# Patient Record
Sex: Male | Born: 1990
Health system: Southern US, Community
[De-identification: ages and names within clinical notes are randomized; demographics above are authoritative.]

## PROBLEM LIST (undated history)

## (undated) DIAGNOSIS — K219 Gastro-esophageal reflux disease without esophagitis: Secondary | ICD-10-CM

## (undated) DIAGNOSIS — I82409 Acute embolism and thrombosis of unspecified deep veins of unspecified lower extremity: Secondary | ICD-10-CM

## (undated) DIAGNOSIS — J45909 Unspecified asthma, uncomplicated: Secondary | ICD-10-CM

## (undated) DIAGNOSIS — C801 Malignant (primary) neoplasm, unspecified: Secondary | ICD-10-CM

## (undated) HISTORY — PX: IVC FILTER INSERTION: CATH118245

## (undated) HISTORY — DX: Unspecified asthma, uncomplicated: J45.909

## (undated) NOTE — *Deleted (*Deleted)
[facility]  [address] [place], [place] [address] Phone: [phone]  Fax: [phone]   Clinic Day:  02/06/2020  Referring physician: No ref. provider found  Chief Complaint: Edgar [name] is a 29 y.o. male with recurrent testicular cancer who is seen for 3 month assessment.  HPI: The patient was last seen in the medical oncology clinic on 11/08/2019. At that time, he denied any complaints.  Exam was unremarkable. Hematocrit was 41.5, hemoglobin 13.4, platelets 301,000, WBC 6,100. Creatinine was 1.27. Beta-HCG quant was 3. AFP was 1.1. LDH was 189. He continued Xarelto.  As his HCG, LDH, and creatinine were higher than baseline, he was to have his labs rechecked in 2 weeks.  Chest, abdomen and pelvis CT with contrast on 02/06/2020 revealed ***.  During the interim, ***   Past Medical History:  Diagnosis Date  . Asthma    AS A CHILD-NO INHALERS  . Cancer (HCC)    testicular cancer 11/2016 per pt   . DVT (deep venous thrombosis) (HCC)   . GERD (gastroesophageal reflux disease)    TUMS PRN    Past Surgical History:  Procedure Laterality Date  . ANKLE FRACTURE SURGERY Right 2004  . FINE NEEDLE ASPIRATION BIOPSY N/A 11/09/2017   Procedure: FINE NEEDLE ASPIRATION BIOPSY;  Surgeon: Renford Dills, MD;  Location: [facility] INVASIVE CV LAB;  Service: Cardiovascular;  Laterality: N/A;  . IVC FILTER INSERTION    . IVC FILTER INSERTION N/A 04/12/2018   Procedure: IVC FILTER INSERTION;  Surgeon: Renford Dills, MD;  Location: [facility] INVASIVE CV LAB;  Service: Cardiovascular;  Laterality: N/A;  . IVC FILTER REMOVAL N/A 03/23/2018   Procedure: IVC FILTER REMOVAL;  Surgeon: Renford Dills, MD;  Location: [facility] INVASIVE CV LAB;  Service: Cardiovascular;  Laterality: N/A;  . ORCHIECTOMY Left 11/26/2015   Procedure: ORCHIECTOMY;  Surgeon: Vanna Scotland, MD;  Location: [facility] ORS;  Service: Urology;  Laterality: Left;  radical/Inguinal approach  .  PERIPHERAL VASCULAR CATHETERIZATION N/A 12/16/2015   Procedure: Shelda Pal Cath Insertion;  Surgeon: Annice Needy, MD;  Location: [facility] INVASIVE CV LAB;  Service: Cardiovascular;  Laterality: N/A;  . PERIPHERAL VASCULAR THROMBECTOMY Left 10/13/2017   Procedure: PERIPHERAL VASCULAR THROMBECTOMY;  Surgeon: Renford Dills, MD;  Location: [facility] INVASIVE CV LAB;  Service: Cardiovascular;  Laterality: Left;  . WISDOM TOOTH EXTRACTION  2017    Family History  Problem Relation Age of Onset  . Skin cancer Mother   . Breast cancer Maternal Grandmother   . Colon cancer Maternal Grandfather   . Diabetes Maternal Grandfather   . Emphysema Father   . Mental illness Sister        anxiety  . Stroke Paternal Grandmother   . Prostate cancer Neg Hx   . Kidney cancer Neg Hx   . Bladder Cancer Neg Hx     Social History:  reports that he has been smoking cigarettes. He has a 4.00 pack-year smoking history. He has quit using smokeless tobacco.  His smokeless tobacco use included snuff. He reports previous drug use. Drug: Marijuana. He reports that he does not drink alcohol. He smokes 1/2 pack a day. He smokes marijuana. 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 His daughter is home schooled. The patient's mother's cell phone number is [phone]. Another contact number is [phone]. The patient lives in [place]. He started working at [employer] 40 hours a week.  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  The patient is alone*** today.  Allergies: No Known Allergies  Current Medications: Current Outpatient Medications  Medication Sig Dispense Refill  . acetaminophen (TYLENOL) 325 MG tablet Take 650 mg by mouth every 6 (six) hours as needed.    . gabapentin (NEURONTIN) 300 MG capsule TAKE 1 CAPSULE BY MOUTH EVERYDAY AT BEDTIME 30 capsule 2  . rivaroxaban (XARELTO)  20 MG TABS tablet Take 1 tablet (20 mg total) by mouth daily with supper. 30 tablet 2  . XARELTO 20 MG TABS tablet TAKE 1 TABLET BY MOUTH EVERY DAY (Patient not taking: Reported on 11/07/2019) 30 tablet 0   No current facility-administered medications for this visit.   Facility-Administered Medications Ordered in Other Visits  Medication Dose Route Frequency Provider Last Rate Last Admin  . filgrastim-sndz (ZARXIO) injection 480 mcg  480 mcg Subcutaneous Once Rosey Bath, MD      . Seward Speck [facility]) injection 480 mcg  480 mcg Subcutaneous Once Rosey Bath, MD       Review of Systems  Constitutional: Negative.  Negative for chills, diaphoresis, fever, malaise/fatigue and weight loss (up 22 lbs in past 6 months).  HENT: Negative.  Negative for congestion, ear discharge, ear pain, hearing loss, nosebleeds, sinus pain, sore throat and tinnitus.   Eyes: Negative.  Negative for blurred vision, double vision, pain, discharge and redness.  Respiratory: Negative.  Negative for cough, hemoptysis, sputum production and shortness of breath.   Cardiovascular: Positive for leg swelling (stable). Negative for chest pain, palpitations, orthopnea and PND.  Gastrointestinal: Negative.  Negative for abdominal pain, blood in stool, constipation, diarrhea, heartburn, melena, nausea and vomiting.       Eating well.  Genitourinary: Negative.  Negative for dysuria, frequency, hematuria and urgency.  Musculoskeletal: Negative.  Negative for back pain, falls, joint pain, myalgias and neck pain.  Skin: Negative.  Negative for itching and rash.  Neurological: Positive for sensory change (neuropathy in feet, stable). Negative for dizziness, tingling, speech change, focal weakness, weakness and headaches.  Endo/Heme/Allergies: Does not bruise/bleed easily.  Psychiatric/Behavioral: Negative.  Negative for depression, memory loss and suicidal ideas. The patient is not nervous/anxious and does not have  insomnia.   All other systems reviewed and are negative.  Performance status (ECOG):  0***  Vitals There were no vitals taken for this visit.   Physical Exam Vitals and nursing note reviewed.  Constitutional:      General: He is not in acute distress.    Appearance: Normal appearance. He is well-developed. He is not diaphoretic.  HENT:     Head: Normocephalic and atraumatic.     Comments: Brown hair.    Mouth/Throat:     Mouth: Mucous membranes are moist.     Pharynx: Oropharynx is clear.  Eyes:     General: No scleral icterus.    Extraocular Movements: Extraocular movements intact.     Conjunctiva/sclera: Conjunctivae normal.     Pupils: Pupils are equal, round, and reactive to light.  Cardiovascular:     Rate and Rhythm: Normal rate and regular rhythm.     Heart sounds: Normal heart sounds. No murmur heard.   Pulmonary:     Effort: Pulmonary effort is normal. No respiratory distress.     Breath sounds: Normal breath sounds. No stridor. No wheezing or rales.  Chest:     Chest wall: No tenderness.  Abdominal:     General: Bowel sounds are normal. There is no distension.     Palpations: Abdomen is soft. There is no mass.     Tenderness: There is no abdominal tenderness. There is no guarding or rebound.  Genitourinary:    Testes:        Right: Mass or swelling not present.     Comments: Left testes absent. Musculoskeletal:        General: No swelling or tenderness. Normal range of motion.     Cervical back: Normal range of motion and neck supple.  Lymphadenopathy:     Head:     Right side of head: No preauricular, posterior auricular or occipital adenopathy.     Left side of head: No preauricular, posterior auricular or occipital adenopathy.     Cervical: No cervical adenopathy.     Upper Body:     Right upper body: No supraclavicular or axillary adenopathy.     Left upper body: No supraclavicular or axillary adenopathy.     Lower Body: No right inguinal adenopathy.  No left inguinal adenopathy.  Skin:    General: Skin is warm and dry.     Coloration: Skin is not pale.  Neurological:     Mental Status: He is alert and oriented to person, place, and time. Mental status is at baseline.  Psychiatric:        Behavior: Behavior normal.        Thought Content: Thought content normal.        Judgment: Judgment normal.    No visits with results within 3 Day(s) from this visit.  Latest known visit with results is:  Infusion on 11/07/2019  Component Date Value Ref Range Status  . WBC 11/07/2019 6.1  4.0 - 10.5 K/uL Final  . RBC 11/07/2019 4.42  4.22 - 5.81 MIL/uL Final  . Hemoglobin 11/07/2019 13.4  13.0 - 17.0 g/dL Final  . HCT 16/12/9602 41.5  39 - 52 % Final  . MCV 11/07/2019 93.9  80.0 - 100.0 fL Final  . MCH 11/07/2019 30.3  26.0 - 34.0 pg Final  . MCHC 11/07/2019 32.3  30.0 - 36.0 g/dL Final  . RDW 54/11/8117 13.1  11.5 - 15.5 % Final  . Platelets 11/07/2019 301  150 - 400 K/uL Final  . nRBC 11/07/2019 0.0  0.0 - 0.2 % Final  . Neutrophils Relative % 11/07/2019 54  % Final  . Neutro Abs 11/07/2019 3.3  1.7 - 7.7 K/uL Final  . Lymphocytes Relative 11/07/2019 27  % Final  . Lymphs Abs 11/07/2019 1.6  0.7 - 4.0 K/uL Final  . Monocytes Relative 11/07/2019 11  % Final  . Monocytes Absolute 11/07/2019 0.7  0.1 - 1.0 K/uL Final  . Eosinophils Relative 11/07/2019 7  % Final  . Eosinophils Absolute 11/07/2019 0.4  0.0 - 0.5 K/uL Final  . Basophils Relative 11/07/2019 1  % Final  . Basophils Absolute 11/07/2019 0.1  0.0 - 0.1 K/uL Final  . Immature Granulocytes 11/07/2019 0  % Final  . Abs Immature Granulocytes 11/07/2019 0.02  0.00 - 0.07 K/uL Final   Performed at [facility], [address], [place], [place] [address]  . hCG Quant 11/07/2019 3  0 - 3 mIU/mL Final   Comment: (NOTE) Roche ECLIA methodology Performed At: [facility] [address] [place], [place] [id number] Jolene Schimke MD VH:[id number]   . AFP, Serum,  Tumor Marker 11/07/2019 1.1  0.0 - 8.3 ng/mL Final  Comment: (NOTE) Roche Diagnostics Electrochemiluminescence Immunoassay (ECLIA) Values obtained with different assay methods or kits cannot be used interchangeably.  Results cannot be interpreted as absolute evidence of the presence or absence of malignant disease. This test is not interpretable in pregnant females. Performed At: [facility] [address] [place], [place] [id number] Jolene Schimke MD WU:[id number]   . LDH 11/07/2019 189  98 - 192 U/L Final   Performed at [facility], [address] [place], [place] [address]  . Sodium 11/07/2019 140  135 - 145 mmol/L Final  . Potassium 11/07/2019 3.7  3.5 - 5.1 mmol/L Final  . Chloride 11/07/2019 104  98 - 111 mmol/L Final  . CO2 11/07/2019 26  22 - 32 mmol/L Final  . Glucose, Bld 11/07/2019 118* 70 - 99 mg/dL Final   Glucose reference range applies only to samples taken after fasting for at least 8 hours.  . BUN 11/07/2019 16  6 - 20 mg/dL Final  . Creatinine, Ser 11/07/2019 1.27* 0.61 - 1.24 mg/dL Final  . Calcium 30/86/5784 8.9  8.9 - 10.3 mg/dL Final  . Total Protein 11/07/2019 7.5  6.5 - 8.1 g/dL Final  . Albumin 69/62/9528 4.6  3.5 - 5.0 g/dL Final  . AST 41/32/4401 20  15 - 41 U/L Final  . ALT 11/07/2019 25  0 - 44 U/L Final  . Alkaline Phosphatase 11/07/2019 54  38 - 126 U/L Final  . Total Bilirubin 11/07/2019 0.8  0.3 - 1.2 mg/dL Final  . GFR calc non Af Amer 11/07/2019 >60  >60 mL/min Final  . GFR calc Af Amer 11/07/2019 >60  >60 mL/min Final  . Anion gap 11/07/2019 10  5 - 15 Final   Performed at [facility], [address], [place], [place] [address]    Assessment:  Edgar [name] is a 70 y.o. male with recurrent testicular cancer. He presented with a left lower extremity DVT on 09/10/2017.  He initially presented withstage IIB left testicular cancers/p left radical orchiectomy on 11/26/2015. 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 Clinical/pathologic stage is T1 N1-2 S0 M0.  He received 3 cycles of BEP(12/30/2015 - 02/24/2016). He received OnPro Neulasta with cycle #2 and #3. He declined evaluation for retroperitoneal lymph node dissection (RPLND). He declined sperm banking.  Pre surgery labson 11/20/2015 revealed an AFP of 411.9, beta-HCG 2,669, and LDH 522 (121-224).   Tumor markers have been followed: AFP was 411.9 (0-8.3) on 11/20/2015, 11.2 on 12/19/2015, 6.3 on 12/23/2015, 1.3 on 01/27/2016, 1.8 on 02/17/2016, 1.9 on 03/30/2016, 1.4 on 05/27/2016, 1.0 on 07/29/2016, 1.4 on 10/14/2016, 3.3 on 12/24/2016, 1 on 09/10/2017, 1 on 09/20/2017, 2.6 on 10/21/2017, 2.1 on 11/16/2017, 2.1 on 12/20/2017, 2.1 on 02/04/2018, 1.3 on 10/05/2018, 1.2 on 01/30/2019, 1.2 on 05/08/2019, 1.4 on 08/07/2019, and 1.1 on 11/07/2019.  Beta-HCGwas 2,669 (0-3) on 11/20/2015, 2 on 12/19/2015, 1 on 12/23/2015, <1 on 01/27/2016, <1 on 02/17/2016, <1 on 03/30/2016, <1 on 05/27/2016, <1 on 07/29/2016, <1 on 10/14/2016, and 3 on 12/24/2016, <5 on 09/11/2017, <5 on 09/20/2017, <1 on 10/21/2017, <1 on 11/16/2017, <1 on 12/20/2017, <1 on 02/04/2018, < 1 on 10/05/2018, < 1 on 01/30/2019, < 1 on 05/08/2019, < 1 on 08/07/2019, and 3 on 11/07/2019.  LDHwas 522 (98-192) on 11/20/2015, 179 on 12/19/2015, 160 on 12/23/2015, 202 on 01/27/2016, 156 on 02/10/2016, 161 on 02/17/2016, 172 on 03/30/2016,  178 on 05/27/2016, 150 on 07/29/2016, 152 on 10/14/2016, 145 on 12/24/2016, 1557 on 09/11/2017, 916 on 09/20/2017, 144 on 10/21/2017, 158 on 11/16/2017, 203 on 12/20/2017, 151 on 12/27/2017, 133 on 02/04/2018,130 on 10/05/2018, 129 on 01/30/2019, 147 on 05/08/2019, 142 on 08/07/2019, 189 on 11/07/2019, and 144 on 02/06/2020.  Chest, abdomen, and pelvic CTon 09/11/2017 revealed enlarged and morphologically  abnormal 4.3 x 4.9 cm left pelvic sidewall and retroperitoneal lymph nodes (measuring up to 2.9 cm). 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 There was anasarca/swelling of the left lower extremity. There was no evidence of metastatic disease in the chest.  Head MRIon 09/13/2017 revealed no evidence of metastatic disease.  He has a left lower extremity DVT. Bilateral lower extremity duplexon 09/10/2017 revealed evidence of obstruction in the left CFV, SFJ, and proximal femoral vein. He is on Xarelto.  He received 4 cycles of TIPchemotherapy(09/21/2017 - 12/27/2017). Cycle #1 was administered at [facility]. Cycles #2 and 3 were administered at [facility]. Both cycles 1 and 2 were complicated by lower extremity skin lesions on recovery of counts. 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 Patient received daily Zarxioinjections from 01/03/2018 - 01/12/2018.  PET scanon 02/03/2018 revealed left common and external iliac adenopathywithonly low-grade activity, less than that of the blood pool. The dominant lymph node is an external iliac node measuring 1.8 x 2.4cm(previously 2.3x 3.2cm).There was no new or enlarging adenopathy.There was low-grade activity along the left inguinal ring region near the orchectomy site, thought to be postoperative and not due to local malignancy.  He underwentrobotic assisted laparoscopic retroperitoneal lymph node dissectionand robotic inguinofemoral lymph node dissection on 03/05/2020at [facility]. Pathologyrevealed a left common iliac node with a large necrotic tumor deposit consistent with treatment effect (no residual viable tumor). There were 3 interaortocaval lymph nodes,9 paracaval lymph nodes, and3 periaortic lymph nodes negative for malignancy.   Chest, abdomen, and pelvis CT with contrast on 02/07/2019 revealed no acute intrathoracic, abdominal, or pelvic pathology.  There was no evidence of metastatic disease in the chest.  There was a single enlarged left external iliac chain lymph node similar in  size to the prior CT with no other adenopathy. There was interval resolution of the postoperative changes of the left groin with linear scarring with no fluid collection.  Chest, abdomen, and pelvis CT with on 08/07/2019 revealed no evidence of metastatic disease in the chest, abdomen or pelvis. The previously described left pelvic sidewall fluid density focus had resolved.  Audiogramon 10/27/2017 revealed hearing within normal limits.  He underwent IVC filterplacement, percutaneous angioplastyand stentplacementof the left common iliac vein and left external iliac vein on 10/13/2017.He underwent an IVC filter removalon 01/08/2020and replacementon 04/12/2018. He remains on Xarelto.  Hypercoagulable work-upon 10/12/2017 negative for the following:Factor V Leiden, prothrombin gene mutation,lupus anticoagulant, anticardiolipin antibodies, beta-2 glycoprotein, protein C activity/antigen, protein S activity/antigen. ATIIIwas54% (75-120) on heparin.  Symptomatically, ***  Plan: 1.   Review labs from 02/06/2020.   2.Recurrent testicular cancer Patient completed 4 cycles of TIP chemotherapy.  He underwent retroperitoneal lymph node dissection on03/07/2018. He had 1 necrotic lymph node with no viable tumor. Chest, abdomen, pelvic CT scan on 08/07/2019 was personally reviewed.  Agree with radiology interpretation.    No evidence of recurrent disease.             AFP, beta-HCG, and LDH are normal.   HCG has increased from <  1 to 3 (ULN).  Discuss ongoing follow-up:   Year 2: H&P and markers every 3 months and scans every 6 months.  Schedule follow-up CT scans in 01/2020. 3. LEFT lower extremity  DVT Patient is as/preplacement of the IVC filter. He is at risk for thrombosis. Continue Xarelto. 4.Chemotherapy-induced neuropathy Patient has a stable neuropathy in his feet. Continue gabapentin. 5.   Chest, abdomen and pelvic CT scans on 02/06/2020. 6.   RTC on 11/23 as scheduled for labs. 7.   RTC on 02/07/2020 for MD assessment as scheduled.  Addendum: Patient will have his creatinine and tumor markers rechecked in 2 weeks (11/22/2019).  I discussed the assessment and treatment plan with the patient.  The patient was provided an opportunity to ask questions and all were answered.  The patient agreed with the plan and demonstrated an understanding of the instructions.  The patient was advised to call back if the symptoms worsen or if the condition fails to improve as anticipated.  I provided *** minutes of face-to-face time during this this encounter and > 50% was spent counseling as documented under my assessment and plan.  Rosey Bath, MD, PhD 11/07/2019, 5:00PM  I, Pietro Cassis, am acting as Neurosurgeon for [employer]. Merlene Pulling, MD, PhD.  I, [name]. Merlene Pulling, MD, have reviewed the above documentation for accuracy and completeness, and I agree with the above.

---
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
## 2010-12-18 ENCOUNTER — Emergency Department: Payer: Self-pay | Admitting: Emergency Medicine

## 2015-03-17 HISTORY — PX: WISDOM TOOTH EXTRACTION: SHX21

## 2015-11-20 ENCOUNTER — Ambulatory Visit
Admission: RE | Admit: 2015-11-20 | Discharge: 2015-11-20 | Disposition: A | Discharge status: Home / Self Care | Payer: BLUE CROSS/BLUE SHIELD | Source: Ambulatory Visit | Attending: Family Medicine | Admitting: Family Medicine

## 2015-11-20 ENCOUNTER — Ambulatory Visit (INDEPENDENT_AMBULATORY_CARE_PROVIDER_SITE_OTHER): Payer: BLUE CROSS/BLUE SHIELD | Admitting: Urology

## 2015-11-20 ENCOUNTER — Other Ambulatory Visit: Payer: Self-pay | Admitting: Family Medicine

## 2015-11-20 ENCOUNTER — Encounter: Payer: Self-pay | Admitting: Urology

## 2015-11-20 VITALS — BP 131/82 | HR 96 | Ht 75.0 in | Wt 272.0 lb

## 2015-11-20 DIAGNOSIS — N433 Hydrocele, unspecified: Secondary | ICD-10-CM | POA: Diagnosis not present

## 2015-11-20 DIAGNOSIS — N509 Disorder of male genital organs, unspecified: Secondary | ICD-10-CM

## 2015-11-20 DIAGNOSIS — N448 Other noninflammatory disorders of the testis: Secondary | ICD-10-CM | POA: Diagnosis not present

## 2015-11-20 DIAGNOSIS — N50812 Left testicular pain: Secondary | ICD-10-CM

## 2015-11-20 DIAGNOSIS — N50819 Testicular pain, unspecified: Secondary | ICD-10-CM

## 2015-11-20 DIAGNOSIS — R3129 Other microscopic hematuria: Secondary | ICD-10-CM | POA: Diagnosis not present

## 2015-11-20 LAB — URINALYSIS, COMPLETE
Bilirubin, UA: NEGATIVE
Glucose, UA: NEGATIVE
Ketones, UA: NEGATIVE
Leukocytes, UA: NEGATIVE
NITRITE UA: NEGATIVE
PH UA: 5.5 (ref 5.0–7.5)
Protein, UA: NEGATIVE
Specific Gravity, UA: >1.03 — ABNORMAL HIGH (ref 1.005–1.030)
UUROB: 0.2 mg/dL (ref 0.2–1.0)

## 2015-11-20 LAB — MICROSCOPIC EXAMINATION: Bacteria, UA: NONE SEEN

## 2015-11-20 MED ORDER — DOXYCYCLINE HYCLATE 100 MG PO CAPS: 100.0000 mg | ORAL_CAPSULE | Freq: Two times a day (BID) | ORAL | 0 refills | Status: DC

## 2015-11-20 MED ORDER — HYDROCODONE-ACETAMINOPHEN 5-325 MG PO TABS: 1.0000 | ORAL_TABLET | Freq: Four times a day (QID) | ORAL | Status: DC | PRN

## 2015-11-20 MED ORDER — CEFTRIAXONE SODIUM 1 G IJ SOLR
1.0000 g | Freq: Once | INTRAMUSCULAR | Status: AC
  Administered 2015-11-20: 1 g via INTRAMUSCULAR

## 2015-11-20 NOTE — Progress Notes (Addendum)
11/20/2015 2:07 PM   Edgar [name] 1990-04-14 [id number]  Referring provider: No referring provider defined for this encounter.  Chief Complaint  Patient presents with  . Testicle Pain    New Patient    HPI: 25 yo M presents today as referral with pain in his left testicle x 2 weeks.  He reports that the pain continue to worsen.  No associated nausea or vomiting.  He does have some lower abdominal pain.  Pain is constant.  He has taken 2 hydrocodone which did help some.    No associated urinary issues.  No dysuria or gross hematuria. He is sexually active.  No urethral discharge.  Denies history of STIs.   No fevers or chills.  This happened earlier this year around March, lasted 2 days and then resolved.   Scrotal ultrasound shows bilateral hydroceles. Left testicle heterogeneous and enlarged.  No family history of testicular cancer.     PMH: Past Medical History:  Diagnosis Date  . Asthma     Surgical History: Past Surgical History:  Procedure Laterality Date  . ANKLE FRACTURE SURGERY Right 2004    Home Medications:    Medication List       Accurate as of 11/20/15  2:07 PM. Always use your most recent med list.          doxycycline 100 MG capsule Commonly known as:  VIBRAMYCIN Take 1 capsule (100 mg total) by mouth every 12 (twelve) hours.       Allergies: No Known Allergies  Family History: Family History  Problem Relation Age of Onset  . Prostate cancer Neg Hx   . Kidney cancer Neg Hx   . Bladder Cancer Neg Hx     Social History:  reports that he has been smoking Cigarettes.  He has been smoking about 0.50 packs per day. He has quit using smokeless tobacco. His smokeless tobacco use included Snuff. He reports that he uses drugs, including Marijuana. He reports that he does not drink alcohol.  ROS: UROLOGY Frequent Urination?: Yes Hard to postpone urination?: No Burning/pain with urination?: No Get up at night to urinate?: No Leakage  of urine?: No Urine stream starts and stops?: No Trouble starting stream?: No Do you have to strain to urinate?: No Blood in urine?: No Urinary tract infection?: No Sexually transmitted disease?: No Injury to kidneys or bladder?: No Painful intercourse?: No Weak stream?: No Erection problems?: No Penile pain?: No  Gastrointestinal Nausea?: No Vomiting?: No Indigestion/heartburn?: No Diarrhea?: No Constipation?: No  Constitutional Fever: No Night sweats?: Yes Weight loss?: No Fatigue?: Yes  Skin Skin rash/lesions?: No Itching?: No  Eyes Blurred vision?: No Double vision?: No  Ears/Nose/Throat Sore throat?: No Sinus problems?: No  Hematologic/Lymphatic Swollen glands?: No Easy bruising?: No  Cardiovascular Leg swelling?: No Chest pain?: No  Respiratory Cough?: No Shortness of breath?: No  Endocrine Excessive thirst?: Yes  Musculoskeletal Back pain?: No Joint pain?: No  Neurological Headaches?: Yes Dizziness?: No  Psychologic Depression?: No Anxiety?: No  Physical Exam: BP 131/82   Pulse 96   Ht 6' 3"  (1.905 m)   Wt 272 lb (123.4 kg)   BMI 34.00 kg/m   Constitutional:  Alert and oriented, No acute distress.  Smell of cigarettes. HEENT: [place] AT, moist mucus membranes.  Trachea midline, no masses. Cardiovascular: No clubbing, cyanosis, or edema. Respiratory: Normal respiratory effort, no increased work of breathing. GI: Abdomen is soft, nontender, nondistended, no abdominal masses GU: Circumcised phallus. Softball sized left  hydrocele with some slight erythema of the overlying skin without induration or fluctuance. Unable to palpate left testicle. Right testicle with small hydrocele, palpable without tenderness or masses. Skin: No rashes, bruises or suspicious lesions. Lymph: No cervical or inguinal adenopathy. Neurologic: Grossly intact, no focal deficits, moving all 4 extremities. Psychiatric: Normal mood and affect.  Laboratory  Data: Basic Metabolic Panel (BMP) (47/65/4650 8:57 AM) Basic Metabolic Panel (BMP) (35/46/5681 8:57 AM)  Component Value Ref Range  Glucose 111 (H) 70 - 110 mg/dL  Sodium 139 136 - 145 mmol/L  Potassium 3.9 3.6 - 5.1 mmol/L  Chloride 105 97 - 109 mmol/L  Carbon Dioxide (CO2) 27.1 22.0 - 32.0 mmol/L  Calcium 9.7 8.7 - 10.3 mg/dL  Urea Nitrogen (BUN) 12 7 - 25 mg/dL  Creatinine 0.9 0.7 - 1.3 mg/dL  Glomerular Filtration Rate (eGFR), MDRD Estimate 101 >60 mL/min/1.73sq m  BUN/Crea Ratio 13.3 6.0 - 20.0  Anion Gap w/K 10.8 6.0 - 16.0    CBC w/auto Differential (5 Part) (11/20/2015 8:57 AM) CBC w/auto Differential (5 Part) (11/20/2015 8:57 AM)  Component Value Ref Range  WBC (White Blood Cell Count) 7.7 4.1 - 10.2 10^3/uL  RBC (Red Blood Cell Count) 5.29 4.69 - 6.13 10^6/uL  Hemoglobin 15.7 14.1 - 18.1 gm/dL  Hematocrit 47.7 40.0 - 52.0 %  MCV (Mean Corpuscular Volume) 90.2 80.0 - 100.0 fl  MCH (Mean Corpuscular Hemoglobin) 29.7 27.0 - 31.2 pg  MCHC (Mean Corpuscular Hemoglobin Concentration) 32.9 32.0 - 36.0 gm/dL  Platelet Count 378 150 - 450 10^3/uL  RDW-CV (Red Cell Distribution Width) 12.6 11.6 - 14.8 %  MPV (Mean Platelet Volume) 8.8 (L) 9.4 - 12.4 fl  Neutrophils 4.63 1.50 - 7.80 10^3/uL  Lymphocytes 1.76 1.00 - 3.60 10^3/uL  Monocytes 1.03 0.00 - 1.50 10^3/uL  Eosinophils 0.26 0.00 - 0.55 10^3/uL  Basophils 0.03 0.00 - 0.09 10^3/uL  Neutrophil % 59.8 32.0 - 70.0 %  Lymphocyte % 22.8 10.0 - 50.0 %  Monocyte % 13.3 (H) 4.0 - 13.0 %  Eosinophil % 3.4 1.0 - 5.0 %  Basophil% 0.4 0.0 - 2.0 %  Immature Granulocyte % 0.3 <=0.7 %  Immature Granulocyte Count 0.02 <=0.06 10^3/L    Urinalysis UA today shows 3-10 blood cells per high-power field, calcium oxalate crystals, otherwise negative.  Pertinent Imaging: CLINICAL DATA:  Testicular swelling.  EXAM: SCROTAL ULTRASOUND  DOPPLER ULTRASOUND OF THE TESTICLES  TECHNIQUE: Complete ultrasound examination of the  testicles, epididymis, and other scrotal structures was performed. Color and spectral Doppler ultrasound were also utilized to evaluate blood flow to the testicles.  COMPARISON:  CT 02/27/2007.  FINDINGS: Right testicle  Measurements: 4.0 x 2.0 x 2.7 cm. No mass or microlithiasis visualized.  Left testicle  Measurements: 10.5 x 6.9 x 5.9 cm. Left testicle is enlarged with a heterogeneous echotexture with increased color flow. Orchitis could present in this fashion. Diffuse left testicular tumor cannot be excluded.  Right epididymis:  4 mm epididymal cyst.  Left epididymis: Mild prominence of the left epididymis is noted. Heterogeneous echotexture is noted. Epididymitis cannot be excluded.  Hydrocele:  Large left hydrocele.  Small right hydrocele.  Varicocele:  None visualized.  Pulsed Doppler interrogation of both testes demonstrates normal low resistance arterial and venous waveforms bilaterally.  Bilateral scrotal wall thickening, left side greater right .  IMPRESSION: 1. Diffuse left testicular enlargement with heterogeneous echotexture and increased color flow. Orchitis could present in this fashion . Diffuse left testicular tumor cannot be excluded. No evidence of  torsion.  2. Prominence of the left epididymis with heterogeneous echotexture. Left epididymitis cannot be excluded.  3. Large left hydrocele. Small right hydrocele. Bilateral scrotal wall thickening, left side greater right.   Electronically Signed   By: Marcello Moores  Register   On: 11/20/2015 10:43  Scrotal ultrasound procedure reviewed today.  Assessment & Plan:  25 year old male with 2 weeks of severe left scrotal pain with large reactive left hydrocele and testicular heterogeneity and enlargement of the testicle and epididymis ultrasound. We discussed today that differential diagnosis is most consistent with severe left epididymoorchitis although unable to rule out underying left  testicular tumor.  1. Testicle pain Discussed caudal support, alternating ice, and we will prescribe pain meds as needed (#10 viocdin to use sparingly) - Urinalysis, Complete  2. Testicular lesion We will treat for presumed left epididymoorchitis with ceftriaxone here today in the office in 2 weeks of doxycycline Plan for close follow-up in 1 week to reassess Tumor markers sent today, if abnormal, will pursue left inguinal orchiectomy Importance close reliable follow-up was stressed today Warning symptoms reviewed  - AFP tumor marker - Lactate dehydrogenase - Beta HCG, Quant (tumor marker)  3. Microscopic hematuria Incisional microscopic hematuria, question urethritis We'll recheck at next visit  Return in about 1 week (around 11/27/2015) for (overbook as needed).  [name], MD  [facility] [address], [place], [place] [address] [phone]   Addendum 11/21/15:  Tumor markers returned elevated, LDH and AFP (beta hCG still pending) highly concerning for testicular cancer. Called patient to discuss these findings.  I have recommended left inguinal orchiectomy to be scheduled at my earliest available surgical tape, likely next week. We discussed the procedure at length today and the postoperative recovery. Risk and benefits were reviewed. All of his questions were answered.  We did also discuss fertility. He has 1 daughter and is currently engaged and will likely want additional biological children in the future. Pending pathology results, staging, and need for additional treatment, may consider sperm banking in the future.  [name], MD

## 2015-11-20 NOTE — Progress Notes (Signed)
IM Injection  Patient is present today for an IM Injection for treatment of possible orchitis. Drug: Rocephin Dose:1GM Location:Left upper outer buttocks Lot: [id number] Exp:12/2017 Patient tolerated well, no complications were noted  Preformed by: Fonnie Jarvis, CMA

## 2015-11-21 ENCOUNTER — Other Ambulatory Visit: Payer: Self-pay | Admitting: Radiology

## 2015-11-21 ENCOUNTER — Telehealth: Payer: Self-pay | Admitting: Radiology

## 2015-11-21 DIAGNOSIS — N509 Disorder of male genital organs, unspecified: Secondary | ICD-10-CM

## 2015-11-21 LAB — LACTATE DEHYDROGENASE: LDH: 522 IU/L — AB (ref 121–224)

## 2015-11-21 LAB — BETA HCG QUANT (REF LAB): HCG QUANT: 2669 m[IU]/mL — AB (ref 0–3)

## 2015-11-21 LAB — AFP TUMOR MARKER: AFP TUMOR MARKER: 411.9 ng/mL — AB (ref 0.0–8.3)

## 2015-11-21 NOTE — Telephone Encounter (Signed)
Notified patient of Edgar [name] scheduled with Dr. Erlene Quan on 11/26/2015, preadmission testing phone interview on 11/22/2015 between 9 and 1 PM, and to call day prior to surgery for arrival time to SDS. Patient voices understanding.

## 2015-11-22 ENCOUNTER — Encounter
Admission: RE | Admit: 2015-11-22 | Discharge: 2015-11-22 | Disposition: A | Discharge status: Home / Self Care | Payer: BLUE CROSS/BLUE SHIELD | Source: Ambulatory Visit | Attending: Urology | Admitting: Urology

## 2015-11-22 HISTORY — DX: Gastro-esophageal reflux disease without esophagitis: K21.9

## 2015-11-22 NOTE — Patient Instructions (Signed)
  Your procedure is scheduled on: 11-26-15 Report to Same Day Surgery 2nd floor medical mall To find out your arrival time please call [phone] between 1PM - 3PM on 11-25-15  Remember: Instructions that are not followed completely may result in serious medical risk, up to and including death, or upon the discretion of your surgeon and anesthesiologist your surgery may need to be rescheduled.    _x___ 1. Do not eat food or drink liquids after midnight. No gum chewing or hard candies.     __x__ 2. No Alcohol for 24 hours before or after surgery.   __x__3. No Smoking for 24 prior to surgery.   ____  4. Bring all medications with you on the day of surgery if instructed.    __x__ 5. Notify your doctor if there is any change in your medical condition     (cold, fever, infections).     Do not wear jewelry, make-up, hairpins, clips or nail polish.  Do not wear lotions, powders, or perfumes. You may wear deodorant.  Do not shave 48 hours prior to surgery. Men may shave face and neck.  Do not bring valuables to the hospital.    [facility] is not responsible for any belongings or valuables.               Contacts, dentures or bridgework may not be worn into surgery.  Leave your suitcase in the car. After surgery it may be brought to your room.  For patients admitted to the hospital, discharge time is determined by your treatment team.   Patients discharged the day of surgery will not be allowed to drive home.    Please read over the following fact sheets that you were given:   [facility] Preparing for Surgery and or MRSA Information   ____ Take these medicines the morning of surgery with A SIP OF WATER:    1. NONE  2.  3.  4.  5.  6.  ____ Fleet Enema (as directed)   ____ Use CHG Soap or sage wipes as directed on instruction sheet   ____ Use inhalers on the day of surgery and bring to hospital day of surgery  ____ Stop metformin 2 days prior to surgery    ____ Take 1/2 of  usual insulin dose the night before surgery and none on the morning of surgery.   ____ Stop aspirin or coumadin, or plavix  _x__ Stop Anti-inflammatories such as Advil, Aleve, Ibuprofen, Motrin, Naproxen,          Naprosyn, Goodies powders or aspirin products. Ok to take Tylenol.   ____ Stop supplements until after surgery.    ____ Bring C-Pap to the hospital.

## 2015-11-26 ENCOUNTER — Encounter
Admission: RE | Disposition: A | Discharge status: Home / Self Care | Payer: Self-pay | Source: Ambulatory Visit | Attending: Urology

## 2015-11-26 ENCOUNTER — Ambulatory Visit
Admission: RE | Admit: 2015-11-26 | Discharge: 2015-11-26 | Disposition: A | Discharge status: Home / Self Care | Payer: BLUE CROSS/BLUE SHIELD | Source: Ambulatory Visit | Attending: Urology | Admitting: Urology

## 2015-11-26 ENCOUNTER — Encounter: Payer: Self-pay | Admitting: *Deleted

## 2015-11-26 ENCOUNTER — Ambulatory Visit: Payer: BLUE CROSS/BLUE SHIELD | Admitting: Anesthesiology

## 2015-11-26 DIAGNOSIS — N433 Hydrocele, unspecified: Secondary | ICD-10-CM | POA: Diagnosis not present

## 2015-11-26 DIAGNOSIS — R3129 Other microscopic hematuria: Secondary | ICD-10-CM | POA: Diagnosis not present

## 2015-11-26 DIAGNOSIS — K219 Gastro-esophageal reflux disease without esophagitis: Secondary | ICD-10-CM | POA: Insufficient documentation

## 2015-11-26 DIAGNOSIS — N509 Disorder of male genital organs, unspecified: Secondary | ICD-10-CM

## 2015-11-26 DIAGNOSIS — J45909 Unspecified asthma, uncomplicated: Secondary | ICD-10-CM | POA: Diagnosis not present

## 2015-11-26 DIAGNOSIS — C6292 Malignant neoplasm of left testis, unspecified whether descended or undescended: Secondary | ICD-10-CM | POA: Insufficient documentation

## 2015-11-26 DIAGNOSIS — F17210 Nicotine dependence, cigarettes, uncomplicated: Secondary | ICD-10-CM | POA: Diagnosis not present

## 2015-11-26 DIAGNOSIS — C6290 Malignant neoplasm of unspecified testis, unspecified whether descended or undescended: Secondary | ICD-10-CM | POA: Insufficient documentation

## 2015-11-26 DIAGNOSIS — N5089 Other specified disorders of the male genital organs: Secondary | ICD-10-CM | POA: Diagnosis present

## 2015-11-26 HISTORY — PX: ORCHIECTOMY: SHX2116

## 2015-11-26 LAB — URINE DRUG SCREEN, QUALITATIVE (ARMC ONLY)
Amphetamines, Ur Screen: NOT DETECTED
BARBITURATES, UR SCREEN: NOT DETECTED
BENZODIAZEPINE, UR SCRN: NOT DETECTED
CANNABINOID 50 NG, UR ~~LOC~~: POSITIVE — AB
Cocaine Metabolite,Ur ~~LOC~~: NOT DETECTED
MDMA (Ecstasy)Ur Screen: NOT DETECTED
Methadone Scn, Ur: NOT DETECTED
Opiate, Ur Screen: NOT DETECTED
PHENCYCLIDINE (PCP) UR S: NOT DETECTED
Tricyclic, Ur Screen: NOT DETECTED

## 2015-11-26 SURGERY — ORCHIECTOMY
Anesthesia: Choice | Laterality: Left | Wound class: Clean Contaminated

## 2015-11-26 MED ORDER — HYDROMORPHONE HCL 1 MG/ML IJ SOLN
INTRAMUSCULAR | Status: AC
  Filled 2015-11-26: qty 1

## 2015-11-26 MED ORDER — CEFAZOLIN SODIUM-DEXTROSE 2-4 GM/100ML-% IV SOLN
2.0000 g | INTRAVENOUS | Status: AC
  Administered 2015-11-26: 2 g via INTRAVENOUS

## 2015-11-26 MED ORDER — ROCURONIUM BROMIDE 100 MG/10ML IV SOLN
INTRAVENOUS | Status: DC | PRN
  Administered 2015-11-26 (×2): 10 mg via INTRAVENOUS
  Administered 2015-11-26: 50 mg via INTRAVENOUS

## 2015-11-26 MED ORDER — HYDROCODONE-ACETAMINOPHEN 5-325 MG PO TABS: 1.0000 | ORAL_TABLET | Freq: Four times a day (QID) | ORAL | 0 refills | Status: DC | PRN

## 2015-11-26 MED ORDER — PROPOFOL 10 MG/ML IV BOLUS
INTRAVENOUS | Status: DC | PRN
  Administered 2015-11-26: 80 mg via INTRAVENOUS
  Administered 2015-11-26: 200 mg via INTRAVENOUS

## 2015-11-26 MED ORDER — ONDANSETRON HCL 4 MG/2ML IJ SOLN
INTRAMUSCULAR | Status: DC | PRN
  Administered 2015-11-26: 4 mg via INTRAVENOUS

## 2015-11-26 MED ORDER — FENTANYL CITRATE (PF) 100 MCG/2ML IJ SOLN
INTRAMUSCULAR | Status: AC
  Filled 2015-11-26: qty 2

## 2015-11-26 MED ORDER — MIDAZOLAM HCL 2 MG/2ML IJ SOLN
INTRAMUSCULAR | Status: DC | PRN
  Administered 2015-11-26: 2 mg via INTRAVENOUS

## 2015-11-26 MED ORDER — ONDANSETRON HCL 4 MG/2ML IJ SOLN: 4.0000 mg | Freq: Once | INTRAMUSCULAR | Status: DC | PRN

## 2015-11-26 MED ORDER — BUPIVACAINE HCL 0.5 % IJ SOLN
INTRAMUSCULAR | Status: DC | PRN
Start: 2015-11-26 — End: 2015-11-26
  Administered 2015-11-26: 19 mL
  Administered 2015-11-26: 10 mL

## 2015-11-26 MED ORDER — FENTANYL CITRATE (PF) 100 MCG/2ML IJ SOLN
INTRAMUSCULAR | Status: AC
  Administered 2015-11-26: 25 ug via INTRAVENOUS
  Filled 2015-11-26: qty 2

## 2015-11-26 MED ORDER — DOCUSATE SODIUM 100 MG PO CAPS: 100.0000 mg | ORAL_CAPSULE | Freq: Two times a day (BID) | ORAL | 0 refills | Status: DC

## 2015-11-26 MED ORDER — FAMOTIDINE 20 MG PO TABS
20.0000 mg | ORAL_TABLET | Freq: Once | ORAL | Status: AC
  Administered 2015-11-26: 20 mg via ORAL

## 2015-11-26 MED ORDER — HYDROCODONE-ACETAMINOPHEN 5-325 MG PO TABS
1.0000 | ORAL_TABLET | ORAL | Status: DC | PRN
  Administered 2015-11-26: 1 via ORAL

## 2015-11-26 MED ORDER — FENTANYL CITRATE (PF) 100 MCG/2ML IJ SOLN
25.0000 ug | INTRAMUSCULAR | Status: DC | PRN
  Administered 2015-11-26 (×4): 25 ug via INTRAVENOUS

## 2015-11-26 MED ORDER — FENTANYL CITRATE (PF) 100 MCG/2ML IJ SOLN
INTRAMUSCULAR | Status: DC | PRN
Start: 2015-11-26 — End: 2015-11-26
  Administered 2015-11-26 (×2): 100 ug via INTRAVENOUS
  Administered 2015-11-26 (×2): 50 ug via INTRAVENOUS
  Administered 2015-11-26: 100 ug via INTRAVENOUS

## 2015-11-26 MED ORDER — LIDOCAINE HCL (CARDIAC) 20 MG/ML IV SOLN
INTRAVENOUS | Status: DC | PRN
  Administered 2015-11-26: 40 mg via INTRAVENOUS

## 2015-11-26 MED ORDER — KETOROLAC TROMETHAMINE 30 MG/ML IJ SOLN
INTRAMUSCULAR | Status: DC | PRN
  Administered 2015-11-26: 30 mg via INTRAVENOUS

## 2015-11-26 MED ORDER — HYDROCODONE-ACETAMINOPHEN 5-325 MG PO TABS
ORAL_TABLET | ORAL | Status: AC
  Filled 2015-11-26: qty 1

## 2015-11-26 MED ORDER — SUGAMMADEX SODIUM 500 MG/5ML IV SOLN
INTRAVENOUS | Status: DC | PRN
  Administered 2015-11-26: 240 mg via INTRAVENOUS

## 2015-11-26 MED ORDER — BUPIVACAINE HCL (PF) 0.5 % IJ SOLN
INTRAMUSCULAR | Status: AC
  Filled 2015-11-26: qty 30

## 2015-11-26 MED ORDER — LACTATED RINGERS IV SOLN
INTRAVENOUS | Status: DC
  Administered 2015-11-26 (×2): via INTRAVENOUS

## 2015-11-26 MED ORDER — CEFAZOLIN SODIUM-DEXTROSE 2-4 GM/100ML-% IV SOLN
INTRAVENOUS | Status: AC
  Administered 2015-11-26: 2 g via INTRAVENOUS
  Filled 2015-11-26: qty 100

## 2015-11-26 MED ORDER — FAMOTIDINE 20 MG PO TABS
ORAL_TABLET | ORAL | Status: AC
  Administered 2015-11-26: 20 mg via ORAL
  Filled 2015-11-26: qty 1

## 2015-11-26 MED ORDER — HYDROMORPHONE HCL 1 MG/ML IJ SOLN
0.2500 mg | INTRAMUSCULAR | Status: DC | PRN
  Administered 2015-11-26 (×4): 0.5 mg via INTRAVENOUS

## 2015-11-26 SURGICAL SUPPLY — 50 items
APPLIER CLIP 55.6 M/L HEMOLOK (INSTRUMENTS) IMPLANT
BLADE SURG 15 STRL LF DISP TIS (BLADE) ×1 IMPLANT
BLADE SURG 15 STRL SS (BLADE) ×1
CANISTER SUCT 1200ML W/VALVE (MISCELLANEOUS) ×2 IMPLANT
CHLORAPREP W/TINT 26ML (MISCELLANEOUS) ×2 IMPLANT
CLIP TI MEDIUM 6 (CLIP) IMPLANT
CNTNR SPEC 2.5X3XGRAD LEK (MISCELLANEOUS)
CONT SPEC 4OZ STER OR WHT (MISCELLANEOUS)
CONTAINER SPEC 2.5X3XGRAD LEK (MISCELLANEOUS) IMPLANT
DRAIN PENROSE 1/4X12 LTX (DRAIN) ×2 IMPLANT
DRAIN PENROSE 5/8X12 LTX STRL (DRAIN) IMPLANT
DRAPE LAPAROTOMY 77X122 PED (DRAPES) ×2 IMPLANT
DRESSING TELFA 4X3 1S ST N-ADH (GAUZE/BANDAGES/DRESSINGS) IMPLANT
DRSG TEGADERM 4X4.75 (GAUZE/BANDAGES/DRESSINGS) IMPLANT
ELECT REM PT RETURN 9FT ADLT (ELECTROSURGICAL) ×2
ELECTRODE REM PT RTRN 9FT ADLT (ELECTROSURGICAL) ×1 IMPLANT
GAUZE FLUFF 18X24 1PLY STRL (GAUZE/BANDAGES/DRESSINGS) ×2 IMPLANT
GLOVE BIO SURGEON STRL SZ 6.5 (GLOVE) ×2 IMPLANT
GLOVE BIOGEL PI IND STRL 6.5 (GLOVE) ×1 IMPLANT
GLOVE BIOGEL PI INDICATOR 6.5 (GLOVE) ×1
GOWN STRL REUS W/ TWL LRG LVL3 (GOWN DISPOSABLE) ×2 IMPLANT
GOWN STRL REUS W/TWL LRG LVL3 (GOWN DISPOSABLE) ×2
KIT RM TURNOVER STRD PROC AR (KITS) ×2 IMPLANT
LABEL OR SOLS (LABEL) ×2 IMPLANT
LIQUID BAND (GAUZE/BANDAGES/DRESSINGS) ×2 IMPLANT
NEEDLE FILTER BLUNT 18X 1/2SAF (NEEDLE) ×1
NEEDLE FILTER BLUNT 18X1 1/2 (NEEDLE) ×1 IMPLANT
PACK BASIN MINOR ARMC (MISCELLANEOUS) ×2 IMPLANT
PREP PVP WINGED SPONGE (MISCELLANEOUS) IMPLANT
SPONGE KITTNER 5P (MISCELLANEOUS) IMPLANT
SPONGE LAP 18X18 5 PK (GAUZE/BANDAGES/DRESSINGS) ×2 IMPLANT
STRIP CLOSURE SKIN 1/2X4 (GAUZE/BANDAGES/DRESSINGS) IMPLANT
SUPPORETR ATHLETIC LG (MISCELLANEOUS) ×1 IMPLANT
SUPPORTER ATHLETIC LG (MISCELLANEOUS) ×2
SUT CHROMIC GUT BROWN 0 54 (SUTURE) IMPLANT
SUT CHROMIC GUT BROWN 0 54IN (SUTURE)
SUT MNCRL 4-0 (SUTURE) ×2
SUT MNCRL 4-0 27XMFL (SUTURE) ×2
SUT SILK 0 SH 30 (SUTURE) IMPLANT
SUT SILK 2 0 SH (SUTURE) ×2 IMPLANT
SUT VIC AB 3-0 SH 27 (SUTURE) ×2
SUT VIC AB 3-0 SH 27X BRD (SUTURE) ×2 IMPLANT
SUT VIC AB 4-0 FS2 27 (SUTURE) ×2 IMPLANT
SUT VIC AB 4-0 SH 27 (SUTURE) ×2
SUT VIC AB 4-0 SH 27XANBCTRL (SUTURE) ×2 IMPLANT
SUT VICRYL 0 TIES 12 18 (SUTURE) ×2 IMPLANT
SUTURE MNCRL 4-0 27XMF (SUTURE) ×2 IMPLANT
SYR 20CC LL (SYRINGE) ×2 IMPLANT
SYRINGE 10CC LL (SYRINGE) ×2 IMPLANT
WATER STERILE IRR 1000ML POUR (IV SOLUTION) ×2 IMPLANT

## 2015-11-26 NOTE — Discharge Instructions (Signed)
Orchiectomy, Care After °Refer to this sheet in the next few weeks. These instructions provide you with information on caring for yourself after your procedure. Your health care provider may also give you more specific instructions. Your treatment has been planned according to current medical practices, but problems sometimes occur. Call your health care provider if you have any problems or questions after your procedure. °WHAT TO EXPECT AFTER THE PROCEDURE °A sterile dressing will be applied to the incision site. You may have a scrotal support. This elevates the scrotum, thereby relieving pressure on the surgical site. In those cases where the scrotal support irritates the incision site, you may be better with the support removed. It is okay if the dressing comes off, especially at night. Air will help a scab to form, which will eliminate the need for dressings during the day. °HOME CARE INSTRUCTIONS °· Your sterile dressing may be changed once per day or as instructed by your health care provider. If the dressing sticks to your incision site, you may use warm, soapy water or hydrogen peroxide to dampen the bandage. This will loosen the bandage from your skin so that it may be removed. °· You may take showers the day after your procedure. Let the water pass gently over the surgery site. Do not rub the site. Pat the area gently or allow to air dry. °· Avoid activities that may cause your incision to open. °· Do not engage in sexual activity until the area is healed. Usually this will be in about 10-14 days. °· Only take over-the-counter or prescription medicines for pain, discomfort, or fever as directed by your health care provider. °SEEK MEDICAL CARE IF: °· You experience increasing pain. °SEEK IMMEDIATE MEDICAL CARE IF: °· You have persistent dizziness or feel sick to your stomach (nausea). °· You have difficulty staying awake or are unable to wake from sleeping. °· You have difficulty breathing or a congested  cough. °· You have a fever or shaking chills. °· You have increasing pain or tenderness at the incision site. °· You notice pus coming from the incision. °· You notice a bad smell coming from the incision or dressing. °· You cannot eat or drink or you develop nausea or vomiting. °· You have constipation that is not helped by adjusting your diet or increasing fluid intake. Pain medicines are a common cause of constipation. °· Your incision breaks open after the sutures have been removed. °· You experience pain, swelling, or redness in your genital or groin area. °  °This information is not intended to replace advice given to you by your health care provider. Make sure you discuss any questions you have with your health care provider. °  °Document Released: 11/02/2012 Document Revised: 03/07/2013 Document Reviewed: 11/02/2012 °Elsevier Interactive Patient Education ©2016 [employer] ° °

## 2015-11-26 NOTE — Interval H&P Note (Signed)
History and Physical Interval Note:  11/26/2015 10:14 AM  Edgar [name]  has presented today for surgery, with the diagnosis of testicular lesion  The various methods of treatment have been discussed with the patient and family. After consideration of risks, benefits and other options for treatment, the patient has consented to  Procedure(s): ORCHIECTOMY (Left) as a surgical intervention .  The patient's history has been reviewed, patient examined, no change in status, stable for surgery.  I have reviewed the patient's chart and labs.  Questions were answered to the patient's satisfaction.    RRR CTAB  [name]

## 2015-11-26 NOTE — H&P (View-Only) (Signed)
11/20/2015 2:07 PM   Edgar [name] 1990/11/07 [id number]  Referring provider: No referring provider defined for this encounter.  Chief Complaint  Patient presents with  . Testicle Pain    New Patient    HPI: 25 yo M presents today as referral with pain in his left testicle x 2 weeks.  He reports that the pain continue to worsen.  No associated nausea or vomiting.  He does have some lower abdominal pain.  Pain is constant.  He has taken 2 hydrocodone which did help some.    No associated urinary issues.  No dysuria or gross hematuria. He is sexually active.  No urethral discharge.  Denies history of STIs.   No fevers or chills.  This happened earlier this year around March, lasted 2 days and then resolved.   Scrotal ultrasound shows bilateral hydroceles. Left testicle heterogeneous and enlarged.  No family history of testicular cancer.     PMH: Past Medical History:  Diagnosis Date  . Asthma     Surgical History: Past Surgical History:  Procedure Laterality Date  . ANKLE FRACTURE SURGERY Right 2004    Home Medications:    Medication List       Accurate as of 11/20/15  2:07 PM. Always use your most recent med list.          doxycycline 100 MG capsule Commonly known as:  VIBRAMYCIN Take 1 capsule (100 mg total) by mouth every 12 (twelve) hours.       Allergies: No Known Allergies  Family History: Family History  Problem Relation Age of Onset  . Prostate cancer Neg Hx   . Kidney cancer Neg Hx   . Bladder Cancer Neg Hx     Social History:  reports that he has been smoking Cigarettes.  He has been smoking about 0.50 packs per day. He has quit using smokeless tobacco. His smokeless tobacco use included Snuff. He reports that he uses drugs, including Marijuana. He reports that he does not drink alcohol.  ROS: UROLOGY Frequent Urination?: Yes Hard to postpone urination?: No Burning/pain with urination?: No Get up at night to urinate?: No Leakage  of urine?: No Urine stream starts and stops?: No Trouble starting stream?: No Do you have to strain to urinate?: No Blood in urine?: No Urinary tract infection?: No Sexually transmitted disease?: No Injury to kidneys or bladder?: No Painful intercourse?: No Weak stream?: No Erection problems?: No Penile pain?: No  Gastrointestinal Nausea?: No Vomiting?: No Indigestion/heartburn?: No Diarrhea?: No Constipation?: No  Constitutional Fever: No Night sweats?: Yes Weight loss?: No Fatigue?: Yes  Skin Skin rash/lesions?: No Itching?: No  Eyes Blurred vision?: No Double vision?: No  Ears/Nose/Throat Sore throat?: No Sinus problems?: No  Hematologic/Lymphatic Swollen glands?: No Easy bruising?: No  Cardiovascular Leg swelling?: No Chest pain?: No  Respiratory Cough?: No Shortness of breath?: No  Endocrine Excessive thirst?: Yes  Musculoskeletal Back pain?: No Joint pain?: No  Neurological Headaches?: Yes Dizziness?: No  Psychologic Depression?: No Anxiety?: No  Physical Exam: BP 131/82   Pulse 96   Ht 6' 3"  (1.905 m)   Wt 272 lb (123.4 kg)   BMI 34.00 kg/m   Constitutional:  Alert and oriented, No acute distress.  Smell of cigarettes. HEENT: [place] AT, moist mucus membranes.  Trachea midline, no masses. Cardiovascular: No clubbing, cyanosis, or edema. Respiratory: Normal respiratory effort, no increased work of breathing. GI: Abdomen is soft, nontender, nondistended, no abdominal masses GU: Circumcised phallus. Softball sized left  hydrocele with some slight erythema of the overlying skin without induration or fluctuance. Unable to palpate left testicle. Right testicle with small hydrocele, palpable without tenderness or masses. Skin: No rashes, bruises or suspicious lesions. Lymph: No cervical or inguinal adenopathy. Neurologic: Grossly intact, no focal deficits, moving all 4 extremities. Psychiatric: Normal mood and affect.  Laboratory  Data: Basic Metabolic Panel (BMP) (50/11/3816 8:57 AM) Basic Metabolic Panel (BMP) (29/93/7169 8:57 AM)  Component Value Ref Range  Glucose 111 (H) 70 - 110 mg/dL  Sodium 139 136 - 145 mmol/L  Potassium 3.9 3.6 - 5.1 mmol/L  Chloride 105 97 - 109 mmol/L  Carbon Dioxide (CO2) 27.1 22.0 - 32.0 mmol/L  Calcium 9.7 8.7 - 10.3 mg/dL  Urea Nitrogen (BUN) 12 7 - 25 mg/dL  Creatinine 0.9 0.7 - 1.3 mg/dL  Glomerular Filtration Rate (eGFR), MDRD Estimate 101 >60 mL/min/1.73sq m  BUN/Crea Ratio 13.3 6.0 - 20.0  Anion Gap w/K 10.8 6.0 - 16.0    CBC w/auto Differential (5 Part) (11/20/2015 8:57 AM) CBC w/auto Differential (5 Part) (11/20/2015 8:57 AM)  Component Value Ref Range  WBC (White Blood Cell Count) 7.7 4.1 - 10.2 10^3/uL  RBC (Red Blood Cell Count) 5.29 4.69 - 6.13 10^6/uL  Hemoglobin 15.7 14.1 - 18.1 gm/dL  Hematocrit 47.7 40.0 - 52.0 %  MCV (Mean Corpuscular Volume) 90.2 80.0 - 100.0 fl  MCH (Mean Corpuscular Hemoglobin) 29.7 27.0 - 31.2 pg  MCHC (Mean Corpuscular Hemoglobin Concentration) 32.9 32.0 - 36.0 gm/dL  Platelet Count 378 150 - 450 10^3/uL  RDW-CV (Red Cell Distribution Width) 12.6 11.6 - 14.8 %  MPV (Mean Platelet Volume) 8.8 (L) 9.4 - 12.4 fl  Neutrophils 4.63 1.50 - 7.80 10^3/uL  Lymphocytes 1.76 1.00 - 3.60 10^3/uL  Monocytes 1.03 0.00 - 1.50 10^3/uL  Eosinophils 0.26 0.00 - 0.55 10^3/uL  Basophils 0.03 0.00 - 0.09 10^3/uL  Neutrophil % 59.8 32.0 - 70.0 %  Lymphocyte % 22.8 10.0 - 50.0 %  Monocyte % 13.3 (H) 4.0 - 13.0 %  Eosinophil % 3.4 1.0 - 5.0 %  Basophil% 0.4 0.0 - 2.0 %  Immature Granulocyte % 0.3 <=0.7 %  Immature Granulocyte Count 0.02 <=0.06 10^3/L    Urinalysis UA today shows 3-10 blood cells per high-power field, calcium oxalate crystals, otherwise negative.  Pertinent Imaging: CLINICAL DATA:  Testicular swelling.  EXAM: SCROTAL ULTRASOUND  DOPPLER ULTRASOUND OF THE TESTICLES  TECHNIQUE: Complete ultrasound examination of the  testicles, epididymis, and other scrotal structures was performed. Color and spectral Doppler ultrasound were also utilized to evaluate blood flow to the testicles.  COMPARISON:  CT 02/27/2007.  FINDINGS: Right testicle  Measurements: 4.0 x 2.0 x 2.7 cm. No mass or microlithiasis visualized.  Left testicle  Measurements: 10.5 x 6.9 x 5.9 cm. Left testicle is enlarged with a heterogeneous echotexture with increased color flow. Orchitis could present in this fashion. Diffuse left testicular tumor cannot be excluded.  Right epididymis:  4 mm epididymal cyst.  Left epididymis: Mild prominence of the left epididymis is noted. Heterogeneous echotexture is noted. Epididymitis cannot be excluded.  Hydrocele:  Large left hydrocele.  Small right hydrocele.  Varicocele:  None visualized.  Pulsed Doppler interrogation of both testes demonstrates normal low resistance arterial and venous waveforms bilaterally.  Bilateral scrotal wall thickening, left side greater right .  IMPRESSION: 1. Diffuse left testicular enlargement with heterogeneous echotexture and increased color flow. Orchitis could present in this fashion . Diffuse left testicular tumor cannot be excluded. No evidence of  torsion.  2. Prominence of the left epididymis with heterogeneous echotexture. Left epididymitis cannot be excluded.  3. Large left hydrocele. Small right hydrocele. Bilateral scrotal wall thickening, left side greater right.   Electronically Signed   By: Marcello Moores  Register   On: 11/20/2015 10:43  Scrotal ultrasound procedure reviewed today.  Assessment & Plan:  25 year old male with 2 weeks of severe left scrotal pain with large reactive left hydrocele and testicular heterogeneity and enlargement of the testicle and epididymis ultrasound. We discussed today that differential diagnosis is most consistent with severe left epididymoorchitis although unable to rule out underying left  testicular tumor.  1. Testicle pain Discussed caudal support, alternating ice, and we will prescribe pain meds as needed (#10 viocdin to use sparingly) - Urinalysis, Complete  2. Testicular lesion We will treat for presumed left epididymoorchitis with ceftriaxone here today in the office in 2 weeks of doxycycline Plan for close follow-up in 1 week to reassess Tumor markers sent today, if abnormal, will pursue left inguinal orchiectomy Importance close reliable follow-up was stressed today Warning symptoms reviewed  - AFP tumor marker - Lactate dehydrogenase - Beta HCG, Quant (tumor marker)  3. Microscopic hematuria Incisional microscopic hematuria, question urethritis We'll recheck at next visit  Return in about 1 week (around 11/27/2015) for (overbook as needed).  [name], MD  [facility] [address], [place], [place] [address] [phone]   Addendum 11/21/15:  Tumor markers returned elevated, LDH and AFP (beta hCG still pending) highly concerning for testicular cancer. Called patient to discuss these findings.  I have recommended left inguinal orchiectomy to be scheduled at my earliest available surgical tape, likely next week. We discussed the procedure at length today and the postoperative recovery. Risk and benefits were reviewed. All of his questions were answered.  We did also discuss fertility. He has 1 daughter and is currently engaged and will likely want additional biological children in the future. Pending pathology results, staging, and need for additional treatment, may consider sperm banking in the future.  [name], MD

## 2015-11-26 NOTE — Progress Notes (Signed)
CT scheduling called and appointment was made for the patient Tuesday, September 19th. Patient and mother was informed that they needed to stop and get the contrast to drink prior to that day. (outpatient, [facility]).

## 2015-11-26 NOTE — Anesthesia Preprocedure Evaluation (Signed)
Anesthesia Evaluation  Patient identified by MRN, date of birth, ID band Patient awake    Reviewed: Allergy & Precautions, H&P , NPO status , Patient's Chart, lab work & pertinent test results, reviewed documented beta blocker date and time   Airway Mallampati: II  TM Distance: >3 FB Neck ROM: full    Dental  (+) Teeth Intact   Pulmonary neg pulmonary ROS, asthma , Current Smoker,    Pulmonary exam normal        Cardiovascular negative cardio ROS Normal cardiovascular exam Rhythm:regular Rate:Normal     Neuro/Psych negative neurological ROS  negative psych ROS   GI/Hepatic negative GI ROS, Neg liver ROS, GERD  Medicated,  Endo/Other  negative endocrine ROS  Renal/GU negative Renal ROS  negative genitourinary   Musculoskeletal   Abdominal   Peds  Hematology negative hematology ROS (+)   Anesthesia Other Findings Past Medical History: No date: Asthma     Comment: AS A CHILD-NO INHALERS No date: GERD (gastroesophageal reflux disease)     Comment: TUMS PRN Past Surgical History: 2004: ANKLE FRACTURE SURGERY Right 2017: WISDOM TOOTH EXTRACTION BMI    Body Mass Index:  34.00 kg/m     Reproductive/Obstetrics negative OB ROS                             Anesthesia Physical Anesthesia Plan  ASA: II  Anesthesia Plan: General ETT   Post-op Pain Management:    Induction:   Airway Management Planned:   Additional Equipment:   Intra-op Plan:   Post-operative Plan:   Informed Consent: I have reviewed the patients History and Physical, chart, labs and discussed the procedure including the risks, benefits and alternatives for the proposed anesthesia with the patient or authorized representative who has indicated his/her understanding and acceptance.   Dental Advisory Given  Plan Discussed with: CRNA  Anesthesia Plan Comments:         Anesthesia Quick Evaluation

## 2015-11-26 NOTE — Op Note (Signed)
Preoperative diagnosis:  1. Left testicular mass 2. Left hydrocele  Postoperative diagnosis:  1. Left testicular mass 2. Left hydrocele  Procedure: 1. Left radical orchiectomy (inguinal approach)  Surgeon: [name], MD  Anesthesia: General  Complications: None  Intraoperative findings: Large softball sized hydrocele with underlying testicular mass.  EBL: Minimal  Specimens: Left testicle  Drains: None  Indication: This is a 25 y.o. patient left testicular mass which was confirmed on ultrasound highly suspicious for testicular cancer and elevated .  He is counseled to undergo left radical inguinal orchiectomy. After reviewing the management options for treatment, he elected to proceed with the above surgical procedure(s). We have discussed the potential benefits and risks of the procedure, side effects of the proposed treatment, the likelihood of the patient achieving the goals of the procedure, and any potential problems that might occur during the procedure or recuperation. Informed consent has been obtained.  Description of procedure:  The patient was taken to the operating room and general anesthesia was induced. The patient was placed in the supine position, shaved, prepped and draped in the usual sterile fashion, and preoperative antibiotics were administered. A preoperative time-out was performed.   At this point in time, an approximately 6 cm right subinguinal incision was created transversely using a 15 blade. Prior to making incision, I did place half percent Marcaine plain at the site of the planned incision. Incision was carried down through the subcutaneous tissues using Bovie electrocautery until the cord structures and abdominal wall were identified. The cord was gently lifted using a Babcock and a plane was created underneath the cord by elevating it. A Penrose drain was then wrapped circumferentially around the cord for hemostatic control.  Given the very  large size of the left hydrocele, a counter incision was created extending from the mid portion of the incision interiorly in a "T" shapt.  The left testicle was then delivered into the field by applying traction on the cord and everting the left hemiscrotum. The gubernaculum was then obliterated using a combination of Bovie electrocautery along with vicrly ties. Once this was freed, the cord was dissected out proximally to the external ring. The ilioinguinal nerve was identified and care taken to avoid any damage or injury to the structure. The overlying external oblique fascia was incised towards the internal ring and the right cord was freed and additional several centimeters proximal. A large Claiborne Billings x2  was then placed on the cord at the proximal most location at the level of the right internal ring (cord divided into two packets). Another Claiborne Billings was placed more distally. The cord was then transected and the testicle was passed off the field as right testicle. The cord stump was ligated using 2-0 silk 2. An additional tie was placed across the entire cord stump and along tail was left using silk suture as a tag for later identification as needed. The cord remnant was then dunked into the internal ring. The overlying external oblique fascia was closed using a running 3-0 Vicryl suture again with care taken to avoid any injury to the ilioinguinal nerve. The wound was then irrigated. The scrotum was everted to ensure that there was no residual bleeding of the gubernaculum. Once hemostasis was adequate, the wound was closed in 2 layers using a 3-0 Vicryl suture in a simple interrupted fashion to close the Scarpa's fascia followed by a 4-0 Monocryl subcuticular suture. The wound was then cleaned and dried. Dermabond was applied to the incision site. The scrotal  fluffs and a scrotal support devices were applied. He was then reversed from anesthesia and taken to the PACU in stable condition.  Plan: patient will  follow-up in one week to review pathology results.  [name], M.D.

## 2015-11-26 NOTE — Anesthesia Procedure Notes (Signed)
Procedure Name: Intubation Date/Time: 11/26/2015 10:33 AM Performed by: Allean Found Pre-anesthesia Checklist: Patient identified, Emergency Drugs available, Suction available, Patient being monitored and Timeout performed Patient Re-evaluated:Patient Re-evaluated prior to inductionOxygen Delivery Method: Circle system utilized Preoxygenation: Pre-oxygenation with 100% oxygen Intubation Type: IV induction Ventilation: Mask ventilation without difficulty Laryngoscope Size: Mac and 4 Grade View: Grade I Tube type: Oral Tube size: 7.5 mm Number of attempts: 2 Airway Equipment and Method: Stylet

## 2015-11-26 NOTE — Transfer of Care (Signed)
Immediate Anesthesia Transfer of Care Note  Patient: Edgar [name]  Procedure(s) Performed: Procedure(s) with comments: ORCHIECTOMY (Left) - radical/Inguinal approach  Patient Location: PACU  Anesthesia Type:General  Level of Consciousness: awake  Airway & Oxygen Therapy: Patient Spontanous Breathing and Patient connected to face mask oxygen  Post-op Assessment: Report given to RN and Post -op Vital signs reviewed and stable  Post vital signs: Reviewed and stable  Last Vitals:  Vitals:   11/26/15 0829 11/26/15 1231  BP: (!) 144/83 (!) 136/91  Pulse: 62 70  Resp: 18 18  Temp: 36.5 C 36.4 C    Last Pain:  Vitals:   11/26/15 0829  TempSrc: Oral  PainSc: 3       Patients Stated Pain Goal: 3 ([id number])  Complications: No apparent anesthesia complications

## 2015-11-27 ENCOUNTER — Encounter: Payer: Self-pay | Admitting: Urology

## 2015-11-28 ENCOUNTER — Ambulatory Visit
Admission: RE | Admit: 2015-11-28 | Discharge: 2015-11-28 | Disposition: A | Discharge status: Home / Self Care | Payer: BLUE CROSS/BLUE SHIELD | Source: Ambulatory Visit | Attending: Urology | Admitting: Urology

## 2015-11-28 DIAGNOSIS — N509 Disorder of male genital organs, unspecified: Secondary | ICD-10-CM | POA: Diagnosis not present

## 2015-11-28 LAB — SURGICAL PATHOLOGY

## 2015-11-28 MED ORDER — IOPAMIDOL (ISOVUE-370) INJECTION 76%
100.0000 mL | Freq: Once | INTRAVENOUS | Status: AC | PRN
  Administered 2015-11-28: 100 mL via INTRAVENOUS

## 2015-11-29 ENCOUNTER — Ambulatory Visit: Payer: BLUE CROSS/BLUE SHIELD | Admitting: Urology

## 2015-12-03 ENCOUNTER — Ambulatory Visit: Admit: 2015-12-03 | Payer: BLUE CROSS/BLUE SHIELD

## 2015-12-06 ENCOUNTER — Telehealth: Payer: Self-pay

## 2015-12-06 ENCOUNTER — Encounter: Payer: Self-pay | Admitting: Urology

## 2015-12-06 ENCOUNTER — Ambulatory Visit (INDEPENDENT_AMBULATORY_CARE_PROVIDER_SITE_OTHER): Payer: BLUE CROSS/BLUE SHIELD | Admitting: Urology

## 2015-12-06 VITALS — BP 158/84 | HR 112 | Ht 75.0 in | Wt 277.0 lb

## 2015-12-06 DIAGNOSIS — C6212 Malignant neoplasm of descended left testis: Secondary | ICD-10-CM

## 2015-12-06 MED ORDER — CEPHALEXIN 500 MG PO CAPS: 500.0000 mg | ORAL_CAPSULE | Freq: Four times a day (QID) | ORAL | 0 refills | Status: DC

## 2015-12-06 NOTE — Progress Notes (Signed)
12/06/2015 12:57 PM   Edgar [name] [birth year] [id number]  Referring provider: No referring provider defined for this encounter.  Chief Complaint  Patient presents with  . Results    CTscan results    HPI: 25 year old male who returns ~10 days s/p left radical inguinal orchiectomy for suspected testicular carcinoma.  Postoperatively, he had no issues other than some slight discomfort at the incision. No fevers or chills.  Surgical pathology reviewed today. 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 The tumor was limited to the testicles although 10.7 cm in size without evidence of vascular invasion.  Staging including CT of the chest abdomen and pelvis reveal retroperitoneal lymphadenopathy measuring 17 mm and 9 mm. Further review at tumor board also indicates a a left iliac lymph node overlying the psoas muscle measuring 3.3 cm.  Preoperatively, his tumor markers were elevated with elevated LDH of 522, elevated AFP to 411, and Hcg 2669.  Repeat tumor markers are pending postop.  He returns to the office today accompanied by his parents and fianc to discuss his staging and pathology results.   PMH: Past Medical History:  Diagnosis Date  . Asthma    AS A CHILD-NO INHALERS  . GERD (gastroesophageal reflux disease)    TUMS PRN    Surgical History: Past Surgical History:  Procedure Laterality Date  . ANKLE FRACTURE SURGERY Right 2004  . ORCHIECTOMY Left 11/26/2015   Procedure: ORCHIECTOMY;  Surgeon: [name], MD;  Location: [facility] ORS;  Service: Urology;  Laterality: Left;  radical/Inguinal approach  . WISDOM TOOTH EXTRACTION  2017    Home Medications:    Medication List       Accurate as of 12/06/15 12:57 PM. Always use your most recent med list.          acetaminophen 325 MG tablet Commonly known as:  TYLENOL Take 650 mg by mouth every 6 (six) hours as needed.   calcium carbonate 500  MG chewable tablet Commonly known as:  TUMS - dosed in mg elemental calcium Chew 1 tablet by mouth as needed for indigestion or heartburn.   docusate sodium 100 MG capsule Commonly known as:  COLACE Take 1 capsule (100 mg total) by mouth 2 (two) times daily.   doxycycline 100 MG capsule Commonly known as:  VIBRAMYCIN Take 1 capsule (100 mg total) by mouth every 12 (twelve) hours.   HYDROcodone-acetaminophen 5-325 MG tablet Commonly known as:  NORCO/VICODIN Take 1-2 tablets by mouth every 6 (six) hours as needed for moderate pain.       Allergies: No Known Allergies  Family History: Family History  Problem Relation Age of Onset  . Prostate cancer Neg Hx   . Kidney cancer Neg Hx   . Bladder Cancer Neg Hx     Social History:  reports that he has been smoking Cigarettes.  He has a 4.00 pack-year smoking history. He has quit using smokeless tobacco. His smokeless tobacco use included Snuff. He reports that he uses drugs, including Marijuana. He reports that he does not drink alcohol.  ROS: UROLOGY Frequent Urination?: No Hard to postpone urination?: No Burning/pain with urination?: No Get up at night to urinate?: No Leakage of urine?: No Urine stream starts and stops?: No Trouble starting stream?: No Do you have to strain to urinate?: No Blood in urine?: No Urinary tract infection?: No Sexually transmitted disease?: No Injury to kidneys or bladder?: No Painful intercourse?: No Weak stream?:  No Erection problems?: No Penile pain?: No  Gastrointestinal Nausea?: No Vomiting?: No Indigestion/heartburn?: No Diarrhea?: No Constipation?: No  Constitutional Fever: No Night sweats?: No Weight loss?: No Fatigue?: No  Skin Skin rash/lesions?: No Itching?: No  Eyes Blurred vision?: No Double vision?: No  Ears/Nose/Throat Sore throat?: No Sinus problems?: No  Hematologic/Lymphatic Swollen glands?: No Easy bruising?: No  Cardiovascular Leg swelling?:  No Chest pain?: No  Respiratory Cough?: No Shortness of breath?: No  Endocrine Excessive thirst?: No  Musculoskeletal Back pain?: No Joint pain?: No  Neurological Headaches?: No Dizziness?: No  Psychologic Depression?: No Anxiety?: No  Physical Exam: BP (!) 158/84   Pulse (!) 112   Ht 6\' 3"  (1.905 m)   Wt 277 lb (125.6 kg)   BMI 34.62 kg/m   Constitutional:  Alert and oriented, No acute distress. HEENT: [place] AT, moist mucus membranes.  Trachea midline, no masses. Cardiovascular: No clubbing, cyanosis, or edema. Respiratory: Normal respiratory effort, no increased work of breathing. GI: Abdomen is soft, nontender, nondistended, no abdominal masses GU: left inguinal incision healing well. Slight blanching erythema around the incision but no concern for severe infection. No drainage. Lemon sized left hemiscrotal hematoma with fullness in the left inguinal area.  Normal right testicle. Skin: No rashes, bruises or suspicious lesions. Neurologic: Grossly intact, no focal deficits, moving all 4 extremities. Psychiatric: Normal mood and affect.  Laboratory Data: Preoperative tumor markers as above  Pertinent Imaging: CLINICAL DATA:  New diagnosis of testicular mass with elevated AFP. LEFT orchectomy 11/26/2015.  EXAM: CT CHEST, ABDOMEN, AND PELVIS WITH CONTRAST  TECHNIQUE: Multidetector CT imaging of the chest, abdomen and pelvis was performed following the standard protocol during bolus administration of intravenous contrast.  CONTRAST:  100 cc Isovue 370  COMPARISON:  Scrotal ultrasound 11/20/2015, CT 02/27/2007  FINDINGS: CT CHEST FINDINGS  Cardiovascular: Unremarkable  Mediastinum/Nodes: No axillary or supraclavicular lymphadenopathy. No mediastinal hilar lymphadenopathy. No anterior mediastinal mass. Esophagus normal  Lungs/Pleura: No pulmonary nodularity.  Musculoskeletal: No aggressive osseous lesion.  CT ABDOMEN AND PELVIS  FINDINGS  Hepatobiliary: No focal hepatic lesion. No biliary ductal dilatation. Gallbladder is normal. Common bile duct is normal.  Pancreas: Pancreas is normal. No ductal dilatation. No pancreatic inflammation.  Spleen: Normal spleen  Adrenals/urinary tract: Adrenal glands and kidneys are normal. The ureters and bladder normal.  Stomach/Bowel: Stomach, small bowel, appendix, and cecum are normal. The colon and rectosigmoid colon are normal.  Vascular/Lymphatic:  Abdominal aortic normal caliber.  There is enlarged retroperitoneal lymph node LEFT of the aorta measuring 17 mm short axis on image 83, series 2. This is just inferior to the LEFT renal vein. Smaller more inferior periaortic lymph node measuring 9 mm short axis on image 89, series 2 at the level the inferior mesenteric artery. No retrocrural adenopathy. No pelvic lymphadenopathy.  Reproductive: Prostate is normal. Postsurgical change and swelling with scattered locules of gas in the LEFT hemiscrotum and LEFT suprapubic subcutaneous tissues. Mild thickening along the LEFT inguinal canal.  Other: No peritoneal disease.  Musculoskeletal: No aggressive osseous lesion.  IMPRESSION: Chest Impression:  1. No mediastinal adenopathy. 2. No anterior mediastinal mass. 3. No suspicious pulmonary nodules.  Abdomen / Pelvis Impression:  1. Two prominent LEFT periaortic retroperitoneal lymph nodes are concerning for TESTICULAR NODAL METASTASIS. No abnormal lymph nodes noted above the renal veins. 2. Postsurgical change in the LEFT hemiscrotum and LEFT inguinal canal. 3. No skeletal metastasis.   Electronically Signed   By: Suzy Bouchard M.D.   On:  11/28/2015 15:50   CT scan personally reviewed today  Assessment & Plan:    1. Malignant neoplasm of descended left testis [facility]) Pathology results reviewed in detail today. 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  Repeat tumor markers in 2 weeks.  Case was discussed today with the patient and yesterday at [facility] tumor board. Recommend BEP x3 vs EPx4 per oncology.    Further staging with brain MRI recommended as well as pre-chemotherapy PFTs which were ordered today.  Given slight blanching erythema at the incision which is likely reactive, we'll go ahead and prescribe Keflex for 5 days as a precaution.  Other warning symptoms are reviewed.  Lengthy discussion today regarding patient's fertility status and goals. He is engaged to be married and does desire to have children with his fiance in the future. Information of sperm banking/cryopreservation was provided today in the importance of this prior to any further intervention was discussed in detail.  All he and his family's questions were answered today. Plan for referral to medical oncology ASAP.   - Ambulatory referral to Oncology - Pulmonary Function Test [facility] Only; Future - AFP tumor marker; Future - Lactate dehydrogenase; Future - Beta HCG, Quant (tumor marker); Future - MR Brain W Wo Contrast; Future   [name], MD  [facility] [address], [place], [place] [address] [phone]  I spent 25 min with this patient of which greater than 50% was spent in counseling and coordination of care with the patient.

## 2015-12-06 NOTE — Telephone Encounter (Signed)
  Oncology Nurse Navigator Documentation Referral has been sent from Dr Erlene Quan for medical oncology. Appt arranged with Dr Mike Gip for 9/26 at 1300. Notified Edgar [name] and directions to cancer center given. Instrcuted that a new patient packet will be left at the front desk for him. Navigator Location: [facility] (12/06/15 1700) Navigator Encounter Type: Introductory phone call;Telephone (12/06/15 1700) Telephone: Edgar [name] Call;Appt Confirmation/Clarification (12/06/15 1700)             Barriers/Navigation Needs: Coordination of Care (12/06/15 1700)   Interventions: Coordination of Care (12/06/15 1700)   Coordination of Care: Appts (12/06/15 1700)                  Time Spent with Patient: 30 (12/06/15 1700)

## 2015-12-09 ENCOUNTER — Telehealth: Payer: Self-pay | Admitting: Urology

## 2015-12-09 NOTE — Anesthesia Postprocedure Evaluation (Signed)
Anesthesia Post Note  Patient: Edgar [name]  Procedure(s) Performed: Procedure(s) (LRB): ORCHIECTOMY (Left)  Patient location during evaluation: PACU Anesthesia Type: General Level of consciousness: awake and alert Pain management: pain level controlled Vital Signs Assessment: post-procedure vital signs reviewed and stable Respiratory status: spontaneous breathing, nonlabored ventilation, respiratory function stable and patient connected to nasal cannula oxygen Cardiovascular status: blood pressure returned to baseline and stable Postop Assessment: no signs of nausea or vomiting Anesthetic complications: no    Last Vitals:  Vitals:   11/26/15 1429 11/26/15 1456  BP: 126/83 (!) 151/80  Pulse:    Resp: 16 16  Temp: 36.8 C     Last Pain:  Vitals:   11/26/15 1429  TempSrc: Oral  PainSc: [place] Sharrell Krawiec

## 2015-12-09 NOTE — Progress Notes (Signed)
[facility] day:  12/10/2015  Chief Complaint: Edgar [name] is a 25 y.o. male with testicular cancer who is referred in consultation by Dr. [name] for assessment and management.  HPI: The patient presented with a two-week history of severe left scrotal pain and a large testicular mass.  He was seen in the [facility] and referred to Dr. [name].  Exam revealed a softball sized left hydrocele with a difficult to palpate left testicle.  Ultrasound and tumor markers were sent.  Scrotal ultrasound on 11/20/2015 revealed diffuse left testicular engorgement with heterogeneous echotexture and increase color flow (orchitis versus testicular tumor). There was prominence of the left epididymis with heterogeneous echotexture.  There was a large left hydrocele and a small right hydrocele.  There was bilateral scrotal wall thickening (left > right).  Labs on 11/20/2015 revealed an AFP of 411.9, beta-HCG 2,669, and LDH 522 (121-224).  He underwent left radical orchiectomy on 11/26/2015 by Dr. [name].  He was noted to have a large softball size hydrocele with underlying testicular mass.  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  Pathologic stage was T1NxMx.  Chest, abdomen, and pelvic CT scan on 11/28/2015 revealed no mediastinal adenopathy or suspicious pulmonary nodules.   There were 2 prominent left periaortic retroperitoneal lymph nodes (1.7 cm and 0.9 cm) concerning for testicular nodal metastasis.  There were no abnormal lymph nodes above the renal veins.  There were postsurgical change in the left hemiscrotum and left inguinal canal.   He is scheduled for Head MRI on 12/17/2015.  He had pulmonary function tests performed today.  He has been given information about sperm banking.  Symptomatically, he feels sore post  operatively.  He denies any systemic symptoms.   Past Medical History:  Diagnosis Date  . Asthma    AS A CHILD-NO INHALERS  . GERD (gastroesophageal reflux disease)    TUMS PRN    Past Surgical History:  Procedure Laterality Date  . ANKLE FRACTURE SURGERY Right 2004  . ORCHIECTOMY Left 11/26/2015   Procedure: ORCHIECTOMY;  Surgeon: [name], MD;  Location: [facility] ORS;  Service: Urology;  Laterality: Left;  radical/Inguinal approach  . WISDOM TOOTH EXTRACTION  2017    Family History  Problem Relation Age of Onset  . Skin cancer Mother   . Breast cancer Maternal Grandmother   . Colon cancer Maternal Grandfather   . Diabetes Maternal Grandfather   . Prostate cancer Neg Hx   . Kidney cancer Neg Hx   . Bladder Cancer Neg Hx     Social History:  reports that he has been smoking Cigarettes.  He has a 4.00 pack-year smoking history. He has quit using smokeless tobacco. His smokeless tobacco use included Snuff. He reports that he uses drugs, including Marijuana. He reports that he does not drink alcohol.  He smokes 1/2 pack a day.  He smokes marijuana.  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  He has a 41 year-old-daughter named [name]. The patient is accompanied by his mother and girlfriend, Eudelia Bunch, today.  Allergies: No Known Allergies  Current Medications: Current Outpatient Prescriptions  Medication Sig Dispense Refill  . acetaminophen (TYLENOL) 325 MG tablet Take 650 mg by mouth every 6 (six) hours as needed.    . calcium carbonate (TUMS - DOSED IN MG  ELEMENTAL CALCIUM) 500 MG chewable tablet Chew 1 tablet by mouth as needed for indigestion or heartburn.    . cephALEXin (KEFLEX) 500 MG capsule Take 1 capsule (500 mg total) by mouth 4 (four) times daily. 20 capsule 0  . docusate sodium (COLACE) 100 MG capsule Take 1 capsule (100 mg total) by mouth 2 (two) times daily. 60 capsule 0   No current facility-administered medications for this visit.      Review of Systems:  GENERAL:  Feels good.  No fevers, sweats or weight loss. PERFORMANCE STATUS (ECOG):  1 HEENT:  No visual changes, runny nose, sore throat, mouth sores or tenderness. Lungs: No shortness of breath or cough.  No hemoptysis. Cardiac:  No chest pain, palpitations, orthopnea, or PND. GI:  No nausea, vomiting, diarrhea, constipation, melena or hematochezia. GU:  No urgency, frequency, dysuria, or hematuria. Musculoskeletal:  No back pain.  No joint pain.  No muscle tenderness. Extremities:  No pain or swelling. Skin:  No rashes or skin changes. Neuro:  No headache, numbness or weakness, balance or coordination issues. Endocrine:  No diabetes, thyroid issues, hot flashes or night sweats. Psych:  No mood changes, depression or anxiety. Pain:  Post-operative discomfort. Review of systems:  All other systems reviewed and found to be negative.  Physical Exam: Blood pressure (!) 126/99, pulse 88, temperature 98.2 F (36.8 C), temperature source Tympanic, resp. rate 18, height 6\' 3"  (1.905 m), weight 269 lb 2.9 oz (122.1 kg). GENERAL:  Well developed, well nourished, gentleman sitting comfortably in the exam room in no acute distress. MENTAL STATUS:  Alert and oriented to person, place and time. HEAD:  Brown hair and full beard.  Normocephalic, atraumatic, face symmetric, no Cushingoid features. EYES:  Pupils equal round and reactive to light and accomodation.  No conjunctivitis or scleral icterus. ENT:  Oropharynx clear without lesion.  Tongue normal. Mucous membranes moist.  RESPIRATORY:  Clear to auscultation without rales, wheezes or rhonchi. CARDIOVASCULAR:  Regular rate and rhythm without murmur, rub or gallop. ABDOMEN:  Soft, non-tender, with active bowel sounds, and no hepatosplenomegaly.  No masses. GU:  Right testicle without masses or tenderness.  s/p left orchiectomy with well healing inguinal incision and large hematoma in left hemiscrotum. SKIN:  No rashes,  ulcers or lesions. EXTREMITIES: No edema, no skin discoloration or tenderness.  No palpable cords. LYMPH NODES: No palpable cervical, supraclavicular, axillary or inguinal adenopathy  NEUROLOGICAL: Unremarkable. PSYCH:  Appropriate.   No visits with results within 3 Day(s) from this visit.  Latest known visit with results is:  Admission on 11/26/2015, Discharged on 11/26/2015  Component Date Value Ref Range Status  . Tricyclic, Ur Screen [id number] NONE DETECTED  NONE DETECTED Final  . Amphetamines, Ur Screen 11/26/2015 NONE DETECTED  NONE DETECTED Final  . MDMA (Ecstasy)Ur Screen 11/26/2015 NONE DETECTED  NONE DETECTED Final  . Cocaine Metabolite,Ur [facility] 11/26/2015 NONE DETECTED  NONE DETECTED Final  . Opiate, Ur Screen 11/26/2015 NONE DETECTED  NONE DETECTED Final  . Phencyclidine (PCP) Ur S 11/26/2015 NONE DETECTED  NONE DETECTED Final  . Cannabinoid 50 Ng, Ur [place] 11/26/2015 POSITIVE* NONE DETECTED Final  . Barbiturates, Ur Screen 11/26/2015 NONE DETECTED  NONE DETECTED Final  . Benzodiazepine, Ur Scrn 11/26/2015 NONE DETECTED  NONE DETECTED Final  . Methadone Scn, Ur 11/26/2015 NONE DETECTED  NONE DETECTED Final   Comment: (NOTE) [id number]  Tricyclics, urine               Cutoff 1000 ng/mL 200  Amphetamines, urine             Cutoff 1000 ng/mL 300  MDMA (Ecstasy), urine           Cutoff 500 ng/mL 400  Cocaine Metabolite, urine       Cutoff 300 ng/mL 500  Opiate, urine                   Cutoff 300 ng/mL 600  Phencyclidine (PCP), urine      Cutoff 25 ng/mL 700  Cannabinoid, urine              Cutoff 50 ng/mL 800  Barbiturates, urine             Cutoff 200 ng/mL 900  Benzodiazepine, urine           Cutoff 200 ng/mL 1000 Methadone, urine                Cutoff 300 ng/mL 1100 1200 The urine drug screen provides only a preliminary, unconfirmed 1300 analytical test result and should not be used for non-medical 1400 purposes. Clinical consideration and professional judgment should 1500 be  applied to any positive drug screen result due to possible 1600 interfering substances. A more specific alternate chemical method 1700 must be used in order to obtain a confirmed analytical result.  1800 Gas chromato                          graphy / mass spectrometry (GC/MS) is the preferred 1900 confirmatory method.   . SURGICAL PATHOLOGY 11/28/2015    Final                   Value:Surgical Pathology CASE: [id number] PATIENT: Edgar [name] Surgical Pathology Report     SPECIMEN SUBMITTED: A. Testicle, left  CLINICAL HISTORY: LDH 522 (upper limit of normal 224), hCG 2669, AFP 411.9  PRE-OPERATIVE DIAGNOSIS: Testicular lesion  POST-OPERATIVE DIAGNOSIS: Large hydrocele and testicular mass     DIAGNOSIS: A. LEFT TESTIS; RADICAL ORCHIECTOMY: - MIXED GERM CELL TUMOR: SEMINOMA (50%), EMBRYONAL CARCINOMA (25%), YOLK SAC TUMOR (25%), LIMITED TO THE TESTIS, WITHOUT LYMPHOVASCULAR INVASION.  Comment: The diagnosis was called to Dr. Erlene Quan on 11/27/15 at 1:25 PM.  TESTIS: Radical Orchiectomy Testis, Radical Orchiectomy Cancer Case Summary Procedure:     Radical orchiectomy Specimen Site: Testis structure (body structure) Tumor Site:    Testis structure SPECIMEN Specimen Laterality:     Left Tumor Focality:     Unifocal Tumor Size:    Greatest dimension of main tumor mass (cm) 10.7cm TUMOR Histologic Type:    Mixed germ cell tumo                         r (specify components and approximate percentages) 100% EXTENT Macroscopic Extent of Tumor:  Confined to the testis Microscopic Tumor Extension:  +Rete testis MARGINS Spermatic Cord Margin:   Uninvolved by tumor Other Margin(s):    Not applicable ACCESSORY FINDINGS Lymph-Vascular Invasion: Not identified STAGE (pTNM) Primary Tumor (pT): pT1: Tumor limited to the testis and epididymis without vascular / lymphatic invasion; tumor may invade tunica albuginea but not tunica vaginalis Regional Lymph Nodes  (pN) pNX: Cannot be assessed No nodes submitted or found Distant Metastasis (pM): Not applicable    GROSS DESCRIPTION: A. Labeled: Left testicle Type of procedure: Radical orchiectomy Laterality: Left Weight of specimen: 765 grams Size of specimen:  Testis: 14 x 12 x 6.7 cm           Spermatic cord: 8.8 x 0.4 cm           Epididymis: Not identified grossly, probably due to compression from tumor  Structures attached to testis: Spermatic cord and b                         lood vessels, tunica vaginalis.  Orientation: Tunica vaginalis inked blue  Number of masses: 1  Size(s) of mass(es): 10.7 x 6.8 x 4.5 cm  Description of mass(es): Testis is almost completely replaces by solid tumor with pink-tan to red fleshy and nodular appearance. Portions of the mass are markedly discolored yellow appearing softened and necrotic and other portions appear hemorrhagic.  Confinement to testis: Confined  Relationship of tumor to adjacent anatomic structures:      Epididymis: Cannot be determined      Tunica vaginalis: Does not involve      Paratesticular soft tissues: Does not involve      Spermatic cord: Does not involve      Hilar soft tissue: Does not involve  Margins: 9.2 cm from spermatic cord margin.  Description of remainder of tissue; The spermatic cord blood vessels and attached soft tissues are unremarkable  Block summary: 1- spermatic cord and blood vessel margin 2- spermatic cord and blood vessels midportion 3- spermatic cord a                         nd blood vessels adjacent to testis 4-13- sections of testicular mass; some sections include adherent inked tunica vaginalis 14- section of possible epididymis 15-20- Additional sections of possible epididymis submitted on 11/27/2015  Final Diagnosis performed by Bryan Lemma, MD.  Electronically signed 11/28/2015 4:30:58PM    The electronic signature indicates that the named Attending Pathologist has  evaluated the specimen  Technical component performed at [facility], [address] [place], [place] [address] Lab: [phone] Dir: Darrick Penna. Evette Doffing, MD  Professional component performed at [facility], [facility], [place], [place], [place] [address] Lab: [phone] Dir: Dellia Nims. Rubinas, MD      Assessment:  Edgar [name] is a 25 y.o. male with stage IIA left testicular cancer s/p left radical orchiectomy on 11/26/2015.  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  Pathologic stage is T1N1SxM0.  Pre surgery labs on 11/20/2015 revealed an AFP of 411.9, beta-HCG 2,669, and LDH 522 (121-224).  He is scheduled for repeat labs on 12/19/2015.  Chest, abdomen, and pelvic CT scan on 11/28/2015 revealed no mediastinal adenopathy or suspicious pulmonary nodules.   There were 2 prominent left periaortic retroperitoneal lymph nodes (1.7 cm and 0.9 cm) concerning for testicular nodal metastasis.  There were no abnormal lymph nodes above the renal veins.  There were postsurgical change in the left hemiscrotum and left inguinal canal.   Head MRI is scheduled for 12/17/2015.  Pulmonary function tests were performed today.  He is considering sperm banking.  Symptomatically, he feels sore post operatively.  He denies any systemic symptoms.  Plan: 1.  Discuss diagnosis, staging, and management of testicular cancer.  Pathology reveals a mixed germ cell tumor. Pathologic stage is T1N1 based on his pathology and CT scan results.  Tumor markers following his orchiectomy will be drawn on 12/19/2015  and will either be S0 (normal) or S1 (LDH < 1.5x ULN, AFP < 1,000, beta-HCG < 5,000).  He has good risk disease.  I suspect his markers will remain elevated (S1) given disease in the enlarged retroperitoneal lymph nodes.  Half life for beta-HCG is 1-3 days and AFP is 5-7 days.  I  encouraged him to stop smoking marijuana as this can cause an elevation in beta-HCG.   He has stage IIA disease (T1N1S0 or T1N1S1). We discussed standard chemotherapy consisting of 3 cycles of BEP chemotherapy or 4 cycles of EP chemotherapy. The differences between these 2 regimens were discussed in detail. 3 cycles of BEP will complete in 9 weeks versus 12 weeks of EP.  I typically treat young healthy patients with normal pulmonary function with 3 cycles of BEP.  Side effects of bleomycin, etoposide, and cisplatin were discussed in detail.  Patients receive etoposide and cisplatin daily for 5 days in a row (Monday-Friday) every 3 weeks.  Patients receiving BEP will also receive weekly bleomycin.    We discussed Port-A-Cath placement. We discussed a follow-up head MRI scheduled for 12/17/2015.  Report of his pulmonary function testing done today will be obtained. I encouraged him to stop smoking. If he is a considering sperm banking, I discussed pursuing that option now before chemotherapy starts.    2.  Schedule port-a-cath placement on 12/20/2015. 3.  Preauth BEP. 4.  Schedule chemotherapy class. 5.  Follow-up:  Head MRI, PFTs, post orchiectomy tumor markers. 6.  Schedule baseline hearing test secondary to cisplatin. 7.  RTC on 12/23/2015 for MD assessment, labs (CBC with diff, CMP, Mg), and cycle #1 BEP.   [name], MD  12/10/2015, 1:43 PM

## 2015-12-10 ENCOUNTER — Ambulatory Visit: Payer: BLUE CROSS/BLUE SHIELD | Attending: Urology

## 2015-12-10 ENCOUNTER — Inpatient Hospital Stay: Payer: BLUE CROSS/BLUE SHIELD | Attending: Hematology and Oncology | Admitting: Hematology and Oncology

## 2015-12-10 ENCOUNTER — Encounter: Payer: Self-pay | Admitting: Hematology and Oncology

## 2015-12-10 ENCOUNTER — Other Ambulatory Visit: Payer: Self-pay | Admitting: Hematology and Oncology

## 2015-12-10 DIAGNOSIS — Z808 Family history of malignant neoplasm of other organs or systems: Secondary | ICD-10-CM | POA: Diagnosis not present

## 2015-12-10 DIAGNOSIS — C6212 Malignant neoplasm of descended left testis: Secondary | ICD-10-CM

## 2015-12-10 DIAGNOSIS — F17210 Nicotine dependence, cigarettes, uncomplicated: Secondary | ICD-10-CM | POA: Diagnosis not present

## 2015-12-10 DIAGNOSIS — K219 Gastro-esophageal reflux disease without esophagitis: Secondary | ICD-10-CM | POA: Insufficient documentation

## 2015-12-10 DIAGNOSIS — Z800 Family history of malignant neoplasm of digestive organs: Secondary | ICD-10-CM | POA: Insufficient documentation

## 2015-12-10 DIAGNOSIS — N433 Hydrocele, unspecified: Secondary | ICD-10-CM

## 2015-12-10 DIAGNOSIS — R591 Generalized enlarged lymph nodes: Secondary | ICD-10-CM | POA: Insufficient documentation

## 2015-12-10 DIAGNOSIS — Z803 Family history of malignant neoplasm of breast: Secondary | ICD-10-CM | POA: Insufficient documentation

## 2015-12-10 DIAGNOSIS — Z79899 Other long term (current) drug therapy: Secondary | ICD-10-CM

## 2015-12-10 NOTE — Progress Notes (Signed)
START ON PATHWAY REGIMEN - Testicular  Other Clinical Trial: BEP  Patient Characteristics: Nonseminoma, Newly Diagnosed, Stage IIA (Lymph Node Mass &lt; 3 cm) With Normal Serum hCG &amp; AFP AJCC N Stage: 1 AJCC M Stage: 0 Serum Tumor Markers: 1 AJCC Stage Grouping: IIA Current evidence of distant metastases? No AJCC T Stage: 1  Intent of Therapy: Curative Intent, Discussed with Patient

## 2015-12-10 NOTE — Progress Notes (Signed)
Patient is here for new patient appointment he is two weeks post op.

## 2015-12-10 NOTE — Progress Notes (Signed)
  Oncology Nurse Navigator Documentation Met with Edgar [name] and his family prior to appt with Dr. Mike Gip. Introduced Therapist, nutritional and provided contact information for any future questions or concerns. Called Dr Cherrie Gauze office regarding his return to work. She stated he can return to work with no restrictions.  Navigator Location: [facility]-Med Onc (12/10/15 1600) Navigator Encounter Type: Initial MedOnc (12/10/15 1600)       Surgery Date: 11/26/15 (12/10/15 1600)       Barriers/Navigation Needs: No barriers at this time (12/10/15 1600)                          Time Spent with Patient: 30 (12/10/15 1600)

## 2015-12-11 ENCOUNTER — Other Ambulatory Visit: Payer: Self-pay | Admitting: *Deleted

## 2015-12-11 ENCOUNTER — Other Ambulatory Visit: Payer: Self-pay | Admitting: Vascular Surgery

## 2015-12-11 DIAGNOSIS — C6212 Malignant neoplasm of descended left testis: Secondary | ICD-10-CM

## 2015-12-12 ENCOUNTER — Encounter: Payer: Self-pay | Admitting: Urology

## 2015-12-12 ENCOUNTER — Other Ambulatory Visit: Payer: Self-pay | Admitting: *Deleted

## 2015-12-12 DIAGNOSIS — Z95828 Presence of other vascular implants and grafts: Secondary | ICD-10-CM

## 2015-12-12 DIAGNOSIS — C6212 Malignant neoplasm of descended left testis: Secondary | ICD-10-CM

## 2015-12-12 MED ORDER — LIDOCAINE-PRILOCAINE 2.5-2.5 % EX CREA: TOPICAL_CREAM | CUTANEOUS | 1 refills | Status: DC

## 2015-12-13 NOTE — Patient Instructions (Signed)
Bleomycin injection  What is this medicine?  BLEOMYCIN (blee oh MYE sin) is a chemotherapy drug. It is used to treat many kinds of cancer like lymphoma, cervical cancer, head and neck cancer, and testicular cancer. It is also used to prevent and to treat fluid build-up around the lungs caused by some cancers.  This medicine may be used for other purposes; ask your health care provider or pharmacist if you have questions.  What should I tell my health care provider before I take this medicine?  They need to know if you have any of these conditions:  -cigarette smoker  -kidney disease  -lung disease  -recent or ongoing radiation therapy  -an unusual or allergic reaction to bleomycin, other chemotherapy agents, other medicines, foods, dyes, or preservatives  -pregnant or trying to get pregnant  -breast-feeding  How should I use this medicine?  This drug is given as an infusion into a vein or a body cavity. It can also be given as an injection into a muscle or under the skin. It is administered in a hospital or clinic by a specially trained health care professional.  Talk to your pediatrician regarding the use of this medicine in children. Special care may be needed.  Overdosage: If you think you have taken too much of this medicine contact a poison control center or emergency room at once.  NOTE: This medicine is only for you. Do not share this medicine with others.  What if I miss a dose?  It is important not to miss your dose. Call your doctor or health care professional if you are unable to keep an appointment.  What may interact with this medicine?  -certain antibiotics given by injection  -cisplatin  -cyclosporine  -diuretics  -foscarnet  -medicines to increase blood counts like filgrastim, pegfilgrastim, sargramostim  -vaccines  This list may not describe all possible interactions. Give your health care provider a list of all the medicines, herbs, non-prescription drugs, or dietary supplements you use. Also tell  them if you smoke, drink alcohol, or use illegal drugs. Some items may interact with your medicine.  What should I watch for while using this medicine?  Visit your doctor for checks on your progress. This drug may make you feel generally unwell. This is not uncommon, as chemotherapy can affect healthy cells as well as cancer cells. Report any side effects. Continue your course of treatment even though you feel ill unless your doctor tells you to stop.  Call your doctor or health care professional for advice if you get a fever, chills or sore throat, or other symptoms of a cold or flu. Do not treat yourself. This drug decreases your body's ability to fight infections. Try to avoid being around people who are sick.  Avoid taking products that contain aspirin, acetaminophen, ibuprofen, naproxen, or ketoprofen unless instructed by your doctor. These medicines may hide a fever.  Do not become pregnant while taking this medicine. Women should inform their doctor if they wish to become pregnant or think they might be pregnant. There is a potential for serious side effects to an unborn child. Talk to your health care professional or pharmacist for more information. Do not breast-feed an infant while taking this medicine.  There is a maximum amount of this medicine you should receive throughout your life. The amount depends on the medical condition being treated and your overall health. Your doctor will watch how much of this medicine you receive in your lifetime. Tell your doctor   if you have taken this medicine before.  What side effects may I notice from receiving this medicine?  Side effects that you should report to your doctor or health care professional as soon as possible:  -allergic reactions like skin rash, itching or hives, swelling of the face, lips, or tongue  -breathing problems  -chest pain  -confusion  -cough  -fast, irregular heartbeat  -feeling faint or lightheaded, falls  -fever or chills  -mouth  sores  -pain, tingling, numbness in the hands or feet  -trouble passing urine or change in the amount of urine  -yellowing of the eyes or skin  Side effects that usually do not require medical attention (report to your doctor or health care professional if they continue or are bothersome):  -darker skin color  -hair loss  -irritation at site where injected  -loss of appetite  -nail changes  -nausea and vomiting  -weight loss  This list may not describe all possible side effects. Call your doctor for medical advice about side effects. You may report side effects to FDA at 1-800-FDA-1088.  Where should I keep my medicine?  This drug is given in a hospital or clinic and will not be stored at home.  NOTE: This sheet is a summary. It may not cover all possible information. If you have questions about this medicine, talk to your doctor, pharmacist, or health care provider.      2016, Elsevier/Gold Standard. (2012-06-28 09:36:48)  Etoposide, VP-16 injection  What is this medicine?  ETOPOSIDE, VP-16 (e toe POE side) is a chemotherapy drug. It is used to treat testicular cancer, lung cancer, and other cancers.  This medicine may be used for other purposes; ask your health care provider or pharmacist if you have questions.  What should I tell my health care provider before I take this medicine?  They need to know if you have any of these conditions:  -infection  -kidney disease  -low blood counts, like low white cell, platelet, or red cell counts  -an unusual or allergic reaction to etoposide, other chemotherapeutic agents, other medicines, foods, dyes, or preservatives  -pregnant or trying to get pregnant  -breast-feeding  How should I use this medicine?  This medicine is for infusion into a vein. It is administered in a hospital or clinic by a specially trained health care professional.  Talk to your pediatrician regarding the use of this medicine in children. Special care may be needed.  Overdosage: If you think you have  taken too much of this medicine contact a poison control center or emergency room at once.  NOTE: This medicine is only for you. Do not share this medicine with others.  What if I miss a dose?  It is important not to miss your dose. Call your doctor or health care professional if you are unable to keep an appointment.  What may interact with this medicine?  -aspirin  -certain medications for seizures like carbamazepine, phenobarbital, phenytoin, valproic acid  -cyclosporine  -levamisole  -warfarin  This list may not describe all possible interactions. Give your health care provider a list of all the medicines, herbs, non-prescription drugs, or dietary supplements you use. Also tell them if you smoke, drink alcohol, or use illegal drugs. Some items may interact with your medicine.  What should I watch for while using this medicine?  Visit your doctor for checks on your progress. This drug may make you feel generally unwell. This is not uncommon, as chemotherapy can affect healthy   cells as well as cancer cells. Report any side effects. Continue your course of treatment even though you feel ill unless your doctor tells you to stop.  In some cases, you may be given additional medicines to help with side effects. Follow all directions for their use.  Call your doctor or health care professional for advice if you get a fever, chills or sore throat, or other symptoms of a cold or flu. Do not treat yourself. This drug decreases your body's ability to fight infections. Try to avoid being around people who are sick.  This medicine may increase your risk to bruise or bleed. Call your doctor or health care professional if you notice any unusual bleeding.  Be careful brushing and flossing your teeth or using a toothpick because you may get an infection or bleed more easily. If you have any dental work done, tell your dentist you are receiving this medicine.  Avoid taking products that contain aspirin, acetaminophen, ibuprofen,  naproxen, or ketoprofen unless instructed by your doctor. These medicines may hide a fever.  Do not become pregnant while taking this medicine or for at least 6 months after stopping it. Women should inform their doctor if they wish to become pregnant or think they might be pregnant. Women of child-bearing potential will need to have a negative pregnancy test before starting this medicine. There is a potential for serious side effects to an unborn child. Talk to your health care professional or pharmacist for more information. Do not breast-feed an infant while taking this medicine.  Men must use a latex condom during sexual contact with a woman while taking this medicine and for at least 4 months after stopping it. A latex condom is needed even if you have had a vasectomy. Contact your doctor right away if your partner becomes pregnant. Do not donate sperm while taking this medicine and for at least 4 months after you stop taking this medicine. Men should inform their doctors if they wish to father a child. This medicine may lower sperm counts.  What side effects may I notice from receiving this medicine?  Side effects that you should report to your doctor or health care professional as soon as possible:  -allergic reactions like skin rash, itching or hives, swelling of the face, lips, or tongue  -low blood counts - this medicine may decrease the number of white blood cells, red blood cells and platelets. You may be at increased risk for infections and bleeding.  -signs of infection - fever or chills, cough, sore throat, pain or difficulty passing urine  -signs of decreased platelets or bleeding - bruising, pinpoint red spots on the skin, black, tarry stools, blood in the urine  -signs of decreased red blood cells - unusually weak or tired, fainting spells, lightheadedness  -breathing problems  -changes in vision  -mouth or throat sores or ulcers  -pain, redness, swelling or irritation at the injection site  -pain,  tingling, numbness in the hands or feet  -redness, blistering, peeling or loosening of the skin, including inside the mouth  -seizures  -vomiting  Side effects that usually do not require medical attention (report to your doctor or health care professional if they continue or are bothersome):  -diarrhea  -hair loss  -loss of appetite  -nausea  -stomach pain  This list may not describe all possible side effects. Call your doctor for medical advice about side effects. You may report side effects to FDA at 1-800-FDA-1088.  Where should I   keep my medicine?  This drug is given in a hospital or clinic and will not be stored at home.  NOTE: This sheet is a summary. It may not cover all possible information. If you have questions about this medicine, talk to your doctor, pharmacist, or health care provider.      2016, Elsevier/Gold Standard. (2013-10-26 12:32:50)  Cisplatin injection  What is this medicine?  CISPLATIN (SIS pla tin) is a chemotherapy drug. It targets fast dividing cells, like cancer cells, and causes these cells to die. This medicine is used to treat many types of cancer like bladder, ovarian, and testicular cancers.  This medicine may be used for other purposes; ask your health care provider or pharmacist if you have questions.  What should I tell my health care provider before I take this medicine?  They need to know if you have any of these conditions:  -blood disorders  -hearing problems  -kidney disease  -recent or ongoing radiation therapy  -an unusual or allergic reaction to cisplatin, carboplatin, other chemotherapy, other medicines, foods, dyes, or preservatives  -pregnant or trying to get pregnant  -breast-feeding  How should I use this medicine?  This drug is given as an infusion into a vein. It is administered in a hospital or clinic by a specially trained health care professional.  Talk to your pediatrician regarding the use of this medicine in children. Special care may be needed.  Overdosage:  If you think you have taken too much of this medicine contact a poison control center or emergency room at once.  NOTE: This medicine is only for you. Do not share this medicine with others.  What if I miss a dose?  It is important not to miss a dose. Call your doctor or health care professional if you are unable to keep an appointment.  What may interact with this medicine?  -dofetilide  -foscarnet  -medicines for seizures  -medicines to increase blood counts like filgrastim, pegfilgrastim, sargramostim  -probenecid  -pyridoxine used with altretamine  -rituximab  -some antibiotics like amikacin, gentamicin, neomycin, polymyxin B, streptomycin, tobramycin  -sulfinpyrazone  -vaccines  -zalcitabine  Talk to your doctor or health care professional before taking any of these medicines:  -acetaminophen  -aspirin  -ibuprofen  -ketoprofen  -naproxen  This list may not describe all possible interactions. Give your health care provider a list of all the medicines, herbs, non-prescription drugs, or dietary supplements you use. Also tell them if you smoke, drink alcohol, or use illegal drugs. Some items may interact with your medicine.  What should I watch for while using this medicine?  Your condition will be monitored carefully while you are receiving this medicine. You will need important blood work done while you are taking this medicine.  This drug may make you feel generally unwell. This is not uncommon, as chemotherapy can affect healthy cells as well as cancer cells. Report any side effects. Continue your course of treatment even though you feel ill unless your doctor tells you to stop.  In some cases, you may be given additional medicines to help with side effects. Follow all directions for their use.  Call your doctor or health care professional for advice if you get a fever, chills or sore throat, or other symptoms of a cold or flu. Do not treat yourself. This drug decreases your body's ability to fight infections.  Try to avoid being around people who are sick.  This medicine may increase your risk to bruise or   bleed. Call your doctor or health care professional if you notice any unusual bleeding.  Be careful brushing and flossing your teeth or using a toothpick because you may get an infection or bleed more easily. If you have any dental work done, tell your dentist you are receiving this medicine.  Avoid taking products that contain aspirin, acetaminophen, ibuprofen, naproxen, or ketoprofen unless instructed by your doctor. These medicines may hide a fever.  Do not become pregnant while taking this medicine. Women should inform their doctor if they wish to become pregnant or think they might be pregnant. There is a potential for serious side effects to an unborn child. Talk to your health care professional or pharmacist for more information. Do not breast-feed an infant while taking this medicine.  Drink fluids as directed while you are taking this medicine. This will help protect your kidneys.  Call your doctor or health care professional if you get diarrhea. Do not treat yourself.  What side effects may I notice from receiving this medicine?  Side effects that you should report to your doctor or health care professional as soon as possible:  -allergic reactions like skin rash, itching or hives, swelling of the face, lips, or tongue  -signs of infection - fever or chills, cough, sore throat, pain or difficulty passing urine  -signs of decreased platelets or bleeding - bruising, pinpoint red spots on the skin, black, tarry stools, nosebleeds  -signs of decreased red blood cells - unusually weak or tired, fainting spells, lightheadedness  -breathing problems  -changes in hearing  -gout pain  -low blood counts - This drug may decrease the number of white blood cells, red blood cells and platelets. You may be at increased risk for infections and bleeding.  -nausea and vomiting  -pain, swelling, redness or irritation at the  injection site  -pain, tingling, numbness in the hands or feet  -problems with balance, movement  -trouble passing urine or change in the amount of urine  Side effects that usually do not require medical attention (report to your doctor or health care professional if they continue or are bothersome):  -changes in vision  -loss of appetite  -metallic taste in the mouth or changes in taste  This list may not describe all possible side effects. Call your doctor for medical advice about side effects. You may report side effects to FDA at 1-800-FDA-1088.  Where should I keep my medicine?  This drug is given in a hospital or clinic and will not be stored at home.  NOTE: This sheet is a summary. It may not cover all possible information. If you have questions about this medicine, talk to your doctor, pharmacist, or health care provider.      2016, Elsevier/Gold Standard. (2007-06-07 14:40:54)

## 2015-12-16 ENCOUNTER — Encounter
Admission: RE | Disposition: A | Discharge status: Home / Self Care | Payer: Self-pay | Source: Ambulatory Visit | Attending: Vascular Surgery

## 2015-12-16 ENCOUNTER — Ambulatory Visit
Admission: RE | Admit: 2015-12-16 | Discharge: 2015-12-16 | Disposition: A | Discharge status: Home / Self Care | Payer: BLUE CROSS/BLUE SHIELD | Source: Ambulatory Visit | Attending: Vascular Surgery | Admitting: Vascular Surgery

## 2015-12-16 DIAGNOSIS — F17210 Nicotine dependence, cigarettes, uncomplicated: Secondary | ICD-10-CM | POA: Insufficient documentation

## 2015-12-16 DIAGNOSIS — C6212 Malignant neoplasm of descended left testis: Secondary | ICD-10-CM | POA: Diagnosis not present

## 2015-12-16 DIAGNOSIS — Z9079 Acquired absence of other genital organ(s): Secondary | ICD-10-CM | POA: Insufficient documentation

## 2015-12-16 DIAGNOSIS — K219 Gastro-esophageal reflux disease without esophagitis: Secondary | ICD-10-CM | POA: Insufficient documentation

## 2015-12-16 HISTORY — PX: PERIPHERAL VASCULAR CATHETERIZATION: SHX172C

## 2015-12-16 SURGERY — PORTA CATH INSERTION
Anesthesia: Moderate Sedation

## 2015-12-16 MED ORDER — MIDAZOLAM HCL 5 MG/5ML IJ SOLN
INTRAMUSCULAR | Status: AC
  Filled 2015-12-16: qty 5

## 2015-12-16 MED ORDER — FENTANYL CITRATE (PF) 100 MCG/2ML IJ SOLN
INTRAMUSCULAR | Status: DC | PRN
  Administered 2015-12-16 (×2): 50 ug via INTRAVENOUS

## 2015-12-16 MED ORDER — SODIUM CHLORIDE 0.9 % IR SOLN
Freq: Once | Status: DC
  Filled 2015-12-16: qty 2

## 2015-12-16 MED ORDER — SODIUM CHLORIDE 0.9 % IV SOLN
INTRAVENOUS | Status: DC
  Administered 2015-12-16: 12:00:00 via INTRAVENOUS

## 2015-12-16 MED ORDER — DEXTROSE 5 % IV SOLN
1.5000 g | INTRAVENOUS | Status: AC
  Administered 2015-12-16: 1.5 g via INTRAVENOUS

## 2015-12-16 MED ORDER — MIDAZOLAM HCL 2 MG/2ML IJ SOLN
INTRAMUSCULAR | Status: AC
  Filled 2015-12-16: qty 2

## 2015-12-16 MED ORDER — HYDROMORPHONE HCL 1 MG/ML IJ SOLN: 1.0000 mg | Freq: Once | INTRAMUSCULAR | Status: DC

## 2015-12-16 MED ORDER — HEPARIN (PORCINE) IN NACL 2-0.9 UNIT/ML-% IJ SOLN
INTRAMUSCULAR | Status: AC
  Filled 2015-12-16: qty 500

## 2015-12-16 MED ORDER — LIDOCAINE-EPINEPHRINE (PF) 1 %-1:200000 IJ SOLN
INTRAMUSCULAR | Status: AC
  Filled 2015-12-16: qty 30

## 2015-12-16 MED ORDER — FENTANYL CITRATE (PF) 100 MCG/2ML IJ SOLN
INTRAMUSCULAR | Status: AC
  Filled 2015-12-16: qty 2

## 2015-12-16 MED ORDER — MIDAZOLAM HCL 2 MG/2ML IJ SOLN
INTRAMUSCULAR | Status: DC | PRN
  Administered 2015-12-16: 2 mg via INTRAVENOUS
  Administered 2015-12-16 (×2): 1 mg via INTRAVENOUS
  Administered 2015-12-16: 2 mg via INTRAVENOUS

## 2015-12-16 MED ORDER — ONDANSETRON HCL 4 MG/2ML IJ SOLN: 4.0000 mg | Freq: Four times a day (QID) | INTRAMUSCULAR | Status: DC | PRN

## 2015-12-16 SURGICAL SUPPLY — 10 items
BAG DECANTER STRL (MISCELLANEOUS) ×2 IMPLANT
KIT PORT POWER 8FR ISP CVUE (Catheter) ×2 IMPLANT
PACK ANGIOGRAPHY (CUSTOM PROCEDURE TRAY) ×2 IMPLANT
PAD GROUND ADULT SPLIT (MISCELLANEOUS) ×2 IMPLANT
PENCIL ELECTRO HAND CTR (MISCELLANEOUS) ×2 IMPLANT
PREP CHG 10.5 TEAL (MISCELLANEOUS) ×2 IMPLANT
SUT MNCRL AB 4-0 PS2 18 (SUTURE) ×2 IMPLANT
SUT PROLENE 0 CT 1 30 (SUTURE) ×2 IMPLANT
SUTURE VIC 3-0 (SUTURE) ×2 IMPLANT
TOWEL OR 17X26 4PK STRL BLUE (TOWEL DISPOSABLE) ×2 IMPLANT

## 2015-12-16 NOTE — H&P (Signed)
[facility] History & Physical Update  The patient was interviewed and re-examined.  The patient's previous History and Physical has been reviewed and is unchanged.  There is no change in the plan of care. We plan to proceed with the scheduled procedure.  Burech Mcfarland, MD  12/16/2015, 11:34 AM    

## 2015-12-16 NOTE — Progress Notes (Signed)
Pt clinically stable post procedure, site unremarkable, mother present, Dr Lucky Cowboy out to speak with family/patient, questions answered taking po's and eating lunch, tolerated well. Discharge instructions given with questions answered.

## 2015-12-16 NOTE — Op Note (Signed)
      [facility]       Operative Note  Date: 12/16/2015  Preoperative diagnosis:  1. Testicular cancer  Postoperative diagnosis:  Same as above  Procedures: #1. Ultrasound guidance for vascular access to the right internal jugular vein. #2. Fluoroscopic guidance for placement of catheter. #3. Placement of CT compatible Port-A-Cath, right internal jugular vein.  Surgeon: Leotis Pain, MD.   Anesthesia: Local with moderate conscious sedation for approximately 20  minutes using 6 mg of Versed and 100 mcg of Fentanyl  Fluoroscopy time: less than 1 minute  Contrast used: 0  Estimated blood loss: 20 cc  Indication for the procedure:  The patient is a 25 y.o.male with testicular cancer.  The patient needs a Port-A-Cath for durable venous access, chemotherapy, lab draws, and CT scans. We are asked to place this. Risks and benefits were discussed and informed consent was obtained.  Description of procedure: The patient was brought to the vascular and interventional radiology suite.  Moderate conscious sedation was administered throughout the procedure during a face to face encounter with the patient with my supervision of the RN administering medicines and monitoring the patient's vital signs, pulse oximetry, telemetry and mental status throughout from the start of the procedure until the patient was taken to the recovery room. The right neck chest and shoulder were sterilely prepped and draped, and a sterile surgical field was created. Ultrasound was used to help visualize a patent right internal jugular vein. This was then accessed under direct ultrasound guidance without difficulty with the Seldinger needle and a permanent image was recorded. A J-wire was placed. After skin nick and dilatation, the peel-away sheath was then placed over the wire. I then anesthetized an area under the clavicle approximately 1-2 fingerbreadths. A transverse incision was created and an inferior  pocket was created with electrocautery and blunt dissection. The port was then brought onto the field, placed into the pocket and secured to the chest wall with 2 Prolene sutures. The catheter was connected to the port and tunneled from the subclavicular incision to the access site. Fluoroscopic guidance was then used to cut the catheter to an appropriate length. The catheter was then placed through the peel-away sheath and the peel-away sheath was removed. The catheter tip was parked in excellent location under fluorocoscopic guidance in the cavoatrial junction area. The pocket was then irrigated with antibiotic impregnated saline and the wound was closed with a running 3-0 Vicryl and a 4-0 Monocryl. The access incision was closed with a single 4-0 Monocryl. The Huber needle was used to withdraw blood and flush the port with heparinized saline. Dermabond was then placed as a dressing. The patient tolerated the procedure well and was taken to the recovery room in stable condition.   Leotis Pain 12/16/2015 1:26 PM

## 2015-12-16 NOTE — Discharge Instructions (Signed)
, Care After °Refer to this sheet in the next few weeks. These instructions provide you with information on caring for yourself after your procedure. Your caregiver may also give you more specific instructions. Your treatment has been planned according to current medical practices, but problems sometimes occur. Call your caregiver if you have any problems or questions after your procedure.  °HOME CARE INSTRUCTIONS °· Rest at home the day of the procedure. You will likely be able to return to normal activities the following day. °· Follow your caregiver's specific instructions for the type of device that you have. °· Only take over-the-counter or prescription medicines as directed by your caregiver. °· Keep the insertion site of the catheter clean and dry at all times. °¨ Change the bandages (dressings) over the catheter site as directed by your caregiver. °¨ Wash the area around the catheter site during each dressing change. Sponge bathe the area using a germ-killing (antiseptic) solution as directed by your caregiver. °¨ Look for redness or swelling at the insertion site during each dressing change. °· Apply an antibiotic ointment as directed by your caregiver. °· Flush your catheter as directed to keep it from becoming clogged. °· Always wash your hands thoroughly before changing dressings or flushing the catheter. °· Do not let air enter the catheter. °¨ Never open the cap at the catheter tip. °¨ Always make sure there is no air in the syringe or in the tubing for infusions.    °· Do not lift anything heavy. °· Do not drive until your caregiver approves. °· Do not shower or bathe until your caregiver approves. When you shower or bathe, place a piece of plastic wrap over the catheter site. Do not allow the catheter site or the dressing to get wet. If taking a bath, do not allow the catheter to get submerged in the water. °If the catheter was inserted through an arm vein:  °· Avoid wearing tight clothes or jewelry  on the arm that has the catheter.   °· Do not sleep with your head on the arm that has the catheter.   °· Do not allow use of a blood pressure cuff on the arm that has the catheter.   °· Do not let anyone draw blood from the arm that has the catheter, except through the catheter itself. °SEEK MEDICAL CARE IF: °· You have bleeding at the insertion site of the catheter.   °· You feel weak or nauseous.   °· Your catheter is not working properly.   °· You have redness, pain, swelling, and warmth at the insertion site.   °· You notice fluid draining from the insertion site.   °SEEK IMMEDIATE MEDICAL CARE IF: °· Your catheter breaks or has a hole in it.   °· Your catheter comes loose or gets pulled completely out. If this happens, hold firm pressure over the area with your hand or a clean cloth.   °· You have a fever. °· You have chills.   °· Your catheter becomes totally blocked.   °· You have swelling in your arm, shoulder, neck, or face.   °· You have bleeding from the insertion site that does not stop.   °· You develop chest pain or have trouble breathing.   °· You feel dizzy or faint.   °MAKE SURE YOU: °· Understand these instructions. °· Will watch your condition. °· Will get help right away if you are not doing well or get worse. °  °This information is not intended to replace advice given to you by your health care provider. Make sure you discuss any questions you   have with your health care provider. °  °Document Released: 02/17/2012 Document Revised: 11/02/2012 Document Reviewed: 02/17/2012 °Elsevier Interactive Patient Education ©2016 [employer] ° °

## 2015-12-17 ENCOUNTER — Ambulatory Visit
Admission: RE | Admit: 2015-12-17 | Discharge: 2015-12-17 | Disposition: A | Discharge status: Home / Self Care | Payer: BLUE CROSS/BLUE SHIELD | Source: Ambulatory Visit | Attending: Urology | Admitting: Urology

## 2015-12-17 ENCOUNTER — Inpatient Hospital Stay: Payer: BLUE CROSS/BLUE SHIELD | Attending: Hematology and Oncology

## 2015-12-17 ENCOUNTER — Telehealth: Payer: Self-pay

## 2015-12-17 ENCOUNTER — Encounter: Payer: Self-pay | Admitting: Vascular Surgery

## 2015-12-17 DIAGNOSIS — R42 Dizziness and giddiness: Secondary | ICD-10-CM | POA: Insufficient documentation

## 2015-12-17 DIAGNOSIS — C6212 Malignant neoplasm of descended left testis: Secondary | ICD-10-CM | POA: Insufficient documentation

## 2015-12-17 DIAGNOSIS — Z9079 Acquired absence of other genital organ(s): Secondary | ICD-10-CM | POA: Insufficient documentation

## 2015-12-17 DIAGNOSIS — K219 Gastro-esophageal reflux disease without esophagitis: Secondary | ICD-10-CM | POA: Insufficient documentation

## 2015-12-17 DIAGNOSIS — Z800 Family history of malignant neoplasm of digestive organs: Secondary | ICD-10-CM | POA: Insufficient documentation

## 2015-12-17 DIAGNOSIS — Z808 Family history of malignant neoplasm of other organs or systems: Secondary | ICD-10-CM | POA: Insufficient documentation

## 2015-12-17 DIAGNOSIS — R110 Nausea: Secondary | ICD-10-CM | POA: Insufficient documentation

## 2015-12-17 DIAGNOSIS — F17210 Nicotine dependence, cigarettes, uncomplicated: Secondary | ICD-10-CM | POA: Insufficient documentation

## 2015-12-17 DIAGNOSIS — Z803 Family history of malignant neoplasm of breast: Secondary | ICD-10-CM | POA: Insufficient documentation

## 2015-12-17 DIAGNOSIS — Z5111 Encounter for antineoplastic chemotherapy: Secondary | ICD-10-CM | POA: Insufficient documentation

## 2015-12-17 MED ORDER — GADOBENATE DIMEGLUMINE 529 MG/ML IV SOLN
20.0000 mL | Freq: Once | INTRAVENOUS | Status: AC | PRN
  Administered 2015-12-17: 20 mL via INTRAVENOUS

## 2015-12-17 NOTE — Telephone Encounter (Signed)
-----   Message from [name], MD sent at 12/17/2015 10:57 AM EDT ----- Normal brain MRI!  Great news.   [name], MD

## 2015-12-17 NOTE — Telephone Encounter (Signed)
LMOM

## 2015-12-18 ENCOUNTER — Other Ambulatory Visit: Payer: Self-pay

## 2015-12-18 DIAGNOSIS — Z8547 Personal history of malignant neoplasm of testis: Secondary | ICD-10-CM

## 2015-12-18 NOTE — Telephone Encounter (Signed)
LMOM

## 2015-12-19 ENCOUNTER — Other Ambulatory Visit: Payer: BLUE CROSS/BLUE SHIELD

## 2015-12-19 DIAGNOSIS — Z8547 Personal history of malignant neoplasm of testis: Secondary | ICD-10-CM

## 2015-12-20 ENCOUNTER — Telehealth: Payer: Self-pay | Admitting: Family Medicine

## 2015-12-20 DIAGNOSIS — C6212 Malignant neoplasm of descended left testis: Secondary | ICD-10-CM

## 2015-12-20 LAB — LACTATE DEHYDROGENASE: LDH: 179 IU/L (ref 121–224)

## 2015-12-20 LAB — AFP TUMOR MARKER: AFP-Tumor Marker: 11.2 ng/mL — ABNORMAL HIGH (ref 0.0–8.3)

## 2015-12-20 LAB — BETA HCG QUANT (REF LAB): hCG Quant: 2 m[IU]/mL (ref 0–3)

## 2015-12-20 NOTE — Telephone Encounter (Signed)
Patient notified

## 2015-12-20 NOTE — Telephone Encounter (Signed)
Left message for patient to call back  

## 2015-12-20 NOTE — Telephone Encounter (Signed)
-----   Message from [name], MD sent at 12/17/2015 10:57 AM EDT ----- Normal brain MRI!  Great news.   [name], MD

## 2015-12-20 NOTE — Telephone Encounter (Signed)
-----   Message from [name], MD sent at 12/20/2015  1:42 PM EDT ----- Please let Dorothea Ogle know that his labs look really good. The only one that is mildly elevated still is AFP which is borderline elevated. I would like him to come in next week to repeat the AFP to ensure that this is real and not still just getting out of his system. Please arrange for lab visit for this blood draw.  [name], MD

## 2015-12-22 NOTE — Progress Notes (Addendum)
[place] [facility] day:  12/23/2015  Chief Complaint: Edgar [name] is a 25 y.o. male with stage IIA testicular cancer who is seen for assessment prior to cycle #1 BEP.  HPI: The patient was last seen in the medical oncology clinic on 12/10/2015.  At that time, he was seen for initial consultation.  He was s/p left radical orchiectomy on 11/26/2015.  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  Chest, abdomen, and pelvic CT scan on 11/28/2015 revealed 2 prominent left periaortic retroperitoneal lymph nodes (1.7 cm and 0.9 cm).  Pre surgery labs on 11/20/2015 revealed an AFP of 411.9, beta-HCG 2,669, and LDH 522 (121-224).    He had undergone pulmonary function testing on 12/10/2015, but were not available at last appointment.  He was scheduled for head MRI on 12/17/2015 and repeat labs on 12/19/2015.  Pulmonary function testing revealed a FVC 4.71 liters (76% predicted), FEV1 4.18 liters (88% predicted), TLC 7.23 liters (87% predicted), VC 4.88 (79% predicted) and DLCO of 39.3 mL/mmHg/min (100% predicted).  There was no evidence of obstruction or restrictive lung disease.  Recommendation was for weight loss.  Head MRI on 12/17/2015 revealed no evidence of metastatic disease.  Labs on 12/19/2015 revealed an AFP of 11.2 (0-8.3), beta-HCG of 2 (0-3), and LDH 179 (121-224).  Audiogram was not performed.  He underwent port-a-cath placement on 12/16/2015 by Dr. Lucky Cowboy.  Symptomatically, he feels well.  He notes that his neck is a little stiff from the port placement.   Past Medical History:  Diagnosis Date  . Asthma    AS A CHILD-NO INHALERS  . GERD (gastroesophageal reflux disease)    TUMS PRN    Past Surgical History:  Procedure Laterality Date  . ANKLE FRACTURE SURGERY Right 2004  . ORCHIECTOMY Left 11/26/2015   Procedure: ORCHIECTOMY;   Surgeon: [name], MD;  Location: [facility] ORS;  Service: Urology;  Laterality: Left;  radical/Inguinal approach  . PERIPHERAL VASCULAR CATHETERIZATION N/A 12/16/2015   Procedure: [name] Cath Insertion;  Surgeon: [name], MD;  Location: [place] [facility];  Service: Cardiovascular;  Laterality: N/A;  . WISDOM TOOTH EXTRACTION  2017    Family History  Problem Relation Age of Onset  . Skin cancer Mother   . Breast cancer Maternal Grandmother   . Colon cancer Maternal Grandfather   . Diabetes Maternal Grandfather   . Prostate cancer Neg Hx   . Kidney cancer Neg Hx   . Bladder Cancer Neg Hx     Social History:  reports that he has been smoking Cigarettes.  He has a 4.00 pack-year smoking history. He has quit using smokeless tobacco. His smokeless tobacco use included Snuff. He reports that he uses drugs, including Marijuana. He reports that he does not drink alcohol.  He smokes 1/2 pack a day.  He smokes marijuana.  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  He has a 42 year-old-daughter named [name]. The patient's mother's cell phone number is [phone]. The patient is accompanied by his mother today.  Allergies: No Known Allergies  Current Medications: Current Outpatient Prescriptions  Medication Sig Dispense Refill  . acetaminophen (TYLENOL) 325 MG tablet Take 650 mg by mouth every 6 (six) hours as needed.    . lidocaine-prilocaine (EMLA) cream Apply small amount of cream over the portacath site  1 hour before chemotherapy treatment, place saran wrap over the cream to protect your clothing 30 g 1   No current facility-administered medications for this visit.    Facility-Administered Medications Ordered in Other Visits  Medication Dose Route Frequency Provider Last Rate Last Dose  . heparin lock flush 100 unit/mL  500 Units Intravenous Once [name], MD      . sodium chloride flush (NS) 0.9 % injection 10 mL  10 mL Intravenous Once  [name], MD        Review of Systems:  GENERAL:  Feels "alright".  No fevers, sweats or weight loss. PERFORMANCE STATUS (ECOG):  1 HEENT:  No visual changes, runny nose, sore throat, mouth sores or tenderness. Lungs: No shortness of breath or cough.  No hemoptysis. Cardiac:  No chest pain, palpitations, orthopnea, or PND. GI:  No nausea, vomiting, diarrhea, constipation, melena or hematochezia. GU:  No urgency, frequency, dysuria, or hematuria. Musculoskeletal:  No back pain.  No joint pain.  No muscle tenderness. Extremities:  No pain or swelling. Skin:  No rashes or skin changes. Neuro:  No headache, numbness or weakness, balance or coordination issues. Endocrine:  No diabetes, thyroid issues, hot flashes or night sweats. Psych:  No mood changes, depression or anxiety. Pain:  No pain. Review of systems:  All other systems reviewed and found to be negative.  Physical Exam: Blood pressure 129/82, pulse (!) 57, temperature (!) 96.4 F (35.8 C), temperature source Tympanic, resp. rate 18, weight 272 lb 7.8 oz (123.6 kg). GENERAL:  Well developed, well nourished, gentleman sitting comfortably in the exam room in no acute distress. MENTAL STATUS:  Alert and oriented to person, place and time. HEAD:  Brown hair and full beard.  Normocephalic, atraumatic, face symmetric, no Cushingoid features. EYES:  Pupils equal round and reactive to light and accomodation.  No conjunctivitis or scleral icterus. ENT:  Oropharynx clear without lesion.  Tongue normal. Mucous membranes moist.  RESPIRATORY:  Clear to auscultation without rales, wheezes or rhonchi. CARDIOVASCULAR:  Regular rate and rhythm without murmur, rub or gallop. ABDOMEN:  Soft, non-tender, with active bowel sounds, and no hepatosplenomegaly.  No masses. SKIN:  No rashes, ulcers or lesions. EXTREMITIES: No edema, no skin discoloration or tenderness.  No palpable cords. LYMPH NODES: No palpable cervical, supraclavicular,  axillary or inguinal adenopathy  NEUROLOGICAL: Unremarkable. PSYCH:  Appropriate.   Infusion on 12/23/2015  Component Date Value Ref Range Status  . WBC 12/23/2015 6.3  3.8 - 10.6 K/uL Final  . RBC 12/23/2015 4.54  4.40 - 5.90 MIL/uL Final  . Hemoglobin 12/23/2015 13.5  13.0 - 18.0 g/dL Final  . HCT 12/23/2015 39.7* 40.0 - 52.0 % Final  . MCV 12/23/2015 87.6  80.0 - 100.0 fL Final  . MCH 12/23/2015 29.8  26.0 - 34.0 pg Final  . MCHC 12/23/2015 34.0  32.0 - 36.0 g/dL Final  . RDW 12/23/2015 12.8  11.5 - 14.5 % Final  . Platelets 12/23/2015 278  150 - 440 K/uL Final  . Neutrophils Relative % 12/23/2015 52  % Final  . Neutro Abs 12/23/2015 3.3  1.4 - 6.5 K/uL Final  . Lymphocytes Relative 12/23/2015 26  % Final  . Lymphs Abs 12/23/2015 1.6  1.0 - 3.6 K/uL Final  . Monocytes Relative 12/23/2015 12  % Final  . Monocytes Absolute 12/23/2015 0.8  0.2 - 1.0 K/uL Final  . Eosinophils Relative 12/23/2015 9  % Final  . Eosinophils Absolute 12/23/2015 0.6  0 -  0.7 K/uL Final  . Basophils Relative 12/23/2015 1  % Final  . Basophils Absolute 12/23/2015 0.1  0 - 0.1 K/uL Final    Assessment:  Edgar [name] is a 25 y.o. male with stage IIA left testicular cancer s/p left radical orchiectomy on 11/26/2015.  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  Pathologic stage is [id number].  Pre surgery labs on 11/20/2015 revealed an AFP of 411.9, beta-HCG 2,669, and LDH 522 (121-224).  Labs on 12/19/2015 revealed an AFP of 11.2 (0-8.3), beta-HCG of 2 (0-3), and LDH 179 (121-224).  Labs on 12/23/2015 revealed an AFP of 6.3, beta-HCG of 1, and LDH 160.  Chest, abdomen, and pelvic CT scan on 11/28/2015 revealed no mediastinal adenopathy or suspicious pulmonary nodules.   There were 2 prominent left periaortic retroperitoneal lymph nodes (1.7 cm and 0.9 cm) concerning for testicular nodal  metastasis.  There were no abnormal lymph nodes above the renal veins.  There were postsurgical change in the left hemiscrotum and left inguinal canal.  Head MRI on 12/17/2015 revealed no evidence of metastatic disease.    Pulmonary function testing revealed a FVC 4.71 liters (76% predicted), FEV1 4.18 liters (88% predicted), TLC 7.23 liters (87% predicted), VC 4.88 (79% predicted) and DLCO of 39.3 mL/mmHg/min (100% predicted).  He is undecided about sperm banking.  Symptomatically, he feels denies any systemic symptoms.  Exam is unremarkable.  Plan: 1.  Discuss results of head MRI and pulmonary function tests.  Head MRI negative.  PFTs revealed a normal DLCO. 2.  Discuss repeat tumor markers (beta-HCG and LDH normal; AFP elevated).  Discuss current current S1 markers, but could have additional drop in AFP given 1/2 life of 5-7 days (25-35 days post op).    Discuss treatment option of RPLND if markers return to baseline (S0) or chemotherapy (3 cycles of BEP/4 cycles EP).  Discuss potential side effects associated with RPLND as compared to chemotherapy.  Discuss consideration of referral to a tertiary care urologist with experience in nerve sparing RPLND.  Discuss lymph node data from RPLND and direction of therapy (N0- surveillance; N1- surveillance (preferred) versus 2 cycles BEP/EP; N2- 2 cycles EP/BEP (preferred) versus surveillance (> 50% relapse).  RPLND curative in 60-90% N1 disease.  Complications of RPLND can include sexual dysfunction 1-15%, > 90% maintain antegrade ejaculation, fertility rate (62% nerve sparing; 37% non-nerve sparing).  Complication rate best deferred to surgeon performing procedure.  Discuss sperm banking.  Patient has not decided if he is going to pursue (cost).  Discuss pretreatment statistics:  azoospermia or oligospermia in 30% of patients; impaired sperm motility in 50%.  Recovery of some spermatogenesis 48% and normal 22% at 2 years.  Recovery of some spermatogenesis 80%  and normal 58% at 5 years.  If normal spermatogenesis prior to treatment, 64% normal at 1 year.  3.  Discuss postponing treatment today.  Await AFP results.  If AFP normal, will refer for RPLND consultation.  If AFP still elevated, but decreasing, will repeat within the next week.  If AFP plateaued or rising, will proceed with chemotherapy (BEP). 4.  Contact patient or mom with AFP results. 5.  RTC in 1 week for MD assess, labs, and possible cycle #1 BEP.  Addendum:  AFP today is 6.3 (normal).  Tumor markers area all normal (S0).  Patient was discussed at tumor board.  Potential concern for a 3.3 cm  soft tissue abnormality anterior to the left iliopsoas muscle. This may represent a metastatic lymph node (N2 lesion) which would upstage to stage IIB.  Will defer any additional imaging to tertiary care facility and eligibility for RPLND.   [name], MD  12/23/2015, 9:14 AM

## 2015-12-23 ENCOUNTER — Other Ambulatory Visit: Payer: Self-pay | Admitting: *Deleted

## 2015-12-23 ENCOUNTER — Inpatient Hospital Stay (HOSPITAL_BASED_OUTPATIENT_CLINIC_OR_DEPARTMENT_OTHER): Payer: BLUE CROSS/BLUE SHIELD | Admitting: Hematology and Oncology

## 2015-12-23 ENCOUNTER — Inpatient Hospital Stay: Payer: BLUE CROSS/BLUE SHIELD

## 2015-12-23 ENCOUNTER — Encounter: Payer: Self-pay | Admitting: Hematology and Oncology

## 2015-12-23 VITALS — BP 129/82 | HR 57 | Temp 96.4°F | Resp 18 | Wt 272.5 lb

## 2015-12-23 DIAGNOSIS — C6212 Malignant neoplasm of descended left testis: Secondary | ICD-10-CM

## 2015-12-23 DIAGNOSIS — K219 Gastro-esophageal reflux disease without esophagitis: Secondary | ICD-10-CM | POA: Diagnosis not present

## 2015-12-23 DIAGNOSIS — Z5111 Encounter for antineoplastic chemotherapy: Secondary | ICD-10-CM | POA: Diagnosis not present

## 2015-12-23 DIAGNOSIS — F17210 Nicotine dependence, cigarettes, uncomplicated: Secondary | ICD-10-CM | POA: Diagnosis not present

## 2015-12-23 DIAGNOSIS — Z9079 Acquired absence of other genital organ(s): Secondary | ICD-10-CM

## 2015-12-23 DIAGNOSIS — Z808 Family history of malignant neoplasm of other organs or systems: Secondary | ICD-10-CM | POA: Diagnosis not present

## 2015-12-23 DIAGNOSIS — Z803 Family history of malignant neoplasm of breast: Secondary | ICD-10-CM | POA: Diagnosis not present

## 2015-12-23 DIAGNOSIS — Z800 Family history of malignant neoplasm of digestive organs: Secondary | ICD-10-CM

## 2015-12-23 DIAGNOSIS — R110 Nausea: Secondary | ICD-10-CM | POA: Diagnosis not present

## 2015-12-23 DIAGNOSIS — R42 Dizziness and giddiness: Secondary | ICD-10-CM | POA: Diagnosis not present

## 2015-12-23 LAB — CBC WITH DIFFERENTIAL/PLATELET
Basophils Absolute: 0.1 10*3/uL (ref 0–0.1)
Basophils Relative: 1 %
Eosinophils Absolute: 0.6 10*3/uL (ref 0–0.7)
Eosinophils Relative: 9 %
HCT: 39.7 % — ABNORMAL LOW (ref 40.0–52.0)
Hemoglobin: 13.5 g/dL (ref 13.0–18.0)
Lymphocytes Relative: 26 %
Lymphs Abs: 1.6 10*3/uL (ref 1.0–3.6)
MCH: 29.8 pg (ref 26.0–34.0)
MCHC: 34 g/dL (ref 32.0–36.0)
MCV: 87.6 fL (ref 80.0–100.0)
Monocytes Absolute: 0.8 10*3/uL (ref 0.2–1.0)
Monocytes Relative: 12 %
Neutro Abs: 3.3 10*3/uL (ref 1.4–6.5)
Neutrophils Relative %: 52 %
Platelets: 278 10*3/uL (ref 150–440)
RBC: 4.54 MIL/uL (ref 4.40–5.90)
RDW: 12.8 % (ref 11.5–14.5)
WBC: 6.3 10*3/uL (ref 3.8–10.6)

## 2015-12-23 LAB — COMPREHENSIVE METABOLIC PANEL
ALT: 33 U/L (ref 17–63)
AST: 23 U/L (ref 15–41)
Albumin: 3.9 g/dL (ref 3.5–5.0)
Alkaline Phosphatase: 60 U/L (ref 38–126)
Anion gap: 6 (ref 5–15)
BUN: 14 mg/dL (ref 6–20)
CO2: 28 mmol/L (ref 22–32)
Calcium: 8.9 mg/dL (ref 8.9–10.3)
Chloride: 105 mmol/L (ref 101–111)
Creatinine, Ser: 0.79 mg/dL (ref 0.61–1.24)
GFR calc Af Amer: >60 mL/min (ref >60)
GFR calc non Af Amer: >60 mL/min (ref >60)
Glucose, Bld: 98 mg/dL (ref 65–99)
Potassium: 4.2 mmol/L (ref 3.5–5.1)
Sodium: 139 mmol/L (ref 135–145)
Total Bilirubin: 0.5 mg/dL (ref 0.3–1.2)
Total Protein: 6.9 g/dL (ref 6.5–8.1)

## 2015-12-23 LAB — LACTATE DEHYDROGENASE: LDH: 160 U/L (ref 98–192)

## 2015-12-23 LAB — MAGNESIUM: Magnesium: 2 mg/dL (ref 1.7–2.4)

## 2015-12-23 MED ORDER — SODIUM CHLORIDE 0.9% FLUSH
10.0000 mL | Freq: Once | INTRAVENOUS | Status: AC
  Administered 2015-12-23: 10 mL via INTRAVENOUS
  Filled 2015-12-23: qty 10

## 2015-12-23 MED ORDER — HEPARIN SOD (PORK) LOCK FLUSH 100 UNIT/ML IV SOLN
500.0000 [IU] | Freq: Once | INTRAVENOUS | Status: AC
  Administered 2015-12-23: 500 [IU] via INTRAVENOUS
  Filled 2015-12-23: qty 5

## 2015-12-23 NOTE — Telephone Encounter (Signed)
Spoke with pt in reference to lab results. Made aware Dr. Erlene Quan would like to have lab drawn again. Pt voiced understanding. Lab appt made and orders placed.

## 2015-12-23 NOTE — Progress Notes (Signed)
Patient states he had some nausea yesterday but thinks it is because of having a lot on his mind.  Otherwise, no complaints.

## 2015-12-24 ENCOUNTER — Telehealth: Payer: Self-pay | Admitting: *Deleted

## 2015-12-24 ENCOUNTER — Inpatient Hospital Stay: Payer: BLUE CROSS/BLUE SHIELD

## 2015-12-24 ENCOUNTER — Other Ambulatory Visit: Payer: Self-pay

## 2015-12-24 LAB — BETA HCG QUANT (REF LAB): Beta hCG, Tumor Marker: 1 m[IU]/mL (ref 0–3)

## 2015-12-24 LAB — AFP TUMOR MARKER: AFP-Tumor Marker: 6.3 ng/mL (ref 0.0–8.3)

## 2015-12-24 NOTE — Telephone Encounter (Signed)
-----   Message from [name], MD sent at 12/24/2015 12:25 PM EDT ----- Regarding: Please call patient  AFP normal. Spoke to Dr. Erlene Quan.  She does not do RPLND.  Dr. Erlene Quan is helping with referral to [facility]  M

## 2015-12-24 NOTE — Telephone Encounter (Signed)
Called and spoke to mother and let her know about the AFp results were normal.  Mother asked since it is normal what now.  I told her corcoran spoke to brandon yest evening and Erlene Quan had told her that in case conference they spoke about a third lymph node and when corcoran read the results she did not see a mention of this.  Mother states that Dr. Erlene Quan told them about the 3 lymph nodes. Mike Gip is trying to speak to radiologist about this and see if report needs to be an addendum made.  She also asked about the lymph node dissection and I told her that dr. Erlene Quan did not perform this and we are trying to get him set up [facility]. Dr. Mike Gip is on phone speaking to a doctor and I will call her back about it.

## 2015-12-24 NOTE — Telephone Encounter (Signed)
Dr. Mike Gip got in touch with Dr. Montine Circle at [facility] and he is not the person to do the surgery but the doctor is Dr. Elyse Hsu and Dr. Judy Pimple is in the same dept and will speak to the doctor that pt needs to see and call with appt..  They understand the need for urgency to get pt to be seen.  Phone # [phone]. Fax # [phone].  Dr. Mike Gip did send test to Dr. Ricky Ala to ask them to call mother with appt since pt will be at work most of the day and will not be able to get call.  I told her that we would try but no guarantee if it will work and she understands. I gave her my cell phone that she can call if they do not hear something by tom. Noon time. Dr. Mike Gip spoke to mother on same phone call and then I spoke to her to give her numbers in case she doe snot get call tom.

## 2015-12-25 ENCOUNTER — Inpatient Hospital Stay: Payer: BLUE CROSS/BLUE SHIELD

## 2015-12-25 ENCOUNTER — Telehealth: Payer: Self-pay | Admitting: Hematology and Oncology

## 2015-12-25 NOTE — Telephone Encounter (Signed)
Edgar [name] called to request additional info. She asked that we fax over labs, pathology report, orchiectomy report, and demographics including insurance card images. She has tentatively set up an appt for [facility] with Dr. Sherral Hammers on 01/13/16 @ 10am followed by an appt with Dr. Tonny Branch at 11:20. She has spoken w/pt mother and informed of appts and will try to move them to sooner appts as well. She mailed new patient packet to [place]. I am sending requested information today via fax. Thanks.

## 2015-12-26 ENCOUNTER — Inpatient Hospital Stay: Payer: BLUE CROSS/BLUE SHIELD

## 2015-12-27 ENCOUNTER — Inpatient Hospital Stay: Payer: BLUE CROSS/BLUE SHIELD

## 2015-12-29 ENCOUNTER — Other Ambulatory Visit: Payer: Self-pay | Admitting: Hematology and Oncology

## 2015-12-29 NOTE — Progress Notes (Signed)
[place] [facility] day:  12/30/2015  Chief Complaint: Edgar [name] is a 25 y.o. male with stage IIB testicular cancer who is seen for assessment prior to cycle #1 BEP.  HPI: The patient was last seen in the medical oncology clinic on 12/23/2015.  At that time, we discussed results from his PFTs, head MRI, and labs.  AFP on 12/19/2015 was 11.2 (0-8.3).  We discussed the possibility of retroperitoneal lymph node dissection (RPLND).  AFP returned 6.3 (normal).  We also discussed his last CT scan which revealed concern for a 3.3 cm soft tissue abnormality anterior to the left iliopsoas muscle.  He was scheduled for an outpatient appointment with Dr. Sherral Hammers at [facility] for evaluation for robotic RPLND.  We also rediscussed sperm banking.  We discussed formal hearing testing again.  During the interim, he has decided not to pursue RPLND.  He states that having surgery does not guarantee that he will not need chemotherapy.  He denies any complaint.  He wishes to proceed with chemotherapy.   Past Medical History:  Diagnosis Date  . Asthma    AS A CHILD-NO INHALERS  . GERD (gastroesophageal reflux disease)    TUMS PRN    Past Surgical History:  Procedure Laterality Date  . ANKLE FRACTURE SURGERY Right 2004  . ORCHIECTOMY Left 11/26/2015   Procedure: ORCHIECTOMY;  Surgeon: [name], MD;  Location: [facility] ORS;  Service: Urology;  Laterality: Left;  radical/Inguinal approach  . PERIPHERAL VASCULAR CATHETERIZATION N/A 12/16/2015   Procedure: [name] Cath Insertion;  Surgeon: [name], MD;  Location: [place] [facility];  Service: Cardiovascular;  Laterality: N/A;  . WISDOM TOOTH EXTRACTION  2017    Family History  Problem Relation Age of Onset  . Skin cancer Mother   . Breast cancer Maternal Grandmother   . Colon cancer Maternal Grandfather   . Diabetes Maternal Grandfather   . Prostate cancer Neg Hx   . Kidney cancer Neg Hx   . Bladder Cancer Neg Hx      Social History:  reports that he has been smoking Cigarettes.  He has a 4.00 pack-year smoking history. He has quit using smokeless tobacco. His smokeless tobacco use included Snuff. He reports that he uses drugs, including Marijuana. He reports that he does not drink alcohol.  He smokes 1/2 pack a day.  He smokes marijuana.  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  He has a 34 year-old-daughter named [name]. The patient's mother's cell phone number is [phone]. The patient is accompanied by his mother today.  Allergies: No Known Allergies  Current Medications: Current Outpatient Prescriptions  Medication Sig Dispense Refill  . acetaminophen (TYLENOL) 325 MG tablet Take 650 mg by mouth every 6 (six) hours as needed.    Marland Kitchen dexamethasone (DECADRON) 4 MG tablet Take 2 tablets by mouth once a day on the day starting on day 6 of chemotherapy and then take 2 tablets two times a day for 2 days. Take with food. 30 tablet 1  . lidocaine-prilocaine (EMLA) cream Apply small amount of cream over the portacath site 1 hour before chemotherapy treatment, place saran wrap over the cream to protect your clothing 30 g 1  . LORazepam (ATIVAN) 0.5 MG tablet Take 1 tablet (0.5 mg total) by mouth every 6 (six) hours as needed (Nausea or vomiting). 30 tablet 0  . ondansetron (ZOFRAN) 8 MG tablet Take 1 tablet (8  mg total) by mouth 2 (two) times daily as needed. Start on day 8 of chemotherapy. 30 tablet 1   No current facility-administered medications for this visit.     Review of Systems:  GENERAL:  Feels fine.  No fevers or sweats.  Weight loss of 5 pounds. PERFORMANCE STATUS (ECOG):  0 HEENT:  No visual changes, runny nose, sore throat, mouth sores or tenderness. Lungs: No shortness of breath or cough.  No hemoptysis. Cardiac:  No chest pain, palpitations, orthopnea, or PND. GI:  No nausea, vomiting, diarrhea, constipation, melena or hematochezia. GU:  No urgency,  frequency, dysuria, or hematuria. Musculoskeletal:  No back pain.  No joint pain.  No muscle tenderness. Extremities:  No pain or swelling. Skin:  No rashes or skin changes. Neuro:  No headache, numbness or weakness, balance or coordination issues. Endocrine:  No diabetes, thyroid issues, hot flashes or night sweats. Psych:  No mood changes, depression or anxiety. Pain:  No pain. Review of systems:  All other systems reviewed and found to be negative.  Physical Exam: Blood pressure 135/77, pulse 91, temperature 97 F (36.1 C), temperature source Tympanic, resp. rate 18, weight 267 lb 13.7 oz (121.5 kg). GENERAL:  Well developed, well nourished, gentleman sitting comfortably in the exam room in no acute distress. MENTAL STATUS:  Alert and oriented to person, place and time. HEAD:  Brown hair and full beard.  Normocephalic, atraumatic, face symmetric, no Cushingoid features. EYES:  Pupils equal round and reactive to light and accomodation.  No conjunctivitis or scleral icterus. ENT:  Oropharynx clear without lesion.  Tongue normal. Mucous membranes moist.  RESPIRATORY:  Clear to auscultation without rales, wheezes or rhonchi. CARDIOVASCULAR:  Regular rate and rhythm without murmur, rub or gallop. ABDOMEN:  Soft, non-tender, with active bowel sounds, and no hepatosplenomegaly.  No masses. SKIN:  No rashes, ulcers or lesions. EXTREMITIES: No edema, no skin discoloration or tenderness.  No palpable cords. LYMPH NODES: No palpable cervical, supraclavicular, axillary or inguinal adenopathy  NEUROLOGICAL: Unremarkable. PSYCH:  Appropriate.   Appointment on 12/30/2015  Component Date Value Ref Range Status  . WBC 12/30/2015 7.1  3.8 - 10.6 K/uL Final  . RBC 12/30/2015 5.07  4.40 - 5.90 MIL/uL Final  . Hemoglobin 12/30/2015 15.3  13.0 - 18.0 g/dL Final  . HCT 12/30/2015 44.8  40.0 - 52.0 % Final  . MCV 12/30/2015 88.4  80.0 - 100.0 fL Final  . MCH 12/30/2015 30.3  26.0 - 34.0 pg Final  .  MCHC 12/30/2015 34.3  32.0 - 36.0 g/dL Final  . RDW 12/30/2015 13.4  11.5 - 14.5 % Final  . Platelets 12/30/2015 254  150 - 440 K/uL Final  . Neutrophils Relative % 12/30/2015 66  % Final  . Neutro Abs 12/30/2015 4.8  1.4 - 6.5 K/uL Final  . Lymphocytes Relative 12/30/2015 20  % Final  . Lymphs Abs 12/30/2015 1.4  1.0 - 3.6 K/uL Final  . Monocytes Relative 12/30/2015 9  % Final  . Monocytes Absolute 12/30/2015 0.6  0.2 - 1.0 K/uL Final  . Eosinophils Relative 12/30/2015 4  % Final  . Eosinophils Absolute 12/30/2015 0.3  0 - 0.7 K/uL Final  . Basophils Relative 12/30/2015 1  % Final  . Basophils Absolute 12/30/2015 0.0  0 - 0.1 K/uL Final  . Sodium 12/30/2015 138  135 - 145 mmol/L Final  . Potassium 12/30/2015 3.8  3.5 - 5.1 mmol/L Final  . Chloride 12/30/2015 108  101 - 111 mmol/L Final  .  CO2 12/30/2015 25  22 - 32 mmol/L Final  . Glucose, Bld 12/30/2015 105* 65 - 99 mg/dL Final  . BUN 12/30/2015 10  6 - 20 mg/dL Final  . Creatinine, Ser 12/30/2015 0.95  0.61 - 1.24 mg/dL Final  . Calcium 12/30/2015 9.2  8.9 - 10.3 mg/dL Final  . Total Protein 12/30/2015 7.9  6.5 - 8.1 g/dL Final  . Albumin 12/30/2015 4.4  3.5 - 5.0 g/dL Final  . AST 12/30/2015 34  15 - 41 U/L Final  . ALT 12/30/2015 47  17 - 63 U/L Final  . Alkaline Phosphatase 12/30/2015 59  38 - 126 U/L Final  . Total Bilirubin 12/30/2015 0.7  0.3 - 1.2 mg/dL Final  . GFR calc non Af Amer 12/30/2015 >60  >60 mL/min Final  . GFR calc Af Amer 12/30/2015 >60  >60 mL/min Final   Comment: (NOTE) The eGFR has been calculated using the CKD EPI equation. This calculation has not been validated in all clinical situations. eGFR's persistently <60 mL/min signify possible Chronic Kidney Disease.   . Anion gap 12/30/2015 5  5 - 15 Final  . LDH 12/30/2015 158  98 - 192 U/L Final    Assessment:  Edgar [name] is a 25 y.o. male with stage IIB left testicular cancer s/p left radical orchiectomy on 11/26/2015.  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  Clinical/pathologic stage is T1 N1-2 S0 M0.  Pre surgery labs on 11/20/2015 revealed an AFP of 411.9, beta-HCG 2,669, and LDH 522 (121-224).  Labs on 12/19/2015 revealed an AFP of 11.2 (0-8.3), beta-HCG of 2 (0-3), and LDH 179 (121-224).  Labs on 12/23/2015 revealed an AFP of 6.3, beta-HCG of 1, and LDH 160.  Chest, abdomen, and pelvic CT scan on 11/28/2015 revealed no mediastinal adenopathy or suspicious pulmonary nodules.   There were 2 prominent left periaortic retroperitoneal lymph nodes (1.7 cm and 0.9 cm) concerning for testicular nodal metastasis.  There were no abnormal lymph nodes above the renal veins.  There were postsurgical change in the left hemiscrotum and left inguinal canal.  There was a 3.3 cm soft tissue abnormality anterior to the left iliopsoas muscle which could represent a metastatic lymph node (N2 lesion).  Head MRI on 12/17/2015 revealed no evidence of metastatic disease.    Pulmonary function testing revealed a FVC 4.71 liters (76% predicted), FEV1 4.18 liters (88% predicted), TLC 7.23 liters (87% predicted), VC 4.88 (79% predicted) and DLCO of 39.3 mL/mmHg/min (100% predicted).  He is undecided about sperm banking.  He declined evaluation for retroperitoneal lymph node dissection (RPLND).  He declined sperm banking.  Symptomatically, he denies any systemic symptoms.  Exam is unremarkable.  Plan: 1.  Labs today:  CBC with diff, CMP, Mg, LDH, AFP, beta-HCG. 2.  Discuss patient's decision about not pursuing evaluation at [facility].  He feels that having surgery does not guarantee that he will not need chemotherapy.  We previously discussed the decision making tree based on the results of a RPLND.  N0 findings would be surveillance.  N1 findings would be surveillance (preferred) or 2 cycles EP or BEP.  N2 findings would be 2 cycles BEP or EP (preferred)  or surveillance.  If patient did need chemotherapy after RPLND, he would receive fewer cycles (2 cycles rather than 3 or 4 cycles and could potentially avoid bleomycin).  Re-reviewed chemotherapy complications.  Discussed anti-emetics.  Patient consented to treatment.  Discuss my conversation with Dr. Montine Circle, colleague of Dr. Sherral Hammers.  Per Dr. Ricky Ala, robotic RPLND is a major surgery which patient should tolerate well.  He could not speak directly to complications, but again felt that he would do well.  Further conversation would be deferred to Dr. Sherral Hammers.   Discuss patient's decision not to pursue sperm banking.  Discuss his hearing test (not performed).  He would like to use his hearing test from work.  3.  Cycle #1 BEP beginning today. 4.  Rx:  Decadron, ondansetron, Ativan.  Patient can not drive or operate heavy machinery while taking Ativan.   5.  RTC in 1 week for MD assessment, labs (CBC with diff, CMP, Mg), and day 8 bleomycin.   [name], MD  12/30/2015, 10:00 AM

## 2015-12-30 ENCOUNTER — Other Ambulatory Visit: Payer: Self-pay | Admitting: Hematology and Oncology

## 2015-12-30 ENCOUNTER — Encounter (INDEPENDENT_AMBULATORY_CARE_PROVIDER_SITE_OTHER): Payer: Self-pay

## 2015-12-30 ENCOUNTER — Inpatient Hospital Stay: Payer: BLUE CROSS/BLUE SHIELD

## 2015-12-30 ENCOUNTER — Inpatient Hospital Stay (HOSPITAL_BASED_OUTPATIENT_CLINIC_OR_DEPARTMENT_OTHER): Payer: BLUE CROSS/BLUE SHIELD | Admitting: Hematology and Oncology

## 2015-12-30 ENCOUNTER — Other Ambulatory Visit: Payer: Self-pay | Admitting: *Deleted

## 2015-12-30 VITALS — BP 135/77 | HR 91 | Temp 97.0°F | Resp 18 | Wt 267.9 lb

## 2015-12-30 VITALS — BP 137/81 | HR 60 | Temp 97.2°F | Resp 18

## 2015-12-30 DIAGNOSIS — F17210 Nicotine dependence, cigarettes, uncomplicated: Secondary | ICD-10-CM | POA: Diagnosis not present

## 2015-12-30 DIAGNOSIS — Z9079 Acquired absence of other genital organ(s): Secondary | ICD-10-CM | POA: Diagnosis not present

## 2015-12-30 DIAGNOSIS — C6212 Malignant neoplasm of descended left testis: Secondary | ICD-10-CM

## 2015-12-30 DIAGNOSIS — Z800 Family history of malignant neoplasm of digestive organs: Secondary | ICD-10-CM

## 2015-12-30 DIAGNOSIS — K219 Gastro-esophageal reflux disease without esophagitis: Secondary | ICD-10-CM | POA: Diagnosis not present

## 2015-12-30 DIAGNOSIS — Z808 Family history of malignant neoplasm of other organs or systems: Secondary | ICD-10-CM

## 2015-12-30 DIAGNOSIS — Z803 Family history of malignant neoplasm of breast: Secondary | ICD-10-CM

## 2015-12-30 LAB — COMPREHENSIVE METABOLIC PANEL
ALT: 47 U/L (ref 17–63)
AST: 34 U/L (ref 15–41)
Albumin: 4.4 g/dL (ref 3.5–5.0)
Alkaline Phosphatase: 59 U/L (ref 38–126)
Anion gap: 5 (ref 5–15)
BUN: 10 mg/dL (ref 6–20)
CO2: 25 mmol/L (ref 22–32)
Calcium: 9.2 mg/dL (ref 8.9–10.3)
Chloride: 108 mmol/L (ref 101–111)
Creatinine, Ser: 0.95 mg/dL (ref 0.61–1.24)
GFR calc Af Amer: >60 mL/min (ref >60)
GFR calc non Af Amer: >60 mL/min (ref >60)
Glucose, Bld: 105 mg/dL — ABNORMAL HIGH (ref 65–99)
Potassium: 3.8 mmol/L (ref 3.5–5.1)
Sodium: 138 mmol/L (ref 135–145)
Total Bilirubin: 0.7 mg/dL (ref 0.3–1.2)
Total Protein: 7.9 g/dL (ref 6.5–8.1)

## 2015-12-30 LAB — CBC WITH DIFFERENTIAL/PLATELET
Basophils Absolute: 0 10*3/uL (ref 0–0.1)
Basophils Relative: 1 %
Eosinophils Absolute: 0.3 10*3/uL (ref 0–0.7)
Eosinophils Relative: 4 %
HCT: 44.8 % (ref 40.0–52.0)
Hemoglobin: 15.3 g/dL (ref 13.0–18.0)
Lymphocytes Relative: 20 %
Lymphs Abs: 1.4 10*3/uL (ref 1.0–3.6)
MCH: 30.3 pg (ref 26.0–34.0)
MCHC: 34.3 g/dL (ref 32.0–36.0)
MCV: 88.4 fL (ref 80.0–100.0)
Monocytes Absolute: 0.6 10*3/uL (ref 0.2–1.0)
Monocytes Relative: 9 %
Neutro Abs: 4.8 10*3/uL (ref 1.4–6.5)
Neutrophils Relative %: 66 %
Platelets: 254 10*3/uL (ref 150–440)
RBC: 5.07 MIL/uL (ref 4.40–5.90)
RDW: 13.4 % (ref 11.5–14.5)
WBC: 7.1 10*3/uL (ref 3.8–10.6)

## 2015-12-30 LAB — LACTATE DEHYDROGENASE: LDH: 158 U/L (ref 98–192)

## 2015-12-30 MED ORDER — SODIUM CHLORIDE 0.9 % IV SOLN
20.0000 mg/m2 | Freq: Once | INTRAVENOUS | Status: AC
  Administered 2015-12-30: 51 mg via INTRAVENOUS
  Filled 2015-12-30: qty 51

## 2015-12-30 MED ORDER — LORAZEPAM 0.5 MG PO TABS: 0.5000 mg | ORAL_TABLET | Freq: Four times a day (QID) | ORAL | 0 refills | Status: DC | PRN

## 2015-12-30 MED ORDER — SODIUM CHLORIDE 0.9 % IV SOLN
100.0000 mg/m2 | Freq: Once | INTRAVENOUS | Status: AC
  Administered 2015-12-30: 250 mg via INTRAVENOUS
  Filled 2015-12-30: qty 12.5

## 2015-12-30 MED ORDER — DEXAMETHASONE 4 MG PO TABS: ORAL_TABLET | ORAL | 1 refills | Status: DC

## 2015-12-30 MED ORDER — PALONOSETRON HCL INJECTION 0.25 MG/5ML
0.2500 mg | Freq: Once | INTRAVENOUS | Status: AC
  Administered 2015-12-30: 0.25 mg via INTRAVENOUS
  Filled 2015-12-30: qty 5

## 2015-12-30 MED ORDER — POTASSIUM CHLORIDE 2 MEQ/ML IV SOLN
Freq: Once | INTRAVENOUS | Status: AC
  Administered 2015-12-30: 11:00:00 via INTRAVENOUS
  Filled 2015-12-30: qty 1000

## 2015-12-30 MED ORDER — SODIUM CHLORIDE 0.9 % IV SOLN
Freq: Once | INTRAVENOUS | Status: AC
  Administered 2015-12-30: 10:00:00 via INTRAVENOUS
  Filled 2015-12-30: qty 1000

## 2015-12-30 MED ORDER — FOSAPREPITANT DIMEGLUMINE INJECTION 150 MG
Freq: Once | INTRAVENOUS | Status: AC
  Administered 2015-12-30: 12:00:00 via INTRAVENOUS
  Filled 2015-12-30: qty 5

## 2015-12-30 MED ORDER — ONDANSETRON HCL 8 MG PO TABS: 8.0000 mg | ORAL_TABLET | Freq: Two times a day (BID) | ORAL | 1 refills | Status: DC | PRN

## 2015-12-30 MED ORDER — SODIUM CHLORIDE 0.9% FLUSH
10.0000 mL | INTRAVENOUS | Status: DC | PRN
  Administered 2015-12-30: 10 mL
  Filled 2015-12-30: qty 10

## 2015-12-30 MED ORDER — SODIUM CHLORIDE 0.9 % IV SOLN
30.0000 [IU] | Freq: Once | INTRAVENOUS | Status: AC
  Administered 2015-12-30: 30 [IU] via INTRAVENOUS
  Filled 2015-12-30: qty 10

## 2015-12-30 MED ORDER — HEPARIN SOD (PORK) LOCK FLUSH 100 UNIT/ML IV SOLN
500.0000 [IU] | Freq: Once | INTRAVENOUS | Status: AC | PRN
  Administered 2015-12-30: 500 [IU]
  Filled 2015-12-30: qty 5

## 2015-12-30 NOTE — Progress Notes (Signed)
ON PATHWAY REGIMEN - Testicular  No Change  Continue With Treatment as Ordered.  Other Clinical Trial: BEP  Patient Characteristics: Nonseminoma, Newly Diagnosed, Stage IIA (Lymph Node Mass &lt; 3 cm) With Normal Serum hCG &amp; AFP AJCC N Stage: 1 AJCC M Stage: 0 Serum Tumor Markers: 1 AJCC Stage Grouping: IIA Current evidence of distant metastases? No AJCC T Stage: 1  Intent of Therapy: Curative Intent, Discussed with Patient

## 2015-12-30 NOTE — Progress Notes (Signed)
Pt reported minor reaction after several mintues of Cisplatin. Pt reports that his "throat is itching and I feel like I'm having hot flashes", VSSAF. MD notified and tx stopped, IVF initiated, pt reports that symptoms have since subsided. Tx resumed per MD recs and pt tolerating well, will continue to monitor.

## 2015-12-31 ENCOUNTER — Other Ambulatory Visit: Payer: Self-pay | Admitting: Hematology and Oncology

## 2015-12-31 ENCOUNTER — Inpatient Hospital Stay: Payer: BLUE CROSS/BLUE SHIELD

## 2015-12-31 VITALS — BP 124/80 | HR 62 | Temp 97.2°F | Resp 18

## 2015-12-31 DIAGNOSIS — C6212 Malignant neoplasm of descended left testis: Secondary | ICD-10-CM | POA: Diagnosis not present

## 2015-12-31 LAB — AFP TUMOR MARKER: AFP-Tumor Marker: 3.4 ng/mL (ref 0.0–8.3)

## 2015-12-31 LAB — BETA HCG QUANT (REF LAB): Beta hCG, Tumor Marker: <1 m[IU]/mL (ref 0–3)

## 2015-12-31 MED ORDER — SODIUM CHLORIDE 0.9 % IV SOLN
100.0000 mg/m2 | Freq: Once | INTRAVENOUS | Status: AC
  Administered 2015-12-31: 250 mg via INTRAVENOUS
  Filled 2015-12-31: qty 12.5

## 2015-12-31 MED ORDER — HEPARIN SOD (PORK) LOCK FLUSH 100 UNIT/ML IV SOLN
500.0000 [IU] | Freq: Once | INTRAVENOUS | Status: AC
  Administered 2015-12-31: 500 [IU] via INTRAVENOUS
  Filled 2015-12-31: qty 5

## 2015-12-31 MED ORDER — POTASSIUM CHLORIDE 2 MEQ/ML IV SOLN
Freq: Once | INTRAVENOUS | Status: AC
  Administered 2015-12-31: 09:00:00 via INTRAVENOUS
  Filled 2015-12-31: qty 10

## 2015-12-31 MED ORDER — SODIUM CHLORIDE 0.9 % IJ SOLN
10.0000 mL | Freq: Once | INTRAMUSCULAR | Status: AC
  Administered 2015-12-31: 10 mL via INTRAVENOUS
  Filled 2015-12-31: qty 10

## 2015-12-31 MED ORDER — SODIUM CHLORIDE 0.9 % IV SOLN: 10.0000 mg | Freq: Once | INTRAVENOUS | Status: DC

## 2015-12-31 MED ORDER — SODIUM CHLORIDE 0.9 % IV SOLN
Freq: Once | INTRAVENOUS | Status: AC
  Administered 2015-12-31: 09:00:00 via INTRAVENOUS
  Filled 2015-12-31: qty 1000

## 2015-12-31 MED ORDER — SODIUM CHLORIDE 0.9 % IV SOLN
20.0000 mg/m2 | Freq: Once | INTRAVENOUS | Status: AC
  Administered 2015-12-31: 51 mg via INTRAVENOUS
  Filled 2015-12-31: qty 51

## 2015-12-31 MED ORDER — DEXAMETHASONE SODIUM PHOSPHATE 10 MG/ML IJ SOLN
10.0000 mg | Freq: Once | INTRAMUSCULAR | Status: AC
  Administered 2015-12-31: 10 mg via INTRAVENOUS
  Filled 2015-12-31: qty 1

## 2016-01-01 ENCOUNTER — Other Ambulatory Visit: Payer: Self-pay | Admitting: Hematology and Oncology

## 2016-01-01 ENCOUNTER — Inpatient Hospital Stay: Payer: BLUE CROSS/BLUE SHIELD

## 2016-01-01 VITALS — BP 126/82 | HR 60 | Temp 97.3°F | Resp 18

## 2016-01-01 DIAGNOSIS — C6212 Malignant neoplasm of descended left testis: Secondary | ICD-10-CM | POA: Diagnosis not present

## 2016-01-01 MED ORDER — POTASSIUM CHLORIDE 2 MEQ/ML IV SOLN
Freq: Once | INTRAVENOUS | Status: AC
  Administered 2016-01-01: 09:00:00 via INTRAVENOUS
  Filled 2016-01-01: qty 1000

## 2016-01-01 MED ORDER — SODIUM CHLORIDE 0.9 % IV SOLN
Freq: Once | INTRAVENOUS | Status: AC
  Administered 2016-01-01: 09:00:00 via INTRAVENOUS
  Filled 2016-01-01: qty 1000

## 2016-01-01 MED ORDER — HEPARIN SOD (PORK) LOCK FLUSH 100 UNIT/ML IV SOLN
500.0000 [IU] | Freq: Once | INTRAVENOUS | Status: AC
  Administered 2016-01-01: 500 [IU] via INTRAVENOUS

## 2016-01-01 MED ORDER — SODIUM CHLORIDE 0.9 % IV SOLN
Freq: Once | INTRAVENOUS | Status: AC
  Administered 2016-01-01: 11:00:00 via INTRAVENOUS
  Filled 2016-01-01: qty 5

## 2016-01-01 MED ORDER — SODIUM CHLORIDE 0.9 % IJ SOLN
10.0000 mL | Freq: Once | INTRAMUSCULAR | Status: AC
  Administered 2016-01-01: 10 mL via INTRAVENOUS
  Filled 2016-01-01: qty 10

## 2016-01-01 MED ORDER — SODIUM CHLORIDE 0.9 % IV SOLN
20.0000 mg/m2 | Freq: Once | INTRAVENOUS | Status: AC
  Administered 2016-01-01: 51 mg via INTRAVENOUS
  Filled 2016-01-01: qty 51

## 2016-01-01 MED ORDER — SODIUM CHLORIDE 0.9 % IV SOLN
100.0000 mg/m2 | Freq: Once | INTRAVENOUS | Status: AC
  Administered 2016-01-01: 250 mg via INTRAVENOUS
  Filled 2016-01-01: qty 12.5

## 2016-01-01 MED ORDER — PALONOSETRON HCL INJECTION 0.25 MG/5ML
0.2500 mg | Freq: Once | INTRAVENOUS | Status: AC
  Administered 2016-01-01: 0.25 mg via INTRAVENOUS
  Filled 2016-01-01: qty 5

## 2016-01-02 ENCOUNTER — Other Ambulatory Visit: Payer: Self-pay | Admitting: Hematology and Oncology

## 2016-01-02 ENCOUNTER — Inpatient Hospital Stay: Payer: BLUE CROSS/BLUE SHIELD

## 2016-01-02 VITALS — BP 130/80 | HR 60 | Temp 98.1°F | Resp 18

## 2016-01-02 DIAGNOSIS — C6212 Malignant neoplasm of descended left testis: Secondary | ICD-10-CM

## 2016-01-02 MED ORDER — POTASSIUM CHLORIDE 2 MEQ/ML IV SOLN
Freq: Once | INTRAVENOUS | Status: AC
  Administered 2016-01-02: 09:00:00 via INTRAVENOUS
  Filled 2016-01-02: qty 10

## 2016-01-02 MED ORDER — SODIUM CHLORIDE 0.9 % IV SOLN
100.0000 mg/m2 | Freq: Once | INTRAVENOUS | Status: AC
  Administered 2016-01-02: 250 mg via INTRAVENOUS
  Filled 2016-01-02: qty 12.5

## 2016-01-02 MED ORDER — SODIUM CHLORIDE 0.9 % IJ SOLN
10.0000 mL | Freq: Once | INTRAMUSCULAR | Status: AC
  Administered 2016-01-02: 10 mL via INTRAVENOUS
  Filled 2016-01-02: qty 10

## 2016-01-02 MED ORDER — SODIUM CHLORIDE 0.9 % IV SOLN
20.0000 mg/m2 | Freq: Once | INTRAVENOUS | Status: AC
  Administered 2016-01-02: 51 mg via INTRAVENOUS
  Filled 2016-01-02: qty 51

## 2016-01-02 MED ORDER — SODIUM CHLORIDE 0.9 % IV SOLN: 10.0000 mg | Freq: Once | INTRAVENOUS | Status: DC

## 2016-01-02 MED ORDER — DEXAMETHASONE SODIUM PHOSPHATE 10 MG/ML IJ SOLN
10.0000 mg | Freq: Once | INTRAMUSCULAR | Status: AC
  Administered 2016-01-02: 10 mg via INTRAVENOUS
  Filled 2016-01-02: qty 1

## 2016-01-02 MED ORDER — HEPARIN SOD (PORK) LOCK FLUSH 100 UNIT/ML IV SOLN
500.0000 [IU] | Freq: Once | INTRAVENOUS | Status: AC
  Administered 2016-01-02: 500 [IU] via INTRAVENOUS
  Filled 2016-01-02: qty 5

## 2016-01-02 MED ORDER — SODIUM CHLORIDE 0.9 % IV SOLN
Freq: Once | INTRAVENOUS | Status: AC
  Administered 2016-01-02: 09:00:00 via INTRAVENOUS
  Filled 2016-01-02: qty 1000

## 2016-01-03 ENCOUNTER — Inpatient Hospital Stay: Payer: BLUE CROSS/BLUE SHIELD

## 2016-01-03 VITALS — BP 129/75 | HR 75 | Temp 97.5°F | Resp 18

## 2016-01-03 DIAGNOSIS — C6212 Malignant neoplasm of descended left testis: Secondary | ICD-10-CM | POA: Diagnosis not present

## 2016-01-03 MED ORDER — SODIUM CHLORIDE 0.9 % IJ SOLN
10.0000 mL | Freq: Once | INTRAMUSCULAR | Status: AC
  Administered 2016-01-03: 10 mL via INTRAVENOUS
  Filled 2016-01-03: qty 10

## 2016-01-03 MED ORDER — SODIUM CHLORIDE 0.9 % IV SOLN
Freq: Once | INTRAVENOUS | Status: AC
  Administered 2016-01-03: 11:00:00 via INTRAVENOUS
  Filled 2016-01-03: qty 5

## 2016-01-03 MED ORDER — POTASSIUM CHLORIDE 2 MEQ/ML IV SOLN
Freq: Once | INTRAVENOUS | Status: AC
  Administered 2016-01-03: 09:00:00 via INTRAVENOUS
  Filled 2016-01-03: qty 10

## 2016-01-03 MED ORDER — CISPLATIN CHEMO INJECTION 100MG/100ML
20.0000 mg/m2 | Freq: Once | INTRAVENOUS | Status: AC
  Administered 2016-01-03: 51 mg via INTRAVENOUS
  Filled 2016-01-03: qty 51

## 2016-01-03 MED ORDER — SODIUM CHLORIDE 0.9 % IV SOLN
100.0000 mg/m2 | Freq: Once | INTRAVENOUS | Status: AC
  Administered 2016-01-03: 250 mg via INTRAVENOUS
  Filled 2016-01-03: qty 12.5

## 2016-01-03 MED ORDER — SODIUM CHLORIDE 0.9 % IV SOLN
Freq: Once | INTRAVENOUS | Status: AC
  Administered 2016-01-03: 09:00:00 via INTRAVENOUS
  Filled 2016-01-03: qty 1000

## 2016-01-03 MED ORDER — SODIUM CHLORIDE 0.9 % IV SOLN: 100.0000 mg/m2 | Freq: Once | INTRAVENOUS | Status: DC

## 2016-01-03 MED ORDER — HEPARIN SOD (PORK) LOCK FLUSH 100 UNIT/ML IV SOLN
500.0000 [IU] | Freq: Once | INTRAVENOUS | Status: AC
  Administered 2016-01-03: 500 [IU] via INTRAVENOUS
  Filled 2016-01-03: qty 5

## 2016-01-03 MED ORDER — PALONOSETRON HCL INJECTION 0.25 MG/5ML
0.2500 mg | Freq: Once | INTRAVENOUS | Status: AC
  Administered 2016-01-03: 0.25 mg via INTRAVENOUS
  Filled 2016-01-03: qty 5

## 2016-01-04 ENCOUNTER — Encounter: Payer: Self-pay | Admitting: Hematology and Oncology

## 2016-01-05 NOTE — Progress Notes (Signed)
[place] [facility] day:  01/06/2016  Chief Complaint: Edgar [name] is a 25 y.o. male with stage IIB testicular cancer who is seen for assessment prior to day 8 of cycle #1 BEP.  HPI: The patient was last seen in the medical oncology clinic on 12/30/2015.  At that time, he began cycle #1 BEP.  He received chemotherapy for 5 days.  On day 1, he experienced throat itching and some flushing with cisplatin.  Symptoms resolved quickly after pausing his infusion.  He tolerated the rest of his  chemotherapy well.  Symptomatically, he feels a little lightheaded today on changing position.  He is drinking his fluids.  He has some mild nausea, but no vomiting.  He is taking Ativan prn..  He notes soft stools starting yesterday.  He denies any abdominal pain.  He denies any melena or hematochezia.     Past Medical History:  Diagnosis Date  . Asthma    AS A CHILD-NO INHALERS  . GERD (gastroesophageal reflux disease)    TUMS PRN    Past Surgical History:  Procedure Laterality Date  . ANKLE FRACTURE SURGERY Right 2004  . ORCHIECTOMY Left 11/26/2015   Procedure: ORCHIECTOMY;  Surgeon: [name], MD;  Location: [facility] ORS;  Service: Urology;  Laterality: Left;  radical/Inguinal approach  . PERIPHERAL VASCULAR CATHETERIZATION N/A 12/16/2015   Procedure: [name] Cath Insertion;  Surgeon: [name], MD;  Location: [facility];  Service: Cardiovascular;  Laterality: N/A;  . WISDOM TOOTH EXTRACTION  2017    Family History  Problem Relation Age of Onset  . Skin cancer Mother   . Breast cancer Maternal Grandmother   . Colon cancer Maternal Grandfather   . Diabetes Maternal Grandfather   . Prostate cancer Neg Hx   . Kidney cancer Neg Hx   . Bladder Cancer Neg Hx     Social History:  reports that he has been smoking Cigarettes.  He has a 4.00 pack-year smoking history. He has quit using smokeless tobacco. His smokeless tobacco use included Snuff. He  reports that he uses drugs, including Marijuana. He reports that he does not drink alcohol.  He smokes 1/2 pack a day.  He smokes marijuana.  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  He has a 15 year-old-daughter named Edgar [name]. The patient's mother's cell phone number is [phone]. The patient is accompanied by his mother today.  Allergies: No Known Allergies  Current Medications: Current Outpatient Prescriptions  Medication Sig Dispense Refill  . acetaminophen (TYLENOL) 325 MG tablet Take 650 mg by mouth every 6 (six) hours as needed.    Marland Kitchen dexamethasone (DECADRON) 4 MG tablet Take 2 tablets by mouth once a day on the day starting on day 6 of chemotherapy and then take 2 tablets two times a day for 2 days. Take with food. 30 tablet 1  . docusate sodium (COLACE) 100 MG capsule Take 100 mg by mouth 2 (two) times daily.  0  . lidocaine-prilocaine (EMLA) cream Apply small amount of cream over the portacath site 1 hour before chemotherapy treatment, place saran wrap over the cream to protect your clothing 30 g 1  . LORazepam (ATIVAN) 0.5 MG tablet Take 1 tablet (0.5 mg total) by mouth every 6 (six) hours as needed (Nausea or vomiting). 30 tablet 0  . ondansetron (ZOFRAN) 8 MG tablet Take 1 tablet (8 mg total) by mouth 2 (two) times  daily as needed. Start on day 8 of chemotherapy. 30 tablet 1   No current facility-administered medications for this visit.     Review of Systems:  GENERAL:  Feels "ok".  No fevers or sweats.  Weight loss of 1 pound. PERFORMANCE STATUS (ECOG):  0 HEENT:  No visual changes, runny nose, sore throat, mouth sores or tenderness. Lungs: No shortness of breath or cough.  No hemoptysis. Cardiac:  No chest pain, palpitations, orthopnea, or PND. GI:  Soft stools.  No nausea, vomiting, diarrhea, constipation, melena or hematochezia. GU:  No urgency, frequency, dysuria, or hematuria. Musculoskeletal:  No back pain.  No joint pain.  No muscle  tenderness. Extremities:  No pain or swelling. Skin:  No rashes or skin changes. Neuro:  Little light headed.  No headache, numbness or weakness, balance or coordination issues. Endocrine:  No diabetes, thyroid issues, hot flashes or night sweats. Psych:  No mood changes, depression or anxiety. Pain:  No pain. Review of systems:  All other systems reviewed and found to be negative.  Physical Exam: Blood pressure 138/87, pulse 79, temperature (!) 96.7 F (35.9 C), temperature source Tympanic, resp. rate 18, weight 266 lb 12.1 oz (121 kg). GENERAL:  Well developed, well nourished, gentleman sitting comfortably in the exam room in no acute distress. MENTAL STATUS:  Alert and oriented to person, place and time. HEAD:  Wearing a cap.  Brown hair and full beard.  Normocephalic, atraumatic, face symmetric, no Cushingoid features. EYES:  Pupils equal round and reactive to light and accomodation.  No conjunctivitis or scleral icterus. ENT:  Oropharynx clear without lesion.  Tongue normal. Mucous membranes moist.  RESPIRATORY:  Clear to auscultation without rales, wheezes or rhonchi. CARDIOVASCULAR:  Regular rate and rhythm without murmur, rub or gallop. ABDOMEN:  Soft, non-tender, with active bowel sounds, and no hepatosplenomegaly.  No masses. SKIN:  No rashes, ulcers or lesions. EXTREMITIES: No edema, no skin discoloration or tenderness.  No palpable cords. LYMPH NODES: No palpable cervical, supraclavicular, axillary or inguinal adenopathy  NEUROLOGICAL: Unremarkable. PSYCH:  Appropriate.   Appointment on 01/06/2016  Component Date Value Ref Range Status  . WBC 01/06/2016 7.3  3.8 - 10.6 K/uL Final  . RBC 01/06/2016 5.11  4.40 - 5.90 MIL/uL Final  . Hemoglobin 01/06/2016 15.3  13.0 - 18.0 g/dL Final  . HCT 01/06/2016 44.9  40.0 - 52.0 % Final  . MCV 01/06/2016 87.9  80.0 - 100.0 fL Final  . MCH 01/06/2016 29.9  26.0 - 34.0 pg Final  . MCHC 01/06/2016 34.0  32.0 - 36.0 g/dL Final  . RDW  01/06/2016 13.1  11.5 - 14.5 % Final  . Platelets 01/06/2016 261  150 - 440 K/uL Final  . Neutrophils Relative % 01/06/2016 77  % Final  . Neutro Abs 01/06/2016 5.5  1.4 - 6.5 K/uL Final  . Lymphocytes Relative 01/06/2016 21  % Final  . Lymphs Abs 01/06/2016 1.6  1.0 - 3.6 K/uL Final  . Monocytes Relative 01/06/2016 0  % Final  . Monocytes Absolute 01/06/2016 0.0* 0.2 - 1.0 K/uL Final  . Eosinophils Relative 01/06/2016 2  % Final  . Eosinophils Absolute 01/06/2016 0.2  0 - 0.7 K/uL Final  . Basophils Relative 01/06/2016 0  % Final  . Basophils Absolute 01/06/2016 0.0  0 - 0.1 K/uL Final  . Sodium 01/06/2016 132* 135 - 145 mmol/L Final  . Potassium 01/06/2016 4.0  3.5 - 5.1 mmol/L Final  . Chloride 01/06/2016 98* 101 - 111  mmol/L Final  . CO2 01/06/2016 25  22 - 32 mmol/L Final  . Glucose, Bld 01/06/2016 108* 65 - 99 mg/dL Final  . BUN 01/06/2016 19  6 - 20 mg/dL Final  . Creatinine, Ser 01/06/2016 0.68  0.61 - 1.24 mg/dL Final  . Calcium 01/06/2016 9.5  8.9 - 10.3 mg/dL Final  . GFR calc non Af Amer 01/06/2016 >60  >60 mL/min Final  . GFR calc Af Amer 01/06/2016 >60  >60 mL/min Final   Comment: (NOTE) The eGFR has been calculated using the CKD EPI equation. This calculation has not been validated in all clinical situations. eGFR's persistently <60 mL/min signify possible Chronic Kidney Disease.   . Anion gap 01/06/2016 9  5 - 15 Final  . Magnesium 01/06/2016 2.0  1.7 - 2.4 mg/dL Final    Assessment:  ALMALIK WEISSBERG is a 26 y.o. male with stage IIB left testicular cancer s/p left radical orchiectomy on 11/26/2015.  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  Clinical/pathologic stage is T1 N1-2 S0 M0.  Pre surgery labs on 11/20/2015 revealed an AFP of 411.9, beta-HCG 2,669, and LDH 522 (121-224).  Labs on 12/19/2015 revealed an AFP of 11.2 (0-8.3), beta-HCG of 2  (0-3), and LDH 179 (121-224).  Labs on 12/23/2015 revealed an AFP of 6.3, beta-HCG of 1, and LDH 160.  Chest, abdomen, and pelvic CT scan on 11/28/2015 revealed no mediastinal adenopathy or suspicious pulmonary nodules.   There were 2 prominent left periaortic retroperitoneal lymph nodes (1.7 cm and 0.9 cm) concerning for testicular nodal metastasis.  There were no abnormal lymph nodes above the renal veins.  There were postsurgical change in the left hemiscrotum and left inguinal canal.  There was a 3.3 cm soft tissue abnormality anterior to the left iliopsoas muscle which could represent a metastatic lymph node (N2 lesion).  Head MRI on 12/17/2015 revealed no evidence of metastatic disease.    Pulmonary function testing revealed a FVC 4.71 liters (76% predicted), FEV1 4.18 liters (88% predicted), TLC 7.23 liters (87% predicted), VC 4.88 (79% predicted) and DLCO of 39.3 mL/mmHg/min (100% predicted).  He declined sperm banking.  He declined evaluation for retroperitoneal lymph node dissection (RPLND).  He declined sperm banking.  He is currently day 8 of cycle #1 BEP (12/30/2015).  He experienced some throat itching and flushing with day 1 cisplatin.  Symptomatically, he feels a little lightheaded.  He has had some soft stools.  Exam is unremarkable.  Plan: 1.  Labs today:  CBC with diff, CMP, Mg. 2.  Day 8 bleomycin. 3.  Check orthostatics. 4.  RTC in 1 week for MD assess, labs (CBC with diff, CMP, Mg) and bleomycin   [name], MD  01/06/2016, 9:34 AM

## 2016-01-06 ENCOUNTER — Encounter: Payer: Self-pay | Admitting: Hematology and Oncology

## 2016-01-06 ENCOUNTER — Other Ambulatory Visit: Payer: Self-pay | Admitting: *Deleted

## 2016-01-06 ENCOUNTER — Telehealth: Payer: Self-pay | Admitting: *Deleted

## 2016-01-06 ENCOUNTER — Inpatient Hospital Stay: Payer: BLUE CROSS/BLUE SHIELD

## 2016-01-06 ENCOUNTER — Encounter (INDEPENDENT_AMBULATORY_CARE_PROVIDER_SITE_OTHER): Payer: Self-pay

## 2016-01-06 ENCOUNTER — Inpatient Hospital Stay (HOSPITAL_BASED_OUTPATIENT_CLINIC_OR_DEPARTMENT_OTHER): Payer: BLUE CROSS/BLUE SHIELD | Admitting: Hematology and Oncology

## 2016-01-06 VITALS — BP 132/88 | HR 77

## 2016-01-06 VITALS — BP 137/91 | HR 86 | Temp 96.7°F | Resp 18 | Wt 266.8 lb

## 2016-01-06 DIAGNOSIS — R110 Nausea: Secondary | ICD-10-CM

## 2016-01-06 DIAGNOSIS — Z800 Family history of malignant neoplasm of digestive organs: Secondary | ICD-10-CM

## 2016-01-06 DIAGNOSIS — Z808 Family history of malignant neoplasm of other organs or systems: Secondary | ICD-10-CM

## 2016-01-06 DIAGNOSIS — R42 Dizziness and giddiness: Secondary | ICD-10-CM

## 2016-01-06 DIAGNOSIS — C6212 Malignant neoplasm of descended left testis: Secondary | ICD-10-CM

## 2016-01-06 DIAGNOSIS — F17210 Nicotine dependence, cigarettes, uncomplicated: Secondary | ICD-10-CM

## 2016-01-06 DIAGNOSIS — Z5111 Encounter for antineoplastic chemotherapy: Secondary | ICD-10-CM

## 2016-01-06 DIAGNOSIS — Z9079 Acquired absence of other genital organ(s): Secondary | ICD-10-CM

## 2016-01-06 DIAGNOSIS — K219 Gastro-esophageal reflux disease without esophagitis: Secondary | ICD-10-CM | POA: Diagnosis not present

## 2016-01-06 DIAGNOSIS — Z803 Family history of malignant neoplasm of breast: Secondary | ICD-10-CM

## 2016-01-06 LAB — BASIC METABOLIC PANEL
Anion gap: 9 (ref 5–15)
BUN: 19 mg/dL (ref 6–20)
CO2: 25 mmol/L (ref 22–32)
Calcium: 9.5 mg/dL (ref 8.9–10.3)
Chloride: 98 mmol/L — ABNORMAL LOW (ref 101–111)
Creatinine, Ser: 0.68 mg/dL (ref 0.61–1.24)
GFR calc Af Amer: >60 mL/min (ref >60)
GFR calc non Af Amer: >60 mL/min (ref >60)
Glucose, Bld: 108 mg/dL — ABNORMAL HIGH (ref 65–99)
Potassium: 4 mmol/L (ref 3.5–5.1)
Sodium: 132 mmol/L — ABNORMAL LOW (ref 135–145)

## 2016-01-06 LAB — CBC WITH DIFFERENTIAL/PLATELET
Basophils Absolute: 0 10*3/uL (ref 0–0.1)
Basophils Relative: 0 %
Eosinophils Absolute: 0.2 10*3/uL (ref 0–0.7)
Eosinophils Relative: 2 %
HCT: 44.9 % (ref 40.0–52.0)
Hemoglobin: 15.3 g/dL (ref 13.0–18.0)
Lymphocytes Relative: 21 %
Lymphs Abs: 1.6 10*3/uL (ref 1.0–3.6)
MCH: 29.9 pg (ref 26.0–34.0)
MCHC: 34 g/dL (ref 32.0–36.0)
MCV: 87.9 fL (ref 80.0–100.0)
Monocytes Absolute: 0 10*3/uL — ABNORMAL LOW (ref 0.2–1.0)
Monocytes Relative: 0 %
Neutro Abs: 5.5 10*3/uL (ref 1.4–6.5)
Neutrophils Relative %: 77 %
Platelets: 261 10*3/uL (ref 150–440)
RBC: 5.11 MIL/uL (ref 4.40–5.90)
RDW: 13.1 % (ref 11.5–14.5)
WBC: 7.3 10*3/uL (ref 3.8–10.6)

## 2016-01-06 LAB — MAGNESIUM: Magnesium: 2 mg/dL (ref 1.7–2.4)

## 2016-01-06 MED ORDER — SODIUM CHLORIDE 0.9% FLUSH
10.0000 mL | INTRAVENOUS | Status: DC | PRN
  Filled 2016-01-06: qty 10

## 2016-01-06 MED ORDER — ONDANSETRON 8 MG PO TBDP
8.0000 mg | ORAL_TABLET | Freq: Once | ORAL | Status: AC
  Administered 2016-01-06: 8 mg via ORAL
  Filled 2016-01-06: qty 1

## 2016-01-06 MED ORDER — SODIUM CHLORIDE 0.9 % IV SOLN
30.0000 [IU] | Freq: Once | INTRAVENOUS | Status: AC
  Administered 2016-01-06: 30 [IU] via INTRAVENOUS
  Filled 2016-01-06: qty 10

## 2016-01-06 MED ORDER — HEPARIN SOD (PORK) LOCK FLUSH 100 UNIT/ML IV SOLN
500.0000 [IU] | Freq: Once | INTRAVENOUS | Status: AC | PRN
  Administered 2016-01-06: 500 [IU]
  Filled 2016-01-06: qty 5

## 2016-01-06 MED ORDER — SODIUM CHLORIDE 0.9 % IV SOLN
Freq: Once | INTRAVENOUS | Status: AC
  Administered 2016-01-06: 10:00:00 via INTRAVENOUS
  Filled 2016-01-06: qty 1000

## 2016-01-06 MED ORDER — ONDANSETRON HCL 40 MG/20ML IJ SOLN: Freq: Once | INTRAMUSCULAR | Status: DC

## 2016-01-06 NOTE — Progress Notes (Signed)
Patient states he started having loose stools yesterday.  Also had some dizziness and exertional SOB.  Appetite good. Has experienced some nausea but his medication helped.  States he got up with a runny nose this morning.  States he got hot yesterday and turned the [facility] down. Thinks that is why his nose his running.

## 2016-01-06 NOTE — Progress Notes (Unsigned)
PSN met with patient and his mother.  PSN gave patient information about [place]'s financial assistance programs.  Patient currently not working due to illness.  Patient lives with girlfriend and daughter.  Patient stated that girlfriend's income is not enough to pay all of their monthly expenses.  The [employer] fund will assist patient with elec bill, groceries and gas to and from treatment.  The [employer] will assist with rent if landlord is willing to complete W-9 tax form.  PSN gave patient several gas voucher and called in a larger amount for food to [employer] since PSN will be out of the office the majority of Nov. 2017.  Patient will also contact cancer center if a co-pay on a medication prescribed by his oncologist is unaffordable.

## 2016-01-06 NOTE — Progress Notes (Signed)
I have called infusion and spoke to vicky who will monitor his b/p.

## 2016-01-06 NOTE — Telephone Encounter (Signed)
Called and spoke to pt. Dr. Mike Gip just wanted to make sure pt was educated on sexual intercourse precautions during chemotherapy and each time he has been with his Mom and she did not feel that it was appropriate conversation with Mom in room.  Each time today we have tried to talk with him, Mom has been with him.  I tried calling his cell phone but he left it at home with girlfriend.  I did call later when he got home and he came to the phone and I explained what the conversation was about and the fact we felt like he would not want to talk about this matter in front of his mom. He did feel this way. He states that he was taught this in his chemo class and understands to use condoms for 48 hours after treatment of any of the chemo drugs he has rcvd.  He also asked about his paper work and it was faxed in and it went through. I also reminded him of taking it easy when he is moving positions from lying, sitting, standing and try not to go so fast because he has experienced dizzines on changing positions but we did orthostatics today and it did not show a change in pressure and Heart rate with the position changes

## 2016-01-13 ENCOUNTER — Inpatient Hospital Stay: Payer: BLUE CROSS/BLUE SHIELD

## 2016-01-13 ENCOUNTER — Encounter: Payer: Self-pay | Admitting: Hematology and Oncology

## 2016-01-13 ENCOUNTER — Inpatient Hospital Stay (HOSPITAL_BASED_OUTPATIENT_CLINIC_OR_DEPARTMENT_OTHER): Payer: BLUE CROSS/BLUE SHIELD | Admitting: Hematology and Oncology

## 2016-01-13 ENCOUNTER — Other Ambulatory Visit: Payer: Self-pay | Admitting: *Deleted

## 2016-01-13 VITALS — BP 122/79 | HR 84

## 2016-01-13 VITALS — BP 160/88 | HR 85 | Temp 98.9°F | Resp 18 | Wt 280.9 lb

## 2016-01-13 DIAGNOSIS — C6212 Malignant neoplasm of descended left testis: Secondary | ICD-10-CM

## 2016-01-13 DIAGNOSIS — F17210 Nicotine dependence, cigarettes, uncomplicated: Secondary | ICD-10-CM

## 2016-01-13 DIAGNOSIS — Z808 Family history of malignant neoplasm of other organs or systems: Secondary | ICD-10-CM

## 2016-01-13 DIAGNOSIS — Z803 Family history of malignant neoplasm of breast: Secondary | ICD-10-CM

## 2016-01-13 DIAGNOSIS — Z9079 Acquired absence of other genital organ(s): Secondary | ICD-10-CM

## 2016-01-13 DIAGNOSIS — D701 Agranulocytosis secondary to cancer chemotherapy: Secondary | ICD-10-CM

## 2016-01-13 DIAGNOSIS — R110 Nausea: Secondary | ICD-10-CM | POA: Diagnosis not present

## 2016-01-13 DIAGNOSIS — T451X5A Adverse effect of antineoplastic and immunosuppressive drugs, initial encounter: Secondary | ICD-10-CM

## 2016-01-13 DIAGNOSIS — Z800 Family history of malignant neoplasm of digestive organs: Secondary | ICD-10-CM

## 2016-01-13 DIAGNOSIS — Z5111 Encounter for antineoplastic chemotherapy: Secondary | ICD-10-CM

## 2016-01-13 DIAGNOSIS — K219 Gastro-esophageal reflux disease without esophagitis: Secondary | ICD-10-CM

## 2016-01-13 DIAGNOSIS — R42 Dizziness and giddiness: Secondary | ICD-10-CM

## 2016-01-13 LAB — CBC WITH DIFFERENTIAL/PLATELET
Basophils Absolute: 0 10*3/uL (ref 0–0.1)
Basophils Relative: 2 %
Eosinophils Absolute: 0 10*3/uL (ref 0–0.7)
Eosinophils Relative: 1 %
HCT: 35.8 % — ABNORMAL LOW (ref 40.0–52.0)
Hemoglobin: 12 g/dL — ABNORMAL LOW (ref 13.0–18.0)
Lymphocytes Relative: 37 %
Lymphs Abs: 0.7 10*3/uL — ABNORMAL LOW (ref 1.0–3.6)
MCH: 29.5 pg (ref 26.0–34.0)
MCHC: 33.6 g/dL (ref 32.0–36.0)
MCV: 87.9 fL (ref 80.0–100.0)
Monocytes Absolute: 0.1 10*3/uL — ABNORMAL LOW (ref 0.2–1.0)
Monocytes Relative: 7 %
Neutro Abs: 1 10*3/uL — ABNORMAL LOW (ref 1.4–6.5)
Neutrophils Relative %: 53 %
Platelets: 137 10*3/uL — ABNORMAL LOW (ref 150–440)
RBC: 4.07 MIL/uL — ABNORMAL LOW (ref 4.40–5.90)
RDW: 13 % (ref 11.5–14.5)
WBC: 1.9 10*3/uL — ABNORMAL LOW (ref 3.8–10.6)

## 2016-01-13 LAB — COMPREHENSIVE METABOLIC PANEL
ALT: 26 U/L (ref 17–63)
AST: 19 U/L (ref 15–41)
Albumin: 3.8 g/dL (ref 3.5–5.0)
Alkaline Phosphatase: 47 U/L (ref 38–126)
Anion gap: 9 (ref 5–15)
BUN: 16 mg/dL (ref 6–20)
CO2: 23 mmol/L (ref 22–32)
Calcium: 8.1 mg/dL — ABNORMAL LOW (ref 8.9–10.3)
Chloride: 104 mmol/L (ref 101–111)
Creatinine, Ser: 0.8 mg/dL (ref 0.61–1.24)
GFR calc Af Amer: >60 mL/min (ref >60)
GFR calc non Af Amer: >60 mL/min (ref >60)
Glucose, Bld: 119 mg/dL — ABNORMAL HIGH (ref 65–99)
Potassium: 4.1 mmol/L (ref 3.5–5.1)
Sodium: 136 mmol/L (ref 135–145)
Total Bilirubin: 0.4 mg/dL (ref 0.3–1.2)
Total Protein: 6.3 g/dL — ABNORMAL LOW (ref 6.5–8.1)

## 2016-01-13 LAB — MAGNESIUM: Magnesium: 2 mg/dL (ref 1.7–2.4)

## 2016-01-13 MED ORDER — ONDANSETRON 8 MG PO TBDP
8.0000 mg | ORAL_TABLET | Freq: Once | ORAL | Status: AC
  Administered 2016-01-13: 8 mg via ORAL
  Filled 2016-01-13: qty 1

## 2016-01-13 MED ORDER — HEPARIN SOD (PORK) LOCK FLUSH 100 UNIT/ML IV SOLN
500.0000 [IU] | Freq: Once | INTRAVENOUS | Status: AC | PRN
  Administered 2016-01-13: 500 [IU]
  Filled 2016-01-13: qty 5

## 2016-01-13 MED ORDER — ONDANSETRON HCL 40 MG/20ML IJ SOLN: Freq: Once | INTRAMUSCULAR | Status: DC

## 2016-01-13 MED ORDER — SODIUM CHLORIDE 0.9 % IV SOLN
Freq: Once | INTRAVENOUS | Status: AC
  Administered 2016-01-13: 11:00:00 via INTRAVENOUS
  Filled 2016-01-13: qty 1000

## 2016-01-13 MED ORDER — LEVOFLOXACIN 500 MG PO TABS: 500.0000 mg | ORAL_TABLET | Freq: Every day | ORAL | 0 refills | Status: AC

## 2016-01-13 MED ORDER — SODIUM CHLORIDE 0.9 % IV SOLN
30.0000 [IU] | Freq: Once | INTRAVENOUS | Status: AC
  Administered 2016-01-13: 30 [IU] via INTRAVENOUS
  Filled 2016-01-13: qty 10

## 2016-01-13 MED ORDER — ONDANSETRON 8 MG PO TBDP: 8.0000 mg | ORAL_TABLET | Freq: Once | ORAL | Status: AC

## 2016-01-13 NOTE — Progress Notes (Signed)
Patient states he is sleeping better.  Has not been dizzy.  Drinking more water.  Appetite better.

## 2016-01-13 NOTE — Progress Notes (Signed)
[facility] day:  01/13/2016  Chief Complaint: Edgar [name] is a 25 y.o. male with stage IIB testicular cancer who is seen for assessment prior to day 15 of cycle #1 BEP.  HPI: The patient was last seen in the medical oncology clinic on 01/06/2016.  At that time, he received day 8 bleomycin of cycle #1 BEP.  He felt a little lightheaded.  Orthostatic were normal.    During the interim, he has done well.  He states that he took it easy .  He has stayed busy.   He is eating well.  He denies any nausea or vomiting.     Past Medical History:  Diagnosis Date  . Asthma    AS A CHILD-NO INHALERS  . GERD (gastroesophageal reflux disease)    TUMS PRN    Past Surgical History:  Procedure Laterality Date  . ANKLE FRACTURE SURGERY Right 2004  . ORCHIECTOMY Left 11/26/2015   Procedure: ORCHIECTOMY;  Surgeon: [name], MD;  Location: [facility];  Service: Urology;  Laterality: Left;  radical/Inguinal approach  . PERIPHERAL VASCULAR CATHETERIZATION N/A 12/16/2015   Procedure: [name] Cath Insertion;  Surgeon: [name], MD;  Location: [facility];  Service: Cardiovascular;  Laterality: N/A;  . WISDOM TOOTH EXTRACTION  2017    Family History  Problem Relation Age of Onset  . Skin cancer Mother   . Breast cancer Maternal Grandmother   . Colon cancer Maternal Grandfather   . Diabetes Maternal Grandfather   . Prostate cancer Neg Hx   . Kidney cancer Neg Hx   . Bladder Cancer Neg Hx     Social History:  reports that he has been smoking Cigarettes.  He has a 4.00 pack-year smoking history. He has quit using smokeless tobacco. His smokeless tobacco use included Snuff. He reports that he uses drugs, including Marijuana. He reports that he does not drink alcohol.  He smokes 1/2 pack a day.  He smokes marijuana.  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  He has a 23 year-old-daughter named [name]. The  patient's mother's cell phone number is [phone]. The patient is accompanied by his mother today.  Allergies: No Known Allergies  Current Medications: Current Outpatient Prescriptions  Medication Sig Dispense Refill  . acetaminophen (TYLENOL) 325 MG tablet Take 650 mg by mouth every 6 (six) hours as needed.    Marland Kitchen dexamethasone (DECADRON) 4 MG tablet Take 2 tablets by mouth once a day on the day starting on day 6 of chemotherapy and then take 2 tablets two times a day for 2 days. Take with food. 30 tablet 1  . docusate sodium (COLACE) 100 MG capsule Take 100 mg by mouth 2 (two) times daily.  0  . lidocaine-prilocaine (EMLA) cream Apply small amount of cream over the portacath site 1 hour before chemotherapy treatment, place saran wrap over the cream to protect your clothing 30 g 1  . LORazepam (ATIVAN) 0.5 MG tablet Take 1 tablet (0.5 mg total) by mouth every 6 (six) hours as needed (Nausea or vomiting). 30 tablet 0  . ondansetron (ZOFRAN) 8 MG tablet Take 1 tablet (8 mg total) by mouth 2 (two) times daily as needed. Start on day 8 of chemotherapy. 30 tablet 1   No current facility-administered medications for this visit.     Review of Systems:  GENERAL:  Feels better.  No fevers or sweats.  Weight up 14 pounds. PERFORMANCE STATUS (ECOG):  0 HEENT:  No visual changes, runny nose, sore throat, mouth sores or tenderness. Lungs: Shortness of breath on exertion.  No cough.  No hemoptysis. Cardiac:  No chest pain, palpitations, orthopnea, or PND. GI:  No nausea, vomiting, diarrhea, constipation, melena or hematochezia. GU:  No urgency, frequency, dysuria, or hematuria. Musculoskeletal:  No back pain.  No joint pain.  No muscle tenderness. Extremities:  No pain or swelling. Skin:  No rashes or skin changes. Neuro:  No headache, numbness or weakness, balance or coordination issues. Endocrine:  No diabetes, thyroid issues, hot flashes or night sweats. Psych:  No mood changes, depression or  anxiety. Pain:  No pain. Review of systems:  All other systems reviewed and found to be negative.  Physical Exam: 266 Blood pressure (!) 142/97, pulse (!) 103, temperature 98.9 F (37.2 C), temperature source Tympanic, resp. rate 18, weight 280 lb 13.9 oz (127.4 kg). GENERAL:  Well developed, well nourished, gentleman sitting comfortably in the exam room in no acute distress. MENTAL STATUS:  Alert and oriented to person, place and time. HEAD:  Brown hair and full beard.  Normocephalic, atraumatic, face symmetric, no Cushingoid features. EYES:  Pupils equal round and reactive to light and accomodation.  No conjunctivitis or scleral icterus. ENT:  Oropharynx clear without lesion.  Tongue normal. Mucous membranes moist.  RESPIRATORY:  Clear to auscultation without rales, wheezes or rhonchi. CARDIOVASCULAR:  Regular rate and rhythm without murmur, rub or gallop. ABDOMEN:  Soft, non-tender, with active bowel sounds, and no hepatosplenomegaly.  No masses. SKIN:  No rashes, ulcers or lesions. EXTREMITIES: No edema, no skin discoloration or tenderness.  No palpable cords. LYMPH NODES: No palpable cervical, supraclavicular, axillary or inguinal adenopathy  NEUROLOGICAL: Unremarkable. PSYCH:  Appropriate.   Infusion on 01/13/2016  Component Date Value Ref Range Status  . WBC 01/13/2016 1.9* 3.8 - 10.6 K/uL Final  . RBC 01/13/2016 4.07* 4.40 - 5.90 MIL/uL Final  . Hemoglobin 01/13/2016 12.0* 13.0 - 18.0 g/dL Final  . HCT 01/13/2016 35.8* 40.0 - 52.0 % Final  . MCV 01/13/2016 87.9  80.0 - 100.0 fL Final  . MCH 01/13/2016 29.5  26.0 - 34.0 pg Final  . MCHC 01/13/2016 33.6  32.0 - 36.0 g/dL Final  . RDW 01/13/2016 13.0  11.5 - 14.5 % Final  . Platelets 01/13/2016 137* 150 - 440 K/uL Final  . Neutrophils Relative % 01/13/2016 53  % Final  . Neutro Abs 01/13/2016 1.0* 1.4 - 6.5 K/uL Final  . Lymphocytes Relative 01/13/2016 37  % Final  . Lymphs Abs 01/13/2016 0.7* 1.0 - 3.6 K/uL Final  .  Monocytes Relative 01/13/2016 7  % Final  . Monocytes Absolute 01/13/2016 0.1* 0.2 - 1.0 K/uL Final  . Eosinophils Relative 01/13/2016 1  % Final  . Eosinophils Absolute 01/13/2016 0.0  0 - 0.7 K/uL Final  . Basophils Relative 01/13/2016 2  % Final  . Basophils Absolute 01/13/2016 0.0  0 - 0.1 K/uL Final    Assessment:  TULLIO OLDMAN is a 25 y.o. male with stage IIB left testicular cancer s/p left radical orchiectomy on 11/26/2015.  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  Clinical/pathologic stage is T1 N1-2 S0 M0.  Pre surgery labs on 11/20/2015 revealed an AFP of 411.9, beta-HCG 2,669, and LDH 522 (121-224).  Labs on 12/19/2015 revealed an AFP  of 11.2 (0-8.3), beta-HCG of 2 (0-3), and LDH 179 (121-224).  Labs on 12/23/2015 revealed an AFP of 6.3, beta-HCG of 1, and LDH 160.  Chest, abdomen, and pelvic CT scan on 11/28/2015 revealed no mediastinal adenopathy or suspicious pulmonary nodules.   There were 2 prominent left periaortic retroperitoneal lymph nodes (1.7 cm and 0.9 cm) concerning for testicular nodal metastasis.  There were no abnormal lymph nodes above the renal veins.  There were postsurgical change in the left hemiscrotum and left inguinal canal.  There was a 3.3 cm soft tissue abnormality anterior to the left iliopsoas muscle which could represent a metastatic lymph node (N2 lesion).  Head MRI on 12/17/2015 revealed no evidence of metastatic disease.    Pulmonary function testing revealed a FVC 4.71 liters (76% predicted), FEV1 4.18 liters (88% predicted), TLC 7.23 liters (87% predicted), VC 4.88 (79% predicted) and DLCO of 39.3 mL/mmHg/min (100% predicted).  He is undecided about sperm banking.  He declined evaluation for retroperitoneal lymph node dissection (RPLND).  He declined sperm banking.  He is currently day 15 of cycle #1 BEP  (12/30/2015).  Symptomatically, he feels good.  Exam is unremarkable.  ANC is 1000.  Plan: 1.  Labs today:  CBC with diff, CMP, Mg. 2.  Day 15 bleomycin. 3.  Review fever and neutropenia precautions. 4.  Rx:  Levaquin 500 mg po q day; dis #10 (patient to call first if fever before taking). 5.  Preauth Neulasta with next cycle. 6.  RTC on 01/16/2016 for labs (CBC with diff). 7.  RTC on 01/20/2016 for MD assessment, labs (CBC with diff, CMP, LDH, AFP, beta-HCG), and cycle #2 BEP.   [name], MD  01/13/2016, 9:24 AM

## 2016-01-16 ENCOUNTER — Telehealth: Payer: Self-pay | Admitting: *Deleted

## 2016-01-16 ENCOUNTER — Inpatient Hospital Stay: Payer: BLUE CROSS/BLUE SHIELD | Attending: Hematology and Oncology

## 2016-01-16 DIAGNOSIS — R531 Weakness: Secondary | ICD-10-CM | POA: Diagnosis not present

## 2016-01-16 DIAGNOSIS — K219 Gastro-esophageal reflux disease without esophagitis: Secondary | ICD-10-CM | POA: Diagnosis not present

## 2016-01-16 DIAGNOSIS — R140 Abdominal distension (gaseous): Secondary | ICD-10-CM | POA: Diagnosis not present

## 2016-01-16 DIAGNOSIS — Z634 Disappearance and death of family member: Secondary | ICD-10-CM | POA: Diagnosis not present

## 2016-01-16 DIAGNOSIS — R21 Rash and other nonspecific skin eruption: Secondary | ICD-10-CM | POA: Insufficient documentation

## 2016-01-16 DIAGNOSIS — Z79899 Other long term (current) drug therapy: Secondary | ICD-10-CM | POA: Diagnosis not present

## 2016-01-16 DIAGNOSIS — M25552 Pain in left hip: Secondary | ICD-10-CM | POA: Diagnosis not present

## 2016-01-16 DIAGNOSIS — Z800 Family history of malignant neoplasm of digestive organs: Secondary | ICD-10-CM | POA: Insufficient documentation

## 2016-01-16 DIAGNOSIS — M25551 Pain in right hip: Secondary | ICD-10-CM | POA: Insufficient documentation

## 2016-01-16 DIAGNOSIS — Z5111 Encounter for antineoplastic chemotherapy: Secondary | ICD-10-CM | POA: Insufficient documentation

## 2016-01-16 DIAGNOSIS — Z7689 Persons encountering health services in other specified circumstances: Secondary | ICD-10-CM | POA: Insufficient documentation

## 2016-01-16 DIAGNOSIS — Z808 Family history of malignant neoplasm of other organs or systems: Secondary | ICD-10-CM | POA: Insufficient documentation

## 2016-01-16 DIAGNOSIS — C6292 Malignant neoplasm of left testis, unspecified whether descended or undescended: Secondary | ICD-10-CM | POA: Insufficient documentation

## 2016-01-16 DIAGNOSIS — Z9889 Other specified postprocedural states: Secondary | ICD-10-CM | POA: Insufficient documentation

## 2016-01-16 DIAGNOSIS — F17210 Nicotine dependence, cigarettes, uncomplicated: Secondary | ICD-10-CM | POA: Diagnosis not present

## 2016-01-16 DIAGNOSIS — C6212 Malignant neoplasm of descended left testis: Secondary | ICD-10-CM

## 2016-01-16 DIAGNOSIS — Z803 Family history of malignant neoplasm of breast: Secondary | ICD-10-CM | POA: Insufficient documentation

## 2016-01-16 DIAGNOSIS — J45909 Unspecified asthma, uncomplicated: Secondary | ICD-10-CM | POA: Diagnosis not present

## 2016-01-16 LAB — CBC WITH DIFFERENTIAL/PLATELET
Basophils Absolute: 0 10*3/uL (ref 0–0.1)
Basophils Relative: 1 %
Eosinophils Absolute: 0 10*3/uL (ref 0–0.7)
Eosinophils Relative: 1 %
HCT: 38.3 % — ABNORMAL LOW (ref 40.0–52.0)
Hemoglobin: 12.8 g/dL — ABNORMAL LOW (ref 13.0–18.0)
Lymphocytes Relative: 72 %
Lymphs Abs: 1.2 10*3/uL (ref 1.0–3.6)
MCH: 29.6 pg (ref 26.0–34.0)
MCHC: 33.3 g/dL (ref 32.0–36.0)
MCV: 88.9 fL (ref 80.0–100.0)
Monocytes Absolute: 0.2 10*3/uL (ref 0.2–1.0)
Monocytes Relative: 14 %
Neutro Abs: 0.2 10*3/uL — ABNORMAL LOW (ref 1.4–6.5)
Neutrophils Relative %: 12 %
Platelets: 285 10*3/uL (ref 150–440)
RBC: 4.31 MIL/uL — ABNORMAL LOW (ref 4.40–5.90)
RDW: 13.3 % (ref 11.5–14.5)
WBC: 1.7 10*3/uL — ABNORMAL LOW (ref 3.8–10.6)

## 2016-01-16 NOTE — Telephone Encounter (Signed)
Called pt and no answer.  Then I called his Mom and got her and went over neutropenic precautions and to start the levaquin that he was given already. He will need to start tonight. I told her about checking temp. And if over 100.4 then call md office.  Also about fresh fruits and vegetables needs to be washed by someone else and he not touch them for the potential bacteria on it.  Also about staying away from  People who are sick runny nose, fever, colds,or worse sickness. Pt's mother will give the info to pt.  I tried later and got pt on his cell phone and went over all the above info with him and his mom had called and got all the above info. He has already started the atb this evening

## 2016-01-19 ENCOUNTER — Other Ambulatory Visit: Payer: Self-pay | Admitting: Hematology and Oncology

## 2016-01-19 DIAGNOSIS — Z5111 Encounter for antineoplastic chemotherapy: Secondary | ICD-10-CM | POA: Insufficient documentation

## 2016-01-19 NOTE — Progress Notes (Signed)
[facility] day:  01/20/2016  Chief Complaint: Edgar [name] is a 25 y.o. male with stage IIB testicular cancer who is seen for assessment prior to cycle #2 BEP.  HPI: The patient was last seen in the medical oncology clinic on 01/13/2016.  At that time, he was doing well.  ANC was 1000.  He received day 15 bleomycin of cycle #1 BEP.  We discussed fever and neutropenia precautions.  He was given a prescription for Levaquin if needed.  He had follow-up counts on 01/16/2016.  CBC revealed a hematocrit of 38.3, hemoglobin 12.8, MCV 88.9, platelets 285,000, white count 1700 with an ANC of 200.  He was contacted regarding his counts. Fever and neutropenia precautions were reviewed.  He was instructed to begin Levaquin.  During the interim, he felt weak over the weekend.  He describes doing something for 30 minutes then feeling "tired".  He denies any shortness of breath.  He had no fever.  He lost his beard.  He shaved his head.  His grandmother passed away.  He is overwhelmed.  He is smoking less.   Past Medical History:  Diagnosis Date  . Asthma    AS A CHILD-NO INHALERS  . GERD (gastroesophageal reflux disease)    TUMS PRN    Past Surgical History:  Procedure Laterality Date  . ANKLE FRACTURE SURGERY Right 2004  . ORCHIECTOMY Left 11/26/2015   Procedure: ORCHIECTOMY;  Surgeon: [name], MD;  Location: [facility];  Service: Urology;  Laterality: Left;  radical/Inguinal approach  . PERIPHERAL VASCULAR CATHETERIZATION N/A 12/16/2015   Procedure: [name] Cath Insertion;  Surgeon: [name], MD;  Location: [facility];  Service: Cardiovascular;  Laterality: N/A;  . WISDOM TOOTH EXTRACTION  2017    Family History  Problem Relation Age of Onset  . Skin cancer Mother   . Breast cancer Maternal Grandmother   . Colon cancer Maternal Grandfather   . Diabetes Maternal Grandfather   . Prostate cancer Neg Hx   . Kidney cancer Neg Hx   .  Bladder Cancer Neg Hx     Social History:  reports that he has been smoking Cigarettes.  He has a 4.00 pack-year smoking history. He has quit using smokeless tobacco. His smokeless tobacco use included Snuff. He reports that he uses drugs, including Marijuana. He reports that he does not drink alcohol.  He smokes 1/2 pack a day.  He smokes marijuana.  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  He has a 43 year-old-daughter named [name]. The patient's mother's cell phone number is [phone].  His grandmother recently died.  Her funeral is this week.  He is a Corporate treasurer.  The patient is accompanied by his mother today.  Allergies: No Known Allergies  Current Medications: Current Outpatient Prescriptions  Medication Sig Dispense Refill  . acetaminophen (TYLENOL) 325 MG tablet Take 650 mg by mouth every 6 (six) hours as needed.    Marland Kitchen dexamethasone (DECADRON) 4 MG tablet Take 2 tablets by mouth once a day on the day starting on day 6 of chemotherapy and then take 2 tablets two times a day for 2 days. Take with food. 30 tablet 1  . docusate sodium (COLACE) 100 MG capsule Take 100 mg by mouth 2 (two) times daily.  0  . levofloxacin (LEVAQUIN) 500 MG tablet Take 1 tablet (500 mg total) by mouth daily. As needed if fever.  Call clinic before taking. 10 tablet 0  . lidocaine-prilocaine (EMLA) cream Apply small amount of cream over the portacath site 1 hour before chemotherapy treatment, place saran wrap over the cream to protect your clothing 30 g 1  . LORazepam (ATIVAN) 0.5 MG tablet Take 1 tablet (0.5 mg total) by mouth every 6 (six) hours as needed (Nausea or vomiting). 30 tablet 0  . ondansetron (ZOFRAN) 8 MG tablet Take 1 tablet (8 mg total) by mouth 2 (two) times daily as needed. Start on day 8 of chemotherapy. 30 tablet 1   No current facility-administered medications for this visit.     Review of Systems:  GENERAL:  Feels "ok".  No fevers or sweats.  Weight  down 8 pounds. PERFORMANCE STATUS (ECOG):  0 HEENT:  No visual changes, runny nose, sore throat, mouth sores or tenderness. Lungs: No shortness of breath.  No cough.  No hemoptysis. Cardiac:  No chest pain, palpitations, orthopnea, or PND. GI:  No nausea, vomiting, diarrhea, constipation, melena or hematochezia. GU:  No urgency, frequency, dysuria, or hematuria. Musculoskeletal:  No back pain.  No joint pain.  No muscle tenderness. Extremities:  No pain or swelling. Skin:  Lost beard.  Shaved head.  No rashes or skin changes. Neuro:  No headache, numbness or weakness, balance or coordination issues. Endocrine:  No diabetes, thyroid issues, hot flashes or night sweats. Psych:  No mood changes, depression or anxiety. Pain:  No pain. Review of systems:  All other systems reviewed and found to be negative.  Physical Exam: 266 Blood pressure (!) 139/98, pulse 94, temperature 97.7 F (36.5 C), temperature source Tympanic, resp. rate 18, weight 272 lb 11.3 oz (123.7 kg). GENERAL:  Well developed, well nourished, gentleman sitting comfortably in the exam room in no acute distress. MENTAL STATUS:  Alert and oriented to person, place and time. HEAD:  Wearing a green cap.  Alopecia totalis.  Normocephalic, atraumatic, face symmetric, no Cushingoid features. EYES:  Pupils equal round and reactive to light and accomodation.  No conjunctivitis or scleral icterus. ENT:  Oropharynx clear without lesion.  Tongue normal. Mucous membranes moist.  RESPIRATORY:  Clear to auscultation without rales, wheezes or rhonchi. CARDIOVASCULAR:  Regular rate and rhythm without murmur, rub or gallop. ABDOMEN:  Soft, non-tender, with active bowel sounds, and no appreciable hepatosplenomegaly.  No masses. SKIN:  No rashes, ulcers or lesions. EXTREMITIES: No edema, no skin discoloration or tenderness.  No palpable cords. LYMPH NODES: No palpable cervical, supraclavicular, axillary or inguinal adenopathy  NEUROLOGICAL:  Unremarkable. PSYCH:  Appropriate.   Appointment on 01/20/2016  Component Date Value Ref Range Status  . WBC 01/20/2016 4.1  3.8 - 10.6 K/uL Final  . RBC 01/20/2016 4.41  4.40 - 5.90 MIL/uL Final  . Hemoglobin 01/20/2016 13.4  13.0 - 18.0 g/dL Final  . HCT 01/20/2016 38.9* 40.0 - 52.0 % Final  . MCV 01/20/2016 88.4  80.0 - 100.0 fL Final  . MCH 01/20/2016 30.5  26.0 - 34.0 pg Final  . MCHC 01/20/2016 34.5  32.0 - 36.0 g/dL Final  . RDW 01/20/2016 13.7  11.5 - 14.5 % Final  . Platelets 01/20/2016 499* 150 - 440 K/uL Final  . Neutrophils Relative % 01/20/2016 39  % Final  . Neutro Abs 01/20/2016 1.6  1.4 - 6.5 K/uL Final  . Lymphocytes Relative 01/20/2016 40  % Final  . Lymphs Abs 01/20/2016 1.7  1.0 - 3.6 K/uL Final  . Monocytes Relative 01/20/2016 20  % Final  .  Monocytes Absolute 01/20/2016 0.8  0.2 - 1.0 K/uL Final  . Eosinophils Relative 01/20/2016 1  % Final  . Eosinophils Absolute 01/20/2016 0.0  0 - 0.7 K/uL Final  . Basophils Relative 01/20/2016 0  % Final  . Basophils Absolute 01/20/2016 0.0  0 - 0.1 K/uL Final  . Sodium 01/20/2016 138  135 - 145 mmol/L Final  . Potassium 01/20/2016 4.0  3.5 - 5.1 mmol/L Final  . Chloride 01/20/2016 106  101 - 111 mmol/L Final  . CO2 01/20/2016 24  22 - 32 mmol/L Final  . Glucose, Bld 01/20/2016 113* 65 - 99 mg/dL Final  . BUN 01/20/2016 20  6 - 20 mg/dL Final  . Creatinine, Ser 01/20/2016 1.01  0.61 - 1.24 mg/dL Final  . Calcium 01/20/2016 9.0  8.9 - 10.3 mg/dL Final  . Total Protein 01/20/2016 7.3  6.5 - 8.1 g/dL Final  . Albumin 01/20/2016 4.1  3.5 - 5.0 g/dL Final  . AST 01/20/2016 20  15 - 41 U/L Final  . ALT 01/20/2016 23  17 - 63 U/L Final  . Alkaline Phosphatase 01/20/2016 59  38 - 126 U/L Final  . Total Bilirubin 01/20/2016 <0.1* 0.3 - 1.2 mg/dL Final  . GFR calc non Af Amer 01/20/2016 >60  >60 mL/min Final  . GFR calc Af Amer 01/20/2016 >60  >60 mL/min Final   Comment: (NOTE) The eGFR has been calculated using the CKD EPI  equation. This calculation has not been validated in all clinical situations. eGFR's persistently <60 mL/min signify possible Chronic Kidney Disease.   . Anion gap 01/20/2016 8  5 - 15 Final  . LDH 01/20/2016 153  98 - 192 U/L Final    Assessment:  Edgar [name] is a 25 y.o. male with stage IIB left testicular cancer s/p left radical orchiectomy on 11/26/2015.  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  Clinical/pathologic stage is T1 N1-2 S0 M0.  Pre surgery labs on 11/20/2015 revealed an AFP of 411.9, beta-HCG 2,669, and LDH 522 (121-224).  Labs on 12/19/2015 revealed an AFP of 11.2 (0-8.3), beta-HCG of 2 (0-3), and LDH 179 (121-224).  Labs on 12/23/2015 revealed an AFP of 6.3, beta-HCG of 1, and LDH 160.  Chest, abdomen, and pelvic CT scan on 11/28/2015 revealed no mediastinal adenopathy or suspicious pulmonary nodules.   There were 2 prominent left periaortic retroperitoneal lymph nodes (1.7 cm and 0.9 cm) concerning for testicular nodal metastasis.  There were no abnormal lymph nodes above the renal veins.  There were postsurgical change in the left hemiscrotum and left inguinal canal.  There was a 3.3 cm soft tissue abnormality anterior to the left iliopsoas muscle which could represent a metastatic lymph node (N2 lesion).  Head MRI on 12/17/2015 revealed no evidence of metastatic disease.    Pulmonary function testing revealed a FVC 4.71 liters (76% predicted), FEV1 4.18 liters (88% predicted), TLC 7.23 liters (87% predicted), VC 4.88 (79% predicted) and DLCO of 39.3 mL/mmHg/min (100% predicted).  He is undecided about sperm banking.  He declined evaluation for retroperitoneal lymph node dissection (RPLND).  He declined sperm banking.  He is s/p cycle #1 BEP (12/30/2015).  ANC was 200 on day 18 of cycle #1.  He has been on Levaquin prophylaxis.  Symptomatically, he is  overwhelmed with recent events.  Exam is normal.  ANC is 1600.  Plan: 1.  Labs  today:  CBC with diff, CMP, LDH, AFP, beta-HCG. 2.  Discuss discontinuation of Levaquin. 3.  Discuss use of Neulasta with chemotherapy secondary to neutropenia.  Discuss use of Claritin to prevent bone pain. 4.  Discuss patient's thoughts about therapy and recent loss of grandmother.  After a long discussion, patient decided to postpone therapy 1 week. 5.  No chemotherapy today. 6.  Encourage smoking cessation.   7.  RTC in 1 week for MD assessment, labs (CBC with diff, CMP, Mg), and cycle #2 BEP (cisplatin and etoposide daily; bleomycin also on day 1).   [name], MD  01/20/2016, 9:45 AM

## 2016-01-20 ENCOUNTER — Encounter: Payer: Self-pay | Admitting: Hematology and Oncology

## 2016-01-20 ENCOUNTER — Other Ambulatory Visit: Payer: Self-pay | Admitting: *Deleted

## 2016-01-20 ENCOUNTER — Inpatient Hospital Stay: Payer: BLUE CROSS/BLUE SHIELD

## 2016-01-20 ENCOUNTER — Inpatient Hospital Stay (HOSPITAL_BASED_OUTPATIENT_CLINIC_OR_DEPARTMENT_OTHER): Payer: BLUE CROSS/BLUE SHIELD | Admitting: Hematology and Oncology

## 2016-01-20 VITALS — BP 139/98 | HR 94 | Temp 97.7°F | Resp 18 | Wt 272.7 lb

## 2016-01-20 DIAGNOSIS — R531 Weakness: Secondary | ICD-10-CM

## 2016-01-20 DIAGNOSIS — K219 Gastro-esophageal reflux disease without esophagitis: Secondary | ICD-10-CM

## 2016-01-20 DIAGNOSIS — F17210 Nicotine dependence, cigarettes, uncomplicated: Secondary | ICD-10-CM | POA: Diagnosis not present

## 2016-01-20 DIAGNOSIS — Z803 Family history of malignant neoplasm of breast: Secondary | ICD-10-CM

## 2016-01-20 DIAGNOSIS — J45909 Unspecified asthma, uncomplicated: Secondary | ICD-10-CM

## 2016-01-20 DIAGNOSIS — C6292 Malignant neoplasm of left testis, unspecified whether descended or undescended: Secondary | ICD-10-CM | POA: Diagnosis not present

## 2016-01-20 DIAGNOSIS — Z7689 Persons encountering health services in other specified circumstances: Secondary | ICD-10-CM

## 2016-01-20 DIAGNOSIS — Z79899 Other long term (current) drug therapy: Secondary | ICD-10-CM

## 2016-01-20 DIAGNOSIS — C6212 Malignant neoplasm of descended left testis: Secondary | ICD-10-CM

## 2016-01-20 DIAGNOSIS — Z808 Family history of malignant neoplasm of other organs or systems: Secondary | ICD-10-CM

## 2016-01-20 DIAGNOSIS — Z95828 Presence of other vascular implants and grafts: Secondary | ICD-10-CM

## 2016-01-20 DIAGNOSIS — Z9889 Other specified postprocedural states: Secondary | ICD-10-CM

## 2016-01-20 DIAGNOSIS — Z800 Family history of malignant neoplasm of digestive organs: Secondary | ICD-10-CM

## 2016-01-20 DIAGNOSIS — Z634 Disappearance and death of family member: Secondary | ICD-10-CM

## 2016-01-20 LAB — COMPREHENSIVE METABOLIC PANEL
ALT: 23 U/L (ref 17–63)
AST: 20 U/L (ref 15–41)
Albumin: 4.1 g/dL (ref 3.5–5.0)
Alkaline Phosphatase: 59 U/L (ref 38–126)
Anion gap: 8 (ref 5–15)
BUN: 20 mg/dL (ref 6–20)
CO2: 24 mmol/L (ref 22–32)
Calcium: 9 mg/dL (ref 8.9–10.3)
Chloride: 106 mmol/L (ref 101–111)
Creatinine, Ser: 1.01 mg/dL (ref 0.61–1.24)
GFR calc Af Amer: >60 mL/min (ref >60)
GFR calc non Af Amer: >60 mL/min (ref >60)
Glucose, Bld: 113 mg/dL — ABNORMAL HIGH (ref 65–99)
Potassium: 4 mmol/L (ref 3.5–5.1)
Sodium: 138 mmol/L (ref 135–145)
Total Bilirubin: <0.1 mg/dL — ABNORMAL LOW (ref 0.3–1.2)
Total Protein: 7.3 g/dL (ref 6.5–8.1)

## 2016-01-20 LAB — CBC WITH DIFFERENTIAL/PLATELET
Basophils Absolute: 0 10*3/uL (ref 0–0.1)
Basophils Relative: 0 %
Eosinophils Absolute: 0 10*3/uL (ref 0–0.7)
Eosinophils Relative: 1 %
HCT: 38.9 % — ABNORMAL LOW (ref 40.0–52.0)
Hemoglobin: 13.4 g/dL (ref 13.0–18.0)
Lymphocytes Relative: 40 %
Lymphs Abs: 1.7 10*3/uL (ref 1.0–3.6)
MCH: 30.5 pg (ref 26.0–34.0)
MCHC: 34.5 g/dL (ref 32.0–36.0)
MCV: 88.4 fL (ref 80.0–100.0)
Monocytes Absolute: 0.8 10*3/uL (ref 0.2–1.0)
Monocytes Relative: 20 %
Neutro Abs: 1.6 10*3/uL (ref 1.4–6.5)
Neutrophils Relative %: 39 %
Platelets: 499 10*3/uL — ABNORMAL HIGH (ref 150–440)
RBC: 4.41 MIL/uL (ref 4.40–5.90)
RDW: 13.7 % (ref 11.5–14.5)
WBC: 4.1 10*3/uL (ref 3.8–10.6)

## 2016-01-20 LAB — LACTATE DEHYDROGENASE: LDH: 153 U/L (ref 98–192)

## 2016-01-20 MED ORDER — HEPARIN SOD (PORK) LOCK FLUSH 100 UNIT/ML IV SOLN
500.0000 [IU] | Freq: Once | INTRAVENOUS | Status: AC
  Administered 2016-01-20: 500 [IU] via INTRAVENOUS
  Filled 2016-01-20: qty 5

## 2016-01-20 MED ORDER — SODIUM CHLORIDE 0.9% FLUSH
10.0000 mL | INTRAVENOUS | Status: DC | PRN
  Administered 2016-01-20: 10 mL via INTRAVENOUS
  Filled 2016-01-20: qty 10

## 2016-01-20 NOTE — Progress Notes (Signed)
Patient states he was weak over the weekend. Has been drinking fluids better. No dizziness today.  BP elevated 139/98  HR 94.  Retake 121/86  HR 96 .  Patient's grandmother passed away yesterday.  Also anxious about whether or not he can get his treatment today due to WBC.

## 2016-01-21 ENCOUNTER — Ambulatory Visit: Payer: BLUE CROSS/BLUE SHIELD

## 2016-01-21 LAB — AFP TUMOR MARKER: AFP-Tumor Marker: 1.8 ng/mL (ref 0.0–8.3)

## 2016-01-21 LAB — BETA HCG QUANT (REF LAB): Beta hCG, Tumor Marker: <1 m[IU]/mL (ref 0–3)

## 2016-01-25 ENCOUNTER — Other Ambulatory Visit: Payer: Self-pay | Admitting: Hematology and Oncology

## 2016-01-27 ENCOUNTER — Inpatient Hospital Stay: Payer: BLUE CROSS/BLUE SHIELD

## 2016-01-27 ENCOUNTER — Other Ambulatory Visit: Payer: Self-pay | Admitting: Hematology and Oncology

## 2016-01-27 ENCOUNTER — Encounter: Payer: Self-pay | Admitting: Hematology and Oncology

## 2016-01-27 ENCOUNTER — Inpatient Hospital Stay (HOSPITAL_BASED_OUTPATIENT_CLINIC_OR_DEPARTMENT_OTHER): Payer: BLUE CROSS/BLUE SHIELD | Admitting: Hematology and Oncology

## 2016-01-27 VITALS — BP 131/88 | HR 99 | Temp 95.6°F | Resp 18 | Wt 279.8 lb

## 2016-01-27 VITALS — BP 118/80 | HR 85 | Temp 96.6°F | Resp 18

## 2016-01-27 DIAGNOSIS — Z808 Family history of malignant neoplasm of other organs or systems: Secondary | ICD-10-CM

## 2016-01-27 DIAGNOSIS — C6212 Malignant neoplasm of descended left testis: Secondary | ICD-10-CM

## 2016-01-27 DIAGNOSIS — Z5111 Encounter for antineoplastic chemotherapy: Secondary | ICD-10-CM

## 2016-01-27 DIAGNOSIS — Z9889 Other specified postprocedural states: Secondary | ICD-10-CM

## 2016-01-27 DIAGNOSIS — J45909 Unspecified asthma, uncomplicated: Secondary | ICD-10-CM

## 2016-01-27 DIAGNOSIS — R531 Weakness: Secondary | ICD-10-CM | POA: Diagnosis not present

## 2016-01-27 DIAGNOSIS — F17210 Nicotine dependence, cigarettes, uncomplicated: Secondary | ICD-10-CM

## 2016-01-27 DIAGNOSIS — K219 Gastro-esophageal reflux disease without esophagitis: Secondary | ICD-10-CM

## 2016-01-27 DIAGNOSIS — C6292 Malignant neoplasm of left testis, unspecified whether descended or undescended: Secondary | ICD-10-CM

## 2016-01-27 DIAGNOSIS — Z79899 Other long term (current) drug therapy: Secondary | ICD-10-CM

## 2016-01-27 DIAGNOSIS — Z800 Family history of malignant neoplasm of digestive organs: Secondary | ICD-10-CM

## 2016-01-27 DIAGNOSIS — Z803 Family history of malignant neoplasm of breast: Secondary | ICD-10-CM

## 2016-01-27 DIAGNOSIS — Z634 Disappearance and death of family member: Secondary | ICD-10-CM

## 2016-01-27 DIAGNOSIS — Z7689 Persons encountering health services in other specified circumstances: Secondary | ICD-10-CM | POA: Diagnosis not present

## 2016-01-27 LAB — COMPREHENSIVE METABOLIC PANEL
ALT: 34 U/L (ref 17–63)
AST: 27 U/L (ref 15–41)
Albumin: 4 g/dL (ref 3.5–5.0)
Alkaline Phosphatase: 51 U/L (ref 38–126)
Anion gap: 7 (ref 5–15)
BUN: 14 mg/dL (ref 6–20)
CO2: 28 mmol/L (ref 22–32)
Calcium: 8.9 mg/dL (ref 8.9–10.3)
Chloride: 102 mmol/L (ref 101–111)
Creatinine, Ser: 0.8 mg/dL (ref 0.61–1.24)
GFR calc Af Amer: >60 mL/min (ref >60)
GFR calc non Af Amer: >60 mL/min (ref >60)
Glucose, Bld: 101 mg/dL — ABNORMAL HIGH (ref 65–99)
Potassium: 4.1 mmol/L (ref 3.5–5.1)
Sodium: 137 mmol/L (ref 135–145)
Total Bilirubin: 0.3 mg/dL (ref 0.3–1.2)
Total Protein: 7 g/dL (ref 6.5–8.1)

## 2016-01-27 LAB — CBC WITH DIFFERENTIAL/PLATELET
Basophils Absolute: 0.1 10*3/uL (ref 0–0.1)
Basophils Relative: 1 %
Eosinophils Absolute: 0 10*3/uL (ref 0–0.7)
Eosinophils Relative: 0 %
HCT: 38.2 % — ABNORMAL LOW (ref 40.0–52.0)
Hemoglobin: 12.9 g/dL — ABNORMAL LOW (ref 13.0–18.0)
Lymphocytes Relative: 20 %
Lymphs Abs: 1.7 10*3/uL (ref 1.0–3.6)
MCH: 29.9 pg (ref 26.0–34.0)
MCHC: 33.9 g/dL (ref 32.0–36.0)
MCV: 88.1 fL (ref 80.0–100.0)
Monocytes Absolute: 1 10*3/uL (ref 0.2–1.0)
Monocytes Relative: 11 %
Neutro Abs: 5.9 10*3/uL (ref 1.4–6.5)
Neutrophils Relative %: 68 %
Platelets: 580 10*3/uL — ABNORMAL HIGH (ref 150–440)
RBC: 4.33 MIL/uL — ABNORMAL LOW (ref 4.40–5.90)
RDW: 14.3 % (ref 11.5–14.5)
WBC: 8.7 10*3/uL (ref 3.8–10.6)

## 2016-01-27 LAB — LACTATE DEHYDROGENASE: LDH: 202 U/L — ABNORMAL HIGH (ref 98–192)

## 2016-01-27 LAB — MAGNESIUM: Magnesium: 2.1 mg/dL (ref 1.7–2.4)

## 2016-01-27 MED ORDER — PALONOSETRON HCL INJECTION 0.25 MG/5ML
0.2500 mg | Freq: Once | INTRAVENOUS | Status: AC
  Administered 2016-01-27: 0.25 mg via INTRAVENOUS
  Filled 2016-01-27: qty 5

## 2016-01-27 MED ORDER — SODIUM CHLORIDE 0.9% FLUSH
10.0000 mL | INTRAVENOUS | Status: DC | PRN
Start: 2016-01-27 — End: 2016-01-27
  Administered 2016-01-27: 10 mL
  Filled 2016-01-27: qty 10

## 2016-01-27 MED ORDER — SODIUM CHLORIDE 0.9 % IV SOLN
Freq: Once | INTRAVENOUS | Status: AC
  Administered 2016-01-27: 13:00:00 via INTRAVENOUS
  Filled 2016-01-27: qty 5

## 2016-01-27 MED ORDER — HEPARIN SOD (PORK) LOCK FLUSH 100 UNIT/ML IV SOLN
500.0000 [IU] | Freq: Once | INTRAVENOUS | Status: AC | PRN
  Administered 2016-01-27: 500 [IU]
  Filled 2016-01-27 (×2): qty 5

## 2016-01-27 MED ORDER — SODIUM CHLORIDE 0.9 % IV SOLN
Freq: Once | INTRAVENOUS | Status: AC
Start: 2016-01-27 — End: 2016-01-27
  Administered 2016-01-27: 10:00:00 via INTRAVENOUS
  Filled 2016-01-27: qty 1000

## 2016-01-27 MED ORDER — POTASSIUM CHLORIDE 2 MEQ/ML IV SOLN
Freq: Once | INTRAVENOUS | Status: AC
  Administered 2016-01-27: 10:00:00 via INTRAVENOUS
  Filled 2016-01-27: qty 1000

## 2016-01-27 MED ORDER — CISPLATIN CHEMO INJECTION 100MG/100ML
20.0000 mg/m2 | Freq: Once | INTRAVENOUS | Status: AC
  Administered 2016-01-27: 51 mg via INTRAVENOUS
  Filled 2016-01-27: qty 51

## 2016-01-27 MED ORDER — SODIUM CHLORIDE 0.9 % IV SOLN
100.0000 mg/m2 | Freq: Once | INTRAVENOUS | Status: AC
  Administered 2016-01-27: 250 mg via INTRAVENOUS
  Filled 2016-01-27: qty 12.5

## 2016-01-27 MED ORDER — SODIUM CHLORIDE 0.9 % IV SOLN
30.0000 [IU] | Freq: Once | INTRAVENOUS | Status: AC
  Administered 2016-01-27: 30 [IU] via INTRAVENOUS
  Filled 2016-01-27: qty 10

## 2016-01-27 NOTE — Progress Notes (Signed)
Patient offers no complaints today.  States he feels good.

## 2016-01-27 NOTE — Progress Notes (Addendum)
Edgar [name] day:  01/27/2016  Chief Complaint: Edgar [name] is a 25 y.o. male with stage IIB testicular cancer who is seen for assessment prior to cycle #2 BEP.  HPI: The patient was last seen in the medical oncology [name] on 01/20/2016.  At that time, he was doing "ok".  He was overwhelmed with the recent loss of his grandmother.  Decision was made to postpone chemotherapy x 1 week.  As his counts had recovered, Levaquin was discontinued.  During the interim, he has done well.  He voices no complaints.   Past Medical History:  Diagnosis Date  . Asthma    AS A CHILD-NO INHALERS  . GERD (gastroesophageal reflux disease)    TUMS PRN    Past Surgical History:  Procedure Laterality Date  . ANKLE FRACTURE SURGERY Right 2004  . ORCHIECTOMY Left 11/26/2015   Procedure: ORCHIECTOMY;  Surgeon: [name], MD;  Location: [facility];  Service: Urology;  Laterality: Left;  radical/Inguinal approach  . PERIPHERAL VASCULAR CATHETERIZATION N/A 12/16/2015   Procedure: [name] Cath Insertion;  Surgeon: [name], MD;  Location: [facility];  Service: Cardiovascular;  Laterality: N/A;  . WISDOM TOOTH EXTRACTION  2017    Family History  Problem Relation Age of Onset  . Skin cancer Mother   . Breast cancer Maternal Grandmother   . Colon cancer Maternal Grandfather   . Diabetes Maternal Grandfather   . Prostate cancer Neg Hx   . Kidney cancer Neg Hx   . Bladder Cancer Neg Hx     Social History:  reports that he has been smoking Cigarettes.  He has a 4.00 pack-year smoking history. He has quit using smokeless tobacco. His smokeless tobacco use included Snuff. He reports that he uses drugs, including Marijuana. He reports that he does not drink alcohol.  He smokes 1/2 pack a day.  He smokes marijuana.  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  He has a 23 year-old-daughter named [name]. The patient's  mother's cell phone number is [phone].  His grandmother recently died.  Her funeral was last week.  He was a Corporate treasurer.  The patient is accompanied by his mother today.  Allergies: No Known Allergies  Current Medications: Current Outpatient Prescriptions  Medication Sig Dispense Refill  . acetaminophen (TYLENOL) 325 MG tablet Take 650 mg by mouth every 6 (six) hours as needed.    Marland Kitchen dexamethasone (DECADRON) 4 MG tablet Take 2 tablets by mouth once a day on the day starting on day 6 of chemotherapy and then take 2 tablets two times a day for 2 days. Take with food. 30 tablet 1  . docusate sodium (COLACE) 100 MG capsule Take 100 mg by mouth 2 (two) times daily.  0  . lidocaine-prilocaine (EMLA) cream Apply small amount of cream over the portacath site 1 hour before chemotherapy treatment, place saran wrap over the cream to protect your clothing 30 g 1  . LORazepam (ATIVAN) 0.5 MG tablet Take 1 tablet (0.5 mg total) by mouth every 6 (six) hours as needed (Nausea or vomiting). 30 tablet 0  . ondansetron (ZOFRAN) 8 MG tablet Take 1 tablet (8 mg total) by mouth 2 (two) times daily as needed. Start on day 8 of chemotherapy. 30 tablet 1   No current facility-administered medications for this visit.     Review of Systems:  GENERAL:  Feels good.  No fevers  or sweats.  Weight up 7 pounds. PERFORMANCE STATUS (ECOG):  0 HEENT:  No visual changes, runny nose, sore throat, mouth sores or tenderness. Lungs: No shortness of breath.  No cough.  No hemoptysis. Cardiac:  No chest pain, palpitations, orthopnea, or PND. GI:  No nausea, vomiting, diarrhea, constipation, melena or hematochezia. GU:  No urgency, frequency, dysuria, or hematuria. Musculoskeletal:  No back pain.  No joint pain.  No muscle tenderness. Extremities:  No pain or swelling. Skin:  Shaved head.  No rashes or skin changes. Neuro:  No headache, numbness or weakness, balance or coordination issues. Endocrine:  No diabetes, thyroid  issues, hot flashes or night sweats. Psych:  No mood changes, depression or anxiety. Pain:  No pain. Review of systems:  All other systems reviewed and found to be negative.  Physical Exam: 266 Blood pressure 131/88, pulse 99, temperature (!) 95.6 F (35.3 C), temperature source Tympanic, resp. rate 18, weight 279 lb 12.2 oz (126.9 kg). GENERAL:  Well developed, well nourished, gentleman sitting comfortably in the exam room in no acute distress. MENTAL STATUS:  Alert and oriented to person, place and time. HEAD:  Alopecia totalis.  Normocephalic, atraumatic, face symmetric, no Cushingoid features. EYES:  Pupils equal round and reactive to light and accomodation.  No conjunctivitis or scleral icterus. ENT:  Oropharynx clear without lesion.  Tongue normal. Mucous membranes moist.  RESPIRATORY:  Clear to auscultation without rales, wheezes or rhonchi. CARDIOVASCULAR:  Regular rate and rhythm without murmur, rub or gallop. ABDOMEN:  Soft, non-tender, with active bowel sounds, and no appreciable hepatosplenomegaly.  No masses. SKIN:  No rashes, ulcers or lesions. EXTREMITIES: No edema, no skin discoloration or tenderness.  No palpable cords. LYMPH NODES: No palpable cervical, supraclavicular, axillary or inguinal adenopathy  NEUROLOGICAL: Unremarkable. PSYCH:  Appropriate.   Appointment on 01/27/2016  Component Date Value Ref Range Status  . WBC 01/27/2016 8.7  3.8 - 10.6 K/uL Final  . RBC 01/27/2016 4.33* 4.40 - 5.90 MIL/uL Final  . Hemoglobin 01/27/2016 12.9* 13.0 - 18.0 g/dL Final  . HCT 01/27/2016 38.2* 40.0 - 52.0 % Final  . MCV 01/27/2016 88.1  80.0 - 100.0 fL Final  . MCH 01/27/2016 29.9  26.0 - 34.0 pg Final  . MCHC 01/27/2016 33.9  32.0 - 36.0 g/dL Final  . RDW 01/27/2016 14.3  11.5 - 14.5 % Final  . Platelets 01/27/2016 580* 150 - 440 K/uL Final  . Neutrophils Relative % 01/27/2016 68  % Final  . Neutro Abs 01/27/2016 5.9  1.4 - 6.5 K/uL Final  . Lymphocytes Relative  01/27/2016 20  % Final  . Lymphs Abs 01/27/2016 1.7  1.0 - 3.6 K/uL Final  . Monocytes Relative 01/27/2016 11  % Final  . Monocytes Absolute 01/27/2016 1.0  0.2 - 1.0 K/uL Final  . Eosinophils Relative 01/27/2016 0  % Final  . Eosinophils Absolute 01/27/2016 0.0  0 - 0.7 K/uL Final  . Basophils Relative 01/27/2016 1  % Final  . Basophils Absolute 01/27/2016 0.1  0 - 0.1 K/uL Final  . Sodium 01/27/2016 137  135 - 145 mmol/L Final  . Potassium 01/27/2016 4.1  3.5 - 5.1 mmol/L Final  . Chloride 01/27/2016 102  101 - 111 mmol/L Final  . CO2 01/27/2016 28  22 - 32 mmol/L Final  . Glucose, Bld 01/27/2016 101* 65 - 99 mg/dL Final  . BUN 01/27/2016 14  6 - 20 mg/dL Final  . Creatinine, Ser 01/27/2016 0.80  0.61 - 1.24 mg/dL  Final  . Calcium 01/27/2016 8.9  8.9 - 10.3 mg/dL Final  . Total Protein 01/27/2016 7.0  6.5 - 8.1 g/dL Final  . Albumin 01/27/2016 4.0  3.5 - 5.0 g/dL Final  . AST 01/27/2016 27  15 - 41 U/L Final  . ALT 01/27/2016 34  17 - 63 U/L Final  . Alkaline Phosphatase 01/27/2016 51  38 - 126 U/L Final  . Total Bilirubin 01/27/2016 0.3  0.3 - 1.2 mg/dL Final  . GFR calc non Af Amer 01/27/2016 >60  >60 mL/min Final  . GFR calc Af Amer 01/27/2016 >60  >60 mL/min Final   Comment: (NOTE) The eGFR has been calculated using the CKD EPI equation. This calculation has not been validated in all clinical situations. eGFR's persistently <60 mL/min signify possible Chronic Kidney Disease.   . Anion gap 01/27/2016 7  5 - 15 Final  . Magnesium 01/27/2016 2.1  1.7 - 2.4 mg/dL Final  . LDH 01/27/2016 202* 98 - 192 U/L Final    Assessment:  JAMISON SOWARD is a 25 y.o. male with stage IIB left testicular cancer s/p left radical orchiectomy on 11/26/2015.  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  Clinical/pathologic stage is T1 N1-2 S0 M0.  Pre surgery labs on 11/20/2015  revealed an AFP of 411.9, beta-HCG 2,669, and LDH 522 (121-224).  Labs on 12/19/2015 revealed an AFP of 11.2 (0-8.3), beta-HCG of 2 (0-3), and LDH 179 (121-224).  Labs on 12/23/2015 revealed an AFP of 6.3, beta-HCG of 1, and LDH 160.  Chest, abdomen, and pelvic CT scan on 11/28/2015 revealed no mediastinal adenopathy or suspicious pulmonary nodules.   There were 2 prominent left periaortic retroperitoneal lymph nodes (1.7 cm and 0.9 cm) concerning for testicular nodal metastasis.  There were no abnormal lymph nodes above the renal veins.  There were postsurgical change in the left hemiscrotum and left inguinal canal.  There was a 3.3 cm soft tissue abnormality anterior to the left iliopsoas muscle which could represent a metastatic lymph node (N2 lesion).  Head MRI on 12/17/2015 revealed no evidence of metastatic disease.    Pulmonary function testing revealed a FVC 4.71 liters (76% predicted), FEV1 4.18 liters (88% predicted), TLC 7.23 liters (87% predicted), VC 4.88 (79% predicted) and DLCO of 39.3 mL/mmHg/min (100% predicted).  He is undecided about sperm banking.  He declined evaluation for retroperitoneal lymph node dissection (RPLND).  He declined sperm banking.  He is s/p cycle #1 BEP (12/30/2015).  ANC was 200 on day 18 of cycle #1.  Symptomatically, he is doing well.  Exam is normal.  ANC is 5900.  Plan: 1.  Labs today:  CBC with diff, CMP, LDH, AFP, beta-HCG. 2.  Begin cycle #2 BEP today. 3.  On Pro Neulasta on 01/31/2016. 4.  RTC in 1 week for MD assessment, labs (CBC with diff, CMP, Mg), and day 8 bleomycin.   Edgar Asal, MD  01/27/2016, 9:26 AM

## 2016-01-28 ENCOUNTER — Inpatient Hospital Stay: Payer: BLUE CROSS/BLUE SHIELD

## 2016-01-28 VITALS — BP 118/75 | HR 96 | Temp 96.8°F | Resp 18

## 2016-01-28 DIAGNOSIS — C6212 Malignant neoplasm of descended left testis: Secondary | ICD-10-CM

## 2016-01-28 DIAGNOSIS — C6292 Malignant neoplasm of left testis, unspecified whether descended or undescended: Secondary | ICD-10-CM | POA: Diagnosis not present

## 2016-01-28 LAB — BETA HCG QUANT (REF LAB): Beta hCG, Tumor Marker: <1 m[IU]/mL (ref 0–3)

## 2016-01-28 LAB — AFP TUMOR MARKER: AFP-Tumor Marker: 1.3 ng/mL (ref 0.0–8.3)

## 2016-01-28 MED ORDER — SODIUM CHLORIDE 0.9 % IJ SOLN
10.0000 mL | Freq: Once | INTRAMUSCULAR | Status: AC
  Administered 2016-01-28: 10 mL via INTRAVENOUS
  Filled 2016-01-28: qty 10

## 2016-01-28 MED ORDER — SODIUM CHLORIDE 0.9 % IV SOLN
100.0000 mg/m2 | Freq: Once | INTRAVENOUS | Status: AC
  Administered 2016-01-28: 250 mg via INTRAVENOUS
  Filled 2016-01-28: qty 12.5

## 2016-01-28 MED ORDER — DEXAMETHASONE SODIUM PHOSPHATE 10 MG/ML IJ SOLN
10.0000 mg | Freq: Once | INTRAMUSCULAR | Status: AC
  Administered 2016-01-28: 10 mg via INTRAVENOUS
  Filled 2016-01-28: qty 1

## 2016-01-28 MED ORDER — SODIUM CHLORIDE 0.9 % IV SOLN: 10.0000 mg | Freq: Once | INTRAVENOUS | Status: DC

## 2016-01-28 MED ORDER — HEPARIN SOD (PORK) LOCK FLUSH 100 UNIT/ML IV SOLN
500.0000 [IU] | Freq: Once | INTRAVENOUS | Status: AC
  Administered 2016-01-28: 500 [IU] via INTRAVENOUS
  Filled 2016-01-28: qty 5

## 2016-01-28 MED ORDER — SODIUM CHLORIDE 0.9 % IV SOLN
Freq: Once | INTRAVENOUS | Status: AC
  Administered 2016-01-28: 09:00:00 via INTRAVENOUS
  Filled 2016-01-28: qty 1000

## 2016-01-28 MED ORDER — SODIUM CHLORIDE 0.9 % IV SOLN
20.0000 mg/m2 | Freq: Once | INTRAVENOUS | Status: AC
  Administered 2016-01-28: 51 mg via INTRAVENOUS
  Filled 2016-01-28: qty 51

## 2016-01-28 MED ORDER — POTASSIUM CHLORIDE 2 MEQ/ML IV SOLN
Freq: Once | INTRAVENOUS | Status: AC
  Administered 2016-01-28: 10:00:00 via INTRAVENOUS
  Filled 2016-01-28: qty 1000

## 2016-01-29 ENCOUNTER — Inpatient Hospital Stay: Payer: BLUE CROSS/BLUE SHIELD

## 2016-01-29 VITALS — BP 117/76 | HR 60 | Temp 97.0°F | Resp 18

## 2016-01-29 DIAGNOSIS — C6292 Malignant neoplasm of left testis, unspecified whether descended or undescended: Secondary | ICD-10-CM | POA: Diagnosis not present

## 2016-01-29 DIAGNOSIS — C6212 Malignant neoplasm of descended left testis: Secondary | ICD-10-CM

## 2016-01-29 MED ORDER — SODIUM CHLORIDE 0.9 % IV SOLN
Freq: Once | INTRAVENOUS | Status: AC
  Administered 2016-01-29: 09:00:00 via INTRAVENOUS
  Filled 2016-01-29: qty 1000

## 2016-01-29 MED ORDER — POTASSIUM CHLORIDE 2 MEQ/ML IV SOLN
Freq: Once | INTRAVENOUS | Status: AC
  Administered 2016-01-29: 09:00:00 via INTRAVENOUS
  Filled 2016-01-29: qty 10

## 2016-01-29 MED ORDER — SODIUM CHLORIDE 0.9 % IV SOLN
100.0000 mg/m2 | Freq: Once | INTRAVENOUS | Status: AC
  Administered 2016-01-29: 250 mg via INTRAVENOUS
  Filled 2016-01-29: qty 12.5

## 2016-01-29 MED ORDER — PALONOSETRON HCL INJECTION 0.25 MG/5ML
0.2500 mg | Freq: Once | INTRAVENOUS | Status: AC
  Administered 2016-01-29: 0.25 mg via INTRAVENOUS
  Filled 2016-01-29: qty 5

## 2016-01-29 MED ORDER — SODIUM CHLORIDE 0.9 % IV SOLN
20.0000 mg/m2 | Freq: Once | INTRAVENOUS | Status: AC
  Administered 2016-01-29: 51 mg via INTRAVENOUS
  Filled 2016-01-29: qty 51

## 2016-01-29 MED ORDER — HEPARIN SOD (PORK) LOCK FLUSH 100 UNIT/ML IV SOLN
500.0000 [IU] | Freq: Once | INTRAVENOUS | Status: AC
  Administered 2016-01-29: 500 [IU] via INTRAVENOUS

## 2016-01-29 MED ORDER — SODIUM CHLORIDE 0.9 % IV SOLN
Freq: Once | INTRAVENOUS | Status: AC
  Administered 2016-01-29: 11:00:00 via INTRAVENOUS
  Filled 2016-01-29: qty 5

## 2016-01-29 MED ORDER — SODIUM CHLORIDE 0.9 % IJ SOLN
10.0000 mL | Freq: Once | INTRAMUSCULAR | Status: AC
  Administered 2016-01-29: 10 mL via INTRAVENOUS
  Filled 2016-01-29: qty 10

## 2016-01-30 ENCOUNTER — Other Ambulatory Visit: Payer: Self-pay | Admitting: Hematology and Oncology

## 2016-01-30 ENCOUNTER — Inpatient Hospital Stay: Payer: BLUE CROSS/BLUE SHIELD

## 2016-01-30 VITALS — BP 116/74 | HR 70 | Temp 96.1°F | Resp 18

## 2016-01-30 DIAGNOSIS — C6212 Malignant neoplasm of descended left testis: Secondary | ICD-10-CM

## 2016-01-30 DIAGNOSIS — C6292 Malignant neoplasm of left testis, unspecified whether descended or undescended: Secondary | ICD-10-CM | POA: Diagnosis not present

## 2016-01-30 MED ORDER — SODIUM CHLORIDE 0.9 % IV SOLN
100.0000 mg/m2 | Freq: Once | INTRAVENOUS | Status: AC
  Administered 2016-01-30: 250 mg via INTRAVENOUS
  Filled 2016-01-30: qty 12.5

## 2016-01-30 MED ORDER — POTASSIUM CHLORIDE 2 MEQ/ML IV SOLN
Freq: Once | INTRAVENOUS | Status: AC
  Administered 2016-01-30: 09:00:00 via INTRAVENOUS
  Filled 2016-01-30: qty 10

## 2016-01-30 MED ORDER — SODIUM CHLORIDE 0.9 % IJ SOLN
10.0000 mL | Freq: Once | INTRAMUSCULAR | Status: AC
  Administered 2016-01-30: 10 mL via INTRAVENOUS
  Filled 2016-01-30: qty 10

## 2016-01-30 MED ORDER — DEXAMETHASONE SODIUM PHOSPHATE 10 MG/ML IJ SOLN
10.0000 mg | Freq: Once | INTRAMUSCULAR | Status: AC
  Administered 2016-01-30: 10 mg via INTRAVENOUS
  Filled 2016-01-30: qty 1

## 2016-01-30 MED ORDER — SODIUM CHLORIDE 0.9 % IV SOLN
Freq: Once | INTRAVENOUS | Status: AC
  Administered 2016-01-30: 09:00:00 via INTRAVENOUS
  Filled 2016-01-30: qty 1000

## 2016-01-30 MED ORDER — SODIUM CHLORIDE 0.9 % IV SOLN
20.0000 mg/m2 | Freq: Once | INTRAVENOUS | Status: AC
  Administered 2016-01-30: 51 mg via INTRAVENOUS
  Filled 2016-01-30: qty 51

## 2016-01-30 MED ORDER — HEPARIN SOD (PORK) LOCK FLUSH 100 UNIT/ML IV SOLN
500.0000 [IU] | Freq: Once | INTRAVENOUS | Status: AC
  Administered 2016-01-30: 500 [IU] via INTRAVENOUS
  Filled 2016-01-30: qty 5

## 2016-01-31 ENCOUNTER — Inpatient Hospital Stay: Payer: BLUE CROSS/BLUE SHIELD

## 2016-01-31 VITALS — BP 120/74 | HR 72 | Temp 97.1°F | Resp 18

## 2016-01-31 DIAGNOSIS — C6212 Malignant neoplasm of descended left testis: Secondary | ICD-10-CM

## 2016-01-31 DIAGNOSIS — C6292 Malignant neoplasm of left testis, unspecified whether descended or undescended: Secondary | ICD-10-CM | POA: Diagnosis not present

## 2016-01-31 MED ORDER — SODIUM CHLORIDE 0.9 % IV SOLN
Freq: Once | INTRAVENOUS | Status: AC
  Administered 2016-01-31: 09:00:00 via INTRAVENOUS
  Filled 2016-01-31: qty 1000

## 2016-01-31 MED ORDER — SODIUM CHLORIDE 0.9 % IV SOLN
Freq: Once | INTRAVENOUS | Status: AC
  Administered 2016-01-31: 11:00:00 via INTRAVENOUS
  Filled 2016-01-31: qty 5

## 2016-01-31 MED ORDER — DEXAMETHASONE SODIUM PHOSPHATE 10 MG/ML IJ SOLN: 10.0000 mg | Freq: Once | INTRAMUSCULAR | Status: DC

## 2016-01-31 MED ORDER — PEGFILGRASTIM 6 MG/0.6ML ~~LOC~~ PSKT
6.0000 mg | PREFILLED_SYRINGE | Freq: Once | SUBCUTANEOUS | Status: AC
  Administered 2016-01-31: 6 mg via SUBCUTANEOUS
  Filled 2016-01-31: qty 0.6

## 2016-01-31 MED ORDER — SODIUM CHLORIDE 0.9 % IV SOLN
100.0000 mg/m2 | Freq: Once | INTRAVENOUS | Status: AC
  Administered 2016-01-31: 250 mg via INTRAVENOUS
  Filled 2016-01-31: qty 12.5

## 2016-01-31 MED ORDER — SODIUM CHLORIDE 0.9 % IV SOLN
20.0000 mg/m2 | Freq: Once | INTRAVENOUS | Status: AC
  Administered 2016-01-31: 51 mg via INTRAVENOUS
  Filled 2016-01-31: qty 51

## 2016-01-31 MED ORDER — POTASSIUM CHLORIDE 2 MEQ/ML IV SOLN
Freq: Once | INTRAVENOUS | Status: AC
  Administered 2016-01-31: 09:00:00 via INTRAVENOUS
  Filled 2016-01-31: qty 10

## 2016-01-31 MED ORDER — SODIUM CHLORIDE 0.9% FLUSH
10.0000 mL | INTRAVENOUS | Status: DC | PRN
  Filled 2016-01-31: qty 10

## 2016-01-31 MED ORDER — HEPARIN SOD (PORK) LOCK FLUSH 100 UNIT/ML IV SOLN
500.0000 [IU] | Freq: Once | INTRAVENOUS | Status: DC | PRN
  Filled 2016-01-31: qty 5

## 2016-01-31 MED ORDER — PALONOSETRON HCL INJECTION 0.25 MG/5ML
0.2500 mg | Freq: Once | INTRAVENOUS | Status: AC
  Administered 2016-01-31: 0.25 mg via INTRAVENOUS
  Filled 2016-01-31: qty 5

## 2016-02-02 ENCOUNTER — Other Ambulatory Visit: Payer: Self-pay | Admitting: Hematology and Oncology

## 2016-02-02 NOTE — Progress Notes (Signed)
[facility] day:  02/03/2016  Chief Complaint: Edgar [name] is a 25 y.o. male with stage IIB testicular cancer who is seen for assessment prior to day 8 of cycle #2 BEP.  HPI: The patient was last seen in the medical oncology clinic on 01/27/2016.  At that time, he was doing well.  He began cycle #2 BEP.  He received OnPro Neulasta on 01/31/2016.  Symptomatically,  He has done well.  He notes a little aching in his hips post Neulasta.  He is taking Claritin.  He describes some bloating.  He is smoking 1/3 pack a day.   Past Medical History:  Diagnosis Date  . Asthma    AS A CHILD-NO INHALERS  . GERD (gastroesophageal reflux disease)    TUMS PRN    Past Surgical History:  Procedure Laterality Date  . ANKLE FRACTURE SURGERY Right 2004  . ORCHIECTOMY Left 11/26/2015   Procedure: ORCHIECTOMY;  Surgeon: [name], MD;  Location: [facility];  Service: Urology;  Laterality: Left;  radical/Inguinal approach  . PERIPHERAL VASCULAR CATHETERIZATION N/A 12/16/2015   Procedure: [name] Cath Insertion;  Surgeon: [name], MD;  Location: [facility];  Service: Cardiovascular;  Laterality: N/A;  . WISDOM TOOTH EXTRACTION  2017    Family History  Problem Relation Age of Onset  . Skin cancer Mother   . Breast cancer Maternal Grandmother   . Colon cancer Maternal Grandfather   . Diabetes Maternal Grandfather   . Prostate cancer Neg Hx   . Kidney cancer Neg Hx   . Bladder Cancer Neg Hx     Social History:  reports that he has been smoking Cigarettes.  He has a 4.00 pack-year smoking history. He has quit using smokeless tobacco. His smokeless tobacco use included Snuff. He reports that he uses drugs, including Marijuana. He reports that he does not drink alcohol.  He smokes 1/3 pack a day.  He smokes marijuana.  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  He has a 56 year-old-daughter named [name]. The  patient's mother's cell phone number is [phone].  His grandmother recently died.  Her funeral was last week.  He was a Corporate treasurer.  The patient is accompanied by his mother today.  Allergies: No Known Allergies  Current Medications: Current Outpatient Prescriptions  Medication Sig Dispense Refill  . acetaminophen (TYLENOL) 325 MG tablet Take 650 mg by mouth every 6 (six) hours as needed.    Marland Kitchen dexamethasone (DECADRON) 4 MG tablet Take 2 tablets by mouth once a day on the day starting on day 6 of chemotherapy and then take 2 tablets two times a day for 2 days. Take with food. 30 tablet 1  . docusate sodium (COLACE) 100 MG capsule Take 100 mg by mouth 2 (two) times daily.  0  . lidocaine-prilocaine (EMLA) cream Apply small amount of cream over the portacath site 1 hour before chemotherapy treatment, place saran wrap over the cream to protect your clothing 30 g 1  . LORazepam (ATIVAN) 0.5 MG tablet Take 1 tablet (0.5 mg total) by mouth every 6 (six) hours as needed (Nausea or vomiting). 30 tablet 0  . ondansetron (ZOFRAN) 8 MG tablet Take 1 tablet (8 mg total) by mouth 2 (two) times daily as needed. Start on day 8 of chemotherapy. 30 tablet 1   No current facility-administered medications for this visit.    Facility-Administered Medications Ordered  in Other Visits  Medication Dose Route Frequency Provider Last Rate Last Dose  . Cold Pack 1 packet  1 packet Topical Once PRN [name], MD        Review of Systems:  GENERAL:  Feels good.  No fevers or sweats.  Weight down 3 pounds. PERFORMANCE STATUS (ECOG):  0 HEENT:  No visual changes, runny nose, sore throat, mouth sores or tenderness. Lungs: No shortness of breath.  No cough.  No hemoptysis. Cardiac:  No chest pain, palpitations, orthopnea, or PND. GI:  No nausea, vomiting, diarrhea, constipation, melena or hematochezia. GU:  No urgency, frequency, dysuria, or hematuria. Musculoskeletal:  Hip ache s/p Neulasta.  No back  pain.  No joint pain.  No muscle tenderness. Extremities:  No pain or swelling. Skin:  No rashes or skin changes. Neuro:  No headache, numbness or weakness, balance or coordination issues. Endocrine:  No diabetes, thyroid issues, hot flashes or night sweats. Psych:  No mood changes, depression or anxiety. Pain:  No pain. Review of systems:  All other systems reviewed and found to be negative.  Physical Exam: 266 Blood pressure 138/88, pulse 80, temperature 98.6 F (37 C), temperature source Tympanic, resp. rate 18, weight 282 lb 10.1 oz (128.2 kg). GENERAL:  Well developed, well nourished, gentleman sitting comfortably in the exam room in no acute distress. MENTAL STATUS:  Alert and oriented to person, place and time. HEAD:  Alopecia totalis.  Normocephalic, atraumatic, face symmetric, no Cushingoid features. EYES:  Pupils equal round and reactive to light and accomodation.  No conjunctivitis or scleral icterus. ENT:  Oropharynx clear without lesion.  Tongue normal. Mucous membranes moist.  RESPIRATORY:  Clear to auscultation without rales, wheezes or rhonchi. CARDIOVASCULAR:  Regular rate and rhythm without murmur, rub or gallop. ABDOMEN:  Soft, non-tender, with active bowel sounds, and no appreciable hepatosplenomegaly.  No masses. SKIN:  No rashes, ulcers or lesions. EXTREMITIES: No edema, no skin discoloration or tenderness.  No palpable cords. LYMPH NODES: No palpable cervical, supraclavicular, axillary or inguinal adenopathy  NEUROLOGICAL: Unremarkable. PSYCH:  Appropriate.   Infusion on 02/03/2016  Component Date Value Ref Range Status  . WBC 02/03/2016 11.9* 3.8 - 10.6 K/uL Final  . RBC 02/03/2016 4.05* 4.40 - 5.90 MIL/uL Final  . Hemoglobin 02/03/2016 12.1* 13.0 - 18.0 g/dL Final  . HCT 02/03/2016 35.7* 40.0 - 52.0 % Final  . MCV 02/03/2016 88.3  80.0 - 100.0 fL Final  . MCH 02/03/2016 29.8  26.0 - 34.0 pg Final  . MCHC 02/03/2016 33.8  32.0 - 36.0 g/dL Final  . RDW  02/03/2016 14.6* 11.5 - 14.5 % Final  . Platelets 02/03/2016 301  150 - 440 K/uL Final  . Neutrophils Relative % 02/03/2016 85  % Final  . Neutro Abs 02/03/2016 10.1* 1.4 - 6.5 K/uL Final  . Lymphocytes Relative 02/03/2016 13  % Final  . Lymphs Abs 02/03/2016 1.6  1.0 - 3.6 K/uL Final  . Monocytes Relative 02/03/2016 1  % Final  . Monocytes Absolute 02/03/2016 0.1* 0.2 - 1.0 K/uL Final  . Eosinophils Relative 02/03/2016 0  % Final  . Eosinophils Absolute 02/03/2016 0.0  0 - 0.7 K/uL Final  . Basophils Relative 02/03/2016 1  % Final  . Basophils Absolute 02/03/2016 0.1  0 - 0.1 K/uL Final  . Sodium 02/03/2016 134* 135 - 145 mmol/L Final  . Potassium 02/03/2016 4.1  3.5 - 5.1 mmol/L Final  . Chloride 02/03/2016 101  101 - 111 mmol/L Final  .  CO2 02/03/2016 31  22 - 32 mmol/L Final  . Glucose, Bld 02/03/2016 99  65 - 99 mg/dL Final  . BUN 02/03/2016 26* 6 - 20 mg/dL Final  . Creatinine, Ser 02/03/2016 0.80  0.61 - 1.24 mg/dL Final  . Calcium 02/03/2016 9.4  8.9 - 10.3 mg/dL Final  . Total Protein 02/03/2016 6.7  6.5 - 8.1 g/dL Final  . Albumin 02/03/2016 4.1  3.5 - 5.0 g/dL Final  . AST 02/03/2016 14* 15 - 41 U/L Final  . ALT 02/03/2016 20  17 - 63 U/L Final  . Alkaline Phosphatase 02/03/2016 40  38 - 126 U/L Final  . Total Bilirubin 02/03/2016 0.5  0.3 - 1.2 mg/dL Final  . GFR calc non Af Amer 02/03/2016 >60  >60 mL/min Final  . GFR calc Af Amer 02/03/2016 >60  >60 mL/min Final   Comment: (NOTE) The eGFR has been calculated using the CKD EPI equation. This calculation has not been validated in all clinical situations. eGFR's persistently <60 mL/min signify possible Chronic Kidney Disease.   . Anion gap 02/03/2016 2* 5 - 15 Final  . Magnesium 02/03/2016 2.0  1.7 - 2.4 mg/dL Final    Assessment:  Edgar [name] is a 25 y.o. male with stage IIB left testicular cancer s/p left radical orchiectomy on 11/26/2015.  Pathology revealed a  10.7 cm mixed germ cell tumor (seminoma 50%,  embryonal 25%, yolk sac tumor 25%), limited to the testis, without lymphovascular invasion.  Clinical/pathologic stage is T1 N1-2 S0 M0.  Pre surgery labs on 11/20/2015 revealed an AFP of 411.9, beta-HCG 2,669, and LDH 522 (121-224).  Labs on 12/19/2015 revealed an AFP of 11.2 (0-8.3), beta-HCG of 2 (0-3), and LDH 179 (121-224).  Labs on 12/23/2015 revealed an AFP of 6.3, beta-HCG of 1, and LDH 160.  Labs on 01/27/2016 revealed an AFP of 1.3, beta-HCG of < 1, and LDH 202.  Chest, abdomen, and pelvic CT scan on 11/28/2015 revealed no mediastinal adenopathy or suspicious pulmonary nodules.   There were 2 prominent left periaortic retroperitoneal lymph nodes (1.7 cm and 0.9 cm) concerning for testicular nodal metastasis.  There were no abnormal lymph nodes above the renal veins.  There were postsurgical change in the left hemiscrotum and left inguinal canal.  There was a 3.3 cm soft tissue abnormality anterior to the left iliopsoas muscle which could represent a metastatic lymph node (N2 lesion).  Head MRI on 12/17/2015 revealed no evidence of metastatic disease.    Pulmonary function testing revealed a FVC 4.71 liters (76% predicted), FEV1 4.18 liters (88% predicted), TLC 7.23 liters (87% predicted), VC 4.88 (79% predicted) and DLCO of 39.3 mL/mmHg/min (100% predicted).  He is undecided about sperm banking.  He declined evaluation for retroperitoneal lymph node dissection (RPLND).  He declined sperm banking.  He is currently day 8 of cycle #2 BEP (12/30/2015 - 01/27/2016).  ANC was 200 on day 18 of cycle #1.  He received OnPro Neulasta with cycle #2.  He has had mild hip aching post Neulasta.  Symptomatically, he is doing well.  Exam is normal.  Counts are good.  Plan: 1.  Labs today:  CBC with diff, CMP, Mg. 2.  Day 8 bleomycin today. 3.  Encourage smoking cessation. 4.  RTC in 1 week for MD assessment, labs (CBC with diff, CMP, Mg), and day 15 bleomycin.   [name], MD  02/03/2016,  11:41 AM

## 2016-02-03 ENCOUNTER — Inpatient Hospital Stay: Payer: BLUE CROSS/BLUE SHIELD

## 2016-02-03 ENCOUNTER — Encounter: Payer: Self-pay | Admitting: Hematology and Oncology

## 2016-02-03 ENCOUNTER — Inpatient Hospital Stay (HOSPITAL_BASED_OUTPATIENT_CLINIC_OR_DEPARTMENT_OTHER): Payer: BLUE CROSS/BLUE SHIELD | Admitting: Hematology and Oncology

## 2016-02-03 VITALS — BP 138/88 | HR 80 | Temp 98.6°F | Resp 18 | Wt 282.6 lb

## 2016-02-03 VITALS — BP 117/81 | HR 72 | Resp 18

## 2016-02-03 DIAGNOSIS — M25552 Pain in left hip: Secondary | ICD-10-CM | POA: Diagnosis not present

## 2016-02-03 DIAGNOSIS — Z800 Family history of malignant neoplasm of digestive organs: Secondary | ICD-10-CM

## 2016-02-03 DIAGNOSIS — Z634 Disappearance and death of family member: Secondary | ICD-10-CM

## 2016-02-03 DIAGNOSIS — M25551 Pain in right hip: Secondary | ICD-10-CM | POA: Diagnosis not present

## 2016-02-03 DIAGNOSIS — Z5111 Encounter for antineoplastic chemotherapy: Secondary | ICD-10-CM

## 2016-02-03 DIAGNOSIS — Z803 Family history of malignant neoplasm of breast: Secondary | ICD-10-CM

## 2016-02-03 DIAGNOSIS — Z7689 Persons encountering health services in other specified circumstances: Secondary | ICD-10-CM | POA: Diagnosis not present

## 2016-02-03 DIAGNOSIS — Z79899 Other long term (current) drug therapy: Secondary | ICD-10-CM

## 2016-02-03 DIAGNOSIS — K219 Gastro-esophageal reflux disease without esophagitis: Secondary | ICD-10-CM

## 2016-02-03 DIAGNOSIS — Z808 Family history of malignant neoplasm of other organs or systems: Secondary | ICD-10-CM

## 2016-02-03 DIAGNOSIS — C6212 Malignant neoplasm of descended left testis: Secondary | ICD-10-CM

## 2016-02-03 DIAGNOSIS — R140 Abdominal distension (gaseous): Secondary | ICD-10-CM

## 2016-02-03 DIAGNOSIS — F17210 Nicotine dependence, cigarettes, uncomplicated: Secondary | ICD-10-CM

## 2016-02-03 DIAGNOSIS — J45909 Unspecified asthma, uncomplicated: Secondary | ICD-10-CM

## 2016-02-03 DIAGNOSIS — R531 Weakness: Secondary | ICD-10-CM

## 2016-02-03 DIAGNOSIS — C6292 Malignant neoplasm of left testis, unspecified whether descended or undescended: Secondary | ICD-10-CM | POA: Diagnosis not present

## 2016-02-03 DIAGNOSIS — Z9889 Other specified postprocedural states: Secondary | ICD-10-CM

## 2016-02-03 LAB — CBC WITH DIFFERENTIAL/PLATELET
Basophils Absolute: 0.1 10*3/uL (ref 0–0.1)
Basophils Relative: 1 %
Eosinophils Absolute: 0 10*3/uL (ref 0–0.7)
Eosinophils Relative: 0 %
HCT: 35.7 % — ABNORMAL LOW (ref 40.0–52.0)
Hemoglobin: 12.1 g/dL — ABNORMAL LOW (ref 13.0–18.0)
Lymphocytes Relative: 13 %
Lymphs Abs: 1.6 10*3/uL (ref 1.0–3.6)
MCH: 29.8 pg (ref 26.0–34.0)
MCHC: 33.8 g/dL (ref 32.0–36.0)
MCV: 88.3 fL (ref 80.0–100.0)
Monocytes Absolute: 0.1 10*3/uL — ABNORMAL LOW (ref 0.2–1.0)
Monocytes Relative: 1 %
Neutro Abs: 10.1 10*3/uL — ABNORMAL HIGH (ref 1.4–6.5)
Neutrophils Relative %: 85 %
Platelets: 301 10*3/uL (ref 150–440)
RBC: 4.05 MIL/uL — ABNORMAL LOW (ref 4.40–5.90)
RDW: 14.6 % — ABNORMAL HIGH (ref 11.5–14.5)
WBC: 11.9 10*3/uL — ABNORMAL HIGH (ref 3.8–10.6)

## 2016-02-03 LAB — MAGNESIUM: Magnesium: 2 mg/dL (ref 1.7–2.4)

## 2016-02-03 MED ORDER — SODIUM CHLORIDE 0.9 % IV SOLN
30.0000 [IU] | Freq: Once | INTRAVENOUS | Status: AC
  Administered 2016-02-03: 30 [IU] via INTRAVENOUS
  Filled 2016-02-03: qty 10

## 2016-02-03 MED ORDER — HEPARIN SOD (PORK) LOCK FLUSH 100 UNIT/ML IV SOLN
500.0000 [IU] | Freq: Once | INTRAVENOUS | Status: AC
  Administered 2016-02-03: 500 [IU] via INTRAVENOUS
  Filled 2016-02-03: qty 5

## 2016-02-03 MED ORDER — SODIUM CHLORIDE 0.9 % IV SOLN
Freq: Once | INTRAVENOUS | Status: AC
  Administered 2016-02-03: 10:00:00 via INTRAVENOUS
  Filled 2016-02-03: qty 1000

## 2016-02-03 MED ORDER — ONDANSETRON 8 MG PO TBDP
8.0000 mg | ORAL_TABLET | Freq: Once | ORAL | Status: AC
  Administered 2016-02-03: 8 mg via ORAL
  Filled 2016-02-03: qty 1

## 2016-02-03 MED ORDER — SODIUM CHLORIDE 0.9 % IJ SOLN
10.0000 mL | Freq: Once | INTRAMUSCULAR | Status: AC
  Administered 2016-02-03: 10 mL via INTRAVENOUS
  Filled 2016-02-03: qty 10

## 2016-02-03 MED ORDER — COLD PACK MISC ONCOLOGY
1.0000 | Freq: Once | Status: DC | PRN
  Filled 2016-02-03: qty 1

## 2016-02-03 NOTE — Progress Notes (Signed)
Patient states since yesterday he has felt bloated. Patient has had a 3# weight gain since last visit.

## 2016-02-04 LAB — COMPREHENSIVE METABOLIC PANEL
ALT: 20 U/L (ref 17–63)
AST: 14 U/L — ABNORMAL LOW (ref 15–41)
Albumin: 4.1 g/dL (ref 3.5–5.0)
Alkaline Phosphatase: 40 U/L (ref 38–126)
Anion gap: 2 — ABNORMAL LOW (ref 5–15)
BUN: 26 mg/dL — ABNORMAL HIGH (ref 6–20)
CO2: 31 mmol/L (ref 22–32)
Calcium: 9.4 mg/dL (ref 8.9–10.3)
Chloride: 101 mmol/L (ref 101–111)
Creatinine, Ser: 0.8 mg/dL (ref 0.61–1.24)
GFR calc Af Amer: >60 mL/min (ref >60)
GFR calc non Af Amer: >60 mL/min (ref >60)
Glucose, Bld: 99 mg/dL (ref 65–99)
Potassium: 4.1 mmol/L (ref 3.5–5.1)
Sodium: 134 mmol/L — ABNORMAL LOW (ref 135–145)
Total Bilirubin: 0.5 mg/dL (ref 0.3–1.2)
Total Protein: 6.7 g/dL (ref 6.5–8.1)

## 2016-02-09 ENCOUNTER — Other Ambulatory Visit: Payer: Self-pay | Admitting: Hematology and Oncology

## 2016-02-09 NOTE — Progress Notes (Addendum)
[facility] day:  02/10/2016  Chief Complaint: Edgar [name] is a 25 y.o. male with stage IIB testicular cancer who is seen for assessment prior to day 15 of cycle #2 BEP.  HPI: The patient was last seen in the medical oncology clinic on 02/03/2016.  At that time, he was doing well.  He received day 8  bleomycin.  He described a little hip aching s/p Neulasta.  He was taking Claritin.  Symptomatically, he states that he felt "bad" last week. His muscles were achy.  He took Claritin for 10 days.  He did not smoke much last week.  He has a rash across his chest.  He feels good today.   Past Medical History:  Diagnosis Date  . Asthma    AS A CHILD-NO INHALERS  . GERD (gastroesophageal reflux disease)    TUMS PRN    Past Surgical History:  Procedure Laterality Date  . ANKLE FRACTURE SURGERY Right 2004  . ORCHIECTOMY Left 11/26/2015   Procedure: ORCHIECTOMY;  Surgeon: [name], MD;  Location: [facility];  Service: Urology;  Laterality: Left;  radical/Inguinal approach  . PERIPHERAL VASCULAR CATHETERIZATION N/A 12/16/2015   Procedure: [name] Cath Insertion;  Surgeon: [name], MD;  Location: [facility];  Service: Cardiovascular;  Laterality: N/A;  . WISDOM TOOTH EXTRACTION  2017    Family History  Problem Relation Age of Onset  . Skin cancer Mother   . Breast cancer Maternal Grandmother   . Colon cancer Maternal Grandfather   . Diabetes Maternal Grandfather   . Prostate cancer Neg Hx   . Kidney cancer Neg Hx   . Bladder Cancer Neg Hx     Social History:  reports that he has been smoking Cigarettes.  He has a 4.00 pack-year smoking history. He has quit using smokeless tobacco. His smokeless tobacco use included Snuff. He reports that he uses drugs, including Marijuana. He reports that he does not drink alcohol.  He smokes 1/3 pack a day.  He smokes marijuana.  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  He has a 5 year-old-daughter named [name]. The patient's mother's cell phone number is [phone].  His grandmother recently died.  Her funeral was last week.  He was a Corporate treasurer.  The patient is accompanied by his mother today.  Allergies: No Known Allergies  Current Medications: Current Outpatient Prescriptions  Medication Sig Dispense Refill  . acetaminophen (TYLENOL) 325 MG tablet Take 650 mg by mouth every 6 (six) hours as needed.    Marland Kitchen dexamethasone (DECADRON) 4 MG tablet Take 2 tablets by mouth once a day on the day starting on day 6 of chemotherapy and then take 2 tablets two times a day for 2 days. Take with food. 30 tablet 1  . docusate sodium (COLACE) 100 MG capsule Take 100 mg by mouth 2 (two) times daily.  0  . lidocaine-prilocaine (EMLA) cream Apply small amount of cream over the portacath site 1 hour before chemotherapy treatment, place saran wrap over the cream to protect your clothing 30 g 1  . LORazepam (ATIVAN) 0.5 MG tablet Take 1 tablet (0.5 mg total) by mouth every 6 (six) hours as needed (Nausea or vomiting). 30 tablet 0  . ondansetron (ZOFRAN) 8 MG tablet Take 1 tablet (8 mg total) by mouth 2 (two) times daily as needed. Start on day 8 of chemotherapy. 30 tablet 1  No current facility-administered medications for this visit.    Facility-Administered Medications Ordered in Other Visits  Medication Dose Route Frequency Provider Last Rate Last Dose  . heparin lock flush 100 unit/mL  500 Units Intracatheter Once PRN [name], MD      . sodium chloride flush (NS) 0.9 % injection 10 mL  10 mL Intracatheter PRN [name], MD   10 mL at 02/10/16 1049    Review of Systems:  GENERAL:  Feels good.  No fevers or sweats.  Weight up 4 pounds. PERFORMANCE STATUS (ECOG):  0 HEENT:  No visual changes, runny nose, sore throat, mouth sores or tenderness. Lungs: No shortness of breath.  No cough.  No hemoptysis. Cardiac:  No chest pain,  palpitations, orthopnea, or PND. GI:  No nausea, vomiting, diarrhea, constipation, melena or hematochezia. GU:  No urgency, frequency, dysuria, or hematuria. Musculoskeletal: Achy last week s/p Neulasta.  No back pain.  No joint pain.  No muscle tenderness. Extremities:  No pain or swelling. Skin:  Chest rash. Neuro:  No headache, numbness or weakness, balance or coordination issues. Endocrine:  No diabetes, thyroid issues, hot flashes or night sweats. Psych:  No mood changes, depression or anxiety. Pain:  No pain. Review of systems:  All other systems reviewed and found to be negative.  Physical Exam: 266 Blood pressure 130/88, pulse 88, temperature (!) 96.6 F (35.9 C), temperature source Tympanic, resp. rate 18, weight 286 lb 6 oz (129.9 kg). GENERAL:  Well developed, well nourished, gentleman sitting comfortably in the exam room in no acute distress. MENTAL STATUS:  Alert and oriented to person, place and time. HEAD:  Alopecia totalis.  Normocephalic, atraumatic, face symmetric, no Cushingoid features. EYES:  Pupils equal round and reactive to light and accomodation.  No conjunctivitis or scleral icterus. ENT:  Small right upper lip cold sore.  Oropharynx clear without lesion.  Tongue normal. Mucous membranes moist.  RESPIRATORY:  Clear to auscultation without rales, wheezes or rhonchi. CARDIOVASCULAR:  Regular rate and rhythm without murmur, rub or gallop. ABDOMEN:  Soft, non-tender, with active bowel sounds, and no appreciable hepatosplenomegaly.  No masses. SKIN:  Scattered acne across chest and back. EXTREMITIES: No edema, no skin discoloration or tenderness.  No palpable cords. LYMPH NODES: No palpable cervical, supraclavicular, axillary or inguinal adenopathy  NEUROLOGICAL: Unremarkable. PSYCH:  Appropriate.   Infusion on 02/10/2016  Component Date Value Ref Range Status  . WBC 02/10/2016 4.1  3.8 - 10.6 K/uL Final  . RBC 02/10/2016 3.83* 4.40 - 5.90 MIL/uL Final  .  Hemoglobin 02/10/2016 11.5* 13.0 - 18.0 g/dL Final  . HCT 02/10/2016 34.2* 40.0 - 52.0 % Final  . MCV 02/10/2016 89.3  80.0 - 100.0 fL Final  . MCH 02/10/2016 30.0  26.0 - 34.0 pg Final  . MCHC 02/10/2016 33.6  32.0 - 36.0 g/dL Final  . RDW 02/10/2016 15.3* 11.5 - 14.5 % Final  . Platelets 02/10/2016 107* 150 - 440 K/uL Final  . Neutrophils Relative % 02/10/2016 63  % Final  . Neutro Abs 02/10/2016 2.6  1.4 - 6.5 K/uL Final  . Lymphocytes Relative 02/10/2016 26  % Final  . Lymphs Abs 02/10/2016 1.0  1.0 - 3.6 K/uL Final  . Monocytes Relative 02/10/2016 9  % Final  . Monocytes Absolute 02/10/2016 0.4  0.2 - 1.0 K/uL Final  . Eosinophils Relative 02/10/2016 1  % Final  . Eosinophils Absolute 02/10/2016 0.0  0 - 0.7 K/uL Final  . Basophils Relative  02/10/2016 1  % Final  . Basophils Absolute 02/10/2016 0.0  0 - 0.1 K/uL Final  . Sodium 02/10/2016 137  135 - 145 mmol/L Final  . Potassium 02/10/2016 4.3  3.5 - 5.1 mmol/L Final  . Chloride 02/10/2016 104  101 - 111 mmol/L Final  . CO2 02/10/2016 24  22 - 32 mmol/L Final  . Glucose, Bld 02/10/2016 95  65 - 99 mg/dL Final  . BUN 02/10/2016 20  6 - 20 mg/dL Final  . Creatinine, Ser 02/10/2016 0.77  0.61 - 1.24 mg/dL Final  . Calcium 02/10/2016 8.9  8.9 - 10.3 mg/dL Final  . Total Protein 02/10/2016 6.8  6.5 - 8.1 g/dL Final  . Albumin 02/10/2016 4.1  3.5 - 5.0 g/dL Final  . AST 02/10/2016 17  15 - 41 U/L Final  . ALT 02/10/2016 22  17 - 63 U/L Final  . Alkaline Phosphatase 02/10/2016 39  38 - 126 U/L Final  . Total Bilirubin 02/10/2016 0.5  0.3 - 1.2 mg/dL Final  . GFR calc non Af Amer 02/10/2016 >60  >60 mL/min Final  . GFR calc Af Amer 02/10/2016 >60  >60 mL/min Final   Comment: (NOTE) The eGFR has been calculated using the CKD EPI equation. This calculation has not been validated in all clinical situations. eGFR's persistently <60 mL/min signify possible Chronic Kidney Disease.   . Anion gap 02/10/2016 9  5 - 15 Final  . Magnesium  02/10/2016 1.9  1.7 - 2.4 mg/dL Final  . LDH 02/10/2016 156  98 - 192 U/L Final    Assessment:  Edgar [name] is a 25 y.o. male with stage IIB left testicular cancer s/p left radical orchiectomy on 11/26/2015.  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  Clinical/pathologic stage is T1 N1-2 S0 M0.  Pre surgery labs on 11/20/2015 revealed an AFP of 411.9, beta-HCG 2,669, and LDH 522 (121-224).    Tumor markers have been followed:  AFP was 411.9 (0-8.3) on 11/20/2015, 11.2 on 12/19/2015, 6.3 on 12/23/2015, and 1.3 on 01/27/2016.  Beta-HCG was 2,669 (0-3) on 11/20/2015, 2 on 12/19/2015, 1 on 12/23/2015, and < 1 on 01/27/2016.  LDH was 522 (98-192) on 11/20/2015, 179 on 12/19/2015, 160 on 12/23/2015, 202 on 01/27/2016, and 156 on 02/10/2016.  Chest, abdomen, and pelvic CT scan on 11/28/2015 revealed no mediastinal adenopathy or suspicious pulmonary nodules.   There were 2 prominent left periaortic retroperitoneal lymph nodes (1.7 cm and 0.9 cm) concerning for testicular nodal metastasis.  There were no abnormal lymph nodes above the renal veins.  There were postsurgical change in the left hemiscrotum and left inguinal canal.  There was a 3.3 cm soft tissue abnormality anterior to the left iliopsoas muscle which could represent a metastatic lymph node (N2 lesion).  Head MRI on 12/17/2015 revealed no evidence of metastatic disease.    Pulmonary function testing revealed a FVC 4.71 liters (76% predicted), FEV1 4.18 liters (88% predicted), TLC 7.23 liters (87% predicted), VC 4.88 (79% predicted) and DLCO of 39.3 mL/mmHg/min (100% predicted).  He is undecided about sperm banking.  He declined evaluation for retroperitoneal lymph node dissection (RPLND).  He declined sperm banking.  He is currently day 15 of cycle #2 BEP (12/30/2015 - 01/27/2016).  ANC was 200 on day 18 of cycle #1.  He  received OnPro Neulasta with cycle #2.  He felt achy post Neulasta.  Symptomatically, he is doing well.  He has mild acne on his back and chest.  Platelet count is 107,000.  Plan: 1.  Labs today:  CBC with diff, CMP, Mg. 2.  Day 15 bleomycin today. 3.  Encourage smoking cessation. 4.  RTC in 1 week for MD assessment, labs (CBC with diff, CMP, Mg, AFP, beta-HCG, LDH), and day 1 of cycle #3 BEP.   [name], MD  02/10/2016, 11:28 AM

## 2016-02-10 ENCOUNTER — Inpatient Hospital Stay (HOSPITAL_BASED_OUTPATIENT_CLINIC_OR_DEPARTMENT_OTHER): Payer: BLUE CROSS/BLUE SHIELD | Admitting: Hematology and Oncology

## 2016-02-10 ENCOUNTER — Inpatient Hospital Stay: Payer: BLUE CROSS/BLUE SHIELD

## 2016-02-10 ENCOUNTER — Encounter: Payer: Self-pay | Admitting: Hematology and Oncology

## 2016-02-10 VITALS — BP 130/88 | HR 88 | Temp 96.6°F | Resp 18 | Wt 286.4 lb

## 2016-02-10 DIAGNOSIS — M25552 Pain in left hip: Secondary | ICD-10-CM | POA: Diagnosis not present

## 2016-02-10 DIAGNOSIS — Z5111 Encounter for antineoplastic chemotherapy: Secondary | ICD-10-CM

## 2016-02-10 DIAGNOSIS — J45909 Unspecified asthma, uncomplicated: Secondary | ICD-10-CM

## 2016-02-10 DIAGNOSIS — C6212 Malignant neoplasm of descended left testis: Secondary | ICD-10-CM | POA: Diagnosis not present

## 2016-02-10 DIAGNOSIS — Z800 Family history of malignant neoplasm of digestive organs: Secondary | ICD-10-CM

## 2016-02-10 DIAGNOSIS — F17210 Nicotine dependence, cigarettes, uncomplicated: Secondary | ICD-10-CM

## 2016-02-10 DIAGNOSIS — Z808 Family history of malignant neoplasm of other organs or systems: Secondary | ICD-10-CM

## 2016-02-10 DIAGNOSIS — R21 Rash and other nonspecific skin eruption: Secondary | ICD-10-CM | POA: Diagnosis not present

## 2016-02-10 DIAGNOSIS — Z634 Disappearance and death of family member: Secondary | ICD-10-CM

## 2016-02-10 DIAGNOSIS — R531 Weakness: Secondary | ICD-10-CM

## 2016-02-10 DIAGNOSIS — C6292 Malignant neoplasm of left testis, unspecified whether descended or undescended: Secondary | ICD-10-CM | POA: Diagnosis not present

## 2016-02-10 DIAGNOSIS — Z7689 Persons encountering health services in other specified circumstances: Secondary | ICD-10-CM

## 2016-02-10 DIAGNOSIS — K219 Gastro-esophageal reflux disease without esophagitis: Secondary | ICD-10-CM

## 2016-02-10 DIAGNOSIS — M25551 Pain in right hip: Secondary | ICD-10-CM

## 2016-02-10 DIAGNOSIS — Z9889 Other specified postprocedural states: Secondary | ICD-10-CM

## 2016-02-10 DIAGNOSIS — Z803 Family history of malignant neoplasm of breast: Secondary | ICD-10-CM

## 2016-02-10 DIAGNOSIS — Z79899 Other long term (current) drug therapy: Secondary | ICD-10-CM

## 2016-02-10 DIAGNOSIS — R140 Abdominal distension (gaseous): Secondary | ICD-10-CM

## 2016-02-10 LAB — CBC WITH DIFFERENTIAL/PLATELET
Basophils Absolute: 0 10*3/uL (ref 0–0.1)
Basophils Relative: 1 %
Eosinophils Absolute: 0 10*3/uL (ref 0–0.7)
Eosinophils Relative: 1 %
HCT: 34.2 % — ABNORMAL LOW (ref 40.0–52.0)
Hemoglobin: 11.5 g/dL — ABNORMAL LOW (ref 13.0–18.0)
Lymphocytes Relative: 26 %
Lymphs Abs: 1 10*3/uL (ref 1.0–3.6)
MCH: 30 pg (ref 26.0–34.0)
MCHC: 33.6 g/dL (ref 32.0–36.0)
MCV: 89.3 fL (ref 80.0–100.0)
Monocytes Absolute: 0.4 10*3/uL (ref 0.2–1.0)
Monocytes Relative: 9 %
Neutro Abs: 2.6 10*3/uL (ref 1.4–6.5)
Neutrophils Relative %: 63 %
Platelets: 107 10*3/uL — ABNORMAL LOW (ref 150–440)
RBC: 3.83 MIL/uL — ABNORMAL LOW (ref 4.40–5.90)
RDW: 15.3 % — ABNORMAL HIGH (ref 11.5–14.5)
WBC: 4.1 10*3/uL (ref 3.8–10.6)

## 2016-02-10 LAB — COMPREHENSIVE METABOLIC PANEL
ALT: 22 U/L (ref 17–63)
AST: 17 U/L (ref 15–41)
Albumin: 4.1 g/dL (ref 3.5–5.0)
Alkaline Phosphatase: 39 U/L (ref 38–126)
Anion gap: 9 (ref 5–15)
BUN: 20 mg/dL (ref 6–20)
CO2: 24 mmol/L (ref 22–32)
Calcium: 8.9 mg/dL (ref 8.9–10.3)
Chloride: 104 mmol/L (ref 101–111)
Creatinine, Ser: 0.77 mg/dL (ref 0.61–1.24)
GFR calc Af Amer: >60 mL/min (ref >60)
GFR calc non Af Amer: >60 mL/min (ref >60)
Glucose, Bld: 95 mg/dL (ref 65–99)
Potassium: 4.3 mmol/L (ref 3.5–5.1)
Sodium: 137 mmol/L (ref 135–145)
Total Bilirubin: 0.5 mg/dL (ref 0.3–1.2)
Total Protein: 6.8 g/dL (ref 6.5–8.1)

## 2016-02-10 LAB — LACTATE DEHYDROGENASE: LDH: 156 U/L (ref 98–192)

## 2016-02-10 LAB — MAGNESIUM: Magnesium: 1.9 mg/dL (ref 1.7–2.4)

## 2016-02-10 MED ORDER — SODIUM CHLORIDE 0.9% FLUSH
10.0000 mL | INTRAVENOUS | Status: DC | PRN
  Administered 2016-02-10: 10 mL
  Filled 2016-02-10: qty 10

## 2016-02-10 MED ORDER — HEPARIN SOD (PORK) LOCK FLUSH 100 UNIT/ML IV SOLN
500.0000 [IU] | Freq: Once | INTRAVENOUS | Status: AC | PRN
  Administered 2016-02-10: 500 [IU]
  Filled 2016-02-10: qty 5

## 2016-02-10 MED ORDER — ONDANSETRON 8 MG PO TBDP
8.0000 mg | ORAL_TABLET | Freq: Once | ORAL | Status: AC
  Administered 2016-02-10: 8 mg via ORAL
  Filled 2016-02-10: qty 1

## 2016-02-10 MED ORDER — SODIUM CHLORIDE 0.9 % IV SOLN
Freq: Once | INTRAVENOUS | Status: AC
  Administered 2016-02-10: 12:00:00 via INTRAVENOUS
  Filled 2016-02-10: qty 1000

## 2016-02-10 MED ORDER — SODIUM CHLORIDE 0.9 % IV SOLN
30.0000 [IU] | Freq: Once | INTRAVENOUS | Status: AC
  Administered 2016-02-10: 30 [IU] via INTRAVENOUS
  Filled 2016-02-10: qty 10

## 2016-02-10 NOTE — Telephone Encounter (Signed)
error 

## 2016-02-10 NOTE — Progress Notes (Signed)
Patient states he has had some nausea.  Also has a rash across his upper chest.

## 2016-02-17 ENCOUNTER — Inpatient Hospital Stay: Payer: BLUE CROSS/BLUE SHIELD

## 2016-02-17 ENCOUNTER — Inpatient Hospital Stay (HOSPITAL_BASED_OUTPATIENT_CLINIC_OR_DEPARTMENT_OTHER): Payer: BLUE CROSS/BLUE SHIELD | Admitting: Hematology and Oncology

## 2016-02-17 ENCOUNTER — Inpatient Hospital Stay: Payer: BLUE CROSS/BLUE SHIELD | Attending: Hematology and Oncology

## 2016-02-17 VITALS — BP 138/98 | HR 92 | Temp 97.7°F | Resp 18 | Ht 75.0 in | Wt 275.6 lb

## 2016-02-17 DIAGNOSIS — Z808 Family history of malignant neoplasm of other organs or systems: Secondary | ICD-10-CM | POA: Diagnosis not present

## 2016-02-17 DIAGNOSIS — Z7689 Persons encountering health services in other specified circumstances: Secondary | ICD-10-CM

## 2016-02-17 DIAGNOSIS — R634 Abnormal weight loss: Secondary | ICD-10-CM

## 2016-02-17 DIAGNOSIS — Z5111 Encounter for antineoplastic chemotherapy: Secondary | ICD-10-CM | POA: Insufficient documentation

## 2016-02-17 DIAGNOSIS — Z9079 Acquired absence of other genital organ(s): Secondary | ICD-10-CM

## 2016-02-17 DIAGNOSIS — F17210 Nicotine dependence, cigarettes, uncomplicated: Secondary | ICD-10-CM

## 2016-02-17 DIAGNOSIS — R599 Enlarged lymph nodes, unspecified: Secondary | ICD-10-CM | POA: Insufficient documentation

## 2016-02-17 DIAGNOSIS — Z79899 Other long term (current) drug therapy: Secondary | ICD-10-CM | POA: Insufficient documentation

## 2016-02-17 DIAGNOSIS — D72829 Elevated white blood cell count, unspecified: Secondary | ICD-10-CM | POA: Diagnosis not present

## 2016-02-17 DIAGNOSIS — D709 Neutropenia, unspecified: Secondary | ICD-10-CM

## 2016-02-17 DIAGNOSIS — Z95828 Presence of other vascular implants and grafts: Secondary | ICD-10-CM

## 2016-02-17 DIAGNOSIS — L709 Acne, unspecified: Secondary | ICD-10-CM | POA: Insufficient documentation

## 2016-02-17 DIAGNOSIS — R110 Nausea: Secondary | ICD-10-CM

## 2016-02-17 DIAGNOSIS — Z8781 Personal history of (healed) traumatic fracture: Secondary | ICD-10-CM | POA: Diagnosis not present

## 2016-02-17 DIAGNOSIS — R000 Tachycardia, unspecified: Secondary | ICD-10-CM | POA: Insufficient documentation

## 2016-02-17 DIAGNOSIS — C6212 Malignant neoplasm of descended left testis: Secondary | ICD-10-CM | POA: Diagnosis not present

## 2016-02-17 DIAGNOSIS — K219 Gastro-esophageal reflux disease without esophagitis: Secondary | ICD-10-CM | POA: Diagnosis not present

## 2016-02-17 LAB — COMPREHENSIVE METABOLIC PANEL
ALT: 22 U/L (ref 17–63)
AST: 17 U/L (ref 15–41)
Albumin: 4.4 g/dL (ref 3.5–5.0)
Alkaline Phosphatase: 45 U/L (ref 38–126)
Anion gap: 9 (ref 5–15)
BUN: 10 mg/dL (ref 6–20)
CO2: 27 mmol/L (ref 22–32)
Calcium: 9.4 mg/dL (ref 8.9–10.3)
Chloride: 106 mmol/L (ref 101–111)
Creatinine, Ser: 0.81 mg/dL (ref 0.61–1.24)
GFR calc Af Amer: >60 mL/min (ref >60)
GFR calc non Af Amer: >60 mL/min (ref >60)
Glucose, Bld: 117 mg/dL — ABNORMAL HIGH (ref 65–99)
Potassium: 3.7 mmol/L (ref 3.5–5.1)
Sodium: 142 mmol/L (ref 135–145)
Total Bilirubin: 0.4 mg/dL (ref 0.3–1.2)
Total Protein: 7.3 g/dL (ref 6.5–8.1)

## 2016-02-17 LAB — CBC WITH DIFFERENTIAL/PLATELET
Basophils Absolute: 0 10*3/uL (ref 0–0.1)
Basophils Relative: 1 %
Eosinophils Absolute: 0 10*3/uL (ref 0–0.7)
Eosinophils Relative: 1 %
HCT: 37.3 % — ABNORMAL LOW (ref 40.0–52.0)
Hemoglobin: 12.6 g/dL — ABNORMAL LOW (ref 13.0–18.0)
Lymphocytes Relative: 38 %
Lymphs Abs: 1 10*3/uL (ref 1.0–3.6)
MCH: 30.1 pg (ref 26.0–34.0)
MCHC: 33.6 g/dL (ref 32.0–36.0)
MCV: 89.4 fL (ref 80.0–100.0)
Monocytes Absolute: 0.7 10*3/uL (ref 0.2–1.0)
Monocytes Relative: 26 %
Neutro Abs: 0.9 10*3/uL — ABNORMAL LOW (ref 1.4–6.5)
Neutrophils Relative %: 34 %
Platelets: 630 10*3/uL — ABNORMAL HIGH (ref 150–440)
RBC: 4.18 MIL/uL — ABNORMAL LOW (ref 4.40–5.90)
RDW: 16.8 % — ABNORMAL HIGH (ref 11.5–14.5)
WBC: 2.6 10*3/uL — ABNORMAL LOW (ref 3.8–10.6)

## 2016-02-17 LAB — MAGNESIUM: Magnesium: 2 mg/dL (ref 1.7–2.4)

## 2016-02-17 LAB — LACTATE DEHYDROGENASE: LDH: 161 U/L (ref 98–192)

## 2016-02-17 MED ORDER — SODIUM CHLORIDE 0.9% FLUSH
10.0000 mL | INTRAVENOUS | Status: DC | PRN
  Administered 2016-02-17: 10 mL via INTRAVENOUS
  Filled 2016-02-17: qty 10

## 2016-02-17 MED ORDER — HEPARIN SOD (PORK) LOCK FLUSH 100 UNIT/ML IV SOLN
INTRAVENOUS | Status: AC
  Filled 2016-02-17: qty 5

## 2016-02-17 MED ORDER — HEPARIN SOD (PORK) LOCK FLUSH 100 UNIT/ML IV SOLN
500.0000 [IU] | Freq: Once | INTRAVENOUS | Status: AC
  Administered 2016-02-17: 500 [IU] via INTRAVENOUS

## 2016-02-17 NOTE — Progress Notes (Signed)
Pt was nauseated this weekend and took nausea med and it got better and he  Was able to eat. No other complaints

## 2016-02-17 NOTE — Progress Notes (Addendum)
[facility] day:  02/17/2016  Chief Complaint: Edgar [name] is a 25 y.o. male with stage IIB testicular cancer who is seen for assessment prior to day 1 of cycle #3 BEP.  HPI: The patient was last seen in the medical oncology clinic on 02/10/2016.  At that time, he was doing well. He had mild acne on his chest and back.  He received day 15  bleomycin.   During the interim, he has been nauseated. Over the weekend, he woke up nauseated.  He has lost weight.  He denies any headache or neurologic symptoms.  He states that his nausea is better today.  He denies any fevers.  He states that his daughter has had a cough and cold.   Past Medical History:  Diagnosis Date  . Asthma    AS A CHILD-NO INHALERS  . GERD (gastroesophageal reflux disease)    TUMS PRN    Past Surgical History:  Procedure Laterality Date  . ANKLE FRACTURE SURGERY Right 2004  . ORCHIECTOMY Left 11/26/2015   Procedure: ORCHIECTOMY;  Surgeon: [name], MD;  Location: [facility];  Service: Urology;  Laterality: Left;  radical/Inguinal approach  . PERIPHERAL VASCULAR CATHETERIZATION N/A 12/16/2015   Procedure: [name] Cath Insertion;  Surgeon: [name], MD;  Location: [place];  Service: Cardiovascular;  Laterality: N/A;  . WISDOM TOOTH EXTRACTION  2017    Family History  Problem Relation Age of Onset  . Skin cancer Mother   . Breast cancer Maternal Grandmother   . Colon cancer Maternal Grandfather   . Diabetes Maternal Grandfather   . Prostate cancer Neg Hx   . Kidney cancer Neg Hx   . Bladder Cancer Neg Hx     Social History:  reports that he has been smoking Cigarettes.  He has a 4.00 pack-year smoking history. He has quit using smokeless tobacco. His smokeless tobacco use included Snuff. He reports that he uses drugs, including Marijuana. He reports that he does not drink alcohol.  He smokes 1/3 pack a day.  He smokes marijuana.  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  He has a 46 year-old-daughter named [name]. The patient's mother's cell phone number is [phone].  His grandmother recently died.  Her funeral was last week.  He was a Corporate treasurer.  The patient is accompanied by his mother today.  Allergies: No Known Allergies  Current Medications: Current Outpatient Prescriptions  Medication Sig Dispense Refill  . acetaminophen (TYLENOL) 325 MG tablet Take 650 mg by mouth every 6 (six) hours as needed.    Marland Kitchen dexamethasone (DECADRON) 4 MG tablet Take 2 tablets by mouth once a day on the day starting on day 6 of chemotherapy and then take 2 tablets two times a day for 2 days. Take with food. 30 tablet 1  . docusate sodium (COLACE) 100 MG capsule Take 100 mg by mouth 2 (two) times daily as needed.   0  . lidocaine-prilocaine (EMLA) cream Apply small amount of cream over the portacath site 1 hour before chemotherapy treatment, place saran wrap over the cream to protect your clothing 30 g 1  . LORazepam (ATIVAN) 0.5 MG tablet Take 1 tablet (0.5 mg total) by mouth every 6 (six) hours as needed (Nausea or vomiting). 30 tablet 0  . ondansetron (ZOFRAN) 8 MG tablet Take 1 tablet (8 mg total) by mouth 2 (two) times daily as  needed. Start on day 8 of chemotherapy. 30 tablet 1   No current facility-administered medications for this visit.     Review of Systems:  GENERAL:  Feels "ok".  No fevers or sweats.  Weight down 11 pounds. PERFORMANCE STATUS (ECOG):  0 HEENT:  Herpetic lip lesion resolved.  No visual changes, runny nose, sore throat, mouth sores or tenderness. Lungs: No shortness of breath.  No cough.  No hemoptysis. Cardiac:  No chest pain, palpitations, orthopnea, or PND. GI:  Nausea, improving.  No vomiting, diarrhea, constipation, melena or hematochezia. GU:  No urgency, frequency, dysuria, or hematuria. Musculoskeletal:  No back pain.  No joint pain.  No muscle tenderness. Extremities:  No pain  or swelling. Skin:  No rashes, ulcers, or skin changes. Neuro:  No headache, numbness or weakness, balance or coordination issues. Endocrine:  No diabetes, thyroid issues, hot flashes or night sweats. Psych:  No mood changes, depression or anxiety. Pain:  No pain. Review of systems:  All other systems reviewed and found to be negative.  Physical Exam: 266 Blood pressure (!) 138/98, pulse 92, temperature 97.7 F (36.5 C), temperature source Tympanic, resp. rate 18, height 6' 3"  (1.905 m), weight 275 lb 9.2 oz (125 kg). GENERAL:  Well developed, well nourished, slightly fatigued appearing gentleman sitting comfortably in the exam room in no acute distress. MENTAL STATUS:  Alert and oriented to person, place and time. HEAD:  Alopecia totalis.  Normocephalic, atraumatic, face symmetric, no Cushingoid features. EYES:  Pupils equal round and reactive to light and accomodation.  No conjunctivitis or scleral icterus. ENT:  Small right upper lip eschar s/p healed herpetic lip lesion.  Oropharynx clear without lesion.  Tongue normal. Mucous membranes moist.  RESPIRATORY:  Clear to auscultation without rales, wheezes or rhonchi. CARDIOVASCULAR:  Regular rate and rhythm without murmur, rub or gallop. ABDOMEN:  Soft, non-tender, with active bowel sounds, and no appreciable hepatosplenomegaly.  No masses. SKIN:  No rashes, ulcers or lesions. EXTREMITIES: No edema, no skin discoloration or tenderness.  No palpable cords. LYMPH NODES: No palpable cervical, supraclavicular, axillary or inguinal adenopathy  NEUROLOGICAL: Unremarkable. PSYCH:  Appropriate.    Appointment on 02/17/2016  Component Date Value Ref Range Status  . WBC 02/17/2016 2.6* 3.8 - 10.6 K/uL Final  . RBC 02/17/2016 4.18* 4.40 - 5.90 MIL/uL Final  . Hemoglobin 02/17/2016 12.6* 13.0 - 18.0 g/dL Final  . HCT 02/17/2016 37.3* 40.0 - 52.0 % Final  . MCV 02/17/2016 89.4  80.0 - 100.0 fL Final  . MCH 02/17/2016 30.1  26.0 - 34.0 pg Final   . MCHC 02/17/2016 33.6  32.0 - 36.0 g/dL Final  . RDW 02/17/2016 16.8* 11.5 - 14.5 % Final  . Platelets 02/17/2016 630* 150 - 440 K/uL Final  . Neutrophils Relative % 02/17/2016 34  % Final  . Neutro Abs 02/17/2016 0.9* 1.4 - 6.5 K/uL Final  . Lymphocytes Relative 02/17/2016 38  % Final  . Lymphs Abs 02/17/2016 1.0  1.0 - 3.6 K/uL Final  . Monocytes Relative 02/17/2016 26  % Final  . Monocytes Absolute 02/17/2016 0.7  0.2 - 1.0 K/uL Final  . Eosinophils Relative 02/17/2016 1  % Final  . Eosinophils Absolute 02/17/2016 0.0  0 - 0.7 K/uL Final  . Basophils Relative 02/17/2016 1  % Final  . Basophils Absolute 02/17/2016 0.0  0 - 0.1 K/uL Final  . Sodium 02/17/2016 142  135 - 145 mmol/L Final  . Potassium 02/17/2016 3.7  3.5 - 5.1 mmol/L  Final  . Chloride 02/17/2016 106  101 - 111 mmol/L Final  . CO2 02/17/2016 27  22 - 32 mmol/L Final  . Glucose, Bld 02/17/2016 117* 65 - 99 mg/dL Final  . BUN 02/17/2016 10  6 - 20 mg/dL Final  . Creatinine, Ser 02/17/2016 0.81  0.61 - 1.24 mg/dL Final  . Calcium 02/17/2016 9.4  8.9 - 10.3 mg/dL Final  . Total Protein 02/17/2016 7.3  6.5 - 8.1 g/dL Final  . Albumin 02/17/2016 4.4  3.5 - 5.0 g/dL Final  . AST 02/17/2016 17  15 - 41 U/L Final  . ALT 02/17/2016 22  17 - 63 U/L Final  . Alkaline Phosphatase 02/17/2016 45  38 - 126 U/L Final  . Total Bilirubin 02/17/2016 0.4  0.3 - 1.2 mg/dL Final  . GFR calc non Af Amer 02/17/2016 >60  >60 mL/min Final  . GFR calc Af Amer 02/17/2016 >60  >60 mL/min Final   Comment: (NOTE) The eGFR has been calculated using the CKD EPI equation. This calculation has not been validated in all clinical situations. eGFR's persistently <60 mL/min signify possible Chronic Kidney Disease.   . Anion gap 02/17/2016 9  5 - 15 Final  . LDH 02/17/2016 161  98 - 192 U/L Final  . Magnesium 02/17/2016 2.0  1.7 - 2.4 mg/dL Final    Assessment:  Edgar [name] is a 25 y.o. male with stage IIB left testicular cancer s/p left  radical orchiectomy on 11/26/2015.  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  Clinical/pathologic stage is T1 N1-2 S0 M0.  Pre surgery labs on 11/20/2015 revealed an AFP of 411.9, beta-HCG 2,669, and LDH 522 (121-224).    Tumor markers have been followed:  AFP was 411.9 (0-8.3) on 11/20/2015, 11.2 on 12/19/2015, 6.3 on 12/23/2015, and 1.3 on 01/27/2016.  Beta-HCG was 2,669 (0-3) on 11/20/2015, 2 on 12/19/2015, 1 on 12/23/2015, and < 1 on 01/27/2016.  LDH was 522 (98-192) on 11/20/2015, 179 on 12/19/2015, 160 on 12/23/2015, 202 on 01/27/2016, and 156 on 02/10/2016.  Chest, abdomen, and pelvic CT scan on 11/28/2015 revealed no mediastinal adenopathy or suspicious pulmonary nodules.   There were 2 prominent left periaortic retroperitoneal lymph nodes (1.7 cm and 0.9 cm) concerning for testicular nodal metastasis.  There were no abnormal lymph nodes above the renal veins.  There were postsurgical change in the left hemiscrotum and left inguinal canal.  There was a 3.3 cm soft tissue abnormality anterior to the left iliopsoas muscle which could represent a metastatic lymph node (N2 lesion).  Head MRI on 12/17/2015 revealed no evidence of metastatic disease.    Pulmonary function testing revealed a FVC 4.71 liters (76% predicted), FEV1 4.18 liters (88% predicted), TLC 7.23 liters (87% predicted), VC 4.88 (79% predicted) and DLCO of 39.3 mL/mmHg/min (100% predicted).  He is undecided about sperm banking.  He declined evaluation for retroperitoneal lymph node dissection (RPLND).  He declined sperm banking.  He is s/p 2 cycles of BEP (12/30/2015 - 02/17/2016).  ANC was 200 on day 18 of cycle #1.  He received OnPro Neulasta with cycle #2.  He felt achy post Neulasta.  Symptomatically, he has had some lingering nausea s/p cycle #2.  He is neutropenic (ANC 900) with reactive thrombocytosis  (630,000).  Plan: 1.  Labs today:  CBC with diff, CMP, Mg, AFP, beta-HCG, LDH. 2.  Postpone chemotherapy this week.  3.  Discuss fever and neutropenia precautions.  Discuss good handwashing.   4.  RTC in 1 week for MD assess, labs (CBC with diff, CMP, Mg), and cycle #3 BEP.   [name], MD  02/17/2016, 9:52 AM

## 2016-02-18 ENCOUNTER — Inpatient Hospital Stay: Payer: BLUE CROSS/BLUE SHIELD

## 2016-02-18 LAB — BETA HCG QUANT (REF LAB): Beta hCG, Tumor Marker: <1 m[IU]/mL (ref 0–3)

## 2016-02-18 LAB — AFP TUMOR MARKER: AFP-Tumor Marker: 1.8 ng/mL (ref 0.0–8.3)

## 2016-02-19 ENCOUNTER — Inpatient Hospital Stay: Payer: BLUE CROSS/BLUE SHIELD

## 2016-02-20 ENCOUNTER — Inpatient Hospital Stay: Payer: BLUE CROSS/BLUE SHIELD

## 2016-02-21 ENCOUNTER — Ambulatory Visit: Payer: BLUE CROSS/BLUE SHIELD

## 2016-02-22 ENCOUNTER — Encounter: Payer: Self-pay | Admitting: Hematology and Oncology

## 2016-02-22 NOTE — Progress Notes (Signed)
[facility] day:  02/24/2016  Chief Complaint: Edgar [name] is a 25 y.o. male with stage IIB testicular cancer who is seen for assessment prior to day 1 of cycle #3 BEP.  HPI: The patient was last seen in the medical oncology clinic on 02/17/2016.  At that time, chemotherapy was postponed.  Symptomatically, he was fatigued after prolonged nausea with cycle #2.  He had lost 11 pounds in 1 week.  His daughter was sick.  He was neutropenic.    During the interim, he has done well.  He has felt better since Wednesday, 02/19/2016.  He denies any fevers.  Energy level is better.  His daughter is no longer sick.  He is smoking less.   Past Medical History:  Diagnosis Date  . Asthma    AS A CHILD-NO INHALERS  . GERD (gastroesophageal reflux disease)    TUMS PRN    Past Surgical History:  Procedure Laterality Date  . ANKLE FRACTURE SURGERY Right 2004  . ORCHIECTOMY Left 11/26/2015   Procedure: ORCHIECTOMY;  Surgeon: [name], MD;  Location: [facility];  Service: Urology;  Laterality: Left;  radical/Inguinal approach  . PERIPHERAL VASCULAR CATHETERIZATION N/A 12/16/2015   Procedure: [name] Cath Insertion;  Surgeon: [name], MD;  Location: [facility];  Service: Cardiovascular;  Laterality: N/A;  . WISDOM TOOTH EXTRACTION  2017    Family History  Problem Relation Age of Onset  . Skin cancer Mother   . Breast cancer Maternal Grandmother   . Colon cancer Maternal Grandfather   . Diabetes Maternal Grandfather   . Prostate cancer Neg Hx   . Kidney cancer Neg Hx   . Bladder Cancer Neg Hx     Social History:  reports that he has been smoking Cigarettes.  He has a 4.00 pack-year smoking history. He has quit using smokeless tobacco. His smokeless tobacco use included Snuff. He reports that he uses drugs, including Marijuana. He reports that he does not drink alcohol.  He smokes 1/3 pack a day.  He smokes marijuana.  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  He has a 95 year-old-daughter named [name]. The patient's mother's cell phone number is [phone].  His grandmother recently died.  Her funeral was last week.  He was a Corporate treasurer.  The patient is accompanied by his mother today.  Allergies: No Known Allergies  Current Medications: Current Outpatient Prescriptions  Medication Sig Dispense Refill  . acetaminophen (TYLENOL) 325 MG tablet Take 650 mg by mouth every 6 (six) hours as needed.    Marland Kitchen dexamethasone (DECADRON) 4 MG tablet Take 2 tablets by mouth once a day on the day starting on day 6 of chemotherapy and then take 2 tablets two times a day for 2 days. Take with food. 30 tablet 1  . docusate sodium (COLACE) 100 MG capsule Take 100 mg by mouth 2 (two) times daily as needed.   0  . lidocaine-prilocaine (EMLA) cream Apply small amount of cream over the portacath site 1 hour before chemotherapy treatment, place saran wrap over the cream to protect your clothing 30 g 1  . LORazepam (ATIVAN) 0.5 MG tablet Take 1 tablet (0.5 mg total) by mouth every 6 (six) hours as needed (Nausea or vomiting). 30 tablet 0  . ondansetron (ZOFRAN) 8 MG tablet Take 1 tablet (8 mg total) by mouth 2 (two) times daily as needed. Start on  day 8 of chemotherapy. 30 tablet 1   No current facility-administered medications for this visit.    Facility-Administered Medications Ordered in Other Visits  Medication Dose Route Frequency Provider Last Rate Last Dose  . heparin lock flush 100 unit/mL  500 Units Intravenous Once [name], MD      . sodium chloride flush (NS) 0.9 % injection 10 mL  10 mL Intravenous PRN [name], MD   10 mL at 02/24/16 0843    Review of Systems:  GENERAL:  Feels good.  No fevers or sweats.  Weight stable. PERFORMANCE STATUS (ECOG):  0 HEENT:  No visual changes, runny nose, sore throat, mouth sores or tenderness. Lungs: No shortness of breath.  No cough.  No  hemoptysis. Cardiac:  No chest pain, palpitations, orthopnea, or PND. GI:  No nausea, vomiting, diarrhea, constipation, melena or hematochezia. GU:  No urgency, frequency, dysuria, or hematuria. Musculoskeletal:  No back pain.  No joint pain.  No muscle tenderness. Extremities:  No pain or swelling. Skin:  No rashes, ulcers, or skin changes. Neuro:  No headache, numbness or weakness, balance or coordination issues. Endocrine:  No diabetes, thyroid issues, hot flashes or night sweats. Psych:  No mood changes, depression or anxiety. Pain:  No pain. Review of systems:  All other systems reviewed and found to be negative.  Physical Exam: 266 Blood pressure (!) 134/91, pulse (!) 112, temperature 97.4 F (36.3 C), temperature source Tympanic, resp. rate 18, weight 275 lb 5.7 oz (124.9 kg). GENERAL:  Well developed, well nourished, gentleman sitting comfortably in the exam room in no acute distress. MENTAL STATUS:  Alert and oriented to person, place and time. HEAD:  Wearing a cap.  Alopecia totalis.  Normocephalic, atraumatic, face symmetric, no Cushingoid features. EYES:  Pupils equal round and reactive to light and accomodation.  No conjunctivitis or scleral icterus. ENT:  Oropharynx clear without lesion.  Tongue normal. Mucous membranes moist.  RESPIRATORY:  Clear to auscultation without rales, wheezes or rhonchi. CARDIOVASCULAR:  Regular rate and rhythm without murmur, rub or gallop. ABDOMEN:  Soft, non-tender, with active bowel sounds, and no appreciable hepatosplenomegaly.  No masses. SKIN:  No rashes, ulcers or lesions. EXTREMITIES: No edema, no skin discoloration or tenderness.  No palpable cords. LYMPH NODES: No palpable cervical, supraclavicular, axillary or inguinal adenopathy  NEUROLOGICAL: Unremarkable. PSYCH:  Appropriate.    Infusion on 02/24/2016  Component Date Value Ref Range Status  . WBC 02/24/2016 9.5  3.8 - 10.6 K/uL Final  . RBC 02/24/2016 4.55  4.40 - 5.90 MIL/uL  Final  . Hemoglobin 02/24/2016 13.6  13.0 - 18.0 g/dL Final  . HCT 02/24/2016 40.4  40.0 - 52.0 % Final  . MCV 02/24/2016 88.8  80.0 - 100.0 fL Final  . MCH 02/24/2016 29.9  26.0 - 34.0 pg Final  . MCHC 02/24/2016 33.6  32.0 - 36.0 g/dL Final  . RDW 02/24/2016 16.4* 11.5 - 14.5 % Final  . Platelets 02/24/2016 437  150 - 440 K/uL Final  . Neutrophils Relative % 02/24/2016 69  % Final  . Neutro Abs 02/24/2016 6.5  1.4 - 6.5 K/uL Final  . Lymphocytes Relative 02/24/2016 17  % Final  . Lymphs Abs 02/24/2016 1.6  1.0 - 3.6 K/uL Final  . Monocytes Relative 02/24/2016 12  % Final  . Monocytes Absolute 02/24/2016 1.1* 0.2 - 1.0 K/uL Final  . Eosinophils Relative 02/24/2016 1  % Final  . Eosinophils Absolute 02/24/2016 0.1  0 - 0.7  K/uL Final  . Basophils Relative 02/24/2016 1  % Final  . Basophils Absolute 02/24/2016 0.1  0 - 0.1 K/uL Final  . Sodium 02/24/2016 136  135 - 145 mmol/L Final  . Potassium 02/24/2016 4.0  3.5 - 5.1 mmol/L Final  . Chloride 02/24/2016 101  101 - 111 mmol/L Final  . CO2 02/24/2016 28  22 - 32 mmol/L Final  . Glucose, Bld 02/24/2016 114* 65 - 99 mg/dL Final  . BUN 02/24/2016 12  6 - 20 mg/dL Final  . Creatinine, Ser 02/24/2016 0.79  0.61 - 1.24 mg/dL Final  . Calcium 02/24/2016 9.4  8.9 - 10.3 mg/dL Final  . Total Protein 02/24/2016 7.6  6.5 - 8.1 g/dL Final  . Albumin 02/24/2016 4.5  3.5 - 5.0 g/dL Final  . AST 02/24/2016 20  15 - 41 U/L Final  . ALT 02/24/2016 23  17 - 63 U/L Final  . Alkaline Phosphatase 02/24/2016 51  38 - 126 U/L Final  . Total Bilirubin 02/24/2016 0.4  0.3 - 1.2 mg/dL Final  . GFR calc non Af Amer 02/24/2016 >60  >60 mL/min Final  . GFR calc Af Amer 02/24/2016 >60  >60 mL/min Final   Comment: (NOTE) The eGFR has been calculated using the CKD EPI equation. This calculation has not been validated in all clinical situations. eGFR's persistently <60 mL/min signify possible Chronic Kidney Disease.   . Anion gap 02/24/2016 7  5 - 15 Final  .  Magnesium 02/24/2016 2.0  1.7 - 2.4 mg/dL Final    Assessment:  Edgar [name] is a 25 y.o. male with stage IIB left testicular cancer s/p left radical orchiectomy on 11/26/2015.  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  Clinical/pathologic stage is T1 N1-2 S0 M0.  Pre surgery labs on 11/20/2015 revealed an AFP of 411.9, beta-HCG 2,669, and LDH 522 (121-224).    Tumor markers have been followed:  AFP was 411.9 (0-8.3) on 11/20/2015, 11.2 on 12/19/2015, 6.3 on 12/23/2015, 1.3 on 01/27/2016, and 1.8 on 02/17/2016.  Beta-HCG was 2,669 (0-3) on 11/20/2015, 2 on 12/19/2015, 1 on 12/23/2015, < 1 on 01/27/2016, and < 1 on 02/17/2016.  LDH was 522 (98-192) on 11/20/2015, 179 on 12/19/2015, 160 on 12/23/2015, 202 on 01/27/2016, 156 on 02/10/2016, and 161 on 02/17/2016.  Chest, abdomen, and pelvic CT scan on 11/28/2015 revealed no mediastinal adenopathy or suspicious pulmonary nodules.   There were 2 prominent left periaortic retroperitoneal lymph nodes (1.7 cm and 0.9 cm) concerning for testicular nodal metastasis.  There were no abnormal lymph nodes above the renal veins.  There were postsurgical change in the left hemiscrotum and left inguinal canal.  There was a 3.3 cm soft tissue abnormality anterior to the left iliopsoas muscle which could represent a metastatic lymph node (N2 lesion).  Head MRI on 12/17/2015 revealed no evidence of metastatic disease.    Pulmonary function testing revealed a FVC 4.71 liters (76% predicted), FEV1 4.18 liters (88% predicted), TLC 7.23 liters (87% predicted), VC 4.88 (79% predicted) and DLCO of 39.3 mL/mmHg/min (100% predicted).  He is undecided about sperm banking.  He declined evaluation for retroperitoneal lymph node dissection (RPLND).  He declined sperm banking.  He is s/p 2 cycles of BEP (12/30/2015 - 02/17/2016).  ANC was 200 on day 18 of cycle #1.   He received OnPro Neulasta with cycle #2.  He felt achy post  Neulasta.  Symptomatically, he feels well.  Counts have recovered.  Plan: 1.  Labs today:  CBC with diff, CMP, Mg. 2.  Begin cycle #3 BEP today (Monday-Friday) with OnPro Neulasta on 12/15. 3.  RTC on 12/18 for MD assessment, labs (CBC with diff, CMP, Mg) and day 8 bleomycin. 4.  RTC on 12/26 for MD assessment, labs (CBC with diff, CMP, Mg) and day 15 bleomycin.   [name], MD  02/24/2016, 9:38 AM

## 2016-02-24 ENCOUNTER — Encounter: Payer: Self-pay | Admitting: Hematology and Oncology

## 2016-02-24 ENCOUNTER — Inpatient Hospital Stay (HOSPITAL_BASED_OUTPATIENT_CLINIC_OR_DEPARTMENT_OTHER): Payer: BLUE CROSS/BLUE SHIELD | Admitting: Hematology and Oncology

## 2016-02-24 ENCOUNTER — Inpatient Hospital Stay: Payer: BLUE CROSS/BLUE SHIELD

## 2016-02-24 VITALS — BP 136/95 | HR 91 | Temp 97.4°F | Resp 18 | Wt 275.4 lb

## 2016-02-24 DIAGNOSIS — C6212 Malignant neoplasm of descended left testis: Secondary | ICD-10-CM | POA: Diagnosis not present

## 2016-02-24 DIAGNOSIS — R110 Nausea: Secondary | ICD-10-CM

## 2016-02-24 DIAGNOSIS — Z5111 Encounter for antineoplastic chemotherapy: Secondary | ICD-10-CM

## 2016-02-24 DIAGNOSIS — Z9079 Acquired absence of other genital organ(s): Secondary | ICD-10-CM

## 2016-02-24 DIAGNOSIS — Z7689 Persons encountering health services in other specified circumstances: Secondary | ICD-10-CM

## 2016-02-24 DIAGNOSIS — Z808 Family history of malignant neoplasm of other organs or systems: Secondary | ICD-10-CM

## 2016-02-24 DIAGNOSIS — D709 Neutropenia, unspecified: Secondary | ICD-10-CM

## 2016-02-24 DIAGNOSIS — R634 Abnormal weight loss: Secondary | ICD-10-CM

## 2016-02-24 DIAGNOSIS — Z8781 Personal history of (healed) traumatic fracture: Secondary | ICD-10-CM

## 2016-02-24 DIAGNOSIS — L709 Acne, unspecified: Secondary | ICD-10-CM | POA: Diagnosis not present

## 2016-02-24 DIAGNOSIS — R599 Enlarged lymph nodes, unspecified: Secondary | ICD-10-CM

## 2016-02-24 DIAGNOSIS — Z79899 Other long term (current) drug therapy: Secondary | ICD-10-CM

## 2016-02-24 DIAGNOSIS — K219 Gastro-esophageal reflux disease without esophagitis: Secondary | ICD-10-CM

## 2016-02-24 DIAGNOSIS — F17210 Nicotine dependence, cigarettes, uncomplicated: Secondary | ICD-10-CM

## 2016-02-24 LAB — CBC WITH DIFFERENTIAL/PLATELET
Basophils Absolute: 0.1 10*3/uL (ref 0–0.1)
Basophils Relative: 1 %
Eosinophils Absolute: 0.1 10*3/uL (ref 0–0.7)
Eosinophils Relative: 1 %
HCT: 40.4 % (ref 40.0–52.0)
Hemoglobin: 13.6 g/dL (ref 13.0–18.0)
Lymphocytes Relative: 17 %
Lymphs Abs: 1.6 10*3/uL (ref 1.0–3.6)
MCH: 29.9 pg (ref 26.0–34.0)
MCHC: 33.6 g/dL (ref 32.0–36.0)
MCV: 88.8 fL (ref 80.0–100.0)
Monocytes Absolute: 1.1 10*3/uL — ABNORMAL HIGH (ref 0.2–1.0)
Monocytes Relative: 12 %
Neutro Abs: 6.5 10*3/uL (ref 1.4–6.5)
Neutrophils Relative %: 69 %
Platelets: 437 10*3/uL (ref 150–440)
RBC: 4.55 MIL/uL (ref 4.40–5.90)
RDW: 16.4 % — ABNORMAL HIGH (ref 11.5–14.5)
WBC: 9.5 10*3/uL (ref 3.8–10.6)

## 2016-02-24 LAB — COMPREHENSIVE METABOLIC PANEL
ALT: 23 U/L (ref 17–63)
AST: 20 U/L (ref 15–41)
Albumin: 4.5 g/dL (ref 3.5–5.0)
Alkaline Phosphatase: 51 U/L (ref 38–126)
Anion gap: 7 (ref 5–15)
BUN: 12 mg/dL (ref 6–20)
CO2: 28 mmol/L (ref 22–32)
Calcium: 9.4 mg/dL (ref 8.9–10.3)
Chloride: 101 mmol/L (ref 101–111)
Creatinine, Ser: 0.79 mg/dL (ref 0.61–1.24)
GFR calc Af Amer: >60 mL/min (ref >60)
GFR calc non Af Amer: >60 mL/min (ref >60)
Glucose, Bld: 114 mg/dL — ABNORMAL HIGH (ref 65–99)
Potassium: 4 mmol/L (ref 3.5–5.1)
Sodium: 136 mmol/L (ref 135–145)
Total Bilirubin: 0.4 mg/dL (ref 0.3–1.2)
Total Protein: 7.6 g/dL (ref 6.5–8.1)

## 2016-02-24 LAB — MAGNESIUM: Magnesium: 2 mg/dL (ref 1.7–2.4)

## 2016-02-24 MED ORDER — SODIUM CHLORIDE 0.9 % IV SOLN
Freq: Once | INTRAVENOUS | Status: AC
  Administered 2016-02-24: 10:00:00 via INTRAVENOUS
  Filled 2016-02-24: qty 1000

## 2016-02-24 MED ORDER — SODIUM CHLORIDE 0.9 % IV SOLN
Freq: Once | INTRAVENOUS | Status: AC
  Administered 2016-02-24: 13:00:00 via INTRAVENOUS
  Filled 2016-02-24: qty 5

## 2016-02-24 MED ORDER — SODIUM CHLORIDE 0.9% FLUSH
10.0000 mL | INTRAVENOUS | Status: DC | PRN
  Administered 2016-02-24: 10 mL via INTRAVENOUS
  Filled 2016-02-24: qty 10

## 2016-02-24 MED ORDER — HEPARIN SOD (PORK) LOCK FLUSH 100 UNIT/ML IV SOLN
500.0000 [IU] | Freq: Once | INTRAVENOUS | Status: AC
  Administered 2016-02-24: 500 [IU] via INTRAVENOUS
  Filled 2016-02-24: qty 5

## 2016-02-24 MED ORDER — DEXTROSE-NACL 5-0.45 % IV SOLN
Freq: Once | INTRAVENOUS | Status: AC
  Administered 2016-02-24: 11:00:00 via INTRAVENOUS
  Filled 2016-02-24: qty 1000

## 2016-02-24 MED ORDER — DEXAMETHASONE SODIUM PHOSPHATE 10 MG/ML IJ SOLN: 10.0000 mg | Freq: Once | INTRAMUSCULAR | Status: DC

## 2016-02-24 MED ORDER — CISPLATIN CHEMO INJECTION 100MG/100ML
20.0000 mg/m2 | Freq: Once | INTRAVENOUS | Status: AC
  Administered 2016-02-24: 51 mg via INTRAVENOUS
  Filled 2016-02-24: qty 51

## 2016-02-24 MED ORDER — PALONOSETRON HCL INJECTION 0.25 MG/5ML
0.2500 mg | Freq: Once | INTRAVENOUS | Status: AC
  Administered 2016-02-24: 0.25 mg via INTRAVENOUS
  Filled 2016-02-24: qty 5

## 2016-02-24 MED ORDER — SODIUM CHLORIDE 0.9 % IV SOLN
100.0000 mg/m2 | Freq: Once | INTRAVENOUS | Status: AC
  Administered 2016-02-24: 250 mg via INTRAVENOUS
  Filled 2016-02-24: qty 12.5

## 2016-02-24 MED ORDER — SODIUM CHLORIDE 0.9 % IV SOLN
30.0000 [IU] | Freq: Once | INTRAVENOUS | Status: AC
  Administered 2016-02-24: 30 [IU] via INTRAVENOUS
  Filled 2016-02-24: qty 10

## 2016-02-24 NOTE — Progress Notes (Signed)
Patient offers no complaints today.  BP elevated. 142/96  HR 105. Recheck 134/91  HR 112.

## 2016-02-25 ENCOUNTER — Other Ambulatory Visit: Payer: Self-pay | Admitting: Hematology and Oncology

## 2016-02-25 ENCOUNTER — Inpatient Hospital Stay: Payer: BLUE CROSS/BLUE SHIELD

## 2016-02-25 VITALS — BP 128/80 | HR 105 | Temp 96.0°F | Resp 18

## 2016-02-25 DIAGNOSIS — C6212 Malignant neoplasm of descended left testis: Secondary | ICD-10-CM

## 2016-02-25 MED ORDER — DEXAMETHASONE SODIUM PHOSPHATE 10 MG/ML IJ SOLN
10.0000 mg | Freq: Once | INTRAMUSCULAR | Status: AC
  Administered 2016-02-25: 10 mg via INTRAVENOUS
  Filled 2016-02-25: qty 1

## 2016-02-25 MED ORDER — ETOPOSIDE CHEMO INJECTION 1 GM/50ML
100.0000 mg/m2 | Freq: Once | INTRAVENOUS | Status: AC
  Administered 2016-02-25: 250 mg via INTRAVENOUS
  Filled 2016-02-25: qty 12.5

## 2016-02-25 MED ORDER — SODIUM CHLORIDE 0.9% FLUSH
10.0000 mL | INTRAVENOUS | Status: DC | PRN
  Administered 2016-02-25: 10 mL
  Filled 2016-02-25: qty 10

## 2016-02-25 MED ORDER — POTASSIUM CHLORIDE 2 MEQ/ML IV SOLN
Freq: Once | INTRAVENOUS | Status: AC
  Administered 2016-02-25: 09:00:00 via INTRAVENOUS
  Filled 2016-02-25: qty 1000

## 2016-02-25 MED ORDER — SODIUM CHLORIDE 0.9 % IV SOLN
Freq: Once | INTRAVENOUS | Status: AC
  Administered 2016-02-25: 09:00:00 via INTRAVENOUS
  Filled 2016-02-25: qty 1000

## 2016-02-25 MED ORDER — HEPARIN SOD (PORK) LOCK FLUSH 100 UNIT/ML IV SOLN
500.0000 [IU] | Freq: Once | INTRAVENOUS | Status: AC | PRN
  Administered 2016-02-25: 500 [IU]
  Filled 2016-02-25: qty 5

## 2016-02-25 MED ORDER — SODIUM CHLORIDE 0.9 % IV SOLN
20.0000 mg/m2 | Freq: Once | INTRAVENOUS | Status: AC
  Administered 2016-02-25: 51 mg via INTRAVENOUS
  Filled 2016-02-25: qty 51

## 2016-02-26 ENCOUNTER — Inpatient Hospital Stay: Payer: BLUE CROSS/BLUE SHIELD

## 2016-02-26 VITALS — BP 119/70 | HR 81 | Temp 96.8°F | Resp 18

## 2016-02-26 DIAGNOSIS — C6212 Malignant neoplasm of descended left testis: Secondary | ICD-10-CM

## 2016-02-26 MED ORDER — SODIUM CHLORIDE 0.9 % IV SOLN
Freq: Once | INTRAVENOUS | Status: AC
  Administered 2016-02-26: 09:00:00 via INTRAVENOUS
  Filled 2016-02-26: qty 1000

## 2016-02-26 MED ORDER — SODIUM CHLORIDE 0.9 % IV SOLN
100.0000 mg/m2 | Freq: Once | INTRAVENOUS | Status: AC
  Administered 2016-02-26: 250 mg via INTRAVENOUS
  Filled 2016-02-26: qty 12.5

## 2016-02-26 MED ORDER — POTASSIUM CHLORIDE 2 MEQ/ML IV SOLN
Freq: Once | INTRAVENOUS | Status: AC
  Administered 2016-02-26: 09:00:00 via INTRAVENOUS
  Filled 2016-02-26: qty 10

## 2016-02-26 MED ORDER — HEPARIN SOD (PORK) LOCK FLUSH 100 UNIT/ML IV SOLN
500.0000 [IU] | Freq: Once | INTRAVENOUS | Status: AC | PRN
  Administered 2016-02-26: 500 [IU]
  Filled 2016-02-26: qty 5

## 2016-02-26 MED ORDER — PALONOSETRON HCL INJECTION 0.25 MG/5ML
0.2500 mg | Freq: Once | INTRAVENOUS | Status: AC
  Administered 2016-02-26: 0.25 mg via INTRAVENOUS
  Filled 2016-02-26: qty 5

## 2016-02-26 MED ORDER — SODIUM CHLORIDE 0.9 % IV SOLN
Freq: Once | INTRAVENOUS | Status: AC
  Administered 2016-02-26: 12:00:00 via INTRAVENOUS
  Filled 2016-02-26: qty 5

## 2016-02-26 MED ORDER — SODIUM CHLORIDE 0.9 % IV SOLN
20.0000 mg/m2 | Freq: Once | INTRAVENOUS | Status: AC
  Administered 2016-02-26: 51 mg via INTRAVENOUS
  Filled 2016-02-26: qty 51

## 2016-02-26 MED ORDER — SODIUM CHLORIDE 0.9% FLUSH
10.0000 mL | INTRAVENOUS | Status: DC | PRN
  Administered 2016-02-26: 10 mL
  Filled 2016-02-26: qty 10

## 2016-02-27 ENCOUNTER — Inpatient Hospital Stay: Payer: BLUE CROSS/BLUE SHIELD

## 2016-02-27 ENCOUNTER — Other Ambulatory Visit: Payer: Self-pay | Admitting: Hematology and Oncology

## 2016-02-27 VITALS — BP 131/77 | HR 69 | Temp 97.1°F | Resp 18

## 2016-02-27 DIAGNOSIS — C6212 Malignant neoplasm of descended left testis: Secondary | ICD-10-CM | POA: Diagnosis not present

## 2016-02-27 MED ORDER — ETOPOSIDE CHEMO INJECTION 1 GM/50ML: 100.0000 mg/m2 | Freq: Once | INTRAVENOUS | Status: DC

## 2016-02-27 MED ORDER — POTASSIUM CHLORIDE 2 MEQ/ML IV SOLN
Freq: Once | INTRAVENOUS | Status: AC
  Administered 2016-02-27: 09:00:00 via INTRAVENOUS
  Filled 2016-02-27: qty 1000

## 2016-02-27 MED ORDER — DEXAMETHASONE SODIUM PHOSPHATE 10 MG/ML IJ SOLN
10.0000 mg | Freq: Once | INTRAMUSCULAR | Status: AC
  Administered 2016-02-27: 10 mg via INTRAVENOUS
  Filled 2016-02-27: qty 1

## 2016-02-27 MED ORDER — HEPARIN SOD (PORK) LOCK FLUSH 100 UNIT/ML IV SOLN
500.0000 [IU] | Freq: Once | INTRAVENOUS | Status: AC | PRN
  Administered 2016-02-27: 500 [IU]
  Filled 2016-02-27: qty 5

## 2016-02-27 MED ORDER — SODIUM CHLORIDE 0.9 % IV SOLN: 10.0000 mg | Freq: Once | INTRAVENOUS | Status: DC

## 2016-02-27 MED ORDER — SODIUM CHLORIDE 0.9 % IV SOLN
250.0000 mg | Freq: Once | INTRAVENOUS | Status: AC
  Administered 2016-02-27: 250 mg via INTRAVENOUS
  Filled 2016-02-27: qty 12.5

## 2016-02-27 MED ORDER — SODIUM CHLORIDE 0.9% FLUSH
10.0000 mL | INTRAVENOUS | Status: DC | PRN
  Administered 2016-02-27: 10 mL
  Filled 2016-02-27: qty 10

## 2016-02-27 MED ORDER — SODIUM CHLORIDE 0.9 % IV SOLN
Freq: Once | INTRAVENOUS | Status: AC
  Administered 2016-02-27: 09:00:00 via INTRAVENOUS
  Filled 2016-02-27: qty 1000

## 2016-02-27 MED ORDER — SODIUM CHLORIDE 0.9 % IV SOLN
20.0000 mg/m2 | Freq: Once | INTRAVENOUS | Status: AC
  Administered 2016-02-27: 51 mg via INTRAVENOUS
  Filled 2016-02-27: qty 51

## 2016-02-28 ENCOUNTER — Inpatient Hospital Stay: Payer: BLUE CROSS/BLUE SHIELD

## 2016-02-28 ENCOUNTER — Encounter: Payer: Self-pay | Admitting: *Deleted

## 2016-02-28 VITALS — BP 129/80 | HR 91 | Temp 96.5°F | Resp 18

## 2016-02-28 DIAGNOSIS — C6212 Malignant neoplasm of descended left testis: Secondary | ICD-10-CM

## 2016-02-28 MED ORDER — SODIUM CHLORIDE 0.9 % IV SOLN
Freq: Once | INTRAVENOUS | Status: AC
  Administered 2016-02-28: 11:00:00 via INTRAVENOUS
  Filled 2016-02-28: qty 5

## 2016-02-28 MED ORDER — CISPLATIN CHEMO INJECTION 100MG/100ML
20.0000 mg/m2 | Freq: Once | INTRAVENOUS | Status: AC
  Administered 2016-02-28: 51 mg via INTRAVENOUS
  Filled 2016-02-28: qty 51

## 2016-02-28 MED ORDER — SODIUM CHLORIDE 0.9 % IV SOLN
100.0000 mg/m2 | Freq: Once | INTRAVENOUS | Status: AC
  Administered 2016-02-28: 250 mg via INTRAVENOUS
  Filled 2016-02-28: qty 12.5

## 2016-02-28 MED ORDER — HEPARIN SOD (PORK) LOCK FLUSH 100 UNIT/ML IV SOLN
500.0000 [IU] | Freq: Once | INTRAVENOUS | Status: AC | PRN
  Administered 2016-02-28: 500 [IU]
  Filled 2016-02-28: qty 5

## 2016-02-28 MED ORDER — PALONOSETRON HCL INJECTION 0.25 MG/5ML
0.2500 mg | Freq: Once | INTRAVENOUS | Status: AC
  Administered 2016-02-28: 0.25 mg via INTRAVENOUS
  Filled 2016-02-28: qty 5

## 2016-02-28 MED ORDER — SODIUM CHLORIDE 0.9% FLUSH
10.0000 mL | INTRAVENOUS | Status: DC | PRN
  Administered 2016-02-28: 10 mL
  Filled 2016-02-28: qty 10

## 2016-02-28 MED ORDER — SODIUM CHLORIDE 0.9 % IV SOLN
Freq: Once | INTRAVENOUS | Status: AC
  Administered 2016-02-28: 09:00:00 via INTRAVENOUS
  Filled 2016-02-28: qty 1000

## 2016-02-28 MED ORDER — POTASSIUM CHLORIDE 2 MEQ/ML IV SOLN
Freq: Once | INTRAVENOUS | Status: AC
  Administered 2016-02-28: 09:00:00 via INTRAVENOUS
  Filled 2016-02-28: qty 10

## 2016-02-28 MED ORDER — PEGFILGRASTIM 6 MG/0.6ML ~~LOC~~ PSKT
6.0000 mg | PREFILLED_SYRINGE | Freq: Once | SUBCUTANEOUS | Status: AC
  Administered 2016-02-28: 6 mg via SUBCUTANEOUS
  Filled 2016-02-28: qty 0.6

## 2016-02-29 NOTE — Progress Notes (Signed)
[facility] day:  03/02/2016  Chief Complaint: Edgar [name] is a 25 y.o. male with stage IIB testicular cancer who is seen for assessment prior to day 8 of cycle #3 BEP.  HPI: The patient was last seen in the medical oncology clinic on 02/24/2016.  At that time, he was doing well.  He began cycle #3 BEP.  He received OnPro Neulasta on 02/28/2016.  Symptomatically,  he denies any complaint.  He has smoked very little this past week.  He has felt a little achy s/p Neulasta.  He is taking his Claritin.   Past Medical History:  Diagnosis Date  . Asthma    AS A CHILD-NO INHALERS  . GERD (gastroesophageal reflux disease)    TUMS PRN    Past Surgical History:  Procedure Laterality Date  . ANKLE FRACTURE SURGERY Right 2004  . ORCHIECTOMY Left 11/26/2015   Procedure: ORCHIECTOMY;  Surgeon: [name], MD;  Location: [facility];  Service: Urology;  Laterality: Left;  radical/Inguinal approach  . PERIPHERAL VASCULAR CATHETERIZATION N/A 12/16/2015   Procedure: [name] Cath Insertion;  Surgeon: [name], MD;  Location: [facility];  Service: Cardiovascular;  Laterality: N/A;  . WISDOM TOOTH EXTRACTION  2017    Family History  Problem Relation Age of Onset  . Skin cancer Mother   . Breast cancer Maternal Grandmother   . Colon cancer Maternal Grandfather   . Diabetes Maternal Grandfather   . Prostate cancer Neg Hx   . Kidney cancer Neg Hx   . Bladder Cancer Neg Hx     Social History:  reports that he has been smoking Cigarettes.  He has a 4.00 pack-year smoking history. He has quit using smokeless tobacco. His smokeless tobacco use included Snuff. He reports that he uses drugs, including Marijuana. He reports that he does not drink alcohol.  He smokes 1/3 pack a day.  He smokes marijuana.  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  He has a 64 year-old-daughter named [name]. The patient's mother's  cell phone number is [phone].  His grandmother recently died.  Her funeral was last week.  He was a Corporate treasurer.  The patient is accompanied by his mother today.  Allergies: No Known Allergies  Current Medications: Current Outpatient Prescriptions  Medication Sig Dispense Refill  . acetaminophen (TYLENOL) 325 MG tablet Take 650 mg by mouth every 6 (six) hours as needed.    Marland Kitchen dexamethasone (DECADRON) 4 MG tablet Take 2 tablets by mouth once a day on the day starting on day 6 of chemotherapy and then take 2 tablets two times a day for 2 days. Take with food. 30 tablet 1  . docusate sodium (COLACE) 100 MG capsule Take 100 mg by mouth 2 (two) times daily as needed.   0  . lidocaine-prilocaine (EMLA) cream Apply small amount of cream over the portacath site 1 hour before chemotherapy treatment, place saran wrap over the cream to protect your clothing 30 g 1  . LORazepam (ATIVAN) 0.5 MG tablet Take 1 tablet (0.5 mg total) by mouth every 6 (six) hours as needed (Nausea or vomiting). 30 tablet 0  . ondansetron (ZOFRAN) 8 MG tablet Take 1 tablet (8 mg total) by mouth 2 (two) times daily as needed. Start on day 8 of chemotherapy. 30 tablet 1   No current facility-administered medications for this visit.    Facility-Administered Medications Ordered in Other  Visits  Medication Dose Route Frequency Provider Last Rate Last Dose  . heparin lock flush 100 unit/mL  500 Units Intravenous Once [name], MD        Review of Systems:  GENERAL:  Feels good.  No fevers or sweats.  Weight up 11 pounds. PERFORMANCE STATUS (ECOG):  0 HEENT:  No visual changes, runny nose, sore throat, mouth sores or tenderness. Lungs: No shortness of breath.  No cough.  No hemoptysis. Cardiac:  No chest pain, palpitations, orthopnea, or PND. GI:  No nausea, vomiting, diarrhea, constipation, melena or hematochezia. GU:  No urgency, frequency, dysuria, or hematuria. Musculoskeletal:  Little achy post Neulasta.  No  back pain.  No joint pain.  No muscle tenderness. Extremities:  No pain or swelling. Skin:  No rashes, ulcers, or skin changes. Neuro:  No headache, numbness or weakness, balance or coordination issues. Endocrine:  No diabetes, thyroid issues, hot flashes or night sweats. Psych:  No mood changes, depression or anxiety. Pain:  No pain. Review of systems:  All other systems reviewed and found to be negative.  Physical Exam: 266 Blood pressure 117/83, pulse 80, temperature 97.3 F (36.3 C), temperature source Tympanic, resp. rate 18, weight 286 lb 13.1 oz (130.1 kg). GENERAL:  Well developed, well nourished, gentleman sitting comfortably in the exam room in no acute distress. MENTAL STATUS:  Alert and oriented to person, place and time. HEAD:  Wearing a cap.  Alopecia totalis.  Normocephalic, atraumatic, face symmetric, no Cushingoid features. EYES:  Pupils equal round and reactive to light and accomodation.  No conjunctivitis or scleral icterus. ENT:  Oropharynx clear without lesion.  Tongue normal. Mucous membranes moist.  RESPIRATORY:  Clear to auscultation without rales, wheezes or rhonchi. CARDIOVASCULAR:  Regular rate and rhythm without murmur, rub or gallop. ABDOMEN:  Soft, non-tender, with active bowel sounds, and no appreciable hepatosplenomegaly.  No masses. SKIN:  No rashes, ulcers or lesions. EXTREMITIES: No edema, no skin discoloration or tenderness.  No palpable cords. LYMPH NODES: No palpable cervical, supraclavicular, axillary or inguinal adenopathy  NEUROLOGICAL: Unremarkable. PSYCH:  Appropriate.    Infusion on 03/02/2016  Component Date Value Ref Range Status  . WBC 03/02/2016 73.1* 3.8 - 10.6 K/uL Final   Comment: [facility] CRITICAL VALUE PROTOCOL RESULT REPEATED AND VERIFIED   . RBC 03/02/2016 3.79* 4.40 - 5.90 MIL/uL Final  . Hemoglobin 03/02/2016 11.1* 13.0 - 18.0 g/dL Final  . HCT 03/02/2016 34.2* 40.0 - 52.0 % Final  . MCV 03/02/2016 90.2  80.0 - 100.0 fL  Final  . MCH 03/02/2016 29.2  26.0 - 34.0 pg Final  . MCHC 03/02/2016 32.4  32.0 - 36.0 g/dL Final  . RDW 03/02/2016 16.4* 11.5 - 14.5 % Final  . Platelets 03/02/2016 203  150 - 440 K/uL Final  . Neutrophils Relative % 03/02/2016 PENDING  % Incomplete  . Neutro Abs 03/02/2016 PENDING  1.7 - 7.7 K/uL Incomplete  . Band Neutrophils 03/02/2016 PENDING  % Incomplete  . Lymphocytes Relative 03/02/2016 PENDING  % Incomplete  . Lymphs Abs 03/02/2016 PENDING  0.7 - 4.0 K/uL Incomplete  . Monocytes Relative 03/02/2016 PENDING  % Incomplete  . Monocytes Absolute 03/02/2016 PENDING  0.1 - 1.0 K/uL Incomplete  . Eosinophils Relative 03/02/2016 PENDING  % Incomplete  . Eosinophils Absolute 03/02/2016 PENDING  0.0 - 0.7 K/uL Incomplete  . Basophils Relative 03/02/2016 PENDING  % Incomplete  . Basophils Absolute 03/02/2016 PENDING  0.0 - 0.1 K/uL Incomplete  . WBC Morphology  03/02/2016 PENDING   Incomplete  . RBC Morphology 03/02/2016 PENDING   Incomplete  . Smear Review 03/02/2016 PENDING   Incomplete  . Other 03/02/2016 PENDING  % Incomplete  . nRBC 03/02/2016 PENDING  0 /100 WBC Incomplete  . Metamyelocytes Relative 03/02/2016 PENDING  % Incomplete  . Myelocytes 03/02/2016 PENDING  % Incomplete  . Promyelocytes Absolute 03/02/2016 PENDING  % Incomplete  . Blasts 03/02/2016 PENDING  % Incomplete  . Sodium 03/02/2016 137  135 - 145 mmol/L Final  . Potassium 03/02/2016 4.0  3.5 - 5.1 mmol/L Final  . Chloride 03/02/2016 103  101 - 111 mmol/L Final  . CO2 03/02/2016 28  22 - 32 mmol/L Final  . Glucose, Bld 03/02/2016 98  65 - 99 mg/dL Final  . BUN 03/02/2016 25* 6 - 20 mg/dL Final  . Creatinine, Ser 03/02/2016 0.80  0.61 - 1.24 mg/dL Final  . Calcium 03/02/2016 8.6* 8.9 - 10.3 mg/dL Final  . Total Protein 03/02/2016 6.6  6.5 - 8.1 g/dL Final  . Albumin 03/02/2016 4.0  3.5 - 5.0 g/dL Final  . AST 03/02/2016 15  15 - 41 U/L Final  . ALT 03/02/2016 13* 17 - 63 U/L Final  . Alkaline Phosphatase  03/02/2016 50  38 - 126 U/L Final  . Total Bilirubin 03/02/2016 0.7  0.3 - 1.2 mg/dL Final  . GFR calc non Af Amer 03/02/2016 >60  >60 mL/min Final  . GFR calc Af Amer 03/02/2016 >60  >60 mL/min Final   Comment: (NOTE) The eGFR has been calculated using the CKD EPI equation. This calculation has not been validated in all clinical situations. eGFR's persistently <60 mL/min signify possible Chronic Kidney Disease.   . Anion gap 03/02/2016 6  5 - 15 Final  . Magnesium 03/02/2016 2.1  1.7 - 2.4 mg/dL Final    Assessment:  Edgar [name] is a 25 y.o. male with stage IIB left testicular cancer s/p left radical orchiectomy on 11/26/2015.  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  Clinical/pathologic stage is T1 N1-2 S0 M0.  Pre surgery labs on 11/20/2015 revealed an AFP of 411.9, beta-HCG 2,669, and LDH 522 (121-224).    Tumor markers have been followed:  AFP was 411.9 (0-8.3) on 11/20/2015, 11.2 on 12/19/2015, 6.3 on 12/23/2015, 1.3 on 01/27/2016, and 1.8 on 02/17/2016.  Beta-HCG was 2,669 (0-3) on 11/20/2015, 2 on 12/19/2015, 1 on 12/23/2015, < 1 on 01/27/2016, and < 1 on 02/17/2016.  LDH was 522 (98-192) on 11/20/2015, 179 on 12/19/2015, 160 on 12/23/2015, 202 on 01/27/2016, 156 on 02/10/2016, and 161 on 02/17/2016.  Chest, abdomen, and pelvic CT scan on 11/28/2015 revealed no mediastinal adenopathy or suspicious pulmonary nodules.   There were 2 prominent left periaortic retroperitoneal lymph nodes (1.7 cm and 0.9 cm) concerning for testicular nodal metastasis.  There were no abnormal lymph nodes above the renal veins.  There were postsurgical change in the left hemiscrotum and left inguinal canal.  There was a 3.3 cm soft tissue abnormality anterior to the left iliopsoas muscle which could represent a metastatic lymph node (N2 lesion).  Head MRI on 12/17/2015 revealed no evidence  of metastatic disease.    Pulmonary function testing revealed a FVC 4.71 liters (76% predicted), FEV1 4.18 liters (88% predicted), TLC 7.23 liters (87% predicted), VC 4.88 (79% predicted) and DLCO of 39.3 mL/mmHg/min (100% predicted).  He is undecided about sperm  banking.  He declined evaluation for retroperitoneal lymph node dissection (RPLND).  He declined sperm banking.  He is currently day 8 of cycle #3 BEP (12/30/2015 - 02/24/2016).  He received OnPro Neulasta with cycle #2 and #3.  He has felt achy post Neulasta.  Symptomatically, he feels well.  He has leukocytosis s/p Neulasta.  Plan: 1.  Labs today:  CBC with diff, CMP, Mg. 2.  Day 8 bleomycin today. 3.  RTC on 03/10/2016 for MD (Dr. Janese Banks) assessment, labs (CBC with diff, CMP), and day 15 bleomycin. 4.  Schedule chest, abdomen, and pelvic CT on 03/30/2016. 5.  Labs on day of scan: CBC with diff, CMP, Mg, AFP, beta HCG, LDH 6.  RTC on 03/31/2016 for MD assessment, review of labs and scans.   [name], MD  03/02/2016, 9:23 AM

## 2016-03-02 ENCOUNTER — Inpatient Hospital Stay: Payer: BLUE CROSS/BLUE SHIELD

## 2016-03-02 ENCOUNTER — Inpatient Hospital Stay (HOSPITAL_BASED_OUTPATIENT_CLINIC_OR_DEPARTMENT_OTHER): Payer: BLUE CROSS/BLUE SHIELD | Admitting: Hematology and Oncology

## 2016-03-02 VITALS — BP 117/83 | HR 80 | Temp 97.3°F | Resp 18 | Wt 286.8 lb

## 2016-03-02 DIAGNOSIS — C6212 Malignant neoplasm of descended left testis: Secondary | ICD-10-CM

## 2016-03-02 DIAGNOSIS — F17210 Nicotine dependence, cigarettes, uncomplicated: Secondary | ICD-10-CM

## 2016-03-02 DIAGNOSIS — Z9079 Acquired absence of other genital organ(s): Secondary | ICD-10-CM

## 2016-03-02 DIAGNOSIS — K219 Gastro-esophageal reflux disease without esophagitis: Secondary | ICD-10-CM

## 2016-03-02 DIAGNOSIS — Z5111 Encounter for antineoplastic chemotherapy: Secondary | ICD-10-CM

## 2016-03-02 DIAGNOSIS — Z79899 Other long term (current) drug therapy: Secondary | ICD-10-CM

## 2016-03-02 DIAGNOSIS — Z8781 Personal history of (healed) traumatic fracture: Secondary | ICD-10-CM

## 2016-03-02 DIAGNOSIS — D72829 Elevated white blood cell count, unspecified: Secondary | ICD-10-CM | POA: Diagnosis not present

## 2016-03-02 DIAGNOSIS — D709 Neutropenia, unspecified: Secondary | ICD-10-CM

## 2016-03-02 DIAGNOSIS — Z7689 Persons encountering health services in other specified circumstances: Secondary | ICD-10-CM

## 2016-03-02 DIAGNOSIS — R634 Abnormal weight loss: Secondary | ICD-10-CM

## 2016-03-02 DIAGNOSIS — Z808 Family history of malignant neoplasm of other organs or systems: Secondary | ICD-10-CM

## 2016-03-02 DIAGNOSIS — L709 Acne, unspecified: Secondary | ICD-10-CM

## 2016-03-02 DIAGNOSIS — R110 Nausea: Secondary | ICD-10-CM

## 2016-03-02 DIAGNOSIS — R599 Enlarged lymph nodes, unspecified: Secondary | ICD-10-CM

## 2016-03-02 LAB — CBC WITH DIFFERENTIAL/PLATELET
Basophils Absolute: 0 10*3/uL (ref 0–0.1)
Basophils Relative: 0 %
Eosinophils Absolute: 0 10*3/uL (ref 0–0.7)
Eosinophils Relative: 0 %
HCT: 34.2 % — ABNORMAL LOW (ref 40.0–52.0)
Hemoglobin: 11.1 g/dL — ABNORMAL LOW (ref 13.0–18.0)
Lymphocytes Relative: 2 %
Lymphs Abs: 1.5 10*3/uL (ref 1.0–3.6)
MCH: 29.2 pg (ref 26.0–34.0)
MCHC: 32.4 g/dL (ref 32.0–36.0)
MCV: 90.2 fL (ref 80.0–100.0)
Monocytes Absolute: 0 10*3/uL — ABNORMAL LOW (ref 0.2–1.0)
Monocytes Relative: 0 %
Neutro Abs: 71.6 10*3/uL — ABNORMAL HIGH (ref 1.4–6.5)
Neutrophils Relative %: 98 %
Platelets: 203 10*3/uL (ref 150–440)
RBC: 3.79 MIL/uL — ABNORMAL LOW (ref 4.40–5.90)
RDW: 16.4 % — ABNORMAL HIGH (ref 11.5–14.5)
WBC: 73.1 10*3/uL (ref 3.8–10.6)

## 2016-03-02 LAB — COMPREHENSIVE METABOLIC PANEL
ALT: 13 U/L — ABNORMAL LOW (ref 17–63)
AST: 15 U/L (ref 15–41)
Albumin: 4 g/dL (ref 3.5–5.0)
Alkaline Phosphatase: 50 U/L (ref 38–126)
Anion gap: 6 (ref 5–15)
BUN: 25 mg/dL — ABNORMAL HIGH (ref 6–20)
CO2: 28 mmol/L (ref 22–32)
Calcium: 8.6 mg/dL — ABNORMAL LOW (ref 8.9–10.3)
Chloride: 103 mmol/L (ref 101–111)
Creatinine, Ser: 0.8 mg/dL (ref 0.61–1.24)
GFR calc Af Amer: >60 mL/min (ref >60)
GFR calc non Af Amer: >60 mL/min (ref >60)
Glucose, Bld: 98 mg/dL (ref 65–99)
Potassium: 4 mmol/L (ref 3.5–5.1)
Sodium: 137 mmol/L (ref 135–145)
Total Bilirubin: 0.7 mg/dL (ref 0.3–1.2)
Total Protein: 6.6 g/dL (ref 6.5–8.1)

## 2016-03-02 LAB — MAGNESIUM: Magnesium: 2.1 mg/dL (ref 1.7–2.4)

## 2016-03-02 MED ORDER — SODIUM CHLORIDE 0.9 % IV SOLN
Freq: Once | INTRAVENOUS | Status: AC
  Administered 2016-03-02: 10:00:00 via INTRAVENOUS
  Filled 2016-03-02: qty 1000

## 2016-03-02 MED ORDER — SODIUM CHLORIDE 0.9 % IV SOLN
30.0000 [IU] | Freq: Once | INTRAVENOUS | Status: AC
  Administered 2016-03-02: 30 [IU] via INTRAVENOUS
  Filled 2016-03-02: qty 10

## 2016-03-02 MED ORDER — SODIUM CHLORIDE 0.9 % IJ SOLN
10.0000 mL | Freq: Once | INTRAMUSCULAR | Status: AC
  Administered 2016-03-02: 10 mL via INTRAVENOUS
  Filled 2016-03-02: qty 10

## 2016-03-02 MED ORDER — HEPARIN SOD (PORK) LOCK FLUSH 100 UNIT/ML IV SOLN: 500.0000 [IU] | Freq: Once | INTRAVENOUS | Status: AC

## 2016-03-02 MED ORDER — HEPARIN SOD (PORK) LOCK FLUSH 100 UNIT/ML IV SOLN
500.0000 [IU] | Freq: Once | INTRAVENOUS | Status: AC | PRN
  Administered 2016-03-02: 500 [IU]
  Filled 2016-03-02: qty 5

## 2016-03-02 NOTE — Progress Notes (Signed)
Patient offers no complaints today. 

## 2016-03-10 ENCOUNTER — Inpatient Hospital Stay: Payer: BLUE CROSS/BLUE SHIELD

## 2016-03-10 ENCOUNTER — Other Ambulatory Visit: Payer: Self-pay | Admitting: Hematology and Oncology

## 2016-03-10 ENCOUNTER — Encounter: Payer: Self-pay | Admitting: *Deleted

## 2016-03-10 ENCOUNTER — Encounter: Payer: Self-pay | Admitting: Hematology and Oncology

## 2016-03-10 ENCOUNTER — Inpatient Hospital Stay (HOSPITAL_BASED_OUTPATIENT_CLINIC_OR_DEPARTMENT_OTHER): Payer: BLUE CROSS/BLUE SHIELD | Admitting: Oncology

## 2016-03-10 ENCOUNTER — Encounter: Payer: Self-pay | Admitting: Oncology

## 2016-03-10 VITALS — BP 140/102 | HR 102 | Temp 98.1°F | Ht 74.0 in | Wt 279.2 lb

## 2016-03-10 DIAGNOSIS — Z7689 Persons encountering health services in other specified circumstances: Secondary | ICD-10-CM | POA: Diagnosis not present

## 2016-03-10 DIAGNOSIS — D709 Neutropenia, unspecified: Secondary | ICD-10-CM

## 2016-03-10 DIAGNOSIS — Z79899 Other long term (current) drug therapy: Secondary | ICD-10-CM

## 2016-03-10 DIAGNOSIS — C6212 Malignant neoplasm of descended left testis: Secondary | ICD-10-CM | POA: Diagnosis not present

## 2016-03-10 DIAGNOSIS — R634 Abnormal weight loss: Secondary | ICD-10-CM

## 2016-03-10 DIAGNOSIS — R000 Tachycardia, unspecified: Secondary | ICD-10-CM | POA: Diagnosis not present

## 2016-03-10 DIAGNOSIS — F17210 Nicotine dependence, cigarettes, uncomplicated: Secondary | ICD-10-CM

## 2016-03-10 DIAGNOSIS — K219 Gastro-esophageal reflux disease without esophagitis: Secondary | ICD-10-CM

## 2016-03-10 DIAGNOSIS — R599 Enlarged lymph nodes, unspecified: Secondary | ICD-10-CM

## 2016-03-10 DIAGNOSIS — Z8781 Personal history of (healed) traumatic fracture: Secondary | ICD-10-CM

## 2016-03-10 DIAGNOSIS — Z5111 Encounter for antineoplastic chemotherapy: Secondary | ICD-10-CM

## 2016-03-10 DIAGNOSIS — Z9079 Acquired absence of other genital organ(s): Secondary | ICD-10-CM

## 2016-03-10 DIAGNOSIS — Z808 Family history of malignant neoplasm of other organs or systems: Secondary | ICD-10-CM

## 2016-03-10 DIAGNOSIS — D72829 Elevated white blood cell count, unspecified: Secondary | ICD-10-CM

## 2016-03-10 DIAGNOSIS — L709 Acne, unspecified: Secondary | ICD-10-CM

## 2016-03-10 DIAGNOSIS — R110 Nausea: Secondary | ICD-10-CM

## 2016-03-10 LAB — COMPREHENSIVE METABOLIC PANEL
ALT: 16 U/L — ABNORMAL LOW (ref 17–63)
AST: 14 U/L — ABNORMAL LOW (ref 15–41)
Albumin: 3.8 g/dL (ref 3.5–5.0)
Alkaline Phosphatase: 48 U/L (ref 38–126)
Anion gap: 5 (ref 5–15)
BUN: 13 mg/dL (ref 6–20)
CO2: 26 mmol/L (ref 22–32)
Calcium: 8.7 mg/dL — ABNORMAL LOW (ref 8.9–10.3)
Chloride: 104 mmol/L (ref 101–111)
Creatinine, Ser: 0.87 mg/dL (ref 0.61–1.24)
GFR calc Af Amer: >60 mL/min (ref >60)
GFR calc non Af Amer: >60 mL/min (ref >60)
Glucose, Bld: 106 mg/dL — ABNORMAL HIGH (ref 65–99)
Potassium: 4 mmol/L (ref 3.5–5.1)
Sodium: 135 mmol/L (ref 135–145)
Total Bilirubin: 0.3 mg/dL (ref 0.3–1.2)
Total Protein: 6.6 g/dL (ref 6.5–8.1)

## 2016-03-10 LAB — CBC WITH DIFFERENTIAL/PLATELET
Basophils Absolute: 0 10*3/uL (ref 0–0.1)
Basophils Relative: 1 %
Eosinophils Absolute: 0.1 10*3/uL (ref 0–0.7)
Eosinophils Relative: 1 %
HCT: 31.5 % — ABNORMAL LOW (ref 40.0–52.0)
Hemoglobin: 10.6 g/dL — ABNORMAL LOW (ref 13.0–18.0)
Lymphocytes Relative: 18 %
Lymphs Abs: 1 10*3/uL (ref 1.0–3.6)
MCH: 30.2 pg (ref 26.0–34.0)
MCHC: 33.7 g/dL (ref 32.0–36.0)
MCV: 89.6 fL (ref 80.0–100.0)
Monocytes Absolute: 0.6 10*3/uL (ref 0.2–1.0)
Monocytes Relative: 11 %
Neutro Abs: 3.9 10*3/uL (ref 1.4–6.5)
Neutrophils Relative %: 69 %
Platelets: 114 10*3/uL — ABNORMAL LOW (ref 150–440)
RBC: 3.52 MIL/uL — ABNORMAL LOW (ref 4.40–5.90)
RDW: 16.5 % — ABNORMAL HIGH (ref 11.5–14.5)
WBC: 5.6 10*3/uL (ref 3.8–10.6)

## 2016-03-10 LAB — MAGNESIUM: Magnesium: 1.9 mg/dL (ref 1.7–2.4)

## 2016-03-10 MED ORDER — SODIUM CHLORIDE 0.9 % IV SOLN
30.0000 [IU] | Freq: Once | INTRAVENOUS | Status: AC
  Administered 2016-03-10: 30 [IU] via INTRAVENOUS
  Filled 2016-03-10: qty 10

## 2016-03-10 MED ORDER — SODIUM CHLORIDE 0.9% FLUSH
10.0000 mL | INTRAVENOUS | Status: DC | PRN
  Administered 2016-03-10: 10 mL
  Filled 2016-03-10: qty 10

## 2016-03-10 MED ORDER — SODIUM CHLORIDE 0.9 % IV SOLN
Freq: Once | INTRAVENOUS | Status: AC
  Administered 2016-03-10: 10:00:00 via INTRAVENOUS
  Filled 2016-03-10: qty 1000

## 2016-03-10 MED ORDER — ONDANSETRON 8 MG PO TBDP
8.0000 mg | ORAL_TABLET | Freq: Once | ORAL | Status: AC
  Administered 2016-03-10: 8 mg via ORAL
  Filled 2016-03-10: qty 1

## 2016-03-10 MED ORDER — HEPARIN SOD (PORK) LOCK FLUSH 100 UNIT/ML IV SOLN
500.0000 [IU] | Freq: Once | INTRAVENOUS | Status: AC | PRN
  Administered 2016-03-10: 500 [IU]
  Filled 2016-03-10: qty 5

## 2016-03-10 NOTE — Progress Notes (Signed)
Patient here today for follow up on Malignant neoplasm of descended left testis.  Patient states no new concerns today

## 2016-03-10 NOTE — Progress Notes (Signed)
Hematology/Oncology Consult note [facility]  [phone] [phone]  Patient Care Team: No Pcp Per Patient as PCP - General (General Practice)   Name of the patient: Edgar [name]  KH:[id number]  05-24-1990   Date of visit: 03/10/16  Diagnosis- stage IIB left testicular cancer  Chief complaint/ Reason for visit- on treatment assessment for day 15 cycle #3 of BEP  Heme/Onc history: ps is a 25 y.o. male with stage IIB left testicular cancer s/p left radical orchiectomy on 11/26/2015.  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  Clinical/pathologic stage is T1 N1-2 S0 M0.  Pre surgery labs on 11/20/2015 revealed an AFP of 411.9, beta-HCG 2,669, and LDH 522 (121-224).    Tumor markers have been followed:  AFP was 411.9 (0-8.3) on 11/20/2015, 11.2 on 12/19/2015, 6.3 on 12/23/2015, 1.3 on 01/27/2016, and 1.8 on 02/17/2016.  Beta-HCG was 2,669 (0-3) on 11/20/2015, 2 on 12/19/2015, 1 on 12/23/2015, < 1 on 01/27/2016, and < 1 on 02/17/2016.  LDH was 522 (98-192) on 11/20/2015, 179 on 12/19/2015, 160 on 12/23/2015, 202 on 01/27/2016, 156 on 02/10/2016, and 161 on 02/17/2016.  Chest, abdomen, and pelvic CT scan on 11/28/2015 revealed no mediastinal adenopathy or suspicious pulmonary nodules.   There were 2 prominent left periaortic retroperitoneal lymph nodes (1.7 cm and 0.9 cm) concerning for testicular nodal metastasis.  There were no abnormal lymph nodes above the renal veins.  There were postsurgical change in the left hemiscrotum and left inguinal canal.  There was a 3.3 cm soft tissue abnormality anterior to the left iliopsoas muscle which could represent a metastatic lymph node (N2 lesion).  Head MRI on 12/17/2015 revealed no evidence of metastatic disease.    Pulmonary function testing revealed a FVC 4.71 liters (76% predicted),  FEV1 4.18 liters (88% predicted), TLC 7.23 liters (87% predicted), VC 4.88 (79% predicted) and DLCO of 39.3 mL/mmHg/min (100% predicted).  He is undecided about sperm banking.  He declined evaluation for retroperitoneal lymph node dissection (RPLND).  He declined sperm banking.  He is currently day 8 of cycle #3 BEP (12/30/2015 - 02/24/2016).  He received OnPro Neulasta with cycle #2 and #3.  He has felt achy post Neulasta.  Interval history- is tolerating his chemotherapy well. He has occasional nausea which is well controlled. Also reports some body aches after Neulasta which is well controlled with Claritin.  ECOG PS- 0 Pain scale- 0 Opioid associated constipation- no  Review of systems- Review of Systems  Constitutional: Negative for chills, fever, malaise/fatigue and weight loss.  HENT: Negative for congestion, ear discharge and nosebleeds.   Eyes: Negative for blurred vision.  Respiratory: Negative for cough, hemoptysis, sputum production, shortness of breath and wheezing.   Cardiovascular: Negative for chest pain, palpitations, orthopnea and claudication.  Gastrointestinal: Negative for abdominal pain, blood in stool, constipation, diarrhea, heartburn, melena, nausea and vomiting.  Genitourinary: Negative for dysuria, flank pain, frequency, hematuria and urgency.  Musculoskeletal: Negative for back pain, joint pain and myalgias.  Skin: Negative for rash.  Neurological: Negative for dizziness, tingling, focal weakness, seizures, weakness and headaches.  Endo/Heme/Allergies: Does not bruise/bleed easily.  Psychiatric/Behavioral: Negative for depression and suicidal ideas. The patient does not have insomnia.      Current treatment- BEP chemotherapy  No Known Allergies   Past Medical History:  Diagnosis Date  . Asthma    AS A CHILD-NO  INHALERS  . GERD (gastroesophageal reflux disease)    TUMS PRN     Past Surgical History:  Procedure Laterality Date  . ANKLE FRACTURE  SURGERY Right 2004  . ORCHIECTOMY Left 11/26/2015   Procedure: ORCHIECTOMY;  Surgeon: [name], MD;  Location: [facility] OR[facility];  Service: Urology;  Laterality: Left;  radical/Inguinal approach  . PERIPHERAL VASCULAR CATHETERIZATION N/A 12/16/2015   Procedure: [name] Cath Insertion;  Surgeon: [name], MD;  Location: [facility];  Service: Cardiovascular;  Laterality: N/A;  . WISDOM TOOTH EXTRACTION  2017    Social History   Social History  . Marital status: Married    Spouse name: N/A  . Number of children: N/A  . Years of education: N/A   Occupational History  . Not on file.   Social History Main Topics  . Smoking status: Current Every Day Smoker    Packs/day: 0.50    Years: 8.00    Types: Cigarettes  . Smokeless tobacco: Former Systems developer    Types: Snuff  . Alcohol use No  . Drug use:     Types: Marijuana  . Sexual activity: Not on file   Other Topics Concern  . Not on file   Social History Narrative  . No narrative on file    Family History  Problem Relation Age of Onset  . Skin cancer Mother   . Breast cancer Maternal Grandmother   . Colon cancer Maternal Grandfather   . Diabetes Maternal Grandfather   . Prostate cancer Neg Hx   . Kidney cancer Neg Hx   . Bladder Cancer Neg Hx      Current Outpatient Prescriptions:  .  acetaminophen (TYLENOL) 325 MG tablet, Take 650 mg by mouth every 6 (six) hours as needed., Disp: , Rfl:  .  dexamethasone (DECADRON) 4 MG tablet, Take 2 tablets by mouth once a day on the day starting on day 6 of chemotherapy and then take 2 tablets two times a day for 2 days. Take with food., Disp: 30 tablet, Rfl: 1 .  docusate sodium (COLACE) 100 MG capsule, Take 100 mg by mouth 2 (two) times daily as needed. , Disp: , Rfl: 0 .  lidocaine-prilocaine (EMLA) cream, Apply small amount of cream over the portacath site 1 hour before chemotherapy treatment, place saran wrap over the cream to protect your clothing, Disp: 30 g, Rfl: 1 .   LORazepam (ATIVAN) 0.5 MG tablet, Take 1 tablet (0.5 mg total) by mouth every 6 (six) hours as needed (Nausea or vomiting)., Disp: 30 tablet, Rfl: 0 .  ondansetron (ZOFRAN) 8 MG tablet, Take 1 tablet (8 mg total) by mouth 2 (two) times daily as needed. Start on day 8 of chemotherapy., Disp: 30 tablet, Rfl: 1  Physical exam:  Vitals:   03/10/16 0907  BP: (!) 140/102  Pulse: (!) 102  Temp: 98.1 F (36.7 C)  TempSrc: Oral  Weight: 279 lb 3.4 oz (126.6 kg)  Height: 6\' 2"  (1.88 m)   Physical Exam  Constitutional: He is oriented to person, place, and time and well-developed, well-nourished, and in no distress.  HENT:  Head: Normocephalic and atraumatic.  Eyes: EOM are normal. Pupils are equal, round, and reactive to light.  Neck: Normal range of motion.  Cardiovascular: Normal rate, regular rhythm and normal heart sounds.   Pulmonary/Chest: Effort normal and breath sounds normal.  Abdominal: Soft. Bowel sounds are normal.  Neurological: He is alert and oriented to person, place, and time.  Skin: Skin  is warm and dry.     CMP Latest Ref Rng & Units 03/02/2016  Glucose 65 - 99 mg/dL 98  BUN 6 - 20 mg/dL 25(H)  Creatinine 0.61 - 1.24 mg/dL 0.80  Sodium 135 - 145 mmol/L 137  Potassium 3.5 - 5.1 mmol/L 4.0  Chloride 101 - 111 mmol/L 103  CO2 22 - 32 mmol/L 28  Calcium 8.9 - 10.3 mg/dL 8.6(L)  Total Protein 6.5 - 8.1 g/dL 6.6  Total Bilirubin 0.3 - 1.2 mg/dL 0.7  Alkaline Phos 38 - 126 U/L 50  AST 15 - 41 U/L 15  ALT 17 - 63 U/L 13(L)   CBC Latest Ref Rng & Units 03/02/2016  WBC 3.8 - 10.6 K/uL 73.1(HH)  Hemoglobin 13.0 - 18.0 g/dL 11.1(L)  Hematocrit 40.0 - 52.0 % 34.2(L)  Platelets 150 - 440 K/uL 203      Assessment and plan- Patient is a 25 y.o. male with a history of stage II B testicular cancer status post left radical orchiectomy currently undergoing adjuvant BEP chemotherapy  1. Counts are okay to proceed with cycle #3 day 15 of BEP chemotherapy. He will receive  Neulasta today. He is scheduled for CT chest abdomen and pelvis on 03/30/2016 and will see Dr. Mike [name] on 03/31/2016 with labs and to discuss the results of the scans  2. Patient did have significant leukocytosis with a white count of 73 on 03/02/2016 secondary to Neulasta which has now normalized. He will not be getting any Neulasta today  3. Sinus tachycardia- continue to monitor    Visit Diagnosis 1. Malignant neoplasm of descended left testis [facility])      Dr. Randa Evens, MD, MPH [facility] at [facility] Pager- [phone] 03/10/2016 7:58 AM

## 2016-03-30 ENCOUNTER — Ambulatory Visit
Admission: RE | Admit: 2016-03-30 | Discharge: 2016-03-30 | Disposition: A | Discharge status: Home / Self Care | Payer: BLUE CROSS/BLUE SHIELD | Source: Ambulatory Visit | Attending: Hematology and Oncology | Admitting: Hematology and Oncology

## 2016-03-30 ENCOUNTER — Inpatient Hospital Stay: Payer: BLUE CROSS/BLUE SHIELD | Attending: Hematology and Oncology

## 2016-03-30 DIAGNOSIS — F17210 Nicotine dependence, cigarettes, uncomplicated: Secondary | ICD-10-CM | POA: Insufficient documentation

## 2016-03-30 DIAGNOSIS — R110 Nausea: Secondary | ICD-10-CM | POA: Insufficient documentation

## 2016-03-30 DIAGNOSIS — Z9221 Personal history of antineoplastic chemotherapy: Secondary | ICD-10-CM | POA: Insufficient documentation

## 2016-03-30 DIAGNOSIS — Z79899 Other long term (current) drug therapy: Secondary | ICD-10-CM | POA: Diagnosis not present

## 2016-03-30 DIAGNOSIS — C6212 Malignant neoplasm of descended left testis: Secondary | ICD-10-CM | POA: Diagnosis not present

## 2016-03-30 DIAGNOSIS — R490 Dysphonia: Secondary | ICD-10-CM | POA: Diagnosis not present

## 2016-03-30 DIAGNOSIS — K219 Gastro-esophageal reflux disease without esophagitis: Secondary | ICD-10-CM | POA: Insufficient documentation

## 2016-03-30 DIAGNOSIS — Z800 Family history of malignant neoplasm of digestive organs: Secondary | ICD-10-CM | POA: Diagnosis not present

## 2016-03-30 DIAGNOSIS — Z5111 Encounter for antineoplastic chemotherapy: Secondary | ICD-10-CM

## 2016-03-30 DIAGNOSIS — C6292 Malignant neoplasm of left testis, unspecified whether descended or undescended: Secondary | ICD-10-CM | POA: Insufficient documentation

## 2016-03-30 DIAGNOSIS — Z803 Family history of malignant neoplasm of breast: Secondary | ICD-10-CM | POA: Diagnosis not present

## 2016-03-30 DIAGNOSIS — Z85828 Personal history of other malignant neoplasm of skin: Secondary | ICD-10-CM | POA: Diagnosis not present

## 2016-03-30 LAB — CBC WITH DIFFERENTIAL/PLATELET
Basophils Absolute: 0 10*3/uL (ref 0–0.1)
Basophils Relative: 1 %
Eosinophils Absolute: 0.3 10*3/uL (ref 0–0.7)
Eosinophils Relative: 4 %
HCT: 36.9 % — ABNORMAL LOW (ref 40.0–52.0)
Hemoglobin: 12.2 g/dL — ABNORMAL LOW (ref 13.0–18.0)
Lymphocytes Relative: 14 %
Lymphs Abs: 1 10*3/uL (ref 1.0–3.6)
MCH: 30.1 pg (ref 26.0–34.0)
MCHC: 33.1 g/dL (ref 32.0–36.0)
MCV: 91 fL (ref 80.0–100.0)
Monocytes Absolute: 0.9 10*3/uL (ref 0.2–1.0)
Monocytes Relative: 12 %
Neutro Abs: 5 10*3/uL (ref 1.4–6.5)
Neutrophils Relative %: 69 %
Platelets: 335 10*3/uL (ref 150–440)
RBC: 4.06 MIL/uL — ABNORMAL LOW (ref 4.40–5.90)
RDW: 16.4 % — ABNORMAL HIGH (ref 11.5–14.5)
WBC: 7.2 10*3/uL (ref 3.8–10.6)

## 2016-03-30 LAB — COMPREHENSIVE METABOLIC PANEL
ALT: 32 U/L (ref 17–63)
AST: 29 U/L (ref 15–41)
Albumin: 4.3 g/dL (ref 3.5–5.0)
Alkaline Phosphatase: 48 U/L (ref 38–126)
Anion gap: 9 (ref 5–15)
BUN: 15 mg/dL (ref 6–20)
CO2: 25 mmol/L (ref 22–32)
Calcium: 9.1 mg/dL (ref 8.9–10.3)
Chloride: 104 mmol/L (ref 101–111)
Creatinine, Ser: 0.82 mg/dL (ref 0.61–1.24)
GFR calc Af Amer: >60 mL/min (ref >60)
GFR calc non Af Amer: >60 mL/min (ref >60)
Glucose, Bld: 98 mg/dL (ref 65–99)
Potassium: 4.1 mmol/L (ref 3.5–5.1)
Sodium: 138 mmol/L (ref 135–145)
Total Bilirubin: 0.8 mg/dL (ref 0.3–1.2)
Total Protein: 7.3 g/dL (ref 6.5–8.1)

## 2016-03-30 LAB — LACTATE DEHYDROGENASE: LDH: 172 U/L (ref 98–192)

## 2016-03-30 LAB — MAGNESIUM: Magnesium: 1.9 mg/dL (ref 1.7–2.4)

## 2016-03-30 MED ORDER — IOPAMIDOL (ISOVUE-300) INJECTION 61%
125.0000 mL | Freq: Once | INTRAVENOUS | Status: AC | PRN
  Administered 2016-03-30: 125 mL via INTRAVENOUS

## 2016-03-31 ENCOUNTER — Inpatient Hospital Stay (HOSPITAL_BASED_OUTPATIENT_CLINIC_OR_DEPARTMENT_OTHER): Payer: BLUE CROSS/BLUE SHIELD | Admitting: Hematology and Oncology

## 2016-03-31 VITALS — BP 138/89 | HR 98 | Temp 96.8°F | Resp 18 | Wt 276.7 lb

## 2016-03-31 DIAGNOSIS — Z800 Family history of malignant neoplasm of digestive organs: Secondary | ICD-10-CM

## 2016-03-31 DIAGNOSIS — C6292 Malignant neoplasm of left testis, unspecified whether descended or undescended: Secondary | ICD-10-CM

## 2016-03-31 DIAGNOSIS — K219 Gastro-esophageal reflux disease without esophagitis: Secondary | ICD-10-CM

## 2016-03-31 DIAGNOSIS — Z85828 Personal history of other malignant neoplasm of skin: Secondary | ICD-10-CM

## 2016-03-31 DIAGNOSIS — Z9221 Personal history of antineoplastic chemotherapy: Secondary | ICD-10-CM

## 2016-03-31 DIAGNOSIS — Z79899 Other long term (current) drug therapy: Secondary | ICD-10-CM | POA: Diagnosis not present

## 2016-03-31 DIAGNOSIS — C6212 Malignant neoplasm of descended left testis: Secondary | ICD-10-CM

## 2016-03-31 DIAGNOSIS — Z803 Family history of malignant neoplasm of breast: Secondary | ICD-10-CM

## 2016-03-31 DIAGNOSIS — F17210 Nicotine dependence, cigarettes, uncomplicated: Secondary | ICD-10-CM | POA: Diagnosis not present

## 2016-03-31 DIAGNOSIS — R110 Nausea: Secondary | ICD-10-CM | POA: Diagnosis not present

## 2016-03-31 DIAGNOSIS — R490 Dysphonia: Secondary | ICD-10-CM | POA: Diagnosis not present

## 2016-03-31 LAB — AFP TUMOR MARKER: AFP-Tumor Marker: 1.9 ng/mL (ref 0.0–8.3)

## 2016-03-31 NOTE — Progress Notes (Signed)
[place] [facility] day:  03/31/2016  Chief Complaint: Edgar [name] is a 26 y.o. male with stage IIB testicular cancer who is seen for review of CT scans after 3 cycles of BEP.  HPI: The patient was last seen in the medical oncology clinic by me on 03/02/2016.  At that time, he was doing well.  He received day 8 of cycle #3 BEP.  He saw Dr. Janese Banks in my absence on 03/10/2016.  He received day 15 bleomycin.  Chest, abdomen, and pelvic CT scan on 03/30/2016 revealed interval resolution of left abdominal/pelvic lymphadenopathy.  There was no residual metastatic disease.  Labs on 03/30/2016 revealed a hematocrit of 36.9, hemoglobin 12.2, MCV 91, platelets 335,000, WBC 7200 with an ANC of 5000.   Comprehensive metabolic panel, magnesium, and LDH were normal.  AFP and beta-HCG are pending.  Symptomatically, he had a little nausea last week.  He went back to work last week.  He is a little hoarse today.  He is smoking 1/2 pack a day.   Past Medical History:  Diagnosis Date  . Asthma    AS A CHILD-NO INHALERS  . GERD (gastroesophageal reflux disease)    TUMS PRN    Past Surgical History:  Procedure Laterality Date  . ANKLE FRACTURE SURGERY Right 2004  . ORCHIECTOMY Left 11/26/2015   Procedure: ORCHIECTOMY;  Surgeon: [name], MD;  Location: [facility];  Service: Urology;  Laterality: Left;  radical/Inguinal approach  . PERIPHERAL VASCULAR CATHETERIZATION N/A 12/16/2015   Procedure: [name] Cath Insertion;  Surgeon: [name], MD;  Location: [facility];  Service: Cardiovascular;  Laterality: N/A;  . WISDOM TOOTH EXTRACTION  2017    Family History  Problem Relation Age of Onset  . Skin cancer Mother   . Breast cancer Maternal Grandmother   . Colon cancer Maternal Grandfather   . Diabetes Maternal Grandfather   . Prostate cancer Neg Hx   . Kidney cancer Neg Hx   . Bladder Cancer Neg Hx     Social History:  reports that he has been  smoking Cigarettes.  He has a 4.00 pack-year smoking history. He has quit using smokeless tobacco. His smokeless tobacco use included Snuff. He reports that he uses drugs, including Marijuana. He reports that he does not drink alcohol.  He smokes 1/3 pack a day.  He smokes marijuana.  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  He has a 7 year-old-daughter named [name]. The patient's mother's cell phone number is [phone].  His grandmother recently died.  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  The patient lives in [place].  Allergies: No Known Allergies  Current Medications: Current Outpatient Prescriptions  Medication Sig Dispense Refill  . acetaminophen (TYLENOL) 325 MG tablet Take 650 mg by mouth every 6 (six) hours as needed.    Marland Kitchen dexamethasone (DECADRON) 4 MG tablet Take 2 tablets by mouth once a day on the day starting on day 6 of chemotherapy and then take 2 tablets two times a day for 2 days. Take with food. 30 tablet 1  . docusate sodium (COLACE) 100 MG capsule Take 100 mg by mouth 2 (two) times daily as needed.   0  . lidocaine-prilocaine (EMLA) cream Apply small amount of cream over the portacath site 1 hour before chemotherapy treatment, place saran wrap over the cream to protect your clothing 30 g 1  .  LORazepam (ATIVAN) 0.5 MG tablet Take 1 tablet (0.5 mg total) by mouth every 6 (six) hours as needed (Nausea or vomiting). 30 tablet 0  . ondansetron (ZOFRAN) 8 MG tablet Take 1 tablet (8 mg total) by mouth 2 (two) times daily as needed. Start on day 8 of chemotherapy. 30 tablet 1   No current facility-administered medications for this visit.     Review of Systems:  GENERAL:  Feels good.  No fevers or sweats.  Weight down 10 pounds. PERFORMANCE STATUS (ECOG):  0 HEENT:  Little hoarse.  No visual changes, runny nose, sore throat, mouth sores or tenderness. Lungs: No shortness of breath.  No cough.  No hemoptysis. Cardiac:  No chest pain,  palpitations, orthopnea, or PND. GI:  Little nausea last week.  Novomiting, diarrhea, constipation, melena or hematochezia. GU:  No urgency, frequency, dysuria, or hematuria. Musculoskeletal:  No back pain.  No joint pain.  No muscle tenderness. Extremities:  No pain or swelling. Skin:  No rashes, ulcers, or skin changes. Neuro:  No headache, numbness or weakness, balance or coordination issues. Endocrine:  No diabetes, thyroid issues, hot flashes or night sweats. Psych:  No mood changes, depression or anxiety. Pain:  No pain. Review of systems:  All other systems reviewed and found to be negative.  Physical Exam: 266 Blood pressure 138/89, pulse 98, temperature (!) 96.8 F (36 C), temperature source Tympanic, resp. rate 18, weight 276 lb 10.8 oz (125.5 kg). GENERAL:  Well developed, well nourished, gentleman sitting comfortably in the exam room in no acute distress. MENTAL STATUS:  Alert and oriented to person, place and time. HEAD:  Wearing a cap.  Alopecia totalis.  Normocephalic, atraumatic, face symmetric, no Cushingoid features. EYES:  Pupils equal round and reactive to light and accomodation.  No conjunctivitis or scleral icterus. ENT:  Oropharynx clear without lesion.  Tongue normal. Mucous membranes moist.  RESPIRATORY:  Clear to auscultation without rales, wheezes or rhonchi. CARDIOVASCULAR:  Regular rate and rhythm without murmur, rub or gallop. ABDOMEN:  Soft, non-tender, with active bowel sounds, and no appreciable hepatosplenomegaly.  No masses. GU:  Right testicle without masses or tenderness.  s/p left orchiectomy with well healed inguinal incision and resolving hematoma in left hemiscrotum. SKIN:  No rashes, ulcers or lesions. EXTREMITIES: No edema, no skin discoloration or tenderness.  No palpable cords. LYMPH NODES: No palpable cervical, supraclavicular, axillary or inguinal adenopathy  NEUROLOGICAL: Unremarkable. PSYCH:  Appropriate.    Appointment on 03/30/2016   Component Date Value Ref Range Status  . WBC 03/30/2016 7.2  3.8 - 10.6 K/uL Final  . RBC 03/30/2016 4.06* 4.40 - 5.90 MIL/uL Final  . Hemoglobin 03/30/2016 12.2* 13.0 - 18.0 g/dL Final  . HCT 03/30/2016 36.9* 40.0 - 52.0 % Final  . MCV 03/30/2016 91.0  80.0 - 100.0 fL Final  . MCH 03/30/2016 30.1  26.0 - 34.0 pg Final  . MCHC 03/30/2016 33.1  32.0 - 36.0 g/dL Final  . RDW 03/30/2016 16.4* 11.5 - 14.5 % Final  . Platelets 03/30/2016 335  150 - 440 K/uL Final  . Neutrophils Relative % 03/30/2016 69  % Final  . Neutro Abs 03/30/2016 5.0  1.4 - 6.5 K/uL Final  . Lymphocytes Relative 03/30/2016 14  % Final  . Lymphs Abs 03/30/2016 1.0  1.0 - 3.6 K/uL Final  . Monocytes Relative 03/30/2016 12  % Final  . Monocytes Absolute 03/30/2016 0.9  0.2 - 1.0 K/uL Final  . Eosinophils Relative 03/30/2016 4  %  Final  . Eosinophils Absolute 03/30/2016 0.3  0 - 0.7 K/uL Final  . Basophils Relative 03/30/2016 1  % Final  . Basophils Absolute 03/30/2016 0.0  0 - 0.1 K/uL Final  . Sodium 03/30/2016 138  135 - 145 mmol/L Final  . Potassium 03/30/2016 4.1  3.5 - 5.1 mmol/L Final  . Chloride 03/30/2016 104  101 - 111 mmol/L Final  . CO2 03/30/2016 25  22 - 32 mmol/L Final  . Glucose, Bld 03/30/2016 98  65 - 99 mg/dL Final  . BUN 03/30/2016 15  6 - 20 mg/dL Final  . Creatinine, Ser 03/30/2016 0.82  0.61 - 1.24 mg/dL Final  . Calcium 03/30/2016 9.1  8.9 - 10.3 mg/dL Final  . Total Protein 03/30/2016 7.3  6.5 - 8.1 g/dL Final  . Albumin 03/30/2016 4.3  3.5 - 5.0 g/dL Final  . AST 03/30/2016 29  15 - 41 U/L Final  . ALT 03/30/2016 32  17 - 63 U/L Final  . Alkaline Phosphatase 03/30/2016 48  38 - 126 U/L Final  . Total Bilirubin 03/30/2016 0.8  0.3 - 1.2 mg/dL Final  . GFR calc non Af Amer 03/30/2016 >60  >60 mL/min Final  . GFR calc Af Amer 03/30/2016 >60  >60 mL/min Final   Comment: (NOTE) The eGFR has been calculated using the CKD EPI equation. This calculation has not been validated in all clinical  situations. eGFR's persistently <60 mL/min signify possible Chronic Kidney Disease.   . Anion gap 03/30/2016 9  5 - 15 Final  . Magnesium 03/30/2016 1.9  1.7 - 2.4 mg/dL Final  . AFP-Tumor Marker 03/31/2016 1.9  0.0 - 8.3 ng/mL Final   Comment: (NOTE) [employer] ECLIA methodology Performed At: [facility] [place], [place] [id number] Lindon Romp MD IP:[id number]   . LDH 03/30/2016 172  98 - 192 U/L Final    Assessment:  Edgar [name] is a 26 y.o. male with stage IIB left testicular cancer s/p left radical orchiectomy on 11/26/2015.  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  Clinical/pathologic stage is T1 N1-2 S0 M0.  Pre surgery labs on 11/20/2015 revealed an AFP of 411.9, beta-HCG 2,669, and LDH 522 (121-224).    Tumor markers have been followed:  AFP was 411.9 (0-8.3) on 11/20/2015, 11.2 on 12/19/2015, 6.3 on 12/23/2015, 1.3 on 01/27/2016, 1.8 on 02/17/2016, and 1.9 on 03/30/2016.  Beta-HCG was 2,669 (0-3) on 11/20/2015, 2 on 12/19/2015, 1 on 12/23/2015, < 1 on 01/27/2016, < 1 on 02/17/2016, and < 1 on 03/30/2016.  LDH was 522 (98-192) on 11/20/2015, 179 on 12/19/2015, 160 on 12/23/2015, 202 on 01/27/2016, 156 on 02/10/2016, 161 on 02/17/2016, and 172 on 03/30/2016.  Chest, abdomen, and pelvic CT scan on 11/28/2015 revealed no mediastinal adenopathy or suspicious pulmonary nodules.   There were 2 prominent left periaortic retroperitoneal lymph nodes (1.7 cm and 0.9 cm) concerning for testicular nodal metastasis.  There were no abnormal lymph nodes above the renal veins.  There were postsurgical change in the left hemiscrotum and left inguinal canal.  There was a 3.3 cm soft tissue abnormality anterior to the left iliopsoas muscle which could represent a metastatic lymph node (N2 lesion).  Head MRI on 12/17/2015 revealed no evidence of metastatic  disease.    Chest, abdomen, and pelvic CT scan on 03/30/2016 revealed interval resolution of left abdominal/pelvic lymphadenopathy.  There was  no residual metastatic disease.  Pulmonary function testing revealed a FVC 4.71 liters (76% predicted), FEV1 4.18 liters (88% predicted), TLC 7.23 liters (87% predicted), VC 4.88 (79% predicted) and DLCO of 39.3 mL/mmHg/min (100% predicted).  He is undecided about sperm banking.  He declined evaluation for retroperitoneal lymph node dissection (RPLND).  He declined sperm banking.  He received 3 cycles of  BEP (12/30/2015 - 02/24/2016).  He received OnPro Neulasta with cycle #2 and #3.   Symptomatically, he feels well.  Exam is unremakable.  Plan: 1.  Review labs and scans from yesterday.  CT scan reveals resolution of adenopathy.  No metastatic disease. 2.  Discuss plans for surveillance in year 1 (H&P every 2 months, abdomen/pelvic CT scan every 6 months , CXR every 6 months), year 2 (H&P every 3 months, abdomen/pelvic CT scan every 6-12 months , CXR every 6 months), year 3 (H&P every 6 months, abdomen/pelvic CT scan every year , CXR every year), year 4 (H&P every 6 months, CXR every year), and year 5 (H&P every 6 months). 3.  Follow-up AFP and beta-HCG.  Call patient with results. 4.  Port removal with Dr. Lucky Cowboy. 5.  Encourage smoking cessation. 6.  RTC in 2 months for MD assessment and labs (CBC with diff, CMP, LDH, AFP, beta-HCG).   [name], MD  03/31/2016, 9:00 AM

## 2016-03-31 NOTE — Progress Notes (Signed)
Patient here today for CT results.  States he had some nausea last week.  Offers no complaints today.

## 2016-04-05 LAB — BETA HCG QUANT (REF LAB): Beta hCG, Tumor Marker: <1 m[IU]/mL (ref 0–3)

## 2016-04-06 ENCOUNTER — Telehealth: Payer: Self-pay | Admitting: *Deleted

## 2016-04-06 NOTE — Telephone Encounter (Signed)
-----   Message from [name], MD sent at 04/05/2016 11:07 AM EST ----- Regarding: Please call patient with results   ----- Message ----- From: Interface, Lab In [place] Sent: 03/30/2016   9:03 AM To: [name], MD

## 2016-04-06 NOTE — Telephone Encounter (Signed)
Called patient and LVM that methodology is <1.  Advised to call back if he has questions.

## 2016-05-17 ENCOUNTER — Encounter: Payer: Self-pay | Admitting: Hematology and Oncology

## 2016-05-27 ENCOUNTER — Inpatient Hospital Stay: Payer: BLUE CROSS/BLUE SHIELD

## 2016-05-27 ENCOUNTER — Other Ambulatory Visit: Payer: Self-pay | Admitting: *Deleted

## 2016-05-27 ENCOUNTER — Encounter: Payer: Self-pay | Admitting: Hematology and Oncology

## 2016-05-27 ENCOUNTER — Inpatient Hospital Stay (HOSPITAL_BASED_OUTPATIENT_CLINIC_OR_DEPARTMENT_OTHER): Payer: BLUE CROSS/BLUE SHIELD | Admitting: Hematology and Oncology

## 2016-05-27 ENCOUNTER — Inpatient Hospital Stay: Payer: BLUE CROSS/BLUE SHIELD | Attending: Hematology and Oncology

## 2016-05-27 ENCOUNTER — Telehealth (INDEPENDENT_AMBULATORY_CARE_PROVIDER_SITE_OTHER): Payer: Self-pay

## 2016-05-27 VITALS — BP 125/83 | HR 79 | Temp 97.2°F | Ht 75.0 in | Wt 259.4 lb

## 2016-05-27 DIAGNOSIS — Z95828 Presence of other vascular implants and grafts: Secondary | ICD-10-CM

## 2016-05-27 DIAGNOSIS — Z803 Family history of malignant neoplasm of breast: Secondary | ICD-10-CM | POA: Insufficient documentation

## 2016-05-27 DIAGNOSIS — C6212 Malignant neoplasm of descended left testis: Secondary | ICD-10-CM

## 2016-05-27 DIAGNOSIS — Z452 Encounter for adjustment and management of vascular access device: Secondary | ICD-10-CM | POA: Diagnosis not present

## 2016-05-27 DIAGNOSIS — Z79899 Other long term (current) drug therapy: Secondary | ICD-10-CM | POA: Diagnosis not present

## 2016-05-27 DIAGNOSIS — K219 Gastro-esophageal reflux disease without esophagitis: Secondary | ICD-10-CM

## 2016-05-27 DIAGNOSIS — Z800 Family history of malignant neoplasm of digestive organs: Secondary | ICD-10-CM | POA: Insufficient documentation

## 2016-05-27 DIAGNOSIS — Z808 Family history of malignant neoplasm of other organs or systems: Secondary | ICD-10-CM | POA: Diagnosis not present

## 2016-05-27 DIAGNOSIS — G629 Polyneuropathy, unspecified: Secondary | ICD-10-CM | POA: Insufficient documentation

## 2016-05-27 DIAGNOSIS — F17210 Nicotine dependence, cigarettes, uncomplicated: Secondary | ICD-10-CM

## 2016-05-27 DIAGNOSIS — Z8547 Personal history of malignant neoplasm of testis: Secondary | ICD-10-CM

## 2016-05-27 LAB — COMPREHENSIVE METABOLIC PANEL
ALT: 22 U/L (ref 17–63)
AST: 24 U/L (ref 15–41)
Albumin: 4.6 g/dL (ref 3.5–5.0)
Alkaline Phosphatase: 56 U/L (ref 38–126)
Anion gap: 9 (ref 5–15)
BUN: 19 mg/dL (ref 6–20)
CO2: 28 mmol/L (ref 22–32)
Calcium: 9.4 mg/dL (ref 8.9–10.3)
Chloride: 103 mmol/L (ref 101–111)
Creatinine, Ser: 1.08 mg/dL (ref 0.61–1.24)
GFR calc Af Amer: >60 mL/min (ref >60)
GFR calc non Af Amer: >60 mL/min (ref >60)
Glucose, Bld: 95 mg/dL (ref 65–99)
Potassium: 4.4 mmol/L (ref 3.5–5.1)
Sodium: 140 mmol/L (ref 135–145)
Total Bilirubin: 0.2 mg/dL — ABNORMAL LOW (ref 0.3–1.2)
Total Protein: 7.6 g/dL (ref 6.5–8.1)

## 2016-05-27 LAB — CBC WITH DIFFERENTIAL/PLATELET
Basophils Absolute: 0 10*3/uL (ref 0–0.1)
Basophils Relative: 0 %
Eosinophils Absolute: 0.4 10*3/uL (ref 0–0.7)
Eosinophils Relative: 4 %
HCT: 41.4 % (ref 40.0–52.0)
Hemoglobin: 13.6 g/dL (ref 13.0–18.0)
Lymphocytes Relative: 18 %
Lymphs Abs: 1.8 10*3/uL (ref 1.0–3.6)
MCH: 29.7 pg (ref 26.0–34.0)
MCHC: 32.8 g/dL (ref 32.0–36.0)
MCV: 90.7 fL (ref 80.0–100.0)
Monocytes Absolute: 0.9 10*3/uL (ref 0.2–1.0)
Monocytes Relative: 9 %
Neutro Abs: 7.1 10*3/uL — ABNORMAL HIGH (ref 1.4–6.5)
Neutrophils Relative %: 69 %
Platelets: 320 10*3/uL (ref 150–440)
RBC: 4.56 MIL/uL (ref 4.40–5.90)
RDW: 13.4 % (ref 11.5–14.5)
WBC: 10.2 10*3/uL (ref 3.8–10.6)

## 2016-05-27 LAB — LACTATE DEHYDROGENASE: LDH: 178 U/L (ref 98–192)

## 2016-05-27 MED ORDER — SODIUM CHLORIDE 0.9% FLUSH
10.0000 mL | INTRAVENOUS | Status: DC | PRN
  Administered 2016-05-27: 10 mL via INTRAVENOUS
  Filled 2016-05-27: qty 10

## 2016-05-27 MED ORDER — HEPARIN SOD (PORK) LOCK FLUSH 100 UNIT/ML IV SOLN
500.0000 [IU] | Freq: Once | INTRAVENOUS | Status: AC
  Administered 2016-05-27: 500 [IU] via INTRAVENOUS

## 2016-05-27 NOTE — Progress Notes (Signed)
Patient here for follow up. States that neuropathy in fingers is increasing.

## 2016-05-27 NOTE — Telephone Encounter (Signed)
I attempted to make contact with the patient by calling both phone numbers listed for him. I left messages for him to contact our office in order to move forward with scheduling his procedure.

## 2016-05-27 NOTE — Progress Notes (Signed)
[facility] day:  05/27/2016  Chief Complaint: Edgar [name] is a 26 y.o. male with stage IIB testicular cancer who is seen for 2 month assessment.  HPI: The patient was last seen in the medical oncology clinic by me on 03/31/2016.  At that time, he was doing well.  Exam was unremarkable.  CT scans revealed no evidence of disease.  AFP, beta-HCG, and LDH were normal.  We discussed port removal.  I encouraged smoking cessation.  During the interim, he did not have his port removed.  Overall, he feels good.  He is back to work.  He notes numbness/tingling in his fingertips, new since his last appointment.  He describes working extensively with his hands at work.  He does a lot of "unlocking tabs" all day.  He is still smoking 1/2 pack/day.   Past Medical History:  Diagnosis Date  . Asthma    AS A CHILD-NO INHALERS  . GERD (gastroesophageal reflux disease)    TUMS PRN    Past Surgical History:  Procedure Laterality Date  . ANKLE FRACTURE SURGERY Right 2004  . ORCHIECTOMY Left 11/26/2015   Procedure: ORCHIECTOMY;  Surgeon: [name], MD;  Location: [facility];  Service: Urology;  Laterality: Left;  radical/Inguinal approach  . PERIPHERAL VASCULAR CATHETERIZATION N/A 12/16/2015   Procedure: [name] Cath Insertion;  Surgeon: [name], MD;  Location: [facility];  Service: Cardiovascular;  Laterality: N/A;  . WISDOM TOOTH EXTRACTION  2017    Family History  Problem Relation Age of Onset  . Skin cancer Mother   . Breast cancer Maternal Grandmother   . Colon cancer Maternal Grandfather   . Diabetes Maternal Grandfather   . Prostate cancer Neg Hx   . Kidney cancer Neg Hx   . Bladder Cancer Neg Hx     Social History:  reports that he has been smoking Cigarettes.  He has a 4.00 pack-year smoking history. He has quit using smokeless tobacco. His smokeless tobacco use included Snuff. He reports that he uses drugs, including  Marijuana. He reports that he does not drink alcohol.  He smokes 1/3 pack a day.  He smokes marijuana.  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  He has a 28 year-old-daughter named Edgar [name]. The patient's mother's cell phone number is [phone].  His grandmother recently died.  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  The patient lives in [place].  Allergies: No Known Allergies  Current Medications: Current Outpatient Prescriptions  Medication Sig Dispense Refill  . acetaminophen (TYLENOL) 325 MG tablet Take 650 mg by mouth every 6 (six) hours as needed.    . lidocaine-prilocaine (EMLA) cream Apply small amount of cream over the portacath site 1 hour before chemotherapy treatment, place saran wrap over the cream to protect your clothing 30 g 1  . dexamethasone (DECADRON) 4 MG tablet Take 2 tablets by mouth once a day on the day starting on day 6 of chemotherapy and then take 2 tablets two times a day for 2 days. Take with food. (Patient not taking: Reported on 05/27/2016) 30 tablet 1  . LORazepam (ATIVAN) 0.5 MG tablet Take 1 tablet (0.5 mg total) by mouth every 6 (six) hours as needed (Nausea or vomiting). (Patient not taking: Reported on 05/27/2016) 30 tablet 0   No current facility-administered medications for this visit.     Review of Systems:  GENERAL:  Feels good.  No fevers or sweats.  Weight down 17 pounds. PERFORMANCE STATUS (ECOG):  0 HEENT:  No visual changes, runny nose, sore throat, mouth sores or tenderness. Lungs: No shortness of breath or cough.  No hemoptysis. Cardiac:  No chest pain, palpitations, orthopnea, or PND. GI:  No nausea, vomiting, diarrhea, constipation, melena or hematochezia. GU:  No urgency, frequency, dysuria, or hematuria. Musculoskeletal:  No back pain.  No joint pain.  No muscle tenderness. Extremities:  No pain or swelling. Skin:  No rashes, ulcers, or skin changes. Neuro:  Neuropathy in fingertips.  No headache,  numbness or weakness, balance or coordination issues. Endocrine:  No diabetes, thyroid issues, hot flashes or night sweats. Psych:  No mood changes, depression or anxiety. Pain:  No pain. Review of systems:  All other systems reviewed and found to be negative.  Physical Exam: 266 Blood pressure 125/83, pulse 79, temperature 97.2 F (36.2 C), temperature source Tympanic, height 6' 3"  (1.905 m), weight 259 lb 5.9 oz (117.7 kg). GENERAL:  Well developed, well nourished, gentleman sitting comfortably in the exam room in no acute distress. MENTAL STATUS:  Alert and oriented to person, place and time. HEAD:  Wearing a cap.  Full brown beard.  Normocephalic, atraumatic, face symmetric, no Cushingoid features. EYES:  Pupils equal round and reactive to light and accomodation.  No conjunctivitis or scleral icterus. ENT:  Oropharynx clear without lesion.  Tongue normal. Mucous membranes moist.  RESPIRATORY:  Clear to auscultation without rales, wheezes or rhonchi. CARDIOVASCULAR:  Regular rate and rhythm without murmur, rub or gallop. ABDOMEN:  Soft, non-tender, with active bowel sounds, and no appreciable hepatosplenomegaly.  No masses. GU:  Right testicle without masses or tenderness.  s/p left orchiectomy with well healed inguinal incision and no hematoma in left hemiscrotum. SKIN:  No rashes, ulcers or lesions. EXTREMITIES: No edema, no skin discoloration or tenderness.  No palpable cords. LYMPH NODES: No palpable cervical, supraclavicular, axillary or inguinal adenopathy  NEUROLOGICAL: Unremarkable. PSYCH:  Appropriate.    Appointment on 05/27/2016  Component Date Value Ref Range Status  . WBC 05/27/2016 10.2  3.8 - 10.6 K/uL Final  . RBC 05/27/2016 4.56  4.40 - 5.90 MIL/uL Final  . Hemoglobin 05/27/2016 13.6  13.0 - 18.0 g/dL Final  . HCT 05/27/2016 41.4  40.0 - 52.0 % Final  . MCV 05/27/2016 90.7  80.0 - 100.0 fL Final  . MCH 05/27/2016 29.7  26.0 - 34.0 pg Final  . MCHC 05/27/2016 32.8   32.0 - 36.0 g/dL Final  . RDW 05/27/2016 13.4  11.5 - 14.5 % Final  . Platelets 05/27/2016 320  150 - 440 K/uL Final  . Neutrophils Relative % 05/27/2016 69  % Final  . Neutro Abs 05/27/2016 7.1* 1.4 - 6.5 K/uL Final  . Lymphocytes Relative 05/27/2016 18  % Final  . Lymphs Abs 05/27/2016 1.8  1.0 - 3.6 K/uL Final  . Monocytes Relative 05/27/2016 9  % Final  . Monocytes Absolute 05/27/2016 0.9  0.2 - 1.0 K/uL Final  . Eosinophils Relative 05/27/2016 4  % Final  . Eosinophils Absolute 05/27/2016 0.4  0 - 0.7 K/uL Final  . Basophils Relative 05/27/2016 0  % Final  . Basophils Absolute 05/27/2016 0.0  0 - 0.1 K/uL Final  . Sodium 05/27/2016 140  135 - 145 mmol/L Final  . Potassium 05/27/2016 4.4  3.5 - 5.1 mmol/L Final  . Chloride 05/27/2016 103  101 - 111 mmol/L Final  . CO2 05/27/2016 28  22 - 32 mmol/L Final  . Glucose, Bld 05/27/2016 95  65 - 99 mg/dL Final  . BUN 05/27/2016 19  6 - 20 mg/dL Final  . Creatinine, Ser 05/27/2016 1.08  0.61 - 1.24 mg/dL Final  . Calcium 05/27/2016 9.4  8.9 - 10.3 mg/dL Final  . Total Protein 05/27/2016 7.6  6.5 - 8.1 g/dL Final  . Albumin 05/27/2016 4.6  3.5 - 5.0 g/dL Final  . AST 05/27/2016 24  15 - 41 U/L Final  . ALT 05/27/2016 22  17 - 63 U/L Final  . Alkaline Phosphatase 05/27/2016 56  38 - 126 U/L Final  . Total Bilirubin 05/27/2016 0.2* 0.3 - 1.2 mg/dL Final  . GFR calc non Af Amer 05/27/2016 >60  >60 mL/min Final  . GFR calc Af Amer 05/27/2016 >60  >60 mL/min Final   Comment: (NOTE) The eGFR has been calculated using the CKD EPI equation. This calculation has not been validated in all clinical situations. eGFR's persistently <60 mL/min signify possible Chronic Kidney Disease.   . Anion gap 05/27/2016 9  5 - 15 Final  . LDH 05/27/2016 178  98 - 192 U/L Final    Assessment:  Edgar [name] is a 26 y.o. male with stage IIB left testicular cancer s/p left radical orchiectomy on 11/26/2015.  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  Clinical/pathologic stage is T1 N1-2 S0 M0.  He received 3 cycles of  BEP (12/30/2015 - 02/24/2016).  He received OnPro Neulasta with cycle #2 and #3.  He declined evaluation for retroperitoneal lymph node dissection (RPLND).  He declined sperm banking.  Pre surgery labs on 11/20/2015 revealed an AFP of 411.9, beta-HCG 2,669, and LDH 522 (121-224).    Tumor markers have been followed:   AFP was 411.9 (0-8.3) on 11/20/2015, 11.2 on 12/19/2015, 6.3 on 12/23/2015, 1.3 on 01/27/2016, 1.8 on 02/17/2016, and 1.9 on 03/30/2016.   Beta-HCG was 2,669 (0-3) on 11/20/2015, 2 on 12/19/2015, 1 on 12/23/2015, < 1 on 01/27/2016, < 1 on 02/17/2016, and < 1 on 03/30/2016.   LDH was 522 (98-192) on 11/20/2015, 179 on 12/19/2015, 160 on 12/23/2015, 202 on 01/27/2016, 156 on 02/10/2016, 161 on 02/17/2016, and 172 on 03/30/2016.  Chest, abdomen, and pelvic CT scan on 11/28/2015 revealed no mediastinal adenopathy or suspicious pulmonary nodules.   There were 2 prominent left periaortic retroperitoneal lymph nodes (1.7 cm and 0.9 cm) concerning for testicular nodal metastasis.  There were no abnormal lymph nodes above the renal veins.  There were postsurgical change in the left hemiscrotum and left inguinal canal.  There was a 3.3 cm soft tissue abnormality anterior to the left iliopsoas muscle which could represent a metastatic lymph node (N2 lesion).  Head MRI on 12/17/2015 revealed no evidence of metastatic disease.    Chest, abdomen, and pelvic CT scan on 03/30/2016 revealed interval resolution of left abdominal/pelvic lymphadenopathy.  There was no residual metastatic disease.  Pulmonary function testing revealed a FVC 4.71 liters (76% predicted), FEV1 4.18 liters (88% predicted), TLC 7.23 liters (87% predicted), VC 4.88 (79% predicted) and DLCO of 39.3 mL/mmHg/min (100% predicted).   He smokes 1/2 pack per  day.  Symptomatically, he has numbness/tingling in his fingertips associated with his work.  Exam is unremakable.  He is still smoking.  Plan: 1.  Labs today:  CBC with diff, CMP, LDH, AFP, beta-HCG. 2.  Call  patient with AFP and beta-HCG results. 3.  Continue surveillance in year 1 (H&P every 2 months, abdomen/pelvic CT scan every 6 months , CXR every 6 months), year 2 (H&P every 3 months, abdomen/pelvic CT scan every 6-12 months , CXR every 6 months), year 3 (H&P every 6 months, abdomen/pelvic CT scan every year , CXR every year), year 4 (H&P every 6 months, CXR every year), and year 5 (H&P every 6 months). 4.  Consult neurology: numbness/tingling in fingertips. 5.  Port flush today. 6.  Port removal with Dr. Lucky Cowboy. 7.  Encourage smoking cessation. 8.  RTC in 2 months for MD assessment and labs (CBC with diff, CMP, LDH, AFP, beta-HCG).   [name], MD  05/27/2016, 9:15 AM

## 2016-05-28 ENCOUNTER — Telehealth: Payer: Self-pay | Admitting: *Deleted

## 2016-05-28 LAB — AFP TUMOR MARKER: AFP-Tumor Marker: 1.4 ng/mL (ref 0.0–8.3)

## 2016-05-28 LAB — BETA HCG QUANT (REF LAB): Beta hCG, Tumor Marker: <1 m[IU]/mL (ref 0–3)

## 2016-05-28 NOTE — Telephone Encounter (Signed)
-----   Message from [name], MD sent at 05/28/2016  9:09 AM EDT ----- Regarding: Please call patient with results   ----- Message ----- From: [place]: 05/27/2016   8:55 AM To: [name], MD

## 2016-05-28 NOTE — Telephone Encounter (Signed)
Called patient and lvm to let him know tumor markers are good.  Also informed him order was sent to Dr. Lucky Cowboy for port removal and referral was sent to Dr. Manuella Ghazi for neurology appointment.

## 2016-06-15 ENCOUNTER — Telehealth: Payer: Self-pay

## 2016-06-15 NOTE — Telephone Encounter (Signed)
Survivorship Care Plan completed.  Treatment summary reviewed and mailed to patient.  ASCO answers booklet reviewed and mailed to patient.  CARE program and Cancer Transitions discussed with patient along with other resources cancer center offers to patients and caregivers.

## 2016-07-22 ENCOUNTER — Other Ambulatory Visit: Payer: BLUE CROSS/BLUE SHIELD

## 2016-07-22 ENCOUNTER — Ambulatory Visit: Payer: BLUE CROSS/BLUE SHIELD | Admitting: Hematology and Oncology

## 2016-07-23 ENCOUNTER — Telehealth (INDEPENDENT_AMBULATORY_CARE_PROVIDER_SITE_OTHER): Payer: Self-pay

## 2016-07-23 NOTE — Telephone Encounter (Signed)
Attempted to contact the patient and had to leave a message for patient to return my call.

## 2016-07-29 ENCOUNTER — Inpatient Hospital Stay: Payer: BLUE CROSS/BLUE SHIELD | Attending: Hematology and Oncology | Admitting: Hematology and Oncology

## 2016-07-29 ENCOUNTER — Inpatient Hospital Stay: Payer: BLUE CROSS/BLUE SHIELD

## 2016-07-29 ENCOUNTER — Encounter: Payer: Self-pay | Admitting: Hematology and Oncology

## 2016-07-29 ENCOUNTER — Other Ambulatory Visit: Payer: Self-pay | Admitting: *Deleted

## 2016-07-29 VITALS — BP 127/85 | HR 77 | Temp 97.2°F | Resp 18 | Wt 264.6 lb

## 2016-07-29 DIAGNOSIS — R202 Paresthesia of skin: Secondary | ICD-10-CM

## 2016-07-29 DIAGNOSIS — Z79899 Other long term (current) drug therapy: Secondary | ICD-10-CM | POA: Diagnosis not present

## 2016-07-29 DIAGNOSIS — Z800 Family history of malignant neoplasm of digestive organs: Secondary | ICD-10-CM | POA: Diagnosis not present

## 2016-07-29 DIAGNOSIS — K219 Gastro-esophageal reflux disease without esophagitis: Secondary | ICD-10-CM | POA: Diagnosis not present

## 2016-07-29 DIAGNOSIS — F17210 Nicotine dependence, cigarettes, uncomplicated: Secondary | ICD-10-CM

## 2016-07-29 DIAGNOSIS — G629 Polyneuropathy, unspecified: Secondary | ICD-10-CM | POA: Insufficient documentation

## 2016-07-29 DIAGNOSIS — R200 Anesthesia of skin: Secondary | ICD-10-CM | POA: Diagnosis not present

## 2016-07-29 DIAGNOSIS — C6290 Malignant neoplasm of unspecified testis, unspecified whether descended or undescended: Secondary | ICD-10-CM

## 2016-07-29 DIAGNOSIS — Z803 Family history of malignant neoplasm of breast: Secondary | ICD-10-CM

## 2016-07-29 DIAGNOSIS — Z9079 Acquired absence of other genital organ(s): Secondary | ICD-10-CM | POA: Diagnosis not present

## 2016-07-29 DIAGNOSIS — C6212 Malignant neoplasm of descended left testis: Secondary | ICD-10-CM | POA: Insufficient documentation

## 2016-07-29 DIAGNOSIS — Z85828 Personal history of other malignant neoplasm of skin: Secondary | ICD-10-CM

## 2016-07-29 DIAGNOSIS — Z8547 Personal history of malignant neoplasm of testis: Secondary | ICD-10-CM

## 2016-07-29 DIAGNOSIS — Z95828 Presence of other vascular implants and grafts: Secondary | ICD-10-CM

## 2016-07-29 LAB — COMPREHENSIVE METABOLIC PANEL
ALT: 20 U/L (ref 17–63)
AST: 21 U/L (ref 15–41)
Albumin: 4 g/dL (ref 3.5–5.0)
Alkaline Phosphatase: 54 U/L (ref 38–126)
Anion gap: 6 (ref 5–15)
BUN: 14 mg/dL (ref 6–20)
CO2: 32 mmol/L (ref 22–32)
Calcium: 9 mg/dL (ref 8.9–10.3)
Chloride: 102 mmol/L (ref 101–111)
Creatinine, Ser: 0.89 mg/dL (ref 0.61–1.24)
GFR calc Af Amer: >60 mL/min (ref >60)
GFR calc non Af Amer: >60 mL/min (ref >60)
Glucose, Bld: 84 mg/dL (ref 65–99)
Potassium: 3.8 mmol/L (ref 3.5–5.1)
Sodium: 140 mmol/L (ref 135–145)
Total Bilirubin: 0.6 mg/dL (ref 0.3–1.2)
Total Protein: 6.8 g/dL (ref 6.5–8.1)

## 2016-07-29 LAB — CBC WITH DIFFERENTIAL/PLATELET
Basophils Absolute: 0 10*3/uL (ref 0–0.1)
Basophils Relative: 1 %
Eosinophils Absolute: 0.5 10*3/uL (ref 0–0.7)
Eosinophils Relative: 7 %
HCT: 40.8 % (ref 40.0–52.0)
Hemoglobin: 13.3 g/dL (ref 13.0–18.0)
Lymphocytes Relative: 28 %
Lymphs Abs: 2.1 10*3/uL (ref 1.0–3.6)
MCH: 29.2 pg (ref 26.0–34.0)
MCHC: 32.7 g/dL (ref 32.0–36.0)
MCV: 89.3 fL (ref 80.0–100.0)
Monocytes Absolute: 0.6 10*3/uL (ref 0.2–1.0)
Monocytes Relative: 8 %
Neutro Abs: 4.1 10*3/uL (ref 1.4–6.5)
Neutrophils Relative %: 56 %
Platelets: 271 10*3/uL (ref 150–440)
RBC: 4.57 MIL/uL (ref 4.40–5.90)
RDW: 13.6 % (ref 11.5–14.5)
WBC: 7.4 10*3/uL (ref 3.8–10.6)

## 2016-07-29 LAB — LACTATE DEHYDROGENASE: LDH: 150 U/L (ref 98–192)

## 2016-07-29 MED ORDER — SODIUM CHLORIDE 0.9% FLUSH
10.0000 mL | INTRAVENOUS | Status: DC | PRN
  Administered 2016-07-29: 10 mL via INTRAVENOUS
  Filled 2016-07-29: qty 10

## 2016-07-29 MED ORDER — HEPARIN SOD (PORK) LOCK FLUSH 100 UNIT/ML IV SOLN
500.0000 [IU] | Freq: Once | INTRAVENOUS | Status: AC
  Administered 2016-07-29: 500 [IU] via INTRAVENOUS

## 2016-07-29 NOTE — Progress Notes (Signed)
Patient decided to not have his port removed.  Also states numbness/tingling in fingers is improved.  Offers no complaints today.

## 2016-07-29 NOTE — Progress Notes (Signed)
[facility] day:  07/29/2016  Chief Complaint: Edgar [name] is a 26 y.o. male with stage IIB testicular cancer who is seen for 2 month assessment.  HPI: The patient was last seen in the medical oncology clinic by me on 05/27/2016.  At that time, he had numbness/tingling in his fingertips associated with his work.  Exam was unremakable.  He was still smoking.  AFP, beta-HCG, and LDH were normal.  During the interim, he is felt good.  He denies any problems. He feels "back to normal".  He is working 12 hour shifts. His neuropathy has improved.  He is smoking 1/2-1/3 pack a day.   Past Medical History:  Diagnosis Date  . Asthma    AS A CHILD-NO INHALERS  . GERD (gastroesophageal reflux disease)    TUMS PRN    Past Surgical History:  Procedure Laterality Date  . ANKLE FRACTURE SURGERY Right 2004  . ORCHIECTOMY Left 11/26/2015   Procedure: ORCHIECTOMY;  Surgeon: [name], MD;  Location: [facility];  Service: Urology;  Laterality: Left;  radical/Inguinal approach  . PERIPHERAL VASCULAR CATHETERIZATION N/A 12/16/2015   Procedure: [name] Cath Insertion;  Surgeon: [name], MD;  Location: [facility];  Service: Cardiovascular;  Laterality: N/A;  . WISDOM TOOTH EXTRACTION  2017    Family History  Problem Relation Age of Onset  . Skin cancer Mother   . Breast cancer Maternal Grandmother   . Colon cancer Maternal Grandfather   . Diabetes Maternal Grandfather   . Prostate cancer Neg Hx   . Kidney cancer Neg Hx   . Bladder Cancer Neg Hx     Social History:  reports that he has been smoking Cigarettes.  He has a 4.00 pack-year smoking history. He has quit using smokeless tobacco. His smokeless tobacco use included Snuff. He reports that he uses drugs, including Marijuana. He reports that he does not drink alcohol.  He smokes 1/3 pack a day.  He smokes marijuana.  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  He has a 61 year-old-daughter named [name]. The patient's mother's cell phone number is [phone].  His grandmother recently died.  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  The patient lives in [place].  Allergies: No Known Allergies  Current Medications: Current Outpatient Prescriptions  Medication Sig Dispense Refill  . acetaminophen (TYLENOL) 325 MG tablet Take 650 mg by mouth every 6 (six) hours as needed.    Marland Kitchen dexamethasone (DECADRON) 4 MG tablet Take 2 tablets by mouth once a day on the day starting on day 6 of chemotherapy and then take 2 tablets two times a day for 2 days. Take with food. 30 tablet 1  . lidocaine-prilocaine (EMLA) cream Apply small amount of cream over the portacath site 1 hour before chemotherapy treatment, place saran wrap over the cream to protect your clothing 30 g 1  . LORazepam (ATIVAN) 0.5 MG tablet Take 1 tablet (0.5 mg total) by mouth every 6 (six) hours as needed (Nausea or vomiting). 30 tablet 0   No current facility-administered medications for this visit.     Review of Systems:  GENERAL:  Feels good.  No fevers or sweats.  Weight up 5 pounds. PERFORMANCE STATUS (ECOG):  0 HEENT:  Nasal congestion.  No visual changes, runny nose, sore throat, mouth sores or tenderness. Lungs: No shortness of breath or cough.  No  hemoptysis. Cardiac:  No chest pain, palpitations, orthopnea, or PND. GI:  No nausea, vomiting, diarrhea, constipation, melena or hematochezia. GU:  No urgency, frequency, dysuria, or hematuria. Musculoskeletal:  No back pain.  No joint pain.  No muscle tenderness. Extremities:  No pain or swelling. Skin:  No rashes, ulcers, or skin changes. Neuro:  Neuropathy in fingertips, impoved.  No headache, numbness or weakness, balance or coordination issues. Endocrine:  No diabetes, thyroid issues, hot flashes or night sweats. Psych:  No mood changes, depression or anxiety. Pain:  No pain. Review of systems:  All other systems  reviewed and found to be negative.  Physical Exam:  Blood pressure 127/85, pulse 77, temperature 97.2 F (36.2 C), temperature source Tympanic, resp. rate 18, weight 264 lb 8.8 oz (120 kg). GENERAL:  Well developed, well nourished, gentleman sitting comfortably in the exam room in no acute distress. MENTAL STATUS:  Alert and oriented to person, place and time. HEAD:  Normocephalic, atraumatic, face symmetric, no Cushingoid features. EYES:  Pupils equal round and reactive to light and accomodation.  No conjunctivitis or scleral icterus. ENT:  Oropharynx clear without lesion.  Tongue normal. Mucous membranes moist.  RESPIRATORY:  Clear to auscultation without rales, wheezes or rhonchi. CARDIOVASCULAR:  Regular rate and rhythm without murmur, rub or gallop. ABDOMEN:  Soft, non-tender, with active bowel sounds, and no appreciable hepatosplenomegaly.  No masses. GU:  Right testicle without masses or tenderness.  s/p left orchiectomy with well healed inguinal incision and no mass. SKIN:  No rashes, ulcers or lesions. EXTREMITIES: No edema, no skin discoloration or tenderness.  No palpable cords. LYMPH NODES: No palpable cervical, supraclavicular, axillary or inguinal adenopathy  NEUROLOGICAL: Unremarkable. PSYCH:  Appropriate.   Imaging studies: 11/28/2015:  Chest, abdomen, and pelvic CT revealed no mediastinal adenopathy or suspicious pulmonary nodules.   There were 2 prominent left periaortic retroperitoneal lymph nodes (1.7 cm and 0.9 cm) concerning for testicular nodal metastasis.  There were no abnormal lymph nodes above the renal veins.  There were postsurgical change in the left hemiscrotum and left inguinal canal.  There was a 3.3 cm soft tissue abnormality anterior to the left iliopsoas muscle which could represent a metastatic lymph node (N2 lesion).  12/17/2015:  Head MRI revealed no evidence of metastatic disease.   03/30/2016:  Chest, abdomen, and pelvic CT revealed interval resolution  of left abdominal/pelvic lymphadenopathy.  There was no residual metastatic disease.   Appointment on 07/29/2016  Component Date Value Ref Range Status  . WBC 07/29/2016 7.4  3.8 - 10.6 K/uL Final  . RBC 07/29/2016 4.57  4.40 - 5.90 MIL/uL Final  . Hemoglobin 07/29/2016 13.3  13.0 - 18.0 g/dL Final  . HCT 07/29/2016 40.8  40.0 - 52.0 % Final  . MCV 07/29/2016 89.3  80.0 - 100.0 fL Final  . MCH 07/29/2016 29.2  26.0 - 34.0 pg Final  . MCHC 07/29/2016 32.7  32.0 - 36.0 g/dL Final  . RDW 07/29/2016 13.6  11.5 - 14.5 % Final  . Platelets 07/29/2016 271  150 - 440 K/uL Final  . Neutrophils Relative % 07/29/2016 56  % Final  . Neutro Abs 07/29/2016 4.1  1.4 - 6.5 K/uL Final  . Lymphocytes Relative 07/29/2016 28  % Final  . Lymphs Abs 07/29/2016 2.1  1.0 - 3.6 K/uL Final  . Monocytes Relative 07/29/2016 8  % Final  . Monocytes Absolute 07/29/2016 0.6  0.2 - 1.0 K/uL Final  . Eosinophils Relative 07/29/2016 7  % Final  .  Eosinophils Absolute 07/29/2016 0.5  0 - 0.7 K/uL Final  . Basophils Relative 07/29/2016 1  % Final  . Basophils Absolute 07/29/2016 0.0  0 - 0.1 K/uL Final  . LDH 07/29/2016 150  98 - 192 U/L Final    Assessment:  JARIOUS LYON is a 26 y.o. male with stage IIB left testicular cancer s/p left radical orchiectomy on 11/26/2015.  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  Clinical/pathologic stage is T1 N1-2 S0 M0.  He received 3 cycles of  BEP (12/30/2015 - 02/24/2016).  He received OnPro Neulasta with cycle #2 and #3.  He declined evaluation for retroperitoneal lymph node dissection (RPLND).  He declined sperm banking.  Pre surgery labs on 11/20/2015 revealed an AFP of 411.9, beta-HCG 2,669, and LDH 522 (121-224).    Tumor markers have been followed:   AFP was 411.9 (0-8.3) on 11/20/2015, 11.2 on 12/19/2015, 6.3 on 12/23/2015, 1.3 on 01/27/2016, 1.8 on  02/17/2016, 1.9 on 03/30/2016, 1.4 on 05/27/2016, and 1.0 on 07/29/2016.   Beta-HCG was 2,669 (0-3) on 11/20/2015, 2 on 12/19/2015, 1 on 12/23/2015, < 1 on 01/27/2016, < 1 on 02/17/2016, < 1 on 03/30/2016, < 1 on 05/27/2016, and < 1 on 0516/2018.   LDH was 522 (98-192) on 11/20/2015, 179 on 12/19/2015, 160 on 12/23/2015, 202 on 01/27/2016, 156 on 02/10/2016, 161 on 02/17/2016, 172 on 03/30/2016, 178 on 05/27/2016, and 150 on 07/29/2016.  Chest, abdomen, and pelvic CT scan on 03/30/2016 revealed interval resolution of left abdominal/pelvic lymphadenopathy.  There was no residual metastatic disease.  Pulmonary function testing revealed a FVC 4.71 liters (76% predicted), FEV1 4.18 liters (88% predicted), TLC 7.23 liters (87% predicted), VC 4.88 (79% predicted) and DLCO of 39.3 mL/mmHg/min (100% predicted).   He smokes 1/2 pack per day.  Symptomatically, he feels back to normal.  He has a little numbness/tingling in his fingertips.  Exam is unremakable.  He is still smoking.  Plan: 1.  Labs today:  CBC with diff, CMP, LDH, AFP, beta-HCG. 2.  Call patient with AFP and beta-HCG results. 3.  Continue surveillance in year 1 (H&P every 2 months, abdomen/pelvic CT scan every 6 months , CXR every 6 months), year 2 (H&P every 3 months, abdomen/pelvic CT scan every 6-12 months , CXR every 6 months), year 3 (H&P every 6 months, abdomen/pelvic CT scan every year , CXR every year), year 4 (H&P every 6 months, CXR every year), and year 5 (H&P every 6 months). 4.  Port flush today.  Patient wishes to keep port at this time. 5.  Encourage smoking cessation. 6.  Chest, abdomen, pelvic CT scan 09/27/2016 (or after return from [facility]) 7.  RTC after CT scans for MD assessment, labs (CBC with diff, CMP, AFP, LDH, beta-HCG), and review of scans.   [name], MD  07/29/2016, 11:50 AM

## 2016-07-30 LAB — BETA HCG QUANT (REF LAB): Beta hCG, Tumor Marker: <1 m[IU]/mL (ref 0–3)

## 2016-07-30 LAB — AFP TUMOR MARKER: AFP-Tumor Marker: 1 ng/mL (ref 0.0–8.3)

## 2016-09-30 ENCOUNTER — Inpatient Hospital Stay: Payer: BLUE CROSS/BLUE SHIELD

## 2016-09-30 ENCOUNTER — Inpatient Hospital Stay: Payer: BLUE CROSS/BLUE SHIELD | Attending: Hematology and Oncology

## 2016-09-30 ENCOUNTER — Ambulatory Visit
Admission: RE | Admit: 2016-09-30 | Discharge: 2016-09-30 | Disposition: A | Discharge status: Home / Self Care | Payer: BLUE CROSS/BLUE SHIELD | Source: Ambulatory Visit | Attending: Hematology and Oncology | Admitting: Hematology and Oncology

## 2016-09-30 DIAGNOSIS — Z452 Encounter for adjustment and management of vascular access device: Secondary | ICD-10-CM | POA: Diagnosis not present

## 2016-09-30 DIAGNOSIS — C6212 Malignant neoplasm of descended left testis: Secondary | ICD-10-CM | POA: Diagnosis present

## 2016-09-30 DIAGNOSIS — Z95828 Presence of other vascular implants and grafts: Secondary | ICD-10-CM

## 2016-09-30 HISTORY — DX: Malignant (primary) neoplasm, unspecified: C80.1

## 2016-09-30 LAB — CBC WITH DIFFERENTIAL/PLATELET
Band Neutrophils: 2 %
Basophils Absolute: 0.1 10*3/uL (ref 0–0.1)
Basophils Relative: 2 %
Blasts: 0 %
Eosinophils Absolute: 0.3 10*3/uL (ref 0–0.7)
Eosinophils Relative: 5 %
HCT: 45 % (ref 40.0–52.0)
Hemoglobin: 14.7 g/dL (ref 13.0–18.0)
Lymphocytes Relative: 33 %
Lymphs Abs: 2.2 10*3/uL (ref 1.0–3.6)
MCH: 30 pg (ref 26.0–34.0)
MCHC: 32.8 g/dL (ref 32.0–36.0)
MCV: 91.6 fL (ref 80.0–100.0)
Metamyelocytes Relative: 2 %
Monocytes Absolute: 0.5 10*3/uL (ref 0.2–1.0)
Monocytes Relative: 8 %
Myelocytes: 0 %
Neutro Abs: 3.7 10*3/uL (ref 1.4–6.5)
Neutrophils Relative %: 48 %
Other: 0 %
Platelets: 324 10*3/uL (ref 150–440)
Promyelocytes Absolute: 0 %
RBC: 4.91 MIL/uL (ref 4.40–5.90)
RDW: 13.6 % (ref 11.5–14.5)
WBC: 6.8 10*3/uL (ref 3.8–10.6)
nRBC: 0 /100 WBC

## 2016-09-30 LAB — COMPREHENSIVE METABOLIC PANEL
ALT: 18 U/L (ref 17–63)
AST: 20 U/L (ref 15–41)
Albumin: 4.6 g/dL (ref 3.5–5.0)
Alkaline Phosphatase: 59 U/L (ref 38–126)
Anion gap: 7 (ref 5–15)
BUN: 10 mg/dL (ref 6–20)
CO2: 30 mmol/L (ref 22–32)
Calcium: 9.5 mg/dL (ref 8.9–10.3)
Chloride: 105 mmol/L (ref 101–111)
Creatinine, Ser: 0.89 mg/dL (ref 0.61–1.24)
GFR calc Af Amer: >60 mL/min (ref >60)
GFR calc non Af Amer: >60 mL/min (ref >60)
Glucose, Bld: 118 mg/dL — ABNORMAL HIGH (ref 65–99)
Potassium: 4.6 mmol/L (ref 3.5–5.1)
Sodium: 142 mmol/L (ref 135–145)
Total Bilirubin: 0.3 mg/dL (ref 0.3–1.2)
Total Protein: 7.3 g/dL (ref 6.5–8.1)

## 2016-09-30 MED ORDER — IOPAMIDOL (ISOVUE-300) INJECTION 61%
125.0000 mL | Freq: Once | INTRAVENOUS | Status: AC | PRN
  Administered 2016-09-30: 125 mL via INTRAVENOUS

## 2016-09-30 MED ORDER — HEPARIN SOD (PORK) LOCK FLUSH 100 UNIT/ML IV SOLN
500.0000 [IU] | Freq: Once | INTRAVENOUS | Status: AC
  Administered 2016-09-30: 500 [IU] via INTRAVENOUS

## 2016-09-30 MED ORDER — SODIUM CHLORIDE 0.9% FLUSH
10.0000 mL | INTRAVENOUS | Status: DC | PRN
  Administered 2016-09-30: 10 mL via INTRAVENOUS
  Filled 2016-09-30: qty 10

## 2016-10-07 ENCOUNTER — Other Ambulatory Visit: Payer: BLUE CROSS/BLUE SHIELD

## 2016-10-07 ENCOUNTER — Inpatient Hospital Stay: Payer: BLUE CROSS/BLUE SHIELD | Admitting: Hematology and Oncology

## 2016-10-07 NOTE — Progress Notes (Deleted)
[facility] day:  10/07/2016  Chief Complaint: Edgar [name] is a 26 y.o. male with stage IIB testicular cancer who is seen for 2 month assessment.  HPI: The patient was last seen in the medical oncology clinic by me on 07/29/2016.  At that time, he felt "back to normal".  He was working 12 hour shifts. His neuropathy had improved.  He was smoking 1/2-1/3 pack a day.  AFP, beta-HCG, and LDH were normal.  Chest, abdomen, and pelvic CT scan on 09/30/2016 revealed no evidence of recurrent or metastatic disease.  Labs on 09/30/2016 revealed a normal CBC with diff and CMP.     Past Medical History:  Diagnosis Date  . Asthma    AS A CHILD-NO INHALERS  . Cancer ([place])    testicular cancer 11/2016 per pt   . GERD (gastroesophageal reflux disease)    TUMS PRN    Past Surgical History:  Procedure Laterality Date  . ANKLE FRACTURE SURGERY Right 2004  . ORCHIECTOMY Left 11/26/2015   Procedure: ORCHIECTOMY;  Surgeon: [name], MD;  Location: [facility];  Service: Urology;  Laterality: Left;  radical/Inguinal approach  . PERIPHERAL VASCULAR CATHETERIZATION N/A 12/16/2015   Procedure: [name] Cath Insertion;  Surgeon: [name], MD;  Location: [facility];  Service: Cardiovascular;  Laterality: N/A;  . WISDOM TOOTH EXTRACTION  2017    Family History  Problem Relation Age of Onset  . Skin cancer Mother   . Breast cancer Maternal Grandmother   . Colon cancer Maternal Grandfather   . Diabetes Maternal Grandfather   . Prostate cancer Neg Hx   . Kidney cancer Neg Hx   . Bladder Cancer Neg Hx     Social History:  reports that he has been smoking Cigarettes.  He has a 4.00 pack-year smoking history. He has quit using smokeless tobacco. His smokeless tobacco use included Snuff. He reports that he uses drugs, including Marijuana. He reports that he does not drink alcohol.  He smokes 1/3 pack a day.  He smokes marijuana.  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  He has a 48 year-old-daughter named [name]. The patient's mother's cell phone number is [phone].  His grandmother recently died.  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  The patient lives in [place].  Allergies: No Known Allergies  Current Medications: Current Outpatient Prescriptions  Medication Sig Dispense Refill  . acetaminophen (TYLENOL) 325 MG tablet Take 650 mg by mouth every 6 (six) hours as needed.    Marland Kitchen dexamethasone (DECADRON) 4 MG tablet Take 2 tablets by mouth once a day on the day starting on day 6 of chemotherapy and then take 2 tablets two times a day for 2 days. Take with food. 30 tablet 1  . lidocaine-prilocaine (EMLA) cream Apply small amount of cream over the portacath site 1 hour before chemotherapy treatment, place saran wrap over the cream to protect your clothing 30 g 1  . LORazepam (ATIVAN) 0.5 MG tablet Take 1 tablet (0.5 mg total) by mouth every 6 (six) hours as needed (Nausea or vomiting). 30 tablet 0   No current facility-administered medications for this visit.     Review of Systems:  GENERAL:  Feels good.  No fevers or sweats.  Weight up 5 pounds. PERFORMANCE STATUS (ECOG):  0 HEENT:  Nasal congestion.  No visual changes, runny nose, sore throat, mouth  sores or tenderness. Lungs: No shortness of breath or cough.  No hemoptysis. Cardiac:  No chest pain, palpitations, orthopnea, or PND. GI:  No nausea, vomiting, diarrhea, constipation, melena or hematochezia. GU:  No urgency, frequency, dysuria, or hematuria. Musculoskeletal:  No back pain.  No joint pain.  No muscle tenderness. Extremities:  No pain or swelling. Skin:  No rashes, ulcers, or skin changes. Neuro:  Neuropathy in fingertips, impoved.  No headache, numbness or weakness, balance or coordination issues. Endocrine:  No diabetes, thyroid issues, hot flashes or night sweats. Psych:  No mood changes, depression or anxiety. Pain:  No  pain. Review of systems:  All other systems reviewed and found to be negative.  Physical Exam:  There were no vitals taken for this visit. GENERAL:  Well developed, well nourished, gentleman sitting comfortably in the exam room in no acute distress. MENTAL STATUS:  Alert and oriented to person, place and time. HEAD:  Normocephalic, atraumatic, face symmetric, no Cushingoid features. EYES:  Pupils equal round and reactive to light and accomodation.  No conjunctivitis or scleral icterus. ENT:  Oropharynx clear without lesion.  Tongue normal. Mucous membranes moist.  RESPIRATORY:  Clear to auscultation without rales, wheezes or rhonchi. CARDIOVASCULAR:  Regular rate and rhythm without murmur, rub or gallop. ABDOMEN:  Soft, non-tender, with active bowel sounds, and no appreciable hepatosplenomegaly.  No masses. GU:  Right testicle without masses or tenderness.  s/p left orchiectomy with well healed inguinal incision and no mass. SKIN:  No rashes, ulcers or lesions. EXTREMITIES: No edema, no skin discoloration or tenderness.  No palpable cords. LYMPH NODES: No palpable cervical, supraclavicular, axillary or inguinal adenopathy  NEUROLOGICAL: Unremarkable. PSYCH:  Appropriate.   Imaging studies: 11/28/2015:  Chest, abdomen, and pelvic CT revealed no mediastinal adenopathy or suspicious pulmonary nodules.   There were 2 prominent left periaortic retroperitoneal lymph nodes (1.7 cm and 0.9 cm) concerning for testicular nodal metastasis.  There were no abnormal lymph nodes above the renal veins.  There were postsurgical change in the left hemiscrotum and left inguinal canal.  There was a 3.3 cm soft tissue abnormality anterior to the left iliopsoas muscle which could represent a metastatic lymph node (N2 lesion).  12/17/2015:  Head MRI revealed no evidence of metastatic disease.   03/30/2016:  Chest, abdomen, and pelvic CT revealed interval resolution of left abdominal/pelvic lymphadenopathy.  There  was no residual metastatic disease. 09/30/2016:  Chest, abdomen, and pelvic CT revealed no evidence of recurrent or metastatic disease.    No visits with results within 3 Day(s) from this visit.  Latest known visit with results is:  Appointment on 09/30/2016  Component Date Value Ref Range Status  . WBC 09/30/2016 6.8  3.8 - 10.6 K/uL Final  . RBC 09/30/2016 4.91  4.40 - 5.90 MIL/uL Final  . Hemoglobin 09/30/2016 14.7  13.0 - 18.0 g/dL Final  . HCT 09/30/2016 45.0  40.0 - 52.0 % Final  . MCV 09/30/2016 91.6  80.0 - 100.0 fL Final  . MCH 09/30/2016 30.0  26.0 - 34.0 pg Final  . MCHC 09/30/2016 32.8  32.0 - 36.0 g/dL Final  . RDW 09/30/2016 13.6  11.5 - 14.5 % Final  . Platelets 09/30/2016 324  150 - 440 K/uL Final  . Neutrophils Relative % 09/30/2016 48  % Final  . Lymphocytes Relative 09/30/2016 33  % Final  . Monocytes Relative 09/30/2016 8  % Final  . Eosinophils Relative 09/30/2016 5  % Final  . Basophils Relative 09/30/2016  2  % Final  . Band Neutrophils 09/30/2016 2  % Final  . Metamyelocytes Relative 09/30/2016 2  % Final  . Myelocytes 09/30/2016 0  % Final  . Promyelocytes Absolute 09/30/2016 0  % Final  . Blasts 09/30/2016 0  % Final  . nRBC 09/30/2016 0  0 /100 WBC Final  . Other 09/30/2016 0  % Final  . Neutro Abs 09/30/2016 3.7  1.4 - 6.5 K/uL Final  . Lymphs Abs 09/30/2016 2.2  1.0 - 3.6 K/uL Final  . Monocytes Absolute 09/30/2016 0.5  0.2 - 1.0 K/uL Final  . Eosinophils Absolute 09/30/2016 0.3  0 - 0.7 K/uL Final  . Basophils Absolute 09/30/2016 0.1  0 - 0.1 K/uL Final  . Sodium 09/30/2016 142  135 - 145 mmol/L Final  . Potassium 09/30/2016 4.6  3.5 - 5.1 mmol/L Final  . Chloride 09/30/2016 105  101 - 111 mmol/L Final  . CO2 09/30/2016 30  22 - 32 mmol/L Final  . Glucose, Bld 09/30/2016 118* 65 - 99 mg/dL Final  . BUN 09/30/2016 10  6 - 20 mg/dL Final  . Creatinine, Ser 09/30/2016 0.89  0.61 - 1.24 mg/dL Final  . Calcium 09/30/2016 9.5  8.9 - 10.3 mg/dL Final  .  Total Protein 09/30/2016 7.3  6.5 - 8.1 g/dL Final  . Albumin 09/30/2016 4.6  3.5 - 5.0 g/dL Final  . AST 09/30/2016 20  15 - 41 U/L Final  . ALT 09/30/2016 18  17 - 63 U/L Final  . Alkaline Phosphatase 09/30/2016 59  38 - 126 U/L Final  . Total Bilirubin 09/30/2016 0.3  0.3 - 1.2 mg/dL Final  . GFR calc non Af Amer 09/30/2016 >60  >60 mL/min Final  . GFR calc Af Amer 09/30/2016 >60  >60 mL/min Final   Comment: (NOTE) The eGFR has been calculated using the CKD EPI equation. This calculation has not been validated in all clinical situations. eGFR's persistently <60 mL/min signify possible Chronic Kidney Disease.   Georgiann Hahn gap 09/30/2016 7  5 - 15 Final    Assessment:  Edgar [name] is a 26 y.o. male with stage IIB left testicular cancer s/p left radical orchiectomy on 11/26/2015.  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  Clinical/pathologic stage is T1 N1-2 S0 M0.  He received 3 cycles of  BEP (12/30/2015 - 02/24/2016).  He received OnPro Neulasta with cycle #2 and #3.  He declined evaluation for retroperitoneal lymph node dissection (RPLND).  He declined sperm banking.  Pre surgery labs on 11/20/2015 revealed an AFP of 411.9, beta-HCG 2,669, and LDH 522 (121-224).    Tumor markers have been followed:   AFP was 411.9 (0-8.3) on 11/20/2015, 11.2 on 12/19/2015, 6.3 on 12/23/2015, 1.3 on 01/27/2016, 1.8 on 02/17/2016, 1.9 on 03/30/2016, 1.4 on 05/27/2016, and 1.0 on 07/29/2016.   Beta-HCG was 2,669 (0-3) on 11/20/2015, 2 on 12/19/2015, 1 on 12/23/2015, < 1 on 01/27/2016, < 1 on 02/17/2016, < 1 on 03/30/2016, < 1 on 05/27/2016, and < 1 on 0516/2018.   LDH was 522 (98-192) on 11/20/2015, 179 on 12/19/2015, 160 on 12/23/2015, 202 on 01/27/2016, 156 on 02/10/2016, 161 on 02/17/2016, 172 on 03/30/2016, 178 on 05/27/2016, and 150 on 07/29/2016.  Chest, abdomen, and pelvic CT  scan on 09/30/2016 revealed no evidence of recurrent or metastatic disease.   Pulmonary function testing revealed a FVC 4.71 liters (  76% predicted), FEV1 4.18 liters (88% predicted), TLC 7.23 liters (87% predicted), VC 4.88 (79% predicted) and DLCO of 39.3 mL/mmHg/min (100% predicted).   He smokes 1/2 pack per day.  Symptomatically, he feels back to normal.  He has a little numbness/tingling in his fingertips.  Exam is unremakable.  He is still smoking.  Plan: 1.  Labs today: AFP, beta-HCG, LDH. 2.  Review interval CT scan.  No evidence of metastatic disease. 3.  Call patient with lab results. 4.  Continue surveillance in year 1 (H&P every 2 months, abdomen/pelvic CT scan every 6 months , CXR every 6 months), year 2 (H&P every 3 months, abdomen/pelvic CT scan every 6-12 months , CXR every 6 months), year 3 (H&P every 6 months, abdomen/pelvic CT scan every year , CXR every year), year 4 (H&P every 6 months, CXR every year), and year 5 (H&P every 6 months). 5.  Port flush today.  Patient wishes to keep port at this time. 6.  Encourage smoking cessation.  [facility] 7.  RTC in 2 month for MD assessment and labs (CBC with diff, CMP, AFP, LDH, beta-HCG).   [name], MD  10/07/2016, 6:04 AM

## 2016-10-13 NOTE — Progress Notes (Signed)
[facility] day:  10/14/2016  Chief Complaint: Edgar [name] is a 26 y.o. male with stage IIB testicular cancer who is seen for 2 month assessment.  HPI: The patient was last seen in the medical oncology clinic by me on 07/29/2016.  At that time, he felt "back to normal".  He was working 12 hour shifts. His neuropathy had improved.  He was smoking 1/2-1/3 pack a day.  AFP, beta-HCG, and LDH were normal.  Chest, abdomen, and pelvic CT scan on 09/30/2016 revealed no evidence of recurrent or metastatic disease.  Labs on 09/30/2016 revealed a normal CBC with diff and CMP.  During the interim, he has done well.  He voices no concerns.  He is working full time.   Past Medical History:  Diagnosis Date  . Asthma    AS A CHILD-NO INHALERS  . Cancer ([place])    testicular cancer 11/2016 per pt   . GERD (gastroesophageal reflux disease)    TUMS PRN    Past Surgical History:  Procedure Laterality Date  . ANKLE FRACTURE SURGERY Right 2004  . ORCHIECTOMY Left 11/26/2015   Procedure: ORCHIECTOMY;  Surgeon: [name], MD;  Location: [facility];  Service: Urology;  Laterality: Left;  radical/Inguinal approach  . PERIPHERAL VASCULAR CATHETERIZATION N/A 12/16/2015   Procedure: [name] Cath Insertion;  Surgeon: [name], MD;  Location: [facility];  Service: Cardiovascular;  Laterality: N/A;  . WISDOM TOOTH EXTRACTION  2017    Family History  Problem Relation Age of Onset  . Skin cancer Mother   . Breast cancer Maternal Grandmother   . Colon cancer Maternal Grandfather   . Diabetes Maternal Grandfather   . Prostate cancer Neg Hx   . Kidney cancer Neg Hx   . Bladder Cancer Neg Hx     Social History:  reports that he has been smoking Cigarettes.  He has a 4.00 pack-year smoking history. He has quit using smokeless tobacco. His smokeless tobacco use included Snuff. He reports that he uses drugs, including Marijuana. He reports that he does not  drink alcohol.  He smokes 1/3 pack a day.  He smokes marijuana.  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  He has a 64 year-old-daughter named [name]. The patient's mother's cell phone number is [phone].  He is working full time.  He just got back from [facility].  The patient lives in [place].  Allergies: No Known Allergies  Current Medications: Current Outpatient Prescriptions  Medication Sig Dispense Refill  . acetaminophen (TYLENOL) 325 MG tablet Take 650 mg by mouth every 6 (six) hours as needed.    Marland Kitchen dexamethasone (DECADRON) 4 MG tablet Take 2 tablets by mouth once a day on the day starting on day 6 of chemotherapy and then take 2 tablets two times a day for 2 days. Take with food. (Patient not taking: Reported on 10/14/2016) 30 tablet 1  . lidocaine-prilocaine (EMLA) cream Apply small amount of cream over the portacath site 1 hour before chemotherapy treatment, place saran wrap over the cream to protect your clothing (Patient not taking: Reported on 10/14/2016) 30 g 1  . LORazepam (ATIVAN) 0.5 MG tablet Take 1 tablet (0.5 mg total) by mouth every 6 (six) hours as needed (Nausea or vomiting). (Patient not taking: Reported on 10/14/2016) 30 tablet 0   No current facility-administered medications for this visit.     Review of Systems:  GENERAL:  Feels good.  No fevers or sweats.  Weight down 3 pounds. PERFORMANCE STATUS (ECOG):  0 HEENT:  No visual changes, runny nose, sore throat, mouth sores or tenderness. Lungs: No shortness of breath or cough.  No hemoptysis. Cardiac:  No chest pain, palpitations, orthopnea, or PND. GI:  No nausea, vomiting, diarrhea, constipation, melena or hematochezia. GU:  No urgency, frequency, dysuria, or hematuria. Musculoskeletal:  No back pain.  No joint pain.  No muscle tenderness. Extremities:  No pain or swelling. Skin:  No rashes, ulcers, or skin changes. Neuro:  No headache, numbness or weakness, balance or  coordination issues. Endocrine:  No diabetes, thyroid issues, hot flashes or night sweats. Psych:  No mood changes, depression or anxiety. Pain:  No pain. Review of systems:  All other systems reviewed and found to be negative.  Physical Exam:  Blood pressure 124/86, pulse 76, temperature 98.3 F (36.8 C), temperature source Tympanic, resp. rate 20, weight 261 lb 14.5 oz (118.8 kg). GENERAL:  Well developed, well nourished, gentleman sitting comfortably in the exam room in no acute distress. MENTAL STATUS:  Alert and oriented to person, place and time. HEAD:  Brown curly hair.  Full beard.  Normocephalic, atraumatic, face symmetric, no Cushingoid features. EYES:  Pupils equal round and reactive to light and accomodation.  No conjunctivitis or scleral icterus. ENT:  Oropharynx clear without lesion.  Tongue normal. Mucous membranes moist.  RESPIRATORY:  Clear to auscultation without rales, wheezes or rhonchi. CARDIOVASCULAR:  Regular rate and rhythm without murmur, rub or gallop. ABDOMEN:  Soft, non-tender, with active bowel sounds, and no appreciable hepatosplenomegaly.  No masses. GU:  Right testicle without masses or tenderness.  s/p left orchiectomy with well healed inguinal incision and no mass. SKIN:  No rashes, ulcers or lesions. EXTREMITIES: No edema, no skin discoloration or tenderness.  No palpable cords. LYMPH NODES: No palpable cervical, supraclavicular, axillary or inguinal adenopathy  NEUROLOGICAL: Unremarkable. PSYCH:  Appropriate.   Imaging studies: 11/28/2015:  Chest, abdomen, and pelvic CT revealed no mediastinal adenopathy or suspicious pulmonary nodules.   There were 2 prominent left periaortic retroperitoneal lymph nodes (1.7 cm and 0.9 cm) concerning for testicular nodal metastasis.  There were no abnormal lymph nodes above the renal veins.  There were postsurgical change in the left hemiscrotum and left inguinal canal.  There was a 3.3 cm soft tissue abnormality anterior  to the left iliopsoas muscle which could represent a metastatic lymph node (N2 lesion).  12/17/2015:  Head MRI revealed no evidence of metastatic disease.   03/30/2016:  Chest, abdomen, and pelvic CT revealed interval resolution of left abdominal/pelvic lymphadenopathy.  There was no residual metastatic disease. 09/30/2016:  Chest, abdomen, and pelvic CT revealed no evidence of recurrent or metastatic disease.    Office Visit on 10/14/2016  Component Date Value Ref Range Status  . LDH 10/14/2016 152  98 - 192 U/L Final    Assessment:  Edgar [name] is a 26 y.o. male with stage IIB left testicular cancer s/p left radical orchiectomy on 11/26/2015.  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  Clinical/pathologic stage is T1 N1-2 S0 M0.  He received 3 cycles of  BEP (12/30/2015 - 02/24/2016).  He received OnPro Neulasta with cycle #2 and #3.  He declined evaluation for retroperitoneal lymph node dissection (RPLND).  He declined sperm banking.  Pre surgery labs on 11/20/2015  revealed an AFP of 411.9, beta-HCG 2,669, and LDH 522 (121-224).    Tumor markers have been followed:   AFP was 411.9 (0-8.3) on 11/20/2015, 11.2 on 12/19/2015, 6.3 on 12/23/2015, 1.3 on 01/27/2016, 1.8 on 02/17/2016, 1.9 on 03/30/2016, 1.4 on 05/27/2016, and 1.0 on 07/29/2016.   Beta-HCG was 2,669 (0-3) on 11/20/2015, 2 on 12/19/2015, 1 on 12/23/2015, < 1 on 01/27/2016, < 1 on 02/17/2016, < 1 on 03/30/2016, < 1 on 05/27/2016, and < 1 on 07/29/2016.   LDH was 522 (98-192) on 11/20/2015, 179 on 12/19/2015, 160 on 12/23/2015, 202 on 01/27/2016, 156 on 02/10/2016, 161 on 02/17/2016, 172 on 03/30/2016, 178 on 05/27/2016, 150 on 07/29/2016, and 152 on 10/14/2016.  Chest, abdomen, and pelvic CT scan on 09/30/2016 revealed no evidence of recurrent or metastatic disease.   Pulmonary function testing revealed a FVC  4.71 liters (76% predicted), FEV1 4.18 liters (88% predicted), TLC 7.23 liters (87% predicted), VC 4.88 (79% predicted) and DLCO of 39.3 mL/mmHg/min (100% predicted).   He smokes 1/2 pack per day.  Symptomatically, he denies any complaint.  Exam is normal.  Plan: 1.  Labs today: AFP, beta-HCG, LDH. 2.  Review interval CT scan.  No evidence of metastatic disease. 3.  Call patient with lab results. 4.  Discuss port removal with Dr. Lucky Cowboy. 5.  Continue surveillance in year 1 (H&P every 2 months, abdomen/pelvic CT scan every 6 months , CXR every 6 months), year 2 (H&P every 3 months, abdomen/pelvic CT scan every 6-12 months , CXR every 6 months), year 3 (H&P every 6 months, abdomen/pelvic CT scan every year , CXR every year), year 4 (H&P every 6 months, CXR every year), and year 5 (H&P every 6 months). 6.  RTC in 2 months for MD assessment and labs (CBC with diff, CMP, AFP, LDH, beta-HCG).   [name], MD  10/14/2016, 9:34 AM

## 2016-10-14 ENCOUNTER — Inpatient Hospital Stay: Payer: BLUE CROSS/BLUE SHIELD | Attending: Hematology and Oncology | Admitting: Hematology and Oncology

## 2016-10-14 ENCOUNTER — Other Ambulatory Visit: Payer: Self-pay | Admitting: *Deleted

## 2016-10-14 ENCOUNTER — Inpatient Hospital Stay: Payer: BLUE CROSS/BLUE SHIELD

## 2016-10-14 ENCOUNTER — Encounter: Payer: Self-pay | Admitting: Hematology and Oncology

## 2016-10-14 VITALS — BP 124/86 | HR 76 | Temp 98.3°F | Resp 20 | Wt 261.9 lb

## 2016-10-14 DIAGNOSIS — Z808 Family history of malignant neoplasm of other organs or systems: Secondary | ICD-10-CM | POA: Diagnosis not present

## 2016-10-14 DIAGNOSIS — C6212 Malignant neoplasm of descended left testis: Secondary | ICD-10-CM

## 2016-10-14 DIAGNOSIS — F17210 Nicotine dependence, cigarettes, uncomplicated: Secondary | ICD-10-CM | POA: Diagnosis not present

## 2016-10-14 DIAGNOSIS — Z8781 Personal history of (healed) traumatic fracture: Secondary | ICD-10-CM | POA: Diagnosis not present

## 2016-10-14 DIAGNOSIS — Z803 Family history of malignant neoplasm of breast: Secondary | ICD-10-CM | POA: Diagnosis not present

## 2016-10-14 DIAGNOSIS — K219 Gastro-esophageal reflux disease without esophagitis: Secondary | ICD-10-CM | POA: Diagnosis not present

## 2016-10-14 DIAGNOSIS — G629 Polyneuropathy, unspecified: Secondary | ICD-10-CM | POA: Insufficient documentation

## 2016-10-14 DIAGNOSIS — Z79899 Other long term (current) drug therapy: Secondary | ICD-10-CM | POA: Insufficient documentation

## 2016-10-14 DIAGNOSIS — Z800 Family history of malignant neoplasm of digestive organs: Secondary | ICD-10-CM | POA: Insufficient documentation

## 2016-10-14 DIAGNOSIS — Z87891 Personal history of nicotine dependence: Secondary | ICD-10-CM

## 2016-10-14 LAB — LACTATE DEHYDROGENASE: LDH: 152 U/L (ref 98–192)

## 2016-10-14 NOTE — Progress Notes (Signed)
Patient here today for CT results.  Offers no complaints. 

## 2016-10-15 LAB — BETA HCG QUANT (REF LAB): hCG Quant: <1 m[IU]/mL (ref 0–3)

## 2016-10-15 LAB — AFP TUMOR MARKER: AFP, Serum, Tumor Marker: 1.4 ng/mL (ref 0.0–8.3)

## 2016-11-20 ENCOUNTER — Encounter: Payer: Self-pay | Admitting: Family Medicine

## 2016-11-20 ENCOUNTER — Ambulatory Visit (INDEPENDENT_AMBULATORY_CARE_PROVIDER_SITE_OTHER): Payer: BLUE CROSS/BLUE SHIELD | Admitting: Family Medicine

## 2016-11-20 VITALS — BP 137/87 | HR 86 | Temp 98.0°F | Ht 72.7 in | Wt 261.0 lb

## 2016-11-20 DIAGNOSIS — F321 Major depressive disorder, single episode, moderate: Secondary | ICD-10-CM | POA: Diagnosis not present

## 2016-11-20 DIAGNOSIS — C6212 Malignant neoplasm of descended left testis: Secondary | ICD-10-CM | POA: Diagnosis not present

## 2016-11-20 MED ORDER — BUSPIRONE HCL 5 MG PO TABS: 5.0000 mg | ORAL_TABLET | Freq: Three times a day (TID) | ORAL | 3 refills | Status: DC

## 2016-11-20 MED ORDER — SERTRALINE HCL 50 MG PO TABS: ORAL_TABLET | ORAL | 2 refills | Status: DC

## 2016-11-20 NOTE — Assessment & Plan Note (Signed)
Not under good control. Will start zoloft and buspar. Recheck 3-4 weeks. Call with any concerns.

## 2016-11-20 NOTE — Assessment & Plan Note (Signed)
Continue to follow with oncology. Call with any concerns. Continue to monitor.  

## 2016-11-20 NOTE — Progress Notes (Signed)
BP 137/87 (BP Location: Left Arm, Patient Position: Sitting, Cuff Size: Large)   Pulse 86   Temp 98 F (36.7 C)   Ht 6' 0.7" (1.847 m)   Wt 261 lb (118.4 kg)   SpO2 96%   BMI 34.72 kg/m    Subjective:    Patient ID: Edgar [name], male    DOB: May 07, 1990, 26 y.o.   MRN: [id number]  HPI: Edgar [name] is a 26 y.o. male who presents today to establish care  Chief Complaint  Patient presents with  . Establish Care   History of stage IIB L testicular cancer s/p radical L orchiectomy on 11/26/15. Pathology showed that it was limited to the testes without lymphovascular invasion. He has been receiving chemotherapy and following with oncology. He last saw them at the beginning of August. At that time he showed no evidence of recurrent or metastatic disease. He was going to have his port removed. He is now on surveillance with oncology and they are following him with visits every 2 months. CT abdomen and pelvis every 6 months and CXR every 6 months.   DEPRESSION/ANXIETY- when he went back to work, he got promoted and has been feeling really stressed since then.  Mood status: uncontrolled- about a year Satisfied with current treatment?: no Symptom severity: moderate  Duration of current treatment : not on anything right now Side effects: no  Previous psychiatric medications: lorazepam Depressed mood: yes Anxious mood: yes Anhedonia: no Significant weight loss or gain: no Insomnia: yes hard to stay asleep Fatigue: no Feelings of worthlessness or guilt: no Impaired concentration/indecisiveness: no Suicidal ideations: no Hopelessness: no Crying spells: no Depression screen PHQ 2/9 11/20/2016  Decreased Interest 0  Down, Depressed, Hopeless 2  PHQ - 2 Score 2  Altered sleeping 3  Tired, decreased energy 3  Change in appetite 2  Feeling bad or failure about yourself  1  Trouble concentrating 2  Moving slowly or fidgety/restless 3  Suicidal thoughts 0  PHQ-9 Score 16   GAD  7 : Generalized Anxiety Score 11/20/2016  Nervous, Anxious, on Edge 2  Control/stop worrying 2  Worry too much - different things 2  Trouble relaxing 2  Restless 2  Easily annoyed or irritable 1  Afraid - awful might happen 0  Total GAD 7 Score 11    Active Ambulatory Problems    Diagnosis Date Noted  . Testicular cancer ([place]) 11/26/2015  . Encounter for antineoplastic chemotherapy 01/19/2016  . Current moderate episode of major depressive disorder without prior episode ([place]) 11/20/2016   Resolved Ambulatory Problems    Diagnosis Date Noted  . No Resolved Ambulatory Problems   Past Medical History:  Diagnosis Date  . Asthma   . Cancer ([place])   . GERD (gastroesophageal reflux disease)    Past Surgical History:  Procedure Laterality Date  . ANKLE FRACTURE SURGERY Right 2004  . ORCHIECTOMY Left 11/26/2015   Procedure: ORCHIECTOMY;  Surgeon: [name], MD;  Location: [facility];  Service: Urology;  Laterality: Left;  radical/Inguinal approach  . PERIPHERAL VASCULAR CATHETERIZATION N/A 12/16/2015   Procedure: [name] Cath Insertion;  Surgeon: [name], MD;  Location: [facility];  Service: Cardiovascular;  Laterality: N/A;  . WISDOM TOOTH EXTRACTION  2017   Outpatient Encounter Prescriptions as of 11/20/2016  Medication Sig  . busPIRone (BUSPAR) 5 MG tablet Take 1-3 tablets (5-15 mg total) by mouth 3 (three) times daily.  . sertraline (ZOLOFT) 50 MG tablet 1/2 tab  a day for 1 week, 1 tab daily after that  . [DISCONTINUED] acetaminophen (TYLENOL) 325 MG tablet Take 650 mg by mouth every 6 (six) hours as needed.  . [DISCONTINUED] dexamethasone (DECADRON) 4 MG tablet Take 2 tablets by mouth once a day on the day starting on day 6 of chemotherapy and then take 2 tablets two times a day for 2 days. Take with food. (Patient not taking: Reported on 10/14/2016)  . [DISCONTINUED] lidocaine-prilocaine (EMLA) cream Apply small amount of cream over the portacath site 1 hour before  chemotherapy treatment, place saran wrap over the cream to protect your clothing (Patient not taking: Reported on 10/14/2016)  . [DISCONTINUED] LORazepam (ATIVAN) 0.5 MG tablet Take 1 tablet (0.5 mg total) by mouth every 6 (six) hours as needed (Nausea or vomiting). (Patient not taking: Reported on 10/14/2016)   No facility-administered encounter medications on file as of 11/20/2016.    No Known Allergies  Family History  Problem Relation Age of Onset  . Skin cancer Mother   . Breast cancer Maternal Grandmother   . Colon cancer Maternal Grandfather   . Diabetes Maternal Grandfather   . Emphysema Father   . Mental illness Sister        anxiety  . Stroke Paternal Grandmother   . Prostate cancer Neg Hx   . Kidney cancer Neg Hx   . Bladder Cancer Neg Hx    Past Surgical History:  Procedure Laterality Date  . ANKLE FRACTURE SURGERY Right 2004  . ORCHIECTOMY Left 11/26/2015   Procedure: ORCHIECTOMY;  Surgeon: [name], MD;  Location: [facility];  Service: Urology;  Laterality: Left;  radical/Inguinal approach  . PERIPHERAL VASCULAR CATHETERIZATION N/A 12/16/2015   Procedure: [name] Cath Insertion;  Surgeon: [name], MD;  Location: [facility];  Service: Cardiovascular;  Laterality: N/A;  . WISDOM TOOTH EXTRACTION  2017    Review of Systems  Constitutional: Negative.   Respiratory: Negative.   Cardiovascular: Negative.   Psychiatric/Behavioral: Positive for dysphoric mood and sleep disturbance. Negative for agitation, behavioral problems, confusion, decreased concentration, hallucinations, self-injury and suicidal ideas. The patient is nervous/anxious. The patient is not hyperactive.     Per HPI unless specifically indicated above     Objective:    BP 137/87 (BP Location: Left Arm, Patient Position: Sitting, Cuff Size: Large)   Pulse 86   Temp 98 F (36.7 C)   Ht 6' 0.7" (1.847 m)   Wt 261 lb (118.4 kg)   SpO2 96%   BMI 34.72 kg/m   Wt Readings from Last 3  Encounters:  11/20/16 261 lb (118.4 kg)  10/14/16 261 lb 14.5 oz (118.8 kg)  07/29/16 264 lb 8.8 oz (120 kg)    Physical Exam  Constitutional: He is oriented to person, place, and time. He appears well-developed and well-nourished. No distress.  HENT:  Head: Normocephalic and atraumatic.  Right Ear: Hearing normal.  Left Ear: Hearing normal.  Nose: Nose normal.  Eyes: Conjunctivae and lids are normal. Right eye exhibits no discharge. Left eye exhibits no discharge. No scleral icterus.  Cardiovascular: Normal rate, regular rhythm, normal heart sounds and intact distal pulses.  Exam reveals no gallop and no friction rub.   No murmur heard. Pulmonary/Chest: Effort normal and breath sounds normal. No respiratory distress. He has no wheezes. He has no rales. He exhibits no tenderness.  Musculoskeletal: Normal range of motion.  Neurological: He is alert and oriented to person, place, and time.  Skin: Skin is warm,  dry and intact. No rash noted. He is not diaphoretic. No erythema. No pallor.  Psychiatric: His speech is normal and behavior is normal. Judgment and thought content normal. His mood appears anxious. Cognition and memory are normal.  Nursing note and vitals reviewed.   Results for orders placed or performed in visit on 10/14/16  AFP tumor marker  Result Value Ref Range   AFP, Serum, Tumor Marker 1.4 0.0 - 8.3 ng/mL  Beta HCG, Quant  Result Value Ref Range   hCG Quant <1 0 - 3 mIU/mL  Lactate dehydrogenase  Result Value Ref Range   LDH 152 98 - 192 U/L      Assessment & Plan:   Problem List Items Addressed This Visit      Genitourinary   Testicular cancer ([place])    Continue to follow with oncology. Call with any concerns. Continue to monitor.         Other   Current moderate episode of major depressive disorder without prior episode ([place]) - Primary    Not under good control. Will start zoloft and buspar. Recheck 3-4 weeks. Call with any concerns.       Relevant  Medications   sertraline (ZOLOFT) 50 MG tablet   busPIRone (BUSPAR) 5 MG tablet       Follow up plan: Return 3-4 weeks, for follow up mood.

## 2016-12-07 ENCOUNTER — Ambulatory Visit: Payer: BLUE CROSS/BLUE SHIELD | Admitting: Family Medicine

## 2016-12-08 NOTE — Progress Notes (Deleted)
[facility] day:  12/08/2016  Chief Complaint: Edgar [name] is a 26 y.o. male with stage IIB testicular cancer who is seen for 2 month assessment.  HPI: The patient was last seen in the medical oncology clinic by me on 10/14/2016.  At that time, patient had no physical complaints to report. He felt "back to normal" overall. He continued to smoke daily. Beta hCG <1. AFP 1.4. LDH 152. He had plans to discuss port removal with Dr. Lucky Cowboy.  Patient was back to work on a full time basis.   During the interim, patient has been seen by his PCP for issues with anxiety. He was experiencing difficulties with sleep. On 11/20/2016 patient was started on Buspar 5 -15mg  three times a day, and Zoloft 50 mg daily.  Symptomatically,   Past Medical History:  Diagnosis Date  . Asthma    AS A CHILD-NO INHALERS  . Cancer ([place])    testicular cancer 11/2016 per pt   . GERD (gastroesophageal reflux disease)    TUMS PRN    Past Surgical History:  Procedure Laterality Date  . ANKLE FRACTURE SURGERY Right 2004  . ORCHIECTOMY Left 11/26/2015   Procedure: ORCHIECTOMY;  Surgeon: [name], MD;  Location: [facility];  Service: Urology;  Laterality: Left;  radical/Inguinal approach  . PERIPHERAL VASCULAR CATHETERIZATION N/A 12/16/2015   Procedure: [name] Cath Insertion;  Surgeon: [name], MD;  Location: [facility];  Service: Cardiovascular;  Laterality: N/A;  . WISDOM TOOTH EXTRACTION  2017    Family History  Problem Relation Age of Onset  . Skin cancer Mother   . Breast cancer Maternal Grandmother   . Colon cancer Maternal Grandfather   . Diabetes Maternal Grandfather   . Emphysema Father   . Mental illness Sister        anxiety  . Stroke Paternal Grandmother   . Prostate cancer Neg Hx   . Kidney cancer Neg Hx   . Bladder Cancer Neg Hx     Social History:  reports that he has been smoking Cigarettes.  He has a 4.00 pack-year smoking history.  He has quit using smokeless tobacco. His smokeless tobacco use included Snuff. He reports that he uses drugs, including Marijuana. He reports that he does not drink alcohol.  He smokes 1/3 pack a day.  He smokes marijuana.  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  He has a 17 year-old-daughter named [name]. The patient's mother's cell phone number is [phone].  He is working full time.  He just got back from [employer].  The patient lives in [place].  Allergies: No Known Allergies  Current Medications: Current Outpatient Prescriptions  Medication Sig Dispense Refill  . busPIRone (BUSPAR) 5 MG tablet Take 1-3 tablets (5-15 mg total) by mouth 3 (three) times daily. 90 tablet 3  . sertraline (ZOLOFT) 50 MG tablet 1/2 tab a day for 1 week, 1 tab daily after that 30 tablet 2   No current facility-administered medications for this visit.     Review of Systems:  GENERAL:  Feels good.  No fevers or sweats.  Weight  PERFORMANCE STATUS (ECOG):  0 HEENT:  No visual changes, runny nose, sore throat, mouth sores or tenderness. Lungs: No shortness of breath or cough.  No hemoptysis. Cardiac:  No chest pain, palpitations, orthopnea, or PND. GI:  No nausea, vomiting, diarrhea, constipation, melena or hematochezia. GU:  No urgency, frequency, dysuria, or hematuria. Musculoskeletal:  No back pain.  No joint pain.  No muscle tenderness. Extremities:  No pain or swelling. Skin:  No rashes, ulcers, or skin changes. Neuro:  No headache, numbness or weakness, balance or coordination issues. Endocrine:  No diabetes, thyroid issues, hot flashes or night sweats. Psych:  No mood changes, depression or anxiety. Pain:  No pain. Review of systems:  All other systems reviewed and found to be negative.  Physical Exam:  There were no vitals taken for this visit. GENERAL:  Well developed, well nourished, gentleman sitting comfortably in the exam room in no acute  distress. MENTAL STATUS:  Alert and oriented to person, place and time. HEAD:  Brown curly hair.  Full beard.  Normocephalic, atraumatic, face symmetric, no Cushingoid features. EYES:  Pupils equal round and reactive to light and accomodation.  No conjunctivitis or scleral icterus. ENT:  Oropharynx clear without lesion.  Tongue normal. Mucous membranes moist.  RESPIRATORY:  Clear to auscultation without rales, wheezes or rhonchi. CARDIOVASCULAR:  Regular rate and rhythm without murmur, rub or gallop. ABDOMEN:  Soft, non-tender, with active bowel sounds, and no appreciable hepatosplenomegaly.  No masses. GU:  Right testicle without masses or tenderness.  s/p left orchiectomy with well healed inguinal incision and no mass. SKIN:  No rashes, ulcers or lesions. EXTREMITIES: No edema, no skin discoloration or tenderness.  No palpable cords. LYMPH NODES: No palpable cervical, supraclavicular, axillary or inguinal adenopathy  NEUROLOGICAL: Unremarkable. PSYCH:  Appropriate.   Imaging studies: 11/28/2015:  Chest, abdomen, and pelvic CT revealed no mediastinal adenopathy or suspicious pulmonary nodules.   There were 2 prominent left periaortic retroperitoneal lymph nodes (1.7 cm and 0.9 cm) concerning for testicular nodal metastasis.  There were no abnormal lymph nodes above the renal veins.  There were postsurgical change in the left hemiscrotum and left inguinal canal.  There was a 3.3 cm soft tissue abnormality anterior to the left iliopsoas muscle which could represent a metastatic lymph node (N2 lesion).  12/17/2015:  Head MRI revealed no evidence of metastatic disease.   03/30/2016:  Chest, abdomen, and pelvic CT revealed interval resolution of left abdominal/pelvic lymphadenopathy.  There was no residual metastatic disease. 09/30/2016:  Chest, abdomen, and pelvic CT revealed no evidence of recurrent or metastatic disease.    No visits with results within 3 Day(s) from this visit.  Latest known  visit with results is:  Office Visit on 10/14/2016  Component Date Value Ref Range Status  . AFP, Serum, Tumor Marker 10/14/2016 1.4  0.0 - 8.3 ng/mL Final   Comment: (NOTE) [employer] ECLIA methodology Performed At: [facility] [place], [place] [id number] Lindon Romp MD [id number]   . hCG Quant 10/14/2016 <1  0 - 3 mIU/mL Final   Comment: (NOTE) Roche ECLIA methodology Performed At: [facility] [place], [place] [id number] Lindon Romp MD [id number]   . LDH 10/14/2016 152  98 - 192 U/L Final    Assessment:  Edgar [name] is a 26 y.o. male with stage IIB left testicular cancer s/p left radical orchiectomy on 11/26/2015.  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  Clinical/pathologic stage is T1 N1-2 S0 M0.  He received 3 cycles of  BEP (12/30/2015 - 02/24/2016).  He received OnPro Neulasta with cycle #2 and #3.  He declined evaluation for retroperitoneal lymph  node dissection (RPLND).  He declined sperm banking.  Pre surgery labs on 11/20/2015 revealed an AFP of 411.9, beta-HCG 2,669, and LDH 522 (121-224).    Tumor markers have been followed:   AFP was 411.9 (0-8.3) on 11/20/2015, 11.2 on 12/19/2015, 6.3 on 12/23/2015, 1.3 on 01/27/2016, 1.8 on 02/17/2016, 1.9 on 03/30/2016, 1.4 on 05/27/2016, 1.0 on 07/29/2016, and 1.4 on 10/14/2016.   Beta-HCG was 2,669 (0-3) on 11/20/2015, 2 on 12/19/2015, 1 on 12/23/2015, < 1 on 01/27/2016, < 1 on 02/17/2016, < 1 on 03/30/2016, < 1 on 05/27/2016,  < 1 on 07/29/2016, and < 1 on 10/14/2016.   LDH was 522 (98-192) on 11/20/2015, 179 on 12/19/2015, 160 on 12/23/2015, 202 on 01/27/2016, 156 on 02/10/2016, 161 on 02/17/2016, 172 on 03/30/2016, 178 on 05/27/2016, 150 on 07/29/2016, and 152 on 10/14/2016.  Chest, abdomen, and pelvic CT scan on 09/30/2016 revealed no evidence of  recurrent or metastatic disease.   Pulmonary function testing revealed a FVC 4.71 liters (76% predicted), FEV1 4.18 liters (88% predicted), TLC 7.23 liters (87% predicted), VC 4.88 (79% predicted) and DLCO of 39.3 mL/mmHg/min (100% predicted).   He smokes 1/2 pack per day.  Symptomatically, he denies any complaint.  Exam is normal.  Plan: 1.  Labs today: CBC with differential, CMP, AFP, LDH, beta hCG 2.  Discuss port removal with Dr. Lucky Cowboy. 3.  Continue surveillance in year 1 (H&P every 2 months, abdomen/pelvic CT scan every 6 months , CXR every 6 months), year 2 (H&P every 3 months, abdomen/pelvic CT scan every 6-12 months , CXR every 6 months), year 3 (H&P every 6 months, abdomen/pelvic CT scan every year , CXR every year), year 4 (H&P every 6 months, CXR every year), and year 5 (H&P every 6 months). Last CT was on 09/30/2016. 4.  Anticipate next chest, abdomen, pelvic CT scan on 04/02/2017. 5.  RTC in 2 months for MD assessment and labs (CBC with diff, CMP, AFP, LDH, beta-HCG).   Honor Loh, NP  12/08/2016, 2:26 PM   I saw and evaluated the patient, participating in the key portions of the service and reviewing pertinent diagnostic studies and records.  I reviewed the nurse practitioner's note and agree with the findings and the plan.  The assessment and plan were discussed with the patient.  A few questions were asked by the patient and answered.   [name], MD 12/09/2016,3:12 AM

## 2016-12-09 ENCOUNTER — Inpatient Hospital Stay: Payer: BLUE CROSS/BLUE SHIELD

## 2016-12-09 ENCOUNTER — Inpatient Hospital Stay: Payer: BLUE CROSS/BLUE SHIELD | Admitting: Hematology and Oncology

## 2016-12-15 NOTE — Progress Notes (Deleted)
[facility] day:  12/15/2016  Chief Complaint: Edgar [name] is a 25 y.o. male with stage IIB testicular cancer who is seen for 2 month assessment.  HPI: The patient was last seen in the medical oncology clinic on 10/14/2016.  At that time, patient had no physical complaints. He felt "back to normal" overall. He continued to smoke daily. Beta hCG <1. AFP 1.4. LDH 152. He had plans to discuss port removal with Dr. Lucky Cowboy.  Patient was back to work on a full time basis.   During the interim, patient has been seen by his PCP for issues with anxiety. He was experiencing difficulties with sleep. On 11/20/2016 patient was started on Buspar 5-15mg  three times a day, and Zoloft 50 mg daily.  Symptomatically,   Past Medical History:  Diagnosis Date  . Asthma    AS A CHILD-NO INHALERS  . Cancer ([place])    testicular cancer 11/2016 per pt   . GERD (gastroesophageal reflux disease)    TUMS PRN    Past Surgical History:  Procedure Laterality Date  . ANKLE FRACTURE SURGERY Right 2004  . ORCHIECTOMY Left 11/26/2015   Procedure: ORCHIECTOMY;  Surgeon: [name], MD;  Location: [facility];  Service: Urology;  Laterality: Left;  radical/Inguinal approach  . PERIPHERAL VASCULAR CATHETERIZATION N/A 12/16/2015   Procedure: [name] Cath Insertion;  Surgeon: [name], MD;  Location: [facility];  Service: Cardiovascular;  Laterality: N/A;  . WISDOM TOOTH EXTRACTION  2017    Family History  Problem Relation Age of Onset  . Skin cancer Mother   . Breast cancer Maternal Grandmother   . Colon cancer Maternal Grandfather   . Diabetes Maternal Grandfather   . Emphysema Father   . Mental illness Sister        anxiety  . Stroke Paternal Grandmother   . Prostate cancer Neg Hx   . Kidney cancer Neg Hx   . Bladder Cancer Neg Hx     Social History:  reports that he has been smoking Cigarettes.  He has a 4.00 pack-year smoking history. He has quit using  smokeless tobacco. His smokeless tobacco use included Snuff. He reports that he uses drugs, including Marijuana. He reports that he does not drink alcohol.  He smokes 1/3 pack a day.  He smokes marijuana.  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  He has a 65 year-old-daughter named [name]. The patient's mother's cell phone number is [phone].  He is working full time.  He just got back from [facility].  The patient lives in [place].  Allergies: No Known Allergies  Current Medications: Current Outpatient Prescriptions  Medication Sig Dispense Refill  . busPIRone (BUSPAR) 5 MG tablet Take 1-3 tablets (5-15 mg total) by mouth 3 (three) times daily. 90 tablet 3  . sertraline (ZOLOFT) 50 MG tablet 1/2 tab a day for 1 week, 1 tab daily after that 30 tablet 2   No current facility-administered medications for this visit.     Review of Systems:  GENERAL:  Feels good.  No fevers or sweats.  Weight  PERFORMANCE STATUS (ECOG):  0 HEENT:  No visual changes, runny nose, sore throat, mouth sores or tenderness. Lungs: No shortness of breath or cough.  No hemoptysis. Cardiac:  No chest pain, palpitations, orthopnea, or PND. GI:  No nausea, vomiting, diarrhea, constipation, melena or hematochezia. GU:  No urgency, frequency, dysuria, or  hematuria. Musculoskeletal:  No back pain.  No joint pain.  No muscle tenderness. Extremities:  No pain or swelling. Skin:  No rashes, ulcers, or skin changes. Neuro:  No headache, numbness or weakness, balance or coordination issues. Endocrine:  No diabetes, thyroid issues, hot flashes or night sweats. Psych:  No mood changes, depression or anxiety. Pain:  No pain. Review of systems:  All other systems reviewed and found to be negative.  Physical Exam:  There were no vitals taken for this visit. GENERAL:  Well developed, well nourished, gentleman sitting comfortably in the exam room in no acute distress. MENTAL STATUS:   Alert and oriented to person, place and time. HEAD:  Brown curly hair.  Full beard.  Normocephalic, atraumatic, face symmetric, no Cushingoid features. EYES:  Pupils equal round and reactive to light and accomodation.  No conjunctivitis or scleral icterus. ENT:  Oropharynx clear without lesion.  Tongue normal. Mucous membranes moist.  RESPIRATORY:  Clear to auscultation without rales, wheezes or rhonchi. CARDIOVASCULAR:  Regular rate and rhythm without murmur, rub or gallop. ABDOMEN:  Soft, non-tender, with active bowel sounds, and no appreciable hepatosplenomegaly.  No masses. GU:  Right testicle without masses or tenderness.  s/p left orchiectomy with well healed inguinal incision and no mass. SKIN:  No rashes, ulcers or lesions. EXTREMITIES: No edema, no skin discoloration or tenderness.  No palpable cords. LYMPH NODES: No palpable cervical, supraclavicular, axillary or inguinal adenopathy  NEUROLOGICAL: Unremarkable. PSYCH:  Appropriate.   Imaging studies: 11/28/2015:  Chest, abdomen, and pelvic CT revealed no mediastinal adenopathy or suspicious pulmonary nodules.   There were 2 prominent left periaortic retroperitoneal lymph nodes (1.7 cm and 0.9 cm) concerning for testicular nodal metastasis.  There were no abnormal lymph nodes above the renal veins.  There were postsurgical change in the left hemiscrotum and left inguinal canal.  There was a 3.3 cm soft tissue abnormality anterior to the left iliopsoas muscle which could represent a metastatic lymph node (N2 lesion).  12/17/2015:  Head MRI revealed no evidence of metastatic disease.   03/30/2016:  Chest, abdomen, and pelvic CT revealed interval resolution of left abdominal/pelvic lymphadenopathy.  There was no residual metastatic disease. 09/30/2016:  Chest, abdomen, and pelvic CT revealed no evidence of recurrent or metastatic disease.    No visits with results within 3 Day(s) from this visit.  Latest known visit with results is:   Office Visit on 10/14/2016  Component Date Value Ref Range Status  . AFP, Serum, Tumor Marker 10/14/2016 1.4  0.0 - 8.3 ng/mL Final   Comment: (NOTE) Roche ECLIA methodology Performed At: [facility] [place], [place] [id number] Lindon Romp MD VV:[id number]   . hCG Quant 10/14/2016 <1  0 - 3 mIU/mL Final   Comment: (NOTE) Roche ECLIA methodology Performed At: [facility] [place], [place] [id number] Lindon Romp MD VO:[id number]   . LDH 10/14/2016 152  98 - 192 U/L Final    Assessment:  Edgar [name] is a 26 y.o. male with stage IIB left testicular cancer s/p left radical orchiectomy on 11/26/2015.  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  Clinical/pathologic stage is T1 N1-2 S0 M0.  He received 3 cycles of  BEP (12/30/2015 - 02/24/2016).  He received OnPro Neulasta with cycle #2 and #3.  He declined evaluation for retroperitoneal lymph node dissection (RPLND).  He  declined sperm banking.  Pre surgery labs on 11/20/2015 revealed an AFP of 411.9, beta-HCG 2,669, and LDH 522 (121-224).    Tumor markers have been followed:   AFP was 411.9 (0-8.3) on 11/20/2015, 11.2 on 12/19/2015, 6.3 on 12/23/2015, 1.3 on 01/27/2016, 1.8 on 02/17/2016, 1.9 on 03/30/2016, 1.4 on 05/27/2016, 1.0 on 07/29/2016, and 1.4 on 10/14/2016.   Beta-HCG was 2,669 (0-3) on 11/20/2015, 2 on 12/19/2015, 1 on 12/23/2015, < 1 on 01/27/2016, < 1 on 02/17/2016, < 1 on 03/30/2016, < 1 on 05/27/2016,  < 1 on 07/29/2016, and < 1 on 10/14/2016.   LDH was 522 (98-192) on 11/20/2015, 179 on 12/19/2015, 160 on 12/23/2015, 202 on 01/27/2016, 156 on 02/10/2016, 161 on 02/17/2016, 172 on 03/30/2016, 178 on 05/27/2016, 150 on 07/29/2016, and 152 on 10/14/2016.  Chest, abdomen, and pelvic CT scan on 09/30/2016 revealed no evidence of recurrent or metastatic  disease.   Pulmonary function testing revealed a FVC 4.71 liters (76% predicted), FEV1 4.18 liters (88% predicted), TLC 7.23 liters (87% predicted), VC 4.88 (79% predicted) and DLCO of 39.3 mL/mmHg/min (100% predicted).   He smokes 1/2 pack per day.  Symptomatically, he denies any complaint.  Exam is normal.  Plan: 1.  Labs today: CBC with differential, CMP, AFP, LDH, beta hCG. 2.  Discuss port removal with Dr. Lucky Cowboy. 3.  Continue surveillance in year 1 (H&P every 2 months, abdomen/pelvic CT scan every 6 months , CXR every 6 months), year 2 (H&P every 3 months, abdomen/pelvic CT scan every 6-12 months , CXR every 6 months), year 3 (H&P every 6 months, abdomen/pelvic CT scan every year , CXR every year), year 4 (H&P every 6 months, CXR every year), and year 5 (H&P every 6 months). Last CT was on 09/30/2016. 4.  Anticipate next chest, abdomen, pelvic CT scan on 04/02/2017. 5.  RTC in 2 months for MD assessment and labs (CBC with diff, CMP, AFP, LDH, beta-HCG).   Honor Loh, NP  12/15/2016, 3:48 PM   I saw and evaluated the patient, participating in the key portions of the service and reviewing pertinent diagnostic studies and records.  I reviewed the nurse practitioner's note and agree with the findings and the plan.  The assessment and plan were discussed with the patient.  Additional diagnostic studies of *** are needed to clarify *** and would change the clinical management.  A few ***multiple questions were asked by the patient and answered.   Nolon Stalls, MD 12/16/2016,3:17 AM

## 2016-12-16 ENCOUNTER — Telehealth: Payer: Self-pay | Admitting: *Deleted

## 2016-12-16 ENCOUNTER — Inpatient Hospital Stay: Payer: BLUE CROSS/BLUE SHIELD

## 2016-12-16 ENCOUNTER — Inpatient Hospital Stay: Payer: BLUE CROSS/BLUE SHIELD | Attending: Hematology and Oncology | Admitting: Hematology and Oncology

## 2016-12-16 DIAGNOSIS — C6212 Malignant neoplasm of descended left testis: Secondary | ICD-10-CM | POA: Insufficient documentation

## 2016-12-16 DIAGNOSIS — Z9079 Acquired absence of other genital organ(s): Secondary | ICD-10-CM | POA: Insufficient documentation

## 2016-12-16 DIAGNOSIS — F17210 Nicotine dependence, cigarettes, uncomplicated: Secondary | ICD-10-CM | POA: Insufficient documentation

## 2016-12-16 DIAGNOSIS — Z79899 Other long term (current) drug therapy: Secondary | ICD-10-CM | POA: Insufficient documentation

## 2016-12-16 DIAGNOSIS — K219 Gastro-esophageal reflux disease without esophagitis: Secondary | ICD-10-CM | POA: Insufficient documentation

## 2016-12-16 DIAGNOSIS — Z87891 Personal history of nicotine dependence: Secondary | ICD-10-CM | POA: Insufficient documentation

## 2016-12-16 DIAGNOSIS — Z801 Family history of malignant neoplasm of trachea, bronchus and lung: Secondary | ICD-10-CM | POA: Insufficient documentation

## 2016-12-16 DIAGNOSIS — Z800 Family history of malignant neoplasm of digestive organs: Secondary | ICD-10-CM | POA: Insufficient documentation

## 2016-12-16 DIAGNOSIS — Z803 Family history of malignant neoplasm of breast: Secondary | ICD-10-CM | POA: Insufficient documentation

## 2016-12-16 DIAGNOSIS — F419 Anxiety disorder, unspecified: Secondary | ICD-10-CM | POA: Insufficient documentation

## 2016-12-16 DIAGNOSIS — G4700 Insomnia, unspecified: Secondary | ICD-10-CM | POA: Insufficient documentation

## 2016-12-16 DIAGNOSIS — Z452 Encounter for adjustment and management of vascular access device: Secondary | ICD-10-CM | POA: Insufficient documentation

## 2016-12-16 DIAGNOSIS — J45909 Unspecified asthma, uncomplicated: Secondary | ICD-10-CM | POA: Insufficient documentation

## 2016-12-16 NOTE — Telephone Encounter (Signed)
Called patient to inquire why he failed his appointment today.  Patient states he has started a new job and thought today was Tuesday.  I asked if he could reschedule for next Wednesday.  He is in agreement. Will have scheduling add him to Wednesday's schedule early since he is working 3rd shift.

## 2016-12-22 NOTE — Progress Notes (Unsigned)
[facility] day:  12/22/2016  Chief Complaint: Edgar [name] is a 26 y.o. male with stage IIB testicular cancer who is seen for 2 month assessment.  HPI: The patient was last seen in the medical oncology clinic on 10/14/2016.  At that time, patient had no physical complaints. He felt "back to normal" overall. He continued to smoke daily. Beta hCG <1. AFP 1.4. LDH 152. He had plans to discuss port removal with Dr. Lucky Cowboy.  Patient was back to work on a full time basis. Patient has missed several follow up appointments with this clinic.   During the interim, patient has been seen by his PCP for issues with anxiety. He was experiencing difficulties with sleep. On 11/20/2016 patient was started on Buspar 5-15mg  three times a day, and Zoloft 50 mg daily.  Symptomatically,   Past Medical History:  Diagnosis Date  . Asthma    AS A CHILD-NO INHALERS  . Cancer ([place])    testicular cancer 11/2016 per pt   . GERD (gastroesophageal reflux disease)    TUMS PRN    Past Surgical History:  Procedure Laterality Date  . ANKLE FRACTURE SURGERY Right 2004  . ORCHIECTOMY Left 11/26/2015   Procedure: ORCHIECTOMY;  Surgeon: [name], MD;  Location: [facility];  Service: Urology;  Laterality: Left;  radical/Inguinal approach  . PERIPHERAL VASCULAR CATHETERIZATION N/A 12/16/2015   Procedure: [name] Cath Insertion;  Surgeon: [name], MD;  Location: [facility];  Service: Cardiovascular;  Laterality: N/A;  . WISDOM TOOTH EXTRACTION  2017    Family History  Problem Relation Age of Onset  . Skin cancer Mother   . Breast cancer Maternal Grandmother   . Colon cancer Maternal Grandfather   . Diabetes Maternal Grandfather   . Emphysema Father   . Mental illness Sister        anxiety  . Stroke Paternal Grandmother   . Prostate cancer Neg Hx   . Kidney cancer Neg Hx   . Bladder Cancer Neg Hx     Social History:  reports that he has been smoking  Cigarettes.  He has a 4.00 pack-year smoking history. He has quit using smokeless tobacco. His smokeless tobacco use included Snuff. He reports that he uses drugs, including Marijuana. He reports that he does not drink alcohol.  He smokes 1/3 pack a day.  He smokes marijuana.  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  He has a 73 year-old-daughter named [name]. The patient's mother's cell phone number is [phone].  He is working full time.  He just got back from [facility].  The patient lives in [place].  Allergies: No Known Allergies  Current Medications: Current Outpatient Prescriptions  Medication Sig Dispense Refill  . busPIRone (BUSPAR) 5 MG tablet Take 1-3 tablets (5-15 mg total) by mouth 3 (three) times daily. 90 tablet 3  . sertraline (ZOLOFT) 50 MG tablet 1/2 tab a day for 1 week, 1 tab daily after that 30 tablet 2   No current facility-administered medications for this visit.     Review of Systems:  GENERAL:  Feels good.  No fevers or sweats.  Weight  PERFORMANCE STATUS (ECOG):  0 HEENT:  No visual changes, runny nose, sore throat, mouth sores or tenderness. Lungs: No shortness of breath or cough.  No hemoptysis. Cardiac:  No chest pain, palpitations, orthopnea, or PND. GI:  No nausea, vomiting, diarrhea, constipation,  melena or hematochezia. GU:  No urgency, frequency, dysuria, or hematuria. Musculoskeletal:  No back pain.  No joint pain.  No muscle tenderness. Extremities:  No pain or swelling. Skin:  No rashes, ulcers, or skin changes. Neuro:  No headache, numbness or weakness, balance or coordination issues. Endocrine:  No diabetes, thyroid issues, hot flashes or night sweats. Psych:  No mood changes, depression or anxiety. Pain:  No pain. Review of systems:  All other systems reviewed and found to be negative.  Physical Exam:  There were no vitals taken for this visit. GENERAL:  Well developed, well nourished, gentleman sitting  comfortably in the exam room in no acute distress. MENTAL STATUS:  Alert and oriented to person, place and time. HEAD:  Brown curly hair.  Full beard.  Normocephalic, atraumatic, face symmetric, no Cushingoid features. EYES:  Pupils equal round and reactive to light and accomodation.  No conjunctivitis or scleral icterus. ENT:  Oropharynx clear without lesion.  Tongue normal. Mucous membranes moist.  RESPIRATORY:  Clear to auscultation without rales, wheezes or rhonchi. CARDIOVASCULAR:  Regular rate and rhythm without murmur, rub or gallop. ABDOMEN:  Soft, non-tender, with active bowel sounds, and no appreciable hepatosplenomegaly.  No masses. GU:  Right testicle without masses or tenderness.  s/p left orchiectomy with well healed inguinal incision and no mass. SKIN:  No rashes, ulcers or lesions. EXTREMITIES: No edema, no skin discoloration or tenderness.  No palpable cords. LYMPH NODES: No palpable cervical, supraclavicular, axillary or inguinal adenopathy  NEUROLOGICAL: Unremarkable. PSYCH:  Appropriate.   Imaging studies: 11/28/2015:  Chest, abdomen, and pelvic CT revealed no mediastinal adenopathy or suspicious pulmonary nodules.   There were 2 prominent left periaortic retroperitoneal lymph nodes (1.7 cm and 0.9 cm) concerning for testicular nodal metastasis.  There were no abnormal lymph nodes above the renal veins.  There were postsurgical change in the left hemiscrotum and left inguinal canal.  There was a 3.3 cm soft tissue abnormality anterior to the left iliopsoas muscle which could represent a metastatic lymph node (N2 lesion).  12/17/2015:  Head MRI revealed no evidence of metastatic disease.   03/30/2016:  Chest, abdomen, and pelvic CT revealed interval resolution of left abdominal/pelvic lymphadenopathy.  There was no residual metastatic disease. 09/30/2016:  Chest, abdomen, and pelvic CT revealed no evidence of recurrent or metastatic disease.    No visits with results within 3  Day(s) from this visit.  Latest known visit with results is:  Office Visit on 10/14/2016  Component Date Value Ref Range Status  . AFP, Serum, Tumor Marker 10/14/2016 1.4  0.0 - 8.3 ng/mL Final   Comment: (NOTE) Roche ECLIA methodology Performed At: [facility] [place], [place] [id number] Lindon Romp MD UD:[id number]   . hCG Quant 10/14/2016 <1  0 - 3 mIU/mL Final   Comment: (NOTE) Roche ECLIA methodology Performed At: [facility] [place], [place] [id number] Lindon Romp MD JO:[id number]   . LDH 10/14/2016 152  98 - 192 U/L Final    Assessment:  Edgar [name] is a 26 y.o. male with stage IIB left testicular cancer s/p left radical orchiectomy on 11/26/2015.  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  Clinical/pathologic stage is T1 N1-2 S0 M0.  He received 3 cycles of  BEP (12/30/2015 - 02/24/2016).  He received OnPro Neulasta with cycle #2 and #3.  He  declined evaluation for retroperitoneal lymph node dissection (RPLND).  He declined sperm banking.  Pre surgery labs on 11/20/2015 revealed an AFP of 411.9, beta-HCG 2,669, and LDH 522 (121-224).    Tumor markers have been followed:   AFP was 411.9 (0-8.3) on 11/20/2015, 11.2 on 12/19/2015, 6.3 on 12/23/2015, 1.3 on 01/27/2016, 1.8 on 02/17/2016, 1.9 on 03/30/2016, 1.4 on 05/27/2016, 1.0 on 07/29/2016, and 1.4 on 10/14/2016.   Beta-HCG was 2,669 (0-3) on 11/20/2015, 2 on 12/19/2015, 1 on 12/23/2015, < 1 on 01/27/2016, < 1 on 02/17/2016, < 1 on 03/30/2016, < 1 on 05/27/2016,  < 1 on 07/29/2016, and < 1 on 10/14/2016.   LDH was 522 (98-192) on 11/20/2015, 179 on 12/19/2015, 160 on 12/23/2015, 202 on 01/27/2016, 156 on 02/10/2016, 161 on 02/17/2016, 172 on 03/30/2016, 178 on 05/27/2016, 150 on 07/29/2016, and 152 on 10/14/2016.  Chest, abdomen, and pelvic CT scan on  09/30/2016 revealed no evidence of recurrent or metastatic disease.   Pulmonary function testing revealed a FVC 4.71 liters (76% predicted), FEV1 4.18 liters (88% predicted), TLC 7.23 liters (87% predicted), VC 4.88 (79% predicted) and DLCO of 39.3 mL/mmHg/min (100% predicted).   He smokes 1/2 pack per day.  Symptomatically, he denies any complaint.  Exam is normal.  Plan: 1.  Labs today: CBC with differential, CMP, AFP, LDH, beta hCG. 2.  Discuss port removal with Dr. Lucky Cowboy. 3.  Discuss continued surveillance in year 1 (H&P every 2 months, abdomen/pelvic CT scan every 6 months , CXR every 6 months), year 2 (H&P every 3 months, abdomen/pelvic CT scan every 6-12 months , CXR every 6 months), year 3 (H&P every 6 months, abdomen/pelvic CT scan every year , CXR every year), year 4 (H&P every 6 months, CXR every year), and year 5 (H&P every 6 months).  4.  Schedule next CT of the abdomen and pelvis on 04/02/2017. 5.  Schedule CXR on 04/02/2017.  6.  RTC in 3 months (after CT and CXR)  for MD assessment, labs (CBC with diff, CMP, AFP, LDH, beta-HCG), and to review CT scans and CXR.    Honor Loh, NP  12/22/2016, 4:39 PM   I saw and evaluated the patient, participating in the key portions of the service and reviewing pertinent diagnostic studies and records.  I reviewed the nurse practitioner's note and agree with the findings and the plan.  The assessment and plan were discussed with the patient.  Additional diagnostic studies of *** are needed to clarify *** and would change the clinical management.  A few ***multiple questions were asked by the patient and answered.   Nolon Stalls, MD 12/22/2016,4:39 PM

## 2016-12-23 ENCOUNTER — Inpatient Hospital Stay: Payer: BLUE CROSS/BLUE SHIELD

## 2016-12-23 ENCOUNTER — Other Ambulatory Visit: Payer: BLUE CROSS/BLUE SHIELD

## 2016-12-23 ENCOUNTER — Inpatient Hospital Stay: Payer: BLUE CROSS/BLUE SHIELD | Admitting: Hematology and Oncology

## 2016-12-23 ENCOUNTER — Ambulatory Visit: Payer: BLUE CROSS/BLUE SHIELD | Admitting: Hematology and Oncology

## 2016-12-24 ENCOUNTER — Inpatient Hospital Stay: Payer: BLUE CROSS/BLUE SHIELD

## 2016-12-24 ENCOUNTER — Encounter: Payer: Self-pay | Admitting: Hematology and Oncology

## 2016-12-24 ENCOUNTER — Inpatient Hospital Stay (HOSPITAL_BASED_OUTPATIENT_CLINIC_OR_DEPARTMENT_OTHER): Payer: BLUE CROSS/BLUE SHIELD | Admitting: Hematology and Oncology

## 2016-12-24 VITALS — BP 132/82 | HR 64 | Temp 98.5°F | Resp 18 | Wt 264.6 lb

## 2016-12-24 DIAGNOSIS — J45909 Unspecified asthma, uncomplicated: Secondary | ICD-10-CM

## 2016-12-24 DIAGNOSIS — G4700 Insomnia, unspecified: Secondary | ICD-10-CM | POA: Diagnosis not present

## 2016-12-24 DIAGNOSIS — C6212 Malignant neoplasm of descended left testis: Secondary | ICD-10-CM | POA: Diagnosis not present

## 2016-12-24 DIAGNOSIS — F17210 Nicotine dependence, cigarettes, uncomplicated: Secondary | ICD-10-CM

## 2016-12-24 DIAGNOSIS — Z9079 Acquired absence of other genital organ(s): Secondary | ICD-10-CM | POA: Diagnosis not present

## 2016-12-24 DIAGNOSIS — Z800 Family history of malignant neoplasm of digestive organs: Secondary | ICD-10-CM | POA: Diagnosis not present

## 2016-12-24 DIAGNOSIS — F419 Anxiety disorder, unspecified: Secondary | ICD-10-CM | POA: Diagnosis not present

## 2016-12-24 DIAGNOSIS — K219 Gastro-esophageal reflux disease without esophagitis: Secondary | ICD-10-CM | POA: Diagnosis not present

## 2016-12-24 DIAGNOSIS — Z452 Encounter for adjustment and management of vascular access device: Secondary | ICD-10-CM

## 2016-12-24 DIAGNOSIS — Z801 Family history of malignant neoplasm of trachea, bronchus and lung: Secondary | ICD-10-CM

## 2016-12-24 DIAGNOSIS — Z803 Family history of malignant neoplasm of breast: Secondary | ICD-10-CM

## 2016-12-24 DIAGNOSIS — Z79899 Other long term (current) drug therapy: Secondary | ICD-10-CM

## 2016-12-24 DIAGNOSIS — Z87891 Personal history of nicotine dependence: Secondary | ICD-10-CM

## 2016-12-24 LAB — COMPREHENSIVE METABOLIC PANEL
ALT: 22 U/L (ref 17–63)
AST: 23 U/L (ref 15–41)
Albumin: 4.5 g/dL (ref 3.5–5.0)
Alkaline Phosphatase: 51 U/L (ref 38–126)
Anion gap: 10 (ref 5–15)
BUN: 16 mg/dL (ref 6–20)
CO2: 26 mmol/L (ref 22–32)
Calcium: 9.2 mg/dL (ref 8.9–10.3)
Chloride: 102 mmol/L (ref 101–111)
Creatinine, Ser: 0.85 mg/dL (ref 0.61–1.24)
GFR calc Af Amer: >60 mL/min (ref >60)
GFR calc non Af Amer: >60 mL/min (ref >60)
Glucose, Bld: 114 mg/dL — ABNORMAL HIGH (ref 65–99)
Potassium: 4.2 mmol/L (ref 3.5–5.1)
Sodium: 138 mmol/L (ref 135–145)
Total Bilirubin: 0.5 mg/dL (ref 0.3–1.2)
Total Protein: 7.2 g/dL (ref 6.5–8.1)

## 2016-12-24 LAB — CBC WITH DIFFERENTIAL/PLATELET
Basophils Absolute: 0.1 10*3/uL (ref 0–0.1)
Basophils Relative: 1 %
Eosinophils Absolute: 0.2 10*3/uL (ref 0–0.7)
Eosinophils Relative: 4 %
HCT: 42 % (ref 40.0–52.0)
Hemoglobin: 13.8 g/dL (ref 13.0–18.0)
Lymphocytes Relative: 16 %
Lymphs Abs: 1 10*3/uL (ref 1.0–3.6)
MCH: 30.2 pg (ref 26.0–34.0)
MCHC: 32.8 g/dL (ref 32.0–36.0)
MCV: 92.1 fL (ref 80.0–100.0)
Monocytes Absolute: 0.5 10*3/uL (ref 0.2–1.0)
Monocytes Relative: 8 %
Neutro Abs: 4.5 10*3/uL (ref 1.4–6.5)
Neutrophils Relative %: 71 %
Platelets: 301 10*3/uL (ref 150–440)
RBC: 4.56 MIL/uL (ref 4.40–5.90)
RDW: 13.4 % (ref 11.5–14.5)
WBC: 6.3 10*3/uL (ref 3.8–10.6)

## 2016-12-24 LAB — LACTATE DEHYDROGENASE: LDH: 145 U/L (ref 98–192)

## 2016-12-24 NOTE — Progress Notes (Signed)
Pt in for follow up today.  Denies any difficulties or concerns at this time.  Pt reports not taking any medications at this time.

## 2016-12-24 NOTE — Progress Notes (Signed)
[facility] day:  12/24/2016  Chief Complaint: Edgar [name] is a 26 y.o. male with stage IIB testicular cancer who is seen for 2 month assessment.  HPI: The patient was last seen in the medical oncology clinic on 10/14/2016.  At that time, patient had no physical complaints. He felt "back to normal" overall. He continued to smoke daily. Beta hCG <1. AFP 1.4. LDH 152. He had plans to discuss port removal with Dr. Lucky Cowboy.  Patient was back to work on a full time basis. Patient has missed several follow up appointments with this clinic.   During the interim, patient has been seen by his PCP for issues with anxiety. He was experiencing difficulties with sleep. On 11/20/2016 patient was started on Buspar 5-31m three times a day, and Zoloft 50 mg daily.  Symptomatically, patient feeling "pretty good". Patient denies any physical complaints. Patient has not seen Dr. BErlene Quanin follow-up. He states, "I never received a call". Patient denies any B symptoms. He is eating well, and has not gained any weight.   Patient has recently changed jobs. He is working outside at the present. He is changing again on Monday. 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 Patient continues to smoke about a pack of cigarettes per day.    Past Medical History:  Diagnosis Date  . Asthma    AS A CHILD-NO INHALERS  . Cancer ([place]    testicular cancer 11/2016 per pt   . GERD (gastroesophageal reflux disease)    TUMS PRN    Past Surgical History:  Procedure Laterality Date  . ANKLE FRACTURE SURGERY Right 2004  . ORCHIECTOMY Left 11/26/2015   Procedure: ORCHIECTOMY;  Surgeon: AHollice Espy MD;  Location: [facility] ORS;  Service: Urology;  Laterality: Left;  radical/Inguinal approach  . PERIPHERAL VASCULAR CATHETERIZATION N/A 12/16/2015   Procedure: PGlori LuisCath Insertion;  Surgeon: JAlgernon Huxley MD;  Location[facility];  Service: Cardiovascular;  Laterality: N/A;  .  WISDOM TOOTH EXTRACTION  2017    Family History  Problem Relation Age of Onset  . Skin cancer Mother   . Breast cancer Maternal Grandmother   . Colon cancer Maternal Grandfather   . Diabetes Maternal Grandfather   . Emphysema Father   . Mental illness Sister        anxiety  . Stroke Paternal Grandmother   . Prostate cancer Neg Hx   . Kidney cancer Neg Hx   . Bladder Cancer Neg Hx     Social History:  reports that he has been smoking Cigarettes.  He has a 4.00 pack-year smoking history. He has quit using smokeless tobacco. His smokeless tobacco use included Snuff. He reports that he uses drugs, including Marijuana. He reports that he does not drink alcohol.  He smokes 1/2 pack a day.  He smokes marijuana.  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  He has a 543year-old-daughter named [name]. The patient's mother's cell phone number is [phone]  He is working full time. [place]  Allergies: No Known Allergies  Current Medications: Current Outpatient Prescriptions  Medication Sig Dispense Refill  . busPIRone (BUSPAR) 5 MG tablet Take 1-3 tablets (5-15 mg total) by mouth 3 (three) times daily. (Patient not taking: Reported on 12/24/2016) 90 tablet 3  . sertraline (ZOLOFT) 50 MG tablet 1/2 tab a day for 1 week, 1 tab daily after  that (Patient not taking: Reported on 12/24/2016) 30 tablet 2   No current facility-administered medications for this visit.     Review of Systems:  GENERAL:  Feels good.  No fevers or sweats.  Weight up 3 pounds.  PERFORMANCE STATUS (ECOG):  0 HEENT:  No visual changes, runny nose, sore throat, mouth sores or tenderness. Lungs: No shortness of breath or cough.  No hemoptysis. Cardiac:  No chest pain, palpitations, orthopnea, or PND. GI:  No nausea, vomiting, diarrhea, constipation, melena or hematochezia. GU:  No urgency, frequency, dysuria, or hematuria. Musculoskeletal:  No back pain.  No joint  pain.  No muscle tenderness. Extremities:  No pain or swelling. Skin:  No rashes, ulcers, or skin changes. Neuro:  No headache, numbness or weakness, balance or coordination issues. Endocrine:  No diabetes, thyroid issues, hot flashes or night sweats. Psych:  No mood changes, depression or anxiety. Pain:  No pain. Review of systems:  All other systems reviewed and found to be negative.  Physical Exam:  Blood pressure 132/82, pulse 64, temperature 98.5 F (36.9 C), temperature source Tympanic, resp. rate 18, weight 264 lb 9 oz (120 kg). GENERAL:  Well developed, well nourished, gentleman sitting comfortably in the exam room in no acute distress. MENTAL STATUS:  Alert and oriented to person, place and time. HEAD:  Brown curly hair.  Full beard.  Normocephalic, atraumatic, face symmetric, no Cushingoid features. EYES:  Pupils equal round and reactive to light and accomodation.  No conjunctivitis or scleral icterus. ENT:  Oropharynx clear without lesion.  Tongue normal. Mucous membranes moist.  RESPIRATORY:  Clear to auscultation without rales, wheezes or rhonchi. CARDIOVASCULAR:  Regular rate and rhythm without murmur, rub or gallop. ABDOMEN:  Soft, non-tender, with active bowel sounds, and no appreciable hepatosplenomegaly.  No masses. GU:  Right testicle without masses or tenderness.  s/p left orchiectomy with well healed inguinal incision and no mass. SKIN:  No rashes, ulcers or lesions. EXTREMITIES: No edema, no skin discoloration or tenderness.  No palpable cords. LYMPH NODES: No palpable cervical, supraclavicular, axillary or inguinal adenopathy  NEUROLOGICAL: Unremarkable. PSYCH:  Appropriate.    Imaging studies: 11/28/2015:  Chest, abdomen, and pelvic CT revealed no mediastinal adenopathy or suspicious pulmonary nodules.   There were 2 prominent left periaortic retroperitoneal lymph nodes (1.7 cm and 0.9 cm) concerning for testicular nodal metastasis.  There were no abnormal lymph  nodes above the renal veins.  There were postsurgical change in the left hemiscrotum and left inguinal canal.  There was a 3.3 cm soft tissue abnormality anterior to the left iliopsoas muscle which could represent a metastatic lymph node (N2 lesion).  12/17/2015:  Head MRI revealed no evidence of metastatic disease.   03/30/2016:  Chest, abdomen, and pelvic CT revealed interval resolution of left abdominal/pelvic lymphadenopathy.  There was no residual metastatic disease. 09/30/2016:  Chest, abdomen, and pelvic CT revealed no evidence of recurrent or metastatic disease.    Appointment on 12/24/2016  Component Date Value Ref Range Status  . WBC 12/24/2016 6.3  3.8 - 10.6 K/uL Final  . RBC 12/24/2016 4.56  4.40 - 5.90 MIL/uL Final  . Hemoglobin 12/24/2016 13.8  13.0 - 18.0 g/dL Final  . HCT 12/24/2016 42.0  40.0 - 52.0 % Final  . MCV 12/24/2016 92.1  80.0 - 100.0 fL Final  . MCH 12/24/2016 30.2  26.0 - 34.0 pg Final  . MCHC 12/24/2016 32.8  32.0 - 36.0 g/dL Final  . RDW 12/24/2016 13.4  11.5 -  14.5 % Final  . Platelets 12/24/2016 301  150 - 440 K/uL Final  . Neutrophils Relative % 12/24/2016 71  % Final  . Neutro Abs 12/24/2016 4.5  1.4 - 6.5 K/uL Final  . Lymphocytes Relative 12/24/2016 16  % Final  . Lymphs Abs 12/24/2016 1.0  1.0 - 3.6 K/uL Final  . Monocytes Relative 12/24/2016 8  % Final  . Monocytes Absolute 12/24/2016 0.5  0.2 - 1.0 K/uL Final  . Eosinophils Relative 12/24/2016 4  % Final  . Eosinophils Absolute 12/24/2016 0.2  0 - 0.7 K/uL Final  . Basophils Relative 12/24/2016 1  % Final  . Basophils Absolute 12/24/2016 0.1  0 - 0.1 K/uL Final  . Sodium 12/24/2016 138  135 - 145 mmol/L Final  . Potassium 12/24/2016 4.2  3.5 - 5.1 mmol/L Final  . Chloride 12/24/2016 102  101 - 111 mmol/L Final  . CO2 12/24/2016 26  22 - 32 mmol/L Final  . Glucose, Bld 12/24/2016 114* 65 - 99 mg/dL Final  . BUN 12/24/2016 16  6 - 20 mg/dL Final  . Creatinine, Ser 12/24/2016 0.85  0.61 - 1.24  mg/dL Final  . Calcium 12/24/2016 9.2  8.9 - 10.3 mg/dL Final  . Total Protein 12/24/2016 7.2  6.5 - 8.1 g/dL Final  . Albumin 12/24/2016 4.5  3.5 - 5.0 g/dL Final  . AST 12/24/2016 23  15 - 41 U/L Final  . ALT 12/24/2016 22  17 - 63 U/L Final  . Alkaline Phosphatase 12/24/2016 51  38 - 126 U/L Final  . Total Bilirubin 12/24/2016 0.5  0.3 - 1.2 mg/dL Final  . GFR calc non Af Amer 12/24/2016 >60  >60 mL/min Final  . GFR calc Af Amer 12/24/2016 >60  >60 mL/min Final   Comment: (NOTE) The eGFR has been calculated using the CKD EPI equation. This calculation has not been validated in all clinical situations. eGFR's persistently <60 mL/min signify possible Chronic Kidney Disease.   . Anion gap 12/24/2016 10  5 - 15 Final  . LDH 12/24/2016 145  98 - 192 U/L Final    Assessment:  Edgar [name] is a 26 y.o. male with stage IIB left testicular cancer s/p left radical orchiectomy on 11/26/2015.  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  Clinical/pathologic stage is T1 N1-2 S0 M0.  He received 3 cycles of  BEP (12/30/2015 - 02/24/2016).  He received OnPro Neulasta with cycle #2 and #3.  He declined evaluation for retroperitoneal lymph node dissection (RPLND).  He declined sperm banking.  Pre surgery labs on 11/20/2015 revealed an AFP of 411.9, beta-HCG 2,669, and LDH 522 (121-224).    Tumor markers have been followed:   AFP was 411.9 (0-8.3) on 11/20/2015, 11.2 on 12/19/2015, 6.3 on 12/23/2015, 1.3 on 01/27/2016, 1.8 on 02/17/2016, 1.9 on 03/30/2016, 1.4 on 05/27/2016, 1.0 on 07/29/2016, 1.4 on 10/14/2016, and 3.3 on 12/24/2016.   Beta-HCG was 2,669 (0-3) on 11/20/2015, 2 on 12/19/2015, 1 on 12/23/2015, < 1 on 01/27/2016, < 1 on 02/17/2016, < 1 on 03/30/2016, < 1 on 05/27/2016,  < 1 on 07/29/2016, < 1 on 10/14/2016, and 3 on 12/24/2016.   LDH was 522 (98-192) on 11/20/2015, 179  on 12/19/2015, 160 on 12/23/2015, 202 on 01/27/2016, 156 on 02/10/2016, 161 on 02/17/2016, 172 on 03/30/2016, 178 on 05/27/2016, 150 on 07/29/2016, 152 on 10/14/2016, and 145 on 12/24/2016.  Chest, abdomen, and pelvic CT scan on 09/30/2016 revealed no evidence of recurrent or metastatic disease.   Pulmonary function testing revealed a FVC 4.71 liters (76% predicted), FEV1 4.18 liters (88% predicted), TLC 7.23 liters (87% predicted), VC 4.88 (79% predicted) and DLCO of 39.3 mL/mmHg/min (100% predicted).   He smokes 1/2 pack per day.  Symptomatically, he denies any complaint.  Exam is normal.  He continues to smoke.  Plan: 1.  Labs today: CBC with differential, CMP, AFP, LDH, beta hCG. 2.  Discuss port removal with Dr. Lucky Cowboy. Patient electing to keep port in place until after his next scans in January 2019. Will discuss at next scheduled visit.  3.  Discuss continued surveillance in year 1 (H&P every 2 months, abdomen/pelvic CT scan every 6 months , CXR every 6 months), year 2 (H&P every 3 months, abdomen/pelvic CT scan every 6-12 months , CXR every 6 months), year 3 (H&P every 6 months, abdomen/pelvic CT scan every year , CXR every year), year 4 (H&P every 6 months, CXR every year), and year 5 (H&P every 6 months).  4.  Schedule next CT of the abdomen and pelvis on 04/02/2017. 5.  Schedule CXR on 04/02/2017.  6.  Discuss smoking cessation. 7.  RTC in 3 months (after CT and CXR) for MD assessment, labs (CBC with diff, CMP, AFP, LDH, beta-HCG), and review CT scans and CXR.    Honor Loh, NP  12/24/2016, 4:14 PM   I saw and evaluated the patient, participating in the key portions of the service and reviewing pertinent diagnostic studies and records.  I reviewed the nurse practitioner's note and agree with the findings and the plan.  The assessment and plan were discussed with the patient.  A few questions were asked by the patient and answered.   Nolon Stalls, MD 12/24/2016,4:14 PM

## 2016-12-25 ENCOUNTER — Other Ambulatory Visit: Payer: Self-pay | Admitting: *Deleted

## 2016-12-25 ENCOUNTER — Other Ambulatory Visit: Payer: Self-pay | Admitting: Urgent Care

## 2016-12-25 DIAGNOSIS — C6212 Malignant neoplasm of descended left testis: Secondary | ICD-10-CM

## 2016-12-25 LAB — BETA HCG QUANT (REF LAB): hCG Quant: 3 m[IU]/mL (ref 0–3)

## 2016-12-25 LAB — AFP TUMOR MARKER: AFP, Serum, Tumor Marker: 3.3 ng/mL (ref 0.0–8.3)

## 2016-12-28 ENCOUNTER — Inpatient Hospital Stay: Payer: BLUE CROSS/BLUE SHIELD

## 2016-12-28 DIAGNOSIS — C6212 Malignant neoplasm of descended left testis: Secondary | ICD-10-CM | POA: Diagnosis not present

## 2016-12-28 DIAGNOSIS — Z95828 Presence of other vascular implants and grafts: Secondary | ICD-10-CM

## 2016-12-28 MED ORDER — SODIUM CHLORIDE 0.9% FLUSH
10.0000 mL | INTRAVENOUS | Status: DC | PRN
  Administered 2016-12-28: 10 mL via INTRAVENOUS
  Filled 2016-12-28: qty 10

## 2016-12-28 MED ORDER — HEPARIN SOD (PORK) LOCK FLUSH 100 UNIT/ML IV SOLN
500.0000 [IU] | Freq: Once | INTRAVENOUS | Status: AC
  Administered 2016-12-28: 500 [IU] via INTRAVENOUS

## 2016-12-31 ENCOUNTER — Ambulatory Visit: Payer: BLUE CROSS/BLUE SHIELD | Admitting: Hematology and Oncology

## 2017-01-03 ENCOUNTER — Other Ambulatory Visit: Payer: Self-pay | Admitting: Family Medicine

## 2017-01-18 ENCOUNTER — Other Ambulatory Visit: Payer: Self-pay | Admitting: *Deleted

## 2017-01-18 ENCOUNTER — Telehealth: Payer: Self-pay | Admitting: *Deleted

## 2017-01-18 DIAGNOSIS — C6292 Malignant neoplasm of left testis, unspecified whether descended or undescended: Secondary | ICD-10-CM

## 2017-01-18 NOTE — Telephone Encounter (Signed)
Called patient and LVM that he will need to have his lab appointment moved from 02-11-17 to 01-27-17.  I will have scheduling in [place] reschedule him and give him a call as to the time.

## 2017-01-18 NOTE — Telephone Encounter (Signed)
Erroneous entry

## 2017-01-18 NOTE — Telephone Encounter (Signed)
-----   Message from [name], MD sent at 01/17/2017 12:36 PM EST ----- Regarding: Follow-up labs  He was last seen on 10/11.  Labs were still normal, but higher than in the past.  I thought he was going to get follow-up labs in 1 month secondary to the increase.  Labs:  CBC, CMP, LDH, beta HCG, AFP.  M

## 2017-01-27 ENCOUNTER — Inpatient Hospital Stay: Payer: Self-pay

## 2017-02-11 ENCOUNTER — Inpatient Hospital Stay: Payer: Self-pay

## 2017-03-13 IMAGING — CT CT CHEST W/ CM
2 of 4 series · 15 of 36 positions shown, 18 images · IV contrast (iopamidol)
Comparison: 11/28/2015

CLINICAL DATA: Followup metastatic left testicular carcinoma.
Recently completed chemotherapy. Restaging.

EXAM:
CT CHEST, ABDOMEN, AND PELVIS WITH CONTRAST
TECHNIQUE: Multidetector CT imaging of the chest, abdomen and pelvis was
performed following the standard protocol during bolus
administration of intravenous contrast.
CONTRAST:  125mL [id number] IOPAMIDOL ([id number]) INJECTION 61%

[Series 5: lung · axial · 0.75mm/px · z∈[-483,-241]mm · 12 of 143 slices shown, 15 images]
[im 11/143  mediastinal]
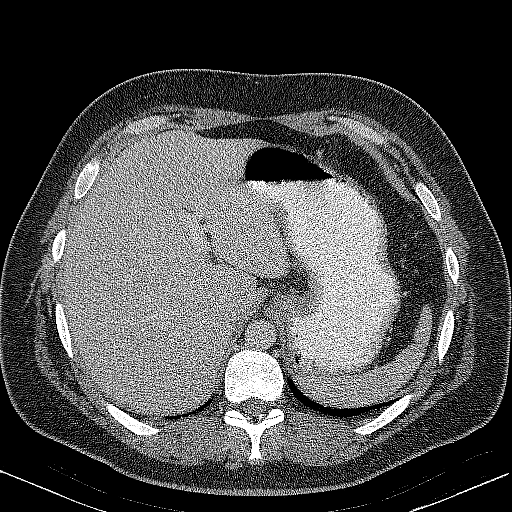
[im 11/143  lung]
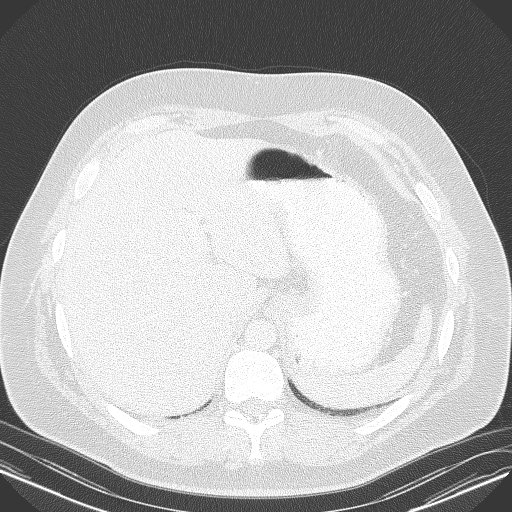
[im 22/143  lung]
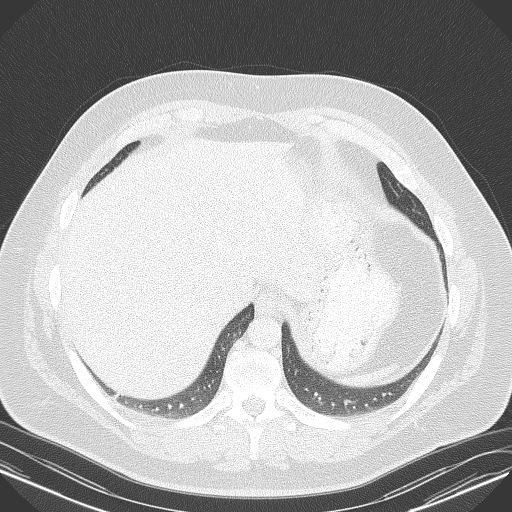
[im 33/143  lung]
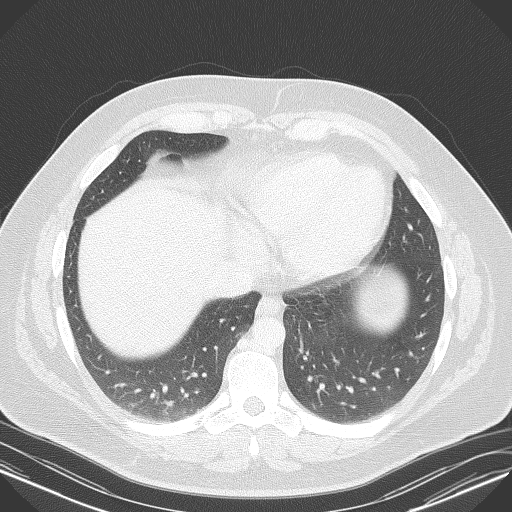
[im 44/143  lung]
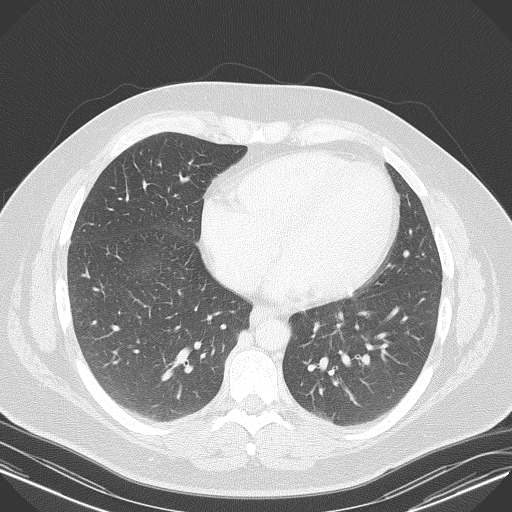
[im 55/143  mediastinal]
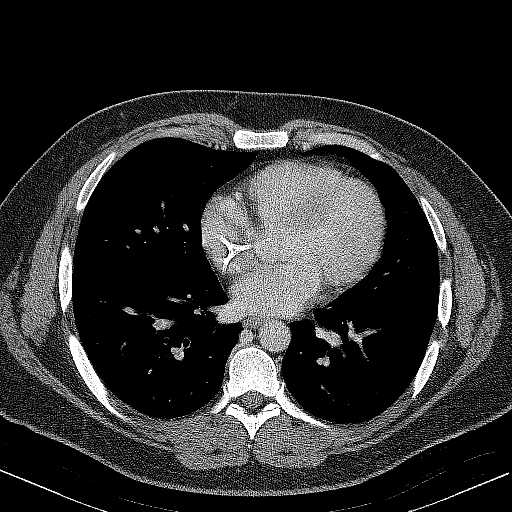
[im 55/143  lung]
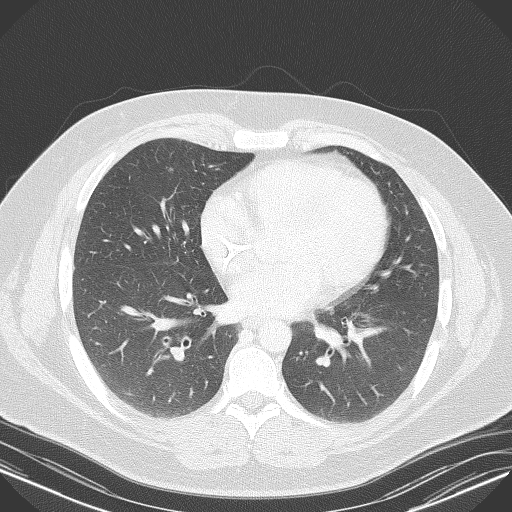
[im 66/143  lung]
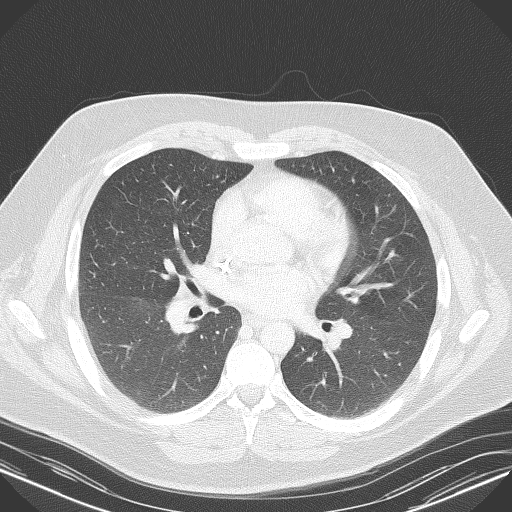
[im 77/143  lung]
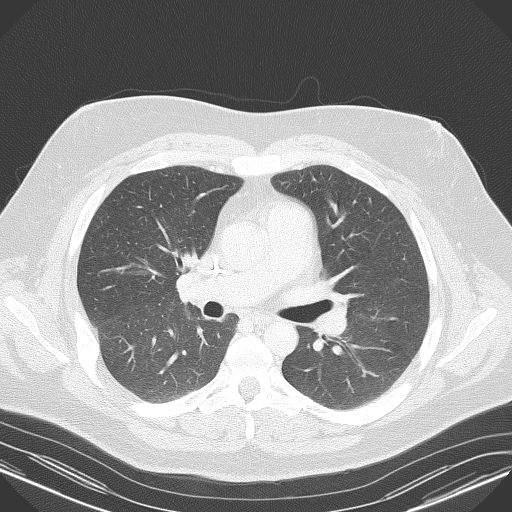
[im 88/143  lung]
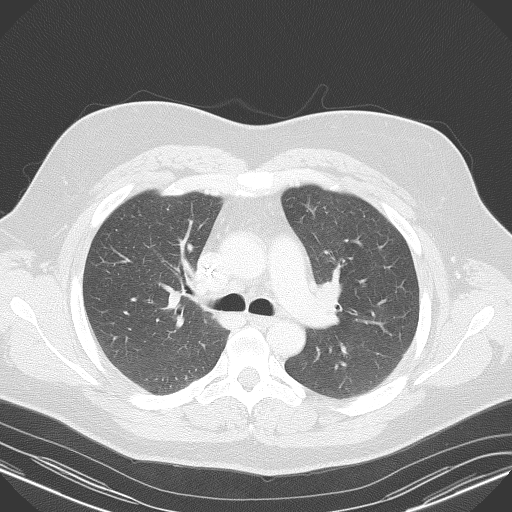
[im 99/143  mediastinal]
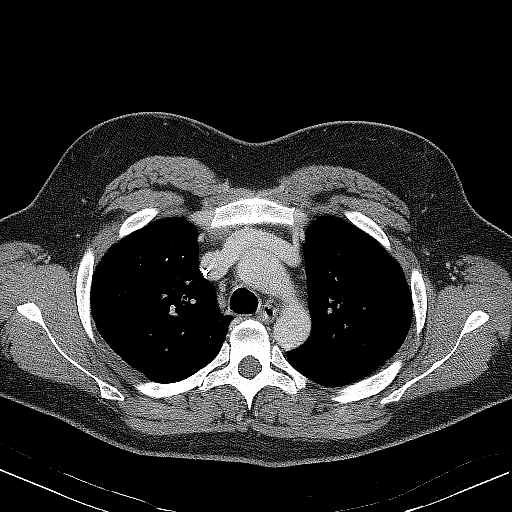
[im 99/143  lung]
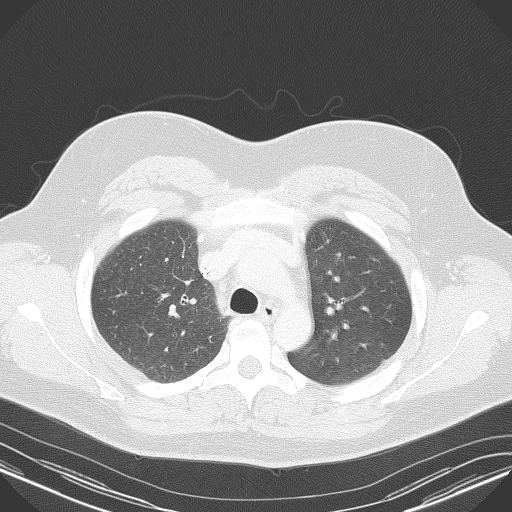
[im 110/143  lung]
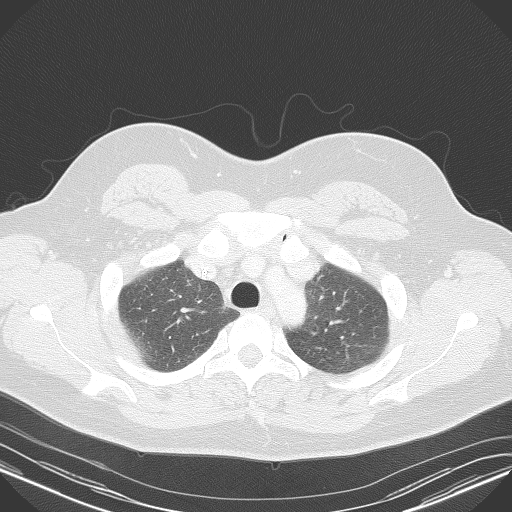
[im 121/143  lung]
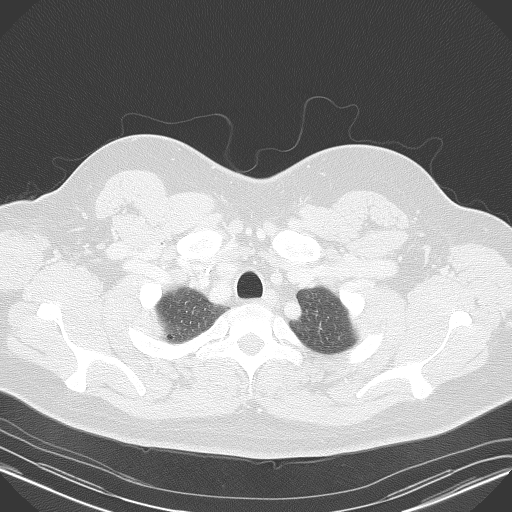
[im 132/143  lung]
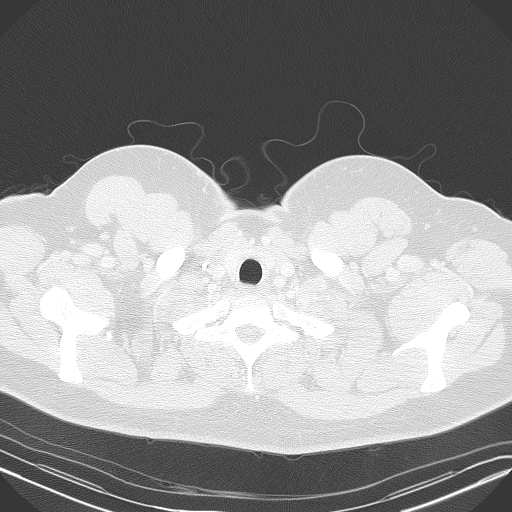

[Series 602: coronal · coronal · 1.44mm/px · 3 of 120 slices shown]
[im 24/120  lung]
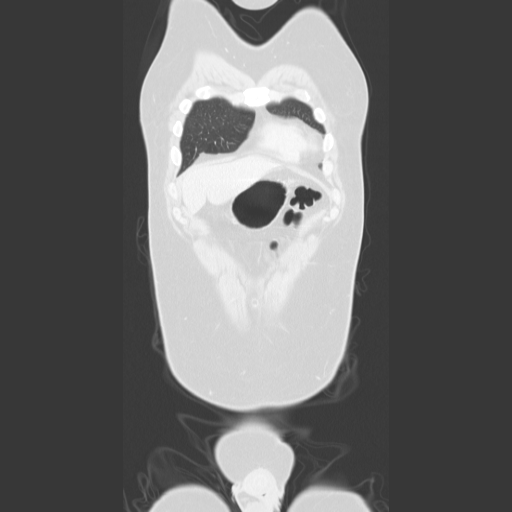
[im 48/120  lung]
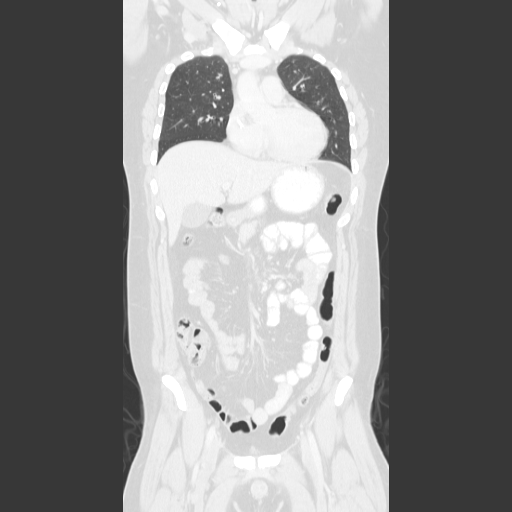
[im 72/120  lung]
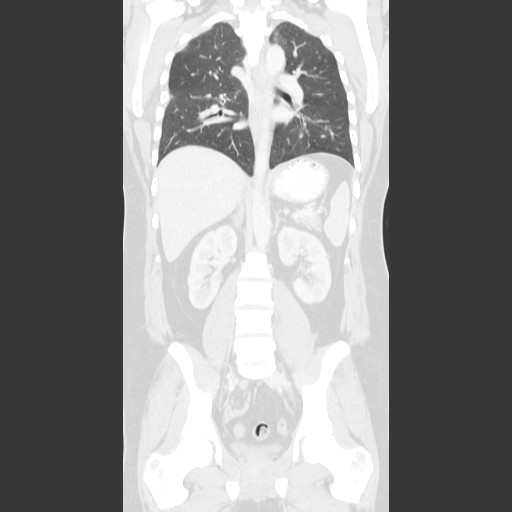

[15 of 36 positions shown; findings below may reference images not displayed]

FINDINGS: CT CHEST FINDINGS

Cardiovascular: No acute findings.

Mediastinum/Lymph Nodes: No masses or pathologically enlarged lymph
nodes identified.

Lungs/Pleura: No pulmonary infiltrate or mass identified. No
effusion present.

Musculoskeletal:  No suspicious bone lesions identified.

CT ABDOMEN AND PELVIS FINDINGS

Hepatobiliary: No masses identified.

Pancreas:  No mass or inflammatory changes.

Spleen:  Within normal limits in size and appearance.

Adrenals/Urinary tract:  No masses or hydronephrosis.

Stomach/Bowel: No evidence of obstruction, inflammatory process, or
abnormal fluid collections.

Vascular/Lymphatic: No pathologically enlarged lymph nodes
identified. Previously seen mild retroperitoneal lymphadenopathy in
left paraaortic region has resolved since previous study. There has
also been resolution of a ill-defined masslike opacity in the left
lower quadrant along the anterior aspect of the psoas muscle since
previous study which may represent resolving adenopathy for postop
hematoma from left orchiectomy. No abdominal aortic aneurysm.

Reproductive: No mass or other significant abnormality identified.
Postop changes from left orchiectomy noted.

Other:  None.

Musculoskeletal:  No suspicious bone lesions identified.
IMPRESSION: Interval resolution of left abdominal/pelvic lymphadenopathy. No
residual metastatic disease or other acute findings identified.

## 2017-04-02 ENCOUNTER — Ambulatory Visit: Payer: BLUE CROSS/BLUE SHIELD

## 2017-04-07 ENCOUNTER — Inpatient Hospital Stay: Payer: Self-pay | Admitting: Hematology and Oncology

## 2017-04-07 ENCOUNTER — Inpatient Hospital Stay: Payer: Self-pay | Attending: Hematology and Oncology

## 2017-04-07 NOTE — Progress Notes (Deleted)
[facility] day:  04/07/2017  Chief Complaint: Edgar [name] is a 27 y.o. male with stage IIB testicular cancer who is seen for 2 month assessment.  HPI: The patient was last seen in the medical oncology clinic on 12/24/2016.  At that time, he denied any complaint.  Exam was normal.  He continued to smoke.  Beta hCG was 3. AFP 3.3. LDH 145.   He was contacted regarding his tumor markers.  Recheck was requested in 1 month.  He did not return for follow-up.  During the interim,   Past Medical History:  Diagnosis Date  . Asthma    AS A CHILD-NO INHALERS  . Cancer ([place])    testicular cancer 11/2016 per pt   . GERD (gastroesophageal reflux disease)    TUMS PRN    Past Surgical History:  Procedure Laterality Date  . ANKLE FRACTURE SURGERY Right 2004  . ORCHIECTOMY Left 11/26/2015   Procedure: ORCHIECTOMY;  Surgeon: [name], MD;  Location: [facility] OR[facility];  Service: Urology;  Laterality: Left;  radical/Inguinal approach  . PERIPHERAL VASCULAR CATHETERIZATION N/A 12/16/2015   Procedure: [name] Cath Insertion;  Surgeon: [name], MD;  Location: [facility];  Service: Cardiovascular;  Laterality: N/A;  . WISDOM TOOTH EXTRACTION  2017    Family History  Problem Relation Age of Onset  . Skin cancer Mother   . Breast cancer Maternal Grandmother   . Colon cancer Maternal Grandfather   . Diabetes Maternal Grandfather   . Emphysema Father   . Mental illness Sister        anxiety  . Stroke Paternal Grandmother   . Prostate cancer Neg Hx   . Kidney cancer Neg Hx   . Bladder Cancer Neg Hx     Social History:  reports that he has been smoking cigarettes.  He has a 4.00 pack-year smoking history. He has quit using smokeless tobacco. His smokeless tobacco use included snuff. He reports that he uses drugs. Drug: Marijuana. He reports that he does not drink alcohol.  He smokes 1/2 pack a day.  He smokes marijuana.  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  Work requires regular hearing tests.  He has a 59 year-old-daughter named [name]. The patient's mother's cell phone number is [phone].  He is working full time. The patient lives in [place].  Allergies: No Known Allergies  Current Medications: Current Outpatient Medications  Medication Sig Dispense Refill  . busPIRone (BUSPAR) 10 MG tablet TAKE 1/2 - 1 AND 1/2 (5-15MG) BY MOUTH 3 TIMES A DAY 30 tablet 0  . sertraline (ZOLOFT) 50 MG tablet 1/2 tab a day for 1 week, 1 tab daily after that (Patient not taking: Reported on 12/24/2016) 30 tablet 2   No current facility-administered medications for this visit.     Review of Systems:  GENERAL:  Feels good.  No fevers or sweats.  Weight up 3 pounds.  PERFORMANCE STATUS (ECOG):  0 HEENT:  No visual changes, runny nose, sore throat, mouth sores or tenderness. Lungs: No shortness of breath or cough.  No hemoptysis. Cardiac:  No chest pain, palpitations, orthopnea, or PND. GI:  No nausea, vomiting, diarrhea, constipation, melena or hematochezia. GU:  No urgency, frequency, dysuria, or hematuria. Musculoskeletal:  No back pain.  No joint pain.  No muscle tenderness. Extremities:  No pain or swelling. Skin:  No rashes, ulcers, or skin changes. Neuro:  No headache, numbness or weakness,  balance or coordination issues. Endocrine:  No diabetes, thyroid issues, hot flashes or night sweats. Psych:  No mood changes, depression or anxiety. Pain:  No pain. Review of systems:  All other systems reviewed and found to be negative.  Physical Exam:  There were no vitals taken for this visit. GENERAL:  Well developed, well nourished, gentleman sitting comfortably in the exam room in no acute distress. MENTAL STATUS:  Alert and oriented to person, place and time. HEAD:  Brown curly hair.  Full beard.  Normocephalic, atraumatic, face symmetric, no Cushingoid features. EYES:  Pupils equal round and reactive to light and accomodation.  No  conjunctivitis or scleral icterus. ENT:  Oropharynx clear without lesion.  Tongue normal. Mucous membranes moist.  RESPIRATORY:  Clear to auscultation without rales, wheezes or rhonchi. CARDIOVASCULAR:  Regular rate and rhythm without murmur, rub or gallop. ABDOMEN:  Soft, non-tender, with active bowel sounds, and no appreciable hepatosplenomegaly.  No masses. GU:  Right testicle without masses or tenderness.  s/p left orchiectomy with well healed inguinal incision and no mass. SKIN:  No rashes, ulcers or lesions. EXTREMITIES: No edema, no skin discoloration or tenderness.  No palpable cords. LYMPH NODES: No palpable cervical, supraclavicular, axillary or inguinal adenopathy  NEUROLOGICAL: Unremarkable. PSYCH:  Appropriate.    Imaging studies: 11/28/2015:  Chest, abdomen, and pelvic CT revealed no mediastinal adenopathy or suspicious pulmonary nodules.   There were 2 prominent left periaortic retroperitoneal lymph nodes (1.7 cm and 0.9 cm) concerning for testicular nodal metastasis.  There were no abnormal lymph nodes above the renal veins.  There were postsurgical change in the left hemiscrotum and left inguinal canal.  There was a 3.3 cm soft tissue abnormality anterior to the left iliopsoas muscle which could represent a metastatic lymph node (N2 lesion).  12/17/2015:  Head MRI revealed no evidence of metastatic disease.   03/30/2016:  Chest, abdomen, and pelvic CT revealed interval resolution of left abdominal/pelvic lymphadenopathy.  There was no residual metastatic disease. 09/30/2016:  Chest, abdomen, and pelvic CT revealed no evidence of recurrent or metastatic disease.    No visits with results within 3 Day(s) from this visit.  Latest known visit with results is:  Appointment on 12/24/2016  Component Date Value Ref Range Status  . WBC 12/24/2016 6.3  3.8 - 10.6 K/uL Final  . RBC 12/24/2016 4.56  4.40 - 5.90 MIL/uL Final  . Hemoglobin 12/24/2016 13.8  13.0 - 18.0 g/dL Final  . HCT  12/24/2016 42.0  40.0 - 52.0 % Final  . MCV 12/24/2016 92.1  80.0 - 100.0 fL Final  . MCH 12/24/2016 30.2  26.0 - 34.0 pg Final  . MCHC 12/24/2016 32.8  32.0 - 36.0 g/dL Final  . RDW 12/24/2016 13.4  11.5 - 14.5 % Final  . Platelets 12/24/2016 301  150 - 440 K/uL Final  . Neutrophils Relative % 12/24/2016 71  % Final  . Neutro Abs 12/24/2016 4.5  1.4 - 6.5 K/uL Final  . Lymphocytes Relative 12/24/2016 16  % Final  . Lymphs Abs 12/24/2016 1.0  1.0 - 3.6 K/uL Final  . Monocytes Relative 12/24/2016 8  % Final  . Monocytes Absolute 12/24/2016 0.5  0.2 - 1.0 K/uL Final  . Eosinophils Relative 12/24/2016 4  % Final  . Eosinophils Absolute 12/24/2016 0.2  0 - 0.7 K/uL Final  . Basophils Relative 12/24/2016 1  % Final  . Basophils Absolute 12/24/2016 0.1  0 - 0.1 K/uL Final  . Sodium 12/24/2016 138  135 -  145 mmol/L Final  . Potassium 12/24/2016 4.2  3.5 - 5.1 mmol/L Final  . Chloride 12/24/2016 102  101 - 111 mmol/L Final  . CO2 12/24/2016 26  22 - 32 mmol/L Final  . Glucose, Bld 12/24/2016 114* 65 - 99 mg/dL Final  . BUN 12/24/2016 16  6 - 20 mg/dL Final  . Creatinine, Ser 12/24/2016 0.85  0.61 - 1.24 mg/dL Final  . Calcium 12/24/2016 9.2  8.9 - 10.3 mg/dL Final  . Total Protein 12/24/2016 7.2  6.5 - 8.1 g/dL Final  . Albumin 12/24/2016 4.5  3.5 - 5.0 g/dL Final  . AST 12/24/2016 23  15 - 41 U/L Final  . ALT 12/24/2016 22  17 - 63 U/L Final  . Alkaline Phosphatase 12/24/2016 51  38 - 126 U/L Final  . Total Bilirubin 12/24/2016 0.5  0.3 - 1.2 mg/dL Final  . GFR calc non Af Amer 12/24/2016 >60  >60 mL/min Final  . GFR calc Af Amer 12/24/2016 >60  >60 mL/min Final   Comment: (NOTE) The eGFR has been calculated using the CKD EPI equation. This calculation has not been validated in all clinical situations. eGFR's persistently <60 mL/min signify possible Chronic Kidney Disease.   . Anion gap 12/24/2016 10  5 - 15 Final  . AFP, Serum, Tumor Marker 12/24/2016 3.3  0.0 - 8.3 ng/mL Final    Comment: (NOTE) Roche ECLIA methodology Performed At: [facility] [place], [place] [id number] Lindon Romp MD XB:[id number]   . LDH 12/24/2016 145  98 - 192 U/L Final  . hCG Quant 12/24/2016 3  0 - 3 mIU/mL Final   Comment: (NOTE) Roche ECLIA methodology Performed At: [facility] [address] [place], [place] [id number] Lindon Romp MD QI:[id number]     Assessment:  Edgar [name] is a 27 y.o. male with stage IIB left testicular cancer s/p left radical orchiectomy on 11/26/2015.  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  Clinical/pathologic stage is T1 N1-2 S0 M0.  He received 3 cycles of  BEP (12/30/2015 - 02/24/2016).  He received OnPro Neulasta with cycle #2 and #3.  He declined evaluation for retroperitoneal lymph node dissection (RPLND).  He declined sperm banking.  Pre surgery labs on 11/20/2015 revealed an AFP of 411.9, beta-HCG 2,669, and LDH 522 (121-224).    Tumor markers have been followed:   AFP was 411.9 (0-8.3) on 11/20/2015, 11.2 on 12/19/2015, 6.3 on 12/23/2015, 1.3 on 01/27/2016, 1.8 on 02/17/2016, 1.9 on 03/30/2016, 1.4 on 05/27/2016, 1.0 on 07/29/2016, 1.4 on 10/14/2016, and 3.3 on 12/24/2016.   Beta-HCG was 2,669 (0-3) on 11/20/2015, 2 on 12/19/2015, 1 on 12/23/2015, < 1 on 01/27/2016, < 1 on 02/17/2016, < 1 on 03/30/2016, < 1 on 05/27/2016,  < 1 on 07/29/2016, < 1 on 10/14/2016, and 3 on 12/24/2016.   LDH was 522 (98-192) on 11/20/2015, 179 on 12/19/2015, 160 on 12/23/2015, 202 on 01/27/2016, 156 on 02/10/2016, 161 on 02/17/2016, 172 on 03/30/2016, 178 on 05/27/2016, 150 on 07/29/2016, 152 on 10/14/2016, and 145 on 12/24/2016.  Chest, abdomen, and pelvic CT scan on 09/30/2016 revealed no evidence of recurrent or metastatic disease.   Pulmonary function testing revealed a FVC 4.71 liters (76% predicted),  FEV1 4.18 liters (88% predicted), TLC 7.23 liters (87% predicted), VC 4.88 (79% predicted) and DLCO of 39.3 mL/mmHg/min (100% predicted).   He smokes 1/2  pack per day.  Symptomatically, he denies any complaint.  Exam is normal.  He continues to smoke.  Plan: 1.  Labs today: CBC with differential, CMP, AFP, LDH, beta hCG.    2.  Discuss port removal with Dr. Lucky Cowboy. Patient electing to keep port in place until after his next scans in January 2019. Will discuss at next scheduled visit.  3.  Discuss continued surveillance in year 1 (H&P every 2 months, abdomen/pelvic CT scan every 6 months , CXR every 6 months), year 2 (H&P every 3 months, abdomen/pelvic CT scan every 6-12 months , CXR every 6 months), year 3 (H&P every 6 months, abdomen/pelvic CT scan every year , CXR every year), year 4 (H&P every 6 months, CXR every year), and year 5 (H&P every 6 months).  4.  Schedule next CT of the abdomen and pelvis on 04/02/2017. 5.  Schedule CXR on 04/02/2017.  6.  Discuss smoking cessation. 7.  RTC in 3 months (after CT and CXR) for MD assessment, labs (CBC with diff, CMP, AFP, LDH, beta-HCG), and review CT scans and CXR.    [name], MD  04/07/2017, 6:02 AM   I saw and evaluated the patient, participating in the key portions of the service and reviewing pertinent diagnostic studies and records.  I reviewed the nurse practitioner's note and agree with the findings and the plan.  The assessment and plan were discussed with the patient.  A few questions were asked by the patient and answered.   Nolon Stalls, MD 04/07/2017,6:02 AM

## 2017-04-08 ENCOUNTER — Other Ambulatory Visit: Payer: BLUE CROSS/BLUE SHIELD

## 2017-04-08 ENCOUNTER — Ambulatory Visit: Payer: BLUE CROSS/BLUE SHIELD | Admitting: Hematology and Oncology

## 2017-05-13 ENCOUNTER — Inpatient Hospital Stay: Payer: Self-pay

## 2017-10-06 ENCOUNTER — Encounter: Payer: Self-pay | Admitting: Emergency Medicine

## 2017-10-06 ENCOUNTER — Emergency Department: Payer: BLUE CROSS/BLUE SHIELD

## 2017-10-06 ENCOUNTER — Emergency Department
Admission: EM | Admit: 2017-10-06 | Discharge: 2017-10-07 | Disposition: A | Discharge status: Home / Self Care | Payer: BLUE CROSS/BLUE SHIELD | Source: Home / Self Care | Attending: Emergency Medicine | Admitting: Emergency Medicine

## 2017-10-06 ENCOUNTER — Other Ambulatory Visit: Payer: Self-pay

## 2017-10-06 DIAGNOSIS — C772 Secondary and unspecified malignant neoplasm of intra-abdominal lymph nodes: Secondary | ICD-10-CM | POA: Diagnosis not present

## 2017-10-06 DIAGNOSIS — F17210 Nicotine dependence, cigarettes, uncomplicated: Secondary | ICD-10-CM

## 2017-10-06 DIAGNOSIS — Z7901 Long term (current) use of anticoagulants: Secondary | ICD-10-CM | POA: Diagnosis not present

## 2017-10-06 DIAGNOSIS — Z8547 Personal history of malignant neoplasm of testis: Secondary | ICD-10-CM | POA: Insufficient documentation

## 2017-10-06 DIAGNOSIS — A419 Sepsis, unspecified organism: Secondary | ICD-10-CM | POA: Diagnosis not present

## 2017-10-06 DIAGNOSIS — M79605 Pain in left leg: Secondary | ICD-10-CM | POA: Diagnosis not present

## 2017-10-06 DIAGNOSIS — N2889 Other specified disorders of kidney and ureter: Secondary | ICD-10-CM | POA: Diagnosis not present

## 2017-10-06 DIAGNOSIS — C6290 Malignant neoplasm of unspecified testis, unspecified whether descended or undescended: Secondary | ICD-10-CM | POA: Diagnosis not present

## 2017-10-06 DIAGNOSIS — Z9221 Personal history of antineoplastic chemotherapy: Secondary | ICD-10-CM | POA: Insufficient documentation

## 2017-10-06 DIAGNOSIS — I82422 Acute embolism and thrombosis of left iliac vein: Secondary | ICD-10-CM | POA: Diagnosis not present

## 2017-10-06 DIAGNOSIS — Z800 Family history of malignant neoplasm of digestive organs: Secondary | ICD-10-CM | POA: Diagnosis not present

## 2017-10-06 DIAGNOSIS — L03116 Cellulitis of left lower limb: Secondary | ICD-10-CM

## 2017-10-06 DIAGNOSIS — K219 Gastro-esophageal reflux disease without esophagitis: Secondary | ICD-10-CM | POA: Diagnosis not present

## 2017-10-06 DIAGNOSIS — C6292 Malignant neoplasm of left testis, unspecified whether descended or undescended: Secondary | ICD-10-CM | POA: Diagnosis not present

## 2017-10-06 DIAGNOSIS — L98491 Non-pressure chronic ulcer of skin of other sites limited to breakdown of skin: Secondary | ICD-10-CM | POA: Diagnosis not present

## 2017-10-06 DIAGNOSIS — Z79891 Long term (current) use of opiate analgesic: Secondary | ICD-10-CM | POA: Diagnosis not present

## 2017-10-06 DIAGNOSIS — D649 Anemia, unspecified: Secondary | ICD-10-CM | POA: Diagnosis not present

## 2017-10-06 DIAGNOSIS — I82402 Acute embolism and thrombosis of unspecified deep veins of left lower extremity: Secondary | ICD-10-CM | POA: Diagnosis not present

## 2017-10-06 DIAGNOSIS — M545 Low back pain: Secondary | ICD-10-CM | POA: Diagnosis not present

## 2017-10-06 DIAGNOSIS — I82412 Acute embolism and thrombosis of left femoral vein: Secondary | ICD-10-CM | POA: Diagnosis not present

## 2017-10-06 DIAGNOSIS — C8335 Diffuse large B-cell lymphoma, lymph nodes of inguinal region and lower limb: Secondary | ICD-10-CM | POA: Diagnosis not present

## 2017-10-06 DIAGNOSIS — Z803 Family history of malignant neoplasm of breast: Secondary | ICD-10-CM | POA: Diagnosis not present

## 2017-10-06 DIAGNOSIS — I82432 Acute embolism and thrombosis of left popliteal vein: Secondary | ICD-10-CM | POA: Diagnosis not present

## 2017-10-06 DIAGNOSIS — C775 Secondary and unspecified malignant neoplasm of intrapelvic lymph nodes: Secondary | ICD-10-CM | POA: Diagnosis not present

## 2017-10-06 DIAGNOSIS — Z7952 Long term (current) use of systemic steroids: Secondary | ICD-10-CM | POA: Diagnosis not present

## 2017-10-06 DIAGNOSIS — D696 Thrombocytopenia, unspecified: Secondary | ICD-10-CM | POA: Diagnosis not present

## 2017-10-06 DIAGNOSIS — J45909 Unspecified asthma, uncomplicated: Secondary | ICD-10-CM

## 2017-10-06 DIAGNOSIS — R0602 Shortness of breath: Secondary | ICD-10-CM | POA: Diagnosis not present

## 2017-10-06 DIAGNOSIS — Z808 Family history of malignant neoplasm of other organs or systems: Secondary | ICD-10-CM | POA: Diagnosis not present

## 2017-10-06 DIAGNOSIS — R0902 Hypoxemia: Secondary | ICD-10-CM | POA: Diagnosis not present

## 2017-10-06 DIAGNOSIS — Z79899 Other long term (current) drug therapy: Secondary | ICD-10-CM | POA: Diagnosis not present

## 2017-10-06 DIAGNOSIS — I871 Compression of vein: Secondary | ICD-10-CM | POA: Diagnosis not present

## 2017-10-06 DIAGNOSIS — M7989 Other specified soft tissue disorders: Secondary | ICD-10-CM | POA: Diagnosis present

## 2017-10-06 DIAGNOSIS — R197 Diarrhea, unspecified: Secondary | ICD-10-CM | POA: Diagnosis not present

## 2017-10-06 DIAGNOSIS — G893 Neoplasm related pain (acute) (chronic): Secondary | ICD-10-CM | POA: Diagnosis not present

## 2017-10-06 DIAGNOSIS — Z5320 Procedure and treatment not carried out because of patient's decision for unspecified reasons: Secondary | ICD-10-CM | POA: Diagnosis not present

## 2017-10-06 DIAGNOSIS — C6212 Malignant neoplasm of descended left testis: Secondary | ICD-10-CM | POA: Diagnosis present

## 2017-10-06 DIAGNOSIS — R51 Headache: Secondary | ICD-10-CM | POA: Diagnosis not present

## 2017-10-06 DIAGNOSIS — D709 Neutropenia, unspecified: Secondary | ICD-10-CM | POA: Diagnosis not present

## 2017-10-06 LAB — COMPREHENSIVE METABOLIC PANEL
ALBUMIN: 3.2 g/dL — AB (ref 3.5–5.0)
ALT: 13 U/L (ref 0–44)
ANION GAP: 9 (ref 5–15)
AST: 12 U/L — ABNORMAL LOW (ref 15–41)
Alkaline Phosphatase: 55 U/L (ref 38–126)
BUN: 9 mg/dL (ref 6–20)
CHLORIDE: 97 mmol/L — AB (ref 98–111)
CO2: 29 mmol/L (ref 22–32)
Calcium: 8.7 mg/dL — ABNORMAL LOW (ref 8.9–10.3)
Creatinine, Ser: 0.73 mg/dL (ref 0.61–1.24)
GFR calc non Af Amer: >60 mL/min (ref >60)
GLUCOSE: 110 mg/dL — AB (ref 70–99)
Potassium: 3.3 mmol/L — ABNORMAL LOW (ref 3.5–5.1)
SODIUM: 135 mmol/L (ref 135–145)
Total Bilirubin: 0.7 mg/dL (ref 0.3–1.2)
Total Protein: 6.2 g/dL — ABNORMAL LOW (ref 6.5–8.1)

## 2017-10-06 LAB — CBC WITH DIFFERENTIAL/PLATELET
BASOS PCT: 0 %
Basophils Absolute: 0 10*3/uL (ref 0–0.1)
EOS ABS: 0 10*3/uL (ref 0–0.7)
EOS PCT: 0 %
HCT: 26.1 % — ABNORMAL LOW (ref 40.0–52.0)
Hemoglobin: 8.9 g/dL — ABNORMAL LOW (ref 13.0–18.0)
LYMPHS ABS: 0.8 10*3/uL — AB (ref 1.0–3.6)
Lymphocytes Relative: 9 %
MCH: 30.2 pg (ref 26.0–34.0)
MCHC: 34 g/dL (ref 32.0–36.0)
MCV: 88.8 fL (ref 80.0–100.0)
Monocytes Absolute: 1.1 10*3/uL — ABNORMAL HIGH (ref 0.2–1.0)
Monocytes Relative: 11 %
Neutro Abs: 7.4 10*3/uL — ABNORMAL HIGH (ref 1.4–6.5)
Neutrophils Relative %: 80 %
PLATELETS: 81 10*3/uL — AB (ref 150–440)
RBC: 2.93 MIL/uL — ABNORMAL LOW (ref 4.40–5.90)
RDW: 12.5 % (ref 11.5–14.5)
WBC: 9.2 10*3/uL (ref 3.8–10.6)

## 2017-10-06 LAB — LACTIC ACID, PLASMA: Lactic Acid, Venous: 0.9 mmol/L (ref 0.5–1.9)

## 2017-10-06 LAB — PROTIME-INR
INR: 2.26
PROTHROMBIN TIME: 24.8 s — AB (ref 11.4–15.2)

## 2017-10-06 LAB — SEDIMENTATION RATE: SED RATE: 73 mm/h — AB (ref 0–15)

## 2017-10-06 MED ORDER — CLINDAMYCIN HCL 300 MG PO CAPS: 300.0000 mg | ORAL_CAPSULE | Freq: Three times a day (TID) | ORAL | 0 refills | Status: DC

## 2017-10-06 MED ORDER — KETOROLAC TROMETHAMINE 30 MG/ML IJ SOLN
30.0000 mg | Freq: Once | INTRAMUSCULAR | Status: AC
  Administered 2017-10-06: 30 mg via INTRAVENOUS
  Filled 2017-10-06: qty 1

## 2017-10-06 MED ORDER — CLINDAMYCIN PHOSPHATE 600 MG/50ML IV SOLN
600.0000 mg | Freq: Once | INTRAVENOUS | Status: AC
  Administered 2017-10-07: 600 mg via INTRAVENOUS
  Filled 2017-10-06: qty 50

## 2017-10-06 MED ORDER — SODIUM CHLORIDE 0.9 % IV BOLUS
1000.0000 mL | Freq: Once | INTRAVENOUS | Status: AC
  Administered 2017-10-06: 1000 mL via INTRAVENOUS

## 2017-10-06 MED ORDER — ENOXAPARIN SODIUM 120 MG/0.8ML ~~LOC~~ SOLN
1.0000 mg/kg | Freq: Once | SUBCUTANEOUS | Status: AC
  Administered 2017-10-07: 120 mg via SUBCUTANEOUS
  Filled 2017-10-06: qty 0.8

## 2017-10-06 MED ORDER — HYDROCODONE-ACETAMINOPHEN 5-325 MG PO TABS: 1.0000 | ORAL_TABLET | Freq: Four times a day (QID) | ORAL | 0 refills | Status: DC | PRN

## 2017-10-06 NOTE — ED Provider Notes (Signed)
[facility] Emergency Department Provider Note ____________________________________________   First MD Initiated Contact with Patient 10/06/17 2152     (approximate)  I have reviewed the triage vital signs and the nursing notes.   HISTORY  Chief Complaint Fever and Leg Swelling    HPI Edgar [name] is a 27 y.o. male with PMH as noted below including history of metastatic testicular cancer who presents with leg swelling and fever over the course of 1 day, associated with a rash.  The patient states that he was initially treated for testicular cancer in 1856 and had no complications.  He then developed left leg swelling which was diagnosed as a DVT about 1 month ago.  He has been on Xarelto, and was subsequently diagnosed with recurrent cancer in his pelvis.  He had one round of chemotherapy 2 weeks ago.  He has been following at [facility], but wants to transfer his care here.  The patient states that the swelling in the leg got acutely worse, and the rash is new.  The fever is also new.  He denies cough, shortness of breath, vomiting, diarrhea, or urinary symptoms.   Past Medical History:  Diagnosis Date  . Asthma    AS A CHILD-NO INHALERS  . Cancer ([place])    testicular cancer 11/2016 per pt   . GERD (gastroesophageal reflux disease)    TUMS PRN    Patient Active Problem List   Diagnosis Date Noted  . Current moderate episode of major depressive disorder without prior episode ([place]) 11/20/2016  . Encounter for antineoplastic chemotherapy 01/19/2016  . Testicular cancer ([place]) 11/26/2015    Past Surgical History:  Procedure Laterality Date  . ANKLE FRACTURE SURGERY Right 2004  . ORCHIECTOMY Left 11/26/2015   Procedure: ORCHIECTOMY;  Surgeon: [name], MD;  Location: [facility] ORS;  Service: Urology;  Laterality: Left;  radical/Inguinal approach  . PERIPHERAL VASCULAR CATHETERIZATION N/A 12/16/2015   Procedure: [name] Cath Insertion;  Surgeon: [name], MD;   Location: [place];  Service: Cardiovascular;  Laterality: N/A;  . WISDOM TOOTH EXTRACTION  2017    Prior to Admission medications   Medication Sig Start Date End Date Taking? Authorizing Provider  DULoxetine (CYMBALTA) 60 MG capsule Take 1 tablet by mouth daily. 10/05/17 10/05/18 Yes [provider]  KLOR-CON M20 20 MEQ tablet Take 1 tablet by mouth 2 (two) times daily. 10/04/17  Yes [provider]  magnesium oxide (MAG-OX) 400 MG tablet Take 1 tablet by mouth daily. 10/04/17  Yes [provider]  ondansetron (ZOFRAN-ODT) 4 MG disintegrating tablet Take 2 tablets by mouth every 8 (eight) hours as needed. 09/26/17  Yes [provider]  prochlorperazine (COMPAZINE) 10 MG tablet Take 1 tablet by mouth every 6 (six) hours as needed. 09/26/17  Yes [provider]  XARELTO 20 MG TABS tablet Take 1 tablet by mouth daily. 09/26/17  Yes [provider]  busPIRone (BUSPAR) 10 MG tablet TAKE 1/2 - 1 AND 1/2 (5-15MG ) BY MOUTH 3 TIMES A DAY Patient not taking: Reported on 10/06/2017 01/04/17   Park Liter P, DO  clindamycin (CLEOCIN) 300 MG capsule Take 1 capsule (300 mg total) by mouth 3 (three) times daily for 10 days. 10/07/17 10/17/17  Arta Silence, MD  HYDROcodone-acetaminophen (NORCO/VICODIN) 5-325 MG tablet Take 1 tablet by mouth every 6 (six) hours as needed for up to 5 days for moderate pain. 10/06/17 10/11/17  Arta Silence, MD  sertraline (ZOLOFT) 50 MG tablet 1/2  tab a day for 1 week, 1 tab daily after that Patient not taking: Reported on 12/24/2016 11/20/16   Valerie Roys, DO    Allergies Patient has no known allergies.  Family History  Problem Relation Age of Onset  . Skin cancer Mother   . Breast cancer Maternal Grandmother   . Colon cancer Maternal Grandfather   . Diabetes Maternal Grandfather   . Emphysema Father   . Mental illness Sister        anxiety  . Stroke Paternal Grandmother   . Prostate cancer Neg  Hx   . Kidney cancer Neg Hx   . Bladder Cancer Neg Hx     Social History Social History   Tobacco Use  . Smoking status: Current Every Day Smoker    Packs/day: 0.50    Years: 8.00    Pack years: 4.00    Types: Cigarettes  . Smokeless tobacco: Former Systems developer    Types: Snuff  Substance Use Topics  . Alcohol use: No  . Drug use: Yes    Types: Marijuana    Review of Systems  Constitutional: Positive for fever. Eyes: No redness. ENT: No sore throat. Cardiovascular: Denies chest pain. Respiratory: Denies shortness of breath. Gastrointestinal: No vomiting or diarrhea. Genitourinary: Negative for dysuria.  Musculoskeletal: Negative for back pain. Skin: Positive for rash. Neurological: Negative for headache.   ____________________________________________   PHYSICAL EXAM:  VITAL SIGNS: ED Triage Vitals [10/06/17 2121]  Enc Vitals Group     BP 135/79     Pulse Rate (!) 108     Resp 20     Temp 100.2 F (37.9 C)     Temp Source Oral     SpO2 98 %     Weight 260 lb (117.9 kg)     Height 6\' 3"  (1.905 m)     Head Circumference      Peak Flow      Pain Score 7     Pain Loc      Pain Edu?      Excl. in [place]?     Constitutional: Alert and oriented.  Relatively well appearing and in no acute distress. Eyes: Conjunctivae are normal.  Head: Atraumatic. Nose: No congestion/rhinnorhea. Mouth/Throat: Mucous membranes are moist.   Neck: Normal range of motion.  Cardiovascular: Normal rate, regular rhythm. Grossly normal heart sounds.  Good peripheral circulation. Respiratory: Normal respiratory effort.  No retractions. Lungs CTAB. Gastrointestinal: Soft and nontender. No distention.  Genitourinary: No flank tenderness. Musculoskeletal: No lower extremity edema.  Extremities warm and well perfused.  Neurologic:  Normal speech and language. No gross focal neurologic deficits are appreciated.  Skin:  Skin is warm and dry. No rash noted. Psychiatric: Mood and affect are  normal. Speech and behavior are normal.  ____________________________________________   LABS (all labs ordered are listed, but only abnormal results are displayed)  Labs Reviewed  CBC WITH DIFFERENTIAL/PLATELET - Abnormal; Notable for the following components:      Result Value   RBC 2.93 (*)    Hemoglobin 8.9 (*)    HCT 26.1 (*)    Platelets 81 (*)    Neutro Abs 7.4 (*)    Lymphs Abs 0.8 (*)    Monocytes Absolute 1.1 (*)    All other components within normal limits  COMPREHENSIVE METABOLIC PANEL - Abnormal; Notable for the following components:   Potassium 3.3 (*)    Chloride 97 (*)    Glucose, Bld 110 (*)    Calcium  8.7 (*)    Total Protein 6.2 (*)    Albumin 3.2 (*)    AST 12 (*)    All other components within normal limits  SEDIMENTATION RATE - Abnormal; Notable for the following components:   Sed Rate 73 (*)    All other components within normal limits  PROTIME-INR - Abnormal; Notable for the following components:   Prothrombin Time 24.8 (*)    All other components within normal limits  CULTURE, BLOOD (ROUTINE X 2)  CULTURE, BLOOD (ROUTINE X 2)  LACTIC ACID, PLASMA  LACTIC ACID, PLASMA  URINALYSIS, COMPLETE (UACMP) WITH MICROSCOPIC  C-REACTIVE PROTEIN   ____________________________________________  EKG   ____________________________________________  RADIOLOGY  CXR: Peribronchial thickening with no focal infiltrate  ____________________________________________   PROCEDURES  Procedure(s) performed: No  Procedures  Critical Care performed: No ____________________________________________   INITIAL IMPRESSION / ASSESSMENT AND PLAN / ED COURSE  Pertinent labs & imaging results that were available during my care of the patient were reviewed by me and considered in my medical decision making (see chart for details).  28 year old male with history of possible metastatic recurrent testicular cancer and DVT on Xarelto presents with worsening left leg  swelling, rash, and fever over the last day.  His last chemo was 2 weeks ago.  On exam, the patient has a low-grade temperature but other vital signs are normal.  He is overall relatively well-appearing.  The inner left thigh has erythema and induration.  The left leg is swollen, however this is chronic.  The remainder of the exam is unremarkable.  Overall I suspect most likely acute cellulitis.  There is no obvious wound or other entry point, however the patient does have some small excoriations to his lower leg.  He has no other infectious symptoms.  We will obtain sepsis work-up, give symptomatic treatment, and antibiotics.  Given that the patient is relatively well-appearing and has normal vital signs, if there are no evident is no evidence of sepsis or immunocompromise, he may be appropriate for discharge home.  If concerning lab findings, I may admit.   ----------------------------------------- 12:00 AM on 10/07/2017 -----------------------------------------  The patient's lab work-up is overall reassuring.  His WBC count is normal, his lactate is negative, and his kidney function and chemistry are otherwise essentially within normal limits.  His vital signs have remained stable.  Based on this, the patient does not require admission and could potentially be treated as an outpatient for cellulitis.  I discussed this with the patient and his mother, and he expresses a strong preference to go home if at all possible.  The patient and his mother very concerned about the fact that he missed his dose of Xarelto today.  He is supposed to start taking it in the morning, so rather than giving a double dose by giving it this evening, I will give a dose of Lovenox to cover him in terms of the anticoagulation for tonight and then allow him to restart his Xarelto on schedule tomorrow morning.  I will give a dose of IV clindamycin in the ED prior to discharge, and continue a 10-day course of clindamycin at  home.  The patient requests to follow-up with Dr. Mike Gip from the cancer center, who he saw for his previous bout of cancer, so I will provide this referral.  I had an extensive discussion with the patient and his mother about the results of the work-up, the plan of care, the follow-up, and return precautions.  They expressed understanding  and agreement.    ____________________________________________   FINAL CLINICAL IMPRESSION(S) / ED DIAGNOSES  Final diagnoses:  Cellulitis of left lower extremity      NEW MEDICATIONS STARTED DURING THIS VISIT:  New Prescriptions   CLINDAMYCIN (CLEOCIN) 300 MG CAPSULE    Take 1 capsule (300 mg total) by mouth 3 (three) times daily for 10 days.   HYDROCODONE-ACETAMINOPHEN (NORCO/VICODIN) 5-325 MG TABLET    Take 1 tablet by mouth every 6 (six) hours as needed for up to 5 days for moderate pain.     Note:  This document was prepared using Dragon voice recognition software and may include unintentional dictation errors.    Arta Silence, MD 10/07/17 0002

## 2017-10-06 NOTE — ED Triage Notes (Signed)
Pt presents to ED with c/o fever and swelling to his left leg. Dx early July with clot in his left leg and cancer. Currently undergoing chemo and taking blood thinner. Hx of testicular cancer in 2017 and currently unsure what type of cancer pt has due to risk of complications from biopsy because of the clot. Pt reports fever of 102 today that came down with tylenol.

## 2017-10-07 ENCOUNTER — Telehealth: Payer: Self-pay | Admitting: *Deleted

## 2017-10-07 LAB — C-REACTIVE PROTEIN: CRP: 13.1 mg/dL — AB (ref <1.0)

## 2017-10-07 NOTE — Discharge Instructions (Addendum)
Restart taking your Xarelto with tomorrow morning's dose, and continue to take it as prescribed.  Take the antibiotic (clindamycin) starting tomorrow morning and finish the full course.  Follow-up with Dr. Mike Gip as soon as you are able to.  If you are unable to get follow-up with Dr. Mike Gip, you should follow-up with your doctors at [facility] for now to avoid delaying your care.  Return to the ER for new, worsening, or persistent swelling in the left leg, worsening rash, rash spreading up the leg, fevers, vomiting, weakness, chest pain, difficulty breathing, or any other new or worsening symptoms that concern he.

## 2017-10-07 NOTE — Telephone Encounter (Signed)
Patient's mother Edgar [name]) called and stated that they are not happy with [facility],  and that he wants to come back here to seen by Dr. Mike Gip.  She said that he has gotten worst since he has been going there.  She also stated that his next infusion is on 10/11/17 @ [facility].  Patient was last seen in [place] on 12/24/16. She asked if the NP or MD could please give her a call back today @ [phone].

## 2017-10-07 NOTE — Telephone Encounter (Signed)
Returned call to patient's mother.  She states patient went to Urgent Care in [facility] because of swelling in his leg. She states his upper leg is red and swollen.  He was told that he had a blood clot and that his testicular cancer has recurred. Patient was transported to [facility] where he underwent chemo treatment for cancer in his pelvic area and multiple lymph nodes (did not count them all). Mother states he now has infection in his leg and is almost unable to walk.  They are unhappy with the care they are getting at [facility] and wants to transfer back to [facility] to see Dr. Mike Gip. Mother has called multiple times today and left messages for [place] to call her.  I spoke with Honor Loh, NP and we will see patient tomorrow morning @ 10:30  Mother was informed and states they will be here.

## 2017-10-08 ENCOUNTER — Inpatient Hospital Stay: Payer: BLUE CROSS/BLUE SHIELD

## 2017-10-08 ENCOUNTER — Other Ambulatory Visit: Payer: Self-pay

## 2017-10-08 ENCOUNTER — Inpatient Hospital Stay: Payer: BLUE CROSS/BLUE SHIELD | Attending: Hematology and Oncology | Admitting: Hematology and Oncology

## 2017-10-08 ENCOUNTER — Other Ambulatory Visit: Payer: Self-pay | Admitting: Hematology and Oncology

## 2017-10-08 ENCOUNTER — Inpatient Hospital Stay
Admission: AD | Admit: 2017-10-08 | Discharge: 2017-10-16 | DRG: 854 | Disposition: A | Discharge status: Home Health | Payer: BLUE CROSS/BLUE SHIELD | Source: Ambulatory Visit | Attending: Internal Medicine | Admitting: Internal Medicine

## 2017-10-08 VITALS — BP 111/70 | HR 122 | Temp 99.5°F | Resp 20 | Wt 269.7 lb

## 2017-10-08 DIAGNOSIS — N2889 Other specified disorders of kidney and ureter: Secondary | ICD-10-CM | POA: Diagnosis not present

## 2017-10-08 DIAGNOSIS — M545 Low back pain: Secondary | ICD-10-CM | POA: Diagnosis not present

## 2017-10-08 DIAGNOSIS — R0602 Shortness of breath: Secondary | ICD-10-CM | POA: Diagnosis not present

## 2017-10-08 DIAGNOSIS — A419 Sepsis, unspecified organism: Secondary | ICD-10-CM | POA: Diagnosis present

## 2017-10-08 DIAGNOSIS — I82412 Acute embolism and thrombosis of left femoral vein: Secondary | ICD-10-CM | POA: Diagnosis present

## 2017-10-08 DIAGNOSIS — Z7952 Long term (current) use of systemic steroids: Secondary | ICD-10-CM | POA: Diagnosis not present

## 2017-10-08 DIAGNOSIS — Z79899 Other long term (current) drug therapy: Secondary | ICD-10-CM

## 2017-10-08 DIAGNOSIS — I82402 Acute embolism and thrombosis of unspecified deep veins of left lower extremity: Secondary | ICD-10-CM | POA: Diagnosis not present

## 2017-10-08 DIAGNOSIS — Z808 Family history of malignant neoplasm of other organs or systems: Secondary | ICD-10-CM | POA: Insufficient documentation

## 2017-10-08 DIAGNOSIS — C6212 Malignant neoplasm of descended left testis: Secondary | ICD-10-CM | POA: Diagnosis present

## 2017-10-08 DIAGNOSIS — Z800 Family history of malignant neoplasm of digestive organs: Secondary | ICD-10-CM | POA: Diagnosis not present

## 2017-10-08 DIAGNOSIS — M79605 Pain in left leg: Secondary | ICD-10-CM | POA: Diagnosis not present

## 2017-10-08 DIAGNOSIS — J45909 Unspecified asthma, uncomplicated: Secondary | ICD-10-CM | POA: Diagnosis present

## 2017-10-08 DIAGNOSIS — F17210 Nicotine dependence, cigarettes, uncomplicated: Secondary | ICD-10-CM | POA: Diagnosis present

## 2017-10-08 DIAGNOSIS — L0390 Cellulitis, unspecified: Secondary | ICD-10-CM

## 2017-10-08 DIAGNOSIS — I871 Compression of vein: Secondary | ICD-10-CM | POA: Diagnosis present

## 2017-10-08 DIAGNOSIS — D696 Thrombocytopenia, unspecified: Secondary | ICD-10-CM | POA: Diagnosis not present

## 2017-10-08 DIAGNOSIS — K219 Gastro-esophageal reflux disease without esophagitis: Secondary | ICD-10-CM | POA: Diagnosis present

## 2017-10-08 DIAGNOSIS — I82422 Acute embolism and thrombosis of left iliac vein: Secondary | ICD-10-CM | POA: Diagnosis present

## 2017-10-08 DIAGNOSIS — Z9221 Personal history of antineoplastic chemotherapy: Secondary | ICD-10-CM | POA: Insufficient documentation

## 2017-10-08 DIAGNOSIS — R197 Diarrhea, unspecified: Secondary | ICD-10-CM | POA: Insufficient documentation

## 2017-10-08 DIAGNOSIS — C6290 Malignant neoplasm of unspecified testis, unspecified whether descended or undescended: Secondary | ICD-10-CM | POA: Diagnosis present

## 2017-10-08 DIAGNOSIS — Z5320 Procedure and treatment not carried out because of patient's decision for unspecified reasons: Secondary | ICD-10-CM | POA: Diagnosis present

## 2017-10-08 DIAGNOSIS — Z7901 Long term (current) use of anticoagulants: Secondary | ICD-10-CM

## 2017-10-08 DIAGNOSIS — Z79891 Long term (current) use of opiate analgesic: Secondary | ICD-10-CM

## 2017-10-08 DIAGNOSIS — I82432 Acute embolism and thrombosis of left popliteal vein: Secondary | ICD-10-CM | POA: Diagnosis present

## 2017-10-08 DIAGNOSIS — C775 Secondary and unspecified malignant neoplasm of intrapelvic lymph nodes: Secondary | ICD-10-CM | POA: Diagnosis present

## 2017-10-08 DIAGNOSIS — I824Y2 Acute embolism and thrombosis of unspecified deep veins of left proximal lower extremity: Secondary | ICD-10-CM

## 2017-10-08 DIAGNOSIS — D649 Anemia, unspecified: Secondary | ICD-10-CM

## 2017-10-08 DIAGNOSIS — Z803 Family history of malignant neoplasm of breast: Secondary | ICD-10-CM | POA: Insufficient documentation

## 2017-10-08 DIAGNOSIS — R0902 Hypoxemia: Secondary | ICD-10-CM | POA: Diagnosis present

## 2017-10-08 DIAGNOSIS — L03116 Cellulitis of left lower limb: Secondary | ICD-10-CM | POA: Diagnosis present

## 2017-10-08 DIAGNOSIS — R52 Pain, unspecified: Secondary | ICD-10-CM

## 2017-10-08 DIAGNOSIS — M7989 Other specified soft tissue disorders: Secondary | ICD-10-CM | POA: Diagnosis present

## 2017-10-08 DIAGNOSIS — L98491 Non-pressure chronic ulcer of skin of other sites limited to breakdown of skin: Secondary | ICD-10-CM | POA: Diagnosis not present

## 2017-10-08 DIAGNOSIS — R51 Headache: Secondary | ICD-10-CM

## 2017-10-08 DIAGNOSIS — Z8547 Personal history of malignant neoplasm of testis: Secondary | ICD-10-CM

## 2017-10-08 DIAGNOSIS — G893 Neoplasm related pain (acute) (chronic): Secondary | ICD-10-CM | POA: Diagnosis not present

## 2017-10-08 DIAGNOSIS — Z7189 Other specified counseling: Secondary | ICD-10-CM

## 2017-10-08 DIAGNOSIS — C772 Secondary and unspecified malignant neoplasm of intra-abdominal lymph nodes: Secondary | ICD-10-CM | POA: Diagnosis present

## 2017-10-08 DIAGNOSIS — C8335 Diffuse large B-cell lymphoma, lymph nodes of inguinal region and lower limb: Secondary | ICD-10-CM | POA: Diagnosis not present

## 2017-10-08 DIAGNOSIS — D709 Neutropenia, unspecified: Secondary | ICD-10-CM | POA: Diagnosis not present

## 2017-10-08 DIAGNOSIS — C6292 Malignant neoplasm of left testis, unspecified whether descended or undescended: Secondary | ICD-10-CM | POA: Diagnosis not present

## 2017-10-08 DIAGNOSIS — I82409 Acute embolism and thrombosis of unspecified deep veins of unspecified lower extremity: Secondary | ICD-10-CM

## 2017-10-08 LAB — COMPREHENSIVE METABOLIC PANEL
ALK PHOS: 54 U/L (ref 38–126)
ALT: 12 U/L (ref 0–44)
ANION GAP: 8 (ref 5–15)
AST: 14 U/L — ABNORMAL LOW (ref 15–41)
Albumin: 3 g/dL — ABNORMAL LOW (ref 3.5–5.0)
BILIRUBIN TOTAL: 0.6 mg/dL (ref 0.3–1.2)
BUN: 10 mg/dL (ref 6–20)
CALCIUM: 8.5 mg/dL — AB (ref 8.9–10.3)
CO2: 28 mmol/L (ref 22–32)
CREATININE: 0.95 mg/dL (ref 0.61–1.24)
Chloride: 97 mmol/L — ABNORMAL LOW (ref 98–111)
Glucose, Bld: 117 mg/dL — ABNORMAL HIGH (ref 70–99)
Potassium: 3.6 mmol/L (ref 3.5–5.1)
SODIUM: 133 mmol/L — AB (ref 135–145)
Total Protein: 6.4 g/dL — ABNORMAL LOW (ref 6.5–8.1)

## 2017-10-08 LAB — CBC
HCT: 26.3 % — ABNORMAL LOW (ref 40.0–52.0)
HEMOGLOBIN: 8.9 g/dL — AB (ref 13.0–18.0)
MCH: 30.3 pg (ref 26.0–34.0)
MCHC: 33.7 g/dL (ref 32.0–36.0)
MCV: 89.9 fL (ref 80.0–100.0)
PLATELETS: 137 10*3/uL — AB (ref 150–440)
RBC: 2.93 MIL/uL — AB (ref 4.40–5.90)
RDW: 12.4 % (ref 11.5–14.5)
WBC: 12.1 10*3/uL — AB (ref 3.8–10.6)

## 2017-10-08 LAB — C DIFFICILE QUICK SCREEN W PCR REFLEX
C Diff antigen: NEGATIVE
C Diff interpretation: NOT DETECTED
C Diff toxin: NEGATIVE

## 2017-10-08 LAB — LACTIC ACID, PLASMA
LACTIC ACID, VENOUS: 0.9 mmol/L (ref 0.5–1.9)
Lactic Acid, Venous: 1.3 mmol/L (ref 0.5–1.9)

## 2017-10-08 LAB — APTT: aPTT: 44 seconds — ABNORMAL HIGH (ref 24–36)

## 2017-10-08 LAB — PROTIME-INR
INR: 1.82
PROTHROMBIN TIME: 20.9 s — AB (ref 11.4–15.2)

## 2017-10-08 LAB — HEPARIN LEVEL (UNFRACTIONATED): Heparin Unfractionated: 3.16 IU/mL — ABNORMAL HIGH (ref 0.30–0.70)

## 2017-10-08 MED ORDER — LOPERAMIDE HCL 2 MG PO CAPS: 2.0000 mg | ORAL_CAPSULE | Freq: Four times a day (QID) | ORAL | Status: DC | PRN

## 2017-10-08 MED ORDER — MORPHINE SULFATE (PF) 2 MG/ML IV SOLN
2.0000 mg | INTRAVENOUS | Status: DC | PRN
  Administered 2017-10-08 – 2017-10-13 (×16): 2 mg via INTRAVENOUS
  Filled 2017-10-08 (×17): qty 1

## 2017-10-08 MED ORDER — VANCOMYCIN HCL 10 G IV SOLR
1500.0000 mg | Freq: Once | INTRAVENOUS | Status: AC
  Administered 2017-10-08: 18:00:00 1500 mg via INTRAVENOUS
  Filled 2017-10-08: qty 1500

## 2017-10-08 MED ORDER — DOCUSATE SODIUM 100 MG PO CAPS: 100.0000 mg | ORAL_CAPSULE | Freq: Two times a day (BID) | ORAL | Status: DC | PRN

## 2017-10-08 MED ORDER — VANCOMYCIN HCL 10 G IV SOLR
1500.0000 mg | Freq: Two times a day (BID) | INTRAVENOUS | Status: DC
  Administered 2017-10-09 – 2017-10-10 (×3): 1500 mg via INTRAVENOUS
  Filled 2017-10-08 (×5): qty 1500

## 2017-10-08 MED ORDER — SERTRALINE HCL 50 MG PO TABS: 25.0000 mg | ORAL_TABLET | Freq: Every day | ORAL | Status: DC

## 2017-10-08 MED ORDER — OXYCODONE HCL 5 MG PO TABS
10.0000 mg | ORAL_TABLET | ORAL | Status: DC | PRN
  Administered 2017-10-08 – 2017-10-16 (×25): 10 mg via ORAL
  Filled 2017-10-08 (×25): qty 2

## 2017-10-08 MED ORDER — SODIUM CHLORIDE 0.9 % IV SOLN
2.0000 g | Freq: Two times a day (BID) | INTRAVENOUS | Status: DC
  Administered 2017-10-08 – 2017-10-16 (×15): 2 g via INTRAVENOUS
  Filled 2017-10-08 (×20): qty 2

## 2017-10-08 MED ORDER — CLINDAMYCIN PHOSPHATE 300 MG/50ML IV SOLN
300.0000 mg | Freq: Three times a day (TID) | INTRAVENOUS | Status: DC
  Administered 2017-10-08 – 2017-10-11 (×9): 300 mg via INTRAVENOUS
  Filled 2017-10-08 (×11): qty 50

## 2017-10-08 MED ORDER — ACETAMINOPHEN 325 MG PO TABS
650.0000 mg | ORAL_TABLET | ORAL | Status: DC | PRN
  Administered 2017-10-08: 650 mg via ORAL
  Filled 2017-10-08: qty 2

## 2017-10-08 MED ORDER — DULOXETINE HCL 30 MG PO CPEP
60.0000 mg | ORAL_CAPSULE | Freq: Every day | ORAL | Status: DC
  Administered 2017-10-08 – 2017-10-16 (×8): 60 mg via ORAL
  Filled 2017-10-08 (×8): qty 2

## 2017-10-08 MED ORDER — HEPARIN BOLUS VIA INFUSION
5000.0000 [IU] | Freq: Once | INTRAVENOUS | Status: AC
  Administered 2017-10-08: 19:00:00 5000 [IU] via INTRAVENOUS
  Filled 2017-10-08: qty 5000

## 2017-10-08 MED ORDER — POLYETHYLENE GLYCOL 3350 17 G PO PACK: 17.0000 g | PACK | Freq: Every day | ORAL | Status: DC

## 2017-10-08 MED ORDER — NICOTINE 21 MG/24HR TD PT24
21.0000 mg | MEDICATED_PATCH | Freq: Every day | TRANSDERMAL | Status: DC
  Administered 2017-10-08 – 2017-10-16 (×8): 21 mg via TRANSDERMAL
  Filled 2017-10-08 (×8): qty 1

## 2017-10-08 MED ORDER — HEPARIN (PORCINE) IN NACL 100-0.45 UNIT/ML-% IJ SOLN
2750.0000 [IU]/h | INTRAMUSCULAR | Status: DC
  Administered 2017-10-08 (×2): 1500 [IU]/h via INTRAVENOUS
  Administered 2017-10-09: 1900 [IU]/h via INTRAVENOUS
  Administered 2017-10-09: 1500 [IU]/h via INTRAVENOUS
  Administered 2017-10-10: 23:00:00 2300 [IU]/h via INTRAVENOUS
  Administered 2017-10-11: 2750 [IU]/h via INTRAVENOUS
  Administered 2017-10-11: 09:00:00 2500 [IU]/h via INTRAVENOUS
  Administered 2017-10-12 – 2017-10-13 (×4): 2750 [IU]/h via INTRAVENOUS
  Filled 2017-10-08 (×11): qty 250

## 2017-10-08 MED ORDER — ONDANSETRON 8 MG PO TBDP
8.0000 mg | ORAL_TABLET | Freq: Three times a day (TID) | ORAL | Status: DC | PRN
Start: 2017-10-08 — End: 2017-10-16
  Filled 2017-10-08: qty 1

## 2017-10-08 MED ORDER — IOPAMIDOL (ISOVUE-300) INJECTION 61%
100.0000 mL | Freq: Once | INTRAVENOUS | Status: AC | PRN
  Administered 2017-10-08: 21:00:00 100 mL via INTRAVENOUS

## 2017-10-08 NOTE — Progress Notes (Signed)
DISCONTINUE SELECTED CLINICAL TRIAL - Testicular  Other Clinical Trial: BEP  **Trial eligibility and accrual should be confirmed by your research team**  REASON: Disease Progression PRIOR TREATMENT: Other Trial - BEP TREATMENT RESPONSE: Complete Response (CR)    Patient Characteristics: Current evidence of distant metastases<= Yes

## 2017-10-08 NOTE — Progress Notes (Signed)
Pharmacy Antibiotic Note  Edgar [name] is a 27 y.o. male admitted on 10/08/2017 with cellulitis.  Pharmacy has been consulted for Vancomycin and Cefepime dosing.  Plan: Vancomycin 1500 mg IV every 12 hours.  Goal trough 15-20 mcg/mL. Cefepime 2g IV q12h  Will need to watch for accumulation in this patient. Will check a Vancomycin trough prior to 5th dose.  Height: 6\' 3"  (190.5 cm) Weight: 268 lb 1.6 oz (121.6 kg) IBW/kg (Calculated) : 84.5  Temp (24hrs), Avg:100.9 F (38.3 C), Min:99.5 F (37.5 C), Max:102.3 F (39.1 C)  Recent Labs  Lab 10/06/17 2222  WBC 9.2  CREATININE 0.73  LATICACIDVEN 0.9    Estimated Creatinine Clearance: 191.4 mL/min (by C-G formula based on SCr of 0.73 mg/dL).    No Known Allergies  Antimicrobials this admission: Vancomycin 7/26 >>  Cefepime 7/26 >>  Clindamycin 7/26 >>   Thank you for allowing pharmacy to be a part of this patient's care.  Paulina Fusi, PharmD, BCPS 10/08/2017 4:21 PM

## 2017-10-08 NOTE — Progress Notes (Signed)
[facility] day:  10/08/2017  Chief Complaint: Edgar [name] is a 27 y.o. male with recurrent testicular cancer who is seen for reassessment.  HPI: The patient was last seen in the medical oncology clinic on 12/24/2016.  At that time, he denied any complaint.  Exam was normal.  He continued to smoke.  Tumor markers were normal.  He was scheduled to follow-up in 03/2017.  Imaging was ordered.  He did not follow-up secondary to loss job and no insurance.  He developed sudden swelling of his LEFT lower extremity.  He went to [facility] ER.  Left lower extremity duplex on 09/10/2017 revealed evidence of obstruction in the left CFV, SFJ, and proximal femoral vein. CT imaging noting multiple retroperitoneal mass causing compression of the left external iliac vein and associated thrombus.  He was transferred to [facility].  He was admitted to [facility] from 09/10/2017 - 09/14/2017 for further workup and treatment. Upon workup, his tumor markers were found to be elevated and imaging reported retroperitoneal adenopathy consistent with germ cell tumor recurrence. He was initiated on a heparin drip and thus biopsy was not felt needed given the adverse risk in discontinuing anticoagulation in the acute setting and typical recurrence pattern. He was discharged on Xarelto.  CT pelvic venogram on 09/10/2017 revealed multiple retroperitoneal masses including large mass within the left iliacus concerning for metastatic disease in the setting of prior testicular cancer.  Mass in the left iliacus caused significant narrowing of the proximal left external iliac vein. There was filling defect within the distal left external iliac vein extending into the common femoral and proximal superficial femoral veins consistent with thrombus   Chest, abdomen, and pelvic CT on 09/11/2017 revealed enlarged and morphologically abnormal 4.3 x 4.9 cm left pelvic sidewall and retroperitoneal  lymph nodes (measuring up to 2.9 cm), concerning for metastatic disease given history of testicular cancer. Biopsy was suggested for confirmation. Prominent left inguinal lymph nodes could be metastatic or reactive in nature.  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  There was anasarca/swelling of the left lower extremity.  There was no evidence of metastatic disease in the chest.  Head MRI on 09/13/2017 revealed no evidence of metastatic disease.  Tumor markers: AFP: 1 on 09/10/2017, 1 on 09/20/2017. Beta-HCG: < 5 on 09/11/2017 and < 5 on 09/20/2017. LDH: 1557 on 09/11/2017 and 916 on 09/20/2017.  While hospitalized, he was seen by GU oncology.  Recommendations were for 4 cycles of TIP every 3 weeks.  He requires inpatient stay secondary to continuous infusion therapy.  Plan was for reassessment and then consideration of RPLD if residual disease.  He was admitted to [facility] from 09/21/2017 - 09/26/2017.  He tolerated cycle #1 TIP chemotherapy well. He took Decadron x 3 days after chemo.  On day 7 (09/27/2017), he began Levaquin x 7 days.  He received Udencya on 09/27/2017.  He did not require transfusion support (PRBCs or platelets) following his treatment. Patient followed by Dr. Tora Perches [phone]).  He returned to to the [facility] infusion center on 09/27/2017 and 09/28/2017 for IVFs and potassium replacement. Patient'e SBP was in the 70s at that time per his report. He again returned for additional IVF and electrolyte supplementation on 10/04/2017.  He presented to the [facility] ER on 10/06/2017 with left leg swelling and fever with an associated rash. Tmax at home was 102, however patient took APAP  prior to arrival, thus when he arrived his temperature was down to 100.2. Left inner thigh was erythematous and indurated.  He was felt to have cellulitis.  He received IV clindamycin and a 10 day course of oral clindamycin 300 mg TID.  He was also started on oral potassium and magnesium supplementation.   Labs on 10/06/2017 revealed a hematocrit of 26.1, hemoglobin 8.9, platelets 81,000, WBC 9200 with an ANC of 7400.  Potassium was 3.3. Albumin was 3.2.  Sed rate was 73 and CRP 13.1.  Patient presents to clinic today with pain and significant swelling to his LEFT lower extremity that extends from his groin to his foot. Patient continues on oral clindamycin. He notes that his leg "looks worse". Leg is "red with black spots". He continues to have low grade temperatures (Tmax 99.5). He denies chills.   Patient denies significant weight loss. He experiences early satiety. He is not sleeping well (3 hours/night). He has intermittent headaches. Patient has exertional shortness of breath. Patient had diarrhea for about 5 days following his chemotherapy. He notes that he was "fine" for a few days. He started having diarrhea again on 10/07/2017. He has pain in his lower back. He notes difficulty ambulating.   Past Medical History:  Diagnosis Date  . Asthma    AS A CHILD-NO INHALERS  . Cancer ([place])    testicular cancer 11/2016 per pt   . GERD (gastroesophageal reflux disease)    TUMS PRN    Past Surgical History:  Procedure Laterality Date  . ANKLE FRACTURE SURGERY Right 2004  . ORCHIECTOMY Left 11/26/2015   Procedure: ORCHIECTOMY;  Surgeon: [name], MD;  Location: [facility] ORS;  Service: Urology;  Laterality: Left;  radical/Inguinal approach  . PERIPHERAL VASCULAR CATHETERIZATION N/A 12/16/2015   Procedure: [name] Cath Insertion;  Surgeon: [name], MD;  Location: [place] CV LAB;  Service: Cardiovascular;  Laterality: N/A;  . WISDOM TOOTH EXTRACTION  2017    Family History  Problem Relation Age of Onset  . Skin cancer Mother   . Breast cancer Maternal Grandmother   . Colon cancer Maternal Grandfather   . Diabetes Maternal Grandfather   . Emphysema Father   . Mental illness Sister        anxiety  . Stroke  Paternal Grandmother   . Prostate cancer Neg Hx   . Kidney cancer Neg Hx   . Bladder Cancer Neg Hx     Social History:  reports that he has been smoking cigarettes.  He has a 4.00 pack-year smoking history. He has quit using smokeless tobacco. His smokeless tobacco use included snuff. He reports that he has current or past drug history. Drug: Marijuana. He reports that he does not drink alcohol.  He smokes 1/2 pack a day.  He smokes marijuana.  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  He has a 107 year-old-daughter named [name]. The patient's mother's cell phone number is [phone].  The patient lives in [place]. He is accompanied by his mother today.   Allergies: No Known Allergies  Current Medications: Current Outpatient Medications  Medication Sig Dispense Refill  . clindamycin (CLEOCIN) 300 MG capsule Take 1 capsule (300 mg total) by mouth 3 (three) times daily for 10 days. 30 capsule 0  . HYDROcodone-acetaminophen (NORCO/VICODIN) 5-325 MG tablet Take 1 tablet by mouth every 6 (six) hours as needed for up to 5 days for moderate pain. 15 tablet 0  . KLOR-CON M20 20 MEQ  tablet Take 1 tablet by mouth 2 (two) times daily.    . magnesium oxide (MAG-OX) 400 MG tablet Take 1 tablet by mouth daily.    Alveda Reasons 20 MG TABS tablet Take 1 tablet by mouth daily.    Marland Kitchen acetaminophen (TYLENOL) 500 MG tablet Take by mouth.    . busPIRone (BUSPAR) 10 MG tablet TAKE 1/2 - 1 AND 1/2 (5-15MG) BY MOUTH 3 TIMES A DAY (Patient not taking: Reported on 10/08/2017) 30 tablet 0  . dexamethasone (DECADRON) 4 MG tablet Take 2 tablets (53m total) by mouth daily for 3 days after chemotherapy to prevent nausea    . DULoxetine (CYMBALTA) 60 MG capsule Take 1 tablet by mouth daily.    .Marland Kitchenlevofloxacin (LEVAQUIN) 500 MG tablet Take 1 tablet by mouth daily on days 7-13 of every chemotherapy cycle to prevent infection    . nicotine (NICODERM CQ - DOSED IN MG/24 HR) 7 mg/24hr patch Place onto the skin.    .Marland Kitchen ondansetron (ZOFRAN-ODT) 4 MG disintegrating tablet Take 2 tablets by mouth every 8 (eight) hours as needed.    . Oxycodone HCl 10 MG TABS     . polyethylene glycol (MIRALAX / GLYCOLAX) packet Take by mouth.    . prochlorperazine (COMPAZINE) 10 MG tablet Take 1 tablet by mouth every 6 (six) hours as needed.    . sertraline (ZOLOFT) 50 MG tablet 1/2 tab a day for 1 week, 1 tab daily after that (Patient not taking: Reported on 12/24/2016) 30 tablet 2   No current facility-administered medications for this visit.     Review of Systems:  GENERAL:  Feels bad.  Fever up to 102 two days ago, now low grade (99.5).  No chills or sweats.  No weight loss. PERFORMANCE STATUS (ECOG):  2 HEENT:  No visual changes, runny nose, sore throat, mouth sores or tenderness. Lungs: Shortness of breath with exertion.  No cough.  No hemoptysis. Cardiac:  No chest pain, palpitations, orthopnea, or PND. GI:  Diarrhea x 5 days after chemotherapy, resolution then 4-5 x yesterday.  Some abdominal cramping.  No nausea, vomiting, constipation, melena or hematochezia. GU:  No urgency, frequency, dysuria, or hematuria. Musculoskeletal:  No back pain.  No joint pain.  No muscle tenderness. Extremities:  Left leg pain and swelling. Skin:  Left upper thigh redness with new "black spots". Neuro:  Headache now and then.  No numbness or weakness, balance or coordination issues. Endocrine:  No diabetes, thyroid issues, hot flashes or night sweats. Psych:  No mood changes, depression or anxiety.  Poor sleep. Pain:  Left leg pain. Review of systems:  All other systems reviewed and found to be negative.   Physical Exam:  Blood pressure 111/70, pulse (!) 122, temperature 99.5 F (37.5 C), temperature source Tympanic, resp. rate 20, weight 269 lb 11.2 oz (122.3 kg). GENERAL:  Fatigued appearing gentleman lying flat on the exam table in no acute distress.  He has left leg pain on movement. MENTAL STATUS:  Alert and oriented to  person, place and time. HEAD:  Dark thinning hair.  Hair loss on pillow.  Normocephalic, atraumatic, face symmetric, no Cushingoid features. EYES:  Brown eyes.  Pupils equal round and reactive to light and accomodation.  No conjunctivitis or scleral icterus. ENT:  Oropharynx clear without lesion.  Tongue normal. Mucous membranes moist.  RESPIRATORY:  Clear to auscultation anteriorly without rales, wheezes or rhonchi. CARDIOVASCULAR:  Regular rate and rhythm without murmur, rub or gallop. ABDOMEN:  Soft,  non-tender, with active bowel sounds, and no hepatosplenomegaly.  No masses. SKIN:  No rashes, ulcers or lesions. EXTREMITIES: Marked left lower extremity edema.  Left thigh with erythema and increased warmth above knee.  Approximately 6 hyperpigmented areas (dark purple) areas ranging from 1 cm to 4.5 cm. At least two of the areas have small centralized pustules. Tender to palpation. LYMPH NODES: No palpable cervical, supraclavicular, axillary or inguinal adenopathy  NEUROLOGICAL: Unremarkable. PSYCH:  Appropriate.    Imaging studies: 11/28/2015:  Chest, abdomen, and pelvic CT revealed no mediastinal adenopathy or suspicious pulmonary nodules.   There were 2 prominent left periaortic retroperitoneal lymph nodes (1.7 cm and 0.9 cm) concerning for testicular nodal metastasis.  There were no abnormal lymph nodes above the renal veins.  There were postsurgical change in the left hemiscrotum and left inguinal canal.  There was a 3.3 cm soft tissue abnormality anterior to the left iliopsoas muscle which could represent a metastatic lymph node (N2 lesion).  12/17/2015:  Head MRI revealed no evidence of metastatic disease.   03/30/2016:  Chest, abdomen, and pelvic CT revealed interval resolution of left abdominal/pelvic lymphadenopathy.  There was no residual metastatic disease. 09/30/2016:  Chest, abdomen, and pelvic CT revealed no evidence of recurrent or metastatic disease.  09/10/2017:  Bilateral  lower extremity duplex revealed evidence of obstruction in the left CFV, SFJ, and proximal femoral vein.  09/10/2017:  CT pelvic venogram revealed multiple retroperitoneal masses including large mass within the left iliacus concerning for metastatic disease in the setting of prior testicular cancer.  Mass in the left iliacus caused significant narrowing of the proximal left external iliac vein. There was filling defect within the distal left external iliac vein extending into the common femoral and proximal superficial femoral veins consistent with thrombus  09/11/2017:  Chest, abdomen, and pelvic CT revealed enlarged and morphologically abnormal 4.3 x 4.9 cm left pelvic sidewall and retroperitoneal lymph nodes (measuring up to 2.9 cm) concerning for metastatic disease given history of testicular cancer. Biopsy was suggested for confirmation. Prominent left inguinal lymph nodes could be metastatic or reactive in nature.  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  There was anasarca/swelling of the left lower extremity.  There was no evidence of metastatic disease in the chest. 09/13/2017:  Head MRI revealed no evidence of metastatic disease.   Admission on 10/06/2017, Discharged on 10/07/2017  Component Date Value Ref Range Status  . WBC 10/06/2017 9.2  3.8 - 10.6 K/uL Final  . RBC 10/06/2017 2.93* 4.40 - 5.90 MIL/uL Final  . Hemoglobin 10/06/2017 8.9* 13.0 - 18.0 g/dL Final  . HCT 10/06/2017 26.1* 40.0 - 52.0 % Final  . MCV 10/06/2017 88.8  80.0 - 100.0 fL Final  . MCH 10/06/2017 30.2  26.0 - 34.0 pg Final  . MCHC 10/06/2017 34.0  32.0 - 36.0 g/dL Final  . RDW 10/06/2017 12.5  11.5 - 14.5 % Final  . Platelets 10/06/2017 81* 150 - 440 K/uL Final  . Neutrophils Relative % 10/06/2017 80  % Final  . Neutro Abs 10/06/2017 7.4* 1.4 - 6.5 K/uL Final  . Lymphocytes Relative 10/06/2017 9  % Final  . Lymphs Abs 10/06/2017 0.8* 1.0 - 3.6  K/uL Final  . Monocytes Relative 10/06/2017 11  % Final  . Monocytes Absolute 10/06/2017 1.1* 0.2 - 1.0 K/uL Final  . Eosinophils Relative 10/06/2017 0  % Final  . Eosinophils Absolute 10/06/2017 0.0  0 - 0.7 K/uL Final  .  Basophils Relative 10/06/2017 0  % Final  . Basophils Absolute 10/06/2017 0.0  0 - 0.1 K/uL Final   Performed at [facility], [address] [place], [place] [address]  . Sodium 10/06/2017 135  135 - 145 mmol/L Final  . Potassium 10/06/2017 3.3* 3.5 - 5.1 mmol/L Final  . Chloride 10/06/2017 97* 98 - 111 mmol/L Final  . CO2 10/06/2017 29  22 - 32 mmol/L Final  . Glucose, Bld 10/06/2017 110* 70 - 99 mg/dL Final  . BUN 10/06/2017 9  6 - 20 mg/dL Final  . Creatinine, Ser 10/06/2017 0.73  0.61 - 1.24 mg/dL Final  . Calcium 10/06/2017 8.7* 8.9 - 10.3 mg/dL Final  . Total Protein 10/06/2017 6.2* 6.5 - 8.1 g/dL Final  . Albumin 10/06/2017 3.2* 3.5 - 5.0 g/dL Final  . AST 10/06/2017 12* 15 - 41 U/L Final  . ALT 10/06/2017 13  0 - 44 U/L Final  . Alkaline Phosphatase 10/06/2017 55  38 - 126 U/L Final  . Total Bilirubin 10/06/2017 0.7  0.3 - 1.2 mg/dL Final  . GFR calc non Af Amer 10/06/2017 >60  >60 mL/min Final  . GFR calc Af Amer 10/06/2017 >60  >60 mL/min Final   Comment: (NOTE) The eGFR has been calculated using the CKD EPI equation. This calculation has not been validated in all clinical situations. eGFR's persistently <60 mL/min signify possible Chronic Kidney Disease.   Georgiann Hahn gap 10/06/2017 9  5 - 15 Final   Performed at [facility], [address] [place], [place] [address]  . Lactic Acid, Venous 10/06/2017 0.9  0.5 - 1.9 mmol/L Final   Performed at [facility], [address] [place], [place] [address]  . Specimen Description 10/06/2017 BLOOD RIGHT CHEST   Final  . Special Requests 10/06/2017 BOTTLES DRAWN AEROBIC AND ANAEROBIC Blood Culture adequate volume   Final  . Culture 10/06/2017    Final                   Value:NO  GROWTH 2 DAYS Performed at [facility], [address] [place], [place] [address]   . Report Status 10/06/2017 PENDING   Incomplete  . Sed Rate 10/06/2017 73* 0 - 15 mm/hr Final   Performed at [facility], [address] [place], [place] [address]  . CRP 10/06/2017 13.1* <1.0 mg/dL Final   Performed at [facility] [address] [place], [place] [address]  . Prothrombin Time 10/06/2017 24.8* 11.4 - 15.2 seconds Final  . INR 10/06/2017 2.26   Final   Performed at [facility], [address] [place], [place] [address]    Assessment:  Edgar [name] is a 27 y.o. male with recurrent testicular cancer.  He presented with a left lower extremity DVT on 09/10/2017.  He presented with stage IIB left testicular cancer s/p left radical orchiectomy on 11/26/2015.  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  Clinical/pathologic stage is T1 N1-2 S0 M0.  He received 3 cycles of  BEP (12/30/2015 - 02/24/2016).  He received OnPro Neulasta with cycle #2 and #3.  He declined evaluation for retroperitoneal lymph node dissection (RPLND).  He declined sperm banking.  Pre surgery labs on 11/20/2015 revealed an AFP of 411.9, beta-HCG 2,669, and LDH 522 (121-224).    Tumor markers have been followed:   AFP was  411.9 (0-8.3) on 11/20/2015, 11.2 on 12/19/2015, 6.3 on 12/23/2015, 1.3 on 01/27/2016, 1.8 on 02/17/2016, 1.9 on 03/30/2016, 1.4 on 05/27/2016, 1.0 on 07/29/2016, 1.4 on 10/14/2016, 3.3 on 12/24/2016, 1 on 09/10/2017, and 1 on 09/20/2017.    Beta-HCG was 2,669 (0-3) on 11/20/2015, 2 on 12/19/2015, 1 on 12/23/2015, < 1 on 01/27/2016, < 1 on 02/17/2016, < 1 on 03/30/2016, < 1 on 05/27/2016,  < 1 on 07/29/2016, < 1 on 10/14/2016, and 3 on 12/24/2016, < 5 on 09/11/2017, and < 5 on 09/20/2017.  LDH was 522 (98-192) on 11/20/2015, 179 on 12/19/2015, 160  on 12/23/2015, 202 on 01/27/2016, 156 on 02/10/2016, 161 on 02/17/2016, 172 on 03/30/2016, 178 on 05/27/2016, 150 on 07/29/2016, 152 on 10/14/2016, 145 on 12/24/2016, 1557 on 09/11/2017, and 916 on 09/20/2017.  Chest, abdomen, and pelvic CT on 09/11/2017 revealed enlarged and morphologically abnormal 4.3 x 4.9 cm left pelvic sidewall and retroperitoneal lymph nodes (measuring up to 2.9 cm).  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  There was anasarca/swelling of the left lower extremity.  There was no evidence of metastatic disease in the chest.  Head MRI on 09/13/2017 revealed no evidence of metastatic disease.  He has a left lower extremity DVT.  Bilateral lower extremity duplex on 09/10/2017 revealed evidence of obstruction in the left CFV, SFJ, and proximal femoral vein.  He is on Xarelto.  He is currently day 18 s/p TIP chemotherapy (began 09/21/2017) at [facility].  WBC is adequate.  Plate count is recovering.  He was diagnosed with cellulitis of the left lower extremity on 10/06/2017.  He is on clindamycin.  Symptomatically, he has persistent fevers, increased redness of the left thigh with new lesions (some with central pustules).  He is unable to ambulate.  Plan: 1. Discuss interim events.  Discuss diagnosis of recurrent testicular cancer presenting with initial left lower extremity thrombus.  Discuss imaging studies. 2. Discuss chemotherapy plan:  4 cycles of TIP every 3 weeks followed by re-evaluation and possible RPLD based on residual disease.  Based on published data from [place] ([place]), 70% of patients achieved a CR. 7% who achieved a CR relapsed after TIP chemotherapy. At a median follow-up time of 69 months, 63% of patients had a durable CR rate and a 2-year progression-free survival rate of 65% (51% - 79%). 3. Discuss DVT.  Currently on Xarelto,  Will need repeat left lower extremity duplex to ensure no clot  progression. 4. Discuss cellulitis.  Left leg has worsened since initiation of clindamycin.  Patient also reports new diarrhea. 5. Discuss plan for admission to the hospital for acute management of cellulitis. Discuss initiation of broad spectrum antibiotics.  Blood culture every 24 hours prn temp > 100.4.  Discuss need to resolve current infection prior to initiation of cycle #2 TIP chemotherapy. 6. Given diarrhea, Levaquin earlier this month and recent clindamycin, check stool for C diff and GI panel by PCR. 7. Pain medications as needed.  Will need follow-up of electrolytes as patient wasting potassium and magnesium post chemotherapy.  A total of (50) minutes of face-to-face time was spent with the patient with greater than 50% of that time in counseling and care-coordination.  In addition, I spoke at length with the [place], Dr. Anselm Jungling at [facility], and the patient's oncologist Dr Kalman Shan at [facility].    Honor Loh, NP  10/08/2017, 11:03 AM   I saw and evaluated the patient, participating in the key portions  of the service and reviewing pertinent diagnostic studies and records.  I reviewed the nurse practitioner's note and agree with the findings and the plan.  The assessment and plan were discussed with the patient and his mother in detail.  Multiple questions were asked by the patient and his mother and answered.  He is agreeable to admission.   Nolon Stalls, MD 10/08/2017,11:03 AM

## 2017-10-08 NOTE — Progress Notes (Signed)
Patient ID: Edgar [name], male   DOB: December 21, 1990, 27 y.o.   MRN: [id number]  CT scan images evaluated. Patient without signs of necrotizing fasciitis. Again, if area does not improve with IV, recommend to repeat ultrasound to see if DVT is progressing. Will follow with physical exam tomorrow morning.

## 2017-10-08 NOTE — H&P (Addendum)
[facility] [place] NAME: Edgar [name]    MR#:  [id number]  DATE OF BIRTH:  12-29-1990  DATE OF ADMISSION:  10/08/2017  PRIMARY CARE PHYSICIAN: Patient, No Pcp Per   REQUESTING/REFERRING PHYSICIAN: Corchoran  CHIEF COMPLAINT:  Cellulitis   HISTORY OF PRESENT ILLNESS: Edgar [name]  is a 27 y.o. male with a known history of asthma, testicular cancer, gastroesophageal reflux disease, DVT-came to emergency room 2 days ago with redness and swelling on his left leg-diagnosed for cellulitis and given clindamycin injection.  He was discharged home with oral clindamycin. He went to cancer center for his follow-up visit today and complained that his swelling has significantly got worse over last 2 days up to his thighs now with severe tenderness.  Dr. Gerrit Friends called in for direct admission for IV Abx. Pt have fever now for last 1 day.  PAST MEDICAL HISTORY:   Past Medical History:  Diagnosis Date  . Asthma    AS A CHILD-NO INHALERS  . Cancer ([place])    testicular cancer 11/2016 per pt   . GERD (gastroesophageal reflux disease)    TUMS PRN    PAST SURGICAL HISTORY:  Past Surgical History:  Procedure Laterality Date  . ANKLE FRACTURE SURGERY Right 2004  . ORCHIECTOMY Left 11/26/2015   Procedure: ORCHIECTOMY;  Surgeon: [name], MD;  Location: [facility] OR[facility];  Service: Urology;  Laterality: Left;  radical/Inguinal approach  . PERIPHERAL VASCULAR CATHETERIZATION N/A 12/16/2015   Procedure: [name] Cath Insertion;  Surgeon: [name], MD;  Location: [facility];  Service: Cardiovascular;  Laterality: N/A;  . WISDOM TOOTH EXTRACTION  2017    SOCIAL HISTORY:  Social History   Tobacco Use  . Smoking status: Current Every Day Smoker    Packs/day: 0.50    Years: 8.00    Pack years: 4.00    Types: Cigarettes  . Smokeless tobacco: Former Systems developer    Types: Snuff  Substance Use Topics  . Alcohol use: No    FAMILY HISTORY:  Family  History  Problem Relation Age of Onset  . Skin cancer Mother   . Breast cancer Maternal Grandmother   . Colon cancer Maternal Grandfather   . Diabetes Maternal Grandfather   . Emphysema Father   . Mental illness Sister        anxiety  . Stroke Paternal Grandmother   . Prostate cancer Neg Hx   . Kidney cancer Neg Hx   . Bladder Cancer Neg Hx     DRUG ALLERGIES: No Known Allergies  REVIEW OF SYSTEMS:   CONSTITUTIONAL: have fever, fatigue or weakness.  EYES: No blurred or double vision.  EARS, NOSE, AND THROAT: No tinnitus or ear pain.  RESPIRATORY: No cough, shortness of breath, wheezing or hemoptysis.  CARDIOVASCULAR: No chest pain, orthopnea, edema.  GASTROINTESTINAL: No nausea, vomiting, diarrhea or abdominal pain.  GENITOURINARY: No dysuria, hematuria.  ENDOCRINE: No polyuria, nocturia,  HEMATOLOGY: No anemia, easy bruising or bleeding SKIN: No rash or lesion. MUSCULOSKELETAL: No joint pain or arthritis.  Have pain in left thigh. NEUROLOGIC: No tingling, numbness, weakness.  PSYCHIATRY: No anxiety or depression.   MEDICATIONS AT HOME:  Prior to Admission medications   Medication Sig Start Date End Date Taking? Authorizing Provider  acetaminophen (TYLENOL) 500 MG tablet Take by mouth.    [provider]  busPIRone (BUSPAR) 10 MG tablet TAKE 1/2 - 1 AND 1/2 (5-15MG ) BY MOUTH 3 TIMES A DAY Patient not taking:  Reported on 10/08/2017 01/04/17   Park Liter P, DO  clindamycin (CLEOCIN) 300 MG capsule Take 1 capsule (300 mg total) by mouth 3 (three) times daily for 10 days. 10/07/17 10/17/17  Arta Silence, MD  dexamethasone (DECADRON) 4 MG tablet Take 2 tablets (8mg  total) by mouth daily for 3 days after chemotherapy to prevent nausea 09/26/17   [provider]  DULoxetine (CYMBALTA) 60 MG capsule Take 1 tablet by mouth daily. 10/05/17 10/05/18  [provider]  HYDROcodone-acetaminophen (NORCO/VICODIN) 5-325 MG tablet Take 1 tablet by mouth every 6  (six) hours as needed for up to 5 days for moderate pain. 10/06/17 10/11/17  Arta Silence, MD  KLOR-CON M20 20 MEQ tablet Take 1 tablet by mouth 2 (two) times daily. 10/04/17   [provider]  levofloxacin (LEVAQUIN) 500 MG tablet Take 1 tablet by mouth daily on days 7-13 of every chemotherapy cycle to prevent infection 09/26/17   [provider]  magnesium oxide (MAG-OX) 400 MG tablet Take 1 tablet by mouth daily. 10/04/17   [provider]  nicotine (NICODERM CQ - DOSED IN MG/24 HR) 7 mg/24hr patch Place onto the skin. 09/15/17   [provider]  ondansetron (ZOFRAN-ODT) 4 MG disintegrating tablet Take 2 tablets by mouth every 8 (eight) hours as needed. 09/26/17   [provider]  Oxycodone HCl 10 MG TABS  09/26/17   [provider]  polyethylene glycol (MIRALAX / GLYCOLAX) packet Take by mouth. 09/14/17 10/14/17  [provider]  prochlorperazine (COMPAZINE) 10 MG tablet Take 1 tablet by mouth every 6 (six) hours as needed. 09/26/17   [provider]  sertraline (ZOLOFT) 50 MG tablet 1/2 tab a day for 1 week, 1 tab daily after that Patient not taking: Reported on 12/24/2016 11/20/16   Park Liter P, DO  XARELTO 20 MG TABS tablet Take 1 tablet by mouth daily. 09/26/17   [provider]      PHYSICAL EXAMINATION:   VITAL SIGNS: Blood pressure 125/73, pulse (!) 106, temperature (!) 102.3 F (39.1 C), temperature source Oral, resp. rate 16, height 6\' 3"  (1.905 m), weight 121.6 kg (268 lb 1.6 oz), SpO2 94 %.  GENERAL:  27 y.o.-year-old patient lying in the bed with no acute distress.  EYES: Pupils equal, round, reactive to light and accommodation. No scleral icterus. Extraocular muscles intact.  HEENT: Head atraumatic, normocephalic. Oropharynx and nasopharynx clear.  NECK:  Supple, no jugular venous distention. No thyroid enlargement, no tenderness.  LUNGS: Normal breath sounds bilaterally, no wheezing, rales,rhonchi or  crepitation. No use of accessory muscles of respiration.  CARDIOVASCULAR: S1, S2 normal. No murmurs, rubs, or gallops.  ABDOMEN: Soft, nontender, nondistended. Bowel sounds present. No organomegaly or mass.  EXTREMITIES: Left leg and thigh are swollen.  Left thigh has at least 5-6 spots on the medial and posterior side each of her quarter size, dark brown to black color with soft top and significant tenderness on gentle touch.  NEUROLOGIC: Cranial nerves II through XII are intact. Muscle strength 5/5 in all extremities. Sensation intact. Gait not checked.  PSYCHIATRIC: The patient is alert and oriented x 3.  SKIN: No obvious rash, lesion, or ulcer.   LABORATORY PANEL:   CBC Recent Labs  Lab 10/06/17 2222  WBC 9.2  HGB 8.9*  HCT 26.1*  PLT 81*  MCV 88.8  MCH 30.2  MCHC 34.0  RDW 12.5  LYMPHSABS 0.8*  MONOABS 1.1*  EOSABS 0.0  BASOSABS 0.0   ------------------------------------------------------------------------------------------------------------------  Chemistries  Recent Labs  Lab 10/06/17 2222  NA 135  K 3.3*  CL 97*  CO2 29  GLUCOSE 110*  BUN 9  CREATININE 0.73  CALCIUM 8.7*  AST 12*  ALT 13  ALKPHOS 55  BILITOT 0.7   ------------------------------------------------------------------------------------------------------------------ estimated creatinine clearance is 191.4 mL/min (by C-G formula based on SCr of 0.73 mg/dL). ------------------------------------------------------------------------------------------------------------------ No results for input(s): TSH, T4TOTAL, T3FREE, THYROIDAB in the last 72 hours.  Invalid input(s): FREET3   Coagulation profile Recent Labs  Lab 10/06/17 2222  INR 2.26   ------------------------------------------------------------------------------------------------------------------- No results for input(s): DDIMER in the last 72  hours. -------------------------------------------------------------------------------------------------------------------  Cardiac Enzymes No results for input(s): CKMB, TROPONINI, MYOGLOBIN in the last 168 hours.  Invalid input(s): CK ------------------------------------------------------------------------------------------------------------------ Invalid input(s): POCBNP  ---------------------------------------------------------------------------------------------------------------  Urinalysis    Component Value Date/Time   APPEARANCEUR Clear 11/20/2015 1331   GLUCOSEU Negative 11/20/2015 1331   BILIRUBINUR Negative 11/20/2015 1331   PROTEINUR Negative 11/20/2015 1331   NITRITE Negative 11/20/2015 1331   LEUKOCYTESUR Negative 11/20/2015 1331     RADIOLOGY: Dg Chest Portable 1 View  Result Date: 10/06/2017 CLINICAL DATA:  Initial evaluation for acute fever. History of asthma. EXAM: PORTABLE CHEST 1 VIEW COMPARISON:  Prior CT from 09/30/2016 FINDINGS: Right-sided Port-A-Cath in place with tip overlying the right atrium. Transverse heart size at the upper limits of normal for AP technique. Mediastinal silhouette within normal limits. Lungs mildly hypoinflated. Mild scattered peribronchial thickening, greatest within the lung bases. No focal infiltrates. No pulmonary edema or pleural effusion. No pneumothorax. Remotely healed right-sided rib fracture noted. No acute osseous abnormality. IMPRESSION: Mild scattered peribronchial thickening, suspected to be related history of asthma. Acute bronchiolitis could be considered in the correct clinical setting. No focal infiltrates to suggest pneumonia. Electronically Signed   By: Jeannine Boga M.D.   On: 10/06/2017 22:30    EKG: Orders placed or performed in visit on 02/27/07  . EKG 12-Lead    IMPRESSION AND PLAN:  *Cellulitis on leg and thigh Rapidly worsening cellulitis up to his thigh. I suspect necrotizing fasciitis, I called  general surgery for stat consult.  Surgeon was in the OR and I spoke to the nurse around 4 PM and informed her about my possible diagnosis and need for the consult. I will give him cefepime plus vancomycin plus clindamycin IV for now. Tylenol as needed for fever.  Oxycodone oral for pain control and morphine IV for breakthrough pain. I will hold his Xarelto and keep on heparin IV drip, if he need to undergo surgery.  * sepsis   As above  * testicular cancer   Follows at cancer center  *Active smoking Counseled to quit smoking for 4 minutes and offered nicotine patch.    Labs are still pending will review.  All the records are reviewed and case discussed with ED provider. Management plans discussed with the patient, family and they are in agreement.  CODE STATUS: full.   TOTAL TIME TAKING CARE OF THIS PATIENT: 50 minutes.    Vaughan Basta M.D on 10/08/2017   Between 7am to 6pm - Pager - [phone]  After 6pm go to [email or web address] - password [email or web address] [facility]  Office  [phone]  CC: Primary care physician; Patient, No Pcp Per   Note: This dictation was prepared with Dragon dictation along with smaller phrase technology. Any transcriptional errors that result from this process are unintentional.

## 2017-10-08 NOTE — Progress Notes (Signed)
Here for follow up.  Few days before  June 28 th noted to have swelling of left leg. Progressed   To reddness and warm to touch.  Seemed to improve and has flared up again.  Above left knee bright red ,warm to touch. Pt has had fever 102  2 days ago  ( today 99.5 ) pt in pain ( took pain med this am ) now new areas  Of identifiable round raised brownish/red  areas x 4  approx quarter to half dollar size-new yesterday. Went to ER Wed 10/06/17 and given fluids , iv abx and put on abx now .   Entire left leg swollen - 1 plus in ankle.  Pt here w mother.   Pt lying down w left leg elevated   Dr Mike Gip informed

## 2017-10-08 NOTE — Consult Note (Signed)
SURGICAL CONSULTATION NOTE   HISTORY OF PRESENT ILLNESS (HPI):  27 y.o. male admitted to the hospital due to left lower extremity cellulitis. The patient refers that he came to the ED 2 days ago with a small area of redness on the skin and he was discharged with Clindamycin. The patient went to the Oncologist as a routing appointment and he was found with worsening of the redness. He was admitted to the hospital. The patient refers that the red area has beein increasing in size and darker area of discoloration has been growing. Refer pain on thigh. Pain radiates to distal left leg. Pain is aggravated with palpation and with extension of the knee. Nothing has improved the pain. Patient denies loss of sensation. Refers is able to walk with pain on the thigh. Denies fever or chills.   Surgery is consulted by Dr. Anselm Jungling in this context for evaluation and management of suspected necrotizing fasciitis.  PAST MEDICAL HISTORY (PMH):  Past Medical History:  Diagnosis Date  . Asthma    AS A CHILD-NO INHALERS  . Cancer ([place])    testicular cancer 11/2016 per pt   . GERD (gastroesophageal reflux disease)    TUMS PRN     PAST SURGICAL HISTORY ([place]):  Past Surgical History:  Procedure Laterality Date  . ANKLE FRACTURE SURGERY Right 2004  . ORCHIECTOMY Left 11/26/2015   Procedure: ORCHIECTOMY;  Surgeon: [name], MD;  Location: [facility];  Service: Urology;  Laterality: Left;  radical/Inguinal approach  . PERIPHERAL VASCULAR CATHETERIZATION N/A 12/16/2015   Procedure: [name] Cath Insertion;  Surgeon: [name], MD;  Location: [facility];  Service: Cardiovascular;  Laterality: N/A;  . WISDOM TOOTH EXTRACTION  2017     MEDICATIONS:  Prior to Admission medications   Medication Sig Start Date End Date Taking? Authorizing Provider  acetaminophen (TYLENOL) 500 MG tablet Take by mouth.    [provider]  busPIRone (BUSPAR) 10 MG tablet TAKE 1/2 - 1 AND 1/2 (5-15MG ) BY MOUTH 3  TIMES A DAY Patient not taking: Reported on 10/08/2017 01/04/17   Park Liter P, DO  clindamycin (CLEOCIN) 300 MG capsule Take 1 capsule (300 mg total) by mouth 3 (three) times daily for 10 days. 10/07/17 10/17/17  Arta Silence, MD  dexamethasone (DECADRON) 4 MG tablet Take 2 tablets (8mg  total) by mouth daily for 3 days after chemotherapy to prevent nausea 09/26/17   [provider]  DULoxetine (CYMBALTA) 60 MG capsule Take 1 tablet by mouth daily. 10/05/17 10/05/18  [provider]  HYDROcodone-acetaminophen (NORCO/VICODIN) 5-325 MG tablet Take 1 tablet by mouth every 6 (six) hours as needed for up to 5 days for moderate pain. 10/06/17 10/11/17  Arta Silence, MD  KLOR-CON M20 20 MEQ tablet Take 1 tablet by mouth 2 (two) times daily. 10/04/17   [provider]  levofloxacin (LEVAQUIN) 500 MG tablet Take 1 tablet by mouth daily on days 7-13 of every chemotherapy cycle to prevent infection 09/26/17   [provider]  magnesium oxide (MAG-OX) 400 MG tablet Take 1 tablet by mouth daily. 10/04/17   [provider]  nicotine (NICODERM CQ - DOSED IN MG/24 HR) 7 mg/24hr patch Place onto the skin. 09/15/17   [provider]  ondansetron (ZOFRAN-ODT) 4 MG disintegrating tablet Take 2 tablets by mouth every 8 (eight) hours as needed. 09/26/17   [provider]  Oxycodone HCl 10 MG TABS  09/26/17   [provider]  polyethylene glycol (MIRALAX / GLYCOLAX)  packet Take by mouth. 09/14/17 10/14/17  [provider]  prochlorperazine (COMPAZINE) 10 MG tablet Take 1 tablet by mouth every 6 (six) hours as needed. 09/26/17   [provider]  sertraline (ZOLOFT) 50 MG tablet 1/2 tab a day for 1 week, 1 tab daily after that Patient not taking: Reported on 12/24/2016 11/20/16   Park Liter P, DO  XARELTO 20 MG TABS tablet Take 1 tablet by mouth daily. 09/26/17   [provider]     ALLERGIES:  No Known Allergies   SOCIAL  HISTORY:  Social History   Socioeconomic History  . Marital status: Single    Spouse name: Not on file  . Number of children: Not on file  . Years of education: Not on file  . Highest education level: Not on file  Occupational History  . Not on file  Social Needs  . Financial resource strain: Not on file  . Food insecurity:    Worry: Not on file    Inability: Not on file  . Transportation needs:    Medical: Not on file    Non-medical: Not on file  Tobacco Use  . Smoking status: Current Every Day Smoker    Packs/day: 0.50    Years: 8.00    Pack years: 4.00    Types: Cigarettes  . Smokeless tobacco: Former Systems developer    Types: Snuff  Substance and Sexual Activity  . Alcohol use: No  . Drug use: Yes    Types: Marijuana  . Sexual activity: Yes  Lifestyle  . Physical activity:    Days per week: Not on file    Minutes per session: Not on file  . Stress: Not on file  Relationships  . Social connections:    Talks on phone: Not on file    Gets together: Not on file    Attends religious service: Not on file    Active member of club or organization: Not on file    Attends meetings of clubs or organizations: Not on file    Relationship status: Not on file  . Intimate partner violence:    Fear of current or ex partner: Not on file    Emotionally abused: Not on file    Physically abused: Not on file    Forced sexual activity: Not on file  Other Topics Concern  . Not on file  Social History Narrative  . Not on file    The patient currently resides (home / rehab facility / nursing home): Home The patient normally is (ambulatory / bedbound): Ambulatory   FAMILY HISTORY:  Family History  Problem Relation Age of Onset  . Skin cancer Mother   . Breast cancer Maternal Grandmother   . Colon cancer Maternal Grandfather   . Diabetes Maternal Grandfather   . Emphysema Father   . Mental illness Sister        anxiety  . Stroke Paternal Grandmother   . Prostate cancer Neg Hx   .  Kidney cancer Neg Hx   . Bladder Cancer Neg Hx      REVIEW OF SYSTEMS:  Constitutional: denies weight loss, chills, or sweats. Positive for fever.  Eyes: denies any other vision changes, history of eye injury  ENT: denies sore throat, hearing problems  Respiratory: denies shortness of breath, wheezing  Cardiovascular: denies chest pain, palpitations  Gastrointestinal: denies abdominal pain, N/V, or diarrhea Genitourinary: denies burning with urination or urinary frequency Musculoskeletal: denies any other joint pains or cramps. Positive for  left thigh pain. Skin: denies any other rashes. Positive for cellulitis and discoloration of the left thigh.  Neurological: denies any other headache, dizziness, weakness  Psychiatric: denies any other depression, anxiety   All other review of systems were negative   VITAL SIGNS:  Temp:  [99.5 F (37.5 C)-102.3 F (39.1 C)] 99.7 F (37.6 C) (07/26 1936) Pulse Rate:  [106-122] 106 (07/26 1538) Resp:  [16-20] 16 (07/26 1538) BP: (111-125)/(70-73) 125/73 (07/26 1538) SpO2:  [94 %] 94 % (07/26 1538) Weight:  [121.6 kg (268 lb 1.6 oz)-122.3 kg (269 lb 11.2 oz)] 121.6 kg (268 lb 1.6 oz) (07/26 1538)     Height: 6\' 3"  (190.5 cm) Weight: 121.6 kg (268 lb 1.6 oz) BMI (Calculated): 33.51   INTAKE/OUTPUT:  This shift: No intake/output data recorded.  Last 2 shifts: @IOLAST2SHIFTS @   PHYSICAL EXAM:  Constitutional:  -- Normal body habitus  -- Awake, alert, and oriented x3  Eyes:  -- Pupils equally round and reactive to light  -- No scleral icterus  Ear, nose, and throat:  -- No jugular venous distension  Pulmonary:  -- No crackles  -- Equal breath sounds bilaterally -- Breathing non-labored at rest Cardiovascular:  -- S1, S2 present  -- No pericardial rubs Gastrointestinal:  -- Abdomen soft, nontender, non-distended, no guarding or rebound tenderness -- No abdominal masses appreciated, pulsatile or otherwise  Musculoskeletal and  Integumentary:  -- left thigh erythema, warm, tender to palpation. No crepitus on palpation. There are multiple spot discoloration Neurologic:  -- Motor function: intact and symmetric -- Sensation: intact and symmetric   Labs:  CBC Latest Ref Rng & Units 10/08/2017 10/06/2017 12/24/2016  WBC 3.8 - 10.6 K/uL 12.1(H) 9.2 6.3  Hemoglobin 13.0 - 18.0 g/dL 8.9(L) 8.9(L) 13.8  Hematocrit 40.0 - 52.0 % 26.3(L) 26.1(L) 42.0  Platelets 150 - 440 K/uL 137(L) 81(L) 301   CMP Latest Ref Rng & Units 10/08/2017 10/06/2017 12/24/2016  Glucose 70 - 99 mg/dL 117(H) 110(H) 114(H)  BUN 6 - 20 mg/dL 10 9 16   Creatinine 0.61 - 1.24 mg/dL 0.95 0.73 0.85  Sodium 135 - 145 mmol/L 133(L) 135 138  Potassium 3.5 - 5.1 mmol/L 3.6 3.3(L) 4.2  Chloride 98 - 111 mmol/L 97(L) 97(L) 102  CO2 22 - 32 mmol/L 28 29 26   Calcium 8.9 - 10.3 mg/dL 8.5(L) 8.7(L) 9.2  Total Protein 6.5 - 8.1 g/dL 6.4(L) 6.2(L) 7.2  Total Bilirubin 0.3 - 1.2 mg/dL 0.6 0.7 0.5  Alkaline Phos 38 - 126 U/L 54 55 51  AST 15 - 41 U/L 14(L) 12(L) 23  ALT 0 - 44 U/L 12 13 22     Imaging studies: images personally reviewed. No air seen on subcutaneous tissue.   EXAM: LEFT FEMUR 2 VIEWS  COMPARISON:  None.  FINDINGS: There is no evidence of fracture or other focal bone lesions. Diffuse soft tissue swelling. No subcutaneous emphysema.  IMPRESSION: Diffuse soft tissue swelling.  No subcutaneous emphysema.   Electronically Signed   By: Titus Dubin M.D.   On: 10/08/2017 17:11  Assessment/Plan:  27 y.o. male with left leg cellulitis that was not responding to oral antibiotic, complicated by pertinent comorbidities including DVT on anticoagulation, bronchia asthma, testicular cancer, and GERD.  Patient with a complex case of non improving left thigh cellulitis. The patient did not responded to Clindamycin which is usually an adequate cover for most common organisms. The patient has a large thigh and very tender to palpation that make  difficult the physical exam  and difficult to identify crepitus. White blood cell in increasing trend. Since necrotizing fasciitis is an urgent condition, I will not like to wait to do serial physical exam without a deeper imaging study like CT scan that could show me if there is subcutaneous emphysema. Femur xray does not show subcutaneous emphysema. If CT scan is positive patient will need debridement of deep fascia.  If patient does not has fasciitis on physical exam I will recommend ultrasound of the left lower extremity for re evaluation of the DVT. Needs to rule out that the DVT is not progressing.  Agree with current Abx therapy. Will follow close.y.   Arnold Long, MD

## 2017-10-08 NOTE — Progress Notes (Addendum)
ANTICOAGULATION CONSULT NOTE - Initial Consult  Pharmacy Consult for Heparin drip Indication: DVT  No Known Allergies  Patient Measurements: Height: 6\' 3"  (190.5 cm) Weight: 268 lb 1.6 oz (121.6 kg) IBW/kg (Calculated) : 84.5 Heparin Dosing Weight: 110 kg  Vital Signs: Temp: 102.3 F (39.1 C) (07/26 1538) Temp Source: Oral (07/26 1538) BP: 125/73 (07/26 1538) Pulse Rate: 106 (07/26 1538)  Labs: Recent Labs    10/06/17 2222 10/08/17 1611  HGB 8.9*  --   HCT 26.1*  --   PLT 81*  --   APTT  --  44*  LABPROT 24.8* 20.9*  INR 2.26 1.82  HEPARINUNFRC  --  3.16*  CREATININE 0.73 0.95    Estimated Creatinine Clearance: 161.1 mL/min (by C-G formula based on SCr of 0.95 mg/dL).   Medical History: Past Medical History:  Diagnosis Date  . Asthma    AS A CHILD-NO INHALERS  . Cancer ([place])    testicular cancer 11/2016 per pt   . GERD (gastroesophageal reflux disease)    TUMS PRN    Medications:  Patient was taking Xarelto prior to admission for DVT. Last dose was on 10/07/17 but note states that patient was unable to keep down.  Assessment: Patient is a 27yo male admitted for LE cellulitis. Pharmacy consulted for transition to Heparin infusion. Baseline Heparin level is elevated at 3.16 so will need to use aPTT to guide therapy.  Goal of Therapy:  APTT 66 - 102 seconds Heparin level 0.3-0.7 units/ml  Monitor platelets by anticoagulation protocol: Yes   Plan:  Give 5000 units bolus x 1 Start heparin infusion at 1300 units/hr Check anti-Xa level in 6 hours and daily while on heparin Continue to monitor H&H and platelets  Paulina Fusi, PharmD, BCPS 10/08/2017 6:42 PM

## 2017-10-09 ENCOUNTER — Inpatient Hospital Stay: Payer: BLUE CROSS/BLUE SHIELD

## 2017-10-09 DIAGNOSIS — D696 Thrombocytopenia, unspecified: Secondary | ICD-10-CM

## 2017-10-09 DIAGNOSIS — I824Y2 Acute embolism and thrombosis of unspecified deep veins of left proximal lower extremity: Secondary | ICD-10-CM | POA: Insufficient documentation

## 2017-10-09 DIAGNOSIS — D709 Neutropenia, unspecified: Secondary | ICD-10-CM

## 2017-10-09 DIAGNOSIS — N2889 Other specified disorders of kidney and ureter: Secondary | ICD-10-CM

## 2017-10-09 DIAGNOSIS — C8335 Diffuse large B-cell lymphoma, lymph nodes of inguinal region and lower limb: Secondary | ICD-10-CM

## 2017-10-09 DIAGNOSIS — R197 Diarrhea, unspecified: Secondary | ICD-10-CM | POA: Insufficient documentation

## 2017-10-09 DIAGNOSIS — Z7189 Other specified counseling: Secondary | ICD-10-CM | POA: Insufficient documentation

## 2017-10-09 DIAGNOSIS — D649 Anemia, unspecified: Secondary | ICD-10-CM

## 2017-10-09 DIAGNOSIS — Z9221 Personal history of antineoplastic chemotherapy: Secondary | ICD-10-CM

## 2017-10-09 LAB — CBC
HCT: 26.3 % — ABNORMAL LOW (ref 40.0–52.0)
Hemoglobin: 8.9 g/dL — ABNORMAL LOW (ref 13.0–18.0)
MCH: 30.3 pg (ref 26.0–34.0)
MCHC: 33.9 g/dL (ref 32.0–36.0)
MCV: 89.4 fL (ref 80.0–100.0)
PLATELETS: 170 10*3/uL (ref 150–440)
RBC: 2.94 MIL/uL — ABNORMAL LOW (ref 4.40–5.90)
RDW: 12.5 % (ref 11.5–14.5)
WBC: 10.9 10*3/uL — AB (ref 3.8–10.6)

## 2017-10-09 LAB — BASIC METABOLIC PANEL
Anion gap: 6 (ref 5–15)
BUN: 10 mg/dL (ref 6–20)
CHLORIDE: 101 mmol/L (ref 98–111)
CO2: 29 mmol/L (ref 22–32)
CREATININE: 0.92 mg/dL (ref 0.61–1.24)
Calcium: 8.1 mg/dL — ABNORMAL LOW (ref 8.9–10.3)
GFR calc Af Amer: >60 mL/min (ref >60)
GFR calc non Af Amer: >60 mL/min (ref >60)
Glucose, Bld: 100 mg/dL — ABNORMAL HIGH (ref 70–99)
Potassium: 3.6 mmol/L (ref 3.5–5.1)
SODIUM: 136 mmol/L (ref 135–145)

## 2017-10-09 LAB — HEPARIN LEVEL (UNFRACTIONATED): Heparin Unfractionated: 0.4 IU/mL (ref 0.30–0.70)

## 2017-10-09 LAB — APTT
APTT: 56 s — AB (ref 24–36)
aPTT: 48 seconds — ABNORMAL HIGH (ref 24–36)

## 2017-10-09 MED ORDER — HEPARIN BOLUS VIA INFUSION
1650.0000 [IU] | Freq: Once | INTRAVENOUS | Status: AC
  Administered 2017-10-09: 1650 [IU] via INTRAVENOUS
  Filled 2017-10-09: qty 1650

## 2017-10-09 MED ORDER — HEPARIN BOLUS VIA INFUSION
1500.0000 [IU] | Freq: Once | INTRAVENOUS | Status: AC
  Administered 2017-10-09: 1500 [IU] via INTRAVENOUS
  Filled 2017-10-09: qty 1500

## 2017-10-09 NOTE — Progress Notes (Signed)
ANTICOAGULATION CONSULT NOTE - Initial Consult  Pharmacy Consult for Heparin drip Indication: DVT  No Known Allergies  Patient Measurements: Height: 6\' 3"  (190.5 cm) Weight: 268 lb 1.6 oz (121.6 kg) IBW/kg (Calculated) : 84.5 Heparin Dosing Weight: 110 kg  Vital Signs: Temp: 99.8 F (37.7 C) (07/27 1440) Temp Source: Oral (07/27 1440) BP: 130/82 (07/27 1440) Pulse Rate: 96 (07/27 1440)  Labs: Recent Labs    10/06/17 2222 10/08/17 1611 10/09/17 0728 10/09/17 1536  HGB 8.9* 8.9* 8.9*  --   HCT 26.1* 26.3* 26.3*  --   PLT 81* 137* 170  --   APTT  --  44* 48* 56*  LABPROT 24.8* 20.9*  --   --   INR 2.26 1.82  --   --   HEPARINUNFRC  --  3.16* 0.40  --   CREATININE 0.73 0.95 0.92  --     Estimated Creatinine Clearance: 166.4 mL/min (by C-G formula based on SCr of 0.92 mg/dL).   Medical History: Past Medical History:  Diagnosis Date  . Asthma    AS A CHILD-NO INHALERS  . Cancer ([place])    testicular cancer 11/2016 per pt   . GERD (gastroesophageal reflux disease)    TUMS PRN    Medications:  Patient was taking Xarelto prior to admission for DVT. Last dose was on 10/07/17 but note states that patient was unable to keep down.  Assessment: Patient is a 27yo male admitted for LE cellulitis. Pharmacy consulted for transition to Heparin infusion. Baseline Heparin level is elevated at 3.16 so will need to use aPTT to guide therapy  Heparin level 0.40; however APTT 48   Goal of Therapy:  APTT 66 - 102 seconds Heparin level 0.3-0.7 units/ml  Monitor platelets by anticoagulation protocol: Yes   Plan:  0727 1600 aPTT 56. Level is subtherapeutic. Will order 1500 unit bolus and increase heparin drip to 1900 units/hr. Recheck aPTT and HL in 6 hours.   Pernell Dupre, PharmD, BCPS Clinical Pharmacist 10/09/2017 5:25 PM

## 2017-10-09 NOTE — Progress Notes (Addendum)
ANTICOAGULATION CONSULT NOTE - Initial Consult  Pharmacy Consult for Heparin drip Indication: DVT  No Known Allergies  Patient Measurements: Height: 6\' 3"  (190.5 cm) Weight: 268 lb 1.6 oz (121.6 kg) IBW/kg (Calculated) : 84.5 Heparin Dosing Weight: 110 kg  Vital Signs: Temp: 98.9 F (37.2 C) (07/27 0539) Temp Source: Oral (07/27 0539) BP: 123/70 (07/27 0539) Pulse Rate: 97 (07/27 0539)  Labs: Recent Labs    10/06/17 2222 10/08/17 1611 10/09/17 0728  HGB 8.9* 8.9* 8.9*  HCT 26.1* 26.3* 26.3*  PLT 81* 137* 170  APTT  --  44* 48*  LABPROT 24.8* 20.9*  --   INR 2.26 1.82  --   HEPARINUNFRC  --  3.16* 0.40  CREATININE 0.73 0.95 0.92    Estimated Creatinine Clearance: 166.4 mL/min (by C-G formula based on SCr of 0.92 mg/dL).   Medical History: Past Medical History:  Diagnosis Date  . Asthma    AS A CHILD-NO INHALERS  . Cancer ([place])    testicular cancer 11/2016 per pt   . GERD (gastroesophageal reflux disease)    TUMS PRN    Medications:  Patient was taking Xarelto prior to admission for DVT. Last dose was on 10/07/17 but note states that patient was unable to keep down.  Assessment: Patient is a 27yo male admitted for LE cellulitis. Pharmacy consulted for transition to Heparin infusion. Baseline Heparin level is elevated at 3.16 so will need to use aPTT to guide therapy  Heparin level 0.40; however APTT 48   Goal of Therapy:  APTT 66 - 102 seconds Heparin level 0.3-0.7 units/ml  Monitor platelets by anticoagulation protocol: Yes   Plan:  Will give Heparin bolus of 1650 units x 1 and will increase heparin rate to 1700 units/hr. Will recheck APTT level @ 1500. Heparin level daily.   Larene Beach, PharmD  10/09/2017 8:58 AM

## 2017-10-09 NOTE — Progress Notes (Signed)
[facility] NAME: Barry Faircloth    MR#:  [id number]  DATE OF BIRTH:  Dec 29, 1990  SUBJECTIVE:  CHIEF COMPLAINT:  No chief complaint on file. Came with worsening swelling and pain on left thigh.  Admitted for cellulitis with failure of outpatient oral treatment.  REVIEW OF SYSTEMS:  CONSTITUTIONAL: No fever, fatigue or weakness.  EYES: No blurred or double vision.  EARS, NOSE, AND THROAT: No tinnitus or ear pain.  RESPIRATORY: No cough, shortness of breath, wheezing or hemoptysis.  CARDIOVASCULAR: No chest pain, orthopnea, edema.  GASTROINTESTINAL: No nausea, vomiting, diarrhea or abdominal pain.  GENITOURINARY: No dysuria, hematuria.  ENDOCRINE: No polyuria, nocturia,  HEMATOLOGY: No anemia, easy bruising or bleeding SKIN: No rash or lesion. MUSCULOSKELETAL: No joint pain or arthritis.  Left thigh and leg swelling. NEUROLOGIC: No tingling, numbness, weakness.  PSYCHIATRY: No anxiety or depression.   ROS  DRUG ALLERGIES:  No Known Allergies  VITALS:  Blood pressure 130/82, pulse 96, temperature 99.8 F (37.7 C), temperature source Oral, resp. rate 17, height 6\' 3"  (1.905 m), weight 121.6 kg (268 lb 1.6 oz), SpO2 97 %.  PHYSICAL EXAMINATION:   GENERAL:  27 y.o.-year-old patient lying in the bed with no acute distress.  EYES: Pupils equal, round, reactive to light and accommodation. No scleral icterus. Extraocular muscles intact.  HEENT: Head atraumatic, normocephalic. Oropharynx and nasopharynx clear.  NECK:  Supple, no jugular venous distention. No thyroid enlargement, no tenderness.  LUNGS: Normal breath sounds bilaterally, no wheezing, rales,rhonchi or crepitation. No use of accessory muscles of respiration.  CARDIOVASCULAR: S1, S2 normal. No murmurs, rubs, or gallops.  ABDOMEN: Soft, nontender, nondistended. Bowel sounds present. No organomegaly or mass.  EXTREMITIES: Left leg and thigh are swollen.  Left thigh has at  least 5-6 spots on the medial and posterior side each of her quarter size, dark brown to black color with soft top and significant tenderness on gentle touch.  NEUROLOGIC: Cranial nerves II through XII are intact. Muscle strength 5/5 in all extremities. Sensation intact. Gait not checked.  PSYCHIATRIC: The patient is alert and oriented x 3.  SKIN: No obvious rash, lesion, or ulcer.     Physical Exam LABORATORY PANEL:   CBC Recent Labs  Lab 10/09/17 0728  WBC 10.9*  HGB 8.9*  HCT 26.3*  PLT 170   ------------------------------------------------------------------------------------------------------------------  Chemistries  Recent Labs  Lab 10/08/17 1611 10/09/17 0728  NA 133* 136  K 3.6 3.6  CL 97* 101  CO2 28 29  GLUCOSE 117* 100*  BUN 10 10  CREATININE 0.95 0.92  CALCIUM 8.5* 8.1*  AST 14*  --   ALT 12  --   ALKPHOS 54  --   BILITOT 0.6  --    ------------------------------------------------------------------------------------------------------------------  Cardiac Enzymes No results for input(s): TROPONINI in the last 168 hours. ------------------------------------------------------------------------------------------------------------------  RADIOLOGY:  Ct Femur Left W Contrast  Result Date: 10/08/2017 CLINICAL DATA:  Left lower extremity cellulitis and possible necrotizing fasciitis. EXAM: CT OF THE LOWER RIGHT EXTREMITY WITH CONTRAST TECHNIQUE: Multidetector CT imaging of the lower right extremity was performed according to the standard protocol following intravenous contrast administration. COMPARISON:  None. CONTRAST:  115mL ISOVUE-300 IOPAMIDOL (ISOVUE-300) INJECTION 61% FINDINGS: Mild inflammatory stranding of the subcutaneous tissues of the left thigh is noted anteriorly and medially suggesting cellulitis. No definite abscess or fluid collection is noted. No definite bony abnormality or fracture is noted involving the visualized bone. 3.7 cm left external  iliac  lymph node is noted consistent with metastatic disease. Probable filling defect is seen in the left common femoral and external iliac vein concerning for thrombus. Potentially this may extend into the left superficial femoral vein. IMPRESSION: Mild inflammatory stranding of the subcutaneous tissues of the left thigh is noted anteriorly and medially suggesting cellulitis or possibly edema. No abscess or fluid collection is noted. Filling defect is noted in the visualized portion of the left external iliac vein and common femoral vein suggesting deep venous thrombosis. Potentially this may extended to the left superficial femoral vein and further evaluation with Doppler ultrasound is recommended. These results will be called to the ordering clinician or representative by the Radiologist Assistant, and communication documented in the PACS or zVision Dashboard. 3.7 cm left external iliac lymph node is noted consistent with metastatic disease. Electronically Signed   By: Marijo Conception, M.D.   On: 10/08/2017 22:24   Dg Femur Min 2 Views Left  Result Date: 10/08/2017 CLINICAL DATA:  Fever with left leg swelling. EXAM: LEFT FEMUR 2 VIEWS COMPARISON:  None. FINDINGS: There is no evidence of fracture or other focal bone lesions. Diffuse soft tissue swelling. No subcutaneous emphysema. IMPRESSION: Diffuse soft tissue swelling.  No subcutaneous emphysema. Electronically Signed   By: Titus Dubin M.D.   On: 10/08/2017 17:11    ASSESSMENT AND PLAN:   Principal Problem:   Cellulitis  *Cellulitis on leg and thigh Rapidly worsening cellulitis up to his thigh. Appreciated surgical help, no necrotizing fasciitis, no acute surgical intervention needed. I will give him cefepime plus vancomycin plus clindamycin IV for now. Tylenol as needed for fever.  Oxycodone oral for pain control and morphine IV for breakthrough pain. I will hold his Xarelto and keep on heparin IV drip, if he need to undergo surgery.  *  sepsis   As above  *DVT on left leg Continue heparin drip for now. As surgery recommended, will get DVTs Doppler study to check for progression.  * testicular cancer   Follows at cancer center  *Active smoking Counseled to quit smoking for 4 minutes and offered nicotine patch.      All the records are reviewed and case discussed with Care Management/Social Workerr. Management plans discussed with the patient, family and they are in agreement.  CODE STATUS: Full.  TOTAL TIME TAKING CARE OF THIS PATIENT:35 minutes.     POSSIBLE D/C IN 1-2 DAYS, DEPENDING ON CLINICAL CONDITION.   Vaughan Basta M.D on 10/09/2017   Between 7am to 6pm - Pager - [phone]  After 6pm go to [email or web address] - password [email or web address] [facility]  Office  [phone]  CC: Primary care physician; Patient, No Pcp Per  Note: This dictation was prepared with Dragon dictation along with smaller phrase technology. Any transcriptional errors that result from this process are unintentional.

## 2017-10-09 NOTE — Consult Note (Signed)
Hematology/Oncology Consult note [facility] Telephone:([phone] [phone]  Patient Care Team: Patient, No Pcp Per as PCP - General (General Practice)   Name of the patient: Edgar [name]  [id number]  1990-07-12    Reason for consult: recurrent testicular cancer   Requesting physician: Dr. Anselm Jungling  Date of visit: 10/09/2017    History of presenting illness- Patient is a 27 year old male with a history of testicular cancer status post BEP chemotherapy in October 2017.  He has been in remission since then.  He was supposed to get follow-up imaging in January 2019 which she did not show up for secondary to loss of job and no insurance.    He developed sudden onset left lower extremity swelling in June 2019 and went to [facility] ER.  Duplex showed obstruction in the left common femoral vein, saphenofemoral junction and proximal left femoral vein.  CT showed multiple areas of retroperitoneal masses causing compression of the left external iliac vein and associated thrombus.  Mass in the left iliac this caused significant narrowing of the proximal left external iliac vein.  He was also a 4.3 x 4.9 cm left pelvic sidewall and retroperitoneal lymph nodes concerning for metastatic disease.  He was seen in the clinic yesterday by Dr. Mike Gip and there was concerning findings for left lower extremity cellulitis and hence patient was admitted to the hospital.  Patient has also been seen by Dr. Peyton Najjar for surgery and he does not think that clinically patient has any evidence of necrotizing fasciitis.  He is currently on IV antibiotics including cefipeme, vancomycin and clindamycin.  Patient was febrile on admission yesterday at 3 PM but has not had any fever since then.  He continues to have pain and swelling in the left lower extremity along with raised superficial hemorrhagic lesions noted over his left thigh.  He does report pain in his left leg on minimal  movement.  Currently on heparin drip for the left lower extremity DVT  ECOG PS- 1  Pain scale- 5   Review of systems- Review of Systems  Constitutional: Positive for malaise/fatigue. Negative for chills, fever and weight loss.  HENT: Negative for congestion, ear discharge and nosebleeds.   Eyes: Negative for blurred vision.  Respiratory: Negative for cough, hemoptysis, sputum production, shortness of breath and wheezing.   Cardiovascular: Positive for leg swelling. Negative for chest pain, palpitations, orthopnea and claudication.  Gastrointestinal: Negative for abdominal pain, blood in stool, constipation, diarrhea, heartburn, melena, nausea and vomiting.  Genitourinary: Negative for dysuria, flank pain, frequency, hematuria and urgency.  Musculoskeletal: Negative for back pain, joint pain and myalgias.  Skin: Negative for rash.  Neurological: Negative for dizziness, tingling, focal weakness, seizures, weakness and headaches.  Endo/Heme/Allergies: Does not bruise/bleed easily.  Psychiatric/Behavioral: Negative for depression and suicidal ideas. The patient does not have insomnia.     No Known Allergies  Patient Active Problem List   Diagnosis Date Noted  . Acute deep vein thrombosis (DVT) of proximal vein of left lower extremity ([place]) 10/09/2017  . Diarrhea 10/09/2017  . Goals of care, counseling/discussion 10/09/2017  . Cellulitis 10/08/2017  . Current moderate episode of major depressive disorder without prior episode ([place]) 11/20/2016  . Encounter for antineoplastic chemotherapy 01/19/2016  . Testicular cancer ([place]) 11/26/2015     Past Medical History:  Diagnosis Date  . Asthma    AS A CHILD-NO INHALERS  . Cancer ([place])    testicular cancer 11/2016 per pt   . GERD (gastroesophageal reflux  disease)    TUMS PRN     Past Surgical History:  Procedure Laterality Date  . ANKLE FRACTURE SURGERY Right 2004  . ORCHIECTOMY Left 11/26/2015   Procedure: ORCHIECTOMY;  Surgeon:  [name], MD;  Location: [facility] ORS;  Service: Urology;  Laterality: Left;  radical/Inguinal approach  . PERIPHERAL VASCULAR CATHETERIZATION N/A 12/16/2015   Procedure: [name] Cath Insertion;  Surgeon: [name], MD;  Location: [place] [facility];  Service: Cardiovascular;  Laterality: N/A;  . WISDOM TOOTH EXTRACTION  2017    Social History   Socioeconomic History  . Marital status: Single    Spouse name: Not on file  . Number of children: Not on file  . Years of education: Not on file  . Highest education level: Not on file  Occupational History  . Not on file  Social Needs  . Financial resource strain: Not on file  . Food insecurity:    Worry: Not on file    Inability: Not on file  . Transportation needs:    Medical: Not on file    Non-medical: Not on file  Tobacco Use  . Smoking status: Current Every Day Smoker    Packs/day: 0.50    Years: 8.00    Pack years: 4.00    Types: Cigarettes  . Smokeless tobacco: Former Systems developer    Types: Snuff  Substance and Sexual Activity  . Alcohol use: No  . Drug use: Yes    Types: Marijuana  . Sexual activity: Yes  Lifestyle  . Physical activity:    Days per week: Not on file    Minutes per session: Not on file  . Stress: Not on file  Relationships  . Social connections:    Talks on phone: Not on file    Gets together: Not on file    Attends religious service: Not on file    Active member of club or organization: Not on file    Attends meetings of clubs or organizations: Not on file    Relationship status: Not on file  . Intimate partner violence:    Fear of current or ex partner: Not on file    Emotionally abused: Not on file    Physically abused: Not on file    Forced sexual activity: Not on file  Other Topics Concern  . Not on file  Social History Narrative  . Not on file     Family History  Problem Relation Age of Onset  . Skin cancer Mother   . Breast cancer Maternal Grandmother   . Colon cancer Maternal  Grandfather   . Diabetes Maternal Grandfather   . Emphysema Father   . Mental illness Sister        anxiety  . Stroke Paternal Grandmother   . Prostate cancer Neg Hx   . Kidney cancer Neg Hx   . Bladder Cancer Neg Hx      Current Facility-Administered Medications:  .  acetaminophen (TYLENOL) tablet 650 mg, 650 mg, Oral, Q4H PRN, Vaughan Basta, MD, 650 mg at 10/08/17 1732 .  ceFEPIme (MAXIPIME) 2 g in sodium chloride 0.9 % 100 mL IVPB, 2 g, Intravenous, Q12H, Vaughan Basta, MD, Stopped at 10/09/17 1216 .  clindamycin (CLEOCIN) IVPB 300 mg, 300 mg, Intravenous, Q8H, Vaughan Basta, MD, Last Rate: 100 mL/hr at 10/09/17 1603, 300 mg at 10/09/17 1603 .  docusate sodium (COLACE) capsule 100 mg, 100 mg, Oral, BID PRN, Vaughan Basta, MD .  DULoxetine (CYMBALTA) DR capsule 60 mg,  60 mg, Oral, Daily, Vaughan Basta, MD, 60 mg at 10/09/17 0917 .  heparin ADULT infusion 100 units/mL (25000 units/213mL sodium chloride 0.45%), 1,700 Units/hr, Intravenous, Continuous, Larene Beach, [facility], Last Rate: 17 mL/hr at 10/09/17 0915, 1,700 Units/hr at 10/09/17 0915 .  loperamide (IMODIUM) capsule 2 mg, 2 mg, Oral, Q6H PRN, Vaughan Basta, MD .  morphine 2 MG/ML injection 2 mg, 2 mg, Intravenous, Q4H PRN, Vaughan Basta, MD, 2 mg at 10/09/17 1433 .  nicotine (NICODERM CQ - dosed in mg/24 hours) patch 21 mg, 21 mg, Transdermal, Daily, Vaughan Basta, MD, 21 mg at 10/09/17 0917 .  ondansetron (ZOFRAN-ODT) disintegrating tablet 8 mg, 8 mg, Oral, Q8H PRN, Vaughan Basta, MD .  oxyCODONE (Oxy IR/ROXICODONE) immediate release tablet 10 mg, 10 mg, Oral, Q4H PRN, Vaughan Basta, MD, 10 mg at 10/09/17 1601 .  vancomycin (VANCOCIN) 1,500 mg in sodium chloride 0.9 % 500 mL IVPB, 1,500 mg, Intravenous, Q12H, Vaughan Basta, MD, Last Rate: 250 mL/hr at 10/09/17 1601, 1,500 mg at 10/09/17 1601   Physical exam:  Vitals:   10/08/17  1936 10/08/17 2100 10/09/17 0539 10/09/17 1440  BP:  114/65 123/70 130/82  Pulse:  87 97 96  Resp:   17   Temp: 99.7 F (37.6 C) 98.5 F (36.9 C) 98.9 F (37.2 C) 99.8 F (37.7 C)  TempSrc: Oral Oral Oral Oral  SpO2:   98% 97%  Weight:      Height:       Physical Exam  Constitutional: He is oriented to person, place, and time. He appears well-developed and well-nourished.  HENT:  Head: Normocephalic and atraumatic.  Eyes: Pupils are equal, round, and reactive to light. EOM are normal.  Neck: Normal range of motion.  Cardiovascular: Normal rate, regular rhythm and normal heart sounds.  Pulmonary/Chest: Effort normal and breath sounds normal.  Abdominal: Soft. Bowel sounds are normal.  Musculoskeletal:  Diffuse swelling of his left lower extremity.  6 superficial hemorrhagic lesions are noted on his left thigh with pustular areas in between.  There is surrounding redness and warmth around these lesions.  Neurological: He is alert and oriented to person, place, and time.  Skin: Skin is warm and dry.       CMP Latest Ref Rng & Units 10/09/2017  Glucose 70 - 99 mg/dL 100(H)  BUN 6 - 20 mg/dL 10  Creatinine 0.61 - 1.24 mg/dL 0.92  Sodium 135 - 145 mmol/L 136  Potassium 3.5 - 5.1 mmol/L 3.6  Chloride 98 - 111 mmol/L 101  CO2 22 - 32 mmol/L 29  Calcium 8.9 - 10.3 mg/dL 8.1(L)  Total Protein 6.5 - 8.1 g/dL -  Total Bilirubin 0.3 - 1.2 mg/dL -  Alkaline Phos 38 - 126 U/L -  AST 15 - 41 U/L -  ALT 0 - 44 U/L -   CBC Latest Ref Rng & Units 10/09/2017  WBC 3.8 - 10.6 K/uL 10.9(H)  Hemoglobin 13.0 - 18.0 g/dL 8.9(L)  Hematocrit 40.0 - 52.0 % 26.3(L)  Platelets 150 - 440 K/uL 170    @IMAGES @  Ct Femur Left W Contrast  Result Date: 10/08/2017 CLINICAL DATA:  Left lower extremity cellulitis and possible necrotizing fasciitis. EXAM: CT OF THE LOWER RIGHT EXTREMITY WITH CONTRAST TECHNIQUE: Multidetector CT imaging of the lower right extremity was performed according to the  standard protocol following intravenous contrast administration. COMPARISON:  None. CONTRAST:  15mL ISOVUE-300 IOPAMIDOL (ISOVUE-300) INJECTION 61% FINDINGS: Mild inflammatory stranding of the subcutaneous tissues of the left  thigh is noted anteriorly and medially suggesting cellulitis. No definite abscess or fluid collection is noted. No definite bony abnormality or fracture is noted involving the visualized bone. 3.7 cm left external iliac lymph node is noted consistent with metastatic disease. Probable filling defect is seen in the left common femoral and external iliac vein concerning for thrombus. Potentially this may extend into the left superficial femoral vein. IMPRESSION: Mild inflammatory stranding of the subcutaneous tissues of the left thigh is noted anteriorly and medially suggesting cellulitis or possibly edema. No abscess or fluid collection is noted. Filling defect is noted in the visualized portion of the left external iliac vein and common femoral vein suggesting deep venous thrombosis. Potentially this may extended to the left superficial femoral vein and further evaluation with Doppler ultrasound is recommended. These results will be called to the ordering clinician or representative by the Radiologist Assistant, and communication documented in the PACS or zVision Dashboard. 3.7 cm left external iliac lymph node is noted consistent with metastatic disease. Electronically Signed   By: Marijo Conception, M.D.   On: 10/08/2017 22:24   US Venous Img Lower Unilateral Left  Result Date: 10/09/2017 CLINICAL DATA:  Left lower extremity cellulitis with CT demonstrating possible DVT in the common femoral and external iliac veins. EXAM: LEFT LOWER EXTREMITY VENOUS DOPPLER ULTRASOUND TECHNIQUE: Gray-scale sonography with graded compression, as well as color Doppler and duplex ultrasound were performed to evaluate the lower extremity deep venous systems from the level of the common femoral vein and  including the common femoral, femoral, profunda femoral, popliteal and calf veins including the posterior tibial, peroneal and gastrocnemius veins when visible. The superficial great saphenous vein was also interrogated. Spectral Doppler was utilized to evaluate flow at rest and with distal augmentation maneuvers in the common femoral, femoral and popliteal veins. COMPARISON:  None. FINDINGS: Contralateral Common Femoral Vein: Respiratory phasicity is normal and symmetric with the symptomatic side. No evidence of thrombus. Normal compressibility. Common Femoral Vein: No evidence of thrombus. Normal compressibility, respiratory phasicity and response to augmentation. Saphenofemoral Junction: No evidence of thrombus. Normal compressibility and flow on color Doppler imaging. Profunda Femoral Vein: No evidence of thrombus. Normal compressibility and flow on color Doppler imaging. Femoral Vein: No evidence of thrombus. Normal compressibility, respiratory phasicity and response to augmentation. Popliteal Vein: No evidence of thrombus. Normal compressibility, respiratory phasicity and response to augmentation. Calf Veins: No evidence of thrombus. Normal compressibility and flow on color Doppler imaging. Superficial Great Saphenous Vein: No evidence of thrombus. Normal compressibility. Venous Reflux:  None. Other Findings: No evidence of superficial thrombophlebitis or abnormal fluid collection. IMPRESSION: No evidence of left lower extremity deep venous thrombosis. CT findings are felt to likely relate to streaming of contrast during early opacification of the venous system. If there is any progressive edema of the left lower extremity, follow-up duplex ultrasound may be helpful. Electronically Signed   By: Aletta Edouard M.D.   On: 10/09/2017 15:27   Dg Chest Portable 1 View  Result Date: 10/06/2017 CLINICAL DATA:  Initial evaluation for acute fever. History of asthma. EXAM: PORTABLE CHEST 1 VIEW COMPARISON:  Prior  CT from 09/30/2016 FINDINGS: Right-sided Port-A-Cath in place with tip overlying the right atrium. Transverse heart size at the upper limits of normal for AP technique. Mediastinal silhouette within normal limits. Lungs mildly hypoinflated. Mild scattered peribronchial thickening, greatest within the lung bases. No focal infiltrates. No pulmonary edema or pleural effusion. No pneumothorax. Remotely healed right-sided rib fracture noted. No acute  osseous abnormality. IMPRESSION: Mild scattered peribronchial thickening, suspected to be related history of asthma. Acute bronchiolitis could be considered in the correct clinical setting. No focal infiltrates to suggest pneumonia. Electronically Signed   By: Jeannine Boga M.D.   On: 10/06/2017 22:30   Dg Femur Min 2 Views Left  Result Date: 10/08/2017 CLINICAL DATA:  Fever with left leg swelling. EXAM: LEFT FEMUR 2 VIEWS COMPARISON:  None. FINDINGS: There is no evidence of fracture or other focal bone lesions. Diffuse soft tissue swelling. No subcutaneous emphysema. IMPRESSION: Diffuse soft tissue swelling.  No subcutaneous emphysema. Electronically Signed   By: Titus Dubin M.D.   On: 10/08/2017 17:11    Assessment and plan- Patient is a 27 y.o. male admitted for LLE cellulitis and superficial ulcers. He has recurrent testicular cancer and is awaiting to start TIP chemotherapy after acute cellulitis resolves.   1. Repeat LLE USG surprisingly does not reveal any acute DVT seen on doppler on 09/12/17. I would recommend however to continue heparin gtt at this time.   2. Continue IV antibiotics for left thigh cellulitis. Recommend checking pulses of LLE regularly. If clinically there is worsening cellulitis/ redness/ swelling or vascular compromise- consider MRI of the left thigh and leg for further assessment.   3. Normocytic anemia- check iron studies, b12 and folate  Will continue to follow.   Visit Diagnosis 1. Pain   2. DVT (deep venous  thrombosis) ([place])     Dr. Randa Evens, MD, MPH [facility] [phone] 10/09/2017  4:38 PM

## 2017-10-09 NOTE — Progress Notes (Signed)
Patient ID: Edgar [name], male   DOB: Jan 22, 1991, 27 y.o.   MRN: [id number]     [place] Hospital Day(s): 1.   Post op day(s):  Marland Kitchen   Interval History: Patient seen and examined, no acute events or new complaints overnight. Patient reports left thigh pain is not getting better.   Vital signs in last 24 hours: [min-max] current  Temp:  [98.5 F (36.9 C)-102.3 F (39.1 C)] 98.9 F (37.2 C) (07/27 0539) Pulse Rate:  [87-122] 97 (07/27 0539) Resp:  [16-20] 17 (07/27 0539) BP: (111-125)/(65-73) 123/70 (07/27 0539) SpO2:  [94 %-98 %] 98 % (07/27 0539) Weight:  [121.6 kg (268 lb 1.6 oz)-122.3 kg (269 lb 11.2 oz)] 121.6 kg (268 lb 1.6 oz) (07/26 1538)     Height: 6\' 3"  (190.5 cm) Weight: 121.6 kg (268 lb 1.6 oz) BMI (Calculated): 33.51   Physical Exam:  Constitutional: alert, cooperative and no distress  Extremity: left thigh area of erythema, warm and tender to touch. Multiple hemorrhagic blisters. Complete range of motion. There is significant swelling. No crepitus on palpation.   Labs:  CBC Latest Ref Rng & Units 10/09/2017 10/08/2017 10/06/2017  WBC 3.8 - 10.6 K/uL 10.9(H) 12.1(H) 9.2  Hemoglobin 13.0 - 18.0 g/dL 8.9(L) 8.9(L) 8.9(L)  Hematocrit 40.0 - 52.0 % 26.3(L) 26.3(L) 26.1(L)  Platelets 150 - 440 K/uL 170 137(L) 81(L)   CMP Latest Ref Rng & Units 10/09/2017 10/08/2017 10/06/2017  Glucose 70 - 99 mg/dL 100(H) 117(H) 110(H)  BUN 6 - 20 mg/dL 10 10 9   Creatinine 0.61 - 1.24 mg/dL 0.92 0.95 0.73  Sodium 135 - 145 mmol/L 136 133(L) 135  Potassium 3.5 - 5.1 mmol/L 3.6 3.6 3.3(L)  Chloride 98 - 111 mmol/L 101 97(L) 97(L)  CO2 22 - 32 mmol/L 29 28 29   Calcium 8.9 - 10.3 mg/dL 8.1(L) 8.5(L) 8.7(L)  Total Protein 6.5 - 8.1 g/dL - 6.4(L) 6.2(L)  Total Bilirubin 0.3 - 1.2 mg/dL - 0.6 0.7  Alkaline Phos 38 - 126 U/L - 54 55  AST 15 - 41 U/L - 14(L) 12(L)  ALT 0 - 44 U/L - 12 13    Imaging studies:  EXAM: CT OF THE LOWER RIGHT EXTREMITY WITH  CONTRAST  TECHNIQUE: Multidetector CT imaging of the lower right extremity was performed according to the standard protocol following intravenous contrast administration.  COMPARISON:  None.  CONTRAST:  179mL ISOVUE-300 IOPAMIDOL (ISOVUE-300) INJECTION 61%  FINDINGS: Mild inflammatory stranding of the subcutaneous tissues of the left thigh is noted anteriorly and medially suggesting cellulitis. No definite abscess or fluid collection is noted. No definite bony abnormality or fracture is noted involving the visualized bone. 3.7 cm left external iliac lymph node is noted consistent with metastatic disease. Probable filling defect is seen in the left common femoral and external iliac vein concerning for thrombus. Potentially this may extend into the left superficial femoral vein.  IMPRESSION: Mild inflammatory stranding of the subcutaneous tissues of the left thigh is noted anteriorly and medially suggesting cellulitis or possibly edema. No abscess or fluid collection is noted.  Filling defect is noted in the visualized portion of the left external iliac vein and common femoral vein suggesting deep venous thrombosis. Potentially this may extended to the left superficial femoral vein and further evaluation with Doppler ultrasound is recommended. These results will be called to the ordering clinician or representative by the Radiologist Assistant, and communication documented in the PACS or zVision Dashboard.  3.7 cm left external  iliac lymph node is noted consistent with metastatic disease.  Electronically Signed   By: Marijo Conception, M.D.   On: 10/08/2017 22:24  Assessment/Plan:  27 y.o. male with left leg cellulitis that was not responding to oral antibiotic, complicated by pertinent comorbidities including DVT on anticoagulation, bronchia asthma, testicular cancer, and GERD.  Today on physical exam there is no improvement of the area. I will recommend to consider a  left lower extremity duplex to see if the DVT is not progressing. Physical exam not consistent with necrotizing fasciitis. Continue IV abx therapy.   Arnold Long, MD

## 2017-10-10 ENCOUNTER — Inpatient Hospital Stay: Payer: BLUE CROSS/BLUE SHIELD

## 2017-10-10 ENCOUNTER — Encounter: Payer: Self-pay | Admitting: Radiology

## 2017-10-10 LAB — IRON AND TIBC
Iron: 20 ug/dL — ABNORMAL LOW (ref 45–182)
Saturation Ratios: 11 % — ABNORMAL LOW (ref 17.9–39.5)
TIBC: 190 ug/dL — ABNORMAL LOW (ref 250–450)
UIBC: 170 ug/dL

## 2017-10-10 LAB — VANCOMYCIN, TROUGH: Vancomycin Tr: 8 ug/mL — ABNORMAL LOW (ref 15–20)

## 2017-10-10 LAB — CBC
HCT: 25.3 % — ABNORMAL LOW (ref 40.0–52.0)
HEMOGLOBIN: 8.6 g/dL — AB (ref 13.0–18.0)
MCH: 30.2 pg (ref 26.0–34.0)
MCHC: 34 g/dL (ref 32.0–36.0)
MCV: 88.8 fL (ref 80.0–100.0)
Platelets: 212 10*3/uL (ref 150–440)
RBC: 2.85 MIL/uL — AB (ref 4.40–5.90)
RDW: 12.4 % (ref 11.5–14.5)
WBC: 12.5 10*3/uL — ABNORMAL HIGH (ref 3.8–10.6)

## 2017-10-10 LAB — APTT
APTT: 58 s — AB (ref 24–36)
aPTT: 59 seconds — ABNORMAL HIGH (ref 24–36)
aPTT: 73 seconds — ABNORMAL HIGH (ref 24–36)

## 2017-10-10 LAB — VITAMIN B12: Vitamin B-12: 829 pg/mL (ref 180–914)

## 2017-10-10 LAB — FOLATE: FOLATE: 10.8 ng/mL (ref >5.9)

## 2017-10-10 LAB — HIV ANTIBODY (ROUTINE TESTING W REFLEX): HIV SCREEN 4TH GENERATION: NONREACTIVE

## 2017-10-10 LAB — FERRITIN: FERRITIN: 421 ng/mL — AB (ref 24–336)

## 2017-10-10 LAB — HEPARIN LEVEL (UNFRACTIONATED): Heparin Unfractionated: 0.16 IU/mL — ABNORMAL LOW (ref 0.30–0.70)

## 2017-10-10 MED ORDER — IOPAMIDOL (ISOVUE-300) INJECTION 61%
125.0000 mL | Freq: Once | INTRAVENOUS | Status: AC | PRN
  Administered 2017-10-10: 125 mL via INTRAVENOUS

## 2017-10-10 MED ORDER — VANCOMYCIN HCL 10 G IV SOLR
1500.0000 mg | Freq: Three times a day (TID) | INTRAVENOUS | Status: DC
  Administered 2017-10-10 – 2017-10-16 (×17): 1500 mg via INTRAVENOUS
  Filled 2017-10-10 (×22): qty 1500

## 2017-10-10 MED ORDER — HEPARIN BOLUS VIA INFUSION
1500.0000 [IU] | Freq: Once | INTRAVENOUS | Status: AC
  Administered 2017-10-10: 1500 [IU] via INTRAVENOUS
  Filled 2017-10-10: qty 1500

## 2017-10-10 NOTE — Progress Notes (Signed)
Pharmacy Antibiotic Note  Edgar [name] is a 27 y.o. male admitted on 10/08/2017 with cellulitis.  Pharmacy has been consulted for Vancomycin and Cefepime dosing.  Plan: Cefepime 2 g IV q12 hours  7/28: Vancomycin:  Vancomycin Trough level 8 mcg/ml. Will change Vancomycin dose to 1500 mg IV q8 hours. Will recheck Vancomycin level prior to the 4th dose.  Height: 6\' 3"  (190.5 cm) Weight: 268 lb 1.6 oz (121.6 kg) IBW/kg (Calculated) : 84.5  Temp (24hrs), Avg:98.5 F (36.9 C), Min:97.7 F (36.5 C), Max:99.8 F (37.7 C)  Recent Labs  Lab 10/06/17 2222 10/08/17 1611 10/08/17 1731 10/08/17 1949 10/09/17 0728 10/10/17 0015 10/10/17 1332  WBC 9.2 12.1*  --   --  10.9* 12.5*  --   CREATININE 0.73 0.95  --   --  0.92  --   --   LATICACIDVEN 0.9  --  1.3 0.9  --   --   --   VANCOTROUGH  --   --   --   --   --   --  8*    Estimated Creatinine Clearance: 166.4 mL/min (by C-G formula based on SCr of 0.92 mg/dL).    No Known Allergies  Antimicrobials this admission: Vancomycin 7/26 >>  Cefepime 7/26 >>  Clindamycin 7/26 >>   Thank you for allowing pharmacy to be a part of this patient's care. Larene Beach, PharmD  10/10/2017 2:25 PM

## 2017-10-10 NOTE — Progress Notes (Signed)
Vaseline gauze to left upper leg placed per vascular. Madlyn Frankel, RN

## 2017-10-10 NOTE — Consult Note (Signed)
Reason for Consult:  LEFT external iliac Vein thrombosis Referring Physician: Dr. Malachy Chamber Edgar [name] is an 27 y.o. male.  HPI: Patient with metastatic testicular cancer. Left lower extremity edema, necrotic blisters. CTV with evidence of multiple large pelvic and retroperitoneal necrotic lymph nodes with compression of left external iliac vein.  Past Medical History:  Diagnosis Date  . Asthma    AS A CHILD-NO INHALERS  . Cancer ([place])    testicular cancer 11/2016 per pt   . GERD (gastroesophageal reflux disease)    TUMS PRN    Past Surgical History:  Procedure Laterality Date  . ANKLE FRACTURE SURGERY Right 2004  . ORCHIECTOMY Left 11/26/2015   Procedure: ORCHIECTOMY;  Surgeon: [name], MD;  Location: [facility];  Service: Urology;  Laterality: Left;  radical/Inguinal approach  . PERIPHERAL VASCULAR CATHETERIZATION N/A 12/16/2015   Procedure: [name] Cath Insertion;  Surgeon: [name], MD;  Location: [place] [facility];  Service: Cardiovascular;  Laterality: N/A;  . WISDOM TOOTH EXTRACTION  2017    Family History  Problem Relation Age of Onset  . Skin cancer Mother   . Breast cancer Maternal Grandmother   . Colon cancer Maternal Grandfather   . Diabetes Maternal Grandfather   . Emphysema Father   . Mental illness Sister        anxiety  . Stroke Paternal Grandmother   . Prostate cancer Neg Hx   . Kidney cancer Neg Hx   . Bladder Cancer Neg Hx     Social History:  reports that he has been smoking cigarettes.  He has a 4.00 pack-year smoking history. He has quit using smokeless tobacco. His smokeless tobacco use included snuff. He reports that he has current or past drug history. Drug: Marijuana. He reports that he does not drink alcohol.  Allergies: No Known Allergies  Medications: I have reviewed the patient's current medications.  Results for orders placed or performed during the hospital encounter of 10/08/17 (from the past 48 hour(s))  Lactic acid, plasma      Status: None   Collection Time: 10/08/17  7:49 PM  Result Value Ref Range   Lactic Acid, Venous 0.9 0.5 - 1.9 mmol/L    Comment: Performed at [facility], [address] [place], [place] [address]  Basic metabolic panel     Status: Abnormal   Collection Time: 10/09/17  7:28 AM  Result Value Ref Range   Sodium 136 135 - 145 mmol/L   Potassium 3.6 3.5 - 5.1 mmol/L   Chloride 101 98 - 111 mmol/L   CO2 29 22 - 32 mmol/L   Glucose, Bld 100 (H) 70 - 99 mg/dL   BUN 10 6 - 20 mg/dL   Creatinine, Ser 0.92 0.61 - 1.24 mg/dL   Calcium 8.1 (L) 8.9 - 10.3 mg/dL   GFR calc non Af Amer >60 >60 mL/min   GFR calc Af Amer >60 >60 mL/min    Comment: (NOTE) The eGFR has been calculated using the CKD EPI equation. This calculation has not been validated in all clinical situations. eGFR's persistently <60 mL/min signify possible Chronic Kidney Disease.    Anion gap 6 5 - 15    Comment: Performed at [facility], [address] [place], [place] [address]  CBC     Status: Abnormal   Collection Time: 10/09/17  7:28 AM  Result Value Ref Range   WBC 10.9 (H) 3.8 - 10.6 K/uL   RBC 2.94 (L) 4.40 -  5.90 MIL/uL   Hemoglobin 8.9 (L) 13.0 - 18.0 g/dL   HCT 26.3 (L) 40.0 - 52.0 %   MCV 89.4 80.0 - 100.0 fL   MCH 30.3 26.0 - 34.0 pg   MCHC 33.9 32.0 - 36.0 g/dL   RDW 12.5 11.5 - 14.5 %   Platelets 170 150 - 440 K/uL    Comment: Performed at [facility], [address] [place], [place] [address]  APTT     Status: Abnormal   Collection Time: 10/09/17  7:28 AM  Result Value Ref Range   aPTT 48 (H) 24 - 36 seconds    Comment:        IF BASELINE aPTT IS ELEVATED, SUGGEST PATIENT RISK ASSESSMENT BE USED TO DETERMINE APPROPRIATE ANTICOAGULANT THERAPY. Performed at [facility], [place], [place] [address]   Heparin level (unfractionated)     Status: None   Collection Time: 10/09/17  7:28 AM  Result Value Ref Range   Heparin Unfractionated  0.40 0.30 - 0.70 IU/mL    Comment: (NOTE) If heparin results are below expected values, and patient dosage has  been confirmed, suggest follow up testing of antithrombin III levels. Performed at [facility], [address] [place], [place] [address]   APTT     Status: Abnormal   Collection Time: 10/09/17  3:36 PM  Result Value Ref Range   aPTT 56 (H) 24 - 36 seconds    Comment:        IF BASELINE aPTT IS ELEVATED, SUGGEST PATIENT RISK ASSESSMENT BE USED TO DETERMINE APPROPRIATE ANTICOAGULANT THERAPY. Performed at [facility], [address] [place], [place] [address]   CBC     Status: Abnormal   Collection Time: 10/10/17 12:15 AM  Result Value Ref Range   WBC 12.5 (H) 3.8 - 10.6 K/uL   RBC 2.85 (L) 4.40 - 5.90 MIL/uL   Hemoglobin 8.6 (L) 13.0 - 18.0 g/dL   HCT 25.3 (L) 40.0 - 52.0 %   MCV 88.8 80.0 - 100.0 fL   MCH 30.2 26.0 - 34.0 pg   MCHC 34.0 32.0 - 36.0 g/dL   RDW 12.4 11.5 - 14.5 %   Platelets 212 150 - 440 K/uL    Comment: Performed at [facility], [address] [place], [place] [address]  Iron and TIBC     Status: Abnormal   Collection Time: 10/10/17 12:15 AM  Result Value Ref Range   Iron 20 (L) 45 - 182 ug/dL   TIBC 190 (L) 250 - 450 ug/dL   Saturation Ratios 11 (L) 17.9 - 39.5 %   UIBC 170 ug/dL    Comment: Performed at [facility], [address] [place], [place] [address]  Ferritin     Status: Abnormal   Collection Time: 10/10/17 12:15 AM  Result Value Ref Range   Ferritin 421 (H) 24 - 336 ng/mL    Comment: Performed at [facility], [address] [place], [place] [address]  Vitamin B12     Status: None   Collection Time: 10/10/17 12:15 AM  Result Value Ref Range   Vitamin B-12 829 180 - 914 pg/mL    Comment: (NOTE) This assay is not validated for testing neonatal or myeloproliferative syndrome specimens for Vitamin B12 levels. Performed at [facility], [place] [address], [place],  [place] [address]   Folate     Status: None   Collection Time: 10/10/17 12:15 AM  Result  Value Ref Range   Folate 10.8 >5.9 ng/mL    Comment: Performed at [facility], [address] [place], [place] [address]  APTT     Status: Abnormal   Collection Time: 10/10/17 12:15 AM  Result Value Ref Range   aPTT 58 (H) 24 - 36 seconds    Comment:        IF BASELINE aPTT IS ELEVATED, SUGGEST PATIENT RISK ASSESSMENT BE USED TO DETERMINE APPROPRIATE ANTICOAGULANT THERAPY. Performed at [facility], [place], [place] [address]   Heparin level (unfractionated)     Status: Abnormal   Collection Time: 10/10/17 12:15 AM  Result Value Ref Range   Heparin Unfractionated 0.16 (L) 0.30 - 0.70 IU/mL    Comment: (NOTE) If heparin results are below expected values, and patient dosage has  been confirmed, suggest follow up testing of antithrombin III levels. Performed at [facility], [address] [place], [place] [address]   APTT     Status: Abnormal   Collection Time: 10/10/17  7:53 AM  Result Value Ref Range   aPTT 59 (H) 24 - 36 seconds    Comment:        IF BASELINE aPTT IS ELEVATED, SUGGEST PATIENT RISK ASSESSMENT BE USED TO DETERMINE APPROPRIATE ANTICOAGULANT THERAPY. Performed at [facility], [address] [place], [place] [address]   Vancomycin, trough     Status: Abnormal   Collection Time: 10/10/17  1:32 PM  Result Value Ref Range   Vancomycin Tr 8 (L) 15 - 20 ug/mL    Comment: Performed at [facility], [address] [place], [place] [address]    Ct Femur Left W Contrast  Result Date: 10/08/2017 CLINICAL DATA:  Left lower extremity cellulitis and possible necrotizing fasciitis. EXAM: CT OF THE LOWER RIGHT EXTREMITY WITH CONTRAST TECHNIQUE: Multidetector CT imaging of the lower right extremity was performed according to the standard protocol following intravenous contrast administration. COMPARISON:  None. CONTRAST:   156m ISOVUE-300 IOPAMIDOL (ISOVUE-300) INJECTION 61% FINDINGS: Mild inflammatory stranding of the subcutaneous tissues of the left thigh is noted anteriorly and medially suggesting cellulitis. No definite abscess or fluid collection is noted. No definite bony abnormality or fracture is noted involving the visualized bone. 3.7 cm left external iliac lymph node is noted consistent with metastatic disease. Probable filling defect is seen in the left common femoral and external iliac vein concerning for thrombus. Potentially this may extend into the left superficial femoral vein. IMPRESSION: Mild inflammatory stranding of the subcutaneous tissues of the left thigh is noted anteriorly and medially suggesting cellulitis or possibly edema. No abscess or fluid collection is noted. Filling defect is noted in the visualized portion of the left external iliac vein and common femoral vein suggesting deep venous thrombosis. Potentially this may extended to the left superficial femoral vein and further evaluation with Doppler ultrasound is recommended. These results will be called to the ordering clinician or representative by the Radiologist Assistant, and communication documented in the PACS or zVision Dashboard. 3.7 cm left external iliac lymph node is noted consistent with metastatic disease. Electronically Signed   By: JMarijo Conception M.D.   On: 10/08/2017 22:24   UKoreaVenous Img Lower Unilateral Left  Result Date: 10/09/2017 CLINICAL DATA:  Left lower extremity cellulitis with CT demonstrating possible DVT in the common femoral and external iliac veins. EXAM: LEFT LOWER EXTREMITY VENOUS DOPPLER ULTRASOUND TECHNIQUE: Gray-scale sonography with graded compression, as well as color Doppler and duplex  ultrasound were performed to evaluate the lower extremity deep venous systems from the level of the common femoral vein and including the common femoral, femoral, profunda femoral, popliteal and calf veins including the  posterior tibial, peroneal and gastrocnemius veins when visible. The superficial great saphenous vein was also interrogated. Spectral Doppler was utilized to evaluate flow at rest and with distal augmentation maneuvers in the common femoral, femoral and popliteal veins. COMPARISON:  None. FINDINGS: Contralateral Common Femoral Vein: Respiratory phasicity is normal and symmetric with the symptomatic side. No evidence of thrombus. Normal compressibility. Common Femoral Vein: No evidence of thrombus. Normal compressibility, respiratory phasicity and response to augmentation. Saphenofemoral Junction: No evidence of thrombus. Normal compressibility and flow on color Doppler imaging. Profunda Femoral Vein: No evidence of thrombus. Normal compressibility and flow on color Doppler imaging. Femoral Vein: No evidence of thrombus. Normal compressibility, respiratory phasicity and response to augmentation. Popliteal Vein: No evidence of thrombus. Normal compressibility, respiratory phasicity and response to augmentation. Calf Veins: No evidence of thrombus. Normal compressibility and flow on color Doppler imaging. Superficial Great Saphenous Vein: No evidence of thrombus. Normal compressibility. Venous Reflux:  None. Other Findings: No evidence of superficial thrombophlebitis or abnormal fluid collection. IMPRESSION: No evidence of left lower extremity deep venous thrombosis. CT findings are felt to likely relate to streaming of contrast during early opacification of the venous system. If there is any progressive edema of the left lower extremity, follow-up duplex ultrasound may be helpful. Electronically Signed   By: Aletta Edouard M.D.   On: 10/09/2017 15:27   Ct Extremity Lower Left W Contrast  Result Date: 10/10/2017 CLINICAL DATA:  History of testicular cancer with LEFT LOWER EXTREMITY PAIN AND SWELLING. EXAM: CT ABDOMEN/PELVIS WITH CONTRAST. CT OF THE LOWER LEFT EXTREMITY WITH CONTRAST TECHNIQUE: Multidetector CT  imaging of the abdomen and pelvis was performed using the standard protocol following bolus administration of intravenous contrast. CT examination of the left lower extremity was also performed. CONTRAST:  12m ISOVUE-300 IOPAMIDOL (ISOVUE-300) INJECTION 61% COMPARISON:  Prior CT scan from 2 days ago. FINDINGS: Lower chest: The lung bases are clear of acute process. Minimal dependent bibasilar atelectasis. No pleural effusion or pulmonary lesions. The heart is normal in size. No pericardial effusion. The distal esophagus and aorta are unremarkable. Hepatobiliary: No focal hepatic lesions or intrahepatic biliary dilatation. The gallbladder is mildly contracted. No common bile duct dilatation. Pancreas: No mass, inflammation or ductal dilatation. Spleen: Normal size.  No focal lesions. Adrenals/Urinary Tract: The adrenal glands and kidneys are unremarkable. No renal, ureteral or bladder calculi or mass. Stomach/Bowel: The stomach, duodenum, small bowel and colon are unremarkable. No acute inflammatory changes, mass lesions or obstructive findings. The terminal ileum and appendix are normal. Vascular/Lymphatic: The aorta and branch vessels are normal. The IVC is patent. There is retroperitoneal lymphadenopathy mainly in the left para-aortic region. 11.5 mm lymph node noted on image number 54. 17.5 mm left para-aortic lymph node on image number 66. 16 mm left common iliac lymph node on image number 74. Again demonstrated is a large necrotic appearing nodal mass in the medial left external iliac region. This measures a maximum of 3.7 cm and adjacent to this the left external iliac vein is thrombosed. Thrombus extends down into the common femoral vein to just above the profundus femoral vein. The right-sided venous structures are patent. No deep venous thrombosis is identified in the left thigh. The femoral and popliteal veins are patent. Reproductive: The prostate gland and seminal vesicles are unremarkable.  Other: No  intrapelvic or intra-abdominal fluid collections. Musculoskeletal: No significant bony findings. LEFT LOWER EXTREMITY: Extensive subcutaneous soft tissue swelling/edema/fluid involving the entire imaged left thigh with associated skin thickening and areas of skin blistering. This could be due to the deep venous thrombosis and or cellulitis. Is also diffuse edema in the muscular compartments of the thigh consistent with myofasciitis. No discrete abscess or findings to suggest pyomyositis. No knee or hip joint effusions to suggest septic arthritis. Enlarged left axillary lymph nodes likely inflammatory. IMPRESSION: 1. Left-sided retroperitoneal lymphadenopathy and left pelvic lymphadenopathy likely metastatic testicular cancer. The large necrotic appearing left medial external iliac lymph node is likely compressing the left external iliac vein and causing deep venous thrombosis extending down into the left groin. This does not extend down into the left thigh. 2. Marked and diffuse subcutaneous soft tissue swelling/edema/fluid along with skin thickening and skin blisters involving the left thigh. This could be due to the deep venous thrombosis or possibly could be secondary cellulitis and myofasciitis. No discrete drainable abscess is identified. 3. No definite CT findings for septic arthritis or osteomyelitis and no findings for metastatic disease involving the lung bases or bony structures. Electronically Signed   By: Marijo Sanes M.D.   On: 10/10/2017 15:28   Ct Venogram Abd/pel  Result Date: 10/10/2017 CLINICAL DATA:  History of testicular cancer with LEFT LOWER EXTREMITY PAIN AND SWELLING. EXAM: CT ABDOMEN/PELVIS WITH CONTRAST. CT OF THE LOWER LEFT EXTREMITY WITH CONTRAST TECHNIQUE: Multidetector CT imaging of the abdomen and pelvis was performed using the standard protocol following bolus administration of intravenous contrast. CT examination of the left lower extremity was also performed. CONTRAST:  14m  ISOVUE-300 IOPAMIDOL (ISOVUE-300) INJECTION 61% COMPARISON:  Prior CT scan from 2 days ago. FINDINGS: Lower chest: The lung bases are clear of acute process. Minimal dependent bibasilar atelectasis. No pleural effusion or pulmonary lesions. The heart is normal in size. No pericardial effusion. The distal esophagus and aorta are unremarkable. Hepatobiliary: No focal hepatic lesions or intrahepatic biliary dilatation. The gallbladder is mildly contracted. No common bile duct dilatation. Pancreas: No mass, inflammation or ductal dilatation. Spleen: Normal size.  No focal lesions. Adrenals/Urinary Tract: The adrenal glands and kidneys are unremarkable. No renal, ureteral or bladder calculi or mass. Stomach/Bowel: The stomach, duodenum, small bowel and colon are unremarkable. No acute inflammatory changes, mass lesions or obstructive findings. The terminal ileum and appendix are normal. Vascular/Lymphatic: The aorta and branch vessels are normal. The IVC is patent. There is retroperitoneal lymphadenopathy mainly in the left para-aortic region. 11.5 mm lymph node noted on image number 54. 17.5 mm left para-aortic lymph node on image number 66. 16 mm left common iliac lymph node on image number 74. Again demonstrated is a large necrotic appearing nodal mass in the medial left external iliac region. This measures a maximum of 3.7 cm and adjacent to this the left external iliac vein is thrombosed. Thrombus extends down into the common femoral vein to just above the profundus femoral vein. The right-sided venous structures are patent. No deep venous thrombosis is identified in the left thigh. The femoral and popliteal veins are patent. Reproductive: The prostate gland and seminal vesicles are unremarkable. Other: No intrapelvic or intra-abdominal fluid collections. Musculoskeletal: No significant bony findings. LEFT LOWER EXTREMITY: Extensive subcutaneous soft tissue swelling/edema/fluid involving the entire imaged left thigh  with associated skin thickening and areas of skin blistering. This could be due to the deep venous thrombosis and or cellulitis. Is also diffuse edema  in the muscular compartments of the thigh consistent with myofasciitis. No discrete abscess or findings to suggest pyomyositis. No knee or hip joint effusions to suggest septic arthritis. Enlarged left axillary lymph nodes likely inflammatory. IMPRESSION: 1. Left-sided retroperitoneal lymphadenopathy and left pelvic lymphadenopathy likely metastatic testicular cancer. The large necrotic appearing left medial external iliac lymph node is likely compressing the left external iliac vein and causing deep venous thrombosis extending down into the left groin. This does not extend down into the left thigh. 2. Marked and diffuse subcutaneous soft tissue swelling/edema/fluid along with skin thickening and skin blisters involving the left thigh. This could be due to the deep venous thrombosis or possibly could be secondary cellulitis and myofasciitis. No discrete drainable abscess is identified. 3. No definite CT findings for septic arthritis or osteomyelitis and no findings for metastatic disease involving the lung bases or bony structures. Electronically Signed   By: Marijo Sanes M.D.   On: 10/10/2017 15:28    Review of Systems  Constitutional: Positive for malaise/fatigue.  Cardiovascular: Positive for leg swelling. Negative for chest pain.  Gastrointestinal: Negative for abdominal pain.   Blood pressure 123/77, pulse 85, temperature 97.7 F (36.5 C), temperature source Oral, resp. rate 15, height 6' 3"  (1.905 m), weight 121.6 kg (268 lb 1.6 oz), SpO2 96 %. Physical Exam  Nursing note and vitals reviewed. Constitutional: He appears well-developed.  Cardiovascular: Normal rate.  Respiratory: Effort normal.  GI: Soft.  Musculoskeletal: He exhibits edema and tenderness.  LEFT lower extremity with significant edema, necrotic blisters on proximal thigh, skin  otherwise pink, soft, no evidence of phlegmasia or venous necrosis/ischemia     Assessment/Plan:   Left external Iliac vein thrombosis with significant lower extremity edema and blistering secondary to retroperitoneal and pelvic lymphadenopathy from metastatic testicular cancer.  Discussed with patient the possibility of lysis of external iliac vein thrombus to alleviate leg edema and blistering-however, with significant compression from pelvic lymphadenopathy the probability of recurrence is high.  The patient wishes to speak with his oncologist tomorrow and hopefully resume chemotherapy to decrease the lymphatic bulk first.  Will reassess tomorrow after discussion with his oncologist.  Continue Hep gtt for now.  Vaseline gauze and dry dressing to blisters for now- wound care consult tomorrow.  Esco, Miechia A 10/10/2017, 6:31 PM

## 2017-10-10 NOTE — Progress Notes (Signed)
[facility] [place] NAME: Edgar [name]    MR#:  [id number]  DATE OF BIRTH:  1990-03-22  SUBJECTIVE:  CHIEF COMPLAINT:  No chief complaint on file. Came with worsening swelling and pain on left thigh.  Admitted for cellulitis with failure of outpatient oral treatment. Still have significant swelling and now his lesions on his thigh is leaking serosanguineous discharge.  REVIEW OF SYSTEMS:  CONSTITUTIONAL: No fever, fatigue or weakness.  EYES: No blurred or double vision.  EARS, NOSE, AND THROAT: No tinnitus or ear pain.  RESPIRATORY: No cough, shortness of breath, wheezing or hemoptysis.  CARDIOVASCULAR: No chest pain, orthopnea, edema.  GASTROINTESTINAL: No nausea, vomiting, diarrhea or abdominal pain.  GENITOURINARY: No dysuria, hematuria.  ENDOCRINE: No polyuria, nocturia,  HEMATOLOGY: No anemia, easy bruising or bleeding SKIN: No rash or lesion. MUSCULOSKELETAL: No joint pain or arthritis.  Left thigh and leg swelling. NEUROLOGIC: No tingling, numbness, weakness.  PSYCHIATRY: No anxiety or depression.   ROS  DRUG ALLERGIES:  No Known Allergies  VITALS:  Blood pressure 123/77, pulse 85, temperature 97.7 F (36.5 C), temperature source Oral, resp. rate 15, height 6\' 3"  (1.905 m), weight 121.6 kg (268 lb 1.6 oz), SpO2 96 %.  PHYSICAL EXAMINATION:   GENERAL:  27 y.o.-year-old patient lying in the bed with no acute distress.  EYES: Pupils equal, round, reactive to light and accommodation. No scleral icterus. Extraocular muscles intact.  HEENT: Head atraumatic, normocephalic. Oropharynx and nasopharynx clear.  NECK:  Supple, no jugular venous distention. No thyroid enlargement, no tenderness.  LUNGS: Normal breath sounds bilaterally, no wheezing, rales,rhonchi or crepitation. No use of accessory muscles of respiration.  CARDIOVASCULAR: S1, S2 normal. No murmurs, rubs, or gallops.  ABDOMEN: Soft, nontender, nondistended. Bowel sounds  present. No organomegaly or mass.  EXTREMITIES: Left leg and thigh are swollen.  Left thigh has at least 5-6 spots on the medial and posterior side each of her quarter size, dark brown to black color with soft top and significant tenderness on gentle touch.  Some serosanguineous fluid is oozing from his lesions today. NEUROLOGIC: Cranial nerves II through XII are intact. Muscle strength 5/5 in all extremities. Sensation intact. Gait not checked.  PSYCHIATRIC: The patient is alert and oriented x 3.  SKIN: No obvious rash, lesion, or ulcer.     Physical Exam LABORATORY PANEL:   CBC Recent Labs  Lab 10/10/17 0015  WBC 12.5*  HGB 8.6*  HCT 25.3*  PLT 212   ------------------------------------------------------------------------------------------------------------------  Chemistries  Recent Labs  Lab 10/08/17 1611 10/09/17 0728  NA 133* 136  K 3.6 3.6  CL 97* 101  CO2 28 29  GLUCOSE 117* 100*  BUN 10 10  CREATININE 0.95 0.92  CALCIUM 8.5* 8.1*  AST 14*  --   ALT 12  --   ALKPHOS 54  --   BILITOT 0.6  --    ------------------------------------------------------------------------------------------------------------------  Cardiac Enzymes No results for input(s): TROPONINI in the last 168 hours. ------------------------------------------------------------------------------------------------------------------  RADIOLOGY:  Ct Femur Left W Contrast  Result Date: 10/08/2017 CLINICAL DATA:  Left lower extremity cellulitis and possible necrotizing fasciitis. EXAM: CT OF THE LOWER RIGHT EXTREMITY WITH CONTRAST TECHNIQUE: Multidetector CT imaging of the lower right extremity was performed according to the standard protocol following intravenous contrast administration. COMPARISON:  None. CONTRAST:  16mL ISOVUE-300 IOPAMIDOL (ISOVUE-300) INJECTION 61% FINDINGS: Mild inflammatory stranding of the subcutaneous tissues of the left thigh is noted anteriorly and medially suggesting  cellulitis. No  definite abscess or fluid collection is noted. No definite bony abnormality or fracture is noted involving the visualized bone. 3.7 cm left external iliac lymph node is noted consistent with metastatic disease. Probable filling defect is seen in the left common femoral and external iliac vein concerning for thrombus. Potentially this may extend into the left superficial femoral vein. IMPRESSION: Mild inflammatory stranding of the subcutaneous tissues of the left thigh is noted anteriorly and medially suggesting cellulitis or possibly edema. No abscess or fluid collection is noted. Filling defect is noted in the visualized portion of the left external iliac vein and common femoral vein suggesting deep venous thrombosis. Potentially this may extended to the left superficial femoral vein and further evaluation with Doppler ultrasound is recommended. These results will be called to the ordering clinician or representative by the Radiologist Assistant, and communication documented in the PACS or zVision Dashboard. 3.7 cm left external iliac lymph node is noted consistent with metastatic disease. Electronically Signed   By: Marijo Conception, M.D.   On: 10/08/2017 22:24   US Venous Img Lower Unilateral Left  Result Date: 10/09/2017 CLINICAL DATA:  Left lower extremity cellulitis with CT demonstrating possible DVT in the common femoral and external iliac veins. EXAM: LEFT LOWER EXTREMITY VENOUS DOPPLER ULTRASOUND TECHNIQUE: Gray-scale sonography with graded compression, as well as color Doppler and duplex ultrasound were performed to evaluate the lower extremity deep venous systems from the level of the common femoral vein and including the common femoral, femoral, profunda femoral, popliteal and calf veins including the posterior tibial, peroneal and gastrocnemius veins when visible. The superficial great saphenous vein was also interrogated. Spectral Doppler was utilized to evaluate flow at rest and with  distal augmentation maneuvers in the common femoral, femoral and popliteal veins. COMPARISON:  None. FINDINGS: Contralateral Common Femoral Vein: Respiratory phasicity is normal and symmetric with the symptomatic side. No evidence of thrombus. Normal compressibility. Common Femoral Vein: No evidence of thrombus. Normal compressibility, respiratory phasicity and response to augmentation. Saphenofemoral Junction: No evidence of thrombus. Normal compressibility and flow on color Doppler imaging. Profunda Femoral Vein: No evidence of thrombus. Normal compressibility and flow on color Doppler imaging. Femoral Vein: No evidence of thrombus. Normal compressibility, respiratory phasicity and response to augmentation. Popliteal Vein: No evidence of thrombus. Normal compressibility, respiratory phasicity and response to augmentation. Calf Veins: No evidence of thrombus. Normal compressibility and flow on color Doppler imaging. Superficial Great Saphenous Vein: No evidence of thrombus. Normal compressibility. Venous Reflux:  None. Other Findings: No evidence of superficial thrombophlebitis or abnormal fluid collection. IMPRESSION: No evidence of left lower extremity deep venous thrombosis. CT findings are felt to likely relate to streaming of contrast during early opacification of the venous system. If there is any progressive edema of the left lower extremity, follow-up duplex ultrasound may be helpful. Electronically Signed   By: Aletta Edouard M.D.   On: 10/09/2017 15:27   Dg Femur Min 2 Views Left  Result Date: 10/08/2017 CLINICAL DATA:  Fever with left leg swelling. EXAM: LEFT FEMUR 2 VIEWS COMPARISON:  None. FINDINGS: There is no evidence of fracture or other focal bone lesions. Diffuse soft tissue swelling. No subcutaneous emphysema. IMPRESSION: Diffuse soft tissue swelling.  No subcutaneous emphysema. Electronically Signed   By: Titus Dubin M.D.   On: 10/08/2017 17:11    ASSESSMENT AND PLAN:   Principal  Problem:   Cellulitis Active Problems:   Normocytic anemia  *Cellulitis on leg and thigh Rapidly worsening cellulitis up to his  thigh. Appreciated surgical help, no necrotizing fasciitis, no acute surgical intervention needed. I will give him cefepime plus vancomycin plus clindamycin IV for now. Tylenol as needed for fever.  Oxycodone oral for pain control and morphine IV for breakthrough pain. I will hold his Xarelto and keep on heparin IV drip, if he need to undergo surgery. As his swelling is still persistent and now has some serosanguineous discharge, we will do a repeat CT scan on his lower extremity today.  * sepsis   As above  *DVT on left leg Continue heparin drip for now. As surgery recommended, ultrasound lower extremity DVT study was done which was negative. As per patient's mother in the room today, his thrombosis was mostly in the pelvis and intra-abdominal, spoke to vascular surgery and ordered CT venogram on abdomen and pelvis.  * testicular cancer   Follows at cancer center  Appreciated help by oncology.  *Active smoking Counseled to quit smoking for 4 minutes and offered nicotine patch.      All the records are reviewed and case discussed with Care Management/Social Workerr. Management plans discussed with the patient, family and they are in agreement.  CODE STATUS: Full.  TOTAL TIME TAKING CARE OF THIS PATIENT:35 minutes.     POSSIBLE D/C IN 1-2 DAYS, DEPENDING ON CLINICAL CONDITION.   Vaughan Basta M.D on 10/10/2017   Between 7am to 6pm - Pager - [phone]  After 6pm go to [email or web address] - password [email or web address] [facility]  Office  [phone]  CC: Primary care physician; Patient, No Pcp Per  Note: This dictation was prepared with Dragon dictation along with smaller phrase technology. Any transcriptional errors that result from this process are unintentional.

## 2017-10-10 NOTE — Progress Notes (Signed)
ANTICOAGULATION CONSULT NOTE - Initial Consult  Pharmacy Consult for Heparin drip Indication: DVT  No Known Allergies  Patient Measurements: Height: 6\' 3"  (190.5 cm) Weight: 268 lb 1.6 oz (121.6 kg) IBW/kg (Calculated) : 84.5 Heparin Dosing Weight: 110 kg  Vital Signs: Temp: 97.7 F (36.5 C) (07/28 0411) Temp Source: Oral (07/28 0411) BP: 123/77 (07/28 0411) Pulse Rate: 85 (07/28 0411)  Labs: Recent Labs    10/08/17 1611 10/09/17 0728 10/09/17 1536 10/10/17 0015 10/10/17 0753  HGB 8.9* 8.9*  --  8.6*  --   HCT 26.3* 26.3*  --  25.3*  --   PLT 137* 170  --  212  --   APTT 44* 48* 56* 58* 59*  LABPROT 20.9*  --   --   --   --   INR 1.82  --   --   --   --   HEPARINUNFRC 3.16* 0.40  --  0.16*  --   CREATININE 0.95 0.92  --   --   --     Estimated Creatinine Clearance: 166.4 mL/min (by C-G formula based on SCr of 0.92 mg/dL).   Medical History: Past Medical History:  Diagnosis Date  . Asthma    AS A CHILD-NO INHALERS  . Cancer ([place])    testicular cancer 11/2016 per pt   . GERD (gastroesophageal reflux disease)    TUMS PRN    Medications:  Patient was taking Xarelto prior to admission for DVT. Last dose was on 10/07/17 but note states that patient was unable to keep down.  Assessment: Patient is a 27yo male admitted for LE cellulitis. Pharmacy consulted for transition to Heparin infusion. Baseline Heparin level is elevated at 3.16 so will need to use aPTT to guide therapy  Heparin level 0.40; however APTT 48   Goal of Therapy:  APTT 66 - 102 seconds Heparin level 0.3-0.7 units/ml  Monitor platelets by anticoagulation protocol: Yes   Plan:  0727 1600 aPTT 56. Level is subtherapeutic. Will order 1500 unit bolus and increase heparin drip to 1900 units/hr. Recheck aPTT and HL in 6 hours.   7/27 0000 aPTT 58, heparin level 0.16. Increase to 2000 units/hr and recheck aPTT in 6 hours.  7/28 APTT 59. Will give Heparin bolus of 1500 units x 1 and will increase  heparin gtt to 2300 units/hr.    Larene Beach, PharmD, BCPS Clinical Pharmacist 10/10/2017 11:31 AM

## 2017-10-10 NOTE — Progress Notes (Addendum)
ANTICOAGULATION CONSULT NOTE - Initial Consult  Pharmacy Consult for Heparin drip Indication: DVT  No Known Allergies  Patient Measurements: Height: 6\' 3"  (190.5 cm) Weight: 268 lb 1.6 oz (121.6 kg) IBW/kg (Calculated) : 84.5 Heparin Dosing Weight: 110 kg  Vital Signs: Temp: 98.1 F (36.7 C) (07/27 1932) Temp Source: Oral (07/27 1932) BP: 123/74 (07/27 1932) Pulse Rate: 98 (07/27 1932)  Labs: Recent Labs    10/08/17 1611 10/09/17 0728 10/09/17 1536 10/10/17 0015  HGB 8.9* 8.9*  --  8.6*  HCT 26.3* 26.3*  --  25.3*  PLT 137* 170  --  212  APTT 44* 48* 56* 58*  LABPROT 20.9*  --   --   --   INR 1.82  --   --   --   HEPARINUNFRC 3.16* 0.40  --  0.16*  CREATININE 0.95 0.92  --   --     Estimated Creatinine Clearance: 166.4 mL/min (by C-G formula based on SCr of 0.92 mg/dL).   Medical History: Past Medical History:  Diagnosis Date  . Asthma    AS A CHILD-NO INHALERS  . Cancer ([place])    testicular cancer 11/2016 per pt   . GERD (gastroesophageal reflux disease)    TUMS PRN    Medications:  Patient was taking Xarelto prior to admission for DVT. Last dose was on 10/07/17 but note states that patient was unable to keep down.  Assessment: Patient is a 27yo male admitted for LE cellulitis. Pharmacy consulted for transition to Heparin infusion. Baseline Heparin level is elevated at 3.16 so will need to use aPTT to guide therapy  Heparin level 0.40; however APTT 48   Goal of Therapy:  APTT 66 - 102 seconds Heparin level 0.3-0.7 units/ml  Monitor platelets by anticoagulation protocol: Yes   Plan:  0727 1600 aPTT 56. Level is subtherapeutic. Will order 1500 unit bolus and increase heparin drip to 1900 units/hr. Recheck aPTT and HL in 6 hours.   7/27 0000 aPTT 58, heparin level 0.16. Increase to 2000 units/hr and recheck aPTT in 6 hours.  Eloise Harman, PharmD, BCPS Clinical Pharmacist 10/10/2017 1:32 AM

## 2017-10-10 NOTE — Progress Notes (Signed)
Patients pain controlled this shift alternating with Oxy IR and morphine IV. Hep gtt infusing as ordered. Blisters to left upper leg now draining. MD made aware. Wound consult placed. Madlyn Frankel, RN

## 2017-10-10 NOTE — Progress Notes (Signed)
ANTICOAGULATION CONSULT NOTE - Initial Consult  Pharmacy Consult for Heparin drip Indication: DVT  No Known Allergies  Patient Measurements: Height: 6\' 3"  (190.5 cm) Weight: 268 lb 1.6 oz (121.6 kg) IBW/kg (Calculated) : 84.5 Heparin Dosing Weight: 110 kg  Vital Signs:    Labs: Recent Labs    10/08/17 1611 10/09/17 0728  10/10/17 0015 10/10/17 0753 10/10/17 1849  HGB 8.9* 8.9*  --  8.6*  --   --   HCT 26.3* 26.3*  --  25.3*  --   --   PLT 137* 170  --  212  --   --   APTT 44* 48*   < > 58* 59* 73*  LABPROT 20.9*  --   --   --   --   --   INR 1.82  --   --   --   --   --   HEPARINUNFRC 3.16* 0.40  --  0.16*  --   --   CREATININE 0.95 0.92  --   --   --   --    < > = values in this interval not displayed.    Estimated Creatinine Clearance: 166.4 mL/min (by C-G formula based on SCr of 0.92 mg/dL).   Medical History: Past Medical History:  Diagnosis Date  . Asthma    AS A CHILD-NO INHALERS  . Cancer ([place])    testicular cancer 11/2016 per pt   . GERD (gastroesophageal reflux disease)    TUMS PRN    Medications:  Patient was taking Xarelto prior to admission for DVT. Last dose was on 10/07/17 but note states that patient was unable to keep down.  Assessment: Patient is a 27yo male admitted for LE cellulitis. Pharmacy consulted for transition to Heparin infusion. Baseline Heparin level is elevated at 3.16 so will need to use aPTT to guide therapy  Heparin level 0.40; however APTT 48   Goal of Therapy:  APTT 66 - 102 seconds Heparin level 0.3-0.7 units/ml  Monitor platelets by anticoagulation protocol: Yes   Plan:  0727 1600 aPTT 56. Level is subtherapeutic. Will order 1500 unit bolus and increase heparin drip to 1900 units/hr. Recheck aPTT and HL in 6 hours.   7/27 0000 aPTT 58, heparin level 0.16. Increase to 2000 units/hr and recheck aPTT in 6 hours.  7/28 APTT 59. Will give Heparin bolus of 1500 units x 1 and will increase heparin gtt to 2300 units/hr.    7/28 APTT 73. Level is therapeutic x 1 dose. Will continue current heparin rate of 2300units/hr and recheck HL and aPTT in 6 hours.    Pernell Dupre, PharmD, BCPS Clinical Pharmacist 10/10/2017 7:21 PM

## 2017-10-11 DIAGNOSIS — D649 Anemia, unspecified: Secondary | ICD-10-CM

## 2017-10-11 DIAGNOSIS — I871 Compression of vein: Secondary | ICD-10-CM

## 2017-10-11 DIAGNOSIS — L03116 Cellulitis of left lower limb: Secondary | ICD-10-CM

## 2017-10-11 DIAGNOSIS — M79605 Pain in left leg: Secondary | ICD-10-CM

## 2017-10-11 DIAGNOSIS — C6292 Malignant neoplasm of left testis, unspecified whether descended or undescended: Secondary | ICD-10-CM

## 2017-10-11 DIAGNOSIS — Z7901 Long term (current) use of anticoagulants: Secondary | ICD-10-CM

## 2017-10-11 DIAGNOSIS — G893 Neoplasm related pain (acute) (chronic): Secondary | ICD-10-CM

## 2017-10-11 DIAGNOSIS — I82402 Acute embolism and thrombosis of unspecified deep veins of left lower extremity: Secondary | ICD-10-CM

## 2017-10-11 LAB — COMPREHENSIVE METABOLIC PANEL
ALK PHOS: 49 U/L (ref 38–126)
ALT: 21 U/L (ref 0–44)
AST: 16 U/L (ref 15–41)
Albumin: 2.6 g/dL — ABNORMAL LOW (ref 3.5–5.0)
Anion gap: 9 (ref 5–15)
BILIRUBIN TOTAL: 0.6 mg/dL (ref 0.3–1.2)
BUN: 10 mg/dL (ref 6–20)
CALCIUM: 8.2 mg/dL — AB (ref 8.9–10.3)
CHLORIDE: 98 mmol/L (ref 98–111)
CO2: 27 mmol/L (ref 22–32)
CREATININE: 0.81 mg/dL (ref 0.61–1.24)
Glucose, Bld: 103 mg/dL — ABNORMAL HIGH (ref 70–99)
Potassium: 3.4 mmol/L — ABNORMAL LOW (ref 3.5–5.1)
Sodium: 134 mmol/L — ABNORMAL LOW (ref 135–145)
Total Protein: 5.6 g/dL — ABNORMAL LOW (ref 6.5–8.1)

## 2017-10-11 LAB — CBC
HEMATOCRIT: 24 % — AB (ref 40.0–52.0)
HEMOGLOBIN: 8.7 g/dL — AB (ref 13.0–18.0)
MCH: 31.9 pg (ref 26.0–34.0)
MCHC: 36.2 g/dL — ABNORMAL HIGH (ref 32.0–36.0)
MCV: 88.2 fL (ref 80.0–100.0)
Platelets: 289 10*3/uL (ref 150–440)
RBC: 2.73 MIL/uL — AB (ref 4.40–5.90)
RDW: 12.6 % (ref 11.5–14.5)
WBC: 14 10*3/uL — AB (ref 3.8–10.6)

## 2017-10-11 LAB — APTT: APTT: 96 s — AB (ref 24–36)

## 2017-10-11 LAB — HEPARIN LEVEL (UNFRACTIONATED)
HEPARIN UNFRACTIONATED: 0.27 [IU]/mL — AB (ref 0.30–0.70)
HEPARIN UNFRACTIONATED: 0.5 [IU]/mL (ref 0.30–0.70)
Heparin Unfractionated: 0.26 IU/mL — ABNORMAL LOW (ref 0.30–0.70)
Heparin Unfractionated: 0.43 IU/mL (ref 0.30–0.70)

## 2017-10-11 LAB — VANCOMYCIN, TROUGH: Vancomycin Tr: 14 ug/mL — ABNORMAL LOW (ref 15–20)

## 2017-10-11 LAB — CULTURE, BLOOD (ROUTINE X 2)
CULTURE: NO GROWTH
SPECIAL REQUESTS: ADEQUATE

## 2017-10-11 MED ORDER — HEPARIN BOLUS VIA INFUSION
1650.0000 [IU] | Freq: Once | INTRAVENOUS | Status: AC
  Administered 2017-10-11: 11:00:00 1650 [IU] via INTRAVENOUS
  Filled 2017-10-11: qty 1650

## 2017-10-11 NOTE — Progress Notes (Signed)
ANTICOAGULATION CONSULT NOTE - Initial Consult  Pharmacy Consult for Heparin drip Indication: DVT  No Known Allergies  Patient Measurements: Height: 6\' 3"  (190.5 cm) Weight: 268 lb 1.6 oz (121.6 kg) IBW/kg (Calculated) : 84.5 Heparin Dosing Weight: 110 kg  Vital Signs: Temp: 98.7 F (37.1 C) (07/28 1952) Temp Source: Oral (07/28 1952) BP: 111/71 (07/28 1952) Pulse Rate: 92 (07/28 1952)  Labs: Recent Labs    10/08/17 1611 10/09/17 0728  10/10/17 0015 10/10/17 0753 10/10/17 1849 10/11/17 0104  HGB 8.9* 8.9*  --  8.6*  --   --  8.7*  HCT 26.3* 26.3*  --  25.3*  --   --  24.0*  PLT 137* 170  --  212  --   --  289  APTT 44* 48*   < > 58* 59* 73* 96*  LABPROT 20.9*  --   --   --   --   --   --   INR 1.82  --   --   --   --   --   --   HEPARINUNFRC 3.16* 0.40  --  0.16*  --   --  0.27*  CREATININE 0.95 0.92  --   --   --   --  0.81   < > = values in this interval not displayed.    Estimated Creatinine Clearance: 189 mL/min (by C-G formula based on SCr of 0.81 mg/dL).   Medical History: Past Medical History:  Diagnosis Date  . Asthma    AS A CHILD-NO INHALERS  . Cancer ([place])    testicular cancer 11/2016 per pt   . GERD (gastroesophageal reflux disease)    TUMS PRN    Medications:  Patient was taking Xarelto prior to admission for DVT. Last dose was on 10/07/17 but note states that patient was unable to keep down.  Assessment: Patient is a 27yo male admitted for LE cellulitis. Pharmacy consulted for transition to Heparin infusion. Baseline Heparin level is elevated at 3.16 so will need to use aPTT to guide therapy  Heparin level 0.40; however APTT 48   Goal of Therapy:  APTT 66 - 102 seconds Heparin level 0.3-0.7 units/ml  Monitor platelets by anticoagulation protocol: Yes   Plan:  0727 1600 aPTT 56. Level is subtherapeutic. Will order 1500 unit bolus and increase heparin drip to 1900 units/hr. Recheck aPTT and HL in 6 hours.   7/27 0000 aPTT 58, heparin  level 0.16. Increase to 2000 units/hr and recheck aPTT in 6 hours.  7/28 APTT 59. Will give Heparin bolus of 1500 units x 1 and will increase heparin gtt to 2300 units/hr.   7/28 APTT 73. Level is therapeutic x 1 dose. Will continue current heparin rate of 2300units/hr and recheck HL and aPTT in 6 hours.   7/29 0100 aPTT 96, however heparin level is still subtherapeutic at 0.27. Increase to 2500 units/hr and recheck aPTT/HL in 6 hours.   Eloise Harman, PharmD, BCPS Clinical Pharmacist 10/11/2017 3:03 AM

## 2017-10-11 NOTE — Consult Note (Signed)
[facility] Nurse wound consult note Reason for Consult: Edema to left leg thrombosis to pelvis and intraabdominal area and left leg involvement.  Wound type: Edema with blistering to left anterior and medial thigh  Weeping serous fluid, maroon discoloration to base.  Pressure Injury POA: NA Measurement: Scattered blisters.  2 cm x 2 cm  And 1 cm x 1 cm  Wound YTM:MITVIF discoloration although appear to be serum filled and not blood filled Drainage (amount, consistency, odor) serous weeping Periwound:slight erythema Dressing procedure/placement/frequency: Cleanse wounds to left thigh with soap and water and gently pat dry.  Will begin Xeroform to blisters.  Cover with kerlix and tape/  Change daily.  Will not follow at this time.  Please re-consult if needed.  Domenic Moras RN BSN [place] Pager [phone]

## 2017-10-11 NOTE — Progress Notes (Signed)
[facility] NAME: Edgar [name]    MR#:  [id number]  DATE OF BIRTH:  1990-06-15  SUBJECTIVE:  CHIEF COMPLAINT:  No chief complaint on file.  Admitted for left thigh cellulitis.  Found to have left iliac wing thrombus. On heparin drip.  REVIEW OF SYSTEMS:  CONSTITUTIONAL: No fever, fatigue or weakness.  EYES: No blurred or double vision.  EARS, NOSE, AND THROAT: No tinnitus or ear pain.  RESPIRATORY: No cough, shortness of breath, wheezing or hemoptysis.  CARDIOVASCULAR: No chest pain, orthopnea, edema.  GASTROINTESTINAL: No nausea, vomiting, diarrhea or abdominal pain.  GENITOURINARY: No dysuria, hematuria.  ENDOCRINE: No polyuria, nocturia,  HEMATOLOGY: No anemia, easy bruising or bleeding SKIN: No rash or lesion. MUSCULOSKELETAL: No joint pain or arthritis.  Left thigh and leg swelling. NEUROLOGIC: No tingling, numbness, weakness.  PSYCHIATRY: No anxiety or depression.   ROS  DRUG ALLERGIES:  No Known Allergies  VITALS:  Blood pressure 118/78, pulse 89, temperature 98.5 F (36.9 C), temperature source Oral, resp. rate 20, height 6\' 3"  (1.905 m), weight 121.6 kg (268 lb 1.6 oz), SpO2 95 %.  PHYSICAL EXAMINATION:   GENERAL:  27 y.o.-year-old patient lying in the bed with no acute distress.  EYES: Pupils equal, round, reactive to light and accommodation. No scleral icterus. Extraocular muscles intact.  HEENT: Head atraumatic, normocephalic. Oropharynx and nasopharynx clear.  NECK:  Supple, no jugular venous distention. No thyroid enlargement, no tenderness.  LUNGS: Normal breath sounds bilaterally, no wheezing, rales,rhonchi or crepitation. No use of accessory muscles of respiration.  CARDIOVASCULAR: S1, S2 normal. No murmurs, rubs, or gallops.  ABDOMEN: Soft, nontender, nondistended. Bowel sounds present. No organomegaly or mass.  EXTREMITIES: Left leg and thigh are swollen.  Left thigh dressing and surrounding  erythema NEUROLOGIC: Cranial nerves II through XII are intact. Muscle strength 5/5 in all extremities. Sensation intact. Gait not checked.  PSYCHIATRIC: The patient is alert and oriented x 3.  SKIN: No obvious rash, lesion, or ulcer.   Physical Exam LABORATORY PANEL:   CBC Recent Labs  Lab 10/11/17 0104  WBC 14.0*  HGB 8.7*  HCT 24.0*  PLT 289   ------------------------------------------------------------------------------------------------------------------  Chemistries  Recent Labs  Lab 10/11/17 0104  NA 134*  K 3.4*  CL 98  CO2 27  GLUCOSE 103*  BUN 10  CREATININE 0.81  CALCIUM 8.2*  AST 16  ALT 21  ALKPHOS 49  BILITOT 0.6   ------------------------------------------------------------------------------------------------------------------  Cardiac Enzymes No results for input(s): TROPONINI in the last 168 hours. ------------------------------------------------------------------------------------------------------------------  RADIOLOGY:  Ct Extremity Lower Left W Contrast  Result Date: 10/10/2017 CLINICAL DATA:  History of testicular cancer with LEFT LOWER EXTREMITY PAIN AND SWELLING. EXAM: CT ABDOMEN/PELVIS WITH CONTRAST. CT OF THE LOWER LEFT EXTREMITY WITH CONTRAST TECHNIQUE: Multidetector CT imaging of the abdomen and pelvis was performed using the standard protocol following bolus administration of intravenous contrast. CT examination of the left lower extremity was also performed. CONTRAST:  154mL ISOVUE-300 IOPAMIDOL (ISOVUE-300) INJECTION 61% COMPARISON:  Prior CT scan from 2 days ago. FINDINGS: Lower chest: The lung bases are clear of acute process. Minimal dependent bibasilar atelectasis. No pleural effusion or pulmonary lesions. The heart is normal in size. No pericardial effusion. The distal esophagus and aorta are unremarkable. Hepatobiliary: No focal hepatic lesions or intrahepatic biliary dilatation. The gallbladder is mildly contracted. No common bile  duct dilatation. Pancreas: No mass, inflammation or ductal dilatation. Spleen: Normal size.  No focal lesions. Adrenals/Urinary Tract:  The adrenal glands and kidneys are unremarkable. No renal, ureteral or bladder calculi or mass. Stomach/Bowel: The stomach, duodenum, small bowel and colon are unremarkable. No acute inflammatory changes, mass lesions or obstructive findings. The terminal ileum and appendix are normal. Vascular/Lymphatic: The aorta and branch vessels are normal. The IVC is patent. There is retroperitoneal lymphadenopathy mainly in the left para-aortic region. 11.5 mm lymph node noted on image number 54. 17.5 mm left para-aortic lymph node on image number 66. 16 mm left common iliac lymph node on image number 74. Again demonstrated is a large necrotic appearing nodal mass in the medial left external iliac region. This measures a maximum of 3.7 cm and adjacent to this the left external iliac vein is thrombosed. Thrombus extends down into the common femoral vein to just above the profundus femoral vein. The right-sided venous structures are patent. No deep venous thrombosis is identified in the left thigh. The femoral and popliteal veins are patent. Reproductive: The prostate gland and seminal vesicles are unremarkable. Other: No intrapelvic or intra-abdominal fluid collections. Musculoskeletal: No significant bony findings. LEFT LOWER EXTREMITY: Extensive subcutaneous soft tissue swelling/edema/fluid involving the entire imaged left thigh with associated skin thickening and areas of skin blistering. This could be due to the deep venous thrombosis and or cellulitis. Is also diffuse edema in the muscular compartments of the thigh consistent with myofasciitis. No discrete abscess or findings to suggest pyomyositis. No knee or hip joint effusions to suggest septic arthritis. Enlarged left axillary lymph nodes likely inflammatory. IMPRESSION: 1. Left-sided retroperitoneal lymphadenopathy and left pelvic  lymphadenopathy likely metastatic testicular cancer. The large necrotic appearing left medial external iliac lymph node is likely compressing the left external iliac vein and causing deep venous thrombosis extending down into the left groin. This does not extend down into the left thigh. 2. Marked and diffuse subcutaneous soft tissue swelling/edema/fluid along with skin thickening and skin blisters involving the left thigh. This could be due to the deep venous thrombosis or possibly could be secondary cellulitis and myofasciitis. No discrete drainable abscess is identified. 3. No definite CT findings for septic arthritis or osteomyelitis and no findings for metastatic disease involving the lung bases or bony structures. Electronically Signed   By: Marijo Sanes M.D.   On: 10/10/2017 15:28   Ct Venogram Abd/pel  Result Date: 10/10/2017 CLINICAL DATA:  History of testicular cancer with LEFT LOWER EXTREMITY PAIN AND SWELLING. EXAM: CT ABDOMEN/PELVIS WITH CONTRAST. CT OF THE LOWER LEFT EXTREMITY WITH CONTRAST TECHNIQUE: Multidetector CT imaging of the abdomen and pelvis was performed using the standard protocol following bolus administration of intravenous contrast. CT examination of the left lower extremity was also performed. CONTRAST:  174mL ISOVUE-300 IOPAMIDOL (ISOVUE-300) INJECTION 61% COMPARISON:  Prior CT scan from 2 days ago. FINDINGS: Lower chest: The lung bases are clear of acute process. Minimal dependent bibasilar atelectasis. No pleural effusion or pulmonary lesions. The heart is normal in size. No pericardial effusion. The distal esophagus and aorta are unremarkable. Hepatobiliary: No focal hepatic lesions or intrahepatic biliary dilatation. The gallbladder is mildly contracted. No common bile duct dilatation. Pancreas: No mass, inflammation or ductal dilatation. Spleen: Normal size.  No focal lesions. Adrenals/Urinary Tract: The adrenal glands and kidneys are unremarkable. No renal, ureteral or  bladder calculi or mass. Stomach/Bowel: The stomach, duodenum, small bowel and colon are unremarkable. No acute inflammatory changes, mass lesions or obstructive findings. The terminal ileum and appendix are normal. Vascular/Lymphatic: The aorta and branch vessels are normal. The IVC is  patent. There is retroperitoneal lymphadenopathy mainly in the left para-aortic region. 11.5 mm lymph node noted on image number 54. 17.5 mm left para-aortic lymph node on image number 66. 16 mm left common iliac lymph node on image number 74. Again demonstrated is a large necrotic appearing nodal mass in the medial left external iliac region. This measures a maximum of 3.7 cm and adjacent to this the left external iliac vein is thrombosed. Thrombus extends down into the common femoral vein to just above the profundus femoral vein. The right-sided venous structures are patent. No deep venous thrombosis is identified in the left thigh. The femoral and popliteal veins are patent. Reproductive: The prostate gland and seminal vesicles are unremarkable. Other: No intrapelvic or intra-abdominal fluid collections. Musculoskeletal: No significant bony findings. LEFT LOWER EXTREMITY: Extensive subcutaneous soft tissue swelling/edema/fluid involving the entire imaged left thigh with associated skin thickening and areas of skin blistering. This could be due to the deep venous thrombosis and or cellulitis. Is also diffuse edema in the muscular compartments of the thigh consistent with myofasciitis. No discrete abscess or findings to suggest pyomyositis. No knee or hip joint effusions to suggest septic arthritis. Enlarged left axillary lymph nodes likely inflammatory. IMPRESSION: 1. Left-sided retroperitoneal lymphadenopathy and left pelvic lymphadenopathy likely metastatic testicular cancer. The large necrotic appearing left medial external iliac lymph node is likely compressing the left external iliac vein and causing deep venous thrombosis  extending down into the left groin. This does not extend down into the left thigh. 2. Marked and diffuse subcutaneous soft tissue swelling/edema/fluid along with skin thickening and skin blisters involving the left thigh. This could be due to the deep venous thrombosis or possibly could be secondary cellulitis and myofasciitis. No discrete drainable abscess is identified. 3. No definite CT findings for septic arthritis or osteomyelitis and no findings for metastatic disease involving the lung bases or bony structures. Electronically Signed   By: Marijo Sanes M.D.   On: 10/10/2017 15:28    ASSESSMENT AND PLAN:   Principal Problem:   Cellulitis Active Problems:   Normocytic anemia  *Cellulitis on leg and thigh with sepsis Still has significant redness and swelling.  On vancomycin and cefepime.  Will stop clindamycin. Appreciated surgical help, no necrotizing fasciitis, no acute surgical intervention needed. As needed pain medications Xarelto held and on heparin drip in anticipation of needing interventions. CT scan of the leg showed no abscess.  *DVT on left leg Continue heparin drip for now. CT venogram showed left external iliac vein thrombus  * Testicular cancer  Follows at cancer center Waiting for oncology input by Dr. Mike Gip  * Smoking Counseled earlier in the admission  All the records are reviewed and case discussed with Care Management/Social Workerr. Management plans discussed with the patient, family and they are in agreement.  CODE STATUS: Full.  TOTAL TIME TAKING CARE OF THIS PATIENT:35 minutes.   POSSIBLE D/C IN 1-2 DAYS, DEPENDING ON CLINICAL CONDITION.  Neita Carp M.D on 10/11/2017   Between 7am to 6pm - Pager - [phone]  After 6pm go to [email or web address] - password [email or web address] [facility]  Office  [phone]  CC: Primary care physician; Patient, No Pcp Per  Note: This dictation was prepared with Dragon dictation along with  smaller phrase technology. Any transcriptional errors that result from this process are unintentional.

## 2017-10-11 NOTE — Progress Notes (Signed)
ANTICOAGULATION CONSULT NOTE  Pharmacy Consult for Heparin drip Indication: DVT  No Known Allergies  Patient Measurements: Height: 6\' 3"  (190.5 cm) Weight: 268 lb 1.6 oz (121.6 kg) IBW/kg (Calculated) : 84.5 Heparin Dosing Weight: 110 kg  Vital Signs: Temp: 98.5 F (36.9 C) (07/29 1419) Temp Source: Oral (07/29 1419) BP: 118/78 (07/29 1419) Pulse Rate: 89 (07/29 1419)  Labs: Recent Labs    10/09/17 0728  10/10/17 0015 10/10/17 0753 10/10/17 1849 10/11/17 0104 10/11/17 0857 10/11/17 1700  HGB 8.9*  --  8.6*  --   --  8.7*  --   --   HCT 26.3*  --  25.3*  --   --  24.0*  --   --   PLT 170  --  212  --   --  289  --   --   APTT 48*   < > 58* 59* 73* 96*  --   --   HEPARINUNFRC 0.40  --  0.16*  --   --  0.27* 0.26* 0.50  CREATININE 0.92  --   --   --   --  0.81  --   --    < > = values in this interval not displayed.    Estimated Creatinine Clearance: 189 mL/min (by C-G formula based on SCr of 0.81 mg/dL).   Medical History: Past Medical History:  Diagnosis Date  . Asthma    AS A CHILD-NO INHALERS  . Cancer ([place])    testicular cancer 11/2016 per pt   . GERD (gastroesophageal reflux disease)    TUMS PRN    Medications:  Patient was taking Xarelto prior to admission for DVT. Last dose was on 10/07/17 but note states that patient was unable to keep down.  Assessment: Patient is a 27yo male admitted for LE cellulitis. Pharmacy consulted for transition to Heparin infusion. Baseline Heparin level is elevated at 3.16 so will need to use aPTT to guide therapy    Goal of Therapy:  Heparin level 0.3-0.7 units/ml  Monitor platelets by anticoagulation protocol: Yes   Plan:  Will bolus heparin 1650 units IV once and increase infusion to 2750 units/hr. Wil recheck a HL in 6 hours.   10/11/17 17:00 HL therapeutic x 1. Continue current rate. Will recheck HL in 6 hours.  Laural Benes, PharmD, BCPS Clinical Pharmacist 10/11/2017 5:23 PM

## 2017-10-11 NOTE — Progress Notes (Signed)
Patient ID: Edgar [name], male   DOB: [birth year], 27 y.o.   MRN: [id number]     [place] Hospital Day(s): 3.   Post op day(s):  Marland Kitchen   Interval History: Patient seen and examined, no acute events or new complaints overnight. Patient reports minimal improvement of the thigh pain.  Vital signs in last 24 hours: [min-max] current  Temp:  [98.5 F (36.9 C)-98.9 F (37.2 C)] 98.5 F (36.9 C) (07/29 1419) Pulse Rate:  [78-92] 89 (07/29 1419) Resp:  [14-20] 20 (07/29 1419) BP: (111-119)/(71-78) 118/78 (07/29 1419) SpO2:  [95 %-97 %] 95 % (07/29 1419)     Height: 6\' 3"  (190.5 cm) Weight: 121.6 kg (268 lb 1.6 oz) BMI (Calculated): 33.51   Physical Exam:  Constitutional: alert, cooperative and no distress  Extremity: left thigh severe edema with multiple ischemic bullae  Labs:  CBC Latest Ref Rng & Units 10/11/2017 10/10/2017 10/09/2017  WBC 3.8 - 10.6 K/uL 14.0(H) 12.5(H) 10.9(H)  Hemoglobin 13.0 - 18.0 g/dL 8.7(L) 8.6(L) 8.9(L)  Hematocrit 40.0 - 52.0 % 24.0(L) 25.3(L) 26.3(L)  Platelets 150 - 440 K/uL 289 212 170   CMP Latest Ref Rng & Units 10/11/2017 10/09/2017 10/08/2017  Glucose 70 - 99 mg/dL 103(H) 100(H) 117(H)  BUN 6 - 20 mg/dL 10 10 10   Creatinine 0.61 - 1.24 mg/dL 0.81 0.92 0.95  Sodium 135 - 145 mmol/L 134(L) 136 133(L)  Potassium 3.5 - 5.1 mmol/L 3.4(L) 3.6 3.6  Chloride 98 - 111 mmol/L 98 101 97(L)  CO2 22 - 32 mmol/L 27 29 28   Calcium 8.9 - 10.3 mg/dL 8.2(L) 8.1(L) 8.5(L)  Total Protein 6.5 - 8.1 g/dL 5.6(L) - 6.4(L)  Total Bilirubin 0.3 - 1.2 mg/dL 0.6 - 0.6  Alkaline Phos 38 - 126 U/L 49 - 54  AST 15 - 41 U/L 16 - 14(L)  ALT 0 - 44 U/L 21 - 12   Imaging studies:  EXAM: CT ABDOMEN/PELVIS WITH CONTRAST. CT OF THE LOWER LEFT EXTREMITY WITH CONTRAST  TECHNIQUE: Multidetector CT imaging of the abdomen and pelvis was performed using the standard protocol following bolus administration of intravenous contrast. CT examination of the left lower extremity  was also performed.  CONTRAST:  126mL ISOVUE-300 IOPAMIDOL (ISOVUE-300) INJECTION 61%  COMPARISON:  Prior CT scan from 2 days ago.  FINDINGS: Lower chest: The lung bases are clear of acute process. Minimal dependent bibasilar atelectasis. No pleural effusion or pulmonary lesions. The heart is normal in size. No pericardial effusion. The distal esophagus and aorta are unremarkable.  Hepatobiliary: No focal hepatic lesions or intrahepatic biliary dilatation. The gallbladder is mildly contracted. No common bile duct dilatation.  Pancreas: No mass, inflammation or ductal dilatation.  Spleen: Normal size.  No focal lesions.  Adrenals/Urinary Tract: The adrenal glands and kidneys are unremarkable. No renal, ureteral or bladder calculi or mass.  Stomach/Bowel: The stomach, duodenum, small bowel and colon are unremarkable. No acute inflammatory changes, mass lesions or obstructive findings. The terminal ileum and appendix are normal.  Vascular/Lymphatic: The aorta and branch vessels are normal. The IVC is patent.  There is retroperitoneal lymphadenopathy mainly in the left para-aortic region. 11.5 mm lymph node noted on image number 54.  17.5 mm left para-aortic lymph node on image number 66. 16 mm left common iliac lymph node on image number 74. Again demonstrated is a large necrotic appearing nodal mass in the medial left external iliac region. This measures a maximum of 3.7 cm and adjacent to this  the left external iliac vein is thrombosed. Thrombus extends down into the common femoral vein to just above the profundus femoral vein. The right-sided venous structures are patent. No deep venous thrombosis is identified in the left thigh. The femoral and popliteal veins are patent.  Reproductive: The prostate gland and seminal vesicles are unremarkable.  Other: No intrapelvic or intra-abdominal fluid collections.  Musculoskeletal: No significant bony  findings.  LEFT LOWER EXTREMITY:  Extensive subcutaneous soft tissue swelling/edema/fluid involving the entire imaged left thigh with associated skin thickening and areas of skin blistering. This could be due to the deep venous thrombosis and or cellulitis. Is also diffuse edema in the muscular compartments of the thigh consistent with myofasciitis. No discrete abscess or findings to suggest pyomyositis. No knee or hip joint effusions to suggest septic arthritis.  Enlarged left axillary lymph nodes likely inflammatory.  IMPRESSION: 1. Left-sided retroperitoneal lymphadenopathy and left pelvic lymphadenopathy likely metastatic testicular cancer. The large necrotic appearing left medial external iliac lymph node is likely compressing the left external iliac vein and causing deep venous thrombosis extending down into the left groin. This does not extend down into the left thigh. 2. Marked and diffuse subcutaneous soft tissue swelling/edema/fluid along with skin thickening and skin blisters involving the left thigh. This could be due to the deep venous thrombosis or possibly could be secondary cellulitis and myofasciitis. No discrete drainable abscess is identified. 3. No definite CT findings for septic arthritis or osteomyelitis and no findings for metastatic disease involving the lung bases or bony structures.   Electronically Signed   By: Marijo Sanes M.D.   On: 10/10/2017 15:28   Assessment/Plan:  27 y.o. male patient admitted due to leg cellulitis. On physical exam it looks like it is not responding to the antibiotic therapy because the leg is getting more swollen. I consider that the problem may be from complication of the DVT that may be causing some sort of phlegmasia. I recommend a more expert evaluation of this possibility by a Vascular Surgeon. Yesterday, vascular surgery recommended lysis of the thrombus but the patient wanted to discuss it with the Oncologist. I  consider that if vascular surgery recommends lysis of thrombus, this will significant help the patient to solve the main problem, and if that decreases the edema, it will let the leg heal, and if any other intervention like debridement is needed, it will be able to heal, but with the worsening edema, it may not heal.   Agree with heparin drip and antibiotic therapy.   Arnold Long, MD

## 2017-10-11 NOTE — Progress Notes (Signed)
Request sent to [facility] imaging to powershare recent scans. Oncology Nurse Navigator Documentation  Navigator Location: [facility] (10/11/17 1500)   )Navigator Encounter Type: Diagnostic Results;Letter/Fax/Email (10/11/17 1500)                                                    Time Spent with Patient: 15 (10/11/17 1500)

## 2017-10-11 NOTE — Progress Notes (Addendum)
[facility] Hematology/Oncology Progress Note  Date of admission: 10/08/2017  Hospital day:  10/11/2017  Chief Complaint: MUAAD BOEHNING is a 27 y.o. male with recurrent testicular cancer and LLE DVT who was admitted through the medical oncology clinic with cellulitiis.  Subjective: patient feels the same as he did on the day of admission.  Pain is controlled. Fever has resolved.  Leg remains edematous with enlarging bullae.  Social History: The patient is accompanied by his wife, daughter, and mother today.  Allergies: No Known Allergies  Scheduled Medications: . DULoxetine  60 mg Oral Daily  . nicotine  21 mg Transdermal Daily    Review of Systems: GENERAL:  Feels "the same".  No fever or chills.  No weight loss. PERFORMANCE STATUS (ECOG):  2. HEENT:  No visual changes, runny nose, sore throat, mouth sores or tenderness. Lungs: No shortness of breath at rest. No cough.  No hemoptysis. Cardiac:  No chest pain, palpitations, orthopnea, or PND. GI:  No nausea, vomiting, diarrhea, constipation, melena or hematochezia. GU:  No urgency, frequency, dysuria, or hematuria. Musculoskeletal:  No back pain.  No joint pain.  No muscle tenderness. Extremities:  Left leg pain and swelling. Skin:  Lesions on left thigh (6) have increased in size. Neuro:  No headache, numbness or weakness, balance or coordination issues. Endocrine:  No diabetes, thyroid issues, hot flashes or night sweats. Psych:  No mood changes, depression or anxiety.  Poor sleep. Pain:  Left leg pain, controlled. Review of systems:  All other systems reviewed and found to be negative.  Physical Exam: Blood pressure 130/71, pulse 97, temperature 98 F (36.7 C), temperature source Oral, resp. rate 13, height 6' 3"  (1.905 m), weight 268 lb 1.6 oz (121.6 kg), SpO2 98 %.  GENERAL:  Chronically fatigued appearing gentleman lying flat with left leg extended. MENTAL STATUS:  Alert and oriented to person,  place and time. HEAD:  Dark thinninghair.  Normocephalic, atraumatic, face symmetric, no Cushingoid features. EYES:  Brown eyes.  Pupils equal round.  No conjunctivitis or scleral icterus. RESPIRATORY:  Clear to auscultation without rales, wheezes or rhonchi. CARDIOVASCULAR:  Regular rate and rhythm without murmur, rub or gallop. ABDOMEN:  Soft, non-tender, with active bowel sounds, and no hepatosplenomegaly.  No masses. SKIN:  Left leg with 6 hemorrhagic bullous lesions, dark/necrotic, largest 4 -5 cm.  Tender to touch.  Skin pink.  Area of erythema does no extend below knee. EXTREMITIES: Left leg edema to knee.  No palpable cords. NEUROLOGICAL: Unremarkable. PSYCH:  Appropriate.   Results for orders placed or performed during the hospital encounter of 10/08/17 (from the past 48 hour(s))  CBC     Status: Abnormal   Collection Time: 10/10/17 12:15 AM  Result Value Ref Range   WBC 12.5 (H) 3.8 - 10.6 K/uL   RBC 2.85 (L) 4.40 - 5.90 MIL/uL   Hemoglobin 8.6 (L) 13.0 - 18.0 g/dL   HCT 25.3 (L) 40.0 - 52.0 %   MCV 88.8 80.0 - 100.0 fL   MCH 30.2 26.0 - 34.0 pg   MCHC 34.0 32.0 - 36.0 g/dL   RDW 12.4 11.5 - 14.5 %   Platelets 212 150 - 440 K/uL    Comment: Performed at [facility], [address] [place], [place] [address]  Iron and TIBC     Status: Abnormal   Collection Time: 10/10/17 12:15 AM  Result Value Ref Range   Iron 20 (L) 45 - 182 ug/dL   TIBC  190 (L) 250 - 450 ug/dL   Saturation Ratios 11 (L) 17.9 - 39.5 %   UIBC 170 ug/dL    Comment: Performed at [facility], [address] [place], [place] [address]  Ferritin     Status: Abnormal   Collection Time: 10/10/17 12:15 AM  Result Value Ref Range   Ferritin 421 (H) 24 - 336 ng/mL    Comment: Performed at [facility], [address] [place], [place] [address]  Vitamin B12     Status: None   Collection Time: 10/10/17 12:15 AM  Result Value Ref Range   Vitamin B-12 829 180 - 914 pg/mL     Comment: (NOTE) This assay is not validated for testing neonatal or myeloproliferative syndrome specimens for Vitamin B12 levels. Performed at [facility], [place] [address] [place], [place] [address]   Folate     Status: None   Collection Time: 10/10/17 12:15 AM  Result Value Ref Range   Folate 10.8 >5.9 ng/mL    Comment: Performed at [facility], [address] [place], [place] [address]  APTT     Status: Abnormal   Collection Time: 10/10/17 12:15 AM  Result Value Ref Range   aPTT 58 (H) 24 - 36 seconds    Comment:        IF BASELINE aPTT IS ELEVATED, SUGGEST PATIENT RISK ASSESSMENT BE USED TO DETERMINE APPROPRIATE ANTICOAGULANT THERAPY. Performed at [facility], [place], [place] [address]   Heparin level (unfractionated)     Status: Abnormal   Collection Time: 10/10/17 12:15 AM  Result Value Ref Range   Heparin Unfractionated 0.16 (L) 0.30 - 0.70 IU/mL    Comment: (NOTE) If heparin results are below expected values, and patient dosage has  been confirmed, suggest follow up testing of antithrombin III levels. Performed at [facility], [address] [place], [place] [address]   APTT     Status: Abnormal   Collection Time: 10/10/17  7:53 AM  Result Value Ref Range   aPTT 59 (H) 24 - 36 seconds    Comment:        IF BASELINE aPTT IS ELEVATED, SUGGEST PATIENT RISK ASSESSMENT BE USED TO DETERMINE APPROPRIATE ANTICOAGULANT THERAPY. Performed at [facility], [address] [place], [place] [address]   Vancomycin, trough     Status: Abnormal   Collection Time: 10/10/17  1:32 PM  Result Value Ref Range   Vancomycin Tr 8 (L) 15 - 20 ug/mL    Comment: Performed at [facility], [address] [place], [place] [address]  APTT     Status: Abnormal   Collection Time: 10/10/17  6:49 PM  Result Value Ref Range   aPTT 73 (H) 24 - 36 seconds    Comment:        IF BASELINE aPTT IS ELEVATED, SUGGEST  PATIENT RISK ASSESSMENT BE USED TO DETERMINE APPROPRIATE ANTICOAGULANT THERAPY. Performed at [facility], [address] [place], [place] [address]   CBC     Status: Abnormal   Collection Time: 10/11/17  1:04 AM  Result Value Ref Range   WBC 14.0 (H) 3.8 - 10.6 K/uL   RBC 2.73 (L) 4.40 - 5.90 MIL/uL   Hemoglobin 8.7 (L) 13.0 - 18.0 g/dL   HCT 24.0 (L) 40.0 - 52.0 %   MCV 88.2 80.0 - 100.0 fL   MCH 31.9 26.0 - 34.0 pg   MCHC 36.2 (H) 32.0 -  36.0 g/dL   RDW 12.6 11.5 - 14.5 %   Platelets 289 150 - 440 K/uL    Comment: Performed at [facility], [address] [place], [place] [address]  Comprehensive metabolic panel     Status: Abnormal   Collection Time: 10/11/17  1:04 AM  Result Value Ref Range   Sodium 134 (L) 135 - 145 mmol/L   Potassium 3.4 (L) 3.5 - 5.1 mmol/L   Chloride 98 98 - 111 mmol/L   CO2 27 22 - 32 mmol/L   Glucose, Bld 103 (H) 70 - 99 mg/dL   BUN 10 6 - 20 mg/dL   Creatinine, Ser 0.81 0.61 - 1.24 mg/dL   Calcium 8.2 (L) 8.9 - 10.3 mg/dL   Total Protein 5.6 (L) 6.5 - 8.1 g/dL   Albumin 2.6 (L) 3.5 - 5.0 g/dL   AST 16 15 - 41 U/L   ALT 21 0 - 44 U/L   Alkaline Phosphatase 49 38 - 126 U/L   Total Bilirubin 0.6 0.3 - 1.2 mg/dL   GFR calc non Af Amer >60 >60 mL/min   GFR calc Af Amer >60 >60 mL/min    Comment: (NOTE) The eGFR has been calculated using the CKD EPI equation. This calculation has not been validated in all clinical situations. eGFR's persistently <60 mL/min signify possible Chronic Kidney Disease.    Anion gap 9 5 - 15    Comment: Performed at [facility], [place], [place] [address]  Heparin level (unfractionated)     Status: Abnormal   Collection Time: 10/11/17  1:04 AM  Result Value Ref Range   Heparin Unfractionated 0.27 (L) 0.30 - 0.70 IU/mL    Comment: (NOTE) If heparin results are below expected values, and patient dosage has  been confirmed, suggest follow up testing of antithrombin III  levels. Performed at [facility], [address] [place], [place] [address]   APTT     Status: Abnormal   Collection Time: 10/11/17  1:04 AM  Result Value Ref Range   aPTT 96 (H) 24 - 36 seconds    Comment:        IF BASELINE aPTT IS ELEVATED, SUGGEST PATIENT RISK ASSESSMENT BE USED TO DETERMINE APPROPRIATE ANTICOAGULANT THERAPY. Performed at [facility], [place], [place] [address]   Heparin level (unfractionated)     Status: Abnormal   Collection Time: 10/11/17  8:57 AM  Result Value Ref Range   Heparin Unfractionated 0.26 (L) 0.30 - 0.70 IU/mL    Comment: (NOTE) If heparin results are below expected values, and patient dosage has  been confirmed, suggest follow up testing of antithrombin III levels. Performed at [facility], [address] [place], [place] [address]   Vancomycin, trough     Status: Abnormal   Collection Time: 10/11/17  2:50 PM  Result Value Ref Range   Vancomycin Tr 14 (L) 15 - 20 ug/mL    Comment: Performed at [facility], [place], [place] [address]  Heparin level (unfractionated)     Status: None   Collection Time: 10/11/17  5:00 PM  Result Value Ref Range   Heparin Unfractionated 0.50 0.30 - 0.70 IU/mL    Comment: (NOTE) If heparin results are below expected values, and patient dosage has  been confirmed, suggest follow up testing of antithrombin III levels. Performed at [facility], [address], [place], [place] [address]    Ct Extremity Lower  Left W Contrast  Result Date: 10/10/2017 CLINICAL DATA:  History of testicular cancer with LEFT LOWER EXTREMITY PAIN AND SWELLING. EXAM: CT ABDOMEN/PELVIS WITH CONTRAST. CT OF THE LOWER LEFT EXTREMITY WITH CONTRAST TECHNIQUE: Multidetector CT imaging of the abdomen and pelvis was performed using the standard protocol following bolus administration of intravenous contrast. CT examination of the left lower extremity was  also performed. CONTRAST:  111m ISOVUE-300 IOPAMIDOL (ISOVUE-300) INJECTION 61% COMPARISON:  Prior CT scan from 2 days ago. FINDINGS: Lower chest: The lung bases are clear of acute process. Minimal dependent bibasilar atelectasis. No pleural effusion or pulmonary lesions. The heart is normal in size. No pericardial effusion. The distal esophagus and aorta are unremarkable. Hepatobiliary: No focal hepatic lesions or intrahepatic biliary dilatation. The gallbladder is mildly contracted. No common bile duct dilatation. Pancreas: No mass, inflammation or ductal dilatation. Spleen: Normal size.  No focal lesions. Adrenals/Urinary Tract: The adrenal glands and kidneys are unremarkable. No renal, ureteral or bladder calculi or mass. Stomach/Bowel: The stomach, duodenum, small bowel and colon are unremarkable. No acute inflammatory changes, mass lesions or obstructive findings. The terminal ileum and appendix are normal. Vascular/Lymphatic: The aorta and branch vessels are normal. The IVC is patent. There is retroperitoneal lymphadenopathy mainly in the left para-aortic region. 11.5 mm lymph node noted on image number 54. 17.5 mm left para-aortic lymph node on image number 66. 16 mm left common iliac lymph node on image number 74. Again demonstrated is a large necrotic appearing nodal mass in the medial left external iliac region. This measures a maximum of 3.7 cm and adjacent to this the left external iliac vein is thrombosed. Thrombus extends down into the common femoral vein to just above the profundus femoral vein. The right-sided venous structures are patent. No deep venous thrombosis is identified in the left thigh. The femoral and popliteal veins are patent. Reproductive: The prostate gland and seminal vesicles are unremarkable. Other: No intrapelvic or intra-abdominal fluid collections. Musculoskeletal: No significant bony findings. LEFT LOWER EXTREMITY: Extensive subcutaneous soft tissue swelling/edema/fluid  involving the entire imaged left thigh with associated skin thickening and areas of skin blistering. This could be due to the deep venous thrombosis and or cellulitis. Is also diffuse edema in the muscular compartments of the thigh consistent with myofasciitis. No discrete abscess or findings to suggest pyomyositis. No knee or hip joint effusions to suggest septic arthritis. Enlarged left axillary lymph nodes likely inflammatory. IMPRESSION: 1. Left-sided retroperitoneal lymphadenopathy and left pelvic lymphadenopathy likely metastatic testicular cancer. The large necrotic appearing left medial external iliac lymph node is likely compressing the left external iliac vein and causing deep venous thrombosis extending down into the left groin. This does not extend down into the left thigh. 2. Marked and diffuse subcutaneous soft tissue swelling/edema/fluid along with skin thickening and skin blisters involving the left thigh. This could be due to the deep venous thrombosis or possibly could be secondary cellulitis and myofasciitis. No discrete drainable abscess is identified. 3. No definite CT findings for septic arthritis or osteomyelitis and no findings for metastatic disease involving the lung bases or bony structures. Electronically Signed   By: PMarijo SanesM.D.   On: 10/10/2017 15:28   Ct Venogram Abd/pel  Result Date: 10/10/2017 CLINICAL DATA:  History of testicular cancer with LEFT LOWER EXTREMITY PAIN AND SWELLING. EXAM: CT ABDOMEN/PELVIS WITH CONTRAST. CT OF THE LOWER LEFT EXTREMITY WITH CONTRAST TECHNIQUE: Multidetector CT imaging of the abdomen and pelvis was performed using the standard protocol following bolus administration  of intravenous contrast. CT examination of the left lower extremity was also performed. CONTRAST:  173m ISOVUE-300 IOPAMIDOL (ISOVUE-300) INJECTION 61% COMPARISON:  Prior CT scan from 2 days ago. FINDINGS: Lower chest: The lung bases are clear of acute process. Minimal dependent  bibasilar atelectasis. No pleural effusion or pulmonary lesions. The heart is normal in size. No pericardial effusion. The distal esophagus and aorta are unremarkable. Hepatobiliary: No focal hepatic lesions or intrahepatic biliary dilatation. The gallbladder is mildly contracted. No common bile duct dilatation. Pancreas: No mass, inflammation or ductal dilatation. Spleen: Normal size.  No focal lesions. Adrenals/Urinary Tract: The adrenal glands and kidneys are unremarkable. No renal, ureteral or bladder calculi or mass. Stomach/Bowel: The stomach, duodenum, small bowel and colon are unremarkable. No acute inflammatory changes, mass lesions or obstructive findings. The terminal ileum and appendix are normal. Vascular/Lymphatic: The aorta and branch vessels are normal. The IVC is patent. There is retroperitoneal lymphadenopathy mainly in the left para-aortic region. 11.5 mm lymph node noted on image number 54. 17.5 mm left para-aortic lymph node on image number 66. 16 mm left common iliac lymph node on image number 74. Again demonstrated is a large necrotic appearing nodal mass in the medial left external iliac region. This measures a maximum of 3.7 cm and adjacent to this the left external iliac vein is thrombosed. Thrombus extends down into the common femoral vein to just above the profundus femoral vein. The right-sided venous structures are patent. No deep venous thrombosis is identified in the left thigh. The femoral and popliteal veins are patent. Reproductive: The prostate gland and seminal vesicles are unremarkable. Other: No intrapelvic or intra-abdominal fluid collections. Musculoskeletal: No significant bony findings. LEFT LOWER EXTREMITY: Extensive subcutaneous soft tissue swelling/edema/fluid involving the entire imaged left thigh with associated skin thickening and areas of skin blistering. This could be due to the deep venous thrombosis and or cellulitis. Is also diffuse edema in the muscular  compartments of the thigh consistent with myofasciitis. No discrete abscess or findings to suggest pyomyositis. No knee or hip joint effusions to suggest septic arthritis. Enlarged left axillary lymph nodes likely inflammatory. IMPRESSION: 1. Left-sided retroperitoneal lymphadenopathy and left pelvic lymphadenopathy likely metastatic testicular cancer. The large necrotic appearing left medial external iliac lymph node is likely compressing the left external iliac vein and causing deep venous thrombosis extending down into the left groin. This does not extend down into the left thigh. 2. Marked and diffuse subcutaneous soft tissue swelling/edema/fluid along with skin thickening and skin blisters involving the left thigh. This could be due to the deep venous thrombosis or possibly could be secondary cellulitis and myofasciitis. No discrete drainable abscess is identified. 3. No definite CT findings for septic arthritis or osteomyelitis and no findings for metastatic disease involving the lung bases or bony structures. Electronically Signed   By: PMarijo SanesM.D.   On: 10/10/2017 15:28    Assessment:  TCHANCELOR HARDRICKis a 27y.o. male with recurrent testicular cancer currently day 21 s/p cycle #1 TIP chemotherapy who was admitted with left lower extremity edema, progressive skin lesions, and cellulitis.  He was diagnosed with a left lower extremity DVT.  Bilateral lower extremity duplex on 09/10/2017 revealed evidence of obstruction in the left CFV, SFJ, and proximal femoral vein.  He is on Xarelto.  He was diagnosed with cellulitis of the left lower extremity on 10/06/2017.  He was on clindamycin as an outpatient.  He had persistent fevers and worsening left lower extremity symptoms.  He is currently on vancomycin and cefepime.  Plan: 1.   Oncology:  Day 21 s/p cycle #1 TIP chemotherapy (last given at [facility] on 09/21/2017). Cycle#2 chemotherapy was planned for tomorrow, but currently postponed secondary to  clinical condition.  Outside CT scans from 09/10/2017- 09/13/2017 requested from [facility] to compare with recent imaging in order to assess abdominal adenopathy and thrombosis.  Unclear until imaging directly compared if there is change in disease and/or original LLE thrombosis.  Discussed comparing venogram from 09/10/2017 and abdomen/pelvis CT on 09/11/2017 to assess for changes resulting in current clinical condition.  2.  Hematology:  Normocytic anemia is stable.  Anemia work-up with normal B12, folate, ferritin (? elevated secondary to acute phase reactant).  Iron saturation low.  Patient off Xarelto and on IV heparin.  Heparin level being followed.  Vascular surgery feels skin changes secondary to obstruction.  Lesions somewhat reminiscent of Coumadin skin necrosis.  Patient has not been on Coumadin, but Xarelto prior to admission. Xarelto not known to cause skin necrosis, but has been reported to cause blisters (1%).  Will check protein C and S levels (as well as additional hypercoagulable work-up).  Thrombotic skin lesions have also been noted in the antiphospholipid syndrome, DIC, and heparin-induced thrombocytopenia (HIT).  Platelet count stable. .   3.  Vascular:  Concern for extrinsic compression of vasculature causing decreased flow with component of thrombosis.  Plan to compare current images with imaging from end of 08/2017 and early 09/2017 from [facility].  Case discussed with Dr. Delana Meyer.  Consideration is being made for stent placement.  4.  Infectious disease:  Cellulitis is clinically improving with resolution of fever and some improvement in skin erythema.  No positive cultures.  Continue broad spectrum antibiotics.  5.  Pain and toxicology:  Pain currently being managed with morphine and oxycodone.    A total of (35) minutes of face-to-face time was spent with the patient with greater than 50% of that time in counseling and care-coordination.    [name], MD  10/11/2017, 9:53 PM

## 2017-10-11 NOTE — Progress Notes (Signed)
ANTICOAGULATION CONSULT NOTE  Pharmacy Consult for Heparin drip Indication: DVT  No Known Allergies  Patient Measurements: Height: 6\' 3"  (190.5 cm) Weight: 268 lb 1.6 oz (121.6 kg) IBW/kg (Calculated) : 84.5 Heparin Dosing Weight: 110 kg  Vital Signs: Temp: 98.9 F (37.2 C) (07/29 0522) Temp Source: Oral (07/29 0522) BP: 119/73 (07/29 0522) Pulse Rate: 78 (07/29 0522)  Labs: Recent Labs    10/08/17 1611 10/09/17 0728  10/10/17 0015 10/10/17 0753 10/10/17 1849 10/11/17 0104 10/11/17 0857  HGB 8.9* 8.9*  --  8.6*  --   --  8.7*  --   HCT 26.3* 26.3*  --  25.3*  --   --  24.0*  --   PLT 137* 170  --  212  --   --  289  --   APTT 44* 48*   < > 58* 59* 73* 96*  --   LABPROT 20.9*  --   --   --   --   --   --   --   INR 1.82  --   --   --   --   --   --   --   HEPARINUNFRC 3.16* 0.40  --  0.16*  --   --  0.27* 0.26*  CREATININE 0.95 0.92  --   --   --   --  0.81  --    < > = values in this interval not displayed.    Estimated Creatinine Clearance: 189 mL/min (by C-G formula based on SCr of 0.81 mg/dL).   Medical History: Past Medical History:  Diagnosis Date  . Asthma    AS A CHILD-NO INHALERS  . Cancer ([place])    testicular cancer 11/2016 per pt   . GERD (gastroesophageal reflux disease)    TUMS PRN    Medications:  Patient was taking Xarelto prior to admission for DVT. Last dose was on 10/07/17 but note states that patient was unable to keep down.  Assessment: Patient is a 27yo male admitted for LE cellulitis. Pharmacy consulted for transition to Heparin infusion. Baseline Heparin level is elevated at 3.16 so will need to use aPTT to guide therapy    Goal of Therapy:  Heparin level 0.3-0.7 units/ml  Monitor platelets by anticoagulation protocol: Yes   Plan:  Will bolus heparin 1650 units IV once and increase infusion to 2750 units/hr. Wil recheck a HL in 6 hours.   Napoleon Form, PharmD, BCPS Clinical Pharmacist 10/11/2017 12:24 PM

## 2017-10-11 NOTE — Progress Notes (Signed)
Pharmacy Antibiotic Note  Edgar [name] is a 27 y.o. male admitted on 10/08/2017 with cellulitis.  Pharmacy has been consulted for Vancomycin and Cefepime dosing.  Assessment/Plan: Will continue cefepime 2 g IV q12 hours.   Will continue vancomycin 1500 mg iv q 8 hours with trough at goal. Will f/u plans for antibiotics and consider checking another level in the next few days if plan to continue vancomycin.   Height: 6\' 3"  (190.5 cm) Weight: 268 lb 1.6 oz (121.6 kg) IBW/kg (Calculated) : 84.5  Temp (24hrs), Avg:98.7 F (37.1 C), Min:98.5 F (36.9 C), Max:98.9 F (37.2 C)  Recent Labs  Lab 10/06/17 2222 10/08/17 1611 10/08/17 1731 10/08/17 1949 10/09/17 0728 10/10/17 0015 10/10/17 1332 10/11/17 0104 10/11/17 1450  WBC 9.2 12.1*  --   --  10.9* 12.5*  --  14.0*  --   CREATININE 0.73 0.95  --   --  0.92  --   --  0.81  --   LATICACIDVEN 0.9  --  1.3 0.9  --   --   --   --   --   VANCOTROUGH  --   --   --   --   --   --  8*  --  14*    Estimated Creatinine Clearance: 189 mL/min (by C-G formula based on SCr of 0.81 mg/dL).    No Known Allergies  Antimicrobials this admission: Vancomycin 7/26 >>  Cefepime 7/26 >>  Clindamycin 7/26 >> 7/29   Ulice Dash, PharmD Clinical Pharmacist  10/11/2017 3:26 PM

## 2017-10-11 NOTE — Progress Notes (Signed)
[facility]  Daily Progress Note   Subjective  - * No surgery found *  The patient is complaining of severe pain in the left lower extremity.  He can barely move his leg without severe pain.  Objective Vitals:   10/10/17 0411 10/10/17 1952 10/11/17 0522 10/11/17 1419  BP: 123/77 111/71 119/73 118/78  Pulse: 85 92 78 89  Resp: 15 14 15 20   Temp: 97.7 F (36.5 C) 98.7 F (37.1 C) 98.9 F (37.2 C) 98.5 F (36.9 C)  TempSrc: Oral Oral Oral Oral  SpO2: 96% 97% 95% 95%  Weight:      Height:        Intake/Output Summary (Last 24 hours) at 10/11/2017 1940 Last data filed at 10/11/2017 1828 Gross per 24 hour  Intake 1303.64 ml  Output 2350 ml  Net -1046.36 ml    PULM  Normal effort , no use of accessory muscles CV  No JVD, RRR Abd      No distended, nontender VASC  left leg is massively edematous.  Toes are pink.  There are multiple bullae with necrotic skin.  There is marked erythema surrounding the bullae it is tender to the touch.  Laboratory CBC    Component Value Date/Time   WBC 14.0 (H) 10/11/2017 0104   HGB 8.7 (L) 10/11/2017 0104   HCT 24.0 (L) 10/11/2017 0104   PLT 289 10/11/2017 0104    BMET    Component Value Date/Time   NA 134 (L) 10/11/2017 0104   K 3.4 (L) 10/11/2017 0104   CL 98 10/11/2017 0104   CO2 27 10/11/2017 0104   GLUCOSE 103 (H) 10/11/2017 0104   BUN 10 10/11/2017 0104   CREATININE 0.81 10/11/2017 0104   CALCIUM 8.2 (L) 10/11/2017 0104   GFRNONAA >60 10/11/2017 0104   GFRAA >60 10/11/2017 0104    Assessment/Planning: 1.  Venous obstruction secondary to tumor mass: I have examined the CT scan personally.  I have also reviewed the ultrasound.  The patient does not have thrombus within the common femoral they can be visualized with ultrasound and there is a high likelihood that this finding on CT represents admixture.  Given this considering the severity of his left lower extremity the extreme pain that he is in the  bullae which are clearly related to the venous outflow obstruction decompressing the venous system would clearly be a positive and beneficial thing.  I have discussed the possibility of stenting the venous system and any venous intervention would require stenting angioplasty alone would not be sufficient.  I have described the future problems such as scarring and restenosis reocclusion with the patient and his family.  Given the likelihood he would have to be prone in the pain that he is in with his leg I have also discussed that he would require general anesthesia.  Furthermore, I have discussed placement of an IVC filter at the beginning of the procedure to prevent pulmonary emboli.  Total length of discussion was 40 minutes.  At this time the patient and his family wish to consider this.     I will continue to follow but will not put him on the schedule at this point in time    Hortencia Pilar  10/11/2017, 7:40 PM

## 2017-10-11 NOTE — Plan of Care (Signed)
  Problem: Education: Goal: Knowledge of General Education information will improve Description Including pain rating scale, medication(s)/side effects and non-pharmacologic comfort measures Outcome: Progressing   Problem: Health Behavior/Discharge Planning: Goal: Ability to manage health-related needs will improve Outcome: Progressing   Problem: Pain Managment: Goal: General experience of comfort will improve Outcome: Not Progressing Note:  Pt continues to have pain to left upper leg. PRN medication given with some relief   Problem: Skin Integrity: Goal: Risk for impaired skin integrity will decrease Outcome: Not Progressing Note:  Blisters on pt's left upper extremity wrapped in vaseline gauze. Changed this shift.

## 2017-10-12 ENCOUNTER — Inpatient Hospital Stay: Payer: Self-pay

## 2017-10-12 DIAGNOSIS — M79605 Pain in left leg: Secondary | ICD-10-CM

## 2017-10-12 DIAGNOSIS — I82409 Acute embolism and thrombosis of unspecified deep veins of unspecified lower extremity: Secondary | ICD-10-CM

## 2017-10-12 DIAGNOSIS — I871 Compression of vein: Secondary | ICD-10-CM

## 2017-10-12 LAB — CBC
HEMATOCRIT: 26.2 % — AB (ref 40.0–52.0)
HEMOGLOBIN: 9.1 g/dL — AB (ref 13.0–18.0)
MCH: 30.8 pg (ref 26.0–34.0)
MCHC: 34.7 g/dL (ref 32.0–36.0)
MCV: 88.8 fL (ref 80.0–100.0)
Platelets: 448 10*3/uL — ABNORMAL HIGH (ref 150–440)
RBC: 2.95 MIL/uL — ABNORMAL LOW (ref 4.40–5.90)
RDW: 12.5 % (ref 11.5–14.5)
WBC: 10.7 10*3/uL — ABNORMAL HIGH (ref 3.8–10.6)

## 2017-10-12 LAB — ANTITHROMBIN III: AntiThromb III Func: 54 % — ABNORMAL LOW (ref 75–120)

## 2017-10-12 LAB — FIBRINOGEN: Fibrinogen: 731 mg/dL — ABNORMAL HIGH (ref 210–475)

## 2017-10-12 LAB — HEPARIN LEVEL (UNFRACTIONATED): Heparin Unfractionated: 0.55 IU/mL (ref 0.30–0.70)

## 2017-10-12 MED ORDER — MAGNESIUM OXIDE 400 (241.3 MG) MG PO TABS
400.0000 mg | ORAL_TABLET | Freq: Every day | ORAL | Status: DC
  Administered 2017-10-12 – 2017-10-16 (×4): 400 mg via ORAL
  Filled 2017-10-12 (×4): qty 1

## 2017-10-12 MED ORDER — SODIUM CHLORIDE 0.9 % IV SOLN
INTRAVENOUS | Status: DC
  Administered 2017-10-13 – 2017-10-15 (×4): via INTRAVENOUS

## 2017-10-12 MED ORDER — DOCUSATE SODIUM 100 MG PO CAPS
100.0000 mg | ORAL_CAPSULE | Freq: Two times a day (BID) | ORAL | Status: DC
  Administered 2017-10-12 – 2017-10-14 (×3): 100 mg via ORAL
  Filled 2017-10-12 (×5): qty 1

## 2017-10-12 MED ORDER — OXYCODONE HCL ER 10 MG PO T12A
10.0000 mg | EXTENDED_RELEASE_TABLET | Freq: Two times a day (BID) | ORAL | Status: DC
  Administered 2017-10-12 – 2017-10-16 (×8): 10 mg via ORAL
  Filled 2017-10-12 (×8): qty 1

## 2017-10-12 MED ORDER — POTASSIUM CHLORIDE CRYS ER 20 MEQ PO TBCR
20.0000 meq | EXTENDED_RELEASE_TABLET | Freq: Two times a day (BID) | ORAL | Status: DC
  Administered 2017-10-12 – 2017-10-16 (×8): 20 meq via ORAL
  Filled 2017-10-12 (×8): qty 1

## 2017-10-12 NOTE — Progress Notes (Signed)
[facility] [place] NAME: Edgar [name]    MR#:  [id number]  DATE OF BIRTH:  October 08, 1990  SUBJECTIVE:  CHIEF COMPLAINT:  No chief complaint on file.  Admitted for left thigh cellulitis.  Found to have left iliac vein thrombus. On heparin drip.  Unable to move left leg much Family at bedside  REVIEW OF SYSTEMS:  CONSTITUTIONAL: No fever, fatigue or weakness.  EYES: No blurred or double vision.  EARS, NOSE, AND THROAT: No tinnitus or ear pain.  RESPIRATORY: No cough, shortness of breath, wheezing or hemoptysis.  CARDIOVASCULAR: No chest pain, orthopnea, edema.  GASTROINTESTINAL: No nausea, vomiting, diarrhea or abdominal pain.  GENITOURINARY: No dysuria, hematuria.  ENDOCRINE: No polyuria, nocturia,  HEMATOLOGY: No anemia, easy bruising or bleeding SKIN: No rash or lesion. MUSCULOSKELETAL: No joint pain or arthritis.  Left thigh and leg swelling. NEUROLOGIC: No tingling, numbness, weakness.  PSYCHIATRY: No anxiety or depression.   ROS  DRUG ALLERGIES:  No Known Allergies  VITALS:  Blood pressure 121/71, pulse 88, temperature 98.6 F (37 C), temperature source Oral, resp. rate 18, height 6\' 3"  (1.905 m), weight 121.6 kg (268 lb 1.6 oz), SpO2 96 %.  PHYSICAL EXAMINATION:   GENERAL:  27 y.o.-year-old patient lying in the bed with no acute distress.  EYES: Pupils equal, round, reactive to light and accommodation. No scleral icterus. Extraocular muscles intact.  HEENT: Head atraumatic, normocephalic. Oropharynx and nasopharynx clear.  NECK:  Supple, no jugular venous distention. No thyroid enlargement, no tenderness.  LUNGS: Normal breath sounds bilaterally, no wheezing, rales,rhonchi or crepitation. No use of accessory muscles of respiration.  CARDIOVASCULAR: S1, S2 normal. No murmurs, rubs, or gallops.  ABDOMEN: Soft, nontender, nondistended. Bowel sounds present. No organomegaly or mass.  EXTREMITIES: Left leg and thigh are  swollen.  Left thigh dressing and surrounding erythema NEUROLOGIC: Cranial nerves II through XII are intact. Muscle strength 5/5 in all extremities. Sensation intact. Gait not checked.  PSYCHIATRIC: The patient is alert and oriented x 3.  SKIN: No obvious rash, lesion, or ulcer.   Physical Exam LABORATORY PANEL:   CBC Recent Labs  Lab 10/12/17 0408  WBC 10.7*  HGB 9.1*  HCT 26.2*  PLT 448*   ------------------------------------------------------------------------------------------------------------------  Chemistries  Recent Labs  Lab 10/11/17 0104  NA 134*  K 3.4*  CL 98  CO2 27  GLUCOSE 103*  BUN 10  CREATININE 0.81  CALCIUM 8.2*  AST 16  ALT 21  ALKPHOS 49  BILITOT 0.6   ------------------------------------------------------------------------------------------------------------------  Cardiac Enzymes No results for input(s): TROPONINI in the last 168 hours. ------------------------------------------------------------------------------------------------------------------  RADIOLOGY:  Ct Outside Films Chest  Result Date: 10/12/2017 This examination belongs to an outside facility and is stored here for comparison purposes only.  Contact the originating outside institution for any associated report or interpretation.  Mr Outside Films Head/face  Result Date: 10/12/2017 This examination belongs to an outside facility and is stored here for comparison purposes only.  Contact the originating outside institution for any associated report or interpretation.  Ct Outside Films Body  Result Date: 10/12/2017 This examination belongs to an outside facility and is stored here for comparison purposes only.  Contact the originating outside institution for any associated report or interpretation.  Ct Outside Films Body  Result Date: 10/12/2017 This examination belongs to an outside facility and is stored here for comparison purposes only.  Contact the originating outside  institution for any associated report or interpretation.   ASSESSMENT AND  PLAN:   Principal Problem:   Cellulitis Active Problems:   Normocytic anemia   DVT (deep venous thrombosis) (HCC)  *Cellulitis on leg and thigh with sepsis Still has significant redness and swelling.  On vancomycin and cefepime.  Stopped clindamycin. Appreciated surgical help, no necrotizing fasciitis, no acute surgical intervention needed. As needed pain medications. Added oxycontin for better pain control Xarelto held and on heparin drip in anticipation of needing interventions. CT scan of the leg showed no abscess or air. Plan is to have Venous stent in AM per Dr. Delana Meyer once he discuses with patient and family  *DVT on left leg Continue heparin drip for now. CT venogram showed left external iliac vein thrombus  * Testicular cancer reoccurrence Outside images from [facility] uploaded here. Waiting for review by oncology and radiology.  * Smoking Counseled earlier in the admission  All the records are reviewed and case discussed with Care Management/Social Workerr. Management plans discussed with the patient, family and they are in agreement.  CODE STATUS: Full.  TOTAL TIME TAKING CARE OF THIS PATIENT:35 minutes.   POSSIBLE D/C IN 1-2 DAYS, DEPENDING ON CLINICAL CONDITION.  Neita Carp M.D on 10/12/2017   Between 7am to 6pm - Pager - [phone]  After 6pm go to [email or web address] - password [email or web address] [facility]  Office  [phone]  CC: Primary care physician; Patient, No Pcp Per  Note: This dictation was prepared with Dragon dictation along with smaller phrase technology. Any transcriptional errors that result from this process are unintentional.

## 2017-10-12 NOTE — Progress Notes (Signed)
MRN : [id number]  Edgar [name] is a 27 y.o. ([birth year]) male who presents with chief complaint of massive pain and swelling of my left leg.  History of Present Illness: The patient continues to have severe pain of the left lower extremity and is receiving narcotics to try to control this pain.  His blisters have started to rupture.  His leg remains very swollen and tight.  He essentially states he is unable to hardly move his leg.  Current Meds  Medication Sig  . acetaminophen (TYLENOL) 500 MG tablet Take 500-1,000 mg by mouth every 6 (six) hours as needed for mild pain or fever.   . clindamycin (CLEOCIN) 300 MG capsule Take 1 capsule (300 mg total) by mouth 3 (three) times daily for 10 days.  Marland Kitchen dexamethasone (DECADRON) 4 MG tablet Take 2 tablets (8mg  total) by mouth daily for 3 days after chemotherapy to prevent nausea  . DULoxetine (CYMBALTA) 60 MG capsule Take 1 tablet by mouth daily.  . [EXPIRED] HYDROcodone-acetaminophen (NORCO/VICODIN) 5-325 MG tablet Take 1 tablet by mouth every 6 (six) hours as needed for up to 5 days for moderate pain.  Marland Kitchen levofloxacin (LEVAQUIN) 500 MG tablet Take 1 tablet by mouth daily on days 7-13 of every chemotherapy cycle to prevent infection  . magnesium oxide (MAG-OX) 400 MG tablet Take 1 tablet by mouth daily.  . nicotine (NICODERM CQ - DOSED IN MG/24 HR) 7 mg/24hr patch Place onto the skin daily.   . ondansetron (ZOFRAN-ODT) 4 MG disintegrating tablet Take 2 tablets by mouth every 8 (eight) hours as needed.  . Oxycodone HCl 10 MG TABS Take 10 mg by mouth 4 (four) times daily as needed (pain).   . polyethylene glycol (MIRALAX / GLYCOLAX) packet Take 17 g by mouth daily as needed for mild constipation.   . potassium chloride (KLOR-CON) 20 MEQ packet Take 20 mEq by mouth 2 (two) times daily.   . prochlorperazine (COMPAZINE) 10 MG tablet Take 1 tablet by mouth every 6 (six) hours as needed (nausea/vomiting).   Alveda Reasons 20 MG TABS tablet Take 1 tablet by  mouth daily.    Past Medical History:  Diagnosis Date  . Asthma    AS A CHILD-NO INHALERS  . Cancer ([place])    testicular cancer 11/2016 per pt   . GERD (gastroesophageal reflux disease)    TUMS PRN    Past Surgical History:  Procedure Laterality Date  . ANKLE FRACTURE SURGERY Right 2004  . ORCHIECTOMY Left 11/26/2015   Procedure: ORCHIECTOMY;  Surgeon: [name], MD;  Location: [facility];  Service: Urology;  Laterality: Left;  radical/Inguinal approach  . PERIPHERAL VASCULAR CATHETERIZATION N/A 12/16/2015   Procedure: [name] Cath Insertion;  Surgeon: [name], MD;  Location: [facility];  Service: Cardiovascular;  Laterality: N/A;  . WISDOM TOOTH EXTRACTION  2017    Social History Social History   Tobacco Use  . Smoking status: Current Every Day Smoker    Packs/day: 0.50    Years: 8.00    Pack years: 4.00    Types: Cigarettes  . Smokeless tobacco: Former Systems developer    Types: Snuff  Substance Use Topics  . Alcohol use: No  . Drug use: Yes    Types: Marijuana    Family History Family History  Problem Relation Age of Onset  . Skin cancer Mother   . Breast cancer Maternal Grandmother   . Colon cancer Maternal Grandfather   . Diabetes Maternal Grandfather   .  Emphysema Father   . Mental illness Sister        anxiety  . Stroke Paternal Grandmother   . Prostate cancer Neg Hx   . Kidney cancer Neg Hx   . Bladder Cancer Neg Hx     No Known Allergies   REVIEW OF SYSTEMS (Negative unless checked)  Constitutional: [] Weight loss  [] Fever  [] Chills Cardiac: [] Chest pain   [] Chest pressure   [] Palpitations   [] Shortness of breath when laying flat   [] Shortness of breath with exertion. Vascular:  [x] Pain in legs with walking   [x] Pain in legs at rest  [x] History of DVT   [] Phlebitis   [x] Swelling in legs   [] Varicose veins   [] Non-healing ulcers Pulmonary:   [] Uses home oxygen   [] Productive cough   [] Hemoptysis   [] Wheeze  [] COPD   [] Asthma Neurologic:   [] Dizziness   [] Seizures   [] History of stroke   [] History of TIA  [] Aphasia   [] Vissual changes   [] Weakness or numbness in arm   [] Weakness or numbness in leg Musculoskeletal:   [] Joint swelling   [] Joint pain   [] Low back pain Hematologic:  [] Easy bruising  [] Easy bleeding   [] Hypercoagulable state   [] Anemic Gastrointestinal:  [] Diarrhea   [] Vomiting  [] Gastroesophageal reflux/heartburn   [] Difficulty swallowing. Genitourinary:  [] Chronic kidney disease   [] Difficult urination  [] Frequent urination   [] Blood in urine Skin:  [] Rashes   [] Ulcers  Psychological:  [] History of anxiety   []  History of major depression.  Physical Examination  Vitals:   10/11/17 1419 10/11/17 1943 10/12/17 0501 10/12/17 1300  BP: 118/78 130/71 111/68 120/70  Pulse: 89 97 79 88  Resp: 20 13 14 18   Temp: 98.5 F (36.9 C) 98 F (36.7 C) 98.2 F (36.8 C) 98 F (36.7 C)  TempSrc: Oral Oral Oral Oral  SpO2: 95% 98% 100% 98%  Weight:      Height:       Body mass index is 33.51 kg/m. Gen: WD/WN, NAD Head: [place]/AT, No temporalis wasting.  Ear/Nose/Throat: Hearing grossly intact, nares w/o erythema or drainage Eyes: PER, EOMI, sclera nonicteric.  Neck: Supple, no large masses.   Pulmonary:  Good air movement, no audible wheezing bilaterally, no use of accessory muscles.  Cardiac: RRR, no JVD Vascular: There is massive edema of his left leg with mild erythema.  There are multiple bullae some of which have ruptured.  Capillary refill is still approximately 1 second. Vessel Right Left  Radial Palpable Palpable  Gastrointestinal: Non-distended. No guarding/no peritoneal signs.  Musculoskeletal: M/S 5/5 throughout.  No deformity or atrophy.  Neurologic: CN 2-12 intact. Symmetrical.  Speech is fluent. Motor exam as listed above. Psychiatric: Judgment intact, Mood & affect appropriate for pt's clinical situation. Dermatologic: No rashes  Lymph : No lichenification or skin changes of chronic  lymphedema.  CBC Lab Results  Component Value Date   WBC 10.7 (H) 10/12/2017   HGB 9.1 (L) 10/12/2017   HCT 26.2 (L) 10/12/2017   MCV 88.8 10/12/2017   PLT 448 (H) 10/12/2017    BMET    Component Value Date/Time   NA 134 (L) 10/11/2017 0104   K 3.4 (L) 10/11/2017 0104   CL 98 10/11/2017 0104   CO2 27 10/11/2017 0104   GLUCOSE 103 (H) 10/11/2017 0104   BUN 10 10/11/2017 0104   CREATININE 0.81 10/11/2017 0104   CALCIUM 8.2 (L) 10/11/2017 0104   GFRNONAA >60 10/11/2017 0104   GFRAA >60 10/11/2017 0104  Estimated Creatinine Clearance: 189 mL/min (by C-G formula based on SCr of 0.81 mg/dL).  COAG Lab Results  Component Value Date   INR 1.82 10/08/2017   INR 2.26 10/06/2017    Radiology Ct Femur Left W Contrast  Result Date: 10/08/2017 CLINICAL DATA:  Left lower extremity cellulitis and possible necrotizing fasciitis. EXAM: CT OF THE LOWER RIGHT EXTREMITY WITH CONTRAST TECHNIQUE: Multidetector CT imaging of the lower right extremity was performed according to the standard protocol following intravenous contrast administration. COMPARISON:  None. CONTRAST:  184mL ISOVUE-300 IOPAMIDOL (ISOVUE-300) INJECTION 61% FINDINGS: Mild inflammatory stranding of the subcutaneous tissues of the left thigh is noted anteriorly and medially suggesting cellulitis. No definite abscess or fluid collection is noted. No definite bony abnormality or fracture is noted involving the visualized bone. 3.7 cm left external iliac lymph node is noted consistent with metastatic disease. Probable filling defect is seen in the left common femoral and external iliac vein concerning for thrombus. Potentially this may extend into the left superficial femoral vein. IMPRESSION: Mild inflammatory stranding of the subcutaneous tissues of the left thigh is noted anteriorly and medially suggesting cellulitis or possibly edema. No abscess or fluid collection is noted. Filling defect is noted in the visualized portion of the  left external iliac vein and common femoral vein suggesting deep venous thrombosis. Potentially this may extended to the left superficial femoral vein and further evaluation with Doppler ultrasound is recommended. These results will be called to the ordering clinician or representative by the Radiologist Assistant, and communication documented in the PACS or zVision Dashboard. 3.7 cm left external iliac lymph node is noted consistent with metastatic disease. Electronically Signed   By: Marijo Conception, M.D.   On: 10/08/2017 22:24   US Venous Img Lower Unilateral Left  Result Date: 10/09/2017 CLINICAL DATA:  Left lower extremity cellulitis with CT demonstrating possible DVT in the common femoral and external iliac veins. EXAM: LEFT LOWER EXTREMITY VENOUS DOPPLER ULTRASOUND TECHNIQUE: Gray-scale sonography with graded compression, as well as color Doppler and duplex ultrasound were performed to evaluate the lower extremity deep venous systems from the level of the common femoral vein and including the common femoral, femoral, profunda femoral, popliteal and calf veins including the posterior tibial, peroneal and gastrocnemius veins when visible. The superficial great saphenous vein was also interrogated. Spectral Doppler was utilized to evaluate flow at rest and with distal augmentation maneuvers in the common femoral, femoral and popliteal veins. COMPARISON:  None. FINDINGS: Contralateral Common Femoral Vein: Respiratory phasicity is normal and symmetric with the symptomatic side. No evidence of thrombus. Normal compressibility. Common Femoral Vein: No evidence of thrombus. Normal compressibility, respiratory phasicity and response to augmentation. Saphenofemoral Junction: No evidence of thrombus. Normal compressibility and flow on color Doppler imaging. Profunda Femoral Vein: No evidence of thrombus. Normal compressibility and flow on color Doppler imaging. Femoral Vein: No evidence of thrombus. Normal  compressibility, respiratory phasicity and response to augmentation. Popliteal Vein: No evidence of thrombus. Normal compressibility, respiratory phasicity and response to augmentation. Calf Veins: No evidence of thrombus. Normal compressibility and flow on color Doppler imaging. Superficial Great Saphenous Vein: No evidence of thrombus. Normal compressibility. Venous Reflux:  None. Other Findings: No evidence of superficial thrombophlebitis or abnormal fluid collection. IMPRESSION: No evidence of left lower extremity deep venous thrombosis. CT findings are felt to likely relate to streaming of contrast during early opacification of the venous system. If there is any progressive edema of the left lower extremity, follow-up duplex ultrasound may be  helpful. Electronically Signed   By: Aletta Edouard M.D.   On: 10/09/2017 15:27   Dg Chest Portable 1 View  Result Date: 10/06/2017 CLINICAL DATA:  Initial evaluation for acute fever. History of asthma. EXAM: PORTABLE CHEST 1 VIEW COMPARISON:  Prior CT from 09/30/2016 FINDINGS: Right-sided Port-A-Cath in place with tip overlying the right atrium. Transverse heart size at the upper limits of normal for AP technique. Mediastinal silhouette within normal limits. Lungs mildly hypoinflated. Mild scattered peribronchial thickening, greatest within the lung bases. No focal infiltrates. No pulmonary edema or pleural effusion. No pneumothorax. Remotely healed right-sided rib fracture noted. No acute osseous abnormality. IMPRESSION: Mild scattered peribronchial thickening, suspected to be related history of asthma. Acute bronchiolitis could be considered in the correct clinical setting. No focal infiltrates to suggest pneumonia. Electronically Signed   By: Jeannine Boga M.D.   On: 10/06/2017 22:30   Ct Extremity Lower Left W Contrast  Result Date: 10/10/2017 CLINICAL DATA:  History of testicular cancer with LEFT LOWER EXTREMITY PAIN AND SWELLING. EXAM: CT  ABDOMEN/PELVIS WITH CONTRAST. CT OF THE LOWER LEFT EXTREMITY WITH CONTRAST TECHNIQUE: Multidetector CT imaging of the abdomen and pelvis was performed using the standard protocol following bolus administration of intravenous contrast. CT examination of the left lower extremity was also performed. CONTRAST:  162mL ISOVUE-300 IOPAMIDOL (ISOVUE-300) INJECTION 61% COMPARISON:  Prior CT scan from 2 days ago. FINDINGS: Lower chest: The lung bases are clear of acute process. Minimal dependent bibasilar atelectasis. No pleural effusion or pulmonary lesions. The heart is normal in size. No pericardial effusion. The distal esophagus and aorta are unremarkable. Hepatobiliary: No focal hepatic lesions or intrahepatic biliary dilatation. The gallbladder is mildly contracted. No common bile duct dilatation. Pancreas: No mass, inflammation or ductal dilatation. Spleen: Normal size.  No focal lesions. Adrenals/Urinary Tract: The adrenal glands and kidneys are unremarkable. No renal, ureteral or bladder calculi or mass. Stomach/Bowel: The stomach, duodenum, small bowel and colon are unremarkable. No acute inflammatory changes, mass lesions or obstructive findings. The terminal ileum and appendix are normal. Vascular/Lymphatic: The aorta and branch vessels are normal. The IVC is patent. There is retroperitoneal lymphadenopathy mainly in the left para-aortic region. 11.5 mm lymph node noted on image number 54. 17.5 mm left para-aortic lymph node on image number 66. 16 mm left common iliac lymph node on image number 74. Again demonstrated is a large necrotic appearing nodal mass in the medial left external iliac region. This measures a maximum of 3.7 cm and adjacent to this the left external iliac vein is thrombosed. Thrombus extends down into the common femoral vein to just above the profundus femoral vein. The right-sided venous structures are patent. No deep venous thrombosis is identified in the left thigh. The femoral and  popliteal veins are patent. Reproductive: The prostate gland and seminal vesicles are unremarkable. Other: No intrapelvic or intra-abdominal fluid collections. Musculoskeletal: No significant bony findings. LEFT LOWER EXTREMITY: Extensive subcutaneous soft tissue swelling/edema/fluid involving the entire imaged left thigh with associated skin thickening and areas of skin blistering. This could be due to the deep venous thrombosis and or cellulitis. Is also diffuse edema in the muscular compartments of the thigh consistent with myofasciitis. No discrete abscess or findings to suggest pyomyositis. No knee or hip joint effusions to suggest septic arthritis. Enlarged left axillary lymph nodes likely inflammatory. IMPRESSION: 1. Left-sided retroperitoneal lymphadenopathy and left pelvic lymphadenopathy likely metastatic testicular cancer. The large necrotic appearing left medial external iliac lymph node is likely compressing the left  external iliac vein and causing deep venous thrombosis extending down into the left groin. This does not extend down into the left thigh. 2. Marked and diffuse subcutaneous soft tissue swelling/edema/fluid along with skin thickening and skin blisters involving the left thigh. This could be due to the deep venous thrombosis or possibly could be secondary cellulitis and myofasciitis. No discrete drainable abscess is identified. 3. No definite CT findings for septic arthritis or osteomyelitis and no findings for metastatic disease involving the lung bases or bony structures. Electronically Signed   By: Marijo Sanes M.D.   On: 10/10/2017 15:28   Ct Venogram Abd/pel  Result Date: 10/10/2017 CLINICAL DATA:  History of testicular cancer with LEFT LOWER EXTREMITY PAIN AND SWELLING. EXAM: CT ABDOMEN/PELVIS WITH CONTRAST. CT OF THE LOWER LEFT EXTREMITY WITH CONTRAST TECHNIQUE: Multidetector CT imaging of the abdomen and pelvis was performed using the standard protocol following bolus  administration of intravenous contrast. CT examination of the left lower extremity was also performed. CONTRAST:  144mL ISOVUE-300 IOPAMIDOL (ISOVUE-300) INJECTION 61% COMPARISON:  Prior CT scan from 2 days ago. FINDINGS: Lower chest: The lung bases are clear of acute process. Minimal dependent bibasilar atelectasis. No pleural effusion or pulmonary lesions. The heart is normal in size. No pericardial effusion. The distal esophagus and aorta are unremarkable. Hepatobiliary: No focal hepatic lesions or intrahepatic biliary dilatation. The gallbladder is mildly contracted. No common bile duct dilatation. Pancreas: No mass, inflammation or ductal dilatation. Spleen: Normal size.  No focal lesions. Adrenals/Urinary Tract: The adrenal glands and kidneys are unremarkable. No renal, ureteral or bladder calculi or mass. Stomach/Bowel: The stomach, duodenum, small bowel and colon are unremarkable. No acute inflammatory changes, mass lesions or obstructive findings. The terminal ileum and appendix are normal. Vascular/Lymphatic: The aorta and branch vessels are normal. The IVC is patent. There is retroperitoneal lymphadenopathy mainly in the left para-aortic region. 11.5 mm lymph node noted on image number 54. 17.5 mm left para-aortic lymph node on image number 66. 16 mm left common iliac lymph node on image number 74. Again demonstrated is a large necrotic appearing nodal mass in the medial left external iliac region. This measures a maximum of 3.7 cm and adjacent to this the left external iliac vein is thrombosed. Thrombus extends down into the common femoral vein to just above the profundus femoral vein. The right-sided venous structures are patent. No deep venous thrombosis is identified in the left thigh. The femoral and popliteal veins are patent. Reproductive: The prostate gland and seminal vesicles are unremarkable. Other: No intrapelvic or intra-abdominal fluid collections. Musculoskeletal: No significant bony  findings. LEFT LOWER EXTREMITY: Extensive subcutaneous soft tissue swelling/edema/fluid involving the entire imaged left thigh with associated skin thickening and areas of skin blistering. This could be due to the deep venous thrombosis and or cellulitis. Is also diffuse edema in the muscular compartments of the thigh consistent with myofasciitis. No discrete abscess or findings to suggest pyomyositis. No knee or hip joint effusions to suggest septic arthritis. Enlarged left axillary lymph nodes likely inflammatory. IMPRESSION: 1. Left-sided retroperitoneal lymphadenopathy and left pelvic lymphadenopathy likely metastatic testicular cancer. The large necrotic appearing left medial external iliac lymph node is likely compressing the left external iliac vein and causing deep venous thrombosis extending down into the left groin. This does not extend down into the left thigh. 2. Marked and diffuse subcutaneous soft tissue swelling/edema/fluid along with skin thickening and skin blisters involving the left thigh. This could be due to the deep venous thrombosis or  possibly could be secondary cellulitis and myofasciitis. No discrete drainable abscess is identified. 3. No definite CT findings for septic arthritis or osteomyelitis and no findings for metastatic disease involving the lung bases or bony structures. Electronically Signed   By: Marijo Sanes M.D.   On: 10/10/2017 15:28   Dg Femur Min 2 Views Left  Result Date: 10/08/2017 CLINICAL DATA:  Fever with left leg swelling. EXAM: LEFT FEMUR 2 VIEWS COMPARISON:  None. FINDINGS: There is no evidence of fracture or other focal bone lesions. Diffuse soft tissue swelling. No subcutaneous emphysema. IMPRESSION: Diffuse soft tissue swelling.  No subcutaneous emphysema. Electronically Signed   By: Titus Dubin M.D.   On: 10/08/2017 17:11   Ct Outside Films Chest  Result Date: 10/12/2017 This examination belongs to an outside facility and is stored here for comparison  purposes only.  Contact the originating outside institution for any associated report or interpretation.  Mr Outside Films Head/face  Result Date: 10/12/2017 This examination belongs to an outside facility and is stored here for comparison purposes only.  Contact the originating outside institution for any associated report or interpretation.  Ct Outside Films Body  Result Date: 10/12/2017 This examination belongs to an outside facility and is stored here for comparison purposes only.  Contact the originating outside institution for any associated report or interpretation.  Ct Outside Films Body  Result Date: 10/12/2017 This examination belongs to an outside facility and is stored here for comparison purposes only.  Contact the originating outside institution for any associated report or interpretation.    Assessment/Plan 1.  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  Recommend:  The patient has evidence of severe venous outflow obstruction and changes of left lower extremity associated with ulceration and tissue loss of the thigh.  This represents a limb threatening problem and places the patient at the risk loss of function of his limb.  Patient should undergo angiography of the lower extremities with the hope for intervention for limb salvage.  The risks and benefits as well as the alternative therapies was discussed in detail with the patient.  All questions were answered.  Patient agrees to proceed with angiography, it is been explained to the patient as well as his family that initially the filter will be placed this will be performed from the right the patient will then be repositioned supine and venous intervention of the left will be performed from the posterior approach.  Stenting of the vein is planned.  Thrombectomy will also be performed as needed.  The patient will follow up with me in the office after the procedure.    A total of 30 minutes was spent with this patient and greater than 50% was spent in counseling and coordination of care with the patient.  Discussion included the treatment options for vascular disease including indications for surgery and intervention.  Also discussed is the appropriate timing of treatment.  In addition medical therapy was discussed.  2.  Left lower extremity DVT: This is a sequela of his outflow obstruction and as noted above will be treated as part of the planned procedure.   Hortencia Pilar, MD  10/12/2017 7:59 PM

## 2017-10-12 NOTE — Progress Notes (Signed)
[facility] Progress Note  Date of admission: 10/08/2017  Hospital day:  10/12/2017  Chief Complaint: Edgar [name] is a 27 y.o. male with recurrent testicular cancer and LLE DVT who was admitted through the medical oncology clinic with cellulitiis.  Subjective: Patient feels the same.  He denies any fever.  Pain controlled.  Social History: The patient is alone today.  I spoke with his mother over the phone while in Edgar [name]'s room. Allergies: No Known Allergies  Scheduled Medications: . docusate sodium  100 mg Oral BID  . DULoxetine  60 mg Oral Daily  . magnesium oxide  400 mg Oral Daily  . nicotine  21 mg Transdermal Daily  . oxyCODONE  10 mg Oral Q12H  . potassium chloride  20 mEq Oral BID    Review of Systems: GENERAL:  Feels "ok".  No fevers, sweats or weight loss. PERFORMANCE STATUS (ECOG):  2. HEENT:  No visual changes, runny nose, sore throat, mouth sores or tenderness. Lungs: No shortness of breath or cough.  No hemoptysis. Cardiac:  No chest pain, palpitations, orthopnea, or PND. GI:  Eating.  No nausea, vomiting, diarrhea, constipation, melena or hematochezia. GU:  No urgency, frequency, dysuria, or hematuria. Musculoskeletal:  No back pain.  No joint pain.  No muscle tenderness. Extremities:  Left leg pain and swelling, not improved.  Skin:  Six lesions on left thoigh (5 anterior) and 1 posterior. No apparent change in size. Neuro:  No headache, numbness or weakness, balance or coordination issues. Endocrine:  No diabetes, thyroid issues, hot flashes or night sweats. Psych:  No mood changes, depression or anxiety. Pain:  Left leg pain, controlled. Review of systems:  All other systems reviewed and found to be negative.   Physical Exam: Blood pressure 120/70, pulse 88, temperature 98 F (36.7 C), temperature source Oral, resp. rate 18, height 6' 3"  (1.905 m), weight 268 lb 1.6 oz (121.6 kg), SpO2 98 %.  GENERAL:  Chronically  fatigued appearing gentleman lying flat in bed with left leg extended. MENTAL STATUS:  Alert and oriented to person, place and time. HEAD:  Dark thinning hair.  Normocephalic, atraumatic, face symmetric, no Cushingoid features. EYES:  Brown eyes.  Pupils equal round and reactive to light and accomodation.  No conjunctivitis or scleral icterus. ENT:  Oropharynx clear without lesion.  Tongue normal. Mucous membranes moist.  RESPIRATORY:  Clear to auscultation without rales, wheezes or rhonchi. CARDIOVASCULAR:  Regular rate and rhythm without murmur, rub or gallop. ABDOMEN:  Soft, non-tender, with active bowel sounds, and no hepatosplenomegaly.  No masses. SKIN:  Left leg edematous, but not tense from groin to knee.  Dense edema proximally. Tender to touch.  Skin pink.  6 hemorrhagic bullae (largest 8.5 cm)  Additional lesions range in size from 5 x 7.5 cm and 6.5 cm. EXTREMITIES: Left leg edema (groin to knee). NEUROLOGICAL: Unremarkable. PSYCH:  Appropriate.    Results for orders placed or performed during the hospital encounter of 10/08/17 (from the past 48 hour(s))  APTT     Status: Abnormal   Collection Time: 10/10/17  6:49 PM  Result Value Ref Range   aPTT 73 (H) 24 - 36 seconds    Comment:        IF BASELINE aPTT IS ELEVATED, SUGGEST PATIENT RISK ASSESSMENT BE USED TO DETERMINE APPROPRIATE ANTICOAGULANT THERAPY. Performed at [facility], [address] [place], [place] [address]   CBC     Status: Abnormal   Collection Time:  10/11/17  1:04 AM  Result Value Ref Range   WBC 14.0 (H) 3.8 - 10.6 K/uL   RBC 2.73 (L) 4.40 - 5.90 MIL/uL   Hemoglobin 8.7 (L) 13.0 - 18.0 g/dL   HCT 24.0 (L) 40.0 - 52.0 %   MCV 88.2 80.0 - 100.0 fL   MCH 31.9 26.0 - 34.0 pg   MCHC 36.2 (H) 32.0 - 36.0 g/dL   RDW 12.6 11.5 - 14.5 %   Platelets 289 150 - 440 K/uL    Comment: Performed at [facility], [address] [place], [place] [address]  Comprehensive metabolic panel      Status: Abnormal   Collection Time: 10/11/17  1:04 AM  Result Value Ref Range   Sodium 134 (L) 135 - 145 mmol/L   Potassium 3.4 (L) 3.5 - 5.1 mmol/L   Chloride 98 98 - 111 mmol/L   CO2 27 22 - 32 mmol/L   Glucose, Bld 103 (H) 70 - 99 mg/dL   BUN 10 6 - 20 mg/dL   Creatinine, Ser 0.81 0.61 - 1.24 mg/dL   Calcium 8.2 (L) 8.9 - 10.3 mg/dL   Total Protein 5.6 (L) 6.5 - 8.1 g/dL   Albumin 2.6 (L) 3.5 - 5.0 g/dL   AST 16 15 - 41 U/L   ALT 21 0 - 44 U/L   Alkaline Phosphatase 49 38 - 126 U/L   Total Bilirubin 0.6 0.3 - 1.2 mg/dL   GFR calc non Af Amer >60 >60 mL/min   GFR calc Af Amer >60 >60 mL/min    Comment: (NOTE) The eGFR has been calculated using the CKD EPI equation. This calculation has not been validated in all clinical situations. eGFR's persistently <60 mL/min signify possible Chronic Kidney Disease.    Anion gap 9 5 - 15    Comment: Performed at [facility], [place], [place] [address]  Heparin level (unfractionated)     Status: Abnormal   Collection Time: 10/11/17  1:04 AM  Result Value Ref Range   Heparin Unfractionated 0.27 (L) 0.30 - 0.70 IU/mL    Comment: (NOTE) If heparin results are below expected values, and patient dosage has  been confirmed, suggest follow up testing of antithrombin III levels. Performed at [facility], [address] [place], [place] [address]   APTT     Status: Abnormal   Collection Time: 10/11/17  1:04 AM  Result Value Ref Range   aPTT 96 (H) 24 - 36 seconds    Comment:        IF BASELINE aPTT IS ELEVATED, SUGGEST PATIENT RISK ASSESSMENT BE USED TO DETERMINE APPROPRIATE ANTICOAGULANT THERAPY. Performed at [facility], [place], [place] [address]   Heparin level (unfractionated)     Status: Abnormal   Collection Time: 10/11/17  8:57 AM  Result Value Ref Range   Heparin Unfractionated 0.26 (L) 0.30 - 0.70 IU/mL    Comment: (NOTE) If heparin results are below expected  values, and patient dosage has  been confirmed, suggest follow up testing of antithrombin III levels. Performed at [facility], [address] [place],  [address]   Vancomycin, trough     Status: Abnormal   Collection Time: 10/11/17  2:50 PM  Result Value Ref Range   Vancomycin Tr 14 (L) 15 - 20 ug/mL    Comment: Performed at [facility], [address] [place], [place] [address]  Heparin level (unfractionated)  Status: None   Collection Time: 10/11/17  5:00 PM  Result Value Ref Range   Heparin Unfractionated 0.50 0.30 - 0.70 IU/mL    Comment: (NOTE) If heparin results are below expected values, and patient dosage has  been confirmed, suggest follow up testing of antithrombin III levels. Performed at [facility], [place], [place] [address]   Heparin level (unfractionated)     Status: None   Collection Time: 10/11/17 10:56 PM  Result Value Ref Range   Heparin Unfractionated 0.43 0.30 - 0.70 IU/mL    Comment: (NOTE) If heparin results are below expected values, and patient dosage has  been confirmed, suggest follow up testing of antithrombin III levels. Performed at [facility], [address] [place], [place] [address]   CBC     Status: Abnormal   Collection Time: 10/12/17  4:08 AM  Result Value Ref Range   WBC 10.7 (H) 3.8 - 10.6 K/uL   RBC 2.95 (L) 4.40 - 5.90 MIL/uL   Hemoglobin 9.1 (L) 13.0 - 18.0 g/dL   HCT 26.2 (L) 40.0 - 52.0 %   MCV 88.8 80.0 - 100.0 fL   MCH 30.8 26.0 - 34.0 pg   MCHC 34.7 32.0 - 36.0 g/dL   RDW 12.5 11.5 - 14.5 %   Platelets 448 (H) 150 - 440 K/uL    Comment: Performed at [facility], [place], [place] [address]  Heparin level (unfractionated)     Status: None   Collection Time: 10/12/17  4:08 AM  Result Value Ref Range   Heparin Unfractionated 0.55 0.30 - 0.70 IU/mL    Comment: (NOTE) If heparin results are below expected values, and patient  dosage has  been confirmed, suggest follow up testing of antithrombin III levels. Performed at [facility], [place], [place] [address]   Antithrombin III     Status: Abnormal   Collection Time: 10/12/17  5:25 AM  Result Value Ref Range   AntiThromb III Func 54 (L) 75 - 120 %    Comment: Performed at [facility] [address] [place], [place] [address]  Fibrinogen     Status: Abnormal   Collection Time: 10/12/17  5:25 AM  Result Value Ref Range   Fibrinogen 731 (H) 210 - 475 mg/dL    Comment: Performed at [facility], [address] [place], [place] [address]   Ct Outside Films Chest  Result Date: 10/12/2017 This examination belongs to an outside facility and is stored here for comparison purposes only.  Contact the originating outside institution for any associated report or interpretation.  Mr Outside Films Head/face  Result Date: 10/12/2017 This examination belongs to an outside facility and is stored here for comparison purposes only.  Contact the originating outside institution for any associated report or interpretation.  Ct Outside Films Body  Result Date: 10/12/2017 This examination belongs to an outside facility and is stored here for comparison purposes only.  Contact the originating outside institution for any associated report or interpretation.  Ct Outside Films Body  Result Date: 10/12/2017 This examination belongs to an outside facility and is stored here for comparison purposes only.  Contact the originating outside institution for any associated report or interpretation.   Assessment:  Edgar [name] is a 27 y.o. male with recurrent testicular cancer currently day 22 s/p cycle #1 TIP chemotherapy who was admitted with left lower extremity edema, progressive skin lesions, and cellulitis.  He was diagnosed with a left lower extremity DVT.  Bilateral lower extremity duplex on 09/10/2017 revealed evidence of obstruction  in the left CFV, SFJ, and proximal femoral vein.  He was on Xarelto and is currently on heparin.  He was diagnosed with cellulitis of the left lower extremity on 10/06/2017.  He was on clindamycin as an outpatient.  He had persistent fevers and worsening left lower extremity symptoms.  He is currently on vancomycin and cefepime.  He has venous congestion syndrome.  Exam reveals left thigh edema with 6 large hemorrhagic bullae.  Plan: 1.   Oncology:  Day 22 s/p cycle #1 TIP chemotherapy (last given at [facility] on 09/21/2017). Cycle #2 chemotherapy was scheduled for 10/12/2017, but postponed secondary to clinical condition.    Outside CT scans from 09/10/2017- 09/13/2017 from [facility] were compared with recent imaging in order to assess abdominal adenopathy and thrombosis.  Imaging reveals significant improvement in adenopathy with decreased pelvic sidewall disease and decreased bulk at aortic bifurcation.  Discuss plan for reinitiation of chemotherapy in 1 week if leg is healing.  Discuss plan for wound care.  2.  Hematology:  Normocytic anemia is stable.  Anemia work-up with normal B12, folate, ferritin (? elevated secondary to acute phase reactant).  Iron saturation low.    Patient off Xarelto and on IV heparin.  Heparin level being followed.  Review of imaging with comparison to images in 08/2017 at [facility] reveals no evidence of clot progression.  Clot begins at mass and extends from external iliac to common femoral vein.  No evidence of clot in leg.    Discuss plans with patient and his mother regarding plan for stent tomorrow with Dr. Delana Meyer (message left for Dr. Delana Meyer).  Patient appears to have venous congestion syndrome with skin lesions due to congestion.    Literature reviewed.  Discussed with Dr. Gaylyn Cheers.  No Xarelto induced skin changes.  Hypercoagulable work-up sent.  No evidence of DIC or HIT.  Platelet count stable.  Suspect DVT secondary to mechanical venous obstruction.  Hypercoagulable studies  sent.  Discussed plans for long term anticoagulation with patient. .   3.  Vascular:  Plan for stent placement tomorrow.  Patient and his mother are in agreement, but would like to discuss further with Dr. Delana Meyer prior to procedure.  4.  Infectious disease:  Cellulitis improved with resolution of fever and skin erythema.  No positive cultures.  Continue broad spectrum antibiotics.  5.  Pain and toxicology:  Pain currently being managed with morphine and oxycodone.    A total of (40) minutes of time was spent reviewing outside imaging and comparing to current films including discussion with radiology, face-to-face time with the patient and phone conversation with patient's mother, and discussion with Dr. Sterling Big, hematologist at [facility].  Greater than 50% of that time was spent in counseling and coordination of care.    [name], MD  10/12/2017, 2:55 PM

## 2017-10-12 NOTE — Progress Notes (Signed)
Patient ID: Edgar [name], male   DOB: April 11, 1990, 27 y.o.   MRN: [id number]     [facility] Day(s): 4.   Post op day(s):  Marland Kitchen   Interval History: Patient seen and examined, no acute events or new complaints overnight. Patient reports continue with pain without significant improvement.   Vital signs in last 24 hours: [min-max] current  Temp:  [98 F (36.7 C)-98.5 F (36.9 C)] 98.2 F (36.8 C) (07/30 0501) Pulse Rate:  [79-97] 79 (07/30 0501) Resp:  [13-20] 14 (07/30 0501) BP: (111-130)/(68-78) 111/68 (07/30 0501) SpO2:  [95 %-100 %] 100 % (07/30 0501)     Height: 6\' 3"  (190.5 cm) Weight: 121.6 kg (268 lb 1.6 oz) BMI (Calculated): 33.51    Physical Exam:  Constitutional: alert, cooperative and no distress  Respiratory: breathing non-labored at rest  Cardiovascular: regular rate and sinus rhythm  Gastrointestinal: soft, non-tender, and non-distended Extremity: left thigh with multiple necrotic bullate, now spontaneously draining. Tender to palpation, severe edema. Complete range of motion with significant pain specially with extension.   Labs:  CBC Latest Ref Rng & Units 10/12/2017 10/11/2017 10/10/2017  WBC 3.8 - 10.6 K/uL 10.7(H) 14.0(H) 12.5(H)  Hemoglobin 13.0 - 18.0 g/dL 9.1(L) 8.7(L) 8.6(L)  Hematocrit 40.0 - 52.0 % 26.2(L) 24.0(L) 25.3(L)  Platelets 150 - 440 K/uL 448(H) 289 212   CMP Latest Ref Rng & Units 10/11/2017 10/09/2017 10/08/2017  Glucose 70 - 99 mg/dL 103(H) 100(H) 117(H)  BUN 6 - 20 mg/dL 10 10 10   Creatinine 0.61 - 1.24 mg/dL 0.81 0.92 0.95  Sodium 135 - 145 mmol/L 134(L) 136 133(L)  Potassium 3.5 - 5.1 mmol/L 3.4(L) 3.6 3.6  Chloride 98 - 111 mmol/L 98 101 97(L)  CO2 22 - 32 mmol/L 27 29 28   Calcium 8.9 - 10.3 mg/dL 8.2(L) 8.1(L) 8.5(L)  Total Protein 6.5 - 8.1 g/dL 5.6(L) - 6.4(L)  Total Bilirubin 0.3 - 1.2 mg/dL 0.6 - 0.6  Alkaline Phos 38 - 126 U/L 49 - 54  AST 15 - 41 U/L 16 - 14(L)  ALT 0 - 44 U/L 21 - 12    Imaging studies: No  new pertinent imaging studies   Assessment/Plan:  27 y.o. male patient admitted due to leg cellulitis. On physical exam it looks like it is not responding to the antibiotic therapy because the leg is getting more swollen. I consider that the problem may be from complication of the DVT that may be causing some sort of phlegmasia. I reviewed the note from the vascular surgeon who agree and confirm that the main problem of this patient thigh swelling and bullae is the venous outflow obstruction. I agree that the patient will benefit of a procedure to restore the outflow. Will sign off of the case. Re consult if needed.    Arnold Long, MD

## 2017-10-12 NOTE — Progress Notes (Signed)
[facility] for Heparin drip Indication: DVT  No Known Allergies  Patient Measurements: Height: 6\' 3"  (190.5 cm) Weight: 268 lb 1.6 oz (121.6 kg) IBW/kg (Calculated) : 84.5 Heparin Dosing Weight: 110 kg  Vital Signs: Temp: 98.2 F (36.8 C) (07/30 0501) Temp Source: Oral (07/30 0501) BP: 111/68 (07/30 0501) Pulse Rate: 79 (07/30 0501)  Labs: Recent Labs    10/09/17 0728  10/10/17 0015 10/10/17 0753 10/10/17 1849 10/11/17 0104  10/11/17 1700 10/11/17 2256 10/12/17 0408  HGB 8.9*  --  8.6*  --   --  8.7*  --   --   --  9.1*  HCT 26.3*  --  25.3*  --   --  24.0*  --   --   --  26.2*  PLT 170  --  212  --   --  289  --   --   --  448*  APTT 48*   < > 58* 59* 73* 96*  --   --   --   --   HEPARINUNFRC 0.40  --  0.16*  --   --  0.27*   < > 0.50 0.43 0.55  CREATININE 0.92  --   --   --   --  0.81  --   --   --   --    < > = values in this interval not displayed.    Estimated Creatinine Clearance: 189 mL/min (by C-G formula based on SCr of 0.81 mg/dL).   Medical History: Past Medical History:  Diagnosis Date  . Asthma    AS A CHILD-NO INHALERS  . Cancer ([place])    testicular cancer 11/2016 per pt   . GERD (gastroesophageal reflux disease)    TUMS PRN    Medications:  Patient was taking Xarelto prior to admission for DVT. Last dose was on 10/07/17 but note states that patient was unable to keep down.  Assessment: Patient is a 27yo male admitted for LE cellulitis. Pharmacy consulted for transition to Heparin infusion. Baseline Heparin level is elevated at 3.16 so will need to use aPTT to guide therapy    Goal of Therapy:  Heparin level 0.3-0.7 units/ml  Monitor platelets by anticoagulation protocol: Yes   Plan:  Will bolus heparin 1650 units IV once and increase infusion to 2750 units/hr. Wil recheck a HL in 6 hours.   10/11/17 17:00 HL therapeutic x 1. Continue current rate. Will recheck HL in 6 hours.  07/30 @ 0400 HL 0.55  therapeutic. Will continue current rate and will recheck w/ am labs.  Tobie Lords, PharmD, BCPS Clinical Pharmacist 10/12/2017 5:37 AM

## 2017-10-12 NOTE — Progress Notes (Signed)
Nurse indicated that the patient and his family are suffering (due to patient's diagnosis and current state) and may benefit from a visit with the chaplain, although she was unsure if they would be open to it. Upon entering the room, chaplain noted that the patient was lying on his bed watching TV. He appeared to be experiencing pain/discomfort. Family member was in the bathroom. The patient said he was not in need of anything at the moment, but would call if necessary. Patient said it will take a while for him to "get back to normal". Chaplain shared words of encouragement as he navigates this difficult journey.    10/12/17 1100  Clinical Encounter Type  Visited With Patient  Visit Type Initial  Referral From Nurse

## 2017-10-13 ENCOUNTER — Inpatient Hospital Stay: Payer: BLUE CROSS/BLUE SHIELD | Admitting: Anesthesiology

## 2017-10-13 ENCOUNTER — Encounter
Admission: AD | Disposition: A | Discharge status: Home Health | Payer: Self-pay | Source: Ambulatory Visit | Attending: Internal Medicine

## 2017-10-13 DIAGNOSIS — I82402 Acute embolism and thrombosis of unspecified deep veins of left lower extremity: Secondary | ICD-10-CM

## 2017-10-13 DIAGNOSIS — L98491 Non-pressure chronic ulcer of skin of other sites limited to breakdown of skin: Secondary | ICD-10-CM

## 2017-10-13 HISTORY — PX: PERIPHERAL VASCULAR THROMBECTOMY: CATH118306

## 2017-10-13 LAB — BETA-2-GLYCOPROTEIN I ABS, IGG/M/A
Beta-2 Glyco I IgG: <9 GPI IgG units (ref 0–20)
Beta-2-Glycoprotein I IgA: <9 GPI IgA units (ref 0–25)
Beta-2-Glycoprotein I IgM: <9 GPI IgM units (ref 0–32)

## 2017-10-13 LAB — BASIC METABOLIC PANEL
ANION GAP: 8 (ref 5–15)
BUN: 8 mg/dL (ref 6–20)
CALCIUM: 8.9 mg/dL (ref 8.9–10.3)
CO2: 28 mmol/L (ref 22–32)
Chloride: 104 mmol/L (ref 98–111)
Creatinine, Ser: 0.85 mg/dL (ref 0.61–1.24)
GFR calc Af Amer: >60 mL/min (ref >60)
GLUCOSE: 117 mg/dL — AB (ref 70–99)
Potassium: 4 mmol/L (ref 3.5–5.1)
Sodium: 140 mmol/L (ref 135–145)

## 2017-10-13 LAB — PROTEIN C, TOTAL: Protein C, Total: 78 % (ref 60–150)

## 2017-10-13 LAB — CARDIOLIPIN ANTIBODIES, IGG, IGM, IGA
Anticardiolipin IgA: <9 APL U/mL (ref 0–11)
Anticardiolipin IgG: <9 GPL U/mL (ref 0–14)
Anticardiolipin IgM: 12 MPL U/mL (ref 0–12)

## 2017-10-13 LAB — HEPARIN LEVEL (UNFRACTIONATED)
HEPARIN UNFRACTIONATED: 0.53 [IU]/mL (ref 0.30–0.70)
HEPARIN UNFRACTIONATED: 0.42 [IU]/mL (ref 0.30–0.70)

## 2017-10-13 SURGERY — PERIPHERAL VASCULAR THROMBECTOMY
Anesthesia: General | Laterality: Left

## 2017-10-13 MED ORDER — SUCCINYLCHOLINE CHLORIDE 20 MG/ML IJ SOLN
INTRAMUSCULAR | Status: DC | PRN
  Administered 2017-10-13: 120 mg via INTRAVENOUS

## 2017-10-13 MED ORDER — SEVOFLURANE IN SOLN
RESPIRATORY_TRACT | Status: AC
  Filled 2017-10-13: qty 250

## 2017-10-13 MED ORDER — HEPARIN SODIUM (PORCINE) 1000 UNIT/ML IJ SOLN
INTRAMUSCULAR | Status: DC | PRN
  Administered 2017-10-13: 4000 [IU] via INTRAVENOUS

## 2017-10-13 MED ORDER — HEPARIN (PORCINE) IN NACL 100-0.45 UNIT/ML-% IJ SOLN
2650.0000 [IU]/h | INTRAMUSCULAR | Status: DC
  Administered 2017-10-13 – 2017-10-14 (×4): 2750 [IU]/h via INTRAVENOUS
  Filled 2017-10-13 (×5): qty 250

## 2017-10-13 MED ORDER — MIDAZOLAM HCL 2 MG/2ML IJ SOLN
INTRAMUSCULAR | Status: AC
  Filled 2017-10-13: qty 2

## 2017-10-13 MED ORDER — FENTANYL CITRATE (PF) 100 MCG/2ML IJ SOLN
INTRAMUSCULAR | Status: AC
  Filled 2017-10-13: qty 2

## 2017-10-13 MED ORDER — DEXAMETHASONE SODIUM PHOSPHATE 10 MG/ML IJ SOLN
INTRAMUSCULAR | Status: DC | PRN
  Administered 2017-10-13: 5 mg via INTRAVENOUS

## 2017-10-13 MED ORDER — ONDANSETRON HCL 4 MG/2ML IJ SOLN
INTRAMUSCULAR | Status: DC | PRN
  Administered 2017-10-13: 4 mg via INTRAVENOUS

## 2017-10-13 MED ORDER — HEPARIN SODIUM (PORCINE) 1000 UNIT/ML IJ SOLN
INTRAMUSCULAR | Status: AC
  Filled 2017-10-13: qty 1

## 2017-10-13 MED ORDER — HEPARIN (PORCINE) IN NACL 1000-0.9 UT/500ML-% IV SOLN
INTRAVENOUS | Status: AC
Start: 2017-10-13 — End: ?
  Filled 2017-10-13: qty 1000

## 2017-10-13 MED ORDER — SUGAMMADEX SODIUM 500 MG/5ML IV SOLN
INTRAVENOUS | Status: DC | PRN
  Administered 2017-10-13: 250 mg via INTRAVENOUS

## 2017-10-13 MED ORDER — ONDANSETRON HCL 4 MG/2ML IJ SOLN
INTRAMUSCULAR | Status: AC
  Filled 2017-10-13: qty 2

## 2017-10-13 MED ORDER — DEXMEDETOMIDINE HCL 200 MCG/2ML IV SOLN
INTRAVENOUS | Status: DC | PRN
  Administered 2017-10-13: 12 ug via INTRAVENOUS
  Administered 2017-10-13: 8 ug via INTRAVENOUS

## 2017-10-13 MED ORDER — ROCURONIUM BROMIDE 100 MG/10ML IV SOLN
INTRAVENOUS | Status: DC | PRN
  Administered 2017-10-13: 5 mg via INTRAVENOUS
  Administered 2017-10-13: 50 mg via INTRAVENOUS
  Administered 2017-10-13: 45 mg via INTRAVENOUS

## 2017-10-13 MED ORDER — FENTANYL CITRATE (PF) 100 MCG/2ML IJ SOLN
50.0000 ug | Freq: Once | INTRAMUSCULAR | Status: AC
  Administered 2017-10-13: 50 ug via INTRAVENOUS

## 2017-10-13 MED ORDER — LIDOCAINE HCL (CARDIAC) PF 100 MG/5ML IV SOSY
PREFILLED_SYRINGE | INTRAVENOUS | Status: DC | PRN
  Administered 2017-10-13: 100 mg via INTRAVENOUS

## 2017-10-13 MED ORDER — FENTANYL CITRATE (PF) 100 MCG/2ML IJ SOLN
25.0000 ug | INTRAMUSCULAR | Status: AC | PRN
  Administered 2017-10-13 (×6): 25 ug via INTRAVENOUS

## 2017-10-13 MED ORDER — MORPHINE SULFATE (PF) 4 MG/ML IV SOLN
3.0000 mg | INTRAVENOUS | Status: DC | PRN
  Administered 2017-10-13 – 2017-10-16 (×7): 4 mg via INTRAVENOUS
  Filled 2017-10-13 (×7): qty 1

## 2017-10-13 MED ORDER — FENTANYL CITRATE (PF) 100 MCG/2ML IJ SOLN
INTRAMUSCULAR | Status: AC
  Administered 2017-10-13: 25 ug via INTRAVENOUS
  Filled 2017-10-13: qty 2

## 2017-10-13 MED ORDER — DEXAMETHASONE SODIUM PHOSPHATE 10 MG/ML IJ SOLN
INTRAMUSCULAR | Status: AC
  Filled 2017-10-13: qty 1

## 2017-10-13 MED ORDER — PROPOFOL 10 MG/ML IV BOLUS
INTRAVENOUS | Status: DC | PRN
  Administered 2017-10-13: 200 mg via INTRAVENOUS

## 2017-10-13 MED ORDER — MIDAZOLAM HCL 2 MG/2ML IJ SOLN
INTRAMUSCULAR | Status: DC | PRN
  Administered 2017-10-13: 2 mg via INTRAVENOUS

## 2017-10-13 MED ORDER — SUGAMMADEX SODIUM 500 MG/5ML IV SOLN
INTRAVENOUS | Status: AC
Start: 2017-10-13 — End: ?
  Filled 2017-10-13: qty 5

## 2017-10-13 MED ORDER — FENTANYL CITRATE (PF) 100 MCG/2ML IJ SOLN
INTRAMUSCULAR | Status: DC | PRN
  Administered 2017-10-13: 50 ug via INTRAVENOUS
  Administered 2017-10-13 (×2): 25 ug via INTRAVENOUS
  Administered 2017-10-13 (×2): 50 ug via INTRAVENOUS
  Administered 2017-10-13: 100 ug via INTRAVENOUS

## 2017-10-13 MED ORDER — LIDOCAINE-EPINEPHRINE (PF) 1 %-1:200000 IJ SOLN
INTRAMUSCULAR | Status: AC
  Filled 2017-10-13: qty 30

## 2017-10-13 MED ORDER — PROPOFOL 10 MG/ML IV BOLUS
INTRAVENOUS | Status: AC
  Filled 2017-10-13: qty 20

## 2017-10-13 MED ORDER — LIDOCAINE HCL (PF) 1 % IJ SOLN
INTRAMUSCULAR | Status: AC
  Filled 2017-10-13: qty 30

## 2017-10-13 MED ORDER — IOPAMIDOL (ISOVUE-300) INJECTION 61%
INTRAVENOUS | Status: DC | PRN
  Administered 2017-10-13: 80 mL via INTRAVENOUS

## 2017-10-13 MED ORDER — SILVER SULFADIAZINE 1 % EX CREA
TOPICAL_CREAM | Freq: Two times a day (BID) | CUTANEOUS | Status: DC
  Administered 2017-10-13 – 2017-10-16 (×7): via TOPICAL
  Filled 2017-10-13 (×2): qty 85

## 2017-10-13 MED ORDER — ONDANSETRON HCL 4 MG/2ML IJ SOLN: 4.0000 mg | Freq: Once | INTRAMUSCULAR | Status: DC | PRN

## 2017-10-13 SURGICAL SUPPLY — 25 items
BALLN ATG 16X6X80 (BALLOONS) ×2
BALLN ATG 18X4X80 (BALLOONS) ×2
BALLN MUSTANG 10X80X135 (BALLOONS) ×2
BALLOON ATG 16X6X80 (BALLOONS) ×1 IMPLANT
BALLOON ATG 18X4X80 (BALLOONS) ×1 IMPLANT
BALLOON MUSTANG 10X80X135 (BALLOONS) ×1 IMPLANT
CANISTER PENUMBRA ENGINE (MISCELLANEOUS) ×2 IMPLANT
CATH BEACON 5 .035 65 KMP TIP (CATHETERS) ×2 IMPLANT
CATH INDIGO CAT8 TORQ 85 KIT (CATHETERS) ×2 IMPLANT
CATH INDIGO SEP 8 (CATHETERS) ×2 IMPLANT
COVER EZ STRL 42X30 (DRAPES) ×2 IMPLANT
DEVICE PRESTO INFLATION (MISCELLANEOUS) ×2 IMPLANT
DEVICE SAFEGUARD 24CM (GAUZE/BANDAGES/DRESSINGS) ×2 IMPLANT
GLIDEWIRE STIFF .35X180X3 HYDR (WIRE) ×2 IMPLANT
KIT FEMORAL DEL DENALI (Miscellaneous) ×2 IMPLANT
NEEDLE ENTRY 21GA 7CM ECHOTIP (NEEDLE) ×4 IMPLANT
PACK ANGIOGRAPHY (CUSTOM PROCEDURE TRAY) ×2 IMPLANT
SET INTRO CAPELLA COAXIAL (SET/KITS/TRAYS/PACK) ×4 IMPLANT
SHEATH BRITE TIP 10FRX11 (SHEATH) ×2 IMPLANT
SHEATH BRITE TIP 8FRX11 (SHEATH) ×2 IMPLANT
STENT VENOVO 18X120X80 (Permanent Stent) ×2 IMPLANT
SYR MEDRAD MARK V 150ML (SYRINGE) ×2 IMPLANT
TUBING CONTRAST HIGH PRESS 72 (TUBING) ×2 IMPLANT
WIRE J 3MM .035X145CM (WIRE) ×2 IMPLANT
WIRE MAGIC TORQUE 260C (WIRE) ×2 IMPLANT

## 2017-10-13 NOTE — Progress Notes (Signed)
ANTICOAGULATION CONSULT NOTE  Pharmacy Consult for Heparin drip Indication: DVT  No Known Allergies  Patient Measurements: Height: 6\' 3"  (190.5 cm) Weight: 268 lb (121.6 kg) IBW/kg (Calculated) : 84.5 Heparin Dosing Weight: 110 kg  Vital Signs: Temp: 97.7 F (36.5 C) (07/31 2034) Temp Source: Oral (07/31 2034) BP: 111/75 (07/31 2034) Pulse Rate: 88 (07/31 2034)  Labs: Recent Labs    10/11/17 0104  10/12/17 0408 10/13/17 0326 10/13/17 1624 10/13/17 2018  HGB 8.7*  --  9.1*  --   --   --   HCT 24.0*  --  26.2*  --   --   --   PLT 289  --  448*  --   --   --   APTT 96*  --   --   --   --   --   HEPARINUNFRC 0.27*   < > 0.55 0.53  --  0.42  CREATININE 0.81  --   --   --  0.85  --    < > = values in this interval not displayed.    Estimated Creatinine Clearance: 180.1 mL/min (by C-G formula based on SCr of 0.85 mg/dL).   Medical History: Past Medical History:  Diagnosis Date  . Asthma    AS A CHILD-NO INHALERS  . Cancer ([place])    testicular cancer 11/2016 per pt   . GERD (gastroesophageal reflux disease)    TUMS PRN    Medications:  Patient was taking Xarelto prior to admission for DVT. Last dose was on 10/07/17 but note states that patient was unable to keep down.  Assessment: Patient is a 27yo male admitted for LE cellulitis. Pharmacy consulted for transition to Heparin infusion. Baseline Heparin level is elevated at 3.16 so will need to use aPTT to guide therapy  Goal of Therapy:  Heparin level 0.3-0.7 units/ml  Monitor platelets by anticoagulation protocol: Yes   Plan:  7/31 Heparin stopped for Left peripheral thrombectomy.  Infusion restarted at rate of 2750 units/hr at 1400. No Bolus. Will check HL in 6 hours.    7/31 HL @ 20:18 = 0.42 Will continue this pt on current rate and recheck HL on 8/1 with AM labs.   Orene Desanctis, PharmD Clinical Pharmacist 10/13/2017 9:12 PM

## 2017-10-13 NOTE — Progress Notes (Signed)
[place] [facility] [place] NAME: Edgar [name]    MR#:  [id number]  DATE OF BIRTH:  [birth year]  SUBJECTIVE:  CHIEF COMPLAINT:  No chief complaint on file.  -Patient diagnosed with left leg DVT and was started on anticoagulation recently.  Had swelling before he came to the hospital.  Developed superficial skin blisters that were oozing. -Going for angiogram, possible thrombectomy and venous stent placement  REVIEW OF SYSTEMS:  Review of Systems  Constitutional: Negative for chills, fever and malaise/fatigue.  HENT: Negative for congestion, ear discharge, hearing loss and nosebleeds.   Eyes: Negative for blurred vision and double vision.  Respiratory: Negative for cough, shortness of breath and wheezing.   Cardiovascular: Negative for chest pain and palpitations.  Gastrointestinal: Negative for abdominal pain, constipation, diarrhea, nausea and vomiting.  Genitourinary: Negative for dysuria and urgency.  Musculoskeletal: Positive for myalgias.  Neurological: Negative for dizziness, focal weakness, seizures, weakness and headaches.  Psychiatric/Behavioral: Negative for depression.    DRUG ALLERGIES:  No Known Allergies  VITALS:  Blood pressure 125/84, pulse 64, temperature (!) 97.5 F (36.4 C), resp. rate 13, height 6\' 3"  (1.905 m), weight 121.6 kg (268 lb), SpO2 99 %.  PHYSICAL EXAMINATION:  Physical Exam   GENERAL:  27 y.o.-year-old patient lying in the bed with no acute distress.  EYES: Pupils equal, round, reactive to light and accommodation. No scleral icterus. Extraocular muscles intact.  HEENT: Head atraumatic, normocephalic. Oropharynx and nasopharynx clear.  NECK:  Supple, no jugular venous distention. No thyroid enlargement, no tenderness.  LUNGS: Normal breath sounds bilaterally, no wheezing, rales,rhonchi or crepitation. No use of accessory muscles of respiration.  CARDIOVASCULAR: S1, S2 normal. No murmurs, rubs, or gallops.    ABDOMEN: Soft, nontender, nondistended. Bowel sounds present. No organomegaly or mass.  EXTREMITIES: Significantly swollen left thigh, saturated dressing in place, extremely tender to touch.  No cyanosis, or clubbing.  NEUROLOGIC: Cranial nerves II through XII are intact. Muscle strength 5/5 in all extremities. Sensation intact. Gait not checked.  PSYCHIATRIC: The patient is alert and oriented x 3.  SKIN: No obvious rash, lesion, or ulcer.    LABORATORY PANEL:   CBC Recent Labs  Lab 10/12/17 0408  WBC 10.7*  HGB 9.1*  HCT 26.2*  PLT 448*   ------------------------------------------------------------------------------------------------------------------  Chemistries  Recent Labs  Lab 10/11/17 0104  NA 134*  K 3.4*  CL 98  CO2 27  GLUCOSE 103*  BUN 10  CREATININE 0.81  CALCIUM 8.2*  AST 16  ALT 21  ALKPHOS 49  BILITOT 0.6   ------------------------------------------------------------------------------------------------------------------  Cardiac Enzymes No results for input(s): TROPONINI in the last 168 hours. ------------------------------------------------------------------------------------------------------------------  RADIOLOGY:  Ct Outside Films Chest  Result Date: 10/12/2017 This examination belongs to an outside facility and is stored here for comparison purposes only.  Contact the originating outside institution for any associated report or interpretation.  Mr Outside Films Head/face  Result Date: 10/12/2017 This examination belongs to an outside facility and is stored here for comparison purposes only.  Contact the originating outside institution for any associated report or interpretation.  Ct Outside Films Body  Result Date: 10/12/2017 This examination belongs to an outside facility and is stored here for comparison purposes only.  Contact the originating outside institution for any associated report or interpretation.  Ct Outside Films  Body  Result Date: 10/12/2017 This examination belongs to an outside facility and is stored here for comparison purposes only.  Contact the originating outside  institution for any associated report or interpretation.   EKG:   Orders placed or performed in visit on 02/27/07  . EKG 12-Lead    ASSESSMENT AND PLAN:   27 year old male with past medical history significant for metastatic testicular cancer, left leg DVT with worsening edema and blisters.  1.  Left lower extremity cellulitis with blisters-CT venogram consistent with large pelvic and retroperitoneal chronic lymph nodes with compression on external iliac vein, also has left leg deep venous thrombosis. -On IV antibiotics with vancomycin and cefepime. -Appreciate vascular consult. - on heparin drip -Plan for angiogram, thrombectomy may be an likely venous stent placement to improve the swelling and healing of the ulcers.  2.  Left leg DVT-currently on heparin drip.  3.  Metastatic testicular cancer-following up at [facility].  Appreciate oncology consult  4.  Tobacco use disorder-counseled on admission.  On nicotine patch     All the records are reviewed and case discussed with Care Management/Social Workerr. Management plans discussed with the patient, family and they are in agreement.  CODE STATUS: Full code  TOTAL TIME TAKING CARE OF THIS PATIENT: 36 minutes.   POSSIBLE D/C IN 3-4 DAYS, DEPENDING ON CLINICAL CONDITION.   Gladstone Lighter M.D on 10/13/2017 at 2:39 PM  Between 7am to 6pm - Pager - [phone]  After 6pm go to [email or web address] - password [email or web address] [facility]  Office  [phone]  CC: Primary care physician; Patient, No Pcp Per

## 2017-10-13 NOTE — Anesthesia Post-op Follow-up Note (Signed)
Anesthesia QCDR form completed.        

## 2017-10-13 NOTE — Progress Notes (Signed)
ANTICOAGULATION CONSULT NOTE  Pharmacy Consult for Heparin drip Indication: DVT  No Known Allergies  Patient Measurements: Height: 6\' 3"  (190.5 cm) Weight: 268 lb (121.6 kg) IBW/kg (Calculated) : 84.5 Heparin Dosing Weight: 110 kg  Vital Signs: Temp: 97.5 F (36.4 C) (07/31 1313) Temp Source: Oral (07/31 0933) BP: 125/84 (07/31 1413) Pulse Rate: 64 (07/31 1413)  Labs: Recent Labs    10/10/17 1849 10/11/17 0104  10/11/17 2256 10/12/17 0408 10/13/17 0326  HGB  --  8.7*  --   --  9.1*  --   HCT  --  24.0*  --   --  26.2*  --   PLT  --  289  --   --  448*  --   APTT 73* 96*  --   --   --   --   HEPARINUNFRC  --  0.27*   < > 0.43 0.55 0.53  CREATININE  --  0.81  --   --   --   --    < > = values in this interval not displayed.    Estimated Creatinine Clearance: 189 mL/min (by C-G formula based on SCr of 0.81 mg/dL).   Medical History: Past Medical History:  Diagnosis Date  . Asthma    AS A CHILD-NO INHALERS  . Cancer ([place])    testicular cancer 11/2016 per pt   . GERD (gastroesophageal reflux disease)    TUMS PRN    Medications:  Patient was taking Xarelto prior to admission for DVT. Last dose was on 10/07/17 but note states that patient was unable to keep down.  Assessment: Patient is a 27yo male admitted for LE cellulitis. Pharmacy consulted for transition to Heparin infusion. Baseline Heparin level is elevated at 3.16 so will need to use aPTT to guide therapy  Goal of Therapy:  Heparin level 0.3-0.7 units/ml  Monitor platelets by anticoagulation protocol: Yes   Plan:  7/31 Heparin stopped for Left peripheral thrombectomy.  Infusion restarted at rate of 2750 units/hr at 1400. No Bolus. Will check HL in 6 hours.    Pernell Dupre, PharmD, BCPS Clinical Pharmacist 10/13/2017 2:32 PM

## 2017-10-13 NOTE — Progress Notes (Signed)
Dressing change performed around 4am. Cleansed wound and applied xeroform to blisters. Cover with chuck-like covering due to excessive drainage ( 2on top and 2 underneath the leg.) Pain medication given and will continue to monitor to end of shift.

## 2017-10-13 NOTE — Anesthesia Preprocedure Evaluation (Signed)
Anesthesia Evaluation  Patient identified by MRN, date of birth, ID band Patient awake    Reviewed: Allergy & Precautions, NPO status , Patient's Chart, lab work & pertinent test results  History of Anesthesia Complications Negative for: history of anesthetic complications  Airway Mallampati: III       Dental   Pulmonary asthma (as a child) , neg sleep apnea, neg COPD, Current Smoker,           Cardiovascular (-) hypertension(-) Past MI and (-) CHF (-) dysrhythmias (-) Valvular Problems/Murmurs     Neuro/Psych neg Seizures Depression    GI/Hepatic Neg liver ROS, GERD  Poorly Controlled,  Endo/Other  neg diabetes  Renal/GU negative Renal ROS     Musculoskeletal   Abdominal   Peds  Hematology   Anesthesia Other Findings   Reproductive/Obstetrics                            Anesthesia Physical Anesthesia Plan  ASA: III  Anesthesia Plan: General   Post-op Pain Management:    Induction: Intravenous  PONV Risk Score and Plan: 1 and Ondansetron and Dexamethasone  Airway Management Planned: Oral ETT  Additional Equipment:   Intra-op Plan:   Post-operative Plan:   Informed Consent: I have reviewed the patients History and Physical, chart, labs and discussed the procedure including the risks, benefits and alternatives for the proposed anesthesia with the patient or authorized representative who has indicated his/her understanding and acceptance.     Plan Discussed with:   Anesthesia Plan Comments:         Anesthesia Quick Evaluation

## 2017-10-13 NOTE — Anesthesia Postprocedure Evaluation (Signed)
Anesthesia Post Note  Patient: Edgar [name]  Procedure(s) Performed: PERIPHERAL VASCULAR THROMBECTOMY (Left )  Patient location during evaluation: PACU Anesthesia Type: General Level of consciousness: sedated Pain management: pain level controlled Vital Signs Assessment: post-procedure vital signs reviewed and stable Respiratory status: spontaneous breathing and respiratory function stable Cardiovascular status: stable Anesthetic complications: no     Last Vitals:  Vitals:   10/13/17 1335 10/13/17 1343  BP:  127/77  Pulse: 72 72  Resp: 14 10  Temp:    SpO2: 96% 97%    Last Pain:  Vitals:   10/13/17 1343  TempSrc:   PainSc: (P) 7                  KEPHART,WILLIAM K

## 2017-10-13 NOTE — Transfer of Care (Signed)
Immediate Anesthesia Transfer of Care Note  Patient: Edgar [name]  Procedure(s) Performed: PERIPHERAL VASCULAR THROMBECTOMY (Left )  Patient Location: PACU  Anesthesia Type:General  Level of Consciousness: drowsy  Airway & Oxygen Therapy: Patient Spontanous Breathing and Patient connected to face mask oxygen  Post-op Assessment: Report given to RN and Post -op Vital signs reviewed and stable  Post vital signs: Reviewed and stable    Last Vitals:  Vitals Value Taken Time  BP 120/81 10/13/2017  1:13 PM  Temp 36.4 C 10/13/2017  1:13 PM  Pulse 72 10/13/2017  1:18 PM  Resp 18 10/13/2017  1:18 PM  SpO2 100 % 10/13/2017  1:18 PM  Vitals shown include unvalidated device data.  Last Pain:  Vitals:   10/13/17 0933  TempSrc: Oral  PainSc:       Patients Stated Pain Goal: 4 (11/65/79 0383)  Complications: No apparent anesthesia complications

## 2017-10-13 NOTE — Op Note (Addendum)
[facility]                                                                               OPERATIVE NOTE    PRE-OPERATIVE DIAGNOSIS: Symptomatic left leg venous outflow obstruction; left leg DVT; testicular cancer  POST-OPERATIVE DIAGNOSIS: Same  PROCEDURE: 1. Ultrasound guidance for vascular access to theright common femoral vein and left popliteal vein 2. Catheter placement into the inferior vena cava for placement of IVC filter 3. Inferior venacavogram 4. Placement of a Denali IVC filter infrarenal 5. Introduction catheter into venous system second order catheter placement left popliteal approach 6.Percutaneous transluminal angioplasty and stent placement of the left common iliac vein and left external iliac vein 7. Percutaneous transluminal angioplasty of the left common femoral vein 8.   Mechanical thrombectomy of the left common femoral, external iliac and common iliac veins with the penumbra cat 8 device 9.   Excisional debridement of necrotic skin and dermis multiple blistered ulcerations left thigh  SURGEON: Hortencia Pilar  ASSISTANT(S): None  ANESTHESIA: General by endotracheal intubation  ESTIMATED BLOOD LOSS: minimal  Contrast:80  Fluoroscopy time:14.6  FINDING(S): 1. Patent IVC; thrombus within the left popliteal, superficial femoral vein and common femoral vein.  There are 6 blistered areas each roughly the same size and circular measuring 3 cm in diameter.  These are essentially a deep dermal injury analogous to a second degree burn.  This represents less than 10% of the patient's total body surface area.  SPECIMEN(S): none  INDICATIONS:  Edgar [name] is a 27 y.o. year old male who presents with massive swelling of the left leg quite painful in association with pleuritic chest pains and hypoxia as well as shortness of breath. Inferior vena cava  filter is indicated for this reason. Risks and benefits including filter thrombosis, migration, fracture, bleeding, and infection were all discussed. We discussed that all IVC filters that we place can be removed if desired from the patient once the need for the filter has passed.   DESCRIPTION: After obtaining full informed written consent, the patient was brought back to the vascular suite. The skin was sterilely prepped and draped in a sterile surgical field was created.  Ultrasound was placed in a sterile sleeve.  The right common femoral vein was image with the ultrasound.  It was echolucent and compressible indicating patency.  Image was recorded for the permanent record. The right common femoral vein was accessed under direct ultrasound guidance without difficulty with a micropuncture needle and a microwire was advanced without difficulty.   A microsheath was then inserted and then a J-wire was then placed. The dilator is passed over the wire and the delivery sheath was placed into the inferior vena cava. Inferior venacavogram was performed. This demonstrated a patent IVC with the level of the renal veins  at L1-L2. The filter was then deployed into the inferior vena cava at the level of inferior margin of L2 just below the renal veins. The delivery sheath was then removed. Pressure was held. Sterile dressings were placed. The patient tolerated the procedure well.  The patient was then repositioned to the prone and the popliteal fossa of the left leg was prepped and draped in a sterile fashion. Ultrasound was placed in a sterile sleeve. Ultrasound is utilized to gain access to the popliteal vein. Vein is known to have thrombus within it by previous ultrasound. It is therefore identified by its enlarged size with heterogenous thrombus and non-compressibility. 1% lidocaine is infiltrated in soft tissues and subsequent microneedle is inserted into the popliteal vein without difficulty. Images recorded  for the permanent record and the puncture is made under real-time visualization. Microwire is then advanced under fluoroscopic guidance and noted to follow the path of the superficial femoral vein. Therefore the micro-sheath was placed followed by a Magic torque wire and subsequently an 8 [place] sheath.  KMP catheter together with the Magic torque wire then advanced up to the level of the common iliac and hand injection contrast is utilized to demonstrate that the proximal common iliac vein on the left is patent the inferior vena cava is known to be patent from the above filter placement. Catheter is then repositioned to the common femoral level and hand injection contrast demonstrates that there is near occlusive thrombus within the common femoral with occlusion of the external iliac.  Thrombus is noted in the distal portion of the common iliac vein as well.    The penumbra cat 8 device is then prepped on the field and advanced over the wire up to the common femoral.  Multiple passes are made both with and without the separator including the common femoral as well as the external iliac vein and the common iliac vein.  Periodically follow-up venography is performed and where persistent thrombus is noted the penumbra catheter is focused in this area.  Follow-up imaging now demonstrates that there is now flow through the external iliac.  And external compression is now well visualized impinging upon essentially through its entire course and extending into the distal portion of the common iliac.  A 10 mm x 80 mm [place] balloon it is then advanced over the wire and angioplasty is begun from the common femoral up through the common iliac.  Inflations are for 83mnute to 20 atm.  Follow-up imaging now demonstrates a moderate amount of thrombus within the proximal common femoral severe impingement of the external iliac and distal common iliac remains.  A 18 mm x 120 mm Venovo stent is then deployed from the mid  common iliac down to the distal external iliac.  I then postdilated it with a 16 mm x 60 mm Atlas balloon inflations are to 18 to 20 atm for 30 seconds.  Follow-up imaging now demonstrates patency of the iliac system however there is persistent narrowing in the distal common iliac proximal external iliac and as noted above thrombus within the common femoral.  The penumbra catheter is then reintroduced and the area of thrombus within the common femoral was readdressed.  This does not change the thrombus burden and therefore I elected to take a 18 mm x 60 mm Atlas balloon and inflation in the common femoral was performed.  A second inflation in the common iliac was also performed.  Both inflations were to 8 atm for 30 seconds.   After  this maneuver follow-up imaging demonstrates near total resolution within the common femoral vein. Iliac veins are unchanged and are magnified image of the IVC and filter demonstrates that the IVC and the filter remained widely patent with no evidence of thrombus within the filter. Given that the patient now is widely patent with minimal residual thrombus from the distal popliteal to the IVC the procedure is terminated. The wires removed the sheath is removed pressures held and a pressure dressing with Coban and is then applied. The patient is then returned to the supine position.  The blistered areas on his left thigh were then addressed.  The are covered with necrotic skin.  Using a 11 blade scalpel and scissors the necrotic skin and necrotic dermal tissue is debrided.  Bleeding is controlled with Bovie cautery and direct pressure.  There are 6 lesions each roughly the same size and circular each approximately 3 cm in diameter this represents less than 10% total body surface area.  Once the necrotic skin and necrotic dermal tissue has been debrided and hemostasis has been obtained they were cleansed with saline and then covered with Silvadene nonadherent dressing gauze and  wrapped.  Interpretation: Initial images demonstrated normal vena cava filter is placed without difficulty. Initial images the left lower extremity then demonstrated thrombus throughout femoral and iliac veins.  External compression of the iliac veins is noted as expected based on the findings from CT. Following intervention described with angioplasty to 18 mm of the common femoral and angioplasty and stenting of both the external iliac as well as the common iliac veins in association with mechanical thrombectomy of all 3 segments above there is near-total resolution of thrombus throughout.  COMPLICATIONS: None  CONDITION: Stable  Hortencia Pilar  10/13/2017,11:15 AM

## 2017-10-13 NOTE — Anesthesia Procedure Notes (Addendum)
Procedure Name: Intubation Date/Time: 10/13/2017 10:48 AM Performed by: Gunnar Fusi, MD Pre-anesthesia Checklist: Patient identified, Emergency Drugs available, Suction available, Patient being monitored and Timeout performed Patient Re-evaluated:Patient Re-evaluated prior to induction Oxygen Delivery Method: Circle system utilized Preoxygenation: Pre-oxygenation with 100% oxygen Induction Type: IV induction Ventilation: Mask ventilation without difficulty Laryngoscope Size: Mac and 4 Grade View: Grade I Tube type: Oral Tube size: 7.5 mm Number of attempts: 1 Airway Equipment and Method: Stylet Placement Confirmation: ETT inserted through vocal cords under direct vision Secured at: 23 cm Tube secured with: Tape Dental Injury: Teeth and Oropharynx as per pre-operative assessment  Difficulty Due To: Difficulty was unanticipated

## 2017-10-13 NOTE — Plan of Care (Signed)

## 2017-10-13 NOTE — Progress Notes (Signed)
ANTICOAGULATION CONSULT NOTE  Pharmacy Consult for Heparin drip Indication: DVT  No Known Allergies  Patient Measurements: Height: 6\' 3"  (190.5 cm) Weight: 268 lb 1.6 oz (121.6 kg) IBW/kg (Calculated) : 84.5 Heparin Dosing Weight: 110 kg  Vital Signs: Temp: 98.6 F (37 C) (07/30 2013) Temp Source: Oral (07/30 2013) BP: 121/71 (07/30 2013) Pulse Rate: 88 (07/30 2013)  Labs: Recent Labs    10/10/17 0753 10/10/17 1849 10/11/17 0104  10/11/17 2256 10/12/17 0408 10/13/17 0326  HGB  --   --  8.7*  --   --  9.1*  --   HCT  --   --  24.0*  --   --  26.2*  --   PLT  --   --  289  --   --  448*  --   APTT 59* 73* 96*  --   --   --   --   HEPARINUNFRC  --   --  0.27*   < > 0.43 0.55 0.53  CREATININE  --   --  0.81  --   --   --   --    < > = values in this interval not displayed.    Estimated Creatinine Clearance: 189 mL/min (by C-G formula based on SCr of 0.81 mg/dL).   Medical History: Past Medical History:  Diagnosis Date  . Asthma    AS A CHILD-NO INHALERS  . Cancer ([place])    testicular cancer 11/2016 per pt   . GERD (gastroesophageal reflux disease)    TUMS PRN    Medications:  Patient was taking Xarelto prior to admission for DVT. Last dose was on 10/07/17 but note states that patient was unable to keep down.  Assessment: Patient is a 27yo male admitted for LE cellulitis. Pharmacy consulted for transition to Heparin infusion. Baseline Heparin level is elevated at 3.16 so will need to use aPTT to guide therapy    Goal of Therapy:  Heparin level 0.3-0.7 units/ml  Monitor platelets by anticoagulation protocol: Yes   Plan:  Will bolus heparin 1650 units IV once and increase infusion to 2750 units/hr. Wil recheck a HL in 6 hours.   10/11/17 17:00 HL therapeutic x 1. Continue current rate. Will recheck HL in 6 hours.  07/30 @ 0400 HL 0.55 therapeutic. Will continue current rate and will recheck w/ am labs.  07/31 @ 0300 HL 0.53 therapeutic. Will continue  current rate and will recheck w/ am labs.  Tobie Lords, PharmD, BCPS Clinical Pharmacist 10/13/2017 5:02 AM

## 2017-10-14 ENCOUNTER — Encounter: Payer: Self-pay | Admitting: Vascular Surgery

## 2017-10-14 DIAGNOSIS — Z8547 Personal history of malignant neoplasm of testis: Secondary | ICD-10-CM

## 2017-10-14 DIAGNOSIS — I82412 Acute embolism and thrombosis of left femoral vein: Secondary | ICD-10-CM

## 2017-10-14 LAB — BASIC METABOLIC PANEL
ANION GAP: 8 (ref 5–15)
BUN: 8 mg/dL (ref 6–20)
CHLORIDE: 105 mmol/L (ref 98–111)
CO2: 28 mmol/L (ref 22–32)
Calcium: 8.4 mg/dL — ABNORMAL LOW (ref 8.9–10.3)
Creatinine, Ser: 0.7 mg/dL (ref 0.61–1.24)
GFR calc Af Amer: >60 mL/min (ref >60)
GFR calc non Af Amer: >60 mL/min (ref >60)
GLUCOSE: 133 mg/dL — AB (ref 70–99)
POTASSIUM: 3.5 mmol/L (ref 3.5–5.1)
Sodium: 141 mmol/L (ref 135–145)

## 2017-10-14 LAB — CBC
HEMATOCRIT: 23.5 % — AB (ref 40.0–52.0)
Hemoglobin: 8.2 g/dL — ABNORMAL LOW (ref 13.0–18.0)
MCH: 31.3 pg (ref 26.0–34.0)
MCHC: 35 g/dL (ref 32.0–36.0)
MCV: 89.4 fL (ref 80.0–100.0)
Platelets: 646 10*3/uL — ABNORMAL HIGH (ref 150–440)
RBC: 2.63 MIL/uL — ABNORMAL LOW (ref 4.40–5.90)
RDW: 12.6 % (ref 11.5–14.5)
WBC: 10 10*3/uL (ref 3.8–10.6)

## 2017-10-14 LAB — HEPARIN LEVEL (UNFRACTIONATED): HEPARIN UNFRACTIONATED: 0.64 [IU]/mL (ref 0.30–0.70)

## 2017-10-14 LAB — PTT-LA MIX: PTT-LA Mix: 54.3 s — ABNORMAL HIGH (ref 0.0–48.9)

## 2017-10-14 LAB — LUPUS ANTICOAGULANT PANEL
DRVVT: 36.8 s (ref 0.0–47.0)
PTT Lupus Anticoagulant: 60.6 s — ABNORMAL HIGH (ref 0.0–51.9)

## 2017-10-14 LAB — PROTEIN S ACTIVITY: Protein S Activity: 64 % (ref 63–140)

## 2017-10-14 LAB — HEXAGONAL PHASE PHOSPHOLIPID: Hexagonal Phase Phospholipid: 9 s (ref 0–11)

## 2017-10-14 LAB — PROTEIN C ACTIVITY: Protein C Activity: 93 % (ref 73–180)

## 2017-10-14 LAB — VANCOMYCIN, TROUGH: VANCOMYCIN TR: 17 ug/mL (ref 15–20)

## 2017-10-14 LAB — MAGNESIUM: MAGNESIUM: 2 mg/dL (ref 1.7–2.4)

## 2017-10-14 LAB — PROTEIN S, TOTAL: Protein S Ag, Total: 79 % (ref 60–150)

## 2017-10-14 MED ORDER — GABAPENTIN 300 MG PO CAPS
300.0000 mg | ORAL_CAPSULE | Freq: Every day | ORAL | Status: DC
  Administered 2017-10-14 – 2017-10-15 (×2): 300 mg via ORAL
  Filled 2017-10-14 (×2): qty 1

## 2017-10-14 NOTE — Progress Notes (Signed)
[facility] NAME: Edgar [name]    MR#:  [id number]  DATE OF BIRTH:  06-28-1990  SUBJECTIVE:  CHIEF COMPLAINT:  No chief complaint on file.  -s/p left leg venogram, angiogram, thrombectomy and venous stent placement. S/p IVC filter as well - left leg swelling is improving, superficial blister wounds present - remains on IV heparin, vanc and cefepime  REVIEW OF SYSTEMS:  Review of Systems  Constitutional: Negative for chills, fever and malaise/fatigue.  HENT: Negative for congestion, ear discharge, hearing loss and nosebleeds.   Eyes: Negative for blurred vision and double vision.  Respiratory: Negative for cough, shortness of breath and wheezing.   Cardiovascular: Negative for chest pain and palpitations.  Gastrointestinal: Negative for abdominal pain, constipation, diarrhea, nausea and vomiting.  Genitourinary: Negative for dysuria and urgency.  Musculoskeletal: Positive for myalgias.  Neurological: Negative for dizziness, focal weakness, seizures, weakness and headaches.  Psychiatric/Behavioral: Negative for depression.    DRUG ALLERGIES:  No Known Allergies  VITALS:  Blood pressure 131/76, pulse 77, temperature 97.7 F (36.5 C), temperature source Oral, resp. rate 17, height 6\' 3"  (1.905 m), weight 121.6 kg (268 lb), SpO2 97 %.  PHYSICAL EXAMINATION:  Physical Exam   GENERAL:  27 y.o.-year-old patient lying in the bed with no acute distress.  EYES: Pupils equal, round, reactive to light and accommodation. No scleral icterus. Extraocular muscles intact.  HEENT: Head atraumatic, normocephalic. Oropharynx and nasopharynx clear.  NECK:  Supple, no jugular venous distention. No thyroid enlargement, no tenderness.  LUNGS: Normal breath sounds bilaterally, no wheezing, rales,rhonchi or crepitation. No use of accessory muscles of respiration.  CARDIOVASCULAR: S1, S2 normal. No murmurs, rubs, or gallops.  ABDOMEN: Soft,  nontender, nondistended. Bowel sounds present. No organomegaly or mass.  EXTREMITIES: Left leg below-knee Unna wrap is present.  Left thigh superficial bleeding ulcers.  Swelling is improved.  No cyanosis, or clubbing.  NEUROLOGIC: Cranial nerves II through XII are intact. Muscle strength 5/5 in all extremities. Sensation intact. Gait not checked.  PSYCHIATRIC: The patient is alert and oriented x 3.  SKIN: No obvious rash, lesion, or ulcer.    LABORATORY PANEL:   CBC Recent Labs  Lab 10/14/17 0359  WBC 10.0  HGB 8.2*  HCT 23.5*  PLT 646*   ------------------------------------------------------------------------------------------------------------------  Chemistries  Recent Labs  Lab 10/11/17 0104  10/14/17 0359  NA 134*   < > 141  K 3.4*   < > 3.5  CL 98   < > 105  CO2 27   < > 28  GLUCOSE 103*   < > 133*  BUN 10   < > 8  CREATININE 0.81   < > 0.70  CALCIUM 8.2*   < > 8.4*  AST 16  --   --   ALT 21  --   --   ALKPHOS 49  --   --   BILITOT 0.6  --   --    < > = values in this interval not displayed.   ------------------------------------------------------------------------------------------------------------------  Cardiac Enzymes No results for input(s): TROPONINI in the last 168 hours. ------------------------------------------------------------------------------------------------------------------  RADIOLOGY:  No results found.  EKG:   Orders placed or performed in visit on 02/27/07  . EKG 12-Lead    ASSESSMENT AND PLAN:   27 year old male with past medical history significant for metastatic testicular cancer, left leg DVT with worsening edema and blisters.  1.  Left lower extremity cellulitis with blisters-CT venogram consistent with  large pelvic and retroperitoneal chronic lymph nodes with compression on external iliac vein, also has left leg deep venous thrombosis. -On IV antibiotics with vancomycin and cefepime. -Appreciate vascular consult. - on  heparin drip -s/p angiogram, percutaneous transluminal angioplasty of left common femoral vein, left iliac vein and external iliac vein and thrombectomy and venous stent placement with some improvement noted.  2.  Left leg DVT-currently on heparin drip.  Awaiting callback from vascular to see if can be changed to oral anticoagulation. -Was taking Xarelto prior to admission.  Status post IVC filter placement  3.  Metastatic testicular cancer-  Appreciate oncology consult - will f/u with Dr. Mike Gip after discharge for chemo  4.  Tobacco use disorder-counseled on admission.  On nicotine patch  Mother updated at bedside    All the records are reviewed and case discussed with Care Management/Social Workerr. Management plans discussed with the patient, family and they are in agreement.  CODE STATUS: Full code  TOTAL TIME TAKING CARE OF THIS PATIENT: 38 minutes.   POSSIBLE D/C IN 2-3 DAYS, DEPENDING ON CLINICAL CONDITION.   Mikaili Flippin M.D on 10/14/2017 at 1:14 PM  Between 7am to 6pm - Pager - [phone]  After 6pm go to [email or web address] - password [email or web address] [facility]  Office  [phone]  CC: Primary care physician; Patient, No Pcp Per

## 2017-10-14 NOTE — Progress Notes (Signed)
Pharmacy Antibiotic Note  Edgar [name] is a 27 y.o. male admitted on 10/08/2017 with cellulitis.  Pharmacy has been consulted for Vancomycin and Cefepime dosing.  Assessment/Plan:  Day 7-  cefepime 2 g IV q12 hours.   Day 7- vancomycin 1500 mg iv q 8 hours with trough at goal. Will f/u plans for antibiotics and consider checking another level in the next few days if plan to continue vancomycin.   10/14/17 Vancomycin trough at 1500= 17 mcg/ml. Will continue current regimen. F/u renal fxn. Will check trough again in a couple of days.   Height: 6\' 3"  (190.5 cm) Weight: 268 lb (121.6 kg) IBW/kg (Calculated) : 84.5  Temp (24hrs), Avg:97.8 F (36.6 C), Min:97.7 F (36.5 C), Max:97.9 F (36.6 C)  Recent Labs  Lab 10/08/17 1611 10/08/17 1731 10/08/17 1949 10/09/17 0728 10/10/17 0015  10/11/17 0104 10/11/17 1450 10/12/17 0408 10/13/17 1624 10/14/17 0359 10/14/17 1500  WBC 12.1*  --   --  10.9* 12.5*  --  14.0*  --  10.7*  --  10.0  --   CREATININE 0.95  --   --  0.92  --   --  0.81  --   --  0.85 0.70  --   LATICACIDVEN  --  1.3 0.9  --   --   --   --   --   --   --   --   --   VANCOTROUGH  --   --   --   --   --    < >  --  14*  --   --   --  17   < > = values in this interval not displayed.    Estimated Creatinine Clearance: 191.4 mL/min (by C-G formula based on SCr of 0.7 mg/dL).    No Known Allergies   8/1  Vanc T= 17 mcg/ml  No change  Antimicrobials this admission: Vancomycin 7/26 >>  Cefepime 7/26 >>  Clindamycin 7/26 >> 7/29   Chinita Greenland PharmD Clinical Pharmacist 10/14/2017

## 2017-10-14 NOTE — Progress Notes (Signed)
[facility]  Daily Progress Note   Subjective  - 1 Day Post-Op  Leg much less painful and much less swollen.  His primary concern now is the sores  Objective Vitals:   10/13/17 1513 10/13/17 1544 10/13/17 2034 10/14/17 0420  BP: 126/82 127/81 111/75 131/76  Pulse: 73 70 88 77  Resp: 19 18 17 17   Temp: 98.1 F (36.7 C) 97.9 F (36.6 C) 97.7 F (36.5 C) 97.7 F (36.5 C)  TempSrc:  Oral Oral Oral  SpO2: 100% 96% 94% 97%  Weight:      Height:        Intake/Output Summary (Last 24 hours) at 10/14/2017 1832 Last data filed at 10/14/2017 1600 Gross per 24 hour  Intake 2172.5 ml  Output 3100 ml  Net -927.5 ml    PULM  Normal effort , no use of accessory muscles CV  No JVD, RRR Abd      No distended, nontender VASC  left leg is essentially the same size as the right leg.  The 6 blisters have now been debrided   Laboratory CBC    Component Value Date/Time   WBC 10.0 10/14/2017 0359   HGB 8.2 (L) 10/14/2017 0359   HCT 23.5 (L) 10/14/2017 0359   PLT 646 (H) 10/14/2017 0359    BMET    Component Value Date/Time   NA 141 10/14/2017 0359   K 3.5 10/14/2017 0359   CL 105 10/14/2017 0359   CO2 28 10/14/2017 0359   GLUCOSE 133 (H) 10/14/2017 0359   BUN 8 10/14/2017 0359   CREATININE 0.70 10/14/2017 0359   CALCIUM 8.4 (L) 10/14/2017 0359   GFRNONAA >60 10/14/2017 0359   GFRAA >60 10/14/2017 0359    Assessment/Planning: POD #1 s/p venous intervention with stenting of the iliac veins   Okay to transition back to Xarelto tomorrow continue Silvadene cream to the blisters on a twice daily basis.  Remove the Covan and begin wrapping his left leg with Ace wraps using a 4 inch wrap from the ankle to the knee and a 6 inch wrap for the thigh  Hortencia Pilar  10/14/2017, 6:32 PM

## 2017-10-14 NOTE — Progress Notes (Signed)
[facility] Hematology/Oncology Progress Note  Date of admission: 10/08/2017  Hospital day:  10/14/2017  Chief Complaint: Edgar [name] is a 27 y.o. male with recurrent testicular cancer and LLE DVT who was admitted through the medical oncology clinic with cellulitiis.  Subjective:  He is feeling better since thrombectomy and stenting.  Pain is now predominantly associated with blisters.  Social History: The patient is accompanied by his aunt today.  Allergies: No Known Allergies  Scheduled Medications: . docusate sodium  100 mg Oral BID  . DULoxetine  60 mg Oral Daily  . gabapentin  300 mg Oral QHS  . magnesium oxide  400 mg Oral Daily  . nicotine  21 mg Transdermal Daily  . oxyCODONE  10 mg Oral Q12H  . potassium chloride  20 mEq Oral BID  . silver sulfADIAZINE   Topical BID    Review of Systems: GENERAL:  Feels "better".  No fevers, sweats or weight loss. PERFORMANCE STATUS (ECOG):  2. HEENT:  No visual changes, runny nose, sore throat, mouth sores or tenderness. Lungs: No shortness of breath or cough.  No hemoptysis. Cardiac:  No chest pain, palpitations, orthopnea, or PND. GI:  No nausea, vomiting, diarrhea, constipation, melena or hematochezia. GU:  No urgency, frequency, dysuria, or hematuria. Musculoskeletal:  No back pain.  No joint pain.  No muscle tenderness. Extremities:  Left leg pain and swelling, not improved.  Skin:  Skin lesions more painful with resolution of pain in groin. Neuro:  No headache, numbness or weakness, balance or coordination issues. Endocrine:  No diabetes, thyroid issues, hot flashes or night sweats. Psych:  Mood and affect brighter.  No mood changes, depression or anxiety. Pain:  Skin lesions painful. Review of systems:  All other systems reviewed and found to be negative.   Physical Exam: Blood pressure 127/82, pulse 85, temperature 97.8 F (36.6 C), temperature source Oral, resp. rate 18, height 6' 3"  (1.905 m),  weight 268 lb (121.6 kg), SpO2 99 %.  GENERAL:  Well developed , well nourished gentleman lying flat in bed with left lower leg wrapped and upper leg bandaged. MENTAL STATUS:  Alert and oriented to person, place and time. HEAD:  Dark thinning hair.  Normocephalic, atraumatic, face symmetric, no Cushingoid features. EYES:  Brown eyes.  Pupils equal round and reactive to light and accomodation.  No conjunctivitis or scleral icterus. ENT:  Oropharynx clear without lesion.  Tongue normal. Mucous membranes moist.  RESPIRATORY:  Clear to auscultation without rales, wheezes or rhonchi. CARDIOVASCULAR:  Regular rate and rhythm without murmur, rub or gallop. ABDOMEN:  Soft, non-tender, with active bowel sounds, and no hepatosplenomegaly.  No masses. SKIN:  Skin lesions under gauze. EXTREMITIES: Left leg less edematous.  Left lower leg wrapped.  Left upper leg loosely wrapped with dressing. NEUROLOGICAL: Unremarkable. PSYCH:  Appropriate.    Results for orders placed or performed during the hospital encounter of 10/08/17 (from the past 48 hour(s))  Heparin level (unfractionated)     Status: None   Collection Time: 10/13/17  3:26 AM  Result Value Ref Range   Heparin Unfractionated 0.53 0.30 - 0.70 IU/mL    Comment: (NOTE) If heparin results are below expected values, and patient dosage has  been confirmed, suggest follow up testing of antithrombin III levels. Performed at [facility], [address], [place], [place] [address]   Basic metabolic panel     Status: Abnormal   Collection Time: 10/13/17  4:24 PM  Result Value Ref  Range   Sodium 140 135 - 145 mmol/L   Potassium 4.0 3.5 - 5.1 mmol/L   Chloride 104 98 - 111 mmol/L   CO2 28 22 - 32 mmol/L   Glucose, Bld 117 (H) 70 - 99 mg/dL   BUN 8 6 - 20 mg/dL   Creatinine, Ser 0.85 0.61 - 1.24 mg/dL   Calcium 8.9 8.9 - 10.3 mg/dL   GFR calc non Af Amer >60 >60 mL/min   GFR calc Af Amer >60 >60 mL/min    Comment: (NOTE) The eGFR has  been calculated using the CKD EPI equation. This calculation has not been validated in all clinical situations. eGFR's persistently <60 mL/min signify possible Chronic Kidney Disease.    Anion gap 8 5 - 15    Comment: Performed at [facility], [place], [place] [address]  Heparin level (unfractionated)     Status: None   Collection Time: 10/13/17  8:18 PM  Result Value Ref Range   Heparin Unfractionated 0.42 0.30 - 0.70 IU/mL    Comment: (NOTE) If heparin results are below expected values, and patient dosage has  been confirmed, suggest follow up testing of antithrombin III levels. Performed at [facility], [address] [place], [place] [address]   CBC     Status: Abnormal   Collection Time: 10/14/17  3:59 AM  Result Value Ref Range   WBC 10.0 3.8 - 10.6 K/uL   RBC 2.63 (L) 4.40 - 5.90 MIL/uL   Hemoglobin 8.2 (L) 13.0 - 18.0 g/dL   HCT 23.5 (L) 40.0 - 52.0 %   MCV 89.4 80.0 - 100.0 fL   MCH 31.3 26.0 - 34.0 pg   MCHC 35.0 32.0 - 36.0 g/dL   RDW 12.6 11.5 - 14.5 %   Platelets 646 (H) 150 - 440 K/uL    Comment: Performed at [facility], [address] [place], [place] [address]  Basic metabolic panel     Status: Abnormal   Collection Time: 10/14/17  3:59 AM  Result Value Ref Range   Sodium 141 135 - 145 mmol/L   Potassium 3.5 3.5 - 5.1 mmol/L   Chloride 105 98 - 111 mmol/L   CO2 28 22 - 32 mmol/L   Glucose, Bld 133 (H) 70 - 99 mg/dL   BUN 8 6 - 20 mg/dL   Creatinine, Ser 0.70 0.61 - 1.24 mg/dL   Calcium 8.4 (L) 8.9 - 10.3 mg/dL   GFR calc non Af Amer >60 >60 mL/min   GFR calc Af Amer >60 >60 mL/min    Comment: (NOTE) The eGFR has been calculated using the CKD EPI equation. This calculation has not been validated in all clinical situations. eGFR's persistently <60 mL/min signify possible Chronic Kidney Disease.    Anion gap 8 5 - 15    Comment: Performed at [facility], [place], [place]  [address]  Heparin level (unfractionated)     Status: None   Collection Time: 10/14/17  3:59 AM  Result Value Ref Range   Heparin Unfractionated 0.64 0.30 - 0.70 IU/mL    Comment: (NOTE) If heparin results are below expected values, and patient dosage has  been confirmed, suggest follow up testing of antithrombin III levels. Performed at [facility], [address], [place], [place] [address]   Magnesium     Status: None   Collection Time: 10/14/17  3:59 AM  Result Value Ref Range   Magnesium 2.0 1.7 -  2.4 mg/dL    Comment: Performed at [facility], [address] [place], [place] [address]  Vancomycin, trough     Status: None   Collection Time: 10/14/17  3:00 PM  Result Value Ref Range   Vancomycin Tr 17 15 - 20 ug/mL    Comment: Performed at [facility], [address] [place], [place] [address]   No results found.  Assessment:  Edgar [name] is a 27 y.o. male with recurrent testicular cancer currently day 22 s/p cycle #1 TIP chemotherapy who was admitted with left lower extremity edema, progressive skin lesions, and cellulitis.  He was diagnosed with a left lower extremity DVT.  Bilateral lower extremity duplex on 09/10/2017 revealed evidence of obstruction in the left CFV, SFJ, and proximal femoral vein.  He was on Xarelto and is currently on heparin.  He was diagnosed with cellulitis of the left lower extremity on 10/06/2017.  He was on clindamycin as an outpatient.  He had persistent fevers and worsening left lower extremity symptoms.  He is currently on vancomycin and cefepime.  He is s/p IVC filter placement, percutaneous angioplasty and stent placement of the left common iliac vein and left external iliac vein.  Plan: 1.   Oncology:  Day 24 s/p cycle #1 TIP chemotherapy (last given at [facility] on 09/21/2017). Cycle #2 chemotherapy planned for 10/21/2017.    He has had significant improvement in adenopathy with decreased pelvic sidewall disease  and decreased bulk at aortic bifurcation following cycle #1.  2.  Hematology:  Normocytic anemia is stable.  Anemia work-up with normal B12, folate, ferritin (? elevated secondary to acute phase reactant).  Iron saturation low.    Patient on IV heparin with plans to likely switch back to Xarelto tomorrow.  Hypercoagulable work-up negative for the following:lupus anticoagulant, anticardiolipin antibodies, beta-2 glycoprotein, protein C activity/antigen, protein S activity/antigen.  ATIII 54% (75-120) on heparin.  Factor V Leiden and prothrombin gene mutation pending.    3.  Vascular:  Patient is POD 1 s/p thrombectomy and stent placement.  Clinically doing well.   Anticipate ongoing wound care in the outpatient department.  4.  Infectious disease:  Cellulitis improved with resolution of fever and skin erythema.  No positive cultures.  Continue broad spectrum antibiotics continue with plan to switch to oral antibiotics in the outpatient department to complete a 10 day course..  5.  Pain and toxicology:  Pain managed with morphine and oxycodone.   [name], MD  10/14/2017, 8:15 PM

## 2017-10-14 NOTE — Progress Notes (Signed)
Discussed with vascular team.  Plan to continue IV heparin for 1 more day and change to oral anticoagulation tomorrow

## 2017-10-14 NOTE — Progress Notes (Deleted)
Pharmacy Antibiotic Note  Edgar [name] is a 27 y.o. male admitted on 10/08/2017 with cellulitis.  Pharmacy has been consulted for Vancomycin and Cefepime dosing.  Assessment/Plan: 8/1 VT: 17. Will continue  vancomycin 1500 mg IV q 8 hours. Trough level is creeping up.  Will check another Vanc trough after 4 doses.  Will consider changing dose to 1250mg  every 8 hours if level increases.   Day 7-  cefepime 2 g IV q12 hours.    Height: 6\' 3"  (190.5 cm) Weight: 268 lb (121.6 kg) IBW/kg (Calculated) : 84.5  Temp (24hrs), Avg:97.8 F (36.6 C), Min:97.7 F (36.5 C), Max:97.9 F (36.6 C)  Recent Labs  Lab 10/08/17 1611 10/08/17 1731 10/08/17 1949 10/09/17 0728 10/10/17 0015  10/11/17 0104 10/11/17 1450 10/12/17 0408 10/13/17 1624 10/14/17 0359 10/14/17 1500  WBC 12.1*  --   --  10.9* 12.5*  --  14.0*  --  10.7*  --  10.0  --   CREATININE 0.95  --   --  0.92  --   --  0.81  --   --  0.85 0.70  --   LATICACIDVEN  --  1.3 0.9  --   --   --   --   --   --   --   --   --   VANCOTROUGH  --   --   --   --   --    < >  --  14*  --   --   --  17   < > = values in this interval not displayed.    Estimated Creatinine Clearance: 191.4 mL/min (by C-G formula based on SCr of 0.7 mg/dL).    No Known Allergies  Antimicrobials this admission: Vancomycin 7/26 >>  Cefepime 7/26 >>  Clindamycin 7/26 >> 7/29   Pernell Dupre, PharmD, BCPS Clinical Pharmacist 10/14/2017 3:36 PM

## 2017-10-14 NOTE — Progress Notes (Signed)
ANTICOAGULATION CONSULT NOTE  Pharmacy Consult for Heparin drip Indication: DVT  No Known Allergies  Patient Measurements: Height: 6\' 3"  (190.5 cm) Weight: 268 lb (121.6 kg) IBW/kg (Calculated) : 84.5 Heparin Dosing Weight: 110 kg  Vital Signs: Temp: 97.7 F (36.5 C) (08/01 0420) Temp Source: Oral (08/01 0420) BP: 131/76 (08/01 0420) Pulse Rate: 77 (08/01 0420)  Labs: Recent Labs    10/12/17 0408 10/13/17 0326 10/13/17 1624 10/13/17 2018 10/14/17 0359  HGB 9.1*  --   --   --  8.2*  HCT 26.2*  --   --   --  23.5*  PLT 448*  --   --   --  646*  HEPARINUNFRC 0.55 0.53  --  0.42 0.64  CREATININE  --   --  0.85  --   --     Estimated Creatinine Clearance: 180.1 mL/min (by C-G formula based on SCr of 0.85 mg/dL).   Medical History: Past Medical History:  Diagnosis Date  . Asthma    AS A CHILD-NO INHALERS  . Cancer ([place])    testicular cancer 11/2016 per pt   . GERD (gastroesophageal reflux disease)    TUMS PRN    Medications:  Patient was taking Xarelto prior to admission for DVT. Last dose was on 10/07/17 but note states that patient was unable to keep down.  Assessment: Patient is a 27yo male admitted for LE cellulitis. Pharmacy consulted for transition to Heparin infusion. Baseline Heparin level is elevated at 3.16 so will need to use aPTT to guide therapy  Goal of Therapy:  Heparin level 0.3-0.7 units/ml  Monitor platelets by anticoagulation protocol: Yes   Plan:  7/31 Heparin stopped for Left peripheral thrombectomy.  Infusion restarted at rate of 2750 units/hr at 1400. No Bolus. Will check HL in 6 hours.    7/31 HL @ 20:18 = 0.42 Will continue this pt on current rate and recheck HL on 8/1 with AM labs.   08/01 @ 0400 HL 0.64 therapeutic. Will continue current rate and will recheck w/ am labs.  Tobie Lords, PharmD Clinical Pharmacist 10/14/2017 4:35 AM

## 2017-10-14 NOTE — Plan of Care (Signed)

## 2017-10-14 NOTE — Progress Notes (Signed)
Pharmacy Antibiotic Note  Edgar [name] is a 27 y.o. male admitted on 10/08/2017 with cellulitis.  Pharmacy has been consulted for Vancomycin and Cefepime dosing.  Assessment/Plan:  Day 7-  cefepime 2 g IV q12 hours.   Day 7- vancomycin 1500 mg iv q 8 hours with trough at goal. Will f/u plans for antibiotics and consider checking another level in the next few days if plan to continue vancomycin.    Height: 6\' 3"  (190.5 cm) Weight: 268 lb (121.6 kg) IBW/kg (Calculated) : 84.5  Temp (24hrs), Avg:97.9 F (36.6 C), Min:97.5 F (36.4 C), Max:98.3 F (36.8 C)  Recent Labs  Lab 10/08/17 1611 10/08/17 1731 10/08/17 1949 10/09/17 0728 10/10/17 0015 10/10/17 1332 10/11/17 0104 10/11/17 1450 10/12/17 0408 10/13/17 1624 10/14/17 0359  WBC 12.1*  --   --  10.9* 12.5*  --  14.0*  --  10.7*  --  10.0  CREATININE 0.95  --   --  0.92  --   --  0.81  --   --  0.85 0.70  LATICACIDVEN  --  1.3 0.9  --   --   --   --   --   --   --   --   VANCOTROUGH  --   --   --   --   --  8*  --  14*  --   --   --     Estimated Creatinine Clearance: 191.4 mL/min (by C-G formula based on SCr of 0.7 mg/dL).    No Known Allergies  Antimicrobials this admission: Vancomycin 7/26 >>  Cefepime 7/26 >>  Clindamycin 7/26 >> 7/29   Chinita Greenland PharmD Clinical Pharmacist 10/14/2017

## 2017-10-15 ENCOUNTER — Other Ambulatory Visit: Payer: Self-pay | Admitting: Hematology and Oncology

## 2017-10-15 DIAGNOSIS — C6212 Malignant neoplasm of descended left testis: Secondary | ICD-10-CM

## 2017-10-15 LAB — CBC
HEMATOCRIT: 24.4 % — AB (ref 40.0–52.0)
HEMOGLOBIN: 8.1 g/dL — AB (ref 13.0–18.0)
MCH: 30.1 pg (ref 26.0–34.0)
MCHC: 33.1 g/dL (ref 32.0–36.0)
MCV: 91 fL (ref 80.0–100.0)
Platelets: 696 10*3/uL — ABNORMAL HIGH (ref 150–440)
RBC: 2.68 MIL/uL — ABNORMAL LOW (ref 4.40–5.90)
RDW: 13 % (ref 11.5–14.5)
WBC: 7.4 10*3/uL (ref 3.8–10.6)

## 2017-10-15 LAB — HEPARIN LEVEL (UNFRACTIONATED): Heparin Unfractionated: 0.7 IU/mL (ref 0.30–0.70)

## 2017-10-15 MED ORDER — RIVAROXABAN 20 MG PO TABS
20.0000 mg | ORAL_TABLET | Freq: Every day | ORAL | Status: DC
Start: 2017-10-15 — End: 2017-10-16
  Administered 2017-10-15: 17:00:00 20 mg via ORAL
  Filled 2017-10-15 (×3): qty 1

## 2017-10-15 NOTE — Progress Notes (Signed)
[facility]  Daily Progress Note   Subjective  - 2 Days Post-Op  Patient is much more comfortable and having much less pain.  On entering the room his leg is elevated nicely.  His dressing is intact.  Objective Vitals:   10/13/17 2034 10/14/17 0420 10/14/17 1929 10/15/17 0433  BP: 111/75 131/76 127/82 121/84  Pulse: 88 77 85 82  Resp: 17 17 18 17   Temp: 97.7 F (36.5 C) 97.7 F (36.5 C) 97.8 F (36.6 C) 98.1 F (36.7 C)  TempSrc: Oral Oral Oral Oral  SpO2: 94% 97% 99% 98%  Weight:      Height:        Intake/Output Summary (Last 24 hours) at 10/15/2017 1430 Last data filed at 10/15/2017 1002 Gross per 24 hour  Intake 600 ml  Output 2850 ml  Net -2250 ml    PULM  Normal effort , no use of accessory muscles CV  No JVD, RRR Abd      No distended, nontender VASC  left leg is now essentially the same size as the right leg.  Is soft and nontender to the touch.  The wounds appear clean.  Laboratory CBC    Component Value Date/Time   WBC 7.4 10/15/2017 0514   HGB 8.1 (L) 10/15/2017 0514   HCT 24.4 (L) 10/15/2017 0514   PLT 696 (H) 10/15/2017 0514    BMET    Component Value Date/Time   NA 141 10/14/2017 0359   K 3.5 10/14/2017 0359   CL 105 10/14/2017 0359   CO2 28 10/14/2017 0359   GLUCOSE 133 (H) 10/14/2017 0359   BUN 8 10/14/2017 0359   CREATININE 0.70 10/14/2017 0359   CALCIUM 8.4 (L) 10/14/2017 0359   GFRNONAA >60 10/14/2017 0359   GFRAA >60 10/14/2017 0359    Assessment/Planning: POD #2 s/p venous intervention left lower extremity  Patient has now been converted from IV heparin to Xarelto.  He is continue to elevate his leg.  He is wrapping his leg with Ace wraps for support.  His wounds are being changed daily.  Antibiotic ointment is being applied followed by gauze.  Okay for the patient to get into the shower when he gets home washes thigh gently with soap and water and then reapply Silvadene or bacitracin.    Hortencia Pilar  10/15/2017, 2:30 PM

## 2017-10-15 NOTE — Progress Notes (Signed)
Testicular - No Medical Intervention - Off Treatment.  Patient Characteristics: Nonseminoma, Relapsed (Post-chemotherapy), Relapse > 6 Weeks After Chemotherapy For Metastatic Disease, Multifocal Recurrence After Chemotherapy AJCC T Category: pT1 AJCC N Category: cN2 AJCC M Category: M0 AJCC S Category Post-Orchiectomy: S0 AJCC 8 Stage Grouping: IIB Current evidence of distant metastases<= Yes Histology: Nonseminoma

## 2017-10-15 NOTE — Progress Notes (Signed)
PT Cancellation Note  Patient Details Name: Edgar [name] MRN: [id number] DOB: [birth year]   Cancelled Treatment:    Reason Eval/Treat Not Completed: Other (comment).  Declined PT stating he is fine to walk alone.  Will recommend ck by tomorrow to ensure he is not having significant issues.   Ramond Dial 10/15/2017, 4:58 PM   Mee Hives, PT MS Acute Rehab Dept. Number: [place] and [place]

## 2017-10-15 NOTE — Consult Note (Signed)
WOC consult requested for cellulitis.  This was already performed on 7/29, refer to previous progress notes.  Vascular team performed a consult on 8/1 and ordered topical treatment with Silvadene and ace wraps for bedside nurses to perform.  No further role for [place].  Please re-consult if further assistance is needed.  Thank-you,  Julien Girt MSN, [place], [place], [place], [place]

## 2017-10-15 NOTE — Progress Notes (Signed)
Pharmacy Antibiotic Note  Edgar [name] is a 27 y.o. male admitted on 10/08/2017 with cellulitis.  Pharmacy has been consulted for Vancomycin and Cefepime dosing.  Assessment/Plan:  Day 8-  cefepime 2 g IV q12 hours.   Day 8- vancomycin 1500 mg iv q 8 hours with trough at goal. Will f/u plans for antibiotics and consider checking another level in the next few days if plan to continue vancomycin.   10/14/17 Vancomycin trough at 1500= 17 mcg/ml. Will continue current regimen.  F/u renal fxn. Will check trough again in a couple of days.   Height: 6\' 3"  (190.5 cm) Weight: 268 lb (121.6 kg) IBW/kg (Calculated) : 84.5  Temp (24hrs), Avg:98 F (36.7 C), Min:97.8 F (36.6 C), Max:98.1 F (36.7 C)  Recent Labs  Lab 10/08/17 1611 10/08/17 1731 10/08/17 1949 10/09/17 0728 10/10/17 0015  10/11/17 0104 10/11/17 1450 10/12/17 0408 10/13/17 1624 10/14/17 0359 10/14/17 1500 10/15/17 0514  WBC 12.1*  --   --  10.9* 12.5*  --  14.0*  --  10.7*  --  10.0  --  7.4  CREATININE 0.95  --   --  0.92  --   --  0.81  --   --  0.85 0.70  --   --   LATICACIDVEN  --  1.3 0.9  --   --   --   --   --   --   --   --   --   --   VANCOTROUGH  --   --   --   --   --    < >  --  14*  --   --   --  17  --    < > = values in this interval not displayed.    Estimated Creatinine Clearance: 191.4 mL/min (by C-G formula based on SCr of 0.7 mg/dL).    No Known Allergies   8/1  Vanc T= 17 mcg/ml  No change  Antimicrobials this admission: Vancomycin 7/26 >>  Cefepime 7/26 >>  Clindamycin 7/26 >> 7/29   Chinita Greenland PharmD Clinical Pharmacist 10/15/2017

## 2017-10-15 NOTE — Progress Notes (Signed)
DISCONTINUE ON PATHWAY REGIMEN - Testicular  No Medical Intervention - Off Treatment.  REASON: Disease Progression PRIOR TREATMENT: Testicular10: Referral to [facility] for High Dose Chemo with Peripheral Blood Stem Cell Transplant  START ON PATHWAY REGIMEN - Testicular     A cycle is every 21 days:     Paclitaxel      Ifosfamide      Cisplatin      [place]      Pegfilgrastim-xxxx   **Always confirm dose/schedule in your pharmacy ordering system**  Patient Characteristics: Nonseminoma, Relapsed (Post-chemotherapy), Relapse > 6 Weeks After Chemotherapy For Metastatic Disease, Multifocal Recurrence After Chemotherapy AJCC T Category: pT1 AJCC N Category: cN2 AJCC M Category: M0 AJCC S Category Post-Orchiectomy: S0 AJCC 8 Stage Grouping: IIB Current evidence of distant metastases<= Yes Histology: Nonseminoma  Intent of Therapy: Curative Intent, Discussed with Patient

## 2017-10-15 NOTE — Progress Notes (Signed)
[place] at [place] NAME: Edgar [name]    MR#:  [id number]  DATE OF BIRTH:  [birth year]  SUBJECTIVE:  CHIEF COMPLAINT:  No chief complaint on file.  -s/p left leg venogram, angiogram, thrombectomy and venous stent placement. S/p IVC filter as well - left leg swelling is improving, superficial blister wounds healing - up and moving slowly  REVIEW OF SYSTEMS:  Review of Systems  Constitutional: Negative for chills, fever and malaise/fatigue.  HENT: Negative for congestion, ear discharge, hearing loss and nosebleeds.   Eyes: Negative for blurred vision and double vision.  Respiratory: Negative for cough, shortness of breath and wheezing.   Cardiovascular: Negative for chest pain and palpitations.  Gastrointestinal: Negative for abdominal pain, constipation, diarrhea, nausea and vomiting.  Genitourinary: Negative for dysuria and urgency.  Musculoskeletal: Positive for myalgias.  Neurological: Negative for dizziness, focal weakness, seizures, weakness and headaches.  Psychiatric/Behavioral: Negative for depression.    DRUG ALLERGIES:  No Known Allergies  VITALS:  Blood pressure 121/84, pulse 82, temperature 98.1 F (36.7 C), temperature source Oral, resp. rate 17, height 6\' 3"  (1.905 m), weight 121.6 kg (268 lb), SpO2 98 %.  PHYSICAL EXAMINATION:  Physical Exam   GENERAL:  27 y.o.-year-old patient lying in the bed with no acute distress.  EYES: Pupils equal, round, reactive to light and accommodation. No scleral icterus. Extraocular muscles intact.  HEENT: Head atraumatic, normocephalic. Oropharynx and nasopharynx clear.  NECK:  Supple, no jugular venous distention. No thyroid enlargement, no tenderness.  LUNGS: Normal breath sounds bilaterally, no wheezing, rales,rhonchi or crepitation. No use of accessory muscles of respiration.  CARDIOVASCULAR: S1, S2 normal. No murmurs, rubs, or gallops.  ABDOMEN: Soft, nontender, nondistended.  Bowel sounds present. No organomegaly or mass.  EXTREMITIES: Left leg below-knee [place] wrap is present.  Left thigh superficial bleeding wounds.  Swelling is improved.  No cyanosis, or clubbing.  NEUROLOGIC: Cranial nerves II through XII are intact. Muscle strength 5/5 in all extremities. Sensation intact. Gait not checked.  PSYCHIATRIC: The patient is alert and oriented x 3.  SKIN: No obvious rash, lesion, or ulcer.    LABORATORY PANEL:   CBC Recent Labs  Lab 10/15/17 0514  WBC 7.4  HGB 8.1*  HCT 24.4*  PLT 696*   ------------------------------------------------------------------------------------------------------------------  Chemistries  Recent Labs  Lab 10/11/17 0104  10/14/17 0359  NA 134*   < > 141  K 3.4*   < > 3.5  CL 98   < > 105  CO2 27   < > 28  GLUCOSE 103*   < > 133*  BUN 10   < > 8  CREATININE 0.81   < > 0.70  CALCIUM 8.2*   < > 8.4*  MG  --   --  2.0  AST 16  --   --   ALT 21  --   --   ALKPHOS 49  --   --   BILITOT 0.6  --   --    < > = values in this interval not displayed.   ------------------------------------------------------------------------------------------------------------------  Cardiac Enzymes No results for input(s): TROPONINI in the last 168 hours. ------------------------------------------------------------------------------------------------------------------  RADIOLOGY:  No results found.  EKG:   Orders placed or performed in visit on 02/27/07  . EKG 12-Lead    ASSESSMENT AND PLAN:   27 year old male with past medical history significant for metastatic testicular cancer, left leg DVT with worsening edema and blisters.  1.  Left lower extremity  cellulitis with blisters-CT venogram consistent with large pelvic and retroperitoneal chronic lymph nodes with compression on external iliac vein, also has left leg deep venous thrombosis. -On IV antibiotics with vancomycin and cefepime. Will change to orals tomorrow -Appreciate  vascular consult. - on heparin drip- change to oral xarelto today -s/p angiogram, percutaneous transluminal angioplasty of left common femoral vein, left iliac vein and external iliac vein and thrombectomy and venous stent placement with some improvement noted.  2.  Left leg DVT- on heparin drip- change to oral xarelto today as no further procedures scheduled -Status post IVC filter placement  3.  Metastatic testicular cancer-  Appreciate oncology consult - will f/u with Dr. Mike Gip after discharge for chemo - likely scheduled for next week  4.  Tobacco use disorder-counseled on admission.  On nicotine patch  Encourage ambulation    All the records are reviewed and case discussed with Care Management/Social Workerr. Management plans discussed with the patient, family and they are in agreement.  CODE STATUS: Full code  TOTAL TIME TAKING CARE OF THIS PATIENT: 38 minutes.   POSSIBLE D/C IN 1-2  DAYS, DEPENDING ON CLINICAL CONDITION.   Gladstone Lighter M.D on 10/15/2017 at 12:08 PM  Between 7am to 6pm - Pager - [phone]  After 6pm go to [email or web address] - password [email or web address]  Office  [phone]  CC: Primary care physician; Patient, No Pcp Per

## 2017-10-15 NOTE — Progress Notes (Signed)
ANTICOAGULATION CONSULT NOTE  Pharmacy Consult for Heparin drip Indication: DVT  No Known Allergies  Patient Measurements: Height: 6\' 3"  (190.5 cm) Weight: 268 lb (121.6 kg) IBW/kg (Calculated) : 84.5 Heparin Dosing Weight: 110 kg  Vital Signs: Temp: 98.1 F (36.7 C) (08/02 0433) Temp Source: Oral (08/02 0433) BP: 121/84 (08/02 0433) Pulse Rate: 82 (08/02 0433)  Labs: Recent Labs    10/13/17 1624 10/13/17 2018 10/14/17 0359 10/15/17 0514  HGB  --   --  8.2* 8.1*  HCT  --   --  23.5* 24.4*  PLT  --   --  646* 696*  HEPARINUNFRC  --  0.42 0.64 0.70  CREATININE 0.85  --  0.70  --     Estimated Creatinine Clearance: 191.4 mL/min (by C-G formula based on SCr of 0.7 mg/dL).   Medical History: Past Medical History:  Diagnosis Date  . Asthma    AS A CHILD-NO INHALERS  . Cancer ([place])    testicular cancer 11/2016 per pt   . GERD (gastroesophageal reflux disease)    TUMS PRN    Medications:  Patient was taking Xarelto prior to admission for DVT. Last dose was on 10/07/17 but note states that patient was unable to keep down.  Assessment: Patient is a 27yo male admitted for LE cellulitis. Pharmacy consulted for transition to Heparin infusion. Baseline Heparin level is elevated at 3.16 so will need to use aPTT to guide therapy  Goal of Therapy:  Heparin level 0.3-0.7 units/ml  Monitor platelets by anticoagulation protocol: Yes   Plan:  7/31 Heparin stopped for Left peripheral thrombectomy.  Infusion restarted at rate of 2750 units/hr at 1400. No Bolus. Will check HL in 6 hours.   continue current rate and will recheck w/ am labs.  08/02 @ 0500 HL 0.70 therapeutic, but borderline supratherapeutic. Will decrease rate to 2650 units/hr and will recheck next anti-Xa @ 1200. CBC stable.  Tobie Lords, PharmD Clinical Pharmacist 10/15/2017 6:57 AM

## 2017-10-16 DIAGNOSIS — R52 Pain, unspecified: Secondary | ICD-10-CM

## 2017-10-16 LAB — BASIC METABOLIC PANEL
Anion gap: 8 (ref 5–15)
BUN: 10 mg/dL (ref 6–20)
CALCIUM: 8.9 mg/dL (ref 8.9–10.3)
CHLORIDE: 104 mmol/L (ref 98–111)
CO2: 29 mmol/L (ref 22–32)
Creatinine, Ser: 0.75 mg/dL (ref 0.61–1.24)
GFR calc Af Amer: >60 mL/min (ref >60)
GFR calc non Af Amer: >60 mL/min (ref >60)
GLUCOSE: 97 mg/dL (ref 70–99)
Potassium: 3.9 mmol/L (ref 3.5–5.1)
Sodium: 141 mmol/L (ref 135–145)

## 2017-10-16 MED ORDER — DOXYCYCLINE HYCLATE 100 MG PO TABS: 100.0000 mg | ORAL_TABLET | Freq: Two times a day (BID) | ORAL | 0 refills | Status: DC

## 2017-10-16 MED ORDER — POLYETHYLENE GLYCOL 3350 17 G PO PACK: 17.0000 g | PACK | Freq: Every day | ORAL | 0 refills | Status: AC | PRN

## 2017-10-16 MED ORDER — HEPARIN SOD (PORK) LOCK FLUSH 100 UNIT/ML IV SOLN
500.0000 [IU] | INTRAVENOUS | Status: DC | PRN
  Filled 2017-10-16: qty 5

## 2017-10-16 MED ORDER — METRONIDAZOLE 500 MG PO TABS: 500.0000 mg | ORAL_TABLET | Freq: Three times a day (TID) | ORAL | 0 refills | Status: DC

## 2017-10-16 MED ORDER — OXYCODONE HCL 10 MG PO TABS: 10.0000 mg | ORAL_TABLET | Freq: Four times a day (QID) | ORAL | 0 refills | Status: DC | PRN

## 2017-10-16 MED ORDER — SODIUM CHLORIDE 0.9% FLUSH
10.0000 mL | INTRAVENOUS | Status: DC | PRN
Start: 2017-10-16 — End: 2017-10-16
  Administered 2017-10-16 (×2): 10 mL via INTRAVENOUS
  Filled 2017-10-16: qty 10

## 2017-10-16 MED ORDER — SODIUM CHLORIDE 0.9% FLUSH: 10.0000 mL | INTRAVENOUS | Status: DC | PRN

## 2017-10-16 MED ORDER — GABAPENTIN 300 MG PO CAPS: 300.0000 mg | ORAL_CAPSULE | Freq: Every day | ORAL | 0 refills | Status: DC

## 2017-10-16 MED ORDER — OXYCODONE HCL ER 10 MG PO T12A: 10.0000 mg | EXTENDED_RELEASE_TABLET | Freq: Two times a day (BID) | ORAL | 0 refills | Status: DC

## 2017-10-16 MED ORDER — LEVOFLOXACIN 500 MG PO TABS: 500.0000 mg | ORAL_TABLET | Freq: Every day | ORAL | 0 refills | Status: DC

## 2017-10-16 MED ORDER — SILVER SULFADIAZINE 1 % EX CREA: TOPICAL_CREAM | Freq: Two times a day (BID) | CUTANEOUS | 0 refills | Status: DC

## 2017-10-16 NOTE — Progress Notes (Signed)
Dr. Mike Gip paged per MD request to see wounds- no response. Pt, wife and aunt instructed on wound care/dsg change with left leg wounds cleanse with saline, thin layer of silvadene applied, covered with xeroform with telfa pad applied on top. 4 inch ace wrapped from ankle to knee and then 6 inch ace wrapped from knee to upper left thigh. Pt given sufficient supplies to do dsg change tonight and some for morning. Oral and written AVS instructions given with written prescription for oxycodone and oxycodontin given. Ready for discharge home to self/family care with [facility] referral. Discharge home- with transport to private vehicle in transport chair.

## 2017-10-16 NOTE — Discharge Instructions (Signed)
Bleeding Precautions When on Anticoagulant Therapy  WHAT IS ANTICOAGULANT THERAPY?  Anticoagulant therapy is taking medicine to prevent or reduce blood clots. It is also called blood thinner therapy. Blood clots that form in your blood vessels can be dangerous. They can break loose and travel to your heart, lungs, or brain. This increases your risk of a heart attack or stroke. Anticoagulant therapy causes blood to clot more slowly.  You may need anticoagulant therapy if you have:   A medical condition that increases the likelihood that blood clots will form.   A heart defect or a problem with heart rhythm.  It is also a common treatment after heart surgery, such as valve replacement.  WHAT ARE COMMON TYPES OF ANTICOAGULANT THERAPY?  Anticoagulant medicine can be injected or taken by mouth.If you need anticoagulant therapy quickly at the hospital, the medicine may be injected under your skin or given through an IV tube. Heparin is a common example of an anticoagulant that you may get at the hospital.  Most anticoagulant therapy is in the form of pills that you take at home every day. These may include:   Aspirin. This common blood thinner works by preventing blood cells (platelets) from sticking together to form a clot. Aspirin is not as strong as anticoagulants that slow down the time that it takes for your body to form a clot.   Clopidogrel. This is a newer type of drug that affects platelets. It is stronger than aspirin.   Warfarin. This is the most common anticoagulant. It changes the way your body uses vitamin K, a vitamin that helps your blood to clot. The risk of bleeding is higher with warfarin than with aspirin. You will need frequent blood tests to make sure you are taking the safest amount.   New anticoagulants. Several new drugs have been approved. They are all taken by mouth. Studies show that these drugs work as well as warfarin. They do not require blood testing. They may cause less bleeding  risk than warfarin.  WHAT DO I NEED TO REMEMBER WHEN TAKING ANTICOAGULANT THERAPY?  Anticoagulant therapy decreases your risk of forming a blood clot, but it increases your risk of bleeding. Work closely with your health care provider to make sure you are taking your medicine safely. These tips can help:   Learn ways to reduce your risk of bleeding.   If you are taking warfarin:  ? Have blood tests as ordered by your health care provider.  ? Do not make any sudden changes to your diet. Vitamin K in your diet can make warfarin less effective.  ? Do not get pregnant. This medicine may cause birth defects.   Take your medicine at the same time every day. If you forget to take your medicine, take it as soon as you remember. If you miss a whole day, do not double your dose of medicine. Take your normal dose and call your health care provider to check in.   Do not stop taking your medicine on your own.   Tell your health care provider before you start taking any new medicine, vitamin, or herbal product. Some of these could interfere with your therapy.   Tell all of your health care providers that you are on anticoagulant therapy.   Do not have surgery, medical procedures, or dental work until you tell your health care provider that you are on anticoagulant therapy.  WHAT CAN AFFECT HOW ANTICOAGULANTS WORK?  Certain foods, vitamins, medicines, supplements, and herbal   medicines change the way that anticoagulant therapy works. They may increase or decrease the effects of your anticoagulant therapy. Either result can be dangerous for you.   Many over-the-counter medicines for pain, colds, or stomach problems interfere with anticoagulant therapy. Take these only as told by your health care provider.   Do not drink alcohol. It can interfere with your medicine and increase your risk of an injury that causes bleeding.   If you are taking warfarin, do not begin eating more foods that contain vitamin K. These include  leafy green vegetables. Ask your health care provider if you should avoid any foods.  WHAT ARE SOME WAYS TO PREVENT BLEEDING?  You can prevent bleeding by taking certain precautions:   Be extra careful when you use knives, scissors, or other sharp objects.   Use an electric razor instead of a blade.   Do not use toothpicks.   Use a soft toothbrush.   Wear shoes that have nonskid soles.   Use bath mats and handrails in your bathroom.   Wear gloves while you do yard work.   Wear a helmet when you ride a bike.   Wear your seat belt.   Prevent falls by removing loose rugs and extension cords from areas where you walk.   Do not play contact sports or participate in other activities that have a high risk of injury.  WHEN SHOULD I CONTACT MY HEALTH CARE PROVIDER?  Call your health care provider if:   You miss a dose of medicine:  ? And you are not sure what to do.  ? For more than one day.   You have:  ? Menstrual bleeding that is heavier than normal.  ? Blood in your urine.  ? A bloody nose or bleeding gums.  ? Easy bruising.  ? Blood in your stool (feces) or have black and tarry stool.  ? Side effects from your medicine.   You feel weak or dizzy.   You become pregnant.  Seek immediate medical care if:   You have bleeding that will not stop.   You have sudden and severe headache or belly pain.   You vomit or you cough up bright red blood.   You have a severe blow to your head.  WHAT ARE SOME QUESTIONS TO ASK MY HEALTH CARE PROVIDER?   What is the best anticoagulant therapy for my condition?   What side effects should I watch for?   When should I take my medicine? What should I do if I forget to take it?   Will I need to have regular blood tests?   Do I need to change my diet? Are there foods or drinks that I should avoid?   What activities are safe for me?   What should I do if I want to get pregnant?  This information is not intended to replace advice given to you by your health care provider.  Make sure you discuss any questions you have with your health care provider.  Document Released: 02/11/2015 Document Reviewed: 02/11/2015  Elsevier Interactive Patient Education  2017 [employer]

## 2017-10-16 NOTE — Care Management Note (Signed)
Case Management Note  Patient Details  Name: BAYANI RENTERIA MRN: [id number] Date of Birth: 11-07-1990  Subjective/Objective:   Patient to be discharged per MD order. Orders in place for home health services. Given choice patient would like to use [facility]. Referral placed with [place] who is agree able to accept patient for RN services. Patient requires no DME. Spouse in the room to provide transport.  Ines Bloomer RN BSN RNCM [phone]                Action/Plan:   Expected Discharge Date:  10/16/17               Expected Discharge Plan:  [place]  In-House Referral:     Discharge planning Services  CM Consult  Post Acute Care Choice:  Home Health Choice offered to:  Patient  DME Arranged:    DME Agency:     HH Arranged:  RN [place] Agency:  [facility]  Status of Service:  Completed, signed off  If discussed at [place] of Stay Meetings, dates discussed:    Additional Comments:  Advith Martine A Kamaal Cast, RN 10/16/2017, 2:00 PM

## 2017-10-16 NOTE — Progress Notes (Signed)
[facility] Hematology/Oncology Progress Note  Date of admission: 10/08/2017  Hospital day:  10/16/2017  Chief Complaint: Edgar [name] is a 27 y.o. male with recurrent testicular cancer and LLE DVT who was admitted through the medical oncology clinic with cellulitiis.  Subjective:  He fells good today and ready for discharge.  Swelling has improved.  Pain is well controlled.  Social History: The patient is accompanied by his wife and daughter today.  Allergies: No Known Allergies  Scheduled Medications: . docusate sodium  100 mg Oral BID  . DULoxetine  60 mg Oral Daily  . gabapentin  300 mg Oral QHS  . magnesium oxide  400 mg Oral Daily  . nicotine  21 mg Transdermal Daily  . oxyCODONE  10 mg Oral Q12H  . potassium chloride  20 mEq Oral BID  . rivaroxaban  20 mg Oral Daily  . silver sulfADIAZINE   Topical BID    Review of Systems: GENERAL:  Feels "pretty good".  No fevers, sweats or weight loss. PERFORMANCE STATUS (ECOG):  1-2 HEENT:  No visual changes, runny nose, sore throat, mouth sores or tenderness. Lungs: No shortness of breath or cough.  No hemoptysis. Cardiac:  No chest pain, palpitations, orthopnea, or PND. GI:  No nausea, vomiting, diarrhea, constipation, melena or hematochezia. GU:  No urgency, frequency, dysuria, or hematuria. Musculoskeletal:  Left leg continues to improve.  No muscle tenderness. Extremities:  Left leg swelling, improved. Skin:  Skin lesions unchanged per patient. Neuro:  No headache, numbness or weakness, balance or coordination issues. Endocrine:  No diabetes, thyroid issues, hot flashes or night sweats. Psych:  Getting "stir crazy" as wants to go home.  No mood changes, depression or anxiety. Pain:  Pain well controlled. Review of systems:  All other systems reviewed and found to be negative.   Physical Exam: Blood pressure 123/79, pulse 66, temperature 97.8 F (36.6 C), temperature source Oral, resp. rate 19, height  _0  (1.905 m), weight 268 lb (121.6 kg), SpO2 96 %.  GENERAL:  Well developed, well nourished, gentleman sitting up in bed in no acute distress. MENTAL STATUS:  Alert and oriented to person, place and time. HEAD:  Dark thinning hair.  Normocephalic, atraumatic, face symmetric, no Cushingoid features. EYES: Brown eyes.  Pupils equal round and reactive to light and accomodation.  No conjunctivitis or scleral icterus. ENT:  Oropharynx clear without lesion.  Tongue normal. Mucous membranes moist.  RESPIRATORY:  Clear to auscultation without rales, wheezes or rhonchi. CARDIOVASCULAR:  Regular rate and rhythm without murmur, rub or gallop. ABDOMEN:  Soft, non-tender, with active bowel sounds, and no hepatosplenomegaly.  No masses. SKIN:  No rashes, ulcers or lesions. EXTREMITIES: Upper and lower left leg wrapped in Ace bandage.  Decreased edema.   NEUROLOGICAL: Unremarkable. PSYCH:  Appropriate.    Results for orders placed or performed during the hospital encounter of 10/08/17 (from the past 48 hour(s))  Vancomycin, trough     Status: None   Collection Time: 10/14/17  3:00 PM  Result Value Ref Range   Vancomycin Tr 17 15 - 20 ug/mL    Comment: Performed at [facility], [place], [place] [address]  Heparin level (unfractionated)     Status: None   Collection Time: 10/15/17  5:14 AM  Result Value Ref Range   Heparin Unfractionated 0.70 0.30 - 0.70 IU/mL    Comment: (NOTE) If heparin results are below expected values, and patient dosage has  been confirmed,  suggest follow up testing of antithrombin III levels. Performed at [facility], [address] [place], [place] [address]   CBC     Status: Abnormal   Collection Time: 10/15/17  5:14 AM  Result Value Ref Range   WBC 7.4 3.8 - 10.6 K/uL   RBC 2.68 (L) 4.40 - 5.90 MIL/uL   Hemoglobin 8.1 (L) 13.0 - 18.0 g/dL   HCT 24.4 (L) 40.0 - 52.0 %   MCV 91.0 80.0 - 100.0 fL   MCH 30.1 26.0 - 34.0 pg    MCHC 33.1 32.0 - 36.0 g/dL   RDW 13.0 11.5 - 14.5 %   Platelets 696 (H) 150 - 440 K/uL    Comment: Performed at [facility], [address] [place], [place] [address]  Basic metabolic panel     Status: None   Collection Time: 10/16/17  4:40 AM  Result Value Ref Range   Sodium 141 135 - 145 mmol/L   Potassium 3.9 3.5 - 5.1 mmol/L   Chloride 104 98 - 111 mmol/L   CO2 29 22 - 32 mmol/L   Glucose, Bld 97 70 - 99 mg/dL   BUN 10 6 - 20 mg/dL   Creatinine, Ser 0.75 0.61 - 1.24 mg/dL   Calcium 8.9 8.9 - 10.3 mg/dL   GFR calc non Af Amer >60 >60 mL/min   GFR calc Af Amer >60 >60 mL/min    Comment: (NOTE) The eGFR has been calculated using the CKD EPI equation. This calculation has not been validated in all clinical situations. eGFR's persistently <60 mL/min signify possible Chronic Kidney Disease.    Anion gap 8 5 - 15    Comment: Performed at [facility], [address] [place], [place] [address]   No results found.  Assessment:  Edgar [name] is a 27 y.o. male with recurrent testicular cancer currently day 22 s/p cycle #1 TIP chemotherapy who was admitted with left lower extremity edema, progressive skin lesions, and cellulitis.  He was diagnosed with a left lower extremity DVT.  Bilateral lower extremity duplex on 09/10/2017 revealed evidence of obstruction in the left CFV, SFJ, and proximal femoral vein.  He was on Xarelto and is currently on heparin.  He was diagnosed with cellulitis of the left lower extremity on 10/06/2017.  He was on clindamycin as an outpatient.  He had persistent fevers and worsening left lower extremity symptoms.  He is currently on vancomycin and cefepime.  He is s/p IVC filter placement, percutaneous angioplasty and stent placement of the left common iliac vein and left external iliac vein on 10/13/2017.  Plan: 1.   Oncology:  Day 10 s/p cycle #1 TIP chemotherapy (last given at [facility] on 09/21/2017).  Cycle #2 chemotherapy planned for  10/21/2017.  Discussed plan for evaluation in clinic then 5 day admission.  Discuss plan for hearing test secondary to ongoing cisplatin prior to cycle #3.  He has had significant improvement in adenopathy with decreased pelvic sidewall disease and decreased bulk at aortic bifurcation following cycle #1.  2.  Hematology:  Normocytic anemia is stable.  Anemia work-up with normal B12, folate, ferritin (? elevated secondary to acute phase reactant).  Iron saturation is low.  Consider trial of oral or IV iron in outpatient department.  No need for transfusion.    Patient switched from IV heparin to Xarelto yesterday.  Hypercoagulable work-up negative for the following: lupus anticoagulant, anticardiolipin antibodies, beta-2 glycoprotein, protein C activity/antigen, protein S activity/antigen.  ATIII  54% (75-120) on heparin.  Factor V Leiden and prothrombin gene mutation pending.    3.  Vascular:  Patient is POD 3 s/p thrombectomy and stent placement.  Clinically doing well.   Anticipate home health and wound care in the outpatient department.  Silvadene cream topically BID.  4.  Infectious disease:  Cellulitis improved with resolution of fever and skin erythema.  No positive cultures.  Plan to complete 10 days of antibiotics (today day 9).  5.  Pain and toxicology:  Pain managed with morphine and oxycodone.  Anticipate oxycodone prn in outpatient department.  6.  Disposition:  Anticipate discharge home today.  Follow-up in clinic on 10/21/2017 for evaluation prior to inpatient admission.   [name], MD  10/16/2017, 12:00 PM

## 2017-10-16 NOTE — Discharge Summary (Signed)
[facility] [place] NAME: Edgar [name]    MR#:  [id number]  DATE OF BIRTH:  1991-01-28  DATE OF ADMISSION:  10/08/2017   ADMITTING PHYSICIAN: Vaughan Basta, MD  DATE OF DISCHARGE:  10/16/17  PRIMARY CARE PHYSICIAN: Patient, No Pcp Per   ADMISSION DIAGNOSIS:   cellulitus DVT  DISCHARGE DIAGNOSIS:   Principal Problem:   Cellulitis Active Problems:   Normocytic anemia   DVT (deep venous thrombosis) (HCC)   History of testicular cancer   Pain   SECONDARY DIAGNOSIS:   Past Medical History:  Diagnosis Date  . Asthma    AS A CHILD-NO INHALERS  . Cancer ([facility] [facility])    testicular cancer 11/2016 per pt   . GERD (gastroesophageal reflux disease)    TUMS PRN    HOSPITAL COURSE:   27 year old male with past medical history significant for metastatic testicular cancer, left leg DVT with worsening edema and blisters.  1.  Left lower extremity cellulitis with blisters-CT venogram consistent with large pelvic and retroperitoneal chronic lymph nodes with compression on external iliac vein, also has left leg deep venous thrombosis. -received  IV antibiotics with vancomycin and cefepime. Will change to flagyl, doxycyline and levaquin at discharge-for broader coverage -Appreciate vascular consult. -Dressing to the superficial wounds with Silvadene ointment twice a day and nonadherent dressing with Ace wrap for comfort. -was on heparin drip in the hospital and then changed to oral Xarelto. -s/p angiogram, percutaneous transluminal angioplasty of left common femoral vein, left iliac vein and external iliac vein and thrombectomy and venous stent placement with significant improvement in the swelling and pain noted.  2.  Left leg DVT-was on heparin drip, changed back to Xarelto now -Status post IVC filter placement -Keep the leg elevated and Ace wrap for comfort  3.  Metastatic testicular cancer-  Appreciate oncology consult - will f/u  with Dr. Mike Gip after discharge for chemo - likely scheduled for next week  4.  Tobacco use disorder-counseled on admission.  On nicotine patch  Encourage ambulation Patient refused physical therapy.  as he has been able to ambulate well.    DISCHARGE CONDITIONS:   Guarded  CONSULTS OBTAINED:   Treatment Team:  Evaristo Bury, MD  DRUG ALLERGIES:   No Known Allergies DISCHARGE MEDICATIONS:   Allergies as of 10/16/2017   No Known Allergies     Medication List    STOP taking these medications   busPIRone 10 MG tablet Commonly known as:  BUSPAR   clindamycin 300 MG capsule Commonly known as:  CLEOCIN   dexamethasone 4 MG tablet Commonly known as:  DECADRON   HYDROcodone-acetaminophen 5-325 MG tablet Commonly known as:  NORCO/VICODIN   sertraline 50 MG tablet Commonly known as:  ZOLOFT     TAKE these medications   acetaminophen 500 MG tablet Commonly known as:  TYLENOL Take 500-1,000 mg by mouth every 6 (six) hours as needed for mild pain or fever.   CYMBALTA 60 MG capsule Generic drug:  DULoxetine Take 1 tablet by mouth daily.   doxycycline 100 MG tablet Commonly known as:  VIBRA-TABS Take 1 tablet (100 mg total) by mouth 2 (two) times daily for 10 days.   gabapentin 300 MG capsule Commonly known as:  NEURONTIN Take 1 capsule (300 mg total) by mouth at bedtime.   levofloxacin 500 MG tablet Commonly known as:  LEVAQUIN Take 1 tablet (500 mg total) by mouth daily for 10 days. What changed:  See  the new instructions.   magnesium oxide 400 MG tablet Commonly known as:  MAG-OX Take 1 tablet by mouth daily.   metroNIDAZOLE 500 MG tablet Commonly known as:  FLAGYL Take 1 tablet (500 mg total) by mouth 3 (three) times daily for 10 days.   nicotine 7 mg/24hr patch Commonly known as:  NICODERM CQ - dosed in mg/24 hr Place onto the skin daily.   ondansetron 4 MG disintegrating tablet Commonly known as:  ZOFRAN-ODT Take 2 tablets by mouth every 8  (eight) hours as needed.   oxyCODONE 10 mg 12 hr tablet Commonly known as:  OXYCONTIN Take 1 tablet (10 mg total) by mouth every 12 (twelve) hours for 7 days. What changed:  You were already taking a medication with the same name, and this prescription was added. Make sure you understand how and when to take each.   Oxycodone HCl 10 MG Tabs Take 1 tablet (10 mg total) by mouth every 6 (six) hours as needed (moderate and severe pain). What changed:    when to take this  reasons to take this   polyethylene glycol packet Commonly known as:  MIRALAX / GLYCOLAX Take 17 g by mouth daily as needed for mild constipation.   potassium chloride 20 MEQ packet Commonly known as:  KLOR-CON Take 20 mEq by mouth 2 (two) times daily.   prochlorperazine 10 MG tablet Commonly known as:  COMPAZINE Take 1 tablet by mouth every 6 (six) hours as needed (nausea/vomiting).   silver sulfADIAZINE 1 % cream Commonly known as:  SILVADENE Apply topically 2 (two) times daily. Apply to blisters on left thigh and cover with nonadherent dressing   XARELTO 20 MG Tabs tablet Generic drug:  rivaroxaban Take 1 tablet by mouth daily.        DISCHARGE INSTRUCTIONS:   1.  Oncology follow-up in 5 days 2.  Home health nursing 3.  Vascular follow-up in 1 month  DIET:   Regular diet  ACTIVITY:   Activity as tolerated  OXYGEN:   Home Oxygen: No.  Oxygen Delivery: room air  DISCHARGE LOCATION:   home   If you experience worsening of your admission symptoms, develop shortness of breath, life threatening emergency, suicidal or homicidal thoughts you must seek medical attention immediately by calling 911 or calling your MD immediately  if symptoms less severe.  You Must read complete instructions/literature along with all the possible adverse reactions/side effects for all the Medicines you take and that have been prescribed to you. Take any new Medicines after you have completely understood and accpet  all the possible adverse reactions/side effects.   Please note  You were cared for by a hospitalist during your hospital stay. If you have any questions about your discharge medications or the care you received while you were in the hospital after you are discharged, you can call the unit and asked to speak with the hospitalist on call if the hospitalist that took care of you is not available. Once you are discharged, your primary care physician will handle any further medical issues. Please note that NO REFILLS for any discharge medications will be authorized once you are discharged, as it is imperative that you return to your primary care physician (or establish a relationship with a primary care physician if you do not have one) for your aftercare needs so that they can reassess your need for medications and monitor your lab values.    On the day of Discharge:  VITAL SIGNS:  Blood pressure 123/79, pulse 66, temperature 97.8 F (36.6 C), temperature source Oral, resp. rate 19, height 6\' 3"  (1.905 m), weight 121.6 kg (268 lb), SpO2 96 %.  PHYSICAL EXAMINATION:   GENERAL:  26 y.o.-year-old patient lying in the bed with no acute distress.  EYES: Pupils equal, round, reactive to light and accommodation. No scleral icterus. Extraocular muscles intact.  HEENT: Head atraumatic, normocephalic. Oropharynx and nasopharynx clear.  NECK:  Supple, no jugular venous distention. No thyroid enlargement, no tenderness.  LUNGS: Normal breath sounds bilaterally, no wheezing, rales,rhonchi or crepitation. No use of accessory muscles of respiration.  CARDIOVASCULAR: S1, S2 normal. No murmurs, rubs, or gallops.  ABDOMEN: Soft, nontender, nondistended. Bowel sounds present. No organomegaly or mass.  EXTREMITIES: Left leg below-knee Unna wrap is present.  Left thigh superficial bleeding wounds.  Swelling is improved.  No cyanosis, or clubbing.  NEUROLOGIC: Cranial nerves II through XII are intact. Muscle strength  5/5 in all extremities. Sensation intact. Gait not checked.  PSYCHIATRIC: The patient is alert and oriented x 3.  SKIN: No obvious rash, lesion, or ulcer.     DATA REVIEW:   CBC Recent Labs  Lab 10/15/17 0514  WBC 7.4  HGB 8.1*  HCT 24.4*  PLT 696*    Chemistries  Recent Labs  Lab 10/11/17 0104  10/14/17 0359 10/16/17 0440  NA 134*   < > 141 141  K 3.4*   < > 3.5 3.9  CL 98   < > 105 104  CO2 27   < > 28 29  GLUCOSE 103*   < > 133* 97  BUN 10   < > 8 10  CREATININE 0.81   < > 0.70 0.75  CALCIUM 8.2*   < > 8.4* 8.9  MG  --   --  2.0  --   AST 16  --   --   --   ALT 21  --   --   --   ALKPHOS 49  --   --   --   BILITOT 0.6  --   --   --    < > = values in this interval not displayed.     Microbiology Results  Results for orders placed or performed during the hospital encounter of 10/08/17  C difficile quick scan w PCR reflex     Status: None   Collection Time: 10/08/17  6:30 PM  Result Value Ref Range Status   C Diff antigen NEGATIVE NEGATIVE Final   C Diff toxin NEGATIVE NEGATIVE Final   C Diff interpretation No C. difficile detected.  Final    Comment: Performed at [facility], [address] [place], [place] [address]    RADIOLOGY:  No results found.   Management plans discussed with the patient, family and they are in agreement.  CODE STATUS:     Code Status Orders  (From admission, onward)        Start     Ordered   10/08/17 1622  Full code  Continuous     10/08/17 1621    Code Status History    This patient has a current code status but no historical code status.      TOTAL TIME TAKING CARE OF THIS PATIENT: 38 minutes.    Coley Kulikowski M.D on 10/16/2017 at 1:33 PM  Between 7am to 6pm - Pager - [phone]  After 6pm go to [email or web address] - [employer] [facility]  Office  [phone]  CC: Primary care physician; Patient, No Pcp Per   Note: This dictation was prepared with  Dragon dictation along with smaller phrase technology. Any transcriptional errors that result from this process are unintentional.

## 2017-10-18 ENCOUNTER — Telehealth: Payer: Self-pay | Admitting: Urgent Care

## 2017-10-18 ENCOUNTER — Telehealth: Payer: Self-pay | Admitting: *Deleted

## 2017-10-18 DIAGNOSIS — Z792 Long term (current) use of antibiotics: Secondary | ICD-10-CM | POA: Diagnosis not present

## 2017-10-18 DIAGNOSIS — Z452 Encounter for adjustment and management of vascular access device: Secondary | ICD-10-CM | POA: Diagnosis not present

## 2017-10-18 DIAGNOSIS — Z9181 History of falling: Secondary | ICD-10-CM | POA: Diagnosis not present

## 2017-10-18 DIAGNOSIS — C6290 Malignant neoplasm of unspecified testis, unspecified whether descended or undescended: Secondary | ICD-10-CM | POA: Diagnosis not present

## 2017-10-18 DIAGNOSIS — Z7901 Long term (current) use of anticoagulants: Secondary | ICD-10-CM | POA: Diagnosis not present

## 2017-10-18 DIAGNOSIS — L03116 Cellulitis of left lower limb: Secondary | ICD-10-CM | POA: Diagnosis not present

## 2017-10-18 DIAGNOSIS — L982 Febrile neutrophilic dermatosis [Sweet]: Secondary | ICD-10-CM | POA: Diagnosis not present

## 2017-10-18 DIAGNOSIS — D6481 Anemia due to antineoplastic chemotherapy: Secondary | ICD-10-CM | POA: Diagnosis not present

## 2017-10-18 DIAGNOSIS — F17210 Nicotine dependence, cigarettes, uncomplicated: Secondary | ICD-10-CM | POA: Diagnosis not present

## 2017-10-18 DIAGNOSIS — D649 Anemia, unspecified: Secondary | ICD-10-CM | POA: Diagnosis not present

## 2017-10-18 DIAGNOSIS — K219 Gastro-esophageal reflux disease without esophagitis: Secondary | ICD-10-CM | POA: Diagnosis not present

## 2017-10-18 DIAGNOSIS — L988 Other specified disorders of the skin and subcutaneous tissue: Secondary | ICD-10-CM | POA: Diagnosis not present

## 2017-10-18 DIAGNOSIS — Z79891 Long term (current) use of opiate analgesic: Secondary | ICD-10-CM | POA: Diagnosis not present

## 2017-10-18 DIAGNOSIS — J45909 Unspecified asthma, uncomplicated: Secondary | ICD-10-CM | POA: Diagnosis not present

## 2017-10-18 DIAGNOSIS — I824Y2 Acute embolism and thrombosis of unspecified deep veins of left proximal lower extremity: Secondary | ICD-10-CM | POA: Diagnosis not present

## 2017-10-18 DIAGNOSIS — F321 Major depressive disorder, single episode, moderate: Secondary | ICD-10-CM | POA: Diagnosis not present

## 2017-10-18 DIAGNOSIS — I82412 Acute embolism and thrombosis of left femoral vein: Secondary | ICD-10-CM | POA: Diagnosis not present

## 2017-10-18 DIAGNOSIS — F329 Major depressive disorder, single episode, unspecified: Secondary | ICD-10-CM | POA: Diagnosis not present

## 2017-10-18 LAB — PROTHROMBIN GENE MUTATION

## 2017-10-18 LAB — FACTOR 5 LEIDEN

## 2017-10-18 NOTE — Telephone Encounter (Signed)
VO called to April at [facility]

## 2017-10-18 NOTE — Telephone Encounter (Signed)
Edgar [name] from [facility] called asking for  VO for Nsg, physical therapy, OT, and SW Please return her call [phone]

## 2017-10-18 NOTE — Telephone Encounter (Signed)
  Please provide patient with services.  M

## 2017-10-18 NOTE — Telephone Encounter (Signed)
Call placed to dicussed chemotherapy regimen. Care team member unavailable at the time of call. I left a message with Edison Nasuti, asking that I receive a call back to discuss patient's continuing care here at [facility]. We need to determine patient's exact chemotherapy regimen and dosing in order to continue with treatment this week.   Honor Loh, MSN, APRN, FNP-C, CEN Oncology/Hematology Nurse Practitioner  [facility] 10/18/17, 1:12 PM

## 2017-10-20 DIAGNOSIS — L982 Febrile neutrophilic dermatosis [Sweet]: Secondary | ICD-10-CM | POA: Diagnosis not present

## 2017-10-20 DIAGNOSIS — K219 Gastro-esophageal reflux disease without esophagitis: Secondary | ICD-10-CM | POA: Diagnosis not present

## 2017-10-20 DIAGNOSIS — F329 Major depressive disorder, single episode, unspecified: Secondary | ICD-10-CM | POA: Diagnosis not present

## 2017-10-20 DIAGNOSIS — L988 Other specified disorders of the skin and subcutaneous tissue: Secondary | ICD-10-CM | POA: Diagnosis not present

## 2017-10-20 DIAGNOSIS — Z9181 History of falling: Secondary | ICD-10-CM | POA: Diagnosis not present

## 2017-10-20 DIAGNOSIS — Z452 Encounter for adjustment and management of vascular access device: Secondary | ICD-10-CM | POA: Diagnosis not present

## 2017-10-20 DIAGNOSIS — C6290 Malignant neoplasm of unspecified testis, unspecified whether descended or undescended: Secondary | ICD-10-CM | POA: Diagnosis not present

## 2017-10-20 DIAGNOSIS — D6481 Anemia due to antineoplastic chemotherapy: Secondary | ICD-10-CM | POA: Diagnosis not present

## 2017-10-20 DIAGNOSIS — Z79891 Long term (current) use of opiate analgesic: Secondary | ICD-10-CM | POA: Diagnosis not present

## 2017-10-20 DIAGNOSIS — D649 Anemia, unspecified: Secondary | ICD-10-CM | POA: Diagnosis not present

## 2017-10-20 DIAGNOSIS — I824Y2 Acute embolism and thrombosis of unspecified deep veins of left proximal lower extremity: Secondary | ICD-10-CM | POA: Diagnosis not present

## 2017-10-20 DIAGNOSIS — F321 Major depressive disorder, single episode, moderate: Secondary | ICD-10-CM | POA: Diagnosis not present

## 2017-10-20 DIAGNOSIS — L03116 Cellulitis of left lower limb: Secondary | ICD-10-CM | POA: Diagnosis not present

## 2017-10-20 DIAGNOSIS — Z7901 Long term (current) use of anticoagulants: Secondary | ICD-10-CM | POA: Diagnosis not present

## 2017-10-20 DIAGNOSIS — I82412 Acute embolism and thrombosis of left femoral vein: Secondary | ICD-10-CM | POA: Diagnosis not present

## 2017-10-20 DIAGNOSIS — F17210 Nicotine dependence, cigarettes, uncomplicated: Secondary | ICD-10-CM | POA: Diagnosis not present

## 2017-10-20 DIAGNOSIS — J45909 Unspecified asthma, uncomplicated: Secondary | ICD-10-CM | POA: Diagnosis not present

## 2017-10-20 DIAGNOSIS — Z792 Long term (current) use of antibiotics: Secondary | ICD-10-CM | POA: Diagnosis not present

## 2017-10-21 ENCOUNTER — Other Ambulatory Visit: Payer: Self-pay | Admitting: Hematology and Oncology

## 2017-10-21 ENCOUNTER — Inpatient Hospital Stay: Payer: BLUE CROSS/BLUE SHIELD | Attending: Hematology and Oncology | Admitting: Hematology and Oncology

## 2017-10-21 ENCOUNTER — Inpatient Hospital Stay: Payer: BLUE CROSS/BLUE SHIELD

## 2017-10-21 ENCOUNTER — Encounter: Payer: Self-pay | Admitting: Hematology and Oncology

## 2017-10-21 ENCOUNTER — Other Ambulatory Visit: Payer: Self-pay

## 2017-10-21 ENCOUNTER — Telehealth: Payer: Self-pay | Admitting: *Deleted

## 2017-10-21 ENCOUNTER — Inpatient Hospital Stay
Admission: AD | Admit: 2017-10-21 | Discharge: 2017-10-26 | DRG: 847 | Disposition: A | Discharge status: Home / Self Care | Payer: BLUE CROSS/BLUE SHIELD | Source: Ambulatory Visit | Attending: Internal Medicine | Admitting: Internal Medicine

## 2017-10-21 VITALS — BP 141/93 | HR 96 | Temp 96.1°F | Resp 18 | Wt 251.6 lb

## 2017-10-21 DIAGNOSIS — K219 Gastro-esophageal reflux disease without esophagitis: Secondary | ICD-10-CM | POA: Diagnosis present

## 2017-10-21 DIAGNOSIS — H9313 Tinnitus, bilateral: Secondary | ICD-10-CM

## 2017-10-21 DIAGNOSIS — S91331A Puncture wound without foreign body, right foot, initial encounter: Secondary | ICD-10-CM | POA: Diagnosis not present

## 2017-10-21 DIAGNOSIS — Z09 Encounter for follow-up examination after completed treatment for conditions other than malignant neoplasm: Secondary | ICD-10-CM

## 2017-10-21 DIAGNOSIS — C6212 Malignant neoplasm of descended left testis: Secondary | ICD-10-CM | POA: Insufficient documentation

## 2017-10-21 DIAGNOSIS — E876 Hypokalemia: Secondary | ICD-10-CM

## 2017-10-21 DIAGNOSIS — Z7901 Long term (current) use of anticoagulants: Secondary | ICD-10-CM | POA: Diagnosis not present

## 2017-10-21 DIAGNOSIS — D6481 Anemia due to antineoplastic chemotherapy: Secondary | ICD-10-CM | POA: Insufficient documentation

## 2017-10-21 DIAGNOSIS — M898X9 Other specified disorders of bone, unspecified site: Secondary | ICD-10-CM | POA: Insufficient documentation

## 2017-10-21 DIAGNOSIS — C6290 Malignant neoplasm of unspecified testis, unspecified whether descended or undescended: Secondary | ICD-10-CM | POA: Diagnosis not present

## 2017-10-21 DIAGNOSIS — Z716 Tobacco abuse counseling: Secondary | ICD-10-CM | POA: Diagnosis not present

## 2017-10-21 DIAGNOSIS — R000 Tachycardia, unspecified: Secondary | ICD-10-CM | POA: Insufficient documentation

## 2017-10-21 DIAGNOSIS — Z959 Presence of cardiac and vascular implant and graft, unspecified: Secondary | ICD-10-CM | POA: Diagnosis not present

## 2017-10-21 DIAGNOSIS — B009 Herpesviral infection, unspecified: Secondary | ICD-10-CM | POA: Diagnosis not present

## 2017-10-21 DIAGNOSIS — F17210 Nicotine dependence, cigarettes, uncomplicated: Secondary | ICD-10-CM | POA: Diagnosis present

## 2017-10-21 DIAGNOSIS — L988 Other specified disorders of the skin and subcutaneous tissue: Secondary | ICD-10-CM | POA: Diagnosis not present

## 2017-10-21 DIAGNOSIS — L03116 Cellulitis of left lower limb: Secondary | ICD-10-CM

## 2017-10-21 DIAGNOSIS — R432 Parageusia: Secondary | ICD-10-CM | POA: Insufficient documentation

## 2017-10-21 DIAGNOSIS — Z95828 Presence of other vascular implants and grafts: Secondary | ICD-10-CM

## 2017-10-21 DIAGNOSIS — Z823 Family history of stroke: Secondary | ICD-10-CM

## 2017-10-21 DIAGNOSIS — Z5111 Encounter for antineoplastic chemotherapy: Principal | ICD-10-CM

## 2017-10-21 DIAGNOSIS — I82402 Acute embolism and thrombosis of unspecified deep veins of left lower extremity: Secondary | ICD-10-CM | POA: Diagnosis not present

## 2017-10-21 DIAGNOSIS — Z8547 Personal history of malignant neoplasm of testis: Secondary | ICD-10-CM

## 2017-10-21 DIAGNOSIS — W450XXA Nail entering through skin, initial encounter: Secondary | ICD-10-CM | POA: Insufficient documentation

## 2017-10-21 DIAGNOSIS — T451X5A Adverse effect of antineoplastic and immunosuppressive drugs, initial encounter: Secondary | ICD-10-CM | POA: Diagnosis not present

## 2017-10-21 DIAGNOSIS — C6292 Malignant neoplasm of left testis, unspecified whether descended or undescended: Secondary | ICD-10-CM

## 2017-10-21 DIAGNOSIS — Z818 Family history of other mental and behavioral disorders: Secondary | ICD-10-CM | POA: Diagnosis not present

## 2017-10-21 DIAGNOSIS — Z9079 Acquired absence of other genital organ(s): Secondary | ICD-10-CM | POA: Diagnosis not present

## 2017-10-21 DIAGNOSIS — F1290 Cannabis use, unspecified, uncomplicated: Secondary | ICD-10-CM | POA: Diagnosis present

## 2017-10-21 DIAGNOSIS — I878 Other specified disorders of veins: Secondary | ICD-10-CM | POA: Diagnosis present

## 2017-10-21 DIAGNOSIS — Z86718 Personal history of other venous thrombosis and embolism: Secondary | ICD-10-CM

## 2017-10-21 DIAGNOSIS — Z800 Family history of malignant neoplasm of digestive organs: Secondary | ICD-10-CM | POA: Diagnosis not present

## 2017-10-21 DIAGNOSIS — Z809 Family history of malignant neoplasm, unspecified: Secondary | ICD-10-CM | POA: Diagnosis not present

## 2017-10-21 DIAGNOSIS — Z79899 Other long term (current) drug therapy: Secondary | ICD-10-CM | POA: Insufficient documentation

## 2017-10-21 DIAGNOSIS — F321 Major depressive disorder, single episode, moderate: Secondary | ICD-10-CM | POA: Diagnosis not present

## 2017-10-21 DIAGNOSIS — D72819 Decreased white blood cell count, unspecified: Secondary | ICD-10-CM | POA: Diagnosis present

## 2017-10-21 DIAGNOSIS — J45909 Unspecified asthma, uncomplicated: Secondary | ICD-10-CM | POA: Insufficient documentation

## 2017-10-21 DIAGNOSIS — Z7189 Other specified counseling: Secondary | ICD-10-CM

## 2017-10-21 DIAGNOSIS — F329 Major depressive disorder, single episode, unspecified: Secondary | ICD-10-CM | POA: Diagnosis not present

## 2017-10-21 DIAGNOSIS — Z452 Encounter for adjustment and management of vascular access device: Secondary | ICD-10-CM | POA: Diagnosis not present

## 2017-10-21 DIAGNOSIS — H9319 Tinnitus, unspecified ear: Secondary | ICD-10-CM | POA: Diagnosis present

## 2017-10-21 DIAGNOSIS — C6211 Malignant neoplasm of descended right testis: Secondary | ICD-10-CM | POA: Diagnosis present

## 2017-10-21 DIAGNOSIS — C632 Malignant neoplasm of scrotum: Secondary | ICD-10-CM | POA: Diagnosis present

## 2017-10-21 DIAGNOSIS — G629 Polyneuropathy, unspecified: Secondary | ICD-10-CM | POA: Insufficient documentation

## 2017-10-21 DIAGNOSIS — Z79891 Long term (current) use of opiate analgesic: Secondary | ICD-10-CM | POA: Diagnosis not present

## 2017-10-21 DIAGNOSIS — Z803 Family history of malignant neoplasm of breast: Secondary | ICD-10-CM | POA: Insufficient documentation

## 2017-10-21 DIAGNOSIS — Z9221 Personal history of antineoplastic chemotherapy: Secondary | ICD-10-CM

## 2017-10-21 DIAGNOSIS — D649 Anemia, unspecified: Secondary | ICD-10-CM | POA: Diagnosis not present

## 2017-10-21 DIAGNOSIS — L982 Febrile neutrophilic dermatosis [Sweet]: Secondary | ICD-10-CM | POA: Diagnosis not present

## 2017-10-21 DIAGNOSIS — I82412 Acute embolism and thrombosis of left femoral vein: Secondary | ICD-10-CM | POA: Diagnosis not present

## 2017-10-21 DIAGNOSIS — I824Y2 Acute embolism and thrombosis of unspecified deep veins of left proximal lower extremity: Secondary | ICD-10-CM

## 2017-10-21 DIAGNOSIS — Z9181 History of falling: Secondary | ICD-10-CM | POA: Diagnosis not present

## 2017-10-21 DIAGNOSIS — Z792 Long term (current) use of antibiotics: Secondary | ICD-10-CM | POA: Diagnosis not present

## 2017-10-21 HISTORY — DX: Acute embolism and thrombosis of unspecified deep veins of unspecified lower extremity: I82.409

## 2017-10-21 LAB — CBC WITH DIFFERENTIAL/PLATELET
BASOS PCT: 1 %
Basophils Absolute: 0.1 10*3/uL (ref 0–0.1)
EOS ABS: 0 10*3/uL (ref 0–0.7)
EOS PCT: 1 %
HEMATOCRIT: 29.7 % — AB (ref 40.0–52.0)
Hemoglobin: 10.2 g/dL — ABNORMAL LOW (ref 13.0–18.0)
Lymphocytes Relative: 18 %
Lymphs Abs: 1.4 10*3/uL (ref 1.0–3.6)
MCH: 31.7 pg (ref 26.0–34.0)
MCHC: 34.4 g/dL (ref 32.0–36.0)
MCV: 92 fL (ref 80.0–100.0)
MONO ABS: 1.2 10*3/uL — AB (ref 0.2–1.0)
MONOS PCT: 16 %
NEUTROS ABS: 4.8 10*3/uL (ref 1.4–6.5)
Neutrophils Relative %: 64 %
PLATELETS: 701 10*3/uL — AB (ref 150–440)
RBC: 3.23 MIL/uL — ABNORMAL LOW (ref 4.40–5.90)
RDW: 15.7 % — AB (ref 11.5–14.5)
WBC: 7.5 10*3/uL (ref 3.8–10.6)

## 2017-10-21 LAB — URINALYSIS, COMPLETE (UACMP) WITH MICROSCOPIC
Bilirubin Urine: NEGATIVE
Glucose, UA: NEGATIVE mg/dL
Hgb urine dipstick: NEGATIVE
Ketones, ur: NEGATIVE mg/dL
Nitrite: NEGATIVE
Protein, ur: NEGATIVE mg/dL
Specific Gravity, Urine: 1.018 (ref 1.005–1.030)
Squamous Epithelial / LPF: NONE SEEN (ref 0–5)
pH: 5 (ref 5.0–8.0)

## 2017-10-21 LAB — COMPREHENSIVE METABOLIC PANEL
ALT: 23 U/L (ref 0–44)
AST: 23 U/L (ref 15–41)
Albumin: 4 g/dL (ref 3.5–5.0)
Alkaline Phosphatase: 54 U/L (ref 38–126)
Anion gap: 10 (ref 5–15)
BUN: 12 mg/dL (ref 6–20)
CO2: 24 mmol/L (ref 22–32)
Calcium: 9.7 mg/dL (ref 8.9–10.3)
Chloride: 103 mmol/L (ref 98–111)
Creatinine, Ser: 0.84 mg/dL (ref 0.61–1.24)
GFR calc Af Amer: >60 mL/min (ref >60)
GFR calc non Af Amer: >60 mL/min (ref >60)
Glucose, Bld: 100 mg/dL — ABNORMAL HIGH (ref 70–99)
Potassium: 4.4 mmol/L (ref 3.5–5.1)
Sodium: 137 mmol/L (ref 135–145)
Total Bilirubin: 0.3 mg/dL (ref 0.3–1.2)
Total Protein: 7.8 g/dL (ref 6.5–8.1)

## 2017-10-21 LAB — LACTATE DEHYDROGENASE: LDH: 144 U/L (ref 98–192)

## 2017-10-21 LAB — MAGNESIUM
Magnesium: 2 mg/dL (ref 1.7–2.4)
Magnesium: 2 mg/dL (ref 1.7–2.4)

## 2017-10-21 LAB — PHOSPHORUS: Phosphorus: 4.4 mg/dL (ref 2.5–4.6)

## 2017-10-21 MED ORDER — DOCUSATE SODIUM 100 MG PO CAPS: 100.0000 mg | ORAL_CAPSULE | Freq: Two times a day (BID) | ORAL | Status: DC | PRN

## 2017-10-21 MED ORDER — SODIUM CHLORIDE 0.9% FLUSH
10.0000 mL | INTRAVENOUS | Status: DC | PRN
  Administered 2017-10-21: 10 mL via INTRAVENOUS
  Filled 2017-10-21: qty 10

## 2017-10-21 MED ORDER — DIPHENHYDRAMINE HCL 50 MG/ML IJ SOLN
50.0000 mg | Freq: Once | INTRAMUSCULAR | Status: AC
  Administered 2017-10-21: 50 mg via INTRAVENOUS
  Filled 2017-10-21: qty 1

## 2017-10-21 MED ORDER — FAMOTIDINE IN NACL 20-0.9 MG/50ML-% IV SOLN
20.0000 mg | Freq: Once | INTRAVENOUS | Status: AC
  Administered 2017-10-21: 21:00:00 20 mg via INTRAVENOUS
  Filled 2017-10-21: qty 50

## 2017-10-21 MED ORDER — SODIUM CHLORIDE 0.9 % IV SOLN
Freq: Once | INTRAVENOUS | Status: AC
  Administered 2017-10-21: 20 mg via INTRAVENOUS
  Filled 2017-10-21: qty 2

## 2017-10-21 MED ORDER — METRONIDAZOLE 500 MG PO TABS
500.0000 mg | ORAL_TABLET | Freq: Three times a day (TID) | ORAL | Status: DC
  Administered 2017-10-21 – 2017-10-22 (×3): 500 mg via ORAL
  Filled 2017-10-21 (×3): qty 1

## 2017-10-21 MED ORDER — SILVER SULFADIAZINE 1 % EX CREA
1.0000 "application " | TOPICAL_CREAM | Freq: Two times a day (BID) | CUTANEOUS | Status: DC
  Filled 2017-10-21: qty 85

## 2017-10-21 MED ORDER — MAGNESIUM OXIDE 400 (241.3 MG) MG PO TABS
400.0000 mg | ORAL_TABLET | Freq: Every day | ORAL | Status: DC
  Administered 2017-10-22 – 2017-10-26 (×5): 400 mg via ORAL
  Filled 2017-10-21 (×5): qty 1

## 2017-10-21 MED ORDER — PROCHLORPERAZINE MALEATE 10 MG PO TABS
10.0000 mg | ORAL_TABLET | Freq: Four times a day (QID) | ORAL | Status: DC | PRN
  Filled 2017-10-21: qty 1

## 2017-10-21 MED ORDER — SODIUM CHLORIDE 0.9 % IV SOLN
Freq: Once | INTRAVENOUS | Status: DC
  Filled 2017-10-21: qty 4

## 2017-10-21 MED ORDER — ACETAMINOPHEN 500 MG PO TABS: 500.0000 mg | ORAL_TABLET | Freq: Four times a day (QID) | ORAL | Status: DC | PRN

## 2017-10-21 MED ORDER — DEXAMETHASONE 4 MG PO TABS
4.0000 mg | ORAL_TABLET | Freq: Every day | ORAL | Status: DC
  Administered 2017-10-22 – 2017-10-26 (×5): 4 mg via ORAL
  Filled 2017-10-21 (×5): qty 1

## 2017-10-21 MED ORDER — DOXYCYCLINE HYCLATE 100 MG PO TABS
100.0000 mg | ORAL_TABLET | Freq: Two times a day (BID) | ORAL | Status: DC
  Administered 2017-10-21 – 2017-10-22 (×2): 100 mg via ORAL
  Filled 2017-10-21 (×4): qty 1

## 2017-10-21 MED ORDER — SODIUM CHLORIDE 0.9 % IV SOLN
INTRAVENOUS | Status: DC
  Administered 2017-10-21 – 2017-10-25 (×5): via INTRAVENOUS

## 2017-10-21 MED ORDER — GABAPENTIN 300 MG PO CAPS
300.0000 mg | ORAL_CAPSULE | Freq: Every day | ORAL | Status: DC
  Administered 2017-10-21 – 2017-10-25 (×5): 300 mg via ORAL
  Filled 2017-10-21 (×5): qty 1

## 2017-10-21 MED ORDER — SILVER SULFADIAZINE 1 % EX CREA
1.0000 | TOPICAL_CREAM | Freq: Two times a day (BID) | CUTANEOUS | Status: DC
Start: 2017-10-21 — End: 2017-10-26
  Administered 2017-10-22 – 2017-10-25 (×8): 1 via TOPICAL
  Filled 2017-10-21 (×2): qty 85

## 2017-10-21 MED ORDER — LEVOFLOXACIN 500 MG PO TABS
500.0000 mg | ORAL_TABLET | Freq: Every day | ORAL | Status: DC
  Administered 2017-10-22: 10:00:00 500 mg via ORAL
  Filled 2017-10-21: qty 1

## 2017-10-21 MED ORDER — SODIUM CHLORIDE 0.9 % IV SOLN
600.0000 mg | Freq: Once | INTRAVENOUS | Status: AC
  Administered 2017-10-21: 21:00:00 600 mg via INTRAVENOUS
  Filled 2017-10-21: qty 100

## 2017-10-21 MED ORDER — OXYCODONE HCL 5 MG PO TABS
10.0000 mg | ORAL_TABLET | Freq: Four times a day (QID) | ORAL | Status: DC | PRN
  Administered 2017-10-21 – 2017-10-26 (×18): 10 mg via ORAL
  Filled 2017-10-21 (×18): qty 2

## 2017-10-21 MED ORDER — NICOTINE 21 MG/24HR TD PT24
21.0000 mg | MEDICATED_PATCH | Freq: Every day | TRANSDERMAL | Status: DC
  Administered 2017-10-22 – 2017-10-26 (×5): 21 mg via TRANSDERMAL
  Filled 2017-10-21 (×5): qty 1

## 2017-10-21 MED ORDER — RIVAROXABAN 20 MG PO TABS
20.0000 mg | ORAL_TABLET | Freq: Every day | ORAL | Status: DC
  Administered 2017-10-21 – 2017-10-25 (×5): 20 mg via ORAL
  Filled 2017-10-21 (×6): qty 1

## 2017-10-21 MED ORDER — DIPHENHYDRAMINE HCL 50 MG/ML IJ SOLN
50.0000 mg | Freq: Once | INTRAMUSCULAR | Status: DC
  Filled 2017-10-21: qty 1

## 2017-10-21 MED ORDER — SODIUM CHLORIDE 0.9 % IV SOLN
600.0000 mg | Freq: Once | INTRAVENOUS | Status: DC
  Filled 2017-10-21: qty 100

## 2017-10-21 MED ORDER — DULOXETINE HCL 30 MG PO CPEP
60.0000 mg | ORAL_CAPSULE | Freq: Every day | ORAL | Status: DC
  Administered 2017-10-22 – 2017-10-26 (×5): 60 mg via ORAL
  Filled 2017-10-21 (×5): qty 2

## 2017-10-21 MED ORDER — FAMOTIDINE IN NACL 20-0.9 MG/50ML-% IV SOLN: 20.0000 mg | Freq: Once | INTRAVENOUS | Status: DC

## 2017-10-21 MED ORDER — OXYCODONE HCL ER 10 MG PO T12A
10.0000 mg | EXTENDED_RELEASE_TABLET | Freq: Two times a day (BID) | ORAL | Status: DC
  Administered 2017-10-21 – 2017-10-26 (×11): 10 mg via ORAL
  Filled 2017-10-21 (×12): qty 1

## 2017-10-21 MED ORDER — HEPARIN SOD (PORK) LOCK FLUSH 100 UNIT/ML IV SOLN: 500.0000 [IU] | Freq: Once | INTRAVENOUS | Status: DC

## 2017-10-21 MED ORDER — POLYETHYLENE GLYCOL 3350 17 G PO PACK: 17.0000 g | PACK | Freq: Every day | ORAL | Status: DC | PRN

## 2017-10-21 NOTE — Progress Notes (Addendum)
[facility] day:  10/21/2017  Chief Complaint: Edgar [name] is a 27 y.o. male with recurrent testicular cancer who is seen for assessment prior to cycle #2 TIP chemotherapy.  HPI: The patient was last seen in the medical oncology clinic on 10/08/2017.  At that time,he had persistent fevers, increased redness of the left thigh with new necrotic skin lesions.  He is unable to ambulate.  He was admitted to [facility] with cellulitis.Marland Kitchen  He was admitted from 10/08/2017 - 10/16/2017.  Xarelto was switched to heparin.  Clindamycin was switched to Cefepime and vancomycin.  Fevers resolved.  Imaging revealed significant improvement in adenopathy with decreased pelvic sidewall disease and decreased bulk at aortic bifurcation.   He was felt to have venous congestion syndrome with skin lesions due to congestion.    He was seen by Dr. Delana Meyer of vascular surgery.  He underwent IVC filter placement, percutaneous angioplasty and stent placement of the left common iliac vein and left external iliac vein on 10/13/2017.  Skin lesions were debrided.  Pain and swelling dramatically improved.  Heparin was switched to Xarelto on 10/15/2017.  He received wound care with topical use of Silvadene cream.  He was discharged on Flagyl, doxycycline, and Levaquin.  He underwent a hypercoagulable work-up.  Hypercoagulable work-up negative for the following: Factor V Leiden, prothrombin gene mutation, lupus anticoagulant, anticardiolipin antibodies, beta-2 glycoprotein, protein C activity/antigen, protein S activity/antigen.  ATIII was 54% (75-120) on heparin.  During the interim, patient notes that he is "getting better everyday". Patient denies nausea, vomiting, and diarrhea. No recurrent fevers. He continues with wound care at home. Patient is having is LEFT lower extremity (thigh) wrapped twice a day. Previous necrotic areas to LEFT thigh are improving with SSD cream, covered by Xeroform,  and secured with compression bandages. Patient's ambulation has markedly improved since his last visit. The swelling in his leg has improved following intervention by vascular surgeon. He has continued pain, however notes that it is managed with Roxicodone 10 mg every 4-6 hours as needed.   Patient has some minimal (intermittent) neuropathy in his hands and toes. Symptom does not limit day to day function in any way. Patient notes that he has mild tinnitus. Patient is grossly neurologically intact. There is no dysmetria noted.    Past Medical History:  Diagnosis Date  . Asthma    AS A CHILD-NO INHALERS  . Cancer ([place])    testicular cancer 11/2016 per pt   . GERD (gastroesophageal reflux disease)    TUMS PRN    Past Surgical History:  Procedure Laterality Date  . ANKLE FRACTURE SURGERY Right 2004  . ORCHIECTOMY Left 11/26/2015   Procedure: ORCHIECTOMY;  Surgeon: [name], MD;  Location: [facility] ORS;  Service: Urology;  Laterality: Left;  radical/Inguinal approach  . PERIPHERAL VASCULAR CATHETERIZATION N/A 12/16/2015   Procedure: [name] Cath Insertion;  Surgeon: [name], MD;  Location: [facility];  Service: Cardiovascular;  Laterality: N/A;  . PERIPHERAL VASCULAR THROMBECTOMY Left 10/13/2017   Procedure: PERIPHERAL VASCULAR THROMBECTOMY;  Surgeon: [name], MD;  Location: [facility];  Service: Cardiovascular;  Laterality: Left;  . WISDOM TOOTH EXTRACTION  2017    Family History  Problem Relation Age of Onset  . Skin cancer Mother   . Breast cancer Maternal Grandmother   . Colon cancer Maternal Grandfather   . Diabetes Maternal Grandfather   . Emphysema Father   . Mental illness Sister  anxiety  . Stroke Paternal Grandmother   . Prostate cancer Neg Hx   . Kidney cancer Neg Hx   . Bladder Cancer Neg Hx     Social History:  reports that he has been smoking cigarettes. He has a 4.00 pack-year smoking history. He has quit using smokeless  tobacco.  His smokeless tobacco use included snuff. He reports that he has current or past drug history. Drug: Marijuana. He reports that he does not drink alcohol.  He smokes 1/2 pack a day.  He smokes marijuana.  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  He has a 64 year-old-daughter named Edgar [name]. The patient's mother's cell phone number is [phone].  The patient lives in [place]. He is accompanied by his mother today.   Allergies: No Known Allergies  Current Medications: Current Outpatient Medications  Medication Sig Dispense Refill  . acetaminophen (TYLENOL) 500 MG tablet Take 500-1,000 mg by mouth every 6 (six) hours as needed for mild pain or fever.     Marland Kitchen dexamethasone (DECADRON) 4 MG tablet Take 4 mg by mouth daily.    Marland Kitchen doxycycline (VIBRA-TABS) 100 MG tablet Take 1 tablet (100 mg total) by mouth 2 (two) times daily for 10 days. 20 tablet 0  . DULoxetine (CYMBALTA) 60 MG capsule Take 1 tablet by mouth daily.    Marland Kitchen gabapentin (NEURONTIN) 300 MG capsule Take 1 capsule (300 mg total) by mouth at bedtime. 30 capsule 0  . levofloxacin (LEVAQUIN) 500 MG tablet Take 1 tablet (500 mg total) by mouth daily for 10 days. 10 tablet 0  . magnesium oxide (MAG-OX) 400 MG tablet Take 1 tablet by mouth daily.    . metroNIDAZOLE (FLAGYL) 500 MG tablet Take 1 tablet (500 mg total) by mouth 3 (three) times daily for 10 days. 30 tablet 0  . nicotine (NICODERM CQ - DOSED IN MG/24 HR) 7 mg/24hr patch Place onto the skin daily.     . ondansetron (ZOFRAN-ODT) 4 MG disintegrating tablet Take 2 tablets by mouth every 8 (eight) hours as needed.    Marland Kitchen oxyCODONE (OXYCONTIN) 10 mg 12 hr tablet Take 1 tablet (10 mg total) by mouth every 12 (twelve) hours for 7 days. 14 tablet 0  . Oxycodone HCl 10 MG TABS Take 1 tablet (10 mg total) by mouth every 6 (six) hours as needed (moderate and severe pain). 25 tablet 0  . polyethylene glycol (MIRALAX / GLYCOLAX) packet Take 17 g by mouth daily as needed for  mild constipation. 14 each 0  . potassium chloride (KLOR-CON) 20 MEQ packet Take 20 mEq by mouth 2 (two) times daily.     . prochlorperazine (COMPAZINE) 10 MG tablet Take 1 tablet by mouth every 6 (six) hours as needed (nausea/vomiting).     . silver sulfADIAZINE (SILVADENE) 1 % cream Apply topically 2 (two) times daily. Apply to blisters on left thigh and cover with nonadherent dressing 50 g 0  . XARELTO 20 MG TABS tablet Take 1 tablet by mouth daily.     No current facility-administered medications for this visit.     Review of Systems:  GENERAL:  Feels "better every day".  No fevers, sweats or weight loss. PERFORMANCE STATUS (ECOG):  1 HEENT:  Slight tinnitus.  No visual changes, runny nose, sore throat, mouth sores or tenderness. Lungs: No shortness of breath or cough.  No hemoptysis. Cardiac:  No chest pain, palpitations, orthopnea, or PND. GI:  No nausea, vomiting, diarrhea, constipation, melena or  hematochezia. GU:  No urgency, frequency, dysuria, or hematuria. Musculoskeletal:  Left leg dramatically better.  No back pain.  No joint pain.  No muscle tenderness. Extremities:  No pain or swelling. Skin:  Skin lesions healing.  No redness or drainage. Neuro:  Minimal neuropathy.  No headache, numbness or weakness, balance or coordination issues. Endocrine:  No diabetes, thyroid issues, hot flashes or night sweats. Psych:  No mood changes, depression or anxiety. Pain:  Minimal left leg pain, using oxycodone 10 mg BID. Review of systems:  All other systems reviewed and found to be negative.  Physical Exam:  Blood pressure (!) 141/93, pulse 96, temperature (!) 96.1 F (35.6 C), temperature source Tympanic, resp. rate 18, weight 251 lb 9 oz (114.1 kg), SpO2 96 %. GENERAL:  Well developed, well nourished, gentleman sitting comfortably in the exam room in no acute distress. MENTAL STATUS:  Alert and oriented to person, place and time. HEAD:  Wearing a cap.  Alopecia totalis.   Normocephalic, atraumatic, face symmetric, no Cushingoid features. EYES:  Brown eyes.  Pupils equal round and reactive to light and accomodation.  No conjunctivitis or scleral icterus. ENT:  Oropharynx clear without lesion.  Tongue normal. Mucous membranes moist.  RESPIRATORY:  Clear to auscultation without rales, wheezes or rhonchi. CARDIOVASCULAR:  Regular rate and rhythm without murmur, rub or gallop. ABDOMEN:  Soft, non-tender, with active bowel sounds, and no hepatosplenomegaly.  No masses. SKIN:  Right upper extremity nicotine patch.  Left thigh without erythema.  Anterior thigh with 5 lesions and posterior thigh 1 lesion.  Lesions covered in Xeroform.  Underlying tissue less angry, drying. EXTREMITIES: Left proximal and left distal extremity separately wrapped with compression bandages.  Upper dressing removed.  Markedly improved left lower extremity edema. No palpable cords. LYMPH NODES: No palpable cervical, supraclavicular, axillary or inguinal adenopathy  NEUROLOGICAL: Unremarkable. PSYCH:  Appropriate.   Imaging studies: 11/28/2015:  Chest, abdomen, and pelvic CT revealed no mediastinal adenopathy or suspicious pulmonary nodules.   There were 2 prominent left periaortic retroperitoneal lymph nodes (1.7 cm and 0.9 cm) concerning for testicular nodal metastasis.  There were no abnormal lymph nodes above the renal veins.  There were postsurgical change in the left hemiscrotum and left inguinal canal.  There was a 3.3 cm soft tissue abnormality anterior to the left iliopsoas muscle which could represent a metastatic lymph node (N2 lesion).  12/17/2015:  Head MRI revealed no evidence of metastatic disease.   03/30/2016:  Chest, abdomen, and pelvic CT revealed interval resolution of left abdominal/pelvic lymphadenopathy.  There was no residual metastatic disease. 09/30/2016:  Chest, abdomen, and pelvic CT revealed no evidence of recurrent or metastatic disease.  09/10/2017:  Bilateral lower  extremity duplex revealed evidence of obstruction in the left CFV, SFJ, and proximal femoral vein.  09/10/2017:  CT pelvic venogram revealed multiple retroperitoneal masses including large mass within the left iliacus concerning for metastatic disease in the setting of prior testicular cancer.  Mass in the left iliacus caused significant narrowing of the proximal left external iliac vein. There was filling defect within the distal left external iliac vein extending into the common femoral and proximal superficial femoral veins consistent with thrombus  09/11/2017:  Chest, abdomen, and pelvic CT revealed enlarged and morphologically abnormal 4.3 x 4.9 cm left pelvic sidewall and retroperitoneal lymph nodes (measuring up to 2.9 cm) concerning for metastatic disease given history of testicular cancer. Biopsy was suggested for confirmation. Prominent left inguinal lymph nodes could be metastatic or reactive  in nature.  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  There was anasarca/swelling of the left lower extremity.  There was no evidence of metastatic disease in the chest. 09/13/2017:  Head MRI revealed no evidence of metastatic disease.   No visits with results within 3 Day(s) from this visit.  Latest known visit with results is:  Admission on 10/08/2017, Discharged on 10/16/2017  Component Date Value Ref Range Status  . Sodium 10/08/2017 133* 135 - 145 mmol/L Final  . Potassium 10/08/2017 3.6  3.5 - 5.1 mmol/L Final  . Chloride 10/08/2017 97* 98 - 111 mmol/L Final  . CO2 10/08/2017 28  22 - 32 mmol/L Final  . Glucose, Bld 10/08/2017 117* 70 - 99 mg/dL Final  . BUN 10/08/2017 10  6 - 20 mg/dL Final  . Creatinine, Ser 10/08/2017 0.95  0.61 - 1.24 mg/dL Final  . Calcium 10/08/2017 8.5* 8.9 - 10.3 mg/dL Final  . Total Protein 10/08/2017 6.4* 6.5 - 8.1 g/dL Final  . Albumin 10/08/2017 3.0* 3.5 - 5.0 g/dL Final  . AST 10/08/2017 14*  15 - 41 U/L Final  . ALT 10/08/2017 12  0 - 44 U/L Final  . Alkaline Phosphatase 10/08/2017 54  38 - 126 U/L Final  . Total Bilirubin 10/08/2017 0.6  0.3 - 1.2 mg/dL Final  . GFR calc non Af Amer 10/08/2017 >60  >60 mL/min Final  . GFR calc Af Amer 10/08/2017 >60  >60 mL/min Final   Comment: (NOTE) The eGFR has been calculated using the CKD EPI equation. This calculation has not been validated in all clinical situations. eGFR's persistently <60 mL/min signify possible Chronic Kidney Disease.   Georgiann Hahn gap 10/08/2017 8  5 - 15 Final   Performed at [facility], [address] [place], [place] [address]  . WBC 10/08/2017 12.1* 3.8 - 10.6 K/uL Final  . RBC 10/08/2017 2.93* 4.40 - 5.90 MIL/uL Final  . Hemoglobin 10/08/2017 8.9* 13.0 - 18.0 g/dL Final  . HCT 10/08/2017 26.3* 40.0 - 52.0 % Final  . MCV 10/08/2017 89.9  80.0 - 100.0 fL Final  . MCH 10/08/2017 30.3  26.0 - 34.0 pg Final  . MCHC 10/08/2017 33.7  32.0 - 36.0 g/dL Final  . RDW 10/08/2017 12.4  11.5 - 14.5 % Final  . Platelets 10/08/2017 137* 150 - 440 K/uL Final   Comment: RESULT REPEATED AND VERIFIED Performed at [facility], [address] [place], [place] [address]   . Prothrombin Time 10/08/2017 20.9* 11.4 - 15.2 seconds Final  . INR 10/08/2017 1.82   Final   Performed at [facility], [address] [place], [place] [address]  . aPTT 10/08/2017 44* 24 - 36 seconds Final   Comment:        IF BASELINE aPTT IS ELEVATED, SUGGEST PATIENT RISK ASSESSMENT BE USED TO DETERMINE APPROPRIATE ANTICOAGULANT THERAPY. Performed at [facility], [address] [place], [place] [address]   . Heparin Unfractionated 10/08/2017 3.16* 0.30 - 0.70 IU/mL Final   Comment: RESULTS CONFIRMED BY MANUAL DILUTION Performed at [facility], [address] [place], [place] [address]   . Lactic Acid, Venous 10/08/2017 1.3  0.5 - 1.9 mmol/L Final   Performed at [facility], [address] [place],  [address]  . Lactic Acid, Venous 10/08/2017 0.9  0.5 - 1.9 mmol/L Final   Performed at [facility], [place],  [place] [address]  . HIV Screen 4th Generation wRfx 10/08/2017 Non Reactive  Non Reactive Final   Comment: (NOTE) Performed At: [facility] [place], [place] [id number] Rush Farmer MD [id number]   . Sodium 10/09/2017 136  135 - 145 mmol/L Final  . Potassium 10/09/2017 3.6  3.5 - 5.1 mmol/L Final  . Chloride 10/09/2017 101  98 - 111 mmol/L Final  . CO2 10/09/2017 29  22 - 32 mmol/L Final  . Glucose, Bld 10/09/2017 100* 70 - 99 mg/dL Final  . BUN 10/09/2017 10  6 - 20 mg/dL Final  . Creatinine, Ser 10/09/2017 0.92  0.61 - 1.24 mg/dL Final  . Calcium 10/09/2017 8.1* 8.9 - 10.3 mg/dL Final  . GFR calc non Af Amer 10/09/2017 >60  >60 mL/min Final  . GFR calc Af Amer 10/09/2017 >60  >60 mL/min Final   Comment: (NOTE) The eGFR has been calculated using the CKD EPI equation. This calculation has not been validated in all clinical situations. eGFR's persistently <60 mL/min signify possible Chronic Kidney Disease.   Georgiann Hahn gap 10/09/2017 6  5 - 15 Final   Performed at [facility], [address] [place], [place] [address]  . WBC 10/09/2017 10.9* 3.8 - 10.6 K/uL Final  . RBC 10/09/2017 2.94* 4.40 - 5.90 MIL/uL Final  . Hemoglobin 10/09/2017 8.9* 13.0 - 18.0 g/dL Final  . HCT 10/09/2017 26.3* 40.0 - 52.0 % Final  . MCV 10/09/2017 89.4  80.0 - 100.0 fL Final  . MCH 10/09/2017 30.3  26.0 - 34.0 pg Final  . MCHC 10/09/2017 33.9  32.0 - 36.0 g/dL Final  . RDW 10/09/2017 12.5  11.5 - 14.5 % Final  . Platelets 10/09/2017 170  150 - 440 K/uL Final   Performed at [facility], [address], [place], [place] [address]  . C Diff antigen 10/08/2017 NEGATIVE  NEGATIVE Final  . C Diff toxin 10/08/2017 NEGATIVE  NEGATIVE Final  . C Diff interpretation 10/08/2017 No C. difficile detected.    Final   Performed at [facility], [address] [place], [place] [address]  . aPTT 10/09/2017 48* 24 - 36 seconds Final   Comment:        IF BASELINE aPTT IS ELEVATED, SUGGEST PATIENT RISK ASSESSMENT BE USED TO DETERMINE APPROPRIATE ANTICOAGULANT THERAPY. Performed at [facility], [address] [place], [place] [address]   . Heparin Unfractionated 10/09/2017 0.40  0.30 - 0.70 IU/mL Final   Comment: (NOTE) If heparin results are below expected values, and patient dosage has  been confirmed, suggest follow up testing of antithrombin III levels. Performed at [facility], [address], [place], [place] [address]   . aPTT 10/09/2017 56* 24 - 36 seconds Final   Comment:        IF BASELINE aPTT IS ELEVATED, SUGGEST PATIENT RISK ASSESSMENT BE USED TO DETERMINE APPROPRIATE ANTICOAGULANT THERAPY. Performed at [facility], [address] [place], [place] [address]   . WBC 10/10/2017 12.5* 3.8 - 10.6 K/uL Final  . RBC 10/10/2017 2.85* 4.40 - 5.90 MIL/uL Final  . Hemoglobin 10/10/2017 8.6* 13.0 - 18.0 g/dL Final  . HCT 10/10/2017 25.3* 40.0 - 52.0 % Final  . MCV 10/10/2017 88.8  80.0 - 100.0 fL Final  . MCH 10/10/2017 30.2  26.0 - 34.0 pg Final  . MCHC 10/10/2017 34.0  32.0 - 36.0 g/dL Final  . RDW 10/10/2017 12.4  11.5 - 14.5 % Final  .  Platelets 10/10/2017 212  150 - 440 K/uL Final   Performed at [facility], [address] [place], [place] [address]  . Iron 10/10/2017 20* 45 - 182 ug/dL Final  . TIBC 10/10/2017 190* 250 - 450 ug/dL Final  . Saturation Ratios 10/10/2017 11* 17.9 - 39.5 % Final  . UIBC 10/10/2017 170  ug/dL Final   Performed at [facility], [address], [place], [place] [address]  . Ferritin 10/10/2017 421* 24 - 336 ng/mL Final   Performed at [facility], [address] [place], [place] [address]  . Vitamin B-12 10/10/2017 829  180 - 914 pg/mL Final   Comment: (NOTE) This assay  is not validated for testing neonatal or myeloproliferative syndrome specimens for Vitamin B12 levels. Performed at [facility], [address] [place], [place] [address]   . Folate 10/10/2017 10.8  >5.9 ng/mL Final   Performed at [facility], [address] [place], [place] [address]  . aPTT 10/10/2017 58* 24 - 36 seconds Final   Comment:        IF BASELINE aPTT IS ELEVATED, SUGGEST PATIENT RISK ASSESSMENT BE USED TO DETERMINE APPROPRIATE ANTICOAGULANT THERAPY. Performed at [facility], [address] [place], [place] [address]   . Heparin Unfractionated 10/10/2017 0.16* 0.30 - 0.70 IU/mL Final   Comment: (NOTE) If heparin results are below expected values, and patient dosage has  been confirmed, suggest follow up testing of antithrombin III levels. Performed at [facility], [address], [place], [place] [address]   . Vancomycin Tr 10/10/2017 8* 15 - 20 ug/mL Final   Performed at [facility], [address] [place], [place] [address]  . aPTT 10/10/2017 59* 24 - 36 seconds Final   Comment:        IF BASELINE aPTT IS ELEVATED, SUGGEST PATIENT RISK ASSESSMENT BE USED TO DETERMINE APPROPRIATE ANTICOAGULANT THERAPY. Performed at [facility], [address] [place], [place] [address]   . aPTT 10/10/2017 73* 24 - 36 seconds Final   Comment:        IF BASELINE aPTT IS ELEVATED, SUGGEST PATIENT RISK ASSESSMENT BE USED TO DETERMINE APPROPRIATE ANTICOAGULANT THERAPY. Performed at [facility], [address], [place], [place] [address]   . WBC 10/11/2017 14.0* 3.8 - 10.6 K/uL Final  . RBC 10/11/2017 2.73* 4.40 - 5.90 MIL/uL Final  . Hemoglobin 10/11/2017 8.7* 13.0 - 18.0 g/dL Final  . HCT 10/11/2017 24.0* 40.0 - 52.0 % Final  . MCV 10/11/2017 88.2  80.0 - 100.0 fL Final  . MCH 10/11/2017 31.9  26.0 - 34.0 pg Final  . MCHC 10/11/2017 36.2* 32.0 - 36.0 g/dL Final  . RDW 10/11/2017 12.6  11.5 - 14.5 %  Final  . Platelets 10/11/2017 289  150 - 440 K/uL Final   Performed at [facility], [address] [place], [place] [address]  . Sodium 10/11/2017 134* 135 - 145 mmol/L Final  . Potassium 10/11/2017 3.4* 3.5 - 5.1 mmol/L Final  . Chloride 10/11/2017 98  98 - 111 mmol/L Final  . CO2 10/11/2017 27  22 - 32 mmol/L Final  . Glucose, Bld 10/11/2017 103* 70 - 99 mg/dL Final  . BUN 10/11/2017 10  6 - 20 mg/dL Final  . Creatinine, Ser 10/11/2017 0.81  0.61 - 1.24 mg/dL Final  . Calcium 10/11/2017 8.2* 8.9 - 10.3 mg/dL Final  . Total Protein 10/11/2017 5.6* 6.5 - 8.1 g/dL Final  .  Albumin 10/11/2017 2.6* 3.5 - 5.0 g/dL Final  . AST 10/11/2017 16  15 - 41 U/L Final  . ALT 10/11/2017 21  0 - 44 U/L Final  . Alkaline Phosphatase 10/11/2017 49  38 - 126 U/L Final  . Total Bilirubin 10/11/2017 0.6  0.3 - 1.2 mg/dL Final  . GFR calc non Af Amer 10/11/2017 >60  >60 mL/min Final  . GFR calc Af Amer 10/11/2017 >60  >60 mL/min Final   Comment: (NOTE) The eGFR has been calculated using the CKD EPI equation. This calculation has not been validated in all clinical situations. eGFR's persistently <60 mL/min signify possible Chronic Kidney Disease.   Georgiann Hahn gap 10/11/2017 9  5 - 15 Final   Performed at [facility], [address] [place], [place] [address]  . Heparin Unfractionated 10/11/2017 0.27* 0.30 - 0.70 IU/mL Final   Comment: (NOTE) If heparin results are below expected values, and patient dosage has  been confirmed, suggest follow up testing of antithrombin III levels. Performed at [facility], [address] [place], [place] [address]   . aPTT 10/11/2017 96* 24 - 36 seconds Final   Comment:        IF BASELINE aPTT IS ELEVATED, SUGGEST PATIENT RISK ASSESSMENT BE USED TO DETERMINE APPROPRIATE ANTICOAGULANT THERAPY. Performed at [facility], [address], [place], [place] [address]   . Vancomycin Tr 10/11/2017 14* 15 - 20 ug/mL Final    Performed at [facility], [address] [place], [place] [address]  . Heparin Unfractionated 10/11/2017 0.26* 0.30 - 0.70 IU/mL Final   Comment: (NOTE) If heparin results are below expected values, and patient dosage has  been confirmed, suggest follow up testing of antithrombin III levels. Performed at [facility], [address] [place], [place] [address]   . Heparin Unfractionated 10/11/2017 0.50  0.30 - 0.70 IU/mL Final   Comment: (NOTE) If heparin results are below expected values, and patient dosage has  been confirmed, suggest follow up testing of antithrombin III levels. Performed at [facility], [address], [place], [place] [address]   . WBC 10/12/2017 10.7* 3.8 - 10.6 K/uL Final  . RBC 10/12/2017 2.95* 4.40 - 5.90 MIL/uL Final  . Hemoglobin 10/12/2017 9.1* 13.0 - 18.0 g/dL Final  . HCT 10/12/2017 26.2* 40.0 - 52.0 % Final  . MCV 10/12/2017 88.8  80.0 - 100.0 fL Final  . MCH 10/12/2017 30.8  26.0 - 34.0 pg Final  . MCHC 10/12/2017 34.7  32.0 - 36.0 g/dL Final  . RDW 10/12/2017 12.5  11.5 - 14.5 % Final  . Platelets 10/12/2017 448* 150 - 440 K/uL Final   Performed at [facility], [address], [place], [place] [address]  . Heparin Unfractionated 10/11/2017 0.43  0.30 - 0.70 IU/mL Final   Comment: (NOTE) If heparin results are below expected values, and patient dosage has  been confirmed, suggest follow up testing of antithrombin III levels. Performed at [facility], [address] [place], [place] [address]   . Heparin Unfractionated 10/12/2017 0.55  0.30 - 0.70 IU/mL Final   Comment: (NOTE) If heparin results are below expected values, and patient dosage has  been confirmed, suggest follow up testing of antithrombin III levels. Performed at [facility], [address], [place], [place] [address]   . Protein C Activity 10/12/2017 93  73 - 180 % Final   Comment: (NOTE) Performed At: [facility] [address]  [place], [place] [id number] Rush Farmer MD [id number]   . Protein C, Total 10/12/2017 78  60 - 150 % Final   Comment: (NOTE) Performed At: [facility] [place], [place] [id number] Rush Farmer MD [id number]   . Protein S Activity 10/12/2017 64  63 - 140 % Final   Comment: (NOTE) Protein S activity may be falsely increased (masking an abnormal, low result) in patients receiving direct Xa inhibitor (e.g., rivaroxaban, apixaban, edoxaban) or a direct thrombin inhibitor (e.g., dabigatran) anticoagulant treatment due to assay interference by these drugs. Performed At: [facility] [place], [place] [id number] Rush Farmer MD JI:[id number]   . Protein S Ag, Total 10/12/2017 79  60 - 150 % Final   Comment: (NOTE) This test was developed and its performance characteristics determined by [facility]. It has not been cleared or approved by the Food and Drug Administration. Performed At: [facility] [place], [place] [id number] Rush Farmer MD [id number]   . AntiThromb III Func 10/12/2017 54* 75 - 120 % Final   Performed at [facility] [address], [place], [place] [address]  . PTT Lupus Anticoagulant 10/12/2017 60.6* 0.0 - 51.9 sec Final   Comment: (NOTE) Additional testing confirms the presence of heparin in the test sample. Results obtained after heparin neutralization.   Marland Kitchen DRVVT 10/12/2017 36.8  0.0 - 47.0 sec Final  . Lupus Anticoag Interp 10/12/2017 Comment:   Corrected   Comment: (NOTE) No lupus anticoagulant was detected. Results suggest the presence of an inhibitor.  The presence of heparin, which is a non-specific inhibitor, may cause this pattern of results. Since the PTT-LA was extended and the dRVVT was within normal limits, a specific inhibitor to factor VIII, IX, XI, or XII cannot be excluded. Performed At: [facility] [place], [place] [id number] Rush Farmer MD [id number]   . Anticardiolipin IgG 10/12/2017 <9  0 - 14 GPL U/mL Final   Comment: (NOTE)                          Negative:              <15                          Indeterminate:     15 - 20                          Low-Med Positive: >20 - 80                          High Positive:         >80   . Anticardiolipin IgM 10/12/2017 12  0 - 12 MPL U/mL Final   Comment: (NOTE)                          Negative:              <13                          Indeterminate:     13 - 20                          Low-Med Positive: >20 -  80                          High Positive:         >80   . Anticardiolipin IgA 10/12/2017 <9  0 - 11 APL U/mL Final   Comment: (NOTE)                          Negative:              <12                          Indeterminate:     12 - 20                          Low-Med Positive: >20 - 80                          High Positive:         >80 Performed At: [facility] [place], [place] [id number] Rush Farmer MD [id number]   . Beta-2 Glyco I IgG 10/12/2017 <9  0 - 20 GPI IgG units Final   Comment: (NOTE) The reference interval reflects a 3SD or 99th percentile interval, which is thought to represent a potentially clinically significant result in accordance with the International Consensus Statement on the classification criteria for definitive antiphospholipid syndrome (APS). J Thromb Haem 2006;4:295-306.   . Beta-2-Glycoprotein I IgM 10/12/2017 <9  0 - 32 GPI IgM units Final   Comment: (NOTE) The reference interval reflects a 3SD or 99th percentile interval, which is thought to represent a potentially clinically significant result in accordance with the International Consensus Statement on the classification criteria for definitive antiphospholipid syndrome (APS). J Thromb Haem 2006;4:295-306. Performed At: [facility] [place], [place] [id number] Rush Farmer MD [id number]   . Beta-2-Glycoprotein I IgA 10/12/2017 <9  0 - 25 GPI IgA units Final   Comment: (NOTE) The reference interval reflects a 3SD or 99th percentile interval, which is thought to represent a potentially clinically significant result in accordance with the International Consensus Statement on the classification criteria for definitive antiphospholipid syndrome (APS). J Thromb Haem 2006;4:295-306.   Marland Kitchen Recommendations-F5LEID: 10/12/2017 Comment   Final   Comment: (NOTE) Result:  Negative (no mutation found) Factor V Leiden is a specific mutation (R506Q) in the factor V gene that is associated with an increased risk of venous thrombosis. Factor V Leiden is more resistant to inactivation by activated protein C.  As a result, factor V persists in the circulation leading to a mild hyper- coagulable state.  The Leiden mutation accounts for 90% - 95% of APC resistance.  Factor V Leiden has been reported in patients with deep vein thrombosis, pulmonary embolus, central retinal vein occlusion, cerebral sinus thrombosis and hepatic vein thrombosis. Other risk factors to be considered in the workup for venous thrombosis include the G20210A mutation in the factor II (prothrombin) gene, protein S and C deficiency, and antithrombin deficiencies. Anticardiolipin antibody and lupus anticoagulant analysis may be appropriate for certain patients, as well as homocysteine levels. Contact your local [facility] for information on how to order additi                          onal testing  if desired. **Genetic counselors are available for health care providers to**  discuss results at [phone] [phone]). Methodology: DNA analysis of the Factor V gene was performed by allele-specific PCR. The diagnostic sensitivity and specificity is >99% for both. Molecular-based testing is highly accurate, but as in any laboratory test, diagnostic errors may occur. All test results must be  combined with clinical information for the most accurate interpretation. This test was developed and its performance characteristics determined by [facility]. It has not been cleared or approved by the Food and Drug Administration. References: Voelkerding K (1996).  Clin Lab Med [phone]. Allison Quarry, PhD, [facility] Ruben Reason, PhD, [facility] Annetta Maw, M.S., PhD, [facility] Alfredo Bach, PhD, [facility] Norva Riffle, PhD, [facility] Earlean Polka PhD, [facility] Performed At: [facility] [address] [place], [place] [id number] Nechama Guard MD [id number]                          8   . Recommendations-PTGENE: 10/12/2017 Comment   Final   Comment: (NOTE) NEGATIVE No mutation identified. Comment: A point mutation (G20210A) in the factor II (prothrombin) gene is the second most common cause of inherited thrombophilia. The incidence of this mutation in the [place] Caucasian population is about 2% and in the [place] American population it is approximately 0.5%. This mutation is rare in the [place] and Native American population. Being heterozygous for a prothrombin mutation increases the risk for developing venous thrombosis about 2 to 3 times above the general population risk. Being homozygous for the prothrombin gene mutation increases the relative risk for venous thrombosis further, although it is not yet known how much further the risk is increased. In women heterozygous for the prothrombin gene mutation, the use of estrogen containing oral contraceptives increases the relative risk of venous thrombosis about 16 times and the risk of developing cerebral thrombosis is also significantly increased. In pregnancy the pr                          othrombin gene mutation increases risk for venous thrombosis and may increase risk for stillbirth, placental abruption, pre-eclampsia and fetal growth restriction. If the patient possesses two or more congenital or acquired thrombophilic  risk factors, the risk for thrombosis may rise to more than the sum of the risk ratios for the individual mutations. This assay detects only the prothrombin G20210A mutation and does not measure genetic abnormalities elsewhere in the genome. Other thrombotic risk factors may be pursued through systematic clinical laboratory analysis. These factors include the R506Q (Leiden) mutation in the Factor V gene, plasma homocysteine levels, as well as testing for deficiencies of antithrombin III, protein C and protein S. Genetic Counselors are available for health care providers to discuss results at [phone] [phone]). Methodology: DNA analysis of the Factor II gene was performed by PCR amplification followed by restriction analysis. The di                          agnostic sensitivity is >99% for both. All the tests must be combined with clinical information for the most accurate interpretation. Molecular-based testing is highly accurate, but as in any laboratory test, diagnostic errors may occur. This test was developed and its performance characteristics determined by [facility]. It has not been cleared or approved by the Food and Drug Administration. Poort SR, et al. Blood. 1996; 29:5284-1324. Varga EA. Circulation. 2004;  706:C37-S28. Mervin Hack, et Aragon; 19:700-703. Allison Quarry, PhD, [facility] Ruben Reason, PhD, [facility] Annetta Maw, M.S., PhD, [facility] Alfredo Bach, PhD, [facility] Norva Riffle, PhD, [facility] Earlean Polka, PhD, [facility] Performed At: [facility] [address] [place], [place] [id number] Nechama Guard MD [id number]   . Fibrinogen 10/12/2017 731* 210 - 475 mg/dL Final   Performed at [facility], [address] [place], [place] [address]  . Heparin Unfractionated 10/13/2017 0.53  0.30 - 0.70 IU/mL Final   Comment: (NOTE) If heparin results are below expected values, and patient dosage has  been confirmed,  suggest follow up testing of antithrombin III levels. Performed at [facility], [address] [place], [place] [address]   . Sodium 10/13/2017 140  135 - 145 mmol/L Final  . Potassium 10/13/2017 4.0  3.5 - 5.1 mmol/L Final  . Chloride 10/13/2017 104  98 - 111 mmol/L Final  . CO2 10/13/2017 28  22 - 32 mmol/L Final  . Glucose, Bld 10/13/2017 117* 70 - 99 mg/dL Final  . BUN 10/13/2017 8  6 - 20 mg/dL Final  . Creatinine, Ser 10/13/2017 0.85  0.61 - 1.24 mg/dL Final  . Calcium 10/13/2017 8.9  8.9 - 10.3 mg/dL Final  . GFR calc non Af Amer 10/13/2017 >60  >60 mL/min Final  . GFR calc Af Amer 10/13/2017 >60  >60 mL/min Final   Comment: (NOTE) The eGFR has been calculated using the CKD EPI equation. This calculation has not been validated in all clinical situations. eGFR's persistently <60 mL/min signify possible Chronic Kidney Disease.   Georgiann Hahn gap 10/13/2017 8  5 - 15 Final   Performed at [facility], [address] [place], [place] [address]  . Heparin Unfractionated 10/13/2017 0.42  0.30 - 0.70 IU/mL Final   Comment: (NOTE) If heparin results are below expected values, and patient dosage has  been confirmed, suggest follow up testing of antithrombin III levels. Performed at [facility], [address], [place], [place] [address]   . WBC 10/14/2017 10.0  3.8 - 10.6 K/uL Final  . RBC 10/14/2017 2.63* 4.40 - 5.90 MIL/uL Final  . Hemoglobin 10/14/2017 8.2* 13.0 - 18.0 g/dL Final  . HCT 10/14/2017 23.5* 40.0 - 52.0 % Final  . MCV 10/14/2017 89.4  80.0 - 100.0 fL Final  . MCH 10/14/2017 31.3  26.0 - 34.0 pg Final  . MCHC 10/14/2017 35.0  32.0 - 36.0 g/dL Final  . RDW 10/14/2017 12.6  11.5 - 14.5 % Final  . Platelets 10/14/2017 646* 150 - 440 K/uL Final   Performed at [facility], [address] [place], [place] [address]  . Sodium 10/14/2017 141  135 - 145 mmol/L Final  . Potassium 10/14/2017 3.5  3.5 - 5.1 mmol/L Final  . Chloride  10/14/2017 105  98 - 111 mmol/L Final  . CO2 10/14/2017 28  22 - 32 mmol/L Final  . Glucose, Bld 10/14/2017 133* 70 - 99 mg/dL Final  . BUN 10/14/2017 8  6 - 20 mg/dL Final  . Creatinine, Ser 10/14/2017 0.70  0.61 - 1.24 mg/dL Final  . Calcium 10/14/2017 8.4* 8.9 - 10.3 mg/dL Final  . GFR calc non Af Amer 10/14/2017 >60  >60 mL/min Final  . GFR calc Af Amer 10/14/2017 >60  >60 mL/min Final   Comment: (NOTE) The eGFR has been calculated using the CKD EPI equation. This calculation has not been validated in  all clinical situations. eGFR's persistently <60 mL/min signify possible Chronic Kidney Disease.   Georgiann Hahn gap 10/14/2017 8  5 - 15 Final   Performed at [facility], [address] [place], [place] [address]  . Heparin Unfractionated 10/14/2017 0.64  0.30 - 0.70 IU/mL Final   Comment: (NOTE) If heparin results are below expected values, and patient dosage has  been confirmed, suggest follow up testing of antithrombin III levels. Performed at [facility], [address] [place], [place] [address]   . Vancomycin Tr 10/14/2017 17  15 - 20 ug/mL Final   Performed at [facility], [address] [place], [place] [address]  . PTT-LA Mix 10/12/2017 54.3* 0.0 - 48.9 sec Final   Comment: (NOTE) Performed At: [facility] [address] [place], [place] [id number] Rush Farmer MD [id number]   . Hexagonal Phase Phospholipid 10/12/2017 9  0 - 11 sec Final   Comment: (NOTE) Performed At: [facility] [place], [place] [id number] Rush Farmer MD [id number]   . Magnesium 10/14/2017 2.0  1.7 - 2.4 mg/dL Final   Performed at [facility], [address] [place], [place] [address]  . Heparin Unfractionated 10/15/2017 0.70  0.30 - 0.70 IU/mL Final   Comment: (NOTE) If heparin results are below expected values, and patient dosage has  been confirmed, suggest follow up testing of antithrombin III  levels. Performed at [facility], [address] [place] [address] [place], [place] [address]   . WBC 10/15/2017 7.4  3.8 - 10.6 K/uL Final  . RBC 10/15/2017 2.68* 4.40 - 5.90 MIL/uL Final  . Hemoglobin 10/15/2017 8.1* 13.0 - 18.0 g/dL Final  . HCT 10/15/2017 24.4* 40.0 - 52.0 % Final  . MCV 10/15/2017 91.0  80.0 - 100.0 fL Final  . MCH 10/15/2017 30.1  26.0 - 34.0 pg Final  . MCHC 10/15/2017 33.1  32.0 - 36.0 g/dL Final  . RDW 10/15/2017 13.0  11.5 - 14.5 % Final  . Platelets 10/15/2017 696* 150 - 440 K/uL Final   Performed at [facility], [address] [place], [place] [address]  . Sodium 10/16/2017 141  135 - 145 mmol/L Final  . Potassium 10/16/2017 3.9  3.5 - 5.1 mmol/L Final  . Chloride 10/16/2017 104  98 - 111 mmol/L Final  . CO2 10/16/2017 29  22 - 32 mmol/L Final  . Glucose, Bld 10/16/2017 97  70 - 99 mg/dL Final  . BUN 10/16/2017 10  6 - 20 mg/dL Final  . Creatinine, Ser 10/16/2017 0.75  0.61 - 1.24 mg/dL Final  . Calcium 10/16/2017 8.9  8.9 - 10.3 mg/dL Final  . GFR calc non Af Amer 10/16/2017 >60  >60 mL/min Final  . GFR calc Af Amer 10/16/2017 >60  >60 mL/min Final   Comment: (NOTE) The eGFR has been calculated using the CKD EPI equation. This calculation has not been validated in all clinical situations. eGFR's persistently <60 mL/min signify possible Chronic Kidney Disease.   Georgiann Hahn gap 10/16/2017 8  5 - 15 Final   Performed at [facility], [address] [place], [place] [address]    Assessment:  SHREYANSH TIFFANY is a 27 y.o. male with recurrent testicular cancer.  He presented with a left lower extremity DVT on 09/10/2017.  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  Clinical/pathologic stage is T1 N1-2 S0 M0.  He received 3  cycles of  BEP (12/30/2015 - 02/24/2016).  He received OnPro Neulasta with cycle #2 and #3.  He declined evaluation for retroperitoneal lymph node dissection (RPLND).  He declined sperm banking.  Pre surgery labs on 11/20/2015 revealed an AFP of 411.9, beta-HCG 2,669, and LDH 522 (121-224).    Tumor markers have been followed:   AFP was 411.9 (0-8.3) on 11/20/2015, 11.2 on 12/19/2015, 6.3 on 12/23/2015, 1.3 on 01/27/2016, 1.8 on 02/17/2016, 1.9 on 03/30/2016, 1.4 on 05/27/2016, 1.0 on 07/29/2016, 1.4 on 10/14/2016, 3.3 on 12/24/2016, 1 on 09/10/2017, and 1 on 09/20/2017.    Beta-HCG was 2,669 (0-3) on 11/20/2015, 2 on 12/19/2015, 1 on 12/23/2015, < 1 on 01/27/2016, < 1 on 02/17/2016, < 1 on 03/30/2016, < 1 on 05/27/2016,  < 1 on 07/29/2016, < 1 on 10/14/2016, and 3 on 12/24/2016, < 5 on 09/11/2017, and < 5 on 09/20/2017.  LDH was 522 (98-192) on 11/20/2015, 179 on 12/19/2015, 160 on 12/23/2015, 202 on 01/27/2016, 156 on 02/10/2016, 161 on 02/17/2016, 172 on 03/30/2016, 178 on 05/27/2016, 150 on 07/29/2016, 152 on 10/14/2016, 145 on 12/24/2016, 1557 on 09/11/2017, and 916 on 09/20/2017.  Chest, abdomen, and pelvic CT on 09/11/2017 revealed enlarged and morphologically abnormal 4.3 x 4.9 cm left pelvic sidewall and retroperitoneal lymph nodes (measuring up to 2.9 cm).  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  There was anasarca/swelling of the left lower extremity.  There was no evidence of metastatic disease in the chest.  Head MRI on 09/13/2017 revealed no evidence of metastatic disease.  He has a left lower extremity DVT.  Bilateral lower extremity duplex on 09/10/2017 revealed evidence of obstruction in the left CFV, SFJ, and proximal femoral vein.  He is on Xarelto.  He is currently day 31 s/p cycle #1 TIP chemotherapy (began 09/21/2017) at [facility] with [place] support.  He was diagnosed with cellulitis of the left lower  extremity on 10/06/2017.  He was on clindamycin.  While hospitalized (10/08/2017 - 10/16/2017), he received 9 days of vancomycin and Cefepime.  He was discharged on Flagyl, doxycycline, and Levaquin.  He was admitted to [facility] from 10/08/2017 - 10/16/2017.  He was treated with Cefepime and vancomycin.  Imaging revealed significant improvement in adenopathy with decreased pelvic sidewall disease and decreased bulk at aortic bifurcation.  He was felt to have venous congestion syndrome with skin lesions due to congestion.   He underwent IVC filterplacement, percutaneous angioplastyand stent placementof the left common iliac vein and left external iliac vein on 10/13/2017.  He was discharged on Flagyl, doxycycline, and Levaquin.  Hypercoagulable work-up on 10/12/2017 negative for the following: Factor V Leiden, prothrombin gene mutation, lupus anticoagulant, anticardiolipin antibodies, beta-2 glycoprotein, protein C activity/antigen, protein S activity/antigen. ATIII was 54% (75-120) on heparin.  Symptomatically, he is feeling better.  His left leg is minimally edematous.  Skin lesions are healing.  Plan: 1. Labs today:  CBC with diff, CMP, Mg, Phos, LDH, AFP, beta-HCG, urinlaysis. 2. Admit to hospital for cycle #2 TIP chemotherapy - treatment ongoing  Paclitaxel 250 mg/m2 CI over 24 hours on Day 1  [place] 500 mg/m2 over 15 minutes before ifosfamide, then at 4 hours and 8 hours from the start of each ifosfamide dose on day 2-5.  Ifosfamide 1500 mg/m2 over 3 hours daily on days 2-5.  Cisplatin 25  mg/m2 over 60 minutes on days 2-5.  Hydration pre and post ifosfamide and cisplatin.  Premed for Taxol:  Famotidine, Benadryl, Decadron  Daily lab check:  CBC with diff, CMP, Mg, phosphorus, urinalysis.  Anticipate Neulasta/Udencya in outpatient department post chemotherapy. 3. Review chemotherapy plan:  4 cycles of TIP every 3 weeks followed by re-evaluation and possible RPLD based on residual  disease. Patient consents to proceeding with cycle #2 chemotherapy today. 4. DVT - improved  Continue Xarelto as previously   Schedule follow-up with Dr. Delana Meyer.  Anticipate removal of IVC filter.  Continue wound care. 5. Cellulitis:  Follow-up with Dr. Tressia Miners and [facility] regarding continued antibiotics. 6.   Anticipate audiogram prior to cycle #3 TIP. 7.   Electrolyte wasting.  Continue home potassium and magnesium. 8.   RTC on 10/26/2017 for MD assessment, labs (CBC with diff, BMP, Mg) and Udencya/Neulasta.   Honor Loh, NP  10/21/2017, 9:07 AM   I saw and evaluated the patient, participating in the key portions of the service and reviewing pertinent diagnostic studies and records.  I reviewed the nurse practitioner's note and agree with the findings and the plan.  The assessment and plan were discussed with the patient and his mother in detail.  Multiple questions were asked by the patient and his mother and answered.  He is agreeable to admission.   Nolon Stalls, MD 10/21/2017,9:07 AM

## 2017-10-21 NOTE — Telephone Encounter (Signed)
Patient being admitted for 5 days for in house chemotherapy. Patient going to room # 122.  Called report to nurse, Cope.

## 2017-10-21 NOTE — H&P (Signed)
[facility] [place] NAME: Edgar [name]    MR#:  [id number]  DATE OF BIRTH:  08/17/1990  DATE OF ADMISSION:  10/21/2017  PRIMARY CARE PHYSICIAN: Patient, No Pcp Per   REQUESTING/REFERRING PHYSICIAN: [place]  CHIEF COMPLAINT:  No chief complaint on file.   HISTORY OF PRESENT ILLNESS: Edgar [name]  is a 27 y.o. male with a known history of testicular cancer, gastroesophageal reflux disease, deep vein thrombosis-was admitted to hospital for cellulitis and after stay for more than a week, discharged 5 days ago.  Feeling much better.  Now plan is to start on chemotherapy. Oncologist called in for direct admission as they are planning to do 5 days IV chemotherapy regimen. Patient has no complaints.  PAST MEDICAL HISTORY:   Past Medical History:  Diagnosis Date  . Asthma    AS A CHILD-NO INHALERS  . Cancer ([place])    testicular cancer 11/2016 per pt   . GERD (gastroesophageal reflux disease)    TUMS PRN    PAST SURGICAL HISTORY:  Past Surgical History:  Procedure Laterality Date  . ANKLE FRACTURE SURGERY Right 2004  . ORCHIECTOMY Left 11/26/2015   Procedure: ORCHIECTOMY;  Surgeon: [name], MD;  Location: [facility];  Service: Urology;  Laterality: Left;  radical/Inguinal approach  . PERIPHERAL VASCULAR CATHETERIZATION N/A 12/16/2015   Procedure: [name] Cath Insertion;  Surgeon: [name], MD;  Location: [facility];  Service: Cardiovascular;  Laterality: N/A;  . PERIPHERAL VASCULAR THROMBECTOMY Left 10/13/2017   Procedure: PERIPHERAL VASCULAR THROMBECTOMY;  Surgeon: [name], MD;  Location: [facility];  Service: Cardiovascular;  Laterality: Left;  . WISDOM TOOTH EXTRACTION  2017    SOCIAL HISTORY:  Social History   Tobacco Use  . Smoking status: Current Every Day Smoker    Packs/day: 0.50    Years: 8.00    Pack years: 4.00    Types: Cigarettes  . Smokeless tobacco: Former Systems developer    Types: Snuff   Substance Use Topics  . Alcohol use: No    FAMILY HISTORY:  Family History  Problem Relation Age of Onset  . Skin cancer Mother   . Breast cancer Maternal Grandmother   . Colon cancer Maternal Grandfather   . Diabetes Maternal Grandfather   . Emphysema Father   . Mental illness Sister        anxiety  . Stroke Paternal Grandmother   . Prostate cancer Neg Hx   . Kidney cancer Neg Hx   . Bladder Cancer Neg Hx     DRUG ALLERGIES: No Known Allergies  REVIEW OF SYSTEMS:   CONSTITUTIONAL: No fever, fatigue or weakness.  EYES: No blurred or double vision.  EARS, NOSE, AND THROAT: No tinnitus or ear pain.  RESPIRATORY: No cough, shortness of breath, wheezing or hemoptysis.  CARDIOVASCULAR: No chest pain, orthopnea, edema.  GASTROINTESTINAL: No nausea, vomiting, diarrhea or abdominal pain.  GENITOURINARY: No dysuria, hematuria.  ENDOCRINE: No polyuria, nocturia,  HEMATOLOGY: No anemia, easy bruising or bleeding SKIN: No rash or lesion. MUSCULOSKELETAL: No joint pain or arthritis.   NEUROLOGIC: No tingling, numbness, weakness.  PSYCHIATRY: No anxiety or depression.   MEDICATIONS AT HOME:  Prior to Admission medications   Medication Sig Start Date End Date Taking? Authorizing Provider  acetaminophen (TYLENOL) 500 MG tablet Take 500-1,000 mg by mouth every 6 (six) hours as needed for mild pain or fever.    Yes [provider]  dexamethasone (DECADRON) 4  MG tablet Take 4 mg by mouth daily. 09/26/17 09/26/18 Yes [provider]  doxycycline (VIBRA-TABS) 100 MG tablet Take 1 tablet (100 mg total) by mouth 2 (two) times daily for 10 days. 10/16/17 10/26/17 Yes Gladstone Lighter, MD  DULoxetine (CYMBALTA) 60 MG capsule Take 1 tablet by mouth daily. 10/05/17 10/05/18 Yes [provider]  gabapentin (NEURONTIN) 300 MG capsule Take 1 capsule (300 mg total) by mouth at bedtime. 10/16/17  Yes Gladstone Lighter, MD  levofloxacin (LEVAQUIN) 500 MG tablet Take 1 tablet (500  mg total) by mouth daily for 10 days. 10/16/17 10/26/17 Yes Gladstone Lighter, MD  magnesium oxide (MAG-OX) 400 MG tablet Take 1 tablet by mouth daily. 10/04/17  Yes [provider]  metroNIDAZOLE (FLAGYL) 500 MG tablet Take 1 tablet (500 mg total) by mouth 3 (three) times daily for 10 days. 10/16/17 10/26/17 Yes Gladstone Lighter, MD  nicotine (NICODERM CQ - DOSED IN MG/24 HR) 7 mg/24hr patch Place 7 mg onto the skin daily.  09/15/17  Yes [provider]  ondansetron (ZOFRAN-ODT) 4 MG disintegrating tablet Take 2 tablets by mouth every 8 (eight) hours as needed. 09/26/17  Yes [provider]  oxyCODONE (OXYCONTIN) 10 mg 12 hr tablet Take 1 tablet (10 mg total) by mouth every 12 (twelve) hours for 7 days. 10/16/17 10/23/17 Yes Gladstone Lighter, MD  Oxycodone HCl 10 MG TABS Take 1 tablet (10 mg total) by mouth every 6 (six) hours as needed (moderate and severe pain). 10/16/17  Yes Gladstone Lighter, MD  polyethylene glycol (MIRALAX / GLYCOLAX) packet Take 17 g by mouth daily as needed for mild constipation. 10/16/17 11/15/17 Yes Gladstone Lighter, MD  potassium chloride (KLOR-CON) 20 MEQ packet Take 20 mEq by mouth 2 (two) times daily.    Yes [provider]  prochlorperazine (COMPAZINE) 10 MG tablet Take 1 tablet by mouth every 6 (six) hours as needed (nausea/vomiting).  09/26/17  Yes [provider]  silver sulfADIAZINE (SILVADENE) 1 % cream Apply topically 2 (two) times daily. Apply to blisters on left thigh and cover with nonadherent dressing 10/16/17  Yes Gladstone Lighter, MD  XARELTO 20 MG TABS tablet Take 1 tablet by mouth daily. 09/26/17  Yes [provider]      PHYSICAL EXAMINATION:   VITAL SIGNS: Blood pressure 122/89, pulse 94, temperature 97.9 F (36.6 C), temperature source Oral, resp. rate 20, height 6\' 3"  (1.905 m), weight 116 kg, SpO2 99 %.  GENERAL:  27 y.o.-year-old patient lying in the bed with no acute distress.  EYES: Pupils equal,  round, reactive to light and accommodation. No scleral icterus. Extraocular muscles intact.  HEENT: Head atraumatic, normocephalic. Oropharynx and nasopharynx clear.  NECK:  Supple, no jugular venous distention. No thyroid enlargement, no tenderness.  LUNGS: Normal breath sounds bilaterally, no wheezing, rales,rhonchi or crepitation. No use of accessory muscles of respiration.  CARDIOVASCULAR: S1, S2 normal. No murmurs, rubs, or gallops.  ABDOMEN: Soft, nontender, nondistended. Bowel sounds present. No organomegaly or mass.  EXTREMITIES: No pedal edema, cyanosis, or clubbing. No redness. NEUROLOGIC: Cranial nerves II through XII are intact. Muscle strength 5/5 in all extremities. Sensation intact. Gait not checked.  PSYCHIATRIC: The patient is alert and oriented x 3.  SKIN: No obvious rash, lesion, or ulcer.   LABORATORY PANEL:   CBC Recent Labs  Lab 10/15/17 0514 10/21/17 1639  WBC 7.4 7.5  HGB 8.1* 10.2*  HCT 24.4* 29.7*  PLT 696* 701*  MCV 91.0 92.0  MCH 30.1 31.7  MCHC  33.1 34.4  RDW 13.0 15.7*  LYMPHSABS  --  1.4  MONOABS  --  1.2*  EOSABS  --  0.0  BASOSABS  --  0.1   ------------------------------------------------------------------------------------------------------------------  Chemistries  Recent Labs  Lab 10/16/17 0440 10/21/17 0949 10/21/17 1639  NA 141 137  --   K 3.9 4.4  --   CL 104 103  --   CO2 29 24  --   GLUCOSE 97 100*  --   BUN 10 12  --   CREATININE 0.75 0.84  --   CALCIUM 8.9 9.7  --   MG  --  2.0 2.0  AST  --  23  --   ALT  --  23  --   ALKPHOS  --  54  --   BILITOT  --  0.3  --    ------------------------------------------------------------------------------------------------------------------ estimated creatinine clearance is 178.2 mL/min (by C-G formula based on SCr of 0.84 mg/dL). ------------------------------------------------------------------------------------------------------------------ No results for input(s): TSH, T4TOTAL,  T3FREE, THYROIDAB in the last 72 hours.  Invalid input(s): FREET3   Coagulation profile No results for input(s): INR, PROTIME in the last 168 hours. ------------------------------------------------------------------------------------------------------------------- No results for input(s): DDIMER in the last 72 hours. -------------------------------------------------------------------------------------------------------------------  Cardiac Enzymes No results for input(s): CKMB, TROPONINI, MYOGLOBIN in the last 168 hours.  Invalid input(s): CK ------------------------------------------------------------------------------------------------------------------ Invalid input(s): POCBNP  ---------------------------------------------------------------------------------------------------------------  Urinalysis    Component Value Date/Time   COLORURINE YELLOW (A) 10/21/2017 0949   APPEARANCEUR HAZY (A) 10/21/2017 0949   APPEARANCEUR Clear 11/20/2015 1331   LABSPEC 1.018 10/21/2017 0949   PHURINE 5.0 10/21/2017 0949   GLUCOSEU NEGATIVE 10/21/2017 0949   HGBUR NEGATIVE 10/21/2017 0949   BILIRUBINUR NEGATIVE 10/21/2017 0949   BILIRUBINUR Negative 11/20/2015 1331   KETONESUR NEGATIVE 10/21/2017 0949   PROTEINUR NEGATIVE 10/21/2017 0949   NITRITE NEGATIVE 10/21/2017 0949   LEUKOCYTESUR TRACE (A) 10/21/2017 0949   LEUKOCYTESUR Negative 11/20/2015 1331     RADIOLOGY: No results found.  EKG: Orders placed or performed in visit on 02/27/07  . EKG 12-Lead    IMPRESSION AND PLAN:  *Testicular cancer Chemotherapy as per oncologist.  *DVT Continue anticoagulation.  *Recent cellulitis As per last discharge, he was supposed to continue antibiotics until 13 August, continue the same in hospital.  * Smoking Counseled to quit smoking for 40 minutes and often regarding patch.  All the records are reviewed and case discussed with ED provider. Management plans discussed with the  patient, family and they are in agreement.  CODE STATUS: Full.    Code Status Orders  (From admission, onward)         Start     Ordered   10/21/17 1111  Full code  Continuous     10/21/17 1110        Code Status History    Date Active Date Inactive Code Status Order ID Comments User Context   10/08/2017 1621 10/16/2017 1836 Full Code [id number]  Vaughan Basta, MD Inpatient       TOTAL TIME TAKING CARE OF THIS PATIENT: 45 minutes.    Vaughan Basta M.D on 10/21/2017   Between 7am to 6pm - Pager - [phone]  After 6pm go to [email or web address] - password [email or web address] [facility]  Office  [phone]  CC: Primary care physician; Patient, No Pcp Per   Note: This dictation was prepared with Dragon dictation along with smaller phrase technology. Any transcriptional errors that result from this process are unintentional.

## 2017-10-21 NOTE — Progress Notes (Signed)
Patient here today for initiation of inpatient chemo.  States he is having pain 4/10 in his left leg.  Some SOB at times,  Overall much better than when he was seen several weeks ago.
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
## 2017-10-22 ENCOUNTER — Other Ambulatory Visit: Payer: Self-pay | Admitting: Hematology and Oncology

## 2017-10-22 DIAGNOSIS — C6290 Malignant neoplasm of unspecified testis, unspecified whether descended or undescended: Secondary | ICD-10-CM

## 2017-10-22 DIAGNOSIS — Z959 Presence of cardiac and vascular implant and graft, unspecified: Secondary | ICD-10-CM

## 2017-10-22 DIAGNOSIS — Z7901 Long term (current) use of anticoagulants: Secondary | ICD-10-CM

## 2017-10-22 DIAGNOSIS — Z5111 Encounter for antineoplastic chemotherapy: Principal | ICD-10-CM

## 2017-10-22 DIAGNOSIS — L03116 Cellulitis of left lower limb: Secondary | ICD-10-CM

## 2017-10-22 DIAGNOSIS — I82402 Acute embolism and thrombosis of unspecified deep veins of left lower extremity: Secondary | ICD-10-CM

## 2017-10-22 LAB — BASIC METABOLIC PANEL
Anion gap: 4 — ABNORMAL LOW (ref 5–15)
BUN: 9 mg/dL (ref 6–20)
CALCIUM: 6.6 mg/dL — AB (ref 8.9–10.3)
CO2: 20 mmol/L — ABNORMAL LOW (ref 22–32)
CREATININE: 0.61 mg/dL (ref 0.61–1.24)
Chloride: 114 mmol/L — ABNORMAL HIGH (ref 98–111)
GFR calc non Af Amer: >60 mL/min (ref >60)
Glucose, Bld: 107 mg/dL — ABNORMAL HIGH (ref 70–99)
Potassium: 3.4 mmol/L — ABNORMAL LOW (ref 3.5–5.1)
SODIUM: 138 mmol/L (ref 135–145)

## 2017-10-22 LAB — AFP TUMOR MARKER: AFP, SERUM, TUMOR MARKER: 2.6 ng/mL (ref 0.0–8.3)

## 2017-10-22 LAB — CBC
HCT: 28.8 % — ABNORMAL LOW (ref 40.0–52.0)
Hemoglobin: 10.3 g/dL — ABNORMAL LOW (ref 13.0–18.0)
MCH: 32.7 pg (ref 26.0–34.0)
MCHC: 35.6 g/dL (ref 32.0–36.0)
MCV: 92.1 fL (ref 80.0–100.0)
PLATELETS: 673 10*3/uL — AB (ref 150–440)
RBC: 3.13 MIL/uL — AB (ref 4.40–5.90)
RDW: 15.5 % — AB (ref 11.5–14.5)
WBC: 7.6 10*3/uL (ref 3.8–10.6)

## 2017-10-22 LAB — CALCIUM: Calcium: 9.4 mg/dL (ref 8.9–10.3)

## 2017-10-22 LAB — BETA HCG QUANT (REF LAB): hCG Quant: <1 m[IU]/mL (ref 0–3)

## 2017-10-22 MED ORDER — PALONOSETRON HCL INJECTION 0.25 MG/5ML
0.2500 mg | Freq: Once | INTRAVENOUS | Status: AC
  Administered 2017-10-23: 01:00:00 0.25 mg via INTRAVENOUS
  Filled 2017-10-22: qty 5

## 2017-10-22 MED ORDER — DEXTROSE-NACL 5-0.45 % IV SOLN
Freq: Once | INTRAVENOUS | Status: AC
  Administered 2017-10-22: 23:00:00 via INTRAVENOUS
  Filled 2017-10-22: qty 10

## 2017-10-22 MED ORDER — SODIUM CHLORIDE 0.9 % IV SOLN
Freq: Once | INTRAVENOUS | Status: AC
  Administered 2017-10-23: 01:00:00 via INTRAVENOUS
  Filled 2017-10-22 (×2): qty 5

## 2017-10-22 MED ORDER — SODIUM CHLORIDE 0.9 % IV SOLN
500.0000 mg/m2 | INTRAVENOUS | Status: AC
  Administered 2017-10-23 (×2): 1250 mg via INTRAVENOUS
  Filled 2017-10-22 (×2): qty 12.5

## 2017-10-22 MED ORDER — MESNA 100 MG/ML IV SOLN
500.0000 mg/m2 | Freq: Once | INTRAVENOUS | Status: AC
  Administered 2017-10-23: 01:00:00 1250 mg via INTRAVENOUS
  Filled 2017-10-22 (×2): qty 12.5

## 2017-10-22 MED ORDER — SODIUM CHLORIDE 0.9 % IV SOLN
25.0000 mg/m2 | Freq: Once | INTRAVENOUS | Status: AC
  Administered 2017-10-23: 64 mg via INTRAVENOUS
  Filled 2017-10-22: qty 64

## 2017-10-22 MED ORDER — DOXYCYCLINE HYCLATE 100 MG PO TABS
100.0000 mg | ORAL_TABLET | Freq: Two times a day (BID) | ORAL | Status: DC
  Administered 2017-10-22 – 2017-10-26 (×8): 100 mg via ORAL
  Filled 2017-10-22 (×9): qty 1

## 2017-10-22 MED ORDER — SODIUM CHLORIDE 0.9 % IV SOLN
Freq: Once | INTRAVENOUS | Status: AC
  Administered 2017-10-23: 02:00:00 via INTRAVENOUS
  Filled 2017-10-22: qty 76

## 2017-10-22 NOTE — Progress Notes (Signed)
[facility] at [place] NAME: Edgar [name]    MR#:  [id number]  DATE OF BIRTH:  [birth year]  SUBJECTIVE:  CHIEF COMPLAINT:  No chief complaint on file. sent from cancer center for iv chemotherapy. No complains.  REVIEW OF SYSTEMS:  CONSTITUTIONAL: No fever, fatigue or weakness.  EYES: No blurred or double vision.  EARS, NOSE, AND THROAT: No tinnitus or ear pain.  RESPIRATORY: No cough, shortness of breath, wheezing or hemoptysis.  CARDIOVASCULAR: No chest pain, orthopnea, edema.  GASTROINTESTINAL: No nausea, vomiting, diarrhea or abdominal pain.  GENITOURINARY: No dysuria, hematuria.  ENDOCRINE: No polyuria, nocturia,  HEMATOLOGY: No anemia, easy bruising or bleeding SKIN: No rash or lesion. MUSCULOSKELETAL: No joint pain or arthritis.   NEUROLOGIC: No tingling, numbness, weakness.  PSYCHIATRY: No anxiety or depression.   ROS  DRUG ALLERGIES:  No Known Allergies  VITALS:  Blood pressure (!) 132/94, pulse 84, temperature 98.2 F (36.8 C), temperature source Oral, resp. rate 20, height 6\' 3"  (1.905 m), weight 116 kg, SpO2 96 %.  PHYSICAL EXAMINATION:  GENERAL:  27 y.o.-year-old patient lying in the bed with no acute distress.  EYES: Pupils equal, round, reactive to light and accommodation. No scleral icterus. Extraocular muscles intact.  HEENT: Head atraumatic, normocephalic. Oropharynx and nasopharynx clear.  NECK:  Supple, no jugular venous distention. No thyroid enlargement, no tenderness.  LUNGS: Normal breath sounds bilaterally, no wheezing, rales,rhonchi or crepitation. No use of accessory muscles of respiration.  CARDIOVASCULAR: S1, S2 normal. No murmurs, rubs, or gallops.  ABDOMEN: Soft, nontender, nondistended. Bowel sounds present. No organomegaly or mass.  EXTREMITIES: No pedal edema, cyanosis, or clubbing.  NEUROLOGIC: Cranial nerves II through XII are intact. Muscle strength 5/5 in all extremities. Sensation intact. Gait  not checked.  PSYCHIATRIC: The patient is alert and oriented x 3.  SKIN: No obvious rash, lesion, or ulcer.   Physical Exam LABORATORY PANEL:   CBC Recent Labs  Lab 10/22/17 0514  WBC 7.6  HGB 10.3*  HCT 28.8*  PLT 673*   ------------------------------------------------------------------------------------------------------------------  Chemistries  Recent Labs  Lab 10/21/17 0949 10/21/17 1639 10/22/17 0514 10/22/17 1201  NA 137  --  138  --   K 4.4  --  3.4*  --   CL 103  --  114*  --   CO2 24  --  20*  --   GLUCOSE 100*  --  107*  --   BUN 12  --  9  --   CREATININE 0.84  --  0.61  --   CALCIUM 9.7  --  6.6* 9.4  MG 2.0 2.0  --   --   AST 23  --   --   --   ALT 23  --   --   --   ALKPHOS 54  --   --   --   BILITOT 0.3  --   --   --    ------------------------------------------------------------------------------------------------------------------  Cardiac Enzymes No results for input(s): TROPONINI in the last 168 hours. ------------------------------------------------------------------------------------------------------------------  RADIOLOGY:  No results found.  ASSESSMENT AND PLAN:   Active Problems:   Scrotal cancer [facility])  *Testicular cancer Chemotherapy as per oncologist.  *DVT Continue anticoagulation.  *Recent cellulitis As per last discharge, he was supposed to continue antibiotics until 13 August, Pt received 10 days Abx in hospital, and 7 days since then. His cellulitis resolved. He did not have bacteremia and cx were negative. I wills top Abx now.  *  Smoking Counseled to quit smoking for 4 minutes and offered nicotine patch.   All the records are reviewed and case discussed with Care Management/Social Workerr. Management plans discussed with the patient, family and they are in agreement.  CODE STATUS: full.  TOTAL TIME TAKING CARE OF THIS PATIENT: 35 minutes.   POSSIBLE D/C IN 1-2 DAYS, DEPENDING ON CLINICAL  CONDITION.   Vaughan Basta M.D on 10/22/2017   Between 7am to 6pm - Pager - [phone]  After 6pm go to [email or web address] - password [email or web address] [facility]  Office  [phone]  CC: Primary care physician; Patient, No Pcp Per  Note: This dictation was prepared with Dragon dictation along with smaller phrase technology. Any transcriptional errors that result from this process are unintentional.

## 2017-10-22 NOTE — Consult Note (Signed)
[facility]  Date of admission:  10/21/2017  Inpatient day:  10/22/2017  Consulting physician:  Vaughan Basta.  Reason for Consultation:  Chemotherapy.  Chief Complaint: Edgar [name] is a 27 y.o. male with recurrent testicular cancer who was admitted through the medical oncology clinic for cycle #2 TIP chemotherapy.  HPI:  The patient was diagnosed with stage IIB testicular cancer in 11/26/2015.  He received 3 cycles of BEP chemotherapy.  He recurred in 08/2017.  He presented with a left lower extremity DVT.  He was started on Xarelto,  He received cycle #1 TIP chemotherapy at [facility] with [place] support.  He was diagnosed with a left lower extremity cellulitis on 10/06/2017.  He failed outpatient clindamycin.  He was admitted to [facility] from  from 10/08/2017 - 10/16/2017 for cellulitis and a painful swollen left leg.  He was unable to ambulate. He developed 6 necrotic skin lesions.  He was treated with Cefepime and vancomycin. Imaging revealedsignificant improvement in adenopathy with decreased pelvic sidewall disease and decreased bulk at aortic bifurcation. He was felt tohave venous congestion syndrome with skin lesions due to congestion.  He underwent IVC filterplacement, percutaneous angioplastyand stent placementof the left common iliac vein and left external iliac vein on 10/13/2017.He was discharged on Flagyl, doxycycline, and Levaquin.  He has had outpatient wound care by home health.  Symptomatically, he is feeling better.  His left leg is minimally edematous.  He denies any fever.  He takes oxycodone 10 mg po BID prn pain.  He is on supplemental oral potassium and magnesium.  He denies any diarrhea.  He has mild tinnitus.   Past Medical History:  Diagnosis Date  . Asthma    AS A CHILD-NO INHALERS  . Cancer ([place])    testicular cancer 11/2016 per pt   . DVT (deep venous thrombosis) ([place])   . GERD (gastroesophageal reflux disease)    TUMS PRN     Past Surgical History:  Procedure Laterality Date  . ANKLE FRACTURE SURGERY Right 2004  . ORCHIECTOMY Left 11/26/2015   Procedure: ORCHIECTOMY;  Surgeon: [name], MD;  Location: [facility] ORS;  Service: Urology;  Laterality: Left;  radical/Inguinal approach  . PERIPHERAL VASCULAR CATHETERIZATION N/A 12/16/2015   Procedure: [name] Cath Insertion;  Surgeon: [name], MD;  Location: [place] CV LAB;  Service: Cardiovascular;  Laterality: N/A;  . PERIPHERAL VASCULAR THROMBECTOMY Left 10/13/2017   Procedure: PERIPHERAL VASCULAR THROMBECTOMY;  Surgeon: [name], MD;  Location: [place] CV LAB;  Service: Cardiovascular;  Laterality: Left;  . WISDOM TOOTH EXTRACTION  2017    Family History  Problem Relation Age of Onset  . Skin cancer Mother   . Breast cancer Maternal Grandmother   . Colon cancer Maternal Grandfather   . Diabetes Maternal Grandfather   . Emphysema Father   . Mental illness Sister        anxiety  . Stroke Paternal Grandmother   . Prostate cancer Neg Hx   . Kidney cancer Neg Hx   . Bladder Cancer Neg Hx     Social History:  reports that he has been smoking cigarettes. He has a 4.00 pack-year smoking history. He has quit using smokeless tobacco.  His smokeless tobacco use included snuff. He reports that he has current or past drug history. Drug: Marijuana. He reports that he does not drink alcohol.  He is accompanied by his mother, wife, and daughter.  Allergies: No Known Allergies  Medications Prior to Admission  Medication Sig Dispense Refill  . acetaminophen (TYLENOL) 500 MG tablet Take 500-1,000 mg by mouth every 6 (six) hours as needed for mild pain or fever.     Marland Kitchen dexamethasone (DECADRON) 4 MG tablet Take 4 mg by mouth daily.    Marland Kitchen doxycycline (VIBRA-TABS) 100 MG tablet Take 1 tablet (100 mg total) by mouth 2 (two) times daily for 10 days. 20 tablet 0  . DULoxetine (CYMBALTA) 60 MG capsule Take 1 tablet by mouth daily.    Marland Kitchen gabapentin  (NEURONTIN) 300 MG capsule Take 1 capsule (300 mg total) by mouth at bedtime. 30 capsule 0  . levofloxacin (LEVAQUIN) 500 MG tablet Take 1 tablet (500 mg total) by mouth daily for 10 days. 10 tablet 0  . magnesium oxide (MAG-OX) 400 MG tablet Take 1 tablet by mouth daily.    . metroNIDAZOLE (FLAGYL) 500 MG tablet Take 1 tablet (500 mg total) by mouth 3 (three) times daily for 10 days. 30 tablet 0  . nicotine (NICODERM CQ - DOSED IN MG/24 HR) 7 mg/24hr patch Place 7 mg onto the skin daily.     . ondansetron (ZOFRAN-ODT) 4 MG disintegrating tablet Take 2 tablets by mouth every 8 (eight) hours as needed.    Marland Kitchen oxyCODONE (OXYCONTIN) 10 mg 12 hr tablet Take 1 tablet (10 mg total) by mouth every 12 (twelve) hours for 7 days. 14 tablet 0  . Oxycodone HCl 10 MG TABS Take 1 tablet (10 mg total) by mouth every 6 (six) hours as needed (moderate and severe pain). 25 tablet 0  . polyethylene glycol (MIRALAX / GLYCOLAX) packet Take 17 g by mouth daily as needed for mild constipation. 14 each 0  . potassium chloride (KLOR-CON) 20 MEQ packet Take 20 mEq by mouth 2 (two) times daily.     . prochlorperazine (COMPAZINE) 10 MG tablet Take 1 tablet by mouth every 6 (six) hours as needed (nausea/vomiting).     . silver sulfADIAZINE (SILVADENE) 1 % cream Apply topically 2 (two) times daily. Apply to blisters on left thigh and cover with nonadherent dressing 50 g 0  . XARELTO 20 MG TABS tablet Take 1 tablet by mouth daily.      Review of Systems: GENERAL:  Feels "ok".  No fevers, sweats or weight loss. PERFORMANCE STATUS (ECOG):  1 HEENT:  Slight tinnitus.  No visual changes, runny nose, sore throat, mouth sores or tenderness. Lungs: No shortness of breath or cough.  No hemoptysis. Cardiac:  No chest pain, palpitations, orthopnea, or PND. GI:  No nausea, vomiting, diarrhea, constipation, melena or hematochezia. GU:  No urgency, frequency, dysuria, or hematuria. Musculoskeletal:  No back pain.  No joint pain.  No  muscle tenderness. Extremities:  Left leg with minimal edema and discomfort. Skin:  Skin lesions improving. Neuro:  No headache, numbness or weakness, balance or coordination issues. Endocrine:  No diabetes, thyroid issues, hot flashes or night sweats. Psych:  No mood changes, depression or anxiety. Pain:  Minimal left leg pain. Review of systems:  All other systems reviewed and found to be negative.  Physical Exam:  Blood pressure (!) 133/97, pulse 71, temperature 97.9 F (36.6 C), temperature source Oral, resp. rate 18, height _0  (1.905 m), weight 255 lb 11.2 oz (116 kg), SpO2 96 %.  GENERAL:  Well developed, well nourished, young man lying comfortably on the medical unit in no acute distress. MENTAL STATUS:  Alert and oriented to person, place and time. HEAD: Alopecia totalis.  Normocephalic, atraumatic, face  symmetric, no Cushingoid features. EYES:  Brown eyes.  Pupils equal round and reactive to light and accomodation.  No conjunctivitis or scleral icterus. ENT:  Oropharynx clear without lesion.  Tongue normal. Mucous membranes moist.  RESPIRATORY:  Clear to auscultation without rales, wheezes or rhonchi. CARDIOVASCULAR:  Regular rate and rhythm without murmur, rub or gallop. ABDOMEN:  Soft, non-tender, with active bowel sounds, and no hepatosplenomegaly.  No masses. SKIN:  Left thigh lesions covered (dressing change at 5 AM per patient). EXTREMITIES: Left lower extremity wrapped. No palpable cords. NEUROLOGICAL: Unremarkable. PSYCH:  Appropriate.   Results for orders placed or performed during the hospital encounter of 10/21/17 (from the past 48 hour(s))  Magnesium     Status: None   Collection Time: 10/21/17  4:39 PM  Result Value Ref Range   Magnesium 2.0 1.7 - 2.4 mg/dL    Comment: Performed at [facility], [address] [place], [place] [address]  CBC with Differential     Status: Abnormal   Collection Time: 10/21/17  4:39 PM  Result Value Ref Range   WBC  7.5 3.8 - 10.6 K/uL   RBC 3.23 (L) 4.40 - 5.90 MIL/uL   Hemoglobin 10.2 (L) 13.0 - 18.0 g/dL   HCT 29.7 (L) 40.0 - 52.0 %   MCV 92.0 80.0 - 100.0 fL   MCH 31.7 26.0 - 34.0 pg   MCHC 34.4 32.0 - 36.0 g/dL   RDW 15.7 (H) 11.5 - 14.5 %   Platelets 701 (H) 150 - 440 K/uL   Neutrophils Relative % 64 %   Neutro Abs 4.8 1.4 - 6.5 K/uL   Lymphocytes Relative 18 %   Lymphs Abs 1.4 1.0 - 3.6 K/uL   Monocytes Relative 16 %   Monocytes Absolute 1.2 (H) 0.2 - 1.0 K/uL   Eosinophils Relative 1 %   Eosinophils Absolute 0.0 0 - 0.7 K/uL   Basophils Relative 1 %   Basophils Absolute 0.1 0 - 0.1 K/uL    Comment: Performed at [facility], [address] [place], [place] [address]  AFP tumor marker     Status: None   Collection Time: 10/21/17  4:39 PM  Result Value Ref Range   AFP, Serum, Tumor Marker 2.6 0.0 - 8.3 ng/mL    Comment: (NOTE) Roche [employer] Electrochemiluminescence Immunoassay (ECLIA) Values obtained with different assay methods or kits cannot be used interchangeably.  Results cannot be interpreted as absolute evidence of the presence or absence of malignant disease. This test is not interpretable in pregnant females. Performed At: [facility] [place], [place] [id number] Rush Farmer MD [id number]   Beta hCG quant (ref lab)     Status: None   Collection Time: 10/21/17  4:39 PM  Result Value Ref Range   hCG Quant <1 0 - 3 mIU/mL    Comment: (NOTE) Roche ECLIA methodology Performed At: [facility] [place], [place] [id number] Rush Farmer MD [id number]   Basic metabolic panel     Status: Abnormal   Collection Time: 10/22/17  5:14 AM  Result Value Ref Range   Sodium 138 135 - 145 mmol/L   Potassium 3.4 (L) 3.5 - 5.1 mmol/L   Chloride 114 (H) 98 - 111 mmol/L   CO2 20 (L) 22 - 32 mmol/L   Glucose, Bld 107 (H) 70 - 99 mg/dL   BUN 9 6 - 20 mg/dL   Creatinine, Ser 0.61 0.61 - 1.24 mg/dL   Calcium 6.6 (L)  8.9 - 10.3 mg/dL   GFR calc non Af Amer >60 >60 mL/min   GFR calc Af Amer >60 >60 mL/min    Comment: (NOTE) The eGFR has been calculated using the CKD EPI equation. This calculation has not been validated in all clinical situations. eGFR's persistently <60 mL/min signify possible Chronic Kidney Disease.    Anion gap 4 (L) 5 - 15    Comment: Performed at [facility], [address] [place], [place] [address]  CBC     Status: Abnormal   Collection Time: 10/22/17  5:14 AM  Result Value Ref Range   WBC 7.6 3.8 - 10.6 K/uL   RBC 3.13 (L) 4.40 - 5.90 MIL/uL   Hemoglobin 10.3 (L) 13.0 - 18.0 g/dL   HCT 28.8 (L) 40.0 - 52.0 %   MCV 92.1 80.0 - 100.0 fL   MCH 32.7 26.0 - 34.0 pg   MCHC 35.6 32.0 - 36.0 g/dL   RDW 15.5 (H) 11.5 - 14.5 %   Platelets 673 (H) 150 - 440 K/uL    Comment: Performed at [facility], [address] [place], [place] [address]   No results found.  Assessment:  The patient is a 27 y.o. gentleman with recurrent testicular cancer who was admitted for cycle #2 TIP chemotherapy.  He has a history of LLE DVT. He developed left lower cellulitis and venous congestion syndrome with necrotic skin lesions. He is s/p IVC filterplacement, percutaneous angioplastyand stent placementof the left common iliac vein and left external iliac vein on 10/13/2017.  Symptomatically, he denies any complaint.  Plan:   1.  Oncology:  Cycle #2 TIP chemotherapy.  Chemotherapy started late last night (approximately 9 pm).  Paclitaxel 250 mg/m2 CI over 24 hours on Day 1  [place] 500 mg/m2 over 15 minutes before ifosfamide, then at 4 hours and 8 hours from the start of each ifosfamide dose on day 2-5.  Ifosfamide 1500 mg/m2 over 1 hour daily on days 2-5.  Cisplatin 25 mg/m2 over 60 minutes on days 2-5.  Hydration pre and post ifosfamide and cisplatin.  Premed for Taxol:  Famotidine, Benadryl, Decadron  Daily lab check:  CBC with diff, CMP, Mg, phosphorus,  urinalysis.  Neuro checks daily.  Anticipate Neulasta/Udencya in outpatient department post chemotherapy  2.   Hematology:  Left lower extremity DVT s/p stent placement.  Continue Xarelto. CBC daily.  3.   Infectious disease:  Recent history of left lower extremity cellulitis s/p 9 days of vancomycin and Cefepime.  No positive cultures.  Spoke with Dr Carlyle Basques, [place] in [place].  Plan for doxycycline 100 mg BID x 7 days.  Dr. Neville Route, new infectious disease physician, to see next week.  4.   Dermatology:  Continue wound care and dressing changes to left lower extremity.  5.   Fluids, Electrolyes and Nutrition:  Follow labs as above daily.  Initial calcium today low (6.6), but drawn from port-a-cath.  Calcium 9.4 on repeat (drawn peripherally) and consistent with outpatient labs.  Continue outpatient electrolyte supplementation (potassium chloride 20 meq BID and magnesium oxide 400 mg a day).  Ensure total daily fluid 2-3 liters.  Check UA daily to assure no hemorrhagic cystitis.  6.   Disposition:  Expect discharge home after chemotherapy complete if tolerating well.  Will receive Udencya in OPD day after chemotherapy.   Thank you for allowing me to participate in Mr [place] care.  I will follow him closely while an inpatient and after discharge  into the outpatient department.   [name], MD  10/22/2017, 9:01 AM

## 2017-10-23 LAB — URINALYSIS, COMPLETE (UACMP) WITH MICROSCOPIC
Bacteria, UA: NONE SEEN
Bilirubin Urine: NEGATIVE
Glucose, UA: 50 mg/dL — AB
Hgb urine dipstick: NEGATIVE
Ketones, ur: 80 mg/dL — AB
Leukocytes, UA: NEGATIVE
Nitrite: NEGATIVE
Protein, ur: NEGATIVE mg/dL
Specific Gravity, Urine: 1.011 (ref 1.005–1.030)
Squamous Epithelial / LPF: NONE SEEN (ref 0–5)
pH: 5 (ref 5.0–8.0)

## 2017-10-23 LAB — COMPREHENSIVE METABOLIC PANEL
ALT: 21 U/L (ref 0–44)
AST: 25 U/L (ref 15–41)
Albumin: 3.4 g/dL — ABNORMAL LOW (ref 3.5–5.0)
Alkaline Phosphatase: 45 U/L (ref 38–126)
Anion gap: 7 (ref 5–15)
BUN: 16 mg/dL (ref 6–20)
CO2: 26 mmol/L (ref 22–32)
Calcium: 8.6 mg/dL — ABNORMAL LOW (ref 8.9–10.3)
Chloride: 108 mmol/L (ref 98–111)
Creatinine, Ser: 0.77 mg/dL (ref 0.61–1.24)
GFR calc Af Amer: >60 mL/min (ref >60)
GFR calc non Af Amer: >60 mL/min (ref >60)
Glucose, Bld: 140 mg/dL — ABNORMAL HIGH (ref 70–99)
Potassium: 4 mmol/L (ref 3.5–5.1)
Sodium: 141 mmol/L (ref 135–145)
Total Bilirubin: 0.6 mg/dL (ref 0.3–1.2)
Total Protein: 6.6 g/dL (ref 6.5–8.1)

## 2017-10-23 LAB — CBC WITH DIFFERENTIAL/PLATELET
Basophils Absolute: 0 10*3/uL (ref 0–0.1)
Basophils Relative: 1 %
Eosinophils Absolute: 0 10*3/uL (ref 0–0.7)
Eosinophils Relative: 0 %
HCT: 30.1 % — ABNORMAL LOW (ref 40.0–52.0)
Hemoglobin: 10.2 g/dL — ABNORMAL LOW (ref 13.0–18.0)
Lymphocytes Relative: 6 %
Lymphs Abs: 0.3 10*3/uL — ABNORMAL LOW (ref 1.0–3.6)
MCH: 31.4 pg (ref 26.0–34.0)
MCHC: 33.9 g/dL (ref 32.0–36.0)
MCV: 92.7 fL (ref 80.0–100.0)
Monocytes Absolute: 0.1 10*3/uL — ABNORMAL LOW (ref 0.2–1.0)
Monocytes Relative: 3 %
Neutro Abs: 4.2 10*3/uL (ref 1.4–6.5)
Neutrophils Relative %: 90 %
Platelets: 574 10*3/uL — ABNORMAL HIGH (ref 150–440)
RBC: 3.25 MIL/uL — ABNORMAL LOW (ref 4.40–5.90)
RDW: 15.4 % — ABNORMAL HIGH (ref 11.5–14.5)
WBC: 4.6 10*3/uL (ref 3.8–10.6)

## 2017-10-23 LAB — MAGNESIUM: Magnesium: 2.1 mg/dL (ref 1.7–2.4)

## 2017-10-23 LAB — PHOSPHORUS: Phosphorus: 3.3 mg/dL (ref 2.5–4.6)

## 2017-10-23 MED ORDER — SODIUM CHLORIDE 0.9 % IV SOLN
Freq: Once | INTRAVENOUS | Status: AC
  Administered 2017-10-24: 01:00:00 via INTRAVENOUS
  Filled 2017-10-23: qty 76

## 2017-10-23 MED ORDER — SODIUM CHLORIDE 0.9 % IV SOLN
500.0000 mg/m2 | INTRAVENOUS | Status: AC
  Administered 2017-10-24 (×2): 1250 mg via INTRAVENOUS
  Filled 2017-10-23 (×2): qty 12.5

## 2017-10-23 MED ORDER — SODIUM CHLORIDE 0.9 % IV SOLN
500.0000 mg/m2 | Freq: Once | INTRAVENOUS | Status: AC
  Administered 2017-10-23: 23:00:00 1250 mg via INTRAVENOUS
  Filled 2017-10-23: qty 12.5

## 2017-10-23 MED ORDER — SODIUM CHLORIDE 0.9 % IV SOLN
20.0000 mg | Freq: Once | INTRAVENOUS | Status: AC
  Administered 2017-10-23: 20 mg via INTRAVENOUS
  Filled 2017-10-23: qty 2

## 2017-10-23 MED ORDER — SODIUM CHLORIDE 0.9 % IV SOLN
25.0000 mg/m2 | Freq: Once | INTRAVENOUS | Status: AC
  Administered 2017-10-24: 64 mg via INTRAVENOUS
  Filled 2017-10-23: qty 64

## 2017-10-23 MED ORDER — POTASSIUM CHLORIDE 2 MEQ/ML IV SOLN
Freq: Once | INTRAVENOUS | Status: AC
  Administered 2017-10-23: 21:00:00 via INTRAVENOUS
  Filled 2017-10-23: qty 10

## 2017-10-23 NOTE — Progress Notes (Signed)
Reports "just tired"; slept long naps this morning and afternoon. Reports pain basically controlled

## 2017-10-23 NOTE — Progress Notes (Addendum)
[place] [facility] [place] NAME: Edgar [name]    MR#:  [id number]  DATE OF BIRTH:  1990-08-30  SUBJECTIVE:  CHIEF COMPLAINT:  No chief complaint on file. sent from cancer center for iv chemotherapy. No complains today.  REVIEW OF SYSTEMS:  CONSTITUTIONAL: No fever, fatigue or weakness.  EYES: No blurred or double vision.  EARS, NOSE, AND THROAT: No tinnitus or ear pain.  RESPIRATORY: No cough, shortness of breath, wheezing or hemoptysis.  CARDIOVASCULAR: No chest pain, orthopnea, edema.  GASTROINTESTINAL: No nausea, vomiting, diarrhea or abdominal pain.  GENITOURINARY: No dysuria, hematuria.  ENDOCRINE: No polyuria, nocturia,  HEMATOLOGY: No anemia, easy bruising or bleeding SKIN: No rash or lesion. MUSCULOSKELETAL: No joint pain or arthritis.   NEUROLOGIC: No tingling, numbness, weakness.  PSYCHIATRY: No anxiety or depression.   ROS  DRUG ALLERGIES:  No Known Allergies  VITALS:  Blood pressure 126/84, pulse 67, temperature 97.6 F (36.4 C), temperature source Oral, resp. rate 15, height 6\' 3"  (1.905 m), weight 116 kg, SpO2 97 %.  PHYSICAL EXAMINATION:  GENERAL:  27 y.o.-year-old patient lying in the bed with no acute distress.  EYES: Pupils equal, round, reactive to light and accommodation. No scleral icterus. Extraocular muscles intact.  HEENT: Head atraumatic, normocephalic. Oropharynx and nasopharynx clear.  NECK:  Supple, no jugular venous distention. No thyroid enlargement, no tenderness.  LUNGS: Normal breath sounds bilaterally, no wheezing, rales,rhonchi or crepitation. No use of accessory muscles of respiration.  CARDIOVASCULAR: S1, S2 normal. No murmurs, rubs, or gallops.  ABDOMEN: Soft, nontender, nondistended. Bowel sounds present. No organomegaly or mass.  EXTREMITIES: No pedal edema, cyanosis, or clubbing.  Healing blisters on left thigh. NEUROLOGIC: Cranial nerves II through XII are intact. Muscle strength 5/5 in all  extremities. Sensation intact. Gait not checked.  PSYCHIATRIC: The patient is alert and oriented x 3.  SKIN: No obvious rash, lesion, or ulcer.   Physical Exam LABORATORY PANEL:   CBC Recent Labs  Lab 10/23/17 0437  WBC 4.6  HGB 10.2*  HCT 30.1*  PLT 574*   ------------------------------------------------------------------------------------------------------------------  Chemistries  Recent Labs  Lab 10/23/17 0437  NA 141  K 4.0  CL 108  CO2 26  GLUCOSE 140*  BUN 16  CREATININE 0.77  CALCIUM 8.6*  MG 2.1  AST 25  ALT 21  ALKPHOS 45  BILITOT 0.6   ------------------------------------------------------------------------------------------------------------------  Cardiac Enzymes No results for input(s): TROPONINI in the last 168 hours. ------------------------------------------------------------------------------------------------------------------  RADIOLOGY:  No results found.  ASSESSMENT AND PLAN:   Active Problems:   Scrotal cancer [facility])  *Testicular cancer Chemotherapy as per oncologist.  *DVT Continue Xarelto.  *Left thigh cellulitis As per last discharge, he was supposed to continue antibiotics until 13 August, Pt received 10 days Abx in hospital, and 7 days since then. His cellulitis resolved. He did not have bacteremia and cx were negative.  * Smoking Counseled to quit smoking for 4 minutes and offered nicotine patch.  Discussed with Dr. Rogue Bussing. All the records are reviewed and case discussed with Care Management/Social Workerr. Management plans discussed with the patient, family and they are in agreement.  CODE STATUS: full.  TOTAL TIME TAKING CARE OF THIS PATIENT: 35 minutes.   POSSIBLE D/C IN 3 DAYS, DEPENDING ON CLINICAL CONDITION.   Demetrios Loll M.D on 10/23/2017   Between 7am to 6pm - Pager - [phone]  After 6pm go to [email or web address] - Proofreader  [employer]  [phone]  CC: Primary  care physician; Patient, No Pcp Per  Note: This dictation was prepared with Dragon dictation along with smaller phrase technology. Any transcriptional errors that result from this process are unintentional.

## 2017-10-23 NOTE — Progress Notes (Signed)
Edgar [name]   DOB:[birth year]   SM#:[id number]    Subjective: Patient had chemotherapy last night with Taxol.  Tolerated well.  No nausea no vomiting.  No tingling or numbness.  No constipation.  No diarrhea.  Objective:  Vitals:   10/22/17 2013 10/23/17 0440  BP: 133/87 126/84  Pulse: 85 67  Resp: 18 15  Temp: 97.9 F (36.6 C) 97.6 F (36.4 C)  SpO2: 99% 97%     Intake/Output Summary (Last 24 hours) at 10/23/2017 1055 Last data filed at 10/23/2017 0643 Gross per 24 hour  Intake 1234.2 ml  Output 1150 ml  Net 84.2 ml    GENERAL Alert, no distress and comfortable.  Patient is alone. EYES: no pallor or icterus OROPHARYNX: no thrush or ulceration. NECK: supple, no masses felt LYMPH:  no palpable lymphadenopathy in the cervical, axillary or inguinal regions LUNGS: decreased breath sounds to auscultation at bases and  No wheeze or crackles HEART/CVS: regular rate & rhythm and no murmurs; left leg swollen compared to right.  Pulses felt. ABDOMEN: abdomen soft, tender  on deep palpation. and normal bowel sounds Musculoskeletal:no cyanosis of digits and no clubbing  PSYCH: alert & oriented x 3 with fluent speech NEURO: no focal motor/sensory deficits SKIN:  no rashes or significant lesions   Labs:  Lab Results  Component Value Date   WBC 4.6 10/23/2017   HGB 10.2 (L) 10/23/2017   HCT 30.1 (L) 10/23/2017   MCV 92.7 10/23/2017   PLT 574 (H) 10/23/2017   NEUTROABS 4.2 10/23/2017    Lab Results  Component Value Date   NA 141 10/23/2017   K 4.0 10/23/2017   CL 108 10/23/2017   CO2 26 10/23/2017    Studies:  No results found.  Assessment & Plan:   # 27 year old patient with testicular cancer currently up to the hospital for salvage chemotherapy.   #Recurrent testicular cancer-currently on TIP chemotherapy cycle number 2-day #2 today-patient will be receiving cisplatin/ifosfamide/mesna today.  Patient tolerated Taxol well yesterday. Labs- adequate for chemo tonite.    # Left lower extremity DVT/Cellulitis -status post thrombectomy/on Xarelto; on doxyclcyine.    Cammie Sickle, MD 10/23/2017  10:55 AM

## 2017-10-23 NOTE — Progress Notes (Signed)
Reports feeling generally tired with long naps today especially this morning. Denies chemo effects. Wound care performed as ordered with Dr. Bridgett Larsson evaluating wounds with wound granulating rapidly/shallow at this time. Ambulated in the hall with family. Chemo precautions observed. Reports pain controlled with current plan.

## 2017-10-23 NOTE — Progress Notes (Signed)
Patient received chemotherapy without complications. VSS.

## 2017-10-24 DIAGNOSIS — C6212 Malignant neoplasm of descended left testis: Secondary | ICD-10-CM

## 2017-10-24 LAB — URINALYSIS, COMPLETE (UACMP) WITH MICROSCOPIC
Bacteria, UA: NONE SEEN
Bilirubin Urine: NEGATIVE
Glucose, UA: NEGATIVE mg/dL
Hgb urine dipstick: NEGATIVE
Ketones, ur: 80 mg/dL — AB
Leukocytes, UA: NEGATIVE
Nitrite: NEGATIVE
Protein, ur: NEGATIVE mg/dL
Specific Gravity, Urine: 1.013 (ref 1.005–1.030)
Squamous Epithelial / LPF: NONE SEEN (ref 0–5)
pH: 6 (ref 5.0–8.0)

## 2017-10-24 LAB — CBC WITH DIFFERENTIAL/PLATELET
Basophils Absolute: 0 10*3/uL (ref 0–0.1)
Basophils Relative: 0 %
Eosinophils Absolute: 0 10*3/uL (ref 0–0.7)
Eosinophils Relative: 0 %
HCT: 27.7 % — ABNORMAL LOW (ref 40.0–52.0)
Hemoglobin: 9.4 g/dL — ABNORMAL LOW (ref 13.0–18.0)
Lymphocytes Relative: 6 %
Lymphs Abs: 0.2 10*3/uL — ABNORMAL LOW (ref 1.0–3.6)
MCH: 31.2 pg (ref 26.0–34.0)
MCHC: 34 g/dL (ref 32.0–36.0)
MCV: 91.7 fL (ref 80.0–100.0)
Monocytes Absolute: 0.1 10*3/uL — ABNORMAL LOW (ref 0.2–1.0)
Monocytes Relative: 2 %
Neutro Abs: 2.7 10*3/uL (ref 1.4–6.5)
Neutrophils Relative %: 92 %
Platelets: 456 10*3/uL — ABNORMAL HIGH (ref 150–440)
RBC: 3.02 MIL/uL — ABNORMAL LOW (ref 4.40–5.90)
RDW: 15.5 % — ABNORMAL HIGH (ref 11.5–14.5)
WBC: 3 10*3/uL — ABNORMAL LOW (ref 3.8–10.6)

## 2017-10-24 LAB — PHOSPHORUS: Phosphorus: 3.6 mg/dL (ref 2.5–4.6)

## 2017-10-24 MED ORDER — SODIUM CHLORIDE 0.9 % IV SOLN
20.0000 mg | Freq: Once | INTRAVENOUS | Status: AC
  Administered 2017-10-25: 01:00:00 20 mg via INTRAVENOUS
  Filled 2017-10-24: qty 2

## 2017-10-24 MED ORDER — DEXTROSE-NACL 5-0.45 % IV SOLN
Freq: Once | INTRAVENOUS | Status: AC
  Administered 2017-10-24: 22:00:00 via INTRAVENOUS
  Filled 2017-10-24: qty 10

## 2017-10-24 MED ORDER — SODIUM CHLORIDE 0.9 % IV SOLN
500.0000 mg/m2 | INTRAVENOUS | Status: AC
  Administered 2017-10-25 (×2): 1250 mg via INTRAVENOUS
  Filled 2017-10-24 (×2): qty 12.5

## 2017-10-24 MED ORDER — MESNA 100 MG/ML IV SOLN
500.0000 mg/m2 | Freq: Once | INTRAVENOUS | Status: AC
  Administered 2017-10-24: 1250 mg via INTRAVENOUS
  Filled 2017-10-24: qty 12.5

## 2017-10-24 MED ORDER — SODIUM CHLORIDE 0.9 % IV SOLN
Freq: Once | INTRAVENOUS | Status: AC
  Administered 2017-10-25: via INTRAVENOUS
  Filled 2017-10-24: qty 76

## 2017-10-24 MED ORDER — PALONOSETRON HCL INJECTION 0.25 MG/5ML
0.2500 mg | Freq: Once | INTRAVENOUS | Status: AC
  Administered 2017-10-25: 01:00:00 0.25 mg via INTRAVENOUS
  Filled 2017-10-24: qty 5

## 2017-10-24 MED ORDER — SODIUM CHLORIDE 0.9 % IV SOLN
25.0000 mg/m2 | Freq: Once | INTRAVENOUS | Status: AC
  Administered 2017-10-25: 02:00:00 64 mg via INTRAVENOUS
  Filled 2017-10-24: qty 64

## 2017-10-24 NOTE — Progress Notes (Signed)
Pharmacy contacted due to wrong chemo drug being sent to floor, Cisplatin was scheduled to infuse at 1:00am. When this nurse went to hang Cisplatin it was not Cisplatin, It was a duplicate bag of the Ifosfamide that was given earlier.

## 2017-10-24 NOTE — Progress Notes (Signed)
Pharmacist stated he would call his boss at 6:00am to see what can be done about the mix up in drugs. He stated Cisplatin was not in pharmacy and had to be made in cancer center pharmacy and verified by two pharmacists, and there is only one here now.  Stated he would call me back and let me know what he finds out.

## 2017-10-24 NOTE — Progress Notes (Signed)
[place]   DOB:1990/09/14   JT#:[id number]    Subjective: Patient received cisplatin/ifosfamide mesna last night.  There is some confusion about IV chemotherapy; however patient got appropriate chemotherapy no nausea no vomiting. No constipation.  No diarrhea.  Objective:  Vitals:   10/23/17 2001 10/24/17 0408  BP: 135/87 125/87  Pulse: 72 67  Resp: 18 17  Temp: 97.9 F (36.6 C) 97.6 F (36.4 C)  SpO2: 100% 98%     Intake/Output Summary (Last 24 hours) at 10/24/2017 1147 Last data filed at 10/24/2017 0906 Gross per 24 hour  Intake 1977.94 ml  Output 600 ml  Net 1377.94 ml    GENERAL Alert, no distress and comfortable.  Patient is alone. EYES: no pallor or icterus OROPHARYNX: no thrush or ulceration. NECK: supple, no masses felt LYMPH:  no palpable lymphadenopathy in the cervical, axillary or inguinal regions LUNGS: decreased breath sounds to auscultation at bases and  No wheeze or crackles HEART/CVS: regular rate & rhythm and no murmurs; left leg swollen compared to right.  Pulses felt. ABDOMEN: abdomen soft, tender  on deep palpation. and normal bowel sounds Musculoskeletal:no cyanosis of digits and no clubbing  PSYCH: alert & oriented x 3 with fluent speech NEURO: no focal motor/sensory deficits SKIN:  no rashes or significant lesions   Labs:  Lab Results  Component Value Date   WBC 3.0 (L) 10/24/2017   HGB 9.4 (L) 10/24/2017   HCT 27.7 (L) 10/24/2017   MCV 91.7 10/24/2017   PLT 456 (H) 10/24/2017   NEUTROABS 2.7 10/24/2017    Lab Results  Component Value Date   NA 141 10/23/2017   K 4.0 10/23/2017   CL 108 10/23/2017   CO2 26 10/23/2017    Studies:  No results found.  Assessment & Plan:   # 27 year old patient with testicular cancer currently up to the hospital for salvage chemotherapy.   #Recurrent testicular cancer-currently on TIP chemotherapy cycle number 2-day #3 today.  Tolerating well no side effects noted. some confusion about day #2  chemotherapy infusion.  Discussed with the nurse.  Reviewed the MAR. Also discussed with in patient pharmacy.   # Left lower extremity DVT/Cellulitis -status post thrombectomy/on Xarelto; on doxyclcyine.   # Dr.Corocran to follow starting 8/12 AM.   Cammie Sickle, MD 10/24/2017  11:47 AM

## 2017-10-24 NOTE — Progress Notes (Signed)
Post chemo care performed. Denies co's/chemo effects.  Wound care performed with noted increasing granulation especially upper outer wound on left thigh. Oxycontin and oxycodone given with pain controlled. Slept at intervals. Family in.

## 2017-10-24 NOTE — Progress Notes (Signed)
[facility] [place] NAME: Delsin Copen    MR#:  [id number]  DATE OF BIRTH:  03-09-1991  SUBJECTIVE:  CHIEF COMPLAINT:  No chief complaint on file. sent from cancer center for iv chemotherapy. No complains today.  REVIEW OF SYSTEMS:  CONSTITUTIONAL: No fever, fatigue or weakness.  EYES: No blurred or double vision.  EARS, NOSE, AND THROAT: No tinnitus or ear pain.  RESPIRATORY: No cough, shortness of breath, wheezing or hemoptysis.  CARDIOVASCULAR: No chest pain, orthopnea, edema.  GASTROINTESTINAL: No nausea, vomiting, diarrhea or abdominal pain.  GENITOURINARY: No dysuria, hematuria.  ENDOCRINE: No polyuria, nocturia,  HEMATOLOGY: No anemia, easy bruising or bleeding SKIN: No rash or lesion. MUSCULOSKELETAL: No joint pain or arthritis.   NEUROLOGIC: No tingling, numbness, weakness.  PSYCHIATRY: No anxiety or depression.   ROS  DRUG ALLERGIES:  No Known Allergies  VITALS:  Blood pressure 125/87, pulse 67, temperature 97.6 F (36.4 C), temperature source Oral, resp. rate 17, height 6\' 3"  (1.905 m), weight 116 kg, SpO2 98 %.  PHYSICAL EXAMINATION:  GENERAL:  27 y.o.-year-old patient lying in the bed with no acute distress.   EYES: Pupils equal, round, reactive to light and accommodation. No scleral icterus. Extraocular muscles intact.  HEENT: Head atraumatic, normocephalic. Oropharynx and nasopharynx clear.  NECK:  Supple, no jugular venous distention. No thyroid enlargement, no tenderness.  LUNGS: Normal breath sounds bilaterally, no wheezing, rales,rhonchi or crepitation. No use of accessory muscles of respiration.  CARDIOVASCULAR: S1, S2 normal. No murmurs, rubs, or gallops.  ABDOMEN: Soft, nontender, nondistended. Bowel sounds present. No organomegaly or mass.  EXTREMITIES: No pedal edema, cyanosis, or clubbing.  Healing blisters on left thigh. NEUROLOGIC: Cranial nerves II through XII are intact. Muscle strength 5/5 in all  extremities. Sensation intact. Gait not checked.  PSYCHIATRIC: The patient is alert and oriented x 3.  SKIN: No obvious rash, lesion, or ulcer.   Physical Exam LABORATORY PANEL:   CBC Recent Labs  Lab 10/24/17 0543  WBC 3.0*  HGB 9.4*  HCT 27.7*  PLT 456*   ------------------------------------------------------------------------------------------------------------------  Chemistries  Recent Labs  Lab 10/23/17 0437  NA 141  K 4.0  CL 108  CO2 26  GLUCOSE 140*  BUN 16  CREATININE 0.77  CALCIUM 8.6*  MG 2.1  AST 25  ALT 21  ALKPHOS 45  BILITOT 0.6   ------------------------------------------------------------------------------------------------------------------  Cardiac Enzymes No results for input(s): TROPONINI in the last 168 hours. ------------------------------------------------------------------------------------------------------------------  RADIOLOGY:  No results found.  ASSESSMENT AND PLAN:   Active Problems:   Scrotal cancer [facility])  *Testicular cancer Chemotherapy as per oncologist.  Follow-up CBC.  Leukopenia.  Possible due to chemo.  Follow-up CBC.  *DVT Continue Xarelto.  *Left thigh cellulitis As per last discharge, he was supposed to continue antibiotics until 13 August, Pt received 10 days Abx in hospital, and 7 days since then. His cellulitis resolved. He did not have bacteremia and cx were negative.  * Smoking Counseled to quit smoking for 4 minutes and offered nicotine patch.  Discussed with Dr. Rogue Bussing. All the records are reviewed and case discussed with Care Management/Social Workerr. Management plans discussed with the patient, family and they are in agreement.  CODE STATUS: full.  TOTAL TIME TAKING CARE OF THIS PATIENT: 25 minutes.   POSSIBLE D/C IN 2 DAYS, DEPENDING ON CLINICAL CONDITION.   Demetrios Loll M.D on 10/24/2017   Between 7am to 6pm - Pager - [phone]  After 6pm go to  [email or web address] - password [email or web address]  [facility]  Office  [phone]  CC: Primary care physician; Patient, No Pcp Per  Note: This dictation was prepared with Dragon dictation along with smaller phrase technology. Any transcriptional errors that result from this process are unintentional.

## 2017-10-25 ENCOUNTER — Other Ambulatory Visit: Payer: Self-pay | Admitting: Hematology and Oncology

## 2017-10-25 DIAGNOSIS — Z79899 Other long term (current) drug therapy: Secondary | ICD-10-CM

## 2017-10-25 DIAGNOSIS — Z86718 Personal history of other venous thrombosis and embolism: Secondary | ICD-10-CM

## 2017-10-25 LAB — URINALYSIS, COMPLETE (UACMP) WITH MICROSCOPIC
Bacteria, UA: NONE SEEN
Bilirubin Urine: NEGATIVE
Glucose, UA: NEGATIVE mg/dL
Hgb urine dipstick: NEGATIVE
Ketones, ur: 80 mg/dL — AB
Leukocytes, UA: NEGATIVE
Nitrite: NEGATIVE
Protein, ur: NEGATIVE mg/dL
Specific Gravity, Urine: 1.004 — ABNORMAL LOW (ref 1.005–1.030)
Squamous Epithelial / LPF: NONE SEEN (ref 0–5)
pH: 6 (ref 5.0–8.0)

## 2017-10-25 LAB — CBC WITH DIFFERENTIAL/PLATELET
Basophils Absolute: 0 10*3/uL (ref 0–0.1)
Basophils Relative: 1 %
Eosinophils Absolute: 0 10*3/uL (ref 0–0.7)
Eosinophils Relative: 0 %
HCT: 28.7 % — ABNORMAL LOW (ref 40.0–52.0)
Hemoglobin: 9.7 g/dL — ABNORMAL LOW (ref 13.0–18.0)
Lymphocytes Relative: 7 %
Lymphs Abs: 0.3 10*3/uL — ABNORMAL LOW (ref 1.0–3.6)
MCH: 31 pg (ref 26.0–34.0)
MCHC: 33.8 g/dL (ref 32.0–36.0)
MCV: 91.8 fL (ref 80.0–100.0)
Monocytes Absolute: 0 10*3/uL — ABNORMAL LOW (ref 0.2–1.0)
Monocytes Relative: 1 %
Neutro Abs: 3.5 10*3/uL (ref 1.4–6.5)
Neutrophils Relative %: 91 %
Platelets: 422 10*3/uL (ref 150–440)
RBC: 3.12 MIL/uL — ABNORMAL LOW (ref 4.40–5.90)
RDW: 15.3 % — ABNORMAL HIGH (ref 11.5–14.5)
WBC: 3.9 10*3/uL (ref 3.8–10.6)

## 2017-10-25 LAB — COMPREHENSIVE METABOLIC PANEL
ALT: 18 U/L (ref 0–44)
AST: 20 U/L (ref 15–41)
Albumin: 3.2 g/dL — ABNORMAL LOW (ref 3.5–5.0)
Alkaline Phosphatase: 34 U/L — ABNORMAL LOW (ref 38–126)
Anion gap: 5 (ref 5–15)
BUN: 15 mg/dL (ref 6–20)
CO2: 26 mmol/L (ref 22–32)
Calcium: 8.6 mg/dL — ABNORMAL LOW (ref 8.9–10.3)
Chloride: 105 mmol/L (ref 98–111)
Creatinine, Ser: 0.65 mg/dL (ref 0.61–1.24)
GFR calc Af Amer: >60 mL/min (ref >60)
GFR calc non Af Amer: >60 mL/min (ref >60)
Glucose, Bld: 127 mg/dL — ABNORMAL HIGH (ref 70–99)
Potassium: 3.8 mmol/L (ref 3.5–5.1)
Sodium: 136 mmol/L (ref 135–145)
Total Bilirubin: 0.7 mg/dL (ref 0.3–1.2)
Total Protein: 6 g/dL — ABNORMAL LOW (ref 6.5–8.1)

## 2017-10-25 LAB — PHOSPHORUS: Phosphorus: 3.1 mg/dL (ref 2.5–4.6)

## 2017-10-25 LAB — MAGNESIUM: Magnesium: 2.1 mg/dL (ref 1.7–2.4)

## 2017-10-25 MED ORDER — SODIUM CHLORIDE 0.9 % IV SOLN
Freq: Once | INTRAVENOUS | Status: AC
  Administered 2017-10-26: 01:00:00 via INTRAVENOUS
  Filled 2017-10-25: qty 76

## 2017-10-25 MED ORDER — POTASSIUM CHLORIDE 2 MEQ/ML IV SOLN
Freq: Once | INTRAVENOUS | Status: AC
  Administered 2017-10-25: 22:00:00 via INTRAVENOUS
  Filled 2017-10-25: qty 10

## 2017-10-25 MED ORDER — MESNA 100 MG/ML IV SOLN
500.0000 mg/m2 | Freq: Once | INTRAVENOUS | Status: AC
  Administered 2017-10-26: 1250 mg via INTRAVENOUS
  Filled 2017-10-25: qty 12.5

## 2017-10-25 MED ORDER — SODIUM CHLORIDE 0.9 % IV SOLN
20.0000 mg | Freq: Once | INTRAVENOUS | Status: AC
  Administered 2017-10-26: 20 mg via INTRAVENOUS
  Filled 2017-10-25: qty 2

## 2017-10-25 MED ORDER — SODIUM CHLORIDE 0.9 % IV SOLN
500.0000 mg/m2 | INTRAVENOUS | Status: AC
  Administered 2017-10-26 (×2): 1250 mg via INTRAVENOUS
  Filled 2017-10-25 (×2): qty 12.5

## 2017-10-25 MED ORDER — SODIUM CHLORIDE 0.9 % IV SOLN
25.0000 mg/m2 | Freq: Once | INTRAVENOUS | Status: AC
  Administered 2017-10-26: 64 mg via INTRAVENOUS
  Filled 2017-10-25: qty 64

## 2017-10-25 NOTE — Progress Notes (Signed)
[place] [facility] [place] NAME: Edgar [name]    MR#:  [id number]  DATE OF BIRTH:  [birth year]  SUBJECTIVE:  CHIEF COMPLAINT:  No chief complaint on file. sent from cancer center for iv chemotherapy. No complains today. REVIEW OF SYSTEMS:  CONSTITUTIONAL: No fever, fatigue or weakness.  EYES: No blurred or double vision.  EARS, NOSE, AND THROAT: No tinnitus or ear pain.  RESPIRATORY: No cough, shortness of breath, wheezing or hemoptysis.  CARDIOVASCULAR: No chest pain, orthopnea, edema.  GASTROINTESTINAL: No nausea, vomiting, diarrhea or abdominal pain.  GENITOURINARY: No dysuria, hematuria.  ENDOCRINE: No polyuria, nocturia,  HEMATOLOGY: No anemia, easy bruising or bleeding SKIN: No rash or lesion. MUSCULOSKELETAL: No joint pain or arthritis.   NEUROLOGIC: No tingling, numbness, weakness.  PSYCHIATRY: No anxiety or depression.   ROS  DRUG ALLERGIES:  No Known Allergies  VITALS:  Blood pressure (!) 139/94, pulse 93, temperature 98.3 F (36.8 C), temperature source Oral, resp. rate 20, height 6\' 3"  (1.905 m), weight 116 kg, SpO2 98 %.  PHYSICAL EXAMINATION:  GENERAL:  27 y.o.-year-old patient lying in the bed with no acute distress.   EYES: Pupils equal, round, reactive to light and accommodation. No scleral icterus. Extraocular muscles intact.  HEENT: Head atraumatic, normocephalic. Oropharynx and nasopharynx clear.  NECK:  Supple, no jugular venous distention. No thyroid enlargement, no tenderness.  LUNGS: Normal breath sounds bilaterally, no wheezing, rales,rhonchi or crepitation. No use of accessory muscles of respiration.  CARDIOVASCULAR: S1, S2 normal. No murmurs, rubs, or gallops.  ABDOMEN: Soft, nontender, nondistended. Bowel sounds present. No organomegaly or mass.  EXTREMITIES: No pedal edema, cyanosis, or clubbing.  Healing blisters on left thigh. NEUROLOGIC: Cranial nerves II through XII are intact. Muscle strength 5/5 in all  extremities. Sensation intact. Gait not checked.  PSYCHIATRIC: The patient is alert and oriented x 3.  SKIN: No obvious rash, lesion, or ulcer.   Physical Exam LABORATORY PANEL:   CBC Recent Labs  Lab 10/25/17 0342  WBC 3.9  HGB 9.7*  HCT 28.7*  PLT 422   ------------------------------------------------------------------------------------------------------------------  Chemistries  Recent Labs  Lab 10/25/17 0342  NA 136  K 3.8  CL 105  CO2 26  GLUCOSE 127*  BUN 15  CREATININE 0.65  CALCIUM 8.6*  MG 2.1  AST 20  ALT 18  ALKPHOS 34*  BILITOT 0.7   ------------------------------------------------------------------------------------------------------------------  Cardiac Enzymes No results for input(s): TROPONINI in the last 168 hours. ------------------------------------------------------------------------------------------------------------------  RADIOLOGY:  No results found.  ASSESSMENT AND PLAN:   Active Problems:   Scrotal cancer [facility])  *Testicular cancer Chemotherapy as per oncologist.  Follow-up CBC.  Leukopenia.  Possible due to chemo.  Better.  Follow-up CBC.  *DVT Continue Xarelto.  *Left thigh cellulitis As per last discharge, he was supposed to continue antibiotics until 13 August, Pt received 10 days Abx in hospital, and 7 days since then. His cellulitis resolved. He did not have bacteremia and cx were negative.  On local wound care.  * Smoking Counseled to quit smoking for 4 minutes and offered nicotine patch.  All the records are reviewed and case discussed with Care Management/Social Workerr. Management plans discussed with the patient, family and they are in agreement.  CODE STATUS: full.  TOTAL TIME TAKING CARE OF THIS PATIENT: 25 minutes.   POSSIBLE D/C IN 1 DAYS, DEPENDING ON CLINICAL CONDITION.   Demetrios Loll M.D on 10/25/2017   Between 7am to 6pm - Pager - [phone]  After  6pm go to [email or web address] - password [email or web address]  [facility]  Office  [phone]  CC: Primary care physician; Patient, No Pcp Per  Note: This dictation was prepared with Dragon dictation along with smaller phrase technology. Any transcriptional errors that result from this process are unintentional.

## 2017-10-25 NOTE — Progress Notes (Signed)
[facility] Hematology/Oncology Progress Note  Date of admission: 10/21/2017  Hospital day:  10/25/2017  Chief Complaint: Edgar [name] is a 27 y.o. male with recurrent testicular cancer who was admitted through the medical oncology clinic for cycle #2 TIP chemotherapy.  Subjective:  Feels "fine".  No complaints.  Denies nausea, vomiting and diarrhea.  Left leg pain well controlled.  Social History: The patient is alone today.  Allergies: No Known Allergies  Scheduled Medications: . [START ON 10/26/2017] CISplatin  25 mg/m2 (Treatment Plan Recorded) Intravenous Once  . dexamethasone  4 mg Oral Daily  . D5-1/2NS + KCL + MG +/- MANNITOL IV CISplatin hydration   Intravenous Once  . doxycycline  100 mg Oral Q12H  . DULoxetine  60 mg Oral Daily  . gabapentin  300 mg Oral QHS  . [START ON 10/26/2017] ifosfamide/mesna (IFEX/MESNEX) CHEMO IV infusion   Intravenous Once  . magnesium oxide  400 mg Oral Daily  . nicotine  21 mg Transdermal Daily  . oxyCODONE  10 mg Oral Q12H  . rivaroxaban  20 mg Oral Daily  . silver sulfADIAZINE  1 application Topical BID    Review of Systems: GENERAL:  Feels "fine".  Walks around when family here.  No fevers, sweats or weight loss. PERFORMANCE STATUS (ECOG):  1 HEENT:  Slight tinnitus.  No visual changes, runny nose, sore throat, mouth sores or tenderness. Lungs: No shortness of breath or cough.  No hemoptysis. Cardiac:  No chest pain, palpitations, orthopnea, or PND. GI:  No nausea, vomiting, diarrhea, constipation, melena or hematochezia. GU:  No urgency, frequency, dysuria, or hematuria. Musculoskeletal:  No back or joint pain.  No muscle tenderness. Extremities:  Left leg discomfort.  Mild upper thigh swelling (no change). Skin:  Left thigh skin lesions (no change). Neuro:  No headache, numbness or weakness, balance or coordination issues. Endocrine:  No diabetes, thyroid issues, hot flashes or night sweats. Psych:  No mood  changes, depression or anxiety. Pain: Left leg pain well controlled. Review of systems:  All other systems reviewed and found to be negative.  Physical Exam: Blood pressure (!) 139/94, pulse 93, temperature 98.3 F (36.8 C), temperature source Oral, resp. rate 20, height 6' 3"  (1.905 m), weight 255 lb 11.2 oz (116 kg), SpO2 98 %.  GENERAL:  Well developed, well nourished,young man sitting comfortably in the exam room in no acute distress. MENTAL STATUS:  Alert and oriented to person, place and time. HEAD:  Alopecia totalis.  Normocephalic, atraumatic, face symmetric, no Cushingoid features. EYES:  Brown eyes.  Pupils equal round and reactive to light and accomodation.  No conjunctivitis or scleral icterus. ENT:  Oropharynx clear without lesion.  Tongue normal. Mucous membranes moist.  RESPIRATORY:  Clear to auscultation without rales, wheezes or rhonchi. CARDIOVASCULAR:  Regular rate and rhythm without murmur, rub or gallop. ABDOMEN:  Soft, non-tender, with active bowel sounds, and no hepatosplenomegaly.  No masses. SKIN:  Left thigh lesions not visualized (wrapped). EXTREMITIES: Left upper leg wrapped with Ace bandage.  Mild upper thigh edema.  No palpable cords. NEUROLOGICAL: Unremarkable.  Finger to nose and RAM normal. PSYCH:  Appropriate.   Results for orders placed or performed during the hospital encounter of 10/21/17 (from the past 48 hour(s))  CBC with Differential     Status: Abnormal   Collection Time: 10/24/17  5:43 AM  Result Value Ref Range   WBC 3.0 (L) 3.8 - 10.6 K/uL   RBC 3.02 (L) 4.40 - 5.90  MIL/uL   Hemoglobin 9.4 (L) 13.0 - 18.0 g/dL   HCT 27.7 (L) 40.0 - 52.0 %   MCV 91.7 80.0 - 100.0 fL   MCH 31.2 26.0 - 34.0 pg   MCHC 34.0 32.0 - 36.0 g/dL   RDW 15.5 (H) 11.5 - 14.5 %   Platelets 456 (H) 150 - 440 K/uL   Neutrophils Relative % 92 %   Neutro Abs 2.7 1.4 - 6.5 K/uL   Lymphocytes Relative 6 %   Lymphs Abs 0.2 (L) 1.0 - 3.6 K/uL   Monocytes Relative 2 %    Monocytes Absolute 0.1 (L) 0.2 - 1.0 K/uL   Eosinophils Relative 0 %   Eosinophils Absolute 0.0 0 - 0.7 K/uL   Basophils Relative 0 %   Basophils Absolute 0.0 0 - 0.1 K/uL    Comment: Performed at [facility], [address] [place], [place] [address]  Phosphorus     Status: None   Collection Time: 10/24/17  5:43 AM  Result Value Ref Range   Phosphorus 3.6 2.5 - 4.6 mg/dL    Comment: Performed at [facility], [address] [place], [place] [address]  Urinalysis, Complete w Microscopic     Status: Abnormal   Collection Time: 10/24/17  8:13 AM  Result Value Ref Range   Color, Urine STRAW (A) YELLOW   APPearance CLEAR (A) CLEAR   Specific Gravity, Urine 1.013 1.005 - 1.030   pH 6.0 5.0 - 8.0   Glucose, UA NEGATIVE NEGATIVE mg/dL   Hgb urine dipstick NEGATIVE NEGATIVE   Bilirubin Urine NEGATIVE NEGATIVE   Ketones, ur 80 (A) NEGATIVE mg/dL   Protein, ur NEGATIVE NEGATIVE mg/dL   Nitrite NEGATIVE NEGATIVE   Leukocytes, UA NEGATIVE NEGATIVE   WBC, UA 0-5 0 - 5 WBC/hpf   Bacteria, UA NONE SEEN NONE SEEN   Squamous Epithelial / LPF NONE SEEN 0 - 5   Mucus PRESENT     Comment: Performed at [facility], [address] [place], [place] [address]  CBC with Differential     Status: Abnormal   Collection Time: 10/25/17  3:42 AM  Result Value Ref Range   WBC 3.9 3.8 - 10.6 K/uL   RBC 3.12 (L) 4.40 - 5.90 MIL/uL   Hemoglobin 9.7 (L) 13.0 - 18.0 g/dL   HCT 28.7 (L) 40.0 - 52.0 %   MCV 91.8 80.0 - 100.0 fL   MCH 31.0 26.0 - 34.0 pg   MCHC 33.8 32.0 - 36.0 g/dL   RDW 15.3 (H) 11.5 - 14.5 %   Platelets 422 150 - 440 K/uL   Neutrophils Relative % 91 %   Neutro Abs 3.5 1.4 - 6.5 K/uL   Lymphocytes Relative 7 %   Lymphs Abs 0.3 (L) 1.0 - 3.6 K/uL   Monocytes Relative 1 %   Monocytes Absolute 0.0 (L) 0.2 - 1.0 K/uL   Eosinophils Relative 0 %   Eosinophils Absolute 0.0 0 - 0.7 K/uL   Basophils Relative 1 %   Basophils Absolute 0.0 0 - 0.1 K/uL     Comment: Performed at [facility], [address] [place], [place] [address]  Phosphorus     Status: None   Collection Time: 10/25/17  3:42 AM  Result Value Ref Range   Phosphorus 3.1 2.5 - 4.6 mg/dL    Comment: Performed at [facility], [address] [place],  [address]  Urinalysis, Complete w Microscopic     Status: Abnormal  Collection Time: 10/25/17  3:42 AM  Result Value Ref Range   Color, Urine COLORLESS (A) YELLOW   APPearance CLEAR (A) CLEAR   Specific Gravity, Urine 1.004 (L) 1.005 - 1.030   pH 6.0 5.0 - 8.0   Glucose, UA NEGATIVE NEGATIVE mg/dL   Hgb urine dipstick NEGATIVE NEGATIVE   Bilirubin Urine NEGATIVE NEGATIVE   Ketones, ur 80 (A) NEGATIVE mg/dL   Protein, ur NEGATIVE NEGATIVE mg/dL   Nitrite NEGATIVE NEGATIVE   Leukocytes, UA NEGATIVE NEGATIVE   RBC / HPF 0-5 0 - 5 RBC/hpf   WBC, UA 0-5 0 - 5 WBC/hpf   Bacteria, UA NONE SEEN NONE SEEN   Squamous Epithelial / LPF NONE SEEN 0 - 5    Comment: Performed at [facility], [address] [place], [place] [address]  Comprehensive metabolic panel     Status: Abnormal   Collection Time: 10/25/17  3:42 AM  Result Value Ref Range   Sodium 136 135 - 145 mmol/L   Potassium 3.8 3.5 - 5.1 mmol/L   Chloride 105 98 - 111 mmol/L   CO2 26 22 - 32 mmol/L   Glucose, Bld 127 (H) 70 - 99 mg/dL   BUN 15 6 - 20 mg/dL   Creatinine, Ser 0.65 0.61 - 1.24 mg/dL   Calcium 8.6 (L) 8.9 - 10.3 mg/dL   Total Protein 6.0 (L) 6.5 - 8.1 g/dL   Albumin 3.2 (L) 3.5 - 5.0 g/dL   AST 20 15 - 41 U/L   ALT 18 0 - 44 U/L   Alkaline Phosphatase 34 (L) 38 - 126 U/L   Total Bilirubin 0.7 0.3 - 1.2 mg/dL   GFR calc non Af Amer >60 >60 mL/min   GFR calc Af Amer >60 >60 mL/min    Comment: (NOTE) The eGFR has been calculated using the CKD EPI equation. This calculation has not been validated in all clinical situations. eGFR's persistently <60 mL/min signify possible Chronic Kidney Disease.    Anion gap 5 5  - 15    Comment: Performed at [facility], [address] [place], [place] [address]  Magnesium     Status: None   Collection Time: 10/25/17  3:42 AM  Result Value Ref Range   Magnesium 2.1 1.7 - 2.4 mg/dL    Comment: Performed at [facility], [address] [place], [place] [address]   No results found.  Assessment:  Edgar [name] is a 28 y.o. male with with recurrent testicular cancer who was admitted for cycle #2 TIP chemotherapy.  He has a history of LLE DVT. He developed left lower cellulitis and venous congestion syndrome with necrotic skin lesions. He is s/p IVC filterplacement, percutaneous angioplastyand stentplacementof the left common iliac vein and left external iliac vein on 10/13/2017.  Symptomatically, he is doing well.  He denies any complaints.  Exam is stable.  Plan:   1.  Oncology:  Cycle #2 TIP chemotherapy.  Day 5 chemotherapy starts late tonight.  Paclitaxel 250 mg/m2 CI over 24 hours on Day 1-complete.  Mesna 500 mg/m2 over 15 minutes before ifosfamide, then at 4 hours and 8 hours from the start of each ifosfamide dose on day 2-5.  Ifosfamide 1500 mg/m2 over 1 hour daily on days 2-5.  Cisplatin 25 mg/m2 over 60 minutes on days 2-5.  Hydration pre and post ifosfamide and cisplatin.  Daily lab check: CBC with diff, CMP, Mg, phosphorus, urinalysis.  Neuro checks daily.  Symptomatically, he is doing  well.  Nausea well controlled.  Neurologic exam normal.  No hematuria.  2.   Hematology:  Left lower extremity DVT s/p stent placement.  Continue Xarelto. Counts adequate.  Anticipate Udencya in the outpatient department in AM on 10/27/2017.  3.   Infectious disease:  Recent history of left lower extremity cellulitis s/p 9 days of vancomycin and Cefepime.  No positive cultures.  Spoke with Dr Carlyle Basques, ID in [place] on Friday, 10/22/2017.  Plan for doxycycline 100 mg BID x 7 days.  Dr. Neville Route, new infectious  disease physician, requested to see patient prior to discharge.  4.   Dermatology:  Continue wound care and dressing changes to left lower extremity as inpatient then transition back to outpatient.  5.   Fluids, Electrolyes and Nutrition:  Patient eating and drinking well.  Electrolytes stable.  No hemorrhagic cystitis.  6.   Pain and Toxicology:  Left thigh pain controlled with Oxycontin 10 mg po q 12 hours and oxycodone 10 mg po q 6 hours prn pain.  He typically takes his breakthrough pain medication twice a day.  7.   Disposition:  Expect discharge home tomorrow morning.  He will receive Udencya in [facility] on 10/27/2017.   [name], MD  10/25/2017, 4:29 PM

## 2017-10-26 ENCOUNTER — Inpatient Hospital Stay: Payer: BLUE CROSS/BLUE SHIELD

## 2017-10-26 ENCOUNTER — Telehealth: Payer: Self-pay | Admitting: Hematology and Oncology

## 2017-10-26 ENCOUNTER — Telehealth: Payer: Self-pay | Admitting: *Deleted

## 2017-10-26 ENCOUNTER — Inpatient Hospital Stay: Payer: BLUE CROSS/BLUE SHIELD | Admitting: Hematology and Oncology

## 2017-10-26 ENCOUNTER — Other Ambulatory Visit: Payer: Self-pay | Admitting: Hematology and Oncology

## 2017-10-26 LAB — URINALYSIS, COMPLETE (UACMP) WITH MICROSCOPIC
Bacteria, UA: NONE SEEN
Bilirubin Urine: NEGATIVE
Glucose, UA: 50 mg/dL — AB
Hgb urine dipstick: NEGATIVE
Ketones, ur: 80 mg/dL — AB
Leukocytes, UA: NEGATIVE
Nitrite: NEGATIVE
Protein, ur: NEGATIVE mg/dL
Specific Gravity, Urine: 1.009 (ref 1.005–1.030)
Squamous Epithelial / LPF: NONE SEEN (ref 0–5)
pH: 7 (ref 5.0–8.0)

## 2017-10-26 LAB — COMPREHENSIVE METABOLIC PANEL
ALT: 20 U/L (ref 0–44)
AST: 24 U/L (ref 15–41)
Albumin: 3.4 g/dL — ABNORMAL LOW (ref 3.5–5.0)
Alkaline Phosphatase: 36 U/L — ABNORMAL LOW (ref 38–126)
Anion gap: 4 — ABNORMAL LOW (ref 5–15)
BUN: 16 mg/dL (ref 6–20)
CO2: 27 mmol/L (ref 22–32)
Calcium: 8.8 mg/dL — ABNORMAL LOW (ref 8.9–10.3)
Chloride: 105 mmol/L (ref 98–111)
Creatinine, Ser: 0.76 mg/dL (ref 0.61–1.24)
GFR calc Af Amer: >60 mL/min (ref >60)
GFR calc non Af Amer: >60 mL/min (ref >60)
Glucose, Bld: 163 mg/dL — ABNORMAL HIGH (ref 70–99)
Potassium: 4.3 mmol/L (ref 3.5–5.1)
Sodium: 136 mmol/L (ref 135–145)
Total Bilirubin: 0.9 mg/dL (ref 0.3–1.2)
Total Protein: 6.4 g/dL — ABNORMAL LOW (ref 6.5–8.1)

## 2017-10-26 LAB — CBC WITH DIFFERENTIAL/PLATELET
Basophils Absolute: 0 10*3/uL (ref 0–0.1)
Basophils Relative: 0 %
Eosinophils Absolute: 0 10*3/uL (ref 0–0.7)
Eosinophils Relative: 0 %
HCT: 28 % — ABNORMAL LOW (ref 40.0–52.0)
Hemoglobin: 10 g/dL — ABNORMAL LOW (ref 13.0–18.0)
Lymphocytes Relative: 3 %
Lymphs Abs: 0.1 10*3/uL — ABNORMAL LOW (ref 1.0–3.6)
MCH: 32.8 pg (ref 26.0–34.0)
MCHC: 35.9 g/dL (ref 32.0–36.0)
MCV: 91.4 fL (ref 80.0–100.0)
Monocytes Absolute: 0 10*3/uL — ABNORMAL LOW (ref 0.2–1.0)
Monocytes Relative: 0 %
Neutro Abs: 4.6 10*3/uL (ref 1.4–6.5)
Neutrophils Relative %: 97 %
Platelets: 371 10*3/uL (ref 150–440)
RBC: 3.06 MIL/uL — ABNORMAL LOW (ref 4.40–5.90)
RDW: 15.5 % — ABNORMAL HIGH (ref 11.5–14.5)
WBC: 4.7 10*3/uL (ref 3.8–10.6)

## 2017-10-26 LAB — MAGNESIUM: Magnesium: 2.2 mg/dL (ref 1.7–2.4)

## 2017-10-26 LAB — PHOSPHORUS: Phosphorus: 3.3 mg/dL (ref 2.5–4.6)

## 2017-10-26 MED ORDER — HEPARIN SOD (PORK) LOCK FLUSH 100 UNIT/ML IV SOLN
500.0000 [IU] | Freq: Once | INTRAVENOUS | Status: AC
  Administered 2017-10-26: 500 [IU] via INTRAVENOUS
  Filled 2017-10-26: qty 5

## 2017-10-26 MED ORDER — OXYCODONE HCL 10 MG PO TABS: 10.0000 mg | ORAL_TABLET | Freq: Four times a day (QID) | ORAL | 0 refills | Status: DC | PRN

## 2017-10-26 NOTE — Telephone Encounter (Signed)
Appts confirmed with Ramda Sharlett Iles (patient's Girlfriend). Advised to p/u copy of appt schd w additional appts, when patient comes to [facility] appt on 10/27/17. Appt schd printed and placed in envelope.  Patient will ask for Merleen Nicely for appt sch/envelope.

## 2017-10-26 NOTE — Consult Note (Addendum)
Date of Admission:  10/21/2017            Reason for Consult: left thigh skin lesions/cellulitis   Referring Provider:Melissa Corcoran MD  History obtained from aptient and reviewed care every where records from [facility] HPI: Edgar [name] is a 27 y.o. male with h/o testicular cancer diagnosed in sept  2017 s/p left orchidectomys/p 3 cycles of BEP chemo , recurrence in June 2019, started TIP  Is admitted to [facility] for Cycle 3 of TIP. Pt on  June 28th  went to [facility] with swelling of left leg and workup  revealed retroperitoneal adenopathy causing compression of the left iliac vein and associated thrombus He got IV heparin and discharged on xarelto. He started first cycle of TIP (paclitaxel/ifosfamide/cisplatin)  on 09/21/17- a 5 day cycle every 21 days. He took Decadron x 3 days after chemo.  On day 7 (09/27/2017), he began Levaquin x 7 days.  He received Udencya on 09/27/2017. He was seen as follow up on 7/22 at [facility] and the note says he had marked swelling of the left leg with pitting edema. On 7/24 patient presented to [facility] ED with fever of 102 and pain left leg, along with worsening swelling. He was diagnosed with cellulitis and sent home on clindamycin. Blood culture was negative. He saw Dr.Corcoran ( onc) on July 26th and she noted blistering lesions on his left leg and admitted him to the hopsital for Iv antibiotics   He received IV vanco and cefepime. On 10/13/17 he underwent the following by Vascular surgery 1. Ultrasound guidance for vascular access to theright common femoral vein and left popliteal vein 2. Catheter placement into the inferior vena cava for placement of IVC filter 3. Inferior venacavogram 4. Placement of a Denali IVC filter infrarenal 5. Introduction catheter into venous system second order catheter placement left popliteal approach 6.Percutaneous transluminal angioplasty and stent placement of the left common iliac vein and left external iliac  vein 7. Percutaneous transluminal angioplasty of the left common femoral vein 8.   Mechanical thrombectomy of the left common femoral, external iliac and common iliac veins with the penumbra cat 8 device 9.   Excisional debridement of necrotic skin and dermis multiple blistered ulcerations left thigh  He improved significantly after that and was sent home on 10/16/17 on doxy, levaquin and flagyl.and xarelto. The wound on the leg was being treated with silversulphadiazine and xeroform.  He has been admitted for 2nd cycle of TIP on 10/21/17 and received 5 days of chemo. He is doing well I am asked to see him to review the left leg He has no fever or chills Pain leg much better Swelling much improved    Past Medical History:  Diagnosis Date  . Asthma    AS A CHILD-NO INHALERS  . Cancer ([place])    testicular cancer 11/2016 per pt   . DVT (deep venous thrombosis) ([place])   . GERD (gastroesophageal reflux disease)    TUMS PRN    Past Surgical History:  Procedure Laterality Date  . ANKLE FRACTURE SURGERY Right 2004  . ORCHIECTOMY Left 11/26/2015   Procedure: ORCHIECTOMY;  Surgeon: [name], MD;  Location: [facility] ORS;  Service: Urology;  Laterality: Left;  radical/Inguinal approach  . PERIPHERAL VASCULAR CATHETERIZATION N/A 12/16/2015   Procedure: [name] Cath Insertion;  Surgeon: [name], MD;  Location: [place] CV LAB;  Service: Cardiovascular;  Laterality: N/A;  . PERIPHERAL VASCULAR THROMBECTOMY Left 10/13/2017   Procedure: PERIPHERAL VASCULAR  THROMBECTOMY;  Surgeon: [name], MD;  Location: [place] [facility];  Service: Cardiovascular;  Laterality: Left;  . WISDOM TOOTH EXTRACTION  2017    Social History   Tobacco Use  . Smoking status: Current Every Day Smoker    Packs/day: 0.50    Years: 8.00    Pack years: 4.00    Types: Cigarettes  . Smokeless tobacco: Former Systems developer    Types: Snuff  Substance Use Topics  . Alcohol use: No  . Drug use: Yes    Types:  Marijuana    Family History  Problem Relation Age of Onset  . Skin cancer Mother   . Breast cancer Maternal Grandmother   . Colon cancer Maternal Grandfather   . Diabetes Maternal Grandfather   . Emphysema Father   . Mental illness Sister        anxiety  . Stroke Paternal Grandmother   . Prostate cancer Neg Hx   . Kidney cancer Neg Hx   . Bladder Cancer Neg Hx      Current Medications  . dexamethasone  4 mg Oral Daily  . doxycycline  100 mg Oral Q12H  . DULoxetine  60 mg Oral Daily  . gabapentin  300 mg Oral QHS  . magnesium oxide  400 mg Oral Daily  . nicotine  21 mg Transdermal Daily  . oxyCODONE  10 mg Oral Q12H  . rivaroxaban  20 mg Oral Daily  . silver sulfADIAZINE  1 application Topical BID    Abtx:  Anti-infectives (From admission, onward)   Start     Dose/Rate Route Frequency Ordered Stop   10/22/17 2200  doxycycline (VIBRA-TABS) tablet 100 mg     100 mg Oral Every 12 hours 10/22/17 1801 10/26/17 2359   10/21/17 1230  doxycycline (VIBRA-TABS) tablet 100 mg  Status:  Discontinued     100 mg Oral 2 times daily 10/21/17 1110 10/22/17 1143   10/21/17 1230  levofloxacin (LEVAQUIN) tablet 500 mg  Status:  Discontinued     500 mg Oral Daily 10/21/17 1110 10/22/17 1143   10/21/17 1230  metroNIDAZOLE (FLAGYL) tablet 500 mg  Status:  Discontinued     500 mg Oral 3 times daily 10/21/17 1110 10/22/17 1143       Review of Systems: Constitutional-fatigue, no fever or weight loss Eyes- no blurred vision ENT- no sore throat, or nasal congestion CVS- no chest pain or SOB RS- no cough, no wheeze GI- some lower abdominal pain No constipation or diarrhea GU- no hematuria or dysuria MSK- swelling left leg muich better Skin- lesions on  left thigh improving Neurologic- no headache or dizziness Lymphatic- edema left leg better Psychiatric- mood stable       OBJECTIVE: Blood pressure (!) 130/95, pulse 79, temperature 97.8 F (36.6 C), temperature source Oral, resp.  rate 16, height 6\' 3"  (1.905 m), weight 116 kg, SpO2 95 %.  Physical Exam  Constitutional: He is oriented to person, place, and time. He appears well-developed. No distress.  HENT:  Head: Normocephalic.  Mouth/Throat: Oropharynx is clear and moist.  Eyes: Pupils are equal, round, and reactive to light.  Neck: Neck supple.  Cardiovascular: Normal rate, regular rhythm and intact distal pulses.  No murmur heard. Pulmonary/Chest: Breath sounds normal. He has no wheezes. He has no rales.  Abdominal: Soft. There is no tenderness.  Musculoskeletal: He exhibits edema (left leg).  Lymphadenopathy:    He has no cervical adenopathy.  Neurological: He is alert and oriented to person,  place, and time. No cranial nerve deficit.  Skin: Skin is warm.  Psychiatric: He has a normal mood and affect.   skin Left leg - 4 areas of round healing superficial lesions like burns on his left thigh. No erythema or signs of infection  10/26/17    8/6  Lab Results WBC  Results for Edgar, [name] (MRN [id number]) as of 10/26/2017 12:06  Ref. Range 10/26/2017 06:29  WBC Latest Ref Range: 3.8 - 10.6 K/uL 4.7  RBC Latest Ref Range: 4.40 - 5.90 MIL/uL 3.06 (L)  Hemoglobin Latest Ref Range: 13.0 - 18.0 g/dL 10.0 (L)  HCT Latest Ref Range: 40.0 - 52.0 % 28.0 (L)  MCV Latest Ref Range: 80.0 - 100.0 fL 91.4  MCH Latest Ref Range: 26.0 - 34.0 pg 32.8  MCHC Latest Ref Range: 32.0 - 36.0 g/dL 35.9  RDW Latest Ref Range: 11.5 - 14.5 % 15.5 (H)  Platelets Latest Ref Range: 150 - 440 K/uL 371   Results for Edgar, [name] (MRN [id number]) as of 10/26/2017 12:06  Ref. Range 10/26/2017 06:29  Sodium Latest Ref Range: 135 - 145 mmol/L 136  Potassium Latest Ref Range: 3.5 - 5.1 mmol/L 4.3  Chloride Latest Ref Range: 98 - 111 mmol/L 105  CO2 Latest Ref Range: 22 - 32 mmol/L 27  Glucose Latest Ref Range: 70 - 99 mg/dL 163 (H)  BUN Latest Ref Range: 6 - 20 mg/dL 16  Creatinine Latest Ref Range: 0.61 - 1.24 mg/dL 0.76    Calcium Latest Ref Range: 8.9 - 10.3 mg/dL 8.8 (L)  Anion gap Latest Ref Range: 5 - 15  4 (L)  Phosphorus Latest Ref Range: 2.5 - 4.6 mg/dL 3.3  Magnesium Latest Ref Range: 1.7 - 2.4 mg/dL 2.2  Alkaline Phosphatase Latest Ref Range: 38 - 126 U/L 36 (L)  Albumin Latest Ref Range: 3.5 - 5.0 g/dL 3.4 (L)  AST Latest Ref Range: 15 - 41 U/L 24  ALT Latest Ref Range: 0 - 44 U/L 20  Total Protein Latest Ref Range: 6.5 - 8.1 g/dL 6.4 (L)  Total Bilirubin Latest Ref Range: 0.3 - 1.2 mg/dL 0.9  GFR, Est Non African American Latest Ref Range: >60 mL/min >60  GFR, Est African American Latest Ref Range: >60 mL/min >60   Radiology No films to review this admission.   Assessment: Edgar [name] is a 27 y.o. male with h/o testicular cancer diagnosed in sept  2017 s/p left orchidectomys/p 3 cycles of BEP chemo , recurrence in June 2019, started TIP Is admitted to [facility] for Cycle 3 of TIP.I am asked to see him for skin lesions  1) healing superficial burns like lesions on the left thigh .Interesting picture- he had Swelling of the left leg due to DVT from the retroperitoneal LN compressing on his veins- he then developed blistering circular lesions on his thighs 2 weeks after his first cycle of chemo and has been treated with antibiotics. No cultures were sent form the lesions before D.D Bullous impetigo, VS Fixed drug eruption  VS bullous pemphigoid secondary to chemo VS phlegmasia cerulea dolens  It is unusual for venous edema /congestion to cause circular lesions.  Could be that he had severe leg swelling and poor venous return and chemo agents were saturating the tissue for a longer period of time causing fixed drug reaction??  Plan: He has been adequately treated with antibiotics and he does not need any more antibiotics as there is no sign of ongoing  infection now.  Continue topical care  Observe closely for recurrence of these lesions with future chemo   *testicular carcinoma s/p left  orchidectomy, recurrence of TIP  *Left DVT due to malignancy s/p angio. Stent and on xarelto  Discussed with patient in great detail. Spoke to Dr. Bridgett Larsson  Thank you so much for this interesting consult.    Tsosie Billing, MD  10/26/2017, 10:04 AM

## 2017-10-26 NOTE — Plan of Care (Signed)
  Problem: Education: Goal: Knowledge of General Education information will improve Description Including pain rating scale, medication(s)/side effects and non-pharmacologic comfort measures Outcome: Progressing   Problem: Health Behavior/Discharge Planning: Goal: Ability to manage health-related needs will improve Outcome: Progressing   Problem: Clinical Measurements: Goal: Ability to maintain clinical measurements within normal limits will improve Outcome: Progressing Goal: Will remain free from infection Outcome: Progressing Goal: Diagnostic test results will improve Outcome: Progressing Goal: Respiratory complications will improve Outcome: Progressing Goal: Cardiovascular complication will be avoided Outcome: Progressing   Problem: Activity: Goal: Risk for activity intolerance will decrease Outcome: Progressing   Problem: Nutrition: Goal: Adequate nutrition will be maintained Outcome: Progressing   Problem: Coping: Goal: Level of anxiety will decrease Outcome: Progressing   Problem: Elimination: Goal: Will not experience complications related to bowel motility Outcome: Progressing Goal: Will not experience complications related to urinary retention Outcome: Progressing   Problem: Pain Managment: Goal: General experience of comfort will improve Outcome: Progressing   Problem: Safety: Goal: Ability to remain free from injury will improve Outcome: Progressing   Problem: Skin Integrity: Goal: Risk for impaired skin integrity will decrease Outcome: Progressing   Problem: Education: Goal: Knowledge of the prescribed therapeutic regimen will improve Outcome: Progressing   Problem: Activity: Goal: Ability to perform activities at highest level will improve Outcome: Progressing   Problem: Nutritional: Goal: Maintenance of adequate nutrition will improve Outcome: Progressing   Problem: Clinical Measurements: Goal: Will remain free from infection Outcome:  Progressing   Problem: Skin Integrity: Goal: Status of oral mucous membranes will improve Outcome: Progressing

## 2017-10-26 NOTE — Discharge Summary (Signed)
[facility] [place] NAME: Edgar [name]    MR#:  [id number]  DATE OF BIRTH:  March 12, 1991  DATE OF ADMISSION:  10/21/2017   ADMITTING PHYSICIAN: Vaughan Basta, MD  DATE OF DISCHARGE:  10/26/2017  PRIMARY CARE PHYSICIAN: Patient, No Pcp Per   ADMISSION DIAGNOSIS:  Testicular cancer for chemo DISCHARGE DIAGNOSIS:  Active Problems:   Scrotal cancer ([place])  SECONDARY DIAGNOSIS:   Past Medical History:  Diagnosis Date  . Asthma    AS A CHILD-NO INHALERS  . Cancer ([place])    testicular cancer 11/2016 per pt   . DVT (deep venous thrombosis) ([place])   . GERD (gastroesophageal reflux disease)    TUMS PRN   HOSPITAL COURSE:  [name]  is a 27 y.o. male with a known history of testicular cancer, gastroesophageal reflux disease, deep vein thrombosis-was admitted for chemotherapy.   Scrotal cancer [facility])  *Testicular cancer Chemotherapy is completed. CBC is normal.  Leukopenia.  improved.  *DVT Continue Xarelto.  *Left thigh cellulitis As per last discharge, he was supposed to continue antibiotics until 13 August, Pt received 10 days Abx in hospital, and 7 days since then. His cellulitis resolved. He did not have bacteremia and cx were negative.  On local wound care. No need for more abx per Dr. Donette Larry.  * Smoking Counseled to quit smoking for 4 minutes and offered nicotine patch.  I discussed with Dr. Donette Larry. DISCHARGE CONDITIONS:  Stable, discharge to home today. CONSULTS OBTAINED:  Treatment Team:  Tsosie Billing, MD DRUG ALLERGIES:  No Known Allergies DISCHARGE MEDICATIONS:   Allergies as of 10/26/2017   No Known Allergies     Medication List    STOP taking these medications   doxycycline 100 MG tablet Commonly known as:  VIBRA-TABS   levofloxacin 500 MG tablet Commonly known as:  LEVAQUIN   metroNIDAZOLE 500 MG tablet Commonly known as:  FLAGYL     TAKE these medications     acetaminophen 500 MG tablet Commonly known as:  TYLENOL Take 500-1,000 mg by mouth every 6 (six) hours as needed for mild pain or fever.   CYMBALTA 60 MG capsule Generic drug:  DULoxetine Take 1 tablet by mouth daily.   dexamethasone 4 MG tablet Commonly known as:  DECADRON Take 4 mg by mouth daily.   gabapentin 300 MG capsule Commonly known as:  NEURONTIN Take 1 capsule (300 mg total) by mouth at bedtime.   magnesium oxide 400 MG tablet Commonly known as:  MAG-OX Take 1 tablet by mouth daily.   nicotine 7 mg/24hr patch Commonly known as:  NICODERM CQ - dosed in mg/24 hr Place 7 mg onto the skin daily.   ondansetron 4 MG disintegrating tablet Commonly known as:  ZOFRAN-ODT Take 2 tablets by mouth every 8 (eight) hours as needed.   Oxycodone HCl 10 MG Tabs Take 1 tablet (10 mg total) by mouth every 6 (six) hours as needed for up to 4 days (moderate and severe pain). What changed:  Another medication with the same name was removed. Continue taking this medication, and follow the directions you see here.   polyethylene glycol packet Commonly known as:  MIRALAX / GLYCOLAX Take 17 g by mouth daily as needed for mild constipation.   potassium chloride 20 MEQ packet Commonly known as:  KLOR-CON Take 20 mEq by mouth 2 (two) times daily.   prochlorperazine 10 MG tablet Commonly known as:  COMPAZINE Take 1 tablet by mouth  every 6 (six) hours as needed (nausea/vomiting).   silver sulfADIAZINE 1 % cream Commonly known as:  SILVADENE Apply topically 2 (two) times daily. Apply to blisters on left thigh and cover with nonadherent dressing   XARELTO 20 MG Tabs tablet Generic drug:  rivaroxaban Take 1 tablet by mouth daily.        DISCHARGE INSTRUCTIONS:  See AVS.  If you experience worsening of your admission symptoms, develop shortness of breath, life threatening emergency, suicidal or homicidal thoughts you must seek medical attention immediately by calling 911 or  calling your MD immediately  if symptoms less severe.  You Must read complete instructions/literature along with all the possible adverse reactions/side effects for all the Medicines you take and that have been prescribed to you. Take any new Medicines after you have completely understood and accpet all the possible adverse reactions/side effects.   Please note  You were cared for by a hospitalist during your hospital stay. If you have any questions about your discharge medications or the care you received while you were in the hospital after you are discharged, you can call the unit and asked to speak with the hospitalist on call if the hospitalist that took care of you is not available. Once you are discharged, your primary care physician will handle any further medical issues. Please note that NO REFILLS for any discharge medications will be authorized once you are discharged, as it is imperative that you return to your primary care physician (or establish a relationship with a primary care physician if you do not have one) for your aftercare needs so that they can reassess your need for medications and monitor your lab values.    On the day of Discharge:  VITAL SIGNS:  Blood pressure (!) 130/95, pulse 79, temperature 97.8 F (36.6 C), temperature source Oral, resp. rate 16, height 6\' 3"  (1.905 m), weight 116 kg, SpO2 95 %. PHYSICAL EXAMINATION:  GENERAL:  27 y.o.-year-old patient lying in the bed with no acute distress.  EYES: Pupils equal, round, reactive to light and accommodation. No scleral icterus. Extraocular muscles intact.  HEENT: Head atraumatic, normocephalic. Oropharynx and nasopharynx clear.  NECK:  Supple, no jugular venous distention. No thyroid enlargement, no tenderness.  LUNGS: Normal breath sounds bilaterally, no wheezing, rales,rhonchi or crepitation. No use of accessory muscles of respiration.  CARDIOVASCULAR: S1, S2 normal. No murmurs, rubs, or gallops.  ABDOMEN: Soft,  non-tender, non-distended. Bowel sounds present. No organomegaly or mass.  EXTREMITIES: No pedal edema, cyanosis, or clubbing. Healing blisters on left thigh. NEUROLOGIC: Cranial nerves II through XII are intact. Muscle strength 5/5 in all extremities. Sensation intact. Gait not checked.  PSYCHIATRIC: The patient is alert and oriented x 3.  SKIN: No obvious rash, lesion, or ulcer.  DATA REVIEW:   CBC Recent Labs  Lab 10/26/17 0629  WBC 4.7  HGB 10.0*  HCT 28.0*  PLT 371    Chemistries  Recent Labs  Lab 10/26/17 0629  NA 136  K 4.3  CL 105  CO2 27  GLUCOSE 163*  BUN 16  CREATININE 0.76  CALCIUM 8.8*  MG 2.2  AST 24  ALT 20  ALKPHOS 36*  BILITOT 0.9     Microbiology Results  Results for orders placed or performed during the hospital encounter of 10/08/17  C difficile quick scan w PCR reflex     Status: None   Collection Time: 10/08/17  6:30 PM  Result Value Ref Range Status   C Diff antigen  NEGATIVE NEGATIVE Final   C Diff toxin NEGATIVE NEGATIVE Final   C Diff interpretation No C. difficile detected.  Final    Comment: Performed at [facility], [address] [place], [place] [address]    RADIOLOGY:  No results found.   Management plans discussed with the patient, family and they are in agreement.  CODE STATUS: Full Code   TOTAL TIME TAKING CARE OF THIS PATIENT: 35 minutes.    Demetrios Loll M.D on 10/26/2017 at 1:19 PM  Between 7am to 6pm - Pager - [phone]  After 6pm go to [email or web address] - Proofreader  [facility]  Office  [phone]  CC: Primary care physician; Patient, No Pcp Per   Note: This dictation was prepared with Dragon dictation along with smaller phrase technology. Any transcriptional errors that result from this process are unintentional.

## 2017-10-26 NOTE — Telephone Encounter (Signed)
Called patient's mother, Manuela Schwartz,  to give her a breakdown of upcoming appointments for [facility].  Informed her that scheduling will give her a call later to give her the appointment times.

## 2017-10-26 NOTE — Telephone Encounter (Signed)
-----   Message from Karen Kitchens, NP sent at 10/26/2017  1:27 PM EDT ----- Regarding: Needs Inpatient being discharged tonight. Needs the following:  1. RTC on 08/14 for Udencya injection 2. RTC on 08/16 for NP assess, labs (CBC/w,CMP) and +/- IVFs 3. RTC on 08/19 for +/- IVFs. Will need to call if he doesn't need.  4. RTC on 08/23 for MD assess, labs (CBC/w,CMP,Mg), and +/- IVFs  If you can print out new appt list, we will deliver when seen on the floor today.   Thanks,  Gaspar Bidding

## 2017-10-27 ENCOUNTER — Inpatient Hospital Stay: Payer: BLUE CROSS/BLUE SHIELD

## 2017-10-27 ENCOUNTER — Other Ambulatory Visit (INDEPENDENT_AMBULATORY_CARE_PROVIDER_SITE_OTHER): Payer: Self-pay

## 2017-10-27 ENCOUNTER — Telehealth: Payer: Self-pay | Admitting: *Deleted

## 2017-10-27 ENCOUNTER — Telehealth (INDEPENDENT_AMBULATORY_CARE_PROVIDER_SITE_OTHER): Payer: Self-pay

## 2017-10-27 DIAGNOSIS — C6212 Malignant neoplasm of descended left testis: Secondary | ICD-10-CM | POA: Diagnosis not present

## 2017-10-27 DIAGNOSIS — T451X5A Adverse effect of antineoplastic and immunosuppressive drugs, initial encounter: Secondary | ICD-10-CM | POA: Diagnosis not present

## 2017-10-27 DIAGNOSIS — S91331A Puncture wound without foreign body, right foot, initial encounter: Secondary | ICD-10-CM | POA: Diagnosis not present

## 2017-10-27 DIAGNOSIS — B009 Herpesviral infection, unspecified: Secondary | ICD-10-CM | POA: Diagnosis not present

## 2017-10-27 DIAGNOSIS — L982 Febrile neutrophilic dermatosis [Sweet]: Secondary | ICD-10-CM | POA: Diagnosis not present

## 2017-10-27 DIAGNOSIS — F321 Major depressive disorder, single episode, moderate: Secondary | ICD-10-CM | POA: Diagnosis not present

## 2017-10-27 DIAGNOSIS — R000 Tachycardia, unspecified: Secondary | ICD-10-CM | POA: Diagnosis not present

## 2017-10-27 DIAGNOSIS — D649 Anemia, unspecified: Secondary | ICD-10-CM | POA: Diagnosis not present

## 2017-10-27 DIAGNOSIS — J45909 Unspecified asthma, uncomplicated: Secondary | ICD-10-CM | POA: Diagnosis not present

## 2017-10-27 DIAGNOSIS — Z800 Family history of malignant neoplasm of digestive organs: Secondary | ICD-10-CM | POA: Diagnosis not present

## 2017-10-27 DIAGNOSIS — Z9181 History of falling: Secondary | ICD-10-CM | POA: Diagnosis not present

## 2017-10-27 DIAGNOSIS — F329 Major depressive disorder, single episode, unspecified: Secondary | ICD-10-CM | POA: Diagnosis not present

## 2017-10-27 DIAGNOSIS — L988 Other specified disorders of the skin and subcutaneous tissue: Secondary | ICD-10-CM | POA: Diagnosis not present

## 2017-10-27 DIAGNOSIS — W450XXA Nail entering through skin, initial encounter: Secondary | ICD-10-CM | POA: Diagnosis not present

## 2017-10-27 DIAGNOSIS — C6290 Malignant neoplasm of unspecified testis, unspecified whether descended or undescended: Secondary | ICD-10-CM | POA: Diagnosis not present

## 2017-10-27 DIAGNOSIS — Z803 Family history of malignant neoplasm of breast: Secondary | ICD-10-CM | POA: Diagnosis not present

## 2017-10-27 DIAGNOSIS — Z792 Long term (current) use of antibiotics: Secondary | ICD-10-CM | POA: Diagnosis not present

## 2017-10-27 DIAGNOSIS — K219 Gastro-esophageal reflux disease without esophagitis: Secondary | ICD-10-CM | POA: Diagnosis not present

## 2017-10-27 DIAGNOSIS — I82412 Acute embolism and thrombosis of left femoral vein: Secondary | ICD-10-CM | POA: Diagnosis not present

## 2017-10-27 DIAGNOSIS — L03116 Cellulitis of left lower limb: Secondary | ICD-10-CM | POA: Diagnosis not present

## 2017-10-27 DIAGNOSIS — Z809 Family history of malignant neoplasm, unspecified: Secondary | ICD-10-CM | POA: Diagnosis not present

## 2017-10-27 DIAGNOSIS — Z79891 Long term (current) use of opiate analgesic: Secondary | ICD-10-CM | POA: Diagnosis not present

## 2017-10-27 DIAGNOSIS — I824Y2 Acute embolism and thrombosis of unspecified deep veins of left proximal lower extremity: Secondary | ICD-10-CM | POA: Diagnosis not present

## 2017-10-27 DIAGNOSIS — F17210 Nicotine dependence, cigarettes, uncomplicated: Secondary | ICD-10-CM | POA: Diagnosis not present

## 2017-10-27 DIAGNOSIS — Z7901 Long term (current) use of anticoagulants: Secondary | ICD-10-CM | POA: Diagnosis not present

## 2017-10-27 DIAGNOSIS — M898X9 Other specified disorders of bone, unspecified site: Secondary | ICD-10-CM | POA: Diagnosis not present

## 2017-10-27 DIAGNOSIS — Z86718 Personal history of other venous thrombosis and embolism: Secondary | ICD-10-CM | POA: Diagnosis not present

## 2017-10-27 DIAGNOSIS — Z5111 Encounter for antineoplastic chemotherapy: Secondary | ICD-10-CM | POA: Diagnosis not present

## 2017-10-27 DIAGNOSIS — R432 Parageusia: Secondary | ICD-10-CM | POA: Diagnosis not present

## 2017-10-27 DIAGNOSIS — Z452 Encounter for adjustment and management of vascular access device: Secondary | ICD-10-CM | POA: Diagnosis not present

## 2017-10-27 DIAGNOSIS — Z79899 Other long term (current) drug therapy: Secondary | ICD-10-CM | POA: Diagnosis not present

## 2017-10-27 DIAGNOSIS — G629 Polyneuropathy, unspecified: Secondary | ICD-10-CM | POA: Diagnosis not present

## 2017-10-27 DIAGNOSIS — D6481 Anemia due to antineoplastic chemotherapy: Secondary | ICD-10-CM | POA: Diagnosis not present

## 2017-10-27 MED ORDER — SILVER SULFADIAZINE 1 % EX CREA: TOPICAL_CREAM | Freq: Two times a day (BID) | CUTANEOUS | 4 refills | Status: DC

## 2017-10-27 MED ORDER — PEGFILGRASTIM-CBQV 6 MG/0.6ML ~~LOC~~ SOSY
6.0000 mg | PREFILLED_SYRINGE | Freq: Once | SUBCUTANEOUS | Status: AC
  Administered 2017-10-27: 6 mg via SUBCUTANEOUS

## 2017-10-27 NOTE — Telephone Encounter (Signed)
Patient's Mom called and stated that the patient had a procedure done over two weeks ago and that she is in need of a prescription for "Zero point yellow" wraps, I think she is speaking of Xeroform.   She states that he is in need of this so that his leg wounds can be covered, and that she is having difficulty getting his legs wrapped because she foes not have a prescription for the Xeroform?

## 2017-10-27 NOTE — Telephone Encounter (Signed)
Called patient's mother, Manuela Schwartz, to inquire it patient has decadron and antiemetics.  She states he does.  Advised her per Gaspar Bidding, NP to have patient take the decadron through Saturday and then stop.  Advised anitemetics as needed.  Manuela Schwartz verbalized understanding.

## 2017-10-28 DIAGNOSIS — I824Y2 Acute embolism and thrombosis of unspecified deep veins of left proximal lower extremity: Secondary | ICD-10-CM | POA: Diagnosis not present

## 2017-10-28 DIAGNOSIS — Z7901 Long term (current) use of anticoagulants: Secondary | ICD-10-CM | POA: Diagnosis not present

## 2017-10-28 DIAGNOSIS — L982 Febrile neutrophilic dermatosis [Sweet]: Secondary | ICD-10-CM | POA: Diagnosis not present

## 2017-10-28 DIAGNOSIS — F329 Major depressive disorder, single episode, unspecified: Secondary | ICD-10-CM | POA: Diagnosis not present

## 2017-10-28 DIAGNOSIS — D6481 Anemia due to antineoplastic chemotherapy: Secondary | ICD-10-CM | POA: Diagnosis not present

## 2017-10-28 DIAGNOSIS — C6290 Malignant neoplasm of unspecified testis, unspecified whether descended or undescended: Secondary | ICD-10-CM | POA: Diagnosis not present

## 2017-10-28 DIAGNOSIS — L03116 Cellulitis of left lower limb: Secondary | ICD-10-CM | POA: Diagnosis not present

## 2017-10-28 DIAGNOSIS — Z79891 Long term (current) use of opiate analgesic: Secondary | ICD-10-CM | POA: Diagnosis not present

## 2017-10-28 DIAGNOSIS — Z452 Encounter for adjustment and management of vascular access device: Secondary | ICD-10-CM | POA: Diagnosis not present

## 2017-10-28 DIAGNOSIS — Z792 Long term (current) use of antibiotics: Secondary | ICD-10-CM | POA: Diagnosis not present

## 2017-10-28 DIAGNOSIS — L988 Other specified disorders of the skin and subcutaneous tissue: Secondary | ICD-10-CM | POA: Diagnosis not present

## 2017-10-28 DIAGNOSIS — D649 Anemia, unspecified: Secondary | ICD-10-CM | POA: Diagnosis not present

## 2017-10-28 DIAGNOSIS — F17210 Nicotine dependence, cigarettes, uncomplicated: Secondary | ICD-10-CM | POA: Diagnosis not present

## 2017-10-28 DIAGNOSIS — I82412 Acute embolism and thrombosis of left femoral vein: Secondary | ICD-10-CM | POA: Diagnosis not present

## 2017-10-28 DIAGNOSIS — K219 Gastro-esophageal reflux disease without esophagitis: Secondary | ICD-10-CM | POA: Diagnosis not present

## 2017-10-28 DIAGNOSIS — J45909 Unspecified asthma, uncomplicated: Secondary | ICD-10-CM | POA: Diagnosis not present

## 2017-10-28 DIAGNOSIS — F321 Major depressive disorder, single episode, moderate: Secondary | ICD-10-CM | POA: Diagnosis not present

## 2017-10-28 DIAGNOSIS — Z9181 History of falling: Secondary | ICD-10-CM | POA: Diagnosis not present

## 2017-10-29 ENCOUNTER — Inpatient Hospital Stay (HOSPITAL_BASED_OUTPATIENT_CLINIC_OR_DEPARTMENT_OTHER): Payer: BLUE CROSS/BLUE SHIELD | Admitting: Urgent Care

## 2017-10-29 ENCOUNTER — Other Ambulatory Visit: Payer: Self-pay | Admitting: *Deleted

## 2017-10-29 ENCOUNTER — Inpatient Hospital Stay: Payer: BLUE CROSS/BLUE SHIELD

## 2017-10-29 VITALS — BP 130/85 | HR 108 | Temp 98.1°F | Resp 18 | Wt 258.4 lb

## 2017-10-29 DIAGNOSIS — D6481 Anemia due to antineoplastic chemotherapy: Secondary | ICD-10-CM

## 2017-10-29 DIAGNOSIS — G629 Polyneuropathy, unspecified: Secondary | ICD-10-CM

## 2017-10-29 DIAGNOSIS — F17210 Nicotine dependence, cigarettes, uncomplicated: Secondary | ICD-10-CM | POA: Diagnosis not present

## 2017-10-29 DIAGNOSIS — J45909 Unspecified asthma, uncomplicated: Secondary | ICD-10-CM | POA: Diagnosis not present

## 2017-10-29 DIAGNOSIS — C6290 Malignant neoplasm of unspecified testis, unspecified whether descended or undescended: Secondary | ICD-10-CM | POA: Diagnosis not present

## 2017-10-29 DIAGNOSIS — F321 Major depressive disorder, single episode, moderate: Secondary | ICD-10-CM | POA: Diagnosis not present

## 2017-10-29 DIAGNOSIS — D649 Anemia, unspecified: Secondary | ICD-10-CM | POA: Diagnosis not present

## 2017-10-29 DIAGNOSIS — Z800 Family history of malignant neoplasm of digestive organs: Secondary | ICD-10-CM

## 2017-10-29 DIAGNOSIS — M898X9 Other specified disorders of bone, unspecified site: Secondary | ICD-10-CM

## 2017-10-29 DIAGNOSIS — I82412 Acute embolism and thrombosis of left femoral vein: Secondary | ICD-10-CM | POA: Diagnosis not present

## 2017-10-29 DIAGNOSIS — G620 Drug-induced polyneuropathy: Secondary | ICD-10-CM

## 2017-10-29 DIAGNOSIS — Z9181 History of falling: Secondary | ICD-10-CM | POA: Diagnosis not present

## 2017-10-29 DIAGNOSIS — S91331A Puncture wound without foreign body, right foot, initial encounter: Secondary | ICD-10-CM | POA: Diagnosis not present

## 2017-10-29 DIAGNOSIS — Z803 Family history of malignant neoplasm of breast: Secondary | ICD-10-CM

## 2017-10-29 DIAGNOSIS — R000 Tachycardia, unspecified: Secondary | ICD-10-CM | POA: Diagnosis not present

## 2017-10-29 DIAGNOSIS — F329 Major depressive disorder, single episode, unspecified: Secondary | ICD-10-CM | POA: Diagnosis not present

## 2017-10-29 DIAGNOSIS — T451X5A Adverse effect of antineoplastic and immunosuppressive drugs, initial encounter: Principal | ICD-10-CM

## 2017-10-29 DIAGNOSIS — I824Y2 Acute embolism and thrombosis of unspecified deep veins of left proximal lower extremity: Secondary | ICD-10-CM | POA: Diagnosis not present

## 2017-10-29 DIAGNOSIS — L03116 Cellulitis of left lower limb: Secondary | ICD-10-CM

## 2017-10-29 DIAGNOSIS — L982 Febrile neutrophilic dermatosis [Sweet]: Secondary | ICD-10-CM | POA: Diagnosis not present

## 2017-10-29 DIAGNOSIS — Z79899 Other long term (current) drug therapy: Secondary | ICD-10-CM | POA: Diagnosis not present

## 2017-10-29 DIAGNOSIS — B009 Herpesviral infection, unspecified: Secondary | ICD-10-CM | POA: Diagnosis not present

## 2017-10-29 DIAGNOSIS — Z452 Encounter for adjustment and management of vascular access device: Secondary | ICD-10-CM | POA: Diagnosis not present

## 2017-10-29 DIAGNOSIS — Z7901 Long term (current) use of anticoagulants: Secondary | ICD-10-CM | POA: Diagnosis not present

## 2017-10-29 DIAGNOSIS — Z79891 Long term (current) use of opiate analgesic: Secondary | ICD-10-CM | POA: Diagnosis not present

## 2017-10-29 DIAGNOSIS — K219 Gastro-esophageal reflux disease without esophagitis: Secondary | ICD-10-CM

## 2017-10-29 DIAGNOSIS — R432 Parageusia: Secondary | ICD-10-CM | POA: Diagnosis not present

## 2017-10-29 DIAGNOSIS — Z5111 Encounter for antineoplastic chemotherapy: Secondary | ICD-10-CM | POA: Diagnosis not present

## 2017-10-29 DIAGNOSIS — Z792 Long term (current) use of antibiotics: Secondary | ICD-10-CM | POA: Diagnosis not present

## 2017-10-29 DIAGNOSIS — Z86718 Personal history of other venous thrombosis and embolism: Secondary | ICD-10-CM

## 2017-10-29 DIAGNOSIS — C6212 Malignant neoplasm of descended left testis: Secondary | ICD-10-CM

## 2017-10-29 DIAGNOSIS — Z809 Family history of malignant neoplasm, unspecified: Secondary | ICD-10-CM

## 2017-10-29 DIAGNOSIS — D701 Agranulocytosis secondary to cancer chemotherapy: Secondary | ICD-10-CM

## 2017-10-29 DIAGNOSIS — L988 Other specified disorders of the skin and subcutaneous tissue: Secondary | ICD-10-CM | POA: Diagnosis not present

## 2017-10-29 LAB — CBC WITH DIFFERENTIAL/PLATELET
Basophils Absolute: 0 10*3/uL (ref 0–0.1)
Basophils Relative: 1 %
Eosinophils Absolute: 0 10*3/uL (ref 0–0.7)
Eosinophils Relative: 1 %
HCT: 23.4 % — ABNORMAL LOW (ref 40.0–52.0)
Hemoglobin: 8 g/dL — ABNORMAL LOW (ref 13.0–18.0)
Lymphocytes Relative: 31 %
Lymphs Abs: 0.8 10*3/uL — ABNORMAL LOW (ref 1.0–3.6)
MCH: 31.6 pg (ref 26.0–34.0)
MCHC: 34.1 g/dL (ref 32.0–36.0)
MCV: 92.8 fL (ref 80.0–100.0)
Monocytes Absolute: 0 10*3/uL — ABNORMAL LOW (ref 0.2–1.0)
Monocytes Relative: 1 %
Neutro Abs: 1.6 10*3/uL (ref 1.4–6.5)
Neutrophils Relative %: 66 %
Platelets: 148 10*3/uL — ABNORMAL LOW (ref 150–440)
RBC: 2.53 MIL/uL — ABNORMAL LOW (ref 4.40–5.90)
RDW: 15.2 % — ABNORMAL HIGH (ref 11.5–14.5)
WBC: 2.5 10*3/uL — ABNORMAL LOW (ref 3.8–10.6)

## 2017-10-29 LAB — BASIC METABOLIC PANEL
Anion gap: 7 (ref 5–15)
BUN: 27 mg/dL — ABNORMAL HIGH (ref 6–20)
CO2: 24 mmol/L (ref 22–32)
Calcium: 8.7 mg/dL — ABNORMAL LOW (ref 8.9–10.3)
Chloride: 108 mmol/L (ref 98–111)
Creatinine, Ser: 0.92 mg/dL (ref 0.61–1.24)
GFR calc Af Amer: >60 mL/min (ref >60)
GFR calc non Af Amer: >60 mL/min (ref >60)
Glucose, Bld: 113 mg/dL — ABNORMAL HIGH (ref 70–99)
Potassium: 3.3 mmol/L — ABNORMAL LOW (ref 3.5–5.1)
Sodium: 139 mmol/L (ref 135–145)

## 2017-10-29 LAB — PREPARE RBC (CROSSMATCH)

## 2017-10-29 LAB — ABO/RH: ABO/RH(D): A POS

## 2017-10-29 LAB — MAGNESIUM: Magnesium: 1.9 mg/dL (ref 1.7–2.4)

## 2017-10-29 MED ORDER — SILVER SULFADIAZINE 1 % EX CREA: TOPICAL_CREAM | Freq: Two times a day (BID) | CUTANEOUS | 2 refills | Status: DC

## 2017-10-29 MED ORDER — LORATADINE 10 MG PO TABS: 10.0000 mg | ORAL_TABLET | Freq: Every day | ORAL | 1 refills | Status: DC

## 2017-10-29 MED ORDER — OXYCODONE HCL ER 10 MG PO T12A: 10.0000 mg | EXTENDED_RELEASE_TABLET | Freq: Two times a day (BID) | ORAL | 0 refills | Status: DC

## 2017-10-29 MED ORDER — OXYCODONE HCL 5 MG PO TABS: 5.0000 mg | ORAL_TABLET | Freq: Three times a day (TID) | ORAL | 0 refills | Status: DC | PRN

## 2017-10-29 NOTE — Progress Notes (Signed)
[facility] day:  10/29/2017  Chief Complaint: Edgar [name] is a 27 y.o. male with recurrent testicular cancer who is seen for assessment following cycle #2 TIP chemotherapy.  HPI: The patient was last seen in the medical oncology clinic on 10/21/2017.  At that time, patient noted that he was "getting better every day".  He denied nausea, vomiting, and diarrhea.  No recurrent fevers.  Receiving home health nursing services for wound care.  Necrotic areas to LEFT thigh were improving with topical SSD cream.  Ambulation had improved following percutaneous angioplasty with LEFT common iliac vein stent placement on 10/13/2017.  Pain was controlled.  Normal neuropathy in hands and toes noted.  He was experiencing mild tinnitus.  He was admitted to inpatient oncology floor for cycle #2 TIP chemotherapy.  Patient was admitted to [facility] oncology floor from 10/21/2017 - 10/26/2017.  Notes reviewed.  Patient had an uncomplicated hospitalization for inpatient chemotherapy treatment.  Patient received daily wound care during his admission.  Pain was well managed.  Was seen in consult by Dr. Tsosie Billing (infectious disease) who felt the patient had been treated adequately with antibiotic coverage and not require follow-up further treatment for his left lower extremity cellulitis.  CBC on the day of discharge revealed a WBC 4700 ([place] 4600).  Hemoglobin 10.0, hematocrit 28.0, MCV 91.4, and platelets 371,000.  Electrolytes, renal function, and LFTs were normal.  Patient was discharged home on 10/26/2017 plans to follow-up with oncology in the outpatient setting.  Patient returned to the clinic on 10/27/2017 for you Udencya injection.  Patient was seen by providers at [facility] on 10/27/2017 for a routine audiogram. Audiogram, following second cycle of treatment with cisplatin therapy was within normal limits.  In the interim, patient notes that he has been doing  well since discharge from the hospital.  He notes that he has been more fatigued however for the most part he feels "good".  He has experienced mild shortness of breath with exertion.  Patient presents to the clinic today tachycardic to 108.  He denies any chest pain or palpitations.  He complains of significant bone pain following his Udencya injection.  He utilize the recommended loratadine x3 days, however notes not appreciate much improvement in his symptoms.  Patient continues daily wound care at home with SSD cream, Xeroform, and compression wraps.    Patient advises that he maintains an adequate appetite, however he is experiencing dysgeusia. He is eating well. Weight today is 258 lb 6 oz (117.2 kg), which compared to his last visit to the clinic, represents a 7 pound increase.   Patient is a moderate pain level today of 5 out of 10 despite using the prescribed analgesic interventions.   Past Medical History:  Diagnosis Date  . Asthma    AS A CHILD-NO INHALERS  . Cancer ([place])    testicular cancer 11/2016 per pt   . DVT (deep venous thrombosis) ([place])   . GERD (gastroesophageal reflux disease)    TUMS PRN    Past Surgical History:  Procedure Laterality Date  . ANKLE FRACTURE SURGERY Right 2004  . ORCHIECTOMY Left 11/26/2015   Procedure: ORCHIECTOMY;  Surgeon: [name], MD;  Location: [facility] ORS;  Service: Urology;  Laterality: Left;  radical/Inguinal approach  . PERIPHERAL VASCULAR CATHETERIZATION N/A 12/16/2015   Procedure: [name] Cath Insertion;  Surgeon: [name], MD;  Location: [place] CV LAB;  Service: Cardiovascular;  Laterality: N/A;  . PERIPHERAL  VASCULAR THROMBECTOMY Left 10/13/2017   Procedure: PERIPHERAL VASCULAR THROMBECTOMY;  Surgeon: [name], MD;  Location: [place] CV LAB;  Service: Cardiovascular;  Laterality: Left;  . WISDOM TOOTH EXTRACTION  2017    Family History  Problem Relation Age of Onset  . Skin cancer Mother   . Breast cancer  Maternal Grandmother   . Colon cancer Maternal Grandfather   . Diabetes Maternal Grandfather   . Emphysema Father   . Mental illness Sister        anxiety  . Stroke Paternal Grandmother   . Prostate cancer Neg Hx   . Kidney cancer Neg Hx   . Bladder Cancer Neg Hx     Social History:  reports that he has been smoking cigarettes. He has a 4.00 pack-year smoking history. He has quit using smokeless tobacco.  His smokeless tobacco use included snuff. He reports that he has current or past drug history. Drug: Marijuana. He reports that he does not drink alcohol.  He smokes 1/2 pack a day.  He smokes marijuana.  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  He has a 14 year-old-daughter named Edgar [name]. The patient's mother's cell phone number is [phone].  The patient lives in [place]. He is accompanied by his mother today.   Allergies: No Known Allergies  Current Medications: Current Outpatient Medications  Medication Sig Dispense Refill  . acetaminophen (TYLENOL) 500 MG tablet Take 500-1,000 mg by mouth every 6 (six) hours as needed for mild pain or fever.     Marland Kitchen dexamethasone (DECADRON) 4 MG tablet Take 4 mg by mouth daily.    . DULoxetine (CYMBALTA) 60 MG capsule Take 1 tablet by mouth daily.    Marland Kitchen gabapentin (NEURONTIN) 300 MG capsule Take 1 capsule (300 mg total) by mouth at bedtime. 30 capsule 0  . magnesium oxide (MAG-OX) 400 MG tablet Take 1 tablet by mouth daily.    . nicotine (NICODERM CQ - DOSED IN MG/24 HR) 7 mg/24hr patch Place 7 mg onto the skin daily.     . ondansetron (ZOFRAN-ODT) 4 MG disintegrating tablet Take 2 tablets by mouth every 8 (eight) hours as needed.    . polyethylene glycol (MIRALAX / GLYCOLAX) packet Take 17 g by mouth daily as needed for mild constipation. 14 each 0  . potassium chloride (KLOR-CON) 20 MEQ packet Take 20 mEq by mouth 2 (two) times daily.     . prochlorperazine (COMPAZINE) 10 MG tablet Take 1 tablet by mouth every 6 (six) hours as  needed (nausea/vomiting).     . silver sulfADIAZINE (SILVADENE) 1 % cream Apply topically 2 (two) times daily. Apply to blisters on left thigh and cover with nonadherent dressing 1000 g 2  . XARELTO 20 MG TABS tablet Take 1 tablet by mouth daily.    Marland Kitchen loratadine (CLARITIN) 10 MG tablet Take 1 tablet (10 mg total) by mouth daily. 90 tablet 1  . oxyCODONE (OXY IR/ROXICODONE) 5 MG immediate release tablet Take 1 tablet (5 mg total) by mouth every 8 (eight) hours as needed for severe pain or breakthrough pain. 30 tablet 0  . oxyCODONE (OXYCONTIN) 10 mg 12 hr tablet Take 1 tablet (10 mg total) by mouth every 12 (twelve) hours. 30 tablet 0   No current facility-administered medications for this visit.    Facility-Administered Medications Ordered in Other Visits  Medication Dose Route Frequency Provider Last Rate Last Dose  . heparin lock flush 100 unit/mL  500 Units Intravenous  Once [name], MD      . sodium chloride flush (NS) 0.9 % injection 10 mL  10 mL Intravenous PRN [name], MD   10 mL at 10/21/17 1008    Review of Systems  Constitutional: Positive for malaise/fatigue. Negative for diaphoresis, fever and weight loss (weight up 7 pounds).       "Im doing pretty good".   HENT: Positive for tinnitus. Negative for nosebleeds and sore throat.        Dysgeusia  Eyes: Negative.   Respiratory: Positive for shortness of breath (exertional). Negative for cough, hemoptysis and sputum production.   Cardiovascular: Positive for leg swelling (LLE - improved). Negative for chest pain, palpitations, orthopnea and PND.  Gastrointestinal: Negative for abdominal pain, blood in stool, constipation, diarrhea, melena, nausea and vomiting.  Genitourinary: Negative for dysuria, frequency, hematuria and urgency.  Musculoskeletal: Negative for back pain, falls, joint pain and myalgias.       Bone pain following Udencya.  Skin: Negative for itching and rash.       Healing wounds to LEFT  thigh.  Neurological: Positive for sensory change (neuropathy to hands and toes - minimal - stable). Negative for dizziness, tremors, weakness and headaches.  Endo/Heme/Allergies: Does not bruise/bleed easily.  Psychiatric/Behavioral: Negative for depression, memory loss and suicidal ideas. The patient is not nervous/anxious and does not have insomnia.   All other systems reviewed and are negative.  Performance status (ECOG): 1 - Symptomatic but completely ambulatory  Vital Signs BP 130/85 (BP Location: Left Arm, Patient Position: Sitting)   Pulse (!) 108   Temp 98.1 F (36.7 C) (Tympanic)   Resp 18   Wt 258 lb 6 oz (117.2 kg)   BMI 32.29 kg/m   Physical Exam  Constitutional: He is oriented to person, place, and time and well-developed, well-nourished, and in no distress.  HENT:  Head: Normocephalic and atraumatic. Hair is abnormal (chemotherapy induced alopecia).  Eyes: Pupils are equal, round, and reactive to light. EOM are normal. No scleral icterus.  Brown eyes.   Neck: Normal range of motion. Neck supple. No tracheal deviation present. No thyromegaly present.  Cardiovascular: Regular rhythm and normal heart sounds. Tachycardia present. Exam reveals no gallop and no friction rub.  No murmur heard. Pulmonary/Chest: Effort normal and breath sounds normal. No respiratory distress. He has no wheezes. He has no rales.  Abdominal: Soft. Bowel sounds are normal. He exhibits no distension. There is no tenderness.  Musculoskeletal: Normal range of motion. He exhibits edema (LLE edematous, however improved overall. (+) swelling to LEFT thigh. ). He exhibits no tenderness.  Lymphadenopathy:    He has no cervical adenopathy.    He has no axillary adenopathy.       Right: No inguinal and no supraclavicular adenopathy present.       Left: No inguinal and no supraclavicular adenopathy present.  Neurological: He is alert and oriented to person, place, and time.  Skin: Skin is warm and dry.  Lesion (6 circular lesions to LEFT inner and posterior thigh have IMPROVED - previously dark purple, now red and resemble an open blister/burned areas of skin. ) noted. No rash noted. There is pallor.  Psychiatric: Mood, affect and judgment normal.  Nursing note and vitals reviewed.   Imaging studies: 11/28/2015:  Chest, abdomen, and pelvic CT revealed no mediastinal adenopathy or suspicious pulmonary nodules.   There were 2 prominent left periaortic retroperitoneal lymph nodes (1.7 cm and 0.9 cm) concerning for testicular nodal metastasis.  There were no abnormal lymph nodes above the renal veins.  There were postsurgical change in the left hemiscrotum and left inguinal canal.  There was a 3.3 cm soft tissue abnormality anterior to the left iliopsoas muscle which could represent a metastatic lymph node (N2 lesion).  12/17/2015:  Head MRI revealed no evidence of metastatic disease.   03/30/2016:  Chest, abdomen, and pelvic CT revealed interval resolution of left abdominal/pelvic lymphadenopathy.  There was no residual metastatic disease. 09/30/2016:  Chest, abdomen, and pelvic CT revealed no evidence of recurrent or metastatic disease.  09/10/2017:  Bilateral lower extremity duplex revealed evidence of obstruction in the left CFV, SFJ, and proximal femoral vein.  09/10/2017:  CT pelvic venogram revealed multiple retroperitoneal masses including large mass within the left iliacus concerning for metastatic disease in the setting of prior testicular cancer.  Mass in the left iliacus caused significant narrowing of the proximal left external iliac vein. There was filling defect within the distal left external iliac vein extending into the common femoral and proximal superficial femoral veins consistent with thrombus  09/11/2017:  Chest, abdomen, and pelvic CT revealed enlarged and morphologically abnormal 4.3 x 4.9 cm left pelvic sidewall and retroperitoneal lymph nodes (measuring up to 2.9 cm) concerning  for metastatic disease given history of testicular cancer. Biopsy was suggested for confirmation. Prominent left inguinal lymph nodes could be metastatic or reactive in nature.  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  There was anasarca/swelling of the left lower extremity.  There was no evidence of metastatic disease in the chest. 09/13/2017:  Head MRI revealed no evidence of metastatic disease.   Infusion on 10/29/2017  Component Date Value Ref Range Status  . ABO/RH(D) 10/29/2017    Final                   Value:A POS Performed at [facility], [address] [place], [place] [address]   . ABO/RH(D) 10/29/2017 A POS   Final  . Antibody Screen 10/29/2017 NEG   Final  . Sample Expiration 10/29/2017    Final                   Value:11/01/2017 Performed at [facility], [address] [place], [place] [address]   . Unit Number 10/29/2017 [id number]   Final  . Blood Component Type 10/29/2017 RBC, LR IRR   Final  . Unit division 10/29/2017 00   Final  . Status of Unit 10/29/2017 ALLOCATED   Final  . Transfusion Status 10/29/2017 OK TO TRANSFUSE   Final  . Crossmatch Result 10/29/2017 Compatible   Final  . Blood Product Unit Number 10/29/2017 [id number]   Final  . Unit Type and Rh 10/29/2017 6200   Final  . Blood Product Expiration Date 10/29/2017 [id number]   Final  Office Visit on 10/29/2017  Component Date Value Ref Range Status  . Order Confirmation 10/29/2017    Final                   Value:ORDER PROCESSED BY BLOOD BANK Performed at [facility], [address] [place], [place] [address]   Appointment on 10/29/2017  Component Date Value Ref Range Status  . Magnesium 10/29/2017 1.9  1.7 - 2.4 mg/dL Final   Performed at [facility], [address] [place], [place] [address]  . Sodium 10/29/2017 139  135 - 145 mmol/L Final  . Potassium 10/29/2017  3.3* 3.5 - 5.1  mmol/L Final  . Chloride 10/29/2017 108  98 - 111 mmol/L Final  . CO2 10/29/2017 24  22 - 32 mmol/L Final  . Glucose, Bld 10/29/2017 113* 70 - 99 mg/dL Final  . BUN 10/29/2017 27* 6 - 20 mg/dL Final  . Creatinine, Ser 10/29/2017 0.92  0.61 - 1.24 mg/dL Final  . Calcium 10/29/2017 8.7* 8.9 - 10.3 mg/dL Final  . GFR calc non Af Amer 10/29/2017 >60  >60 mL/min Final  . GFR calc Af Amer 10/29/2017 >60  >60 mL/min Final   Comment: (NOTE) The eGFR has been calculated using the CKD EPI equation. This calculation has not been validated in all clinical situations. eGFR's persistently <60 mL/min signify possible Chronic Kidney Disease.   Georgiann Hahn gap 10/29/2017 7  5 - 15 Final   Performed at [facility], [address] [place], [place] [address]  . WBC 10/29/2017 2.5* 3.8 - 10.6 K/uL Final  . RBC 10/29/2017 2.53* 4.40 - 5.90 MIL/uL Final  . Hemoglobin 10/29/2017 8.0* 13.0 - 18.0 g/dL Final  . HCT 10/29/2017 23.4* 40.0 - 52.0 % Final  . MCV 10/29/2017 92.8  80.0 - 100.0 fL Final  . MCH 10/29/2017 31.6  26.0 - 34.0 pg Final  . MCHC 10/29/2017 34.1  32.0 - 36.0 g/dL Final  . RDW 10/29/2017 15.2* 11.5 - 14.5 % Final  . Platelets 10/29/2017 148* 150 - 440 K/uL Final  . Neutrophils Relative % 10/29/2017 66  % Final  . Neutro Abs 10/29/2017 1.6  1.4 - 6.5 K/uL Final  . Lymphocytes Relative 10/29/2017 31  % Final  . Lymphs Abs 10/29/2017 0.8* 1.0 - 3.6 K/uL Final  . Monocytes Relative 10/29/2017 1  % Final  . Monocytes Absolute 10/29/2017 0.0* 0.2 - 1.0 K/uL Final  . Eosinophils Relative 10/29/2017 1  % Final  . Eosinophils Absolute 10/29/2017 0.0  0 - 0.7 K/uL Final  . Basophils Relative 10/29/2017 1  % Final  . Basophils Absolute 10/29/2017 0.0  0 - 0.1 K/uL Final   Performed at [facility], [address], [place], [place] [address]    Assessment:  KASPER MUDRICK is a 27 y.o. male with recurrent testicular cancer.  He presented with a left lower extremity DVT on  09/10/2017.  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  Clinical/pathologic stage is T1 N1-2 S0 M0.  He received 3 cycles of  BEP (12/30/2015 - 02/24/2016).  He received OnPro Neulasta with cycle #2 and #3.  He declined evaluation for retroperitoneal lymph node dissection (RPLND).  He declined sperm banking.  Pre surgery labs on 11/20/2015 revealed an AFP of 411.9, beta-HCG 2,669, and LDH 522 (121-224).    Tumor markers have been followed:   AFP was 411.9 (0-8.3) on 11/20/2015, 11.2 on 12/19/2015, 6.3 on 12/23/2015, 1.3 on 01/27/2016, 1.8 on 02/17/2016, 1.9 on 03/30/2016, 1.4 on 05/27/2016, 1.0 on 07/29/2016, 1.4 on 10/14/2016, 3.3 on 12/24/2016, 1 on 09/10/2017, 1 on 09/20/2017, and 2.6 on 10/21/2017.    Beta-HCG was 2,669 (0-3) on 11/20/2015, 2 on 12/19/2015, 1 on 12/23/2015, < 1 on 01/27/2016, < 1 on 02/17/2016, < 1 on 03/30/2016, < 1 on 05/27/2016,  < 1 on 07/29/2016, < 1 on 10/14/2016, and 3 on 12/24/2016, < 5 on 09/11/2017,  <  5 on 09/20/2017, and < 1 on 10/21/2017.  LDH was 522 (98-192) on 11/20/2015, 179 on 12/19/2015, 160 on 12/23/2015, 202 on 01/27/2016, 156 on 02/10/2016, 161 on 02/17/2016, 172 on 03/30/2016, 178 on 05/27/2016, 150 on 07/29/2016, 152 on 10/14/2016, 145 on 12/24/2016, 1557 on 09/11/2017, 916 on 09/20/2017, and 144 on 10/21/2017.  Chest, abdomen, and pelvic CT on 09/11/2017 revealed enlarged and morphologically abnormal 4.3 x 4.9 cm left pelvic sidewall and retroperitoneal lymph nodes (measuring up to 2.9 cm).  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  There was anasarca/swelling of the left lower extremity.  There was no evidence of metastatic disease in the chest.  Head MRI on  09/13/2017 revealed no evidence of metastatic disease.   He has a left lower extremity DVT.  Bilateral lower extremity duplex on 09/10/2017 revealed evidence of obstruction in the left CFV, SFJ, and proximal femoral vein.  He is on Xarelto.  He is currently day 8 s/p cycle #2 TIP chemotherapy (09/21/2017 - 10/21/2017) with Margarette Canada support. Cycle #1 was administered at [facility]. Cycle #2 was administered at [facility].  Audiogram, following second cycle of cisplatin therapy, was done on 10/27/2017 that revealed hearing within normal limits.  He was diagnosed with cellulitis of the left lower extremity on 10/06/2017.  He was on clindamycin.  While hospitalized (10/08/2017 - 10/16/2017), he received 9 days of vancomycin and Cefepime.  He was discharged on Flagyl, doxycycline, and Levaquin.  He was admitted to [facility] from 10/08/2017 - 10/16/2017.  He was treated with Cefepime and vancomycin.  Imaging revealed significant improvement in adenopathy with decreased pelvic sidewall disease and decreased bulk at aortic bifurcation.  He was felt to have venous congestion syndrome with skin lesions due to congestion.   He was admitted to [facility] oncology floor from 10/21/2017 - 10/26/2017 for inpatient chemotherapy treatment (cycle #2 TIP).  Was seen in consult by Dr. Tsosie Billing (infectious disease) who felt the patient had been treated adequately with antibiotic coverage and not require follow-up further treatment for his left lower extremity cellulitis.  CBC on the day of discharge revealed a WBC 4700 ([place] 4600).  Hemoglobin 10.0, hematocrit 28.0, MCV 91.4, and platelets 371,000.    He underwent IVC filterplacement, percutaneous angioplastyand stent placementof the left common iliac vein and left external iliac vein on 10/13/2017.  He was discharged on Flagyl, doxycycline, and Levaquin.  Hypercoagulable work-up on 10/12/2017 negative for the following: Factor V Leiden, prothrombin gene mutation, lupus  anticoagulant, anticardiolipin antibodies, beta-2 glycoprotein, protein C activity/antigen, protein S activity/antigen. ATIII was 54% (75-120) on heparin.  Symptomatically, patient is doing well overall. He notes some minor fatigue and exertional shortness of breath. He is pale in color. Patient eating well; weight up 7 pounds. He is experiencing dysgeusia. Patient complains of moderate pain today rated 5/10 despite use of prescribed analgesics. He is TACHYcardic to 108. No chest pain or palpitations. Patient experienced significant bone pain following Udencya injection despite use of recommended loratadine. Exam reveals decreased swelling to LEFT lower extremity. 6 lesions to thigh have markedly improved with daily wound care. WBC 2500 (ANC 1600).  Hemoglobin 8.0, hematocrit 23.4, and platelets 148,000.  Potassium low at 3.3.  Calcium low at 8.7.  Magnesium normal at 1.9.  BUN 27 and creatinine 0.92 (163.6 mL/min).  Plan: 1. Labs today: CBC with differential, CMP, magnesium 2. Recurrent testicular cancer - treatment ongoing  Patient tolerated cycle #2 TIP chemotherapy well overall.  Post  treatment symptoms include: mild fatigue, mild exertional shortness of breath, dysgeusia, and bone pain following Udencya.   Labs reviewed. Patient with stable degree of chemotherapy induced neutropenia. WBC 2500 (ANC 1600).  Received Udencya on 10/27/2017.  Discussed rescheduling next TIP cycle to began with inpatient admission on a Monday rather than Thursday per pharmacy and patient request.   Reviewed that there will have to be 21 days in between cycles.    His last cycle began on 10/21/2017.  Cycle #3 TIP chemotherapy currently scheduled for 11/11/2017.  Will defer treatment cycle until the following Monday (11/15/2017).   Message has been sent to inpatient unit, pharmacy, and financials to make everyone aware of the changes in his proposed admission plans. As it stands patient;s next cycles will begin on  11/15/2017 and 12/06/2017.  Discuss symptom management.  Patient has antiemetics at home to use on a PRN basis. Patient  advising that the  prescribed interventions are adequate at this point.  Discuss pain management. Pain has not been adequately controlled since being discharged from the hospital.  He was previously on LAO with SAO for breakthrough. Medications were changed while inpatient.  Given a current oncology diagnosis, this patient has the potential to experience significant cancer related pain. Pain 5/10 in clinic today. Benefits versus risks associated with continued therapy considered. Will continue pain management with opioids as previously prescribed. Patient educated that medications should not be bitten, chewed, or crushed. Additionally, safety precautions reviewed. Patient verbalized understanding that medications should not be sold or shared, taken with alcohol, or used while driving. He has been made aware of the side effects of using this medication. Patient understands that this medication can cause CNS depression, increase his risk of falls, and even lead to overdose that results in death, if used outside of the parameters that he and I discussed. With all of this in mind, he accepts the risks and responsibilities associated with therapy and elects to continue to use the prescribed interventions.  Refill prescriptions sent in for: OxyContin 10 mg BID (Disp #30) and Roxicodone 5 mg TID PRN (Disp #30) 3. Chemotherapy induced anemia - acute  Hemoglobin 8.0, hematocrit 23.4, MCV 92.8, and platelets 148,000.  Patient is fatigued and experiencing slight exertional dyspnea. He is tachycardic to 108.  Discussed need for PRBC transfusion, however patient notes that he "feels good" and would like to defer transfusion until Monday.  Type and screen, ABO Rh, and 1 unit of PRBCs ordered for transfusion on 11/01/2017. 4. Cellulitis with necrotic leg lesions secondary to DVT -  improving  Cellulitic changes have resolved. LEFT lower extremity edema has markedly improved. Swelling persists in thigh area.   Lesions x 5 to LEFT inner thigh and x 1 to posterior thigh improving with daily wound care (SSD + Xeroform + compression bandage). Areas initially dark purple and thought to be necrotic. With wound care efforts, lesions are erythematous with wound beds that resemble an open blister/burn.   Continue daily wound care. New Rx for SSD cream sent to patient's pharmacy per his request.   Continue chronic anticoagulation with rivaroxaban 20 mg daily as previously prescribed.  5. Udencya induced bone pain - acute  Patient experienced significant bone pain following Udencya injection. Of note, he used the recommended loratadine x 3 days with no improvement.   Encouraged patient to extend loratadine course. Rx sent to pharmacy for 10 mg tablets daily (Disp #90).  6. Neuropathy - stable  Symptom stable at this time. Continue gabapentin  300 mg qhs as previously prescribed. Monitor for progression.  7. Nutrition - stable  Eating well. No weight loss (weight up 7 pounds). Staying adequately hydrated (CrCl 163.6 mL/min).   Labs reviewed. Magnesium normal. Potassium low at 3.3.   Patient would rather increase dietary intake of K+ rich foods than take oral supplement at this time. Will plan on rechecking at next visit.   Offered IVFs today, however patient declined. Patient to RTC on 11/01/2017 for labs, +/- IVFs, and PRBCs.  8. RTC on 11/01/2017 for labs (CBC with diff, BMP), +/- IVFs, and 1 unit PRBCs. 9. RTC on 11/05/2017 for MD assessment, labs (CBC with diff, CMP, Mg), and +/- IVFs  Honor Loh, NP  10/29/2017, 11:53 AM

## 2017-10-29 NOTE — Progress Notes (Signed)
Patient states he is feeling pretty good today.  Eating okay.  Has been drinking a lot to keep himself hydrated.  States he will need a refill on pain medication.  Will be out on Monday. Also asking for refill on Silvadene Cream.

## 2017-10-31 ENCOUNTER — Other Ambulatory Visit: Payer: Self-pay | Admitting: Hematology and Oncology

## 2017-11-01 ENCOUNTER — Telehealth: Payer: Self-pay | Admitting: *Deleted

## 2017-11-01 ENCOUNTER — Encounter: Payer: Self-pay | Admitting: Oncology

## 2017-11-01 ENCOUNTER — Inpatient Hospital Stay: Payer: BLUE CROSS/BLUE SHIELD

## 2017-11-01 DIAGNOSIS — Z7901 Long term (current) use of anticoagulants: Secondary | ICD-10-CM | POA: Diagnosis not present

## 2017-11-01 DIAGNOSIS — B009 Herpesviral infection, unspecified: Secondary | ICD-10-CM | POA: Diagnosis not present

## 2017-11-01 DIAGNOSIS — D6481 Anemia due to antineoplastic chemotherapy: Secondary | ICD-10-CM

## 2017-11-01 DIAGNOSIS — K219 Gastro-esophageal reflux disease without esophagitis: Secondary | ICD-10-CM | POA: Diagnosis not present

## 2017-11-01 DIAGNOSIS — J45909 Unspecified asthma, uncomplicated: Secondary | ICD-10-CM | POA: Diagnosis not present

## 2017-11-01 DIAGNOSIS — R000 Tachycardia, unspecified: Secondary | ICD-10-CM | POA: Diagnosis not present

## 2017-11-01 DIAGNOSIS — T451X5A Adverse effect of antineoplastic and immunosuppressive drugs, initial encounter: Principal | ICD-10-CM

## 2017-11-01 DIAGNOSIS — R432 Parageusia: Secondary | ICD-10-CM | POA: Diagnosis not present

## 2017-11-01 DIAGNOSIS — S91331A Puncture wound without foreign body, right foot, initial encounter: Secondary | ICD-10-CM | POA: Diagnosis not present

## 2017-11-01 DIAGNOSIS — L03116 Cellulitis of left lower limb: Secondary | ICD-10-CM | POA: Diagnosis not present

## 2017-11-01 DIAGNOSIS — M898X9 Other specified disorders of bone, unspecified site: Secondary | ICD-10-CM | POA: Diagnosis not present

## 2017-11-01 DIAGNOSIS — C6290 Malignant neoplasm of unspecified testis, unspecified whether descended or undescended: Secondary | ICD-10-CM

## 2017-11-01 DIAGNOSIS — C6212 Malignant neoplasm of descended left testis: Secondary | ICD-10-CM | POA: Diagnosis not present

## 2017-11-01 DIAGNOSIS — Z5111 Encounter for antineoplastic chemotherapy: Secondary | ICD-10-CM | POA: Diagnosis not present

## 2017-11-01 DIAGNOSIS — F17210 Nicotine dependence, cigarettes, uncomplicated: Secondary | ICD-10-CM | POA: Diagnosis not present

## 2017-11-01 DIAGNOSIS — Z79899 Other long term (current) drug therapy: Secondary | ICD-10-CM | POA: Diagnosis not present

## 2017-11-01 DIAGNOSIS — G629 Polyneuropathy, unspecified: Secondary | ICD-10-CM | POA: Diagnosis not present

## 2017-11-01 LAB — CBC WITH DIFFERENTIAL/PLATELET
BASOS PCT: 3 %
Basophils Absolute: 0 10*3/uL (ref 0–0.1)
Eosinophils Absolute: 0 10*3/uL (ref 0–0.7)
Eosinophils Relative: 1 %
HCT: 21.5 % — ABNORMAL LOW (ref 40.0–52.0)
HEMOGLOBIN: 7.2 g/dL — AB (ref 13.0–18.0)
Lymphocytes Relative: 58 %
Lymphs Abs: 0.4 10*3/uL — ABNORMAL LOW (ref 1.0–3.6)
MCH: 30.6 pg (ref 26.0–34.0)
MCHC: 33.3 g/dL (ref 32.0–36.0)
MCV: 91.9 fL (ref 80.0–100.0)
MONOS PCT: 35 %
Monocytes Absolute: 0.3 10*3/uL (ref 0.2–1.0)
NEUTROS PCT: 3 %
Neutro Abs: 0 10*3/uL — ABNORMAL LOW (ref 1.4–6.5)
PLATELETS: 50 10*3/uL — AB (ref 150–440)
RBC: 2.34 MIL/uL — AB (ref 4.40–5.90)
RDW: 14.8 % — ABNORMAL HIGH (ref 11.5–14.5)
WBC: 0.7 10*3/uL — AB (ref 3.8–10.6)

## 2017-11-01 LAB — BASIC METABOLIC PANEL
Anion gap: 7 (ref 5–15)
BUN: 12 mg/dL (ref 6–20)
CALCIUM: 9.1 mg/dL (ref 8.9–10.3)
CO2: 28 mmol/L (ref 22–32)
CREATININE: 0.8 mg/dL (ref 0.61–1.24)
Chloride: 105 mmol/L (ref 98–111)
GFR calc non Af Amer: >60 mL/min (ref >60)
Glucose, Bld: 106 mg/dL — ABNORMAL HIGH (ref 70–99)
Potassium: 3.6 mmol/L (ref 3.5–5.1)
SODIUM: 140 mmol/L (ref 135–145)

## 2017-11-01 MED ORDER — HEPARIN SOD (PORK) LOCK FLUSH 100 UNIT/ML IV SOLN
500.0000 [IU] | Freq: Once | INTRAVENOUS | Status: AC
  Administered 2017-11-01: 500 [IU] via INTRAVENOUS

## 2017-11-01 MED ORDER — DIPHENHYDRAMINE HCL 25 MG PO CAPS
25.0000 mg | ORAL_CAPSULE | Freq: Once | ORAL | Status: AC
  Administered 2017-11-01: 25 mg via ORAL
  Filled 2017-11-01: qty 1

## 2017-11-01 MED ORDER — SODIUM CHLORIDE 0.9 % IV SOLN
INTRAVENOUS | Status: DC
  Administered 2017-11-01: 11:00:00 via INTRAVENOUS
  Filled 2017-11-01: qty 1000

## 2017-11-01 MED ORDER — SODIUM CHLORIDE 0.9% FLUSH
10.0000 mL | INTRAVENOUS | Status: DC | PRN
  Administered 2017-11-01: 10 mL via INTRAVENOUS
  Filled 2017-11-01: qty 10

## 2017-11-01 MED ORDER — ACETAMINOPHEN 325 MG PO TABS
650.0000 mg | ORAL_TABLET | Freq: Once | ORAL | Status: AC
  Administered 2017-11-01: 650 mg via ORAL
  Filled 2017-11-01: qty 2

## 2017-11-01 NOTE — Telephone Encounter (Signed)
Addon 1 unit of Blood on 11/05/17 Per [name] "Verbal on 11/01/17  I called patient to make him aware of the time change in regards to his Scheduled 11/05/17 [facility] Lab/MD/ +/- IVF/ Addon 1 unit of Blood. Patient is aware.

## 2017-11-01 NOTE — Progress Notes (Signed)
Asked to assess patient's legs while in the infusion.  Patient receiving 1 unit packed red blood cells for hemoglobin of 7.2.   Was recently discharged from hospital for 5 days of cisplatin/Ifex/Mesnex and monitoring.  Has history of significant left DVT upper thigh cellulitis.   Today, he denies pain in either extremity. Admits to swelling in BLE during the day while he is moving around with complete resolution when he wakes up. Admits to "rolling"  right ankle. Currently wrapped in ace bandage.  Ecchymosis on right outer ankle and left inner ankle.    Non pitting dependent edema in BLE. No signs of infection/cellulitis. Encouraged him to keep elevated when sitting down and to purchase TED hose to help prevent pooling. Skin is clean, dry and intact.  Left upper thigh cellulitis is "throbbing" but this is not a new complaint. He continues to change dressings BID as instructed by wound care. No signs of infection per patient and mother. No visible open wounds.   He has follow-up with Dr. Mike Gip on Friday.   Faythe Casa, NP 11/01/17

## 2017-11-02 DIAGNOSIS — F17210 Nicotine dependence, cigarettes, uncomplicated: Secondary | ICD-10-CM | POA: Diagnosis not present

## 2017-11-02 DIAGNOSIS — Z7901 Long term (current) use of anticoagulants: Secondary | ICD-10-CM | POA: Diagnosis not present

## 2017-11-02 DIAGNOSIS — Z452 Encounter for adjustment and management of vascular access device: Secondary | ICD-10-CM | POA: Diagnosis not present

## 2017-11-02 DIAGNOSIS — Z792 Long term (current) use of antibiotics: Secondary | ICD-10-CM | POA: Diagnosis not present

## 2017-11-02 DIAGNOSIS — J45909 Unspecified asthma, uncomplicated: Secondary | ICD-10-CM | POA: Diagnosis not present

## 2017-11-02 DIAGNOSIS — K219 Gastro-esophageal reflux disease without esophagitis: Secondary | ICD-10-CM | POA: Diagnosis not present

## 2017-11-02 DIAGNOSIS — Z9181 History of falling: Secondary | ICD-10-CM | POA: Diagnosis not present

## 2017-11-02 DIAGNOSIS — Z79891 Long term (current) use of opiate analgesic: Secondary | ICD-10-CM | POA: Diagnosis not present

## 2017-11-02 DIAGNOSIS — F329 Major depressive disorder, single episode, unspecified: Secondary | ICD-10-CM | POA: Diagnosis not present

## 2017-11-02 DIAGNOSIS — C6290 Malignant neoplasm of unspecified testis, unspecified whether descended or undescended: Secondary | ICD-10-CM | POA: Diagnosis not present

## 2017-11-02 DIAGNOSIS — I82412 Acute embolism and thrombosis of left femoral vein: Secondary | ICD-10-CM | POA: Diagnosis not present

## 2017-11-02 DIAGNOSIS — L982 Febrile neutrophilic dermatosis [Sweet]: Secondary | ICD-10-CM | POA: Diagnosis not present

## 2017-11-02 DIAGNOSIS — L03116 Cellulitis of left lower limb: Secondary | ICD-10-CM | POA: Diagnosis not present

## 2017-11-02 DIAGNOSIS — D6481 Anemia due to antineoplastic chemotherapy: Secondary | ICD-10-CM | POA: Diagnosis not present

## 2017-11-02 DIAGNOSIS — D649 Anemia, unspecified: Secondary | ICD-10-CM | POA: Diagnosis not present

## 2017-11-02 DIAGNOSIS — L988 Other specified disorders of the skin and subcutaneous tissue: Secondary | ICD-10-CM | POA: Diagnosis not present

## 2017-11-02 DIAGNOSIS — F321 Major depressive disorder, single episode, moderate: Secondary | ICD-10-CM | POA: Diagnosis not present

## 2017-11-02 DIAGNOSIS — I824Y2 Acute embolism and thrombosis of unspecified deep veins of left proximal lower extremity: Secondary | ICD-10-CM | POA: Diagnosis not present

## 2017-11-02 LAB — TYPE AND SCREEN
ABO/RH(D): A POS
Antibody Screen: NEGATIVE
UNIT DIVISION: 0

## 2017-11-02 LAB — BPAM RBC
BLOOD PRODUCT EXPIRATION DATE: 201909012359
ISSUE DATE / TIME: 201908191147
UNIT TYPE AND RH: 6200

## 2017-11-03 ENCOUNTER — Telehealth: Payer: Self-pay | Admitting: *Deleted

## 2017-11-03 DIAGNOSIS — I82412 Acute embolism and thrombosis of left femoral vein: Secondary | ICD-10-CM | POA: Diagnosis not present

## 2017-11-03 DIAGNOSIS — Z9181 History of falling: Secondary | ICD-10-CM | POA: Diagnosis not present

## 2017-11-03 DIAGNOSIS — D6481 Anemia due to antineoplastic chemotherapy: Secondary | ICD-10-CM | POA: Diagnosis not present

## 2017-11-03 DIAGNOSIS — J45909 Unspecified asthma, uncomplicated: Secondary | ICD-10-CM | POA: Diagnosis not present

## 2017-11-03 DIAGNOSIS — D649 Anemia, unspecified: Secondary | ICD-10-CM | POA: Diagnosis not present

## 2017-11-03 DIAGNOSIS — Z452 Encounter for adjustment and management of vascular access device: Secondary | ICD-10-CM | POA: Diagnosis not present

## 2017-11-03 DIAGNOSIS — L988 Other specified disorders of the skin and subcutaneous tissue: Secondary | ICD-10-CM | POA: Diagnosis not present

## 2017-11-03 DIAGNOSIS — F321 Major depressive disorder, single episode, moderate: Secondary | ICD-10-CM | POA: Diagnosis not present

## 2017-11-03 DIAGNOSIS — L982 Febrile neutrophilic dermatosis [Sweet]: Secondary | ICD-10-CM | POA: Diagnosis not present

## 2017-11-03 DIAGNOSIS — C6290 Malignant neoplasm of unspecified testis, unspecified whether descended or undescended: Secondary | ICD-10-CM | POA: Diagnosis not present

## 2017-11-03 DIAGNOSIS — Z792 Long term (current) use of antibiotics: Secondary | ICD-10-CM | POA: Diagnosis not present

## 2017-11-03 DIAGNOSIS — I824Y2 Acute embolism and thrombosis of unspecified deep veins of left proximal lower extremity: Secondary | ICD-10-CM | POA: Diagnosis not present

## 2017-11-03 DIAGNOSIS — Z7901 Long term (current) use of anticoagulants: Secondary | ICD-10-CM | POA: Diagnosis not present

## 2017-11-03 DIAGNOSIS — F17210 Nicotine dependence, cigarettes, uncomplicated: Secondary | ICD-10-CM | POA: Diagnosis not present

## 2017-11-03 DIAGNOSIS — F329 Major depressive disorder, single episode, unspecified: Secondary | ICD-10-CM | POA: Diagnosis not present

## 2017-11-03 DIAGNOSIS — Z79891 Long term (current) use of opiate analgesic: Secondary | ICD-10-CM | POA: Diagnosis not present

## 2017-11-03 DIAGNOSIS — K219 Gastro-esophageal reflux disease without esophagitis: Secondary | ICD-10-CM | POA: Diagnosis not present

## 2017-11-03 DIAGNOSIS — L03116 Cellulitis of left lower limb: Secondary | ICD-10-CM | POA: Diagnosis not present

## 2017-11-03 NOTE — Telephone Encounter (Signed)
Unable to reach patient on either of his numbers

## 2017-11-03 NOTE — Telephone Encounter (Signed)
Patient agreed to 3 visits for home exercise program, so order given to [facility]

## 2017-11-03 NOTE — Telephone Encounter (Signed)
Requesting physical therapy ordewrs for patient 1 week one, 2 week 2 Please return call if agree to paln [phone]

## 2017-11-03 NOTE — Telephone Encounter (Signed)
If he feels like he needs it, that is fine. I am not so sure that he will participate. He is young and very independent. I will sign orders if they feel like it would be of benefit to him.

## 2017-11-04 ENCOUNTER — Other Ambulatory Visit: Payer: BLUE CROSS/BLUE SHIELD

## 2017-11-04 ENCOUNTER — Emergency Department: Payer: BLUE CROSS/BLUE SHIELD

## 2017-11-04 ENCOUNTER — Emergency Department
Admission: EM | Admit: 2017-11-04 | Discharge: 2017-11-05 | Disposition: A | Discharge status: Home / Self Care | Payer: BLUE CROSS/BLUE SHIELD | Source: Home / Self Care | Attending: Emergency Medicine | Admitting: Emergency Medicine

## 2017-11-04 ENCOUNTER — Encounter: Payer: Self-pay | Admitting: Emergency Medicine

## 2017-11-04 ENCOUNTER — Inpatient Hospital Stay: Payer: BLUE CROSS/BLUE SHIELD | Admitting: Hematology and Oncology

## 2017-11-04 ENCOUNTER — Other Ambulatory Visit: Payer: Self-pay

## 2017-11-04 DIAGNOSIS — Z7901 Long term (current) use of anticoagulants: Secondary | ICD-10-CM

## 2017-11-04 DIAGNOSIS — Z8547 Personal history of malignant neoplasm of testis: Secondary | ICD-10-CM | POA: Insufficient documentation

## 2017-11-04 DIAGNOSIS — R197 Diarrhea, unspecified: Secondary | ICD-10-CM | POA: Diagnosis not present

## 2017-11-04 DIAGNOSIS — X58XXXA Exposure to other specified factors, initial encounter: Secondary | ICD-10-CM | POA: Diagnosis not present

## 2017-11-04 DIAGNOSIS — A419 Sepsis, unspecified organism: Secondary | ICD-10-CM | POA: Diagnosis not present

## 2017-11-04 DIAGNOSIS — Z9079 Acquired absence of other genital organ(s): Secondary | ICD-10-CM | POA: Diagnosis not present

## 2017-11-04 DIAGNOSIS — Y939 Activity, unspecified: Secondary | ICD-10-CM | POA: Diagnosis not present

## 2017-11-04 DIAGNOSIS — I878 Other specified disorders of veins: Secondary | ICD-10-CM | POA: Diagnosis not present

## 2017-11-04 DIAGNOSIS — S91341A Puncture wound with foreign body, right foot, initial encounter: Secondary | ICD-10-CM | POA: Diagnosis not present

## 2017-11-04 DIAGNOSIS — E876 Hypokalemia: Secondary | ICD-10-CM | POA: Diagnosis not present

## 2017-11-04 DIAGNOSIS — Z95828 Presence of other vascular implants and grafts: Secondary | ICD-10-CM | POA: Diagnosis not present

## 2017-11-04 DIAGNOSIS — W228XXA Striking against or struck by other objects, initial encounter: Secondary | ICD-10-CM | POA: Diagnosis not present

## 2017-11-04 DIAGNOSIS — J45909 Unspecified asthma, uncomplicated: Secondary | ICD-10-CM | POA: Diagnosis not present

## 2017-11-04 DIAGNOSIS — L03116 Cellulitis of left lower limb: Secondary | ICD-10-CM | POA: Diagnosis not present

## 2017-11-04 DIAGNOSIS — Z79899 Other long term (current) drug therapy: Secondary | ICD-10-CM | POA: Diagnosis not present

## 2017-11-04 DIAGNOSIS — Z23 Encounter for immunization: Secondary | ICD-10-CM | POA: Insufficient documentation

## 2017-11-04 DIAGNOSIS — L270 Generalized skin eruption due to drugs and medicaments taken internally: Secondary | ICD-10-CM | POA: Diagnosis not present

## 2017-11-04 DIAGNOSIS — R51 Headache: Secondary | ICD-10-CM | POA: Diagnosis not present

## 2017-11-04 DIAGNOSIS — R432 Parageusia: Secondary | ICD-10-CM | POA: Diagnosis not present

## 2017-11-04 DIAGNOSIS — C6212 Malignant neoplasm of descended left testis: Secondary | ICD-10-CM | POA: Diagnosis not present

## 2017-11-04 DIAGNOSIS — Z809 Family history of malignant neoplasm, unspecified: Secondary | ICD-10-CM | POA: Diagnosis not present

## 2017-11-04 DIAGNOSIS — M7989 Other specified soft tissue disorders: Secondary | ICD-10-CM | POA: Diagnosis not present

## 2017-11-04 DIAGNOSIS — F321 Major depressive disorder, single episode, moderate: Secondary | ICD-10-CM | POA: Diagnosis not present

## 2017-11-04 DIAGNOSIS — F17210 Nicotine dependence, cigarettes, uncomplicated: Secondary | ICD-10-CM | POA: Diagnosis not present

## 2017-11-04 DIAGNOSIS — Z800 Family history of malignant neoplasm of digestive organs: Secondary | ICD-10-CM | POA: Diagnosis not present

## 2017-11-04 DIAGNOSIS — I82409 Acute embolism and thrombosis of unspecified deep veins of unspecified lower extremity: Secondary | ICD-10-CM | POA: Diagnosis not present

## 2017-11-04 DIAGNOSIS — R600 Localized edema: Secondary | ICD-10-CM | POA: Diagnosis not present

## 2017-11-04 DIAGNOSIS — Z792 Long term (current) use of antibiotics: Secondary | ICD-10-CM | POA: Diagnosis not present

## 2017-11-04 DIAGNOSIS — M898X9 Other specified disorders of bone, unspecified site: Secondary | ICD-10-CM | POA: Diagnosis not present

## 2017-11-04 DIAGNOSIS — C6290 Malignant neoplasm of unspecified testis, unspecified whether descended or undescended: Secondary | ICD-10-CM | POA: Diagnosis not present

## 2017-11-04 DIAGNOSIS — L989 Disorder of the skin and subcutaneous tissue, unspecified: Secondary | ICD-10-CM | POA: Diagnosis not present

## 2017-11-04 DIAGNOSIS — G629 Polyneuropathy, unspecified: Secondary | ICD-10-CM | POA: Diagnosis not present

## 2017-11-04 DIAGNOSIS — R509 Fever, unspecified: Secondary | ICD-10-CM

## 2017-11-04 DIAGNOSIS — K219 Gastro-esophageal reflux disease without esophagitis: Secondary | ICD-10-CM | POA: Diagnosis not present

## 2017-11-04 DIAGNOSIS — L988 Other specified disorders of the skin and subcutaneous tissue: Secondary | ICD-10-CM | POA: Diagnosis not present

## 2017-11-04 DIAGNOSIS — T451X5D Adverse effect of antineoplastic and immunosuppressive drugs, subsequent encounter: Secondary | ICD-10-CM | POA: Diagnosis not present

## 2017-11-04 DIAGNOSIS — R000 Tachycardia, unspecified: Secondary | ICD-10-CM | POA: Diagnosis not present

## 2017-11-04 DIAGNOSIS — Z86718 Personal history of other venous thrombosis and embolism: Secondary | ICD-10-CM | POA: Diagnosis not present

## 2017-11-04 DIAGNOSIS — D6481 Anemia due to antineoplastic chemotherapy: Secondary | ICD-10-CM | POA: Diagnosis not present

## 2017-11-04 DIAGNOSIS — Z79891 Long term (current) use of opiate analgesic: Secondary | ICD-10-CM | POA: Diagnosis not present

## 2017-11-04 DIAGNOSIS — Z452 Encounter for adjustment and management of vascular access device: Secondary | ICD-10-CM | POA: Diagnosis not present

## 2017-11-04 DIAGNOSIS — L982 Febrile neutrophilic dermatosis [Sweet]: Secondary | ICD-10-CM | POA: Diagnosis not present

## 2017-11-04 DIAGNOSIS — R609 Edema, unspecified: Secondary | ICD-10-CM | POA: Diagnosis not present

## 2017-11-04 DIAGNOSIS — D649 Anemia, unspecified: Secondary | ICD-10-CM

## 2017-11-04 DIAGNOSIS — Z803 Family history of malignant neoplasm of breast: Secondary | ICD-10-CM | POA: Diagnosis not present

## 2017-11-04 DIAGNOSIS — W450XXA Nail entering through skin, initial encounter: Secondary | ICD-10-CM | POA: Diagnosis not present

## 2017-11-04 DIAGNOSIS — D6959 Other secondary thrombocytopenia: Secondary | ICD-10-CM | POA: Diagnosis not present

## 2017-11-04 DIAGNOSIS — D509 Iron deficiency anemia, unspecified: Secondary | ICD-10-CM | POA: Diagnosis not present

## 2017-11-04 DIAGNOSIS — L519 Erythema multiforme, unspecified: Secondary | ICD-10-CM | POA: Diagnosis not present

## 2017-11-04 DIAGNOSIS — Z5111 Encounter for antineoplastic chemotherapy: Secondary | ICD-10-CM | POA: Diagnosis not present

## 2017-11-04 DIAGNOSIS — I824Y2 Acute embolism and thrombosis of unspecified deep veins of left proximal lower extremity: Secondary | ICD-10-CM | POA: Diagnosis not present

## 2017-11-04 DIAGNOSIS — T451X5A Adverse effect of antineoplastic and immunosuppressive drugs, initial encounter: Secondary | ICD-10-CM | POA: Diagnosis not present

## 2017-11-04 DIAGNOSIS — L080 Pyoderma: Secondary | ICD-10-CM | POA: Diagnosis not present

## 2017-11-04 DIAGNOSIS — S91331D Puncture wound without foreign body, right foot, subsequent encounter: Secondary | ICD-10-CM | POA: Diagnosis not present

## 2017-11-04 DIAGNOSIS — B009 Herpesviral infection, unspecified: Secondary | ICD-10-CM | POA: Diagnosis not present

## 2017-11-04 DIAGNOSIS — Z9181 History of falling: Secondary | ICD-10-CM | POA: Diagnosis not present

## 2017-11-04 DIAGNOSIS — W228XXD Striking against or struck by other objects, subsequent encounter: Secondary | ICD-10-CM | POA: Diagnosis not present

## 2017-11-04 DIAGNOSIS — D696 Thrombocytopenia, unspecified: Secondary | ICD-10-CM | POA: Diagnosis not present

## 2017-11-04 DIAGNOSIS — Z6835 Body mass index (BMI) 35.0-35.9, adult: Secondary | ICD-10-CM | POA: Diagnosis not present

## 2017-11-04 DIAGNOSIS — I82402 Acute embolism and thrombosis of unspecified deep veins of left lower extremity: Secondary | ICD-10-CM | POA: Diagnosis not present

## 2017-11-04 DIAGNOSIS — I82412 Acute embolism and thrombosis of left femoral vein: Secondary | ICD-10-CM | POA: Diagnosis not present

## 2017-11-04 DIAGNOSIS — I776 Arteritis, unspecified: Secondary | ICD-10-CM | POA: Diagnosis present

## 2017-11-04 DIAGNOSIS — F329 Major depressive disorder, single episode, unspecified: Secondary | ICD-10-CM | POA: Diagnosis not present

## 2017-11-04 DIAGNOSIS — Z9889 Other specified postprocedural states: Secondary | ICD-10-CM | POA: Diagnosis not present

## 2017-11-04 DIAGNOSIS — D61810 Antineoplastic chemotherapy induced pancytopenia: Secondary | ICD-10-CM | POA: Diagnosis not present

## 2017-11-04 DIAGNOSIS — B001 Herpesviral vesicular dermatitis: Secondary | ICD-10-CM | POA: Diagnosis not present

## 2017-11-04 DIAGNOSIS — Z95820 Peripheral vascular angioplasty status with implants and grafts: Secondary | ICD-10-CM | POA: Diagnosis not present

## 2017-11-04 DIAGNOSIS — S91331A Puncture wound without foreign body, right foot, initial encounter: Secondary | ICD-10-CM | POA: Diagnosis not present

## 2017-11-04 NOTE — ED Triage Notes (Addendum)
Patient ambulatory to triage with steady gait, without difficulty, mask in place. Skin pale; st fever today 100.7 (took aleve PTA), increased swelling to left leg; pt has portacath

## 2017-11-04 NOTE — ED Provider Notes (Signed)
[facility] Emergency Department Provider Note   ____________________________________________   First MD Initiated Contact with Patient 11/04/17 2312     (approximate)  I have reviewed the triage vital signs and the nursing notes.   HISTORY  Chief Complaint Fever    HPI Edgar [name] is a 27 y.o. male who presents to the ED from home with a chief complaint of fever.  Patient has a complex history of recurrent testicular cancer, status post chemotherapy with inpatient hospitalization from 8/10-2017 to 10/26/2017.  Recently on Flagyl, doxycycline and Levaquin for left leg cellulitis with now significantly healing necrotic regions.  He is status post IVC filter placement, percutaneous angioplasty and stent placement of the left common iliac vein and left external iliac vein on 10/13/2017.  Has chemotherapy-induced anemia status post transfusion on 11/01/2017.  On Xarelto for left lower extremity DVT diagnosed 09/10/2017.  Patient states he has been feeling fine since discharge from the hospital but tonight noted temperature of 100 F.  Took Aleve approximately 7 PM.  Also today noted warmth and swelling to his left lower leg.  Admits to not wearing his compression stockings today and was on his feet more than usual.  Also states he stepped on a nail 3 days ago with his right foot.  Denies cough, congestion, chest pain, shortness of breath, abdominal pain, nausea, vomiting, dysuria or diarrhea.  Has chronic pain from his cancer for which he takes OxyContin.   Past Medical History:  Diagnosis Date  . Asthma    AS A CHILD-NO INHALERS  . Cancer ([place])    testicular cancer 11/2016 per pt   . DVT (deep venous thrombosis) ([place])   . GERD (gastroesophageal reflux disease)    TUMS PRN    Patient Active Problem List   Diagnosis Date Noted  . Scrotal cancer ([place]) 10/21/2017  . Pain   . History of testicular cancer   . DVT (deep venous thrombosis) ([place])   . Acute deep  vein thrombosis (DVT) of proximal vein of left lower extremity ([place]) 10/09/2017  . Diarrhea 10/09/2017  . Goals of care, counseling/discussion 10/09/2017  . Normocytic anemia   . Cellulitis 10/08/2017  . Current moderate episode of major depressive disorder without prior episode ([place]) 11/20/2016  . Encounter for antineoplastic chemotherapy 01/19/2016  . Testicular cancer ([place]) 11/26/2015    Past Surgical History:  Procedure Laterality Date  . ANKLE FRACTURE SURGERY Right 2004  . ORCHIECTOMY Left 11/26/2015   Procedure: ORCHIECTOMY;  Surgeon: [name], MD;  Location: [facility] ORS;  Service: Urology;  Laterality: Left;  radical/Inguinal approach  . PERIPHERAL VASCULAR CATHETERIZATION N/A 12/16/2015   Procedure: [name] Cath Insertion;  Surgeon: [name], MD;  Location: [place] CV LAB;  Service: Cardiovascular;  Laterality: N/A;  . PERIPHERAL VASCULAR THROMBECTOMY Left 10/13/2017   Procedure: PERIPHERAL VASCULAR THROMBECTOMY;  Surgeon: [name], MD;  Location: [place] CV LAB;  Service: Cardiovascular;  Laterality: Left;  . WISDOM TOOTH EXTRACTION  2017    Prior to Admission medications   Medication Sig Start Date End Date Taking? Authorizing Provider  acetaminophen (TYLENOL) 500 MG tablet Take 500-1,000 mg by mouth every 6 (six) hours as needed for mild pain or fever.     [provider]  dexamethasone (DECADRON) 4 MG tablet Take 4 mg by mouth daily. 09/26/17 09/26/18  [provider]  DULoxetine (CYMBALTA) 60 MG capsule Take 1 tablet by mouth daily. 10/05/17 10/05/18  [provider]  gabapentin (  NEURONTIN) 300 MG capsule Take 1 capsule (300 mg total) by mouth at bedtime. 10/16/17   Gladstone Lighter, MD  loratadine (CLARITIN) 10 MG tablet Take 1 tablet (10 mg total) by mouth daily. 10/29/17   Karen Kitchens, NP  magnesium oxide (MAG-OX) 400 MG tablet Take 1 tablet by mouth daily. 10/04/17   [provider]  nicotine (NICODERM CQ - DOSED  IN MG/24 HR) 7 mg/24hr patch Place 7 mg onto the skin daily.  09/15/17   [provider]  ondansetron (ZOFRAN-ODT) 4 MG disintegrating tablet Take 2 tablets by mouth every 8 (eight) hours as needed. 09/26/17   [provider]  oxyCODONE (OXY IR/ROXICODONE) 5 MG immediate release tablet Take 1 tablet (5 mg total) by mouth every 8 (eight) hours as needed for severe pain or breakthrough pain. 10/29/17   Karen Kitchens, NP  oxyCODONE (OXYCONTIN) 10 mg 12 hr tablet Take 1 tablet (10 mg total) by mouth every 12 (twelve) hours. 10/29/17   Karen Kitchens, NP  polyethylene glycol (MIRALAX / GLYCOLAX) packet Take 17 g by mouth daily as needed for mild constipation. 10/16/17 11/15/17  Gladstone Lighter, MD  potassium chloride (KLOR-CON) 20 MEQ packet Take 20 mEq by mouth 2 (two) times daily.     [provider]  prochlorperazine (COMPAZINE) 10 MG tablet Take 1 tablet by mouth every 6 (six) hours as needed (nausea/vomiting).  09/26/17   [provider]  silver sulfADIAZINE (SILVADENE) 1 % cream Apply topically 2 (two) times daily. Apply to blisters on left thigh and cover with nonadherent dressing 10/29/17   Karen Kitchens, NP  XARELTO 20 MG TABS tablet Take 1 tablet by mouth daily. 09/26/17   [provider]    Allergies Patient has no known allergies.  Family History  Problem Relation Age of Onset  . Skin cancer Mother   . Breast cancer Maternal Grandmother   . Colon cancer Maternal Grandfather   . Diabetes Maternal Grandfather   . Emphysema Father   . Mental illness Sister        anxiety  . Stroke Paternal Grandmother   . Prostate cancer Neg Hx   . Kidney cancer Neg Hx   . Bladder Cancer Neg Hx     Social History Social History   Tobacco Use  . Smoking status: Current Every Day Smoker    Packs/day: 0.50    Years: 8.00    Pack years: 4.00    Types: Cigarettes  . Smokeless tobacco: Former Systems developer    Types: Snuff  Substance Use Topics  . Alcohol use: No  .  Drug use: Yes    Types: Marijuana    Review of Systems  Constitutional: Positive for fever. Eyes: No visual changes. ENT: No sore throat. Cardiovascular: Denies chest pain. Respiratory: Denies shortness of breath. Gastrointestinal: No abdominal pain.  No nausea, no vomiting.  No diarrhea.  No constipation. Genitourinary: Negative for dysuria. Musculoskeletal: Positive for left leg warmth and swelling.  Negative for back pain. Skin: Negative for rash. Neurological: Negative for headaches, focal weakness or numbness.   ____________________________________________   PHYSICAL EXAM:  VITAL SIGNS: ED Triage Vitals  Enc Vitals Group     BP 11/04/17 2256 119/74     Pulse Rate 11/04/17 2256 (!) 131     Resp 11/04/17 2256 20     Temp 11/04/17 2256 99.2 F (37.3 C)     Temp Source 11/04/17 2256 Oral     SpO2 11/04/17 2256 97 %  Weight 11/04/17 2257 260 lb (117.9 kg)     Height 11/04/17 2257 6\' 3"  (1.905 m)     Head Circumference --      Peak Flow --      Pain Score 11/04/17 2257 7     Pain Loc --      Pain Edu? --      Excl. in [place]? --     Constitutional: Alert and oriented.  Pale appearing and in mild acute distress. Eyes: Conjunctivae are normal. PERRL. EOMI. Head: Atraumatic. Nose: No congestion/rhinnorhea. Mouth/Throat: Mucous membranes are moist.  Oropharynx non-erythematous. Neck: No stridor.   Cardiovascular: Normal rate, regular rhythm. Grossly normal heart sounds.  Good peripheral circulation. Respiratory: Normal respiratory effort.  No retractions. Lungs CTAB. Gastrointestinal: Soft and nontender to light or deep palpation. No distention. No abdominal bruits. No CVA tenderness. Musculoskeletal:  LLE: Healing wounds to left thigh.  Left calf warm to the touch and erythematous.  Palpable femoral and distal pulses.  Brisk, less than 5-second capillary refill. Right foot: Tiny blood blister to sole of foot where patient stepped on the nail.  There is no swelling.   There is no surrounding warmth or erythema.   Neurologic:  Normal speech and language. No gross focal neurologic deficits are appreciated. No gait instability. Skin:  Skin is warm, dry and intact. No rash noted. Psychiatric: Mood and affect are normal. Speech and behavior are normal.  ____________________________________________   LABS (all labs ordered are listed, but only abnormal results are displayed)  Labs Reviewed  CBC WITH DIFFERENTIAL/PLATELET - Abnormal; Notable for the following components:      Result Value   RBC 2.40 (*)    Hemoglobin 7.6 (*)    HCT 22.2 (*)    RDW 15.0 (*)    Platelets 41 (*)    nRBC 1 (*)    All other components within normal limits  COMPREHENSIVE METABOLIC PANEL - Abnormal; Notable for the following components:   Potassium 3.3 (*)    Glucose, Bld 103 (*)    Calcium 8.7 (*)    All other components within normal limits  URINALYSIS, COMPLETE (UACMP) WITH MICROSCOPIC - Abnormal; Notable for the following components:   Color, Urine YELLOW (*)    APPearance CLEAR (*)    Hgb urine dipstick SMALL (*)    All other components within normal limits  CULTURE, BLOOD (ROUTINE X 2)  CULTURE, BLOOD (ROUTINE X 2)  URINE CULTURE  LACTIC ACID, PLASMA   ____________________________________________  EKG  None ____________________________________________  RADIOLOGY  ED MD interpretation: No acute cardiopulmonary process; no evidence of osteomyelitis  Official radiology report(s): Dg Chest 2 View  Result Date: 11/04/2017 CLINICAL DATA:  Fever, cancer patient EXAM: CHEST - 2 VIEW COMPARISON:  CT 09/30/2016, radiograph 10/06/2017 FINDINGS: Right-sided central venous port tip over the proximal right atrium. Low lung volumes. No focal opacity or pleural effusion. Stable cardiomediastinal silhouette. No pneumothorax. Partially visualized IVC filter. IMPRESSION: No active cardiopulmonary disease. Electronically Signed   By: Donavan Foil M.D.   On: 11/04/2017 23:43     Dg Foot Complete Right  Result Date: 11/05/2017 CLINICAL DATA:  Stepped on a nail 3 days ago. Evaluate for osteomyelitis. EXAM: RIGHT FOOT COMPLETE - 3+ VIEW COMPARISON:  None. FINDINGS: Faint radiopaque densities about the skin and soft tissues subjacent to the first-second interspace may be site of puncture wound. No soft tissue air. No fracture or dislocation. No osseous erosions or bony destructive change. No periosteal reaction. Diffuse soft  tissue edema. Screws in the distal tibia partially included. IMPRESSION: Scattered faint radiopaque densities about the skin and soft tissues subjacent to the first-interspace may be site of puncture wound. No radiographic findings of osteomyelitis. Electronically Signed   By: Jeb Levering M.D.   On: 11/05/2017 01:04    ____________________________________________   PROCEDURES  Procedure(s) performed: None  Procedures  Critical Care performed: No  ____________________________________________   INITIAL IMPRESSION / ASSESSMENT AND PLAN / ED COURSE  As part of my medical decision making, I reviewed the following data within the [place] History obtained from family, Nursing notes reviewed and incorporated, Labs reviewed, Old chart reviewed, Radiograph reviewed  and Notes from prior ED visits   27 year old male with complex medical history including recurrent testicular cancer status post chemotherapy 2 weeks ago, current LLE DVT on Xarelto, healing left thigh wounds, who presents with fever.  Differential diagnosis includes but is not limited to sepsis, cellulitis, UTI, pneumonia, etc.  Laboratory results remarkable for stable anemia, mild hypokalemia; lactate is negative as well as urinalysis.  Chest x-ray does not demonstrate pneumonia.  I personally reviewed patient's records and note that he was tachycardic on his oncology visit last week.  Will initiate IV fluid resuscitation, 1 mg IV Dilaudid for cancer pain.  Will  update patient's tetanus shot and image right foot to evaluate for osteomyelitis.  Clinical Course as of Nov 05 517  Fri Nov 05, 2017  0148 Updated patient and spouse of all test results.  Tachycardia has improved.  Patient has an appointment with his oncologist this morning.  Blood and urine cultures are pending.  We will give 1 dose IV clindamycin now for left lower leg cellulitis and defer to his oncologist for further antibiotic treatment.     [JS]  0236 IV antibiotic nearing completion.  Patient feeling significant better after 1 mg IV Dilaudid.  Will see his oncologist this morning.  Strict return precautions given.  Both verbalize understanding and agree with plan of care.   [JS]    Clinical Course User Index [JS] Paulette Blanch, MD     ____________________________________________   FINAL CLINICAL IMPRESSION(S) / ED DIAGNOSES  Final diagnoses:  Fever, unspecified fever cause  Cellulitis of left lower extremity  Anemia, unspecified type     ED Discharge Orders    None       Note:  This document was prepared using Dragon voice recognition software and may include unintentional dictation errors.    Paulette Blanch, MD 11/05/17 [phone]

## 2017-11-05 ENCOUNTER — Other Ambulatory Visit: Payer: Self-pay

## 2017-11-05 ENCOUNTER — Other Ambulatory Visit: Payer: Self-pay | Admitting: Urgent Care

## 2017-11-05 ENCOUNTER — Inpatient Hospital Stay: Payer: BLUE CROSS/BLUE SHIELD

## 2017-11-05 ENCOUNTER — Inpatient Hospital Stay
Admission: AD | Admit: 2017-11-05 | Discharge: 2017-11-09 | DRG: 871 | Disposition: A | Discharge status: Home Health | Payer: BLUE CROSS/BLUE SHIELD | Attending: Internal Medicine | Admitting: Internal Medicine

## 2017-11-05 ENCOUNTER — Ambulatory Visit
Admission: RE | Admit: 2017-11-05 | Discharge: 2017-11-05 | Disposition: A | Discharge status: Home / Self Care | Payer: BLUE CROSS/BLUE SHIELD | Source: Ambulatory Visit | Attending: Urgent Care | Admitting: Urgent Care

## 2017-11-05 ENCOUNTER — Emergency Department: Payer: BLUE CROSS/BLUE SHIELD

## 2017-11-05 ENCOUNTER — Encounter: Payer: Self-pay | Admitting: Hematology and Oncology

## 2017-11-05 ENCOUNTER — Inpatient Hospital Stay (HOSPITAL_BASED_OUTPATIENT_CLINIC_OR_DEPARTMENT_OTHER): Payer: BLUE CROSS/BLUE SHIELD | Admitting: Hematology and Oncology

## 2017-11-05 ENCOUNTER — Other Ambulatory Visit: Payer: Self-pay | Admitting: Hematology and Oncology

## 2017-11-05 VITALS — BP 108/71 | HR 125 | Temp 101.1°F

## 2017-11-05 DIAGNOSIS — R509 Fever, unspecified: Secondary | ICD-10-CM

## 2017-11-05 DIAGNOSIS — L03116 Cellulitis of left lower limb: Secondary | ICD-10-CM | POA: Diagnosis not present

## 2017-11-05 DIAGNOSIS — C6210 Malignant neoplasm of unspecified descended testis: Secondary | ICD-10-CM

## 2017-11-05 DIAGNOSIS — L03119 Cellulitis of unspecified part of limb: Secondary | ICD-10-CM

## 2017-11-05 DIAGNOSIS — R000 Tachycardia, unspecified: Secondary | ICD-10-CM | POA: Diagnosis not present

## 2017-11-05 DIAGNOSIS — K219 Gastro-esophageal reflux disease without esophagitis: Secondary | ICD-10-CM

## 2017-11-05 DIAGNOSIS — Z7901 Long term (current) use of anticoagulants: Secondary | ICD-10-CM | POA: Diagnosis not present

## 2017-11-05 DIAGNOSIS — Z6835 Body mass index (BMI) 35.0-35.9, adult: Secondary | ICD-10-CM

## 2017-11-05 DIAGNOSIS — L519 Erythema multiforme, unspecified: Secondary | ICD-10-CM | POA: Diagnosis present

## 2017-11-05 DIAGNOSIS — Z8547 Personal history of malignant neoplasm of testis: Secondary | ICD-10-CM

## 2017-11-05 DIAGNOSIS — D649 Anemia, unspecified: Secondary | ICD-10-CM

## 2017-11-05 DIAGNOSIS — Z79899 Other long term (current) drug therapy: Secondary | ICD-10-CM

## 2017-11-05 DIAGNOSIS — D509 Iron deficiency anemia, unspecified: Secondary | ICD-10-CM | POA: Diagnosis present

## 2017-11-05 DIAGNOSIS — L080 Pyoderma: Secondary | ICD-10-CM | POA: Diagnosis present

## 2017-11-05 DIAGNOSIS — A419 Sepsis, unspecified organism: Secondary | ICD-10-CM

## 2017-11-05 DIAGNOSIS — D6481 Anemia due to antineoplastic chemotherapy: Secondary | ICD-10-CM

## 2017-11-05 DIAGNOSIS — Z79891 Long term (current) use of opiate analgesic: Secondary | ICD-10-CM

## 2017-11-05 DIAGNOSIS — J45909 Unspecified asthma, uncomplicated: Secondary | ICD-10-CM | POA: Diagnosis not present

## 2017-11-05 DIAGNOSIS — D6959 Other secondary thrombocytopenia: Secondary | ICD-10-CM | POA: Diagnosis present

## 2017-11-05 DIAGNOSIS — R609 Edema, unspecified: Secondary | ICD-10-CM | POA: Diagnosis not present

## 2017-11-05 DIAGNOSIS — B009 Herpesviral infection, unspecified: Secondary | ICD-10-CM | POA: Diagnosis not present

## 2017-11-05 DIAGNOSIS — Z9889 Other specified postprocedural states: Secondary | ICD-10-CM | POA: Diagnosis not present

## 2017-11-05 DIAGNOSIS — Z95828 Presence of other vascular implants and grafts: Secondary | ICD-10-CM

## 2017-11-05 DIAGNOSIS — Z86718 Personal history of other venous thrombosis and embolism: Secondary | ICD-10-CM | POA: Diagnosis not present

## 2017-11-05 DIAGNOSIS — Z809 Family history of malignant neoplasm, unspecified: Secondary | ICD-10-CM

## 2017-11-05 DIAGNOSIS — D61810 Antineoplastic chemotherapy induced pancytopenia: Secondary | ICD-10-CM | POA: Diagnosis present

## 2017-11-05 DIAGNOSIS — B001 Herpesviral vesicular dermatitis: Secondary | ICD-10-CM | POA: Diagnosis not present

## 2017-11-05 DIAGNOSIS — D696 Thrombocytopenia, unspecified: Secondary | ICD-10-CM

## 2017-11-05 DIAGNOSIS — Z9221 Personal history of antineoplastic chemotherapy: Secondary | ICD-10-CM

## 2017-11-05 DIAGNOSIS — F17210 Nicotine dependence, cigarettes, uncomplicated: Secondary | ICD-10-CM

## 2017-11-05 DIAGNOSIS — Z803 Family history of malignant neoplasm of breast: Secondary | ICD-10-CM

## 2017-11-05 DIAGNOSIS — E876 Hypokalemia: Secondary | ICD-10-CM | POA: Diagnosis not present

## 2017-11-05 DIAGNOSIS — Z800 Family history of malignant neoplasm of digestive organs: Secondary | ICD-10-CM | POA: Diagnosis not present

## 2017-11-05 DIAGNOSIS — C6290 Malignant neoplasm of unspecified testis, unspecified whether descended or undescended: Secondary | ICD-10-CM

## 2017-11-05 DIAGNOSIS — R600 Localized edema: Secondary | ICD-10-CM | POA: Diagnosis not present

## 2017-11-05 DIAGNOSIS — I878 Other specified disorders of veins: Secondary | ICD-10-CM | POA: Diagnosis not present

## 2017-11-05 DIAGNOSIS — T451X5A Adverse effect of antineoplastic and immunosuppressive drugs, initial encounter: Principal | ICD-10-CM

## 2017-11-05 DIAGNOSIS — W450XXA Nail entering through skin, initial encounter: Secondary | ICD-10-CM | POA: Diagnosis not present

## 2017-11-05 DIAGNOSIS — W228XXD Striking against or struck by other objects, subsequent encounter: Secondary | ICD-10-CM | POA: Diagnosis not present

## 2017-11-05 DIAGNOSIS — R197 Diarrhea, unspecified: Secondary | ICD-10-CM | POA: Diagnosis not present

## 2017-11-05 DIAGNOSIS — M7989 Other specified soft tissue disorders: Secondary | ICD-10-CM | POA: Diagnosis not present

## 2017-11-05 DIAGNOSIS — S91341A Puncture wound with foreign body, right foot, initial encounter: Secondary | ICD-10-CM | POA: Diagnosis not present

## 2017-11-05 DIAGNOSIS — I776 Arteritis, unspecified: Secondary | ICD-10-CM | POA: Diagnosis present

## 2017-11-05 DIAGNOSIS — S91331A Puncture wound without foreign body, right foot, initial encounter: Secondary | ICD-10-CM | POA: Diagnosis present

## 2017-11-05 DIAGNOSIS — M898X9 Other specified disorders of bone, unspecified site: Secondary | ICD-10-CM | POA: Diagnosis not present

## 2017-11-05 DIAGNOSIS — S91331D Puncture wound without foreign body, right foot, subsequent encounter: Secondary | ICD-10-CM | POA: Diagnosis not present

## 2017-11-05 DIAGNOSIS — I82409 Acute embolism and thrombosis of unspecified deep veins of unspecified lower extremity: Secondary | ICD-10-CM | POA: Diagnosis not present

## 2017-11-05 DIAGNOSIS — Z95820 Peripheral vascular angioplasty status with implants and grafts: Secondary | ICD-10-CM | POA: Diagnosis not present

## 2017-11-05 DIAGNOSIS — C6212 Malignant neoplasm of descended left testis: Secondary | ICD-10-CM | POA: Diagnosis not present

## 2017-11-05 DIAGNOSIS — T451X5D Adverse effect of antineoplastic and immunosuppressive drugs, subsequent encounter: Secondary | ICD-10-CM | POA: Diagnosis not present

## 2017-11-05 DIAGNOSIS — L988 Other specified disorders of the skin and subcutaneous tissue: Secondary | ICD-10-CM | POA: Diagnosis not present

## 2017-11-05 DIAGNOSIS — X58XXXA Exposure to other specified factors, initial encounter: Secondary | ICD-10-CM | POA: Diagnosis not present

## 2017-11-05 DIAGNOSIS — Y939 Activity, unspecified: Secondary | ICD-10-CM | POA: Diagnosis not present

## 2017-11-05 DIAGNOSIS — Z5111 Encounter for antineoplastic chemotherapy: Secondary | ICD-10-CM | POA: Diagnosis not present

## 2017-11-05 DIAGNOSIS — G629 Polyneuropathy, unspecified: Secondary | ICD-10-CM

## 2017-11-05 DIAGNOSIS — R432 Parageusia: Secondary | ICD-10-CM | POA: Diagnosis not present

## 2017-11-05 DIAGNOSIS — W228XXA Striking against or struck by other objects, initial encounter: Secondary | ICD-10-CM | POA: Diagnosis not present

## 2017-11-05 DIAGNOSIS — I82402 Acute embolism and thrombosis of unspecified deep veins of left lower extremity: Secondary | ICD-10-CM | POA: Diagnosis present

## 2017-11-05 DIAGNOSIS — L270 Generalized skin eruption due to drugs and medicaments taken internally: Secondary | ICD-10-CM | POA: Diagnosis present

## 2017-11-05 DIAGNOSIS — Z9079 Acquired absence of other genital organ(s): Secondary | ICD-10-CM | POA: Diagnosis not present

## 2017-11-05 DIAGNOSIS — R51 Headache: Secondary | ICD-10-CM | POA: Diagnosis not present

## 2017-11-05 DIAGNOSIS — L989 Disorder of the skin and subcutaneous tissue, unspecified: Secondary | ICD-10-CM | POA: Diagnosis not present

## 2017-11-05 DIAGNOSIS — I824Y2 Acute embolism and thrombosis of unspecified deep veins of left proximal lower extremity: Secondary | ICD-10-CM | POA: Diagnosis not present

## 2017-11-05 LAB — COMPREHENSIVE METABOLIC PANEL
ALK PHOS: 55 U/L (ref 38–126)
ALT: 10 U/L (ref 0–44)
ALT: 13 U/L (ref 0–44)
AST: 14 U/L — AB (ref 15–41)
AST: 15 U/L (ref 15–41)
Albumin: 3.4 g/dL — ABNORMAL LOW (ref 3.5–5.0)
Albumin: 3.8 g/dL (ref 3.5–5.0)
Alkaline Phosphatase: 51 U/L (ref 38–126)
Anion gap: 5 (ref 5–15)
Anion gap: 7 (ref 5–15)
BILIRUBIN TOTAL: 0.5 mg/dL (ref 0.3–1.2)
BILIRUBIN TOTAL: 0.3 mg/dL (ref 0.3–1.2)
BUN: 14 mg/dL (ref 6–20)
BUN: 20 mg/dL (ref 6–20)
CALCIUM: 8.2 mg/dL — AB (ref 8.9–10.3)
CO2: 24 mmol/L (ref 22–32)
CO2: 30 mmol/L (ref 22–32)
CREATININE: 0.85 mg/dL (ref 0.61–1.24)
Calcium: 8.7 mg/dL — ABNORMAL LOW (ref 8.9–10.3)
Chloride: 101 mmol/L (ref 98–111)
Chloride: 102 mmol/L (ref 98–111)
Creatinine, Ser: 0.88 mg/dL (ref 0.61–1.24)
GFR calc Af Amer: >60 mL/min (ref >60)
GLUCOSE: 103 mg/dL — AB (ref 70–99)
Glucose, Bld: 107 mg/dL — ABNORMAL HIGH (ref 70–99)
Potassium: 3.3 mmol/L — ABNORMAL LOW (ref 3.5–5.1)
Potassium: 3.9 mmol/L (ref 3.5–5.1)
Sodium: 132 mmol/L — ABNORMAL LOW (ref 135–145)
Sodium: 137 mmol/L (ref 135–145)
TOTAL PROTEIN: 6.1 g/dL — AB (ref 6.5–8.1)
TOTAL PROTEIN: 6.6 g/dL (ref 6.5–8.1)

## 2017-11-05 LAB — CBC WITH DIFFERENTIAL/PLATELET
BAND NEUTROPHILS: 8 %
BASOS ABS: 0 10*3/uL (ref 0–0.1)
BLASTS: 0 %
Basophils Absolute: 0 10*3/uL (ref 0–0.1)
Basophils Relative: 0 %
Basophils Relative: 0 %
EOS PCT: 0 %
Eosinophils Absolute: 0 10*3/uL (ref 0–0.7)
Eosinophils Absolute: 0 10*3/uL (ref 0–0.7)
Eosinophils Relative: 0 %
HEMATOCRIT: 20.5 % — AB (ref 40.0–52.0)
HEMATOCRIT: 22.2 % — AB (ref 40.0–52.0)
HEMOGLOBIN: 7.6 g/dL — AB (ref 13.0–18.0)
Hemoglobin: 7 g/dL — ABNORMAL LOW (ref 13.0–18.0)
LYMPHS ABS: 0.7 10*3/uL — AB (ref 1.0–3.6)
LYMPHS ABS: 1 10*3/uL (ref 1.0–3.6)
LYMPHS PCT: 9 %
LYMPHS PCT: 13 %
MCH: 31.6 pg (ref 26.0–34.0)
MCH: 31.6 pg (ref 26.0–34.0)
MCHC: 34 g/dL (ref 32.0–36.0)
MCHC: 34.3 g/dL (ref 32.0–36.0)
MCV: 92.3 fL (ref 80.0–100.0)
MCV: 92.9 fL (ref 80.0–100.0)
MONOS PCT: 5 %
Metamyelocytes Relative: 1 %
Monocytes Absolute: 0.4 10*3/uL (ref 0.2–1.0)
Monocytes Absolute: 0.7 10*3/uL (ref 0.2–1.0)
Monocytes Relative: 9 %
Myelocytes: 2 %
NEUTROS ABS: 6 10*3/uL (ref 1.4–6.5)
NEUTROS ABS: 6.3 10*3/uL (ref 1.4–6.5)
Neutrophils Relative %: 71 %
Neutrophils Relative %: 82 %
OTHER: 0 %
Platelets: 39 10*3/uL — ABNORMAL LOW (ref 150–440)
Platelets: 41 10*3/uL — ABNORMAL LOW (ref 150–440)
Promyelocytes Relative: 0 %
RBC: 2.2 MIL/uL — AB (ref 4.40–5.90)
RBC: 2.4 MIL/uL — AB (ref 4.40–5.90)
RDW: 14.9 % — ABNORMAL HIGH (ref 11.5–14.5)
RDW: 15 % — AB (ref 11.5–14.5)
WBC: 7.4 10*3/uL (ref 3.8–10.6)
WBC: 7.7 10*3/uL (ref 3.8–10.6)
nRBC: 1 /100 WBC — ABNORMAL HIGH

## 2017-11-05 LAB — RETICULOCYTES
RBC.: 2.28 MIL/uL — ABNORMAL LOW (ref 4.40–5.90)
RBC.: 2.17 MIL/uL — ABNORMAL LOW (ref 4.40–5.90)
RETIC COUNT ABSOLUTE: 60.8 10*3/uL (ref 19.0–183.0)
RETIC CT PCT: 2.8 % (ref 0.4–3.1)
RETIC CT PCT: 2.8 % (ref 0.4–3.1)
Retic Count, Absolute: 63.8 10*3/uL (ref 19.0–183.0)

## 2017-11-05 LAB — URINALYSIS, COMPLETE (UACMP) WITH MICROSCOPIC
BACTERIA UA: NONE SEEN
Bilirubin Urine: NEGATIVE
Glucose, UA: NEGATIVE mg/dL
Ketones, ur: NEGATIVE mg/dL
LEUKOCYTES UA: NEGATIVE
Nitrite: NEGATIVE
PH: 5 (ref 5.0–8.0)
Protein, ur: NEGATIVE mg/dL
SPECIFIC GRAVITY, URINE: 1.024 (ref 1.005–1.030)
Squamous Epithelial / LPF: NONE SEEN (ref 0–5)

## 2017-11-05 LAB — FIBRINOGEN: Fibrinogen: 559 mg/dL — ABNORMAL HIGH (ref 210–475)

## 2017-11-05 LAB — IRON AND TIBC
Iron: 13 ug/dL — ABNORMAL LOW (ref 45–182)
SATURATION RATIOS: 6 % — AB (ref 17.9–39.5)
TIBC: 229 ug/dL — ABNORMAL LOW (ref 250–450)
UIBC: 216 ug/dL

## 2017-11-05 LAB — APTT: APTT: 45 s — AB (ref 24–36)

## 2017-11-05 LAB — PREPARE RBC (CROSSMATCH)

## 2017-11-05 LAB — PROCALCITONIN: Procalcitonin: 0.18 ng/mL

## 2017-11-05 LAB — MAGNESIUM: MAGNESIUM: 1.3 mg/dL — AB (ref 1.7–2.4)

## 2017-11-05 LAB — LACTIC ACID, PLASMA
LACTIC ACID, VENOUS: 1.1 mmol/L (ref 0.5–1.9)
LACTIC ACID, VENOUS: 1 mmol/L (ref 0.5–1.9)

## 2017-11-05 LAB — PROTIME-INR
INR: 1.18
PROTHROMBIN TIME: 14.9 s (ref 11.4–15.2)

## 2017-11-05 LAB — FOLATE: FOLATE: 12.6 ng/mL (ref >5.9)

## 2017-11-05 LAB — VITAMIN B12: Vitamin B-12: 835 pg/mL (ref 180–914)

## 2017-11-05 LAB — TSH: TSH: 0.554 u[IU]/mL (ref 0.350–4.500)

## 2017-11-05 LAB — FERRITIN: Ferritin: 414 ng/mL — ABNORMAL HIGH (ref 24–336)

## 2017-11-05 MED ORDER — DIPHENHYDRAMINE HCL 25 MG PO CAPS: 25.0000 mg | ORAL_CAPSULE | Freq: Once | ORAL | Status: DC

## 2017-11-05 MED ORDER — ACETAMINOPHEN 325 MG PO TABS
650.0000 mg | ORAL_TABLET | Freq: Four times a day (QID) | ORAL | Status: DC | PRN
  Administered 2017-11-06: 650 mg via ORAL
  Filled 2017-11-05: qty 2

## 2017-11-05 MED ORDER — MAGNESIUM HYDROXIDE 400 MG/5ML PO SUSP
30.0000 mL | Freq: Every day | ORAL | Status: DC | PRN
  Filled 2017-11-05: qty 30

## 2017-11-05 MED ORDER — DIPHENHYDRAMINE HCL 25 MG PO CAPS
25.0000 mg | ORAL_CAPSULE | Freq: Once | ORAL | Status: AC
  Administered 2017-11-05: 20:00:00 25 mg via ORAL
  Filled 2017-11-05: qty 1

## 2017-11-05 MED ORDER — NICOTINE 14 MG/24HR TD PT24
14.0000 mg | MEDICATED_PATCH | Freq: Every day | TRANSDERMAL | Status: DC
  Administered 2017-11-05 – 2017-11-09 (×5): 14 mg via TRANSDERMAL
  Filled 2017-11-05 (×5): qty 1

## 2017-11-05 MED ORDER — MAGNESIUM SULFATE 2 GM/50ML IV SOLN
2.0000 g | Freq: Once | INTRAVENOUS | Status: AC
  Administered 2017-11-05: 17:00:00 2 g via INTRAVENOUS
  Filled 2017-11-05: qty 50

## 2017-11-05 MED ORDER — SILVER SULFADIAZINE 1 % EX CREA
TOPICAL_CREAM | Freq: Two times a day (BID) | CUTANEOUS | Status: DC
  Administered 2017-11-05 – 2017-11-09 (×9): via TOPICAL
  Filled 2017-11-05: qty 85

## 2017-11-05 MED ORDER — MAGNESIUM OXIDE 400 (241.3 MG) MG PO TABS
400.0000 mg | ORAL_TABLET | Freq: Every day | ORAL | Status: DC
  Administered 2017-11-06 – 2017-11-09 (×4): 400 mg via ORAL
  Filled 2017-11-05 (×4): qty 1

## 2017-11-05 MED ORDER — VANCOMYCIN HCL IN DEXTROSE 1-5 GM/200ML-% IV SOLN
1000.0000 mg | Freq: Once | INTRAVENOUS | Status: AC
  Administered 2017-11-05: 1000 mg via INTRAVENOUS
  Filled 2017-11-05: qty 200

## 2017-11-05 MED ORDER — ACETAMINOPHEN 325 MG PO TABS
650.0000 mg | ORAL_TABLET | Freq: Once | ORAL | Status: AC
  Administered 2017-11-05: 15:00:00 650 mg via ORAL
  Filled 2017-11-05: qty 2

## 2017-11-05 MED ORDER — CLINDAMYCIN PHOSPHATE 600 MG/50ML IV SOLN
600.0000 mg | Freq: Once | INTRAVENOUS | Status: AC
  Administered 2017-11-05: 600 mg via INTRAVENOUS
  Filled 2017-11-05: qty 50

## 2017-11-05 MED ORDER — IBUPROFEN 400 MG PO TABS
600.0000 mg | ORAL_TABLET | Freq: Once | ORAL | Status: AC
  Administered 2017-11-05: 21:00:00 600 mg via ORAL
  Filled 2017-11-05: qty 2

## 2017-11-05 MED ORDER — SODIUM CHLORIDE 0.9% FLUSH: 3.0000 mL | INTRAVENOUS | Status: DC | PRN

## 2017-11-05 MED ORDER — FUROSEMIDE 10 MG/ML IJ SOLN
20.0000 mg | Freq: Once | INTRAMUSCULAR | Status: AC
  Administered 2017-11-05: 15:00:00 20 mg via INTRAVENOUS
  Filled 2017-11-05: qty 2

## 2017-11-05 MED ORDER — METRONIDAZOLE IN NACL 5-0.79 MG/ML-% IV SOLN
500.0000 mg | Freq: Three times a day (TID) | INTRAVENOUS | Status: DC
  Filled 2017-11-05 (×2): qty 100

## 2017-11-05 MED ORDER — ONDANSETRON HCL 4 MG PO TABS: 4.0000 mg | ORAL_TABLET | Freq: Four times a day (QID) | ORAL | Status: DC | PRN

## 2017-11-05 MED ORDER — ACETAMINOPHEN 325 MG PO TABS: 650.0000 mg | ORAL_TABLET | Freq: Once | ORAL | Status: DC

## 2017-11-05 MED ORDER — CLINDAMYCIN PHOSPHATE 900 MG/50ML IV SOLN
900.0000 mg | Freq: Three times a day (TID) | INTRAVENOUS | Status: DC
  Administered 2017-11-05 – 2017-11-09 (×13): 900 mg via INTRAVENOUS
  Filled 2017-11-05 (×16): qty 50

## 2017-11-05 MED ORDER — SODIUM CHLORIDE 0.9% FLUSH: 10.0000 mL | INTRAVENOUS | Status: DC | PRN

## 2017-11-05 MED ORDER — IOPAMIDOL (ISOVUE-370) INJECTION 76%
125.0000 mL | Freq: Once | INTRAVENOUS | Status: AC | PRN
  Administered 2017-11-05: 14:00:00 125 mL via INTRAVENOUS

## 2017-11-05 MED ORDER — SODIUM CHLORIDE 0.9 % IV SOLN
2.0000 g | Freq: Once | INTRAVENOUS | Status: AC
  Administered 2017-11-05: 2 g via INTRAVENOUS
  Filled 2017-11-05: qty 2

## 2017-11-05 MED ORDER — LORATADINE 10 MG PO TABS
10.0000 mg | ORAL_TABLET | Freq: Every day | ORAL | Status: DC
  Administered 2017-11-06 – 2017-11-09 (×4): 10 mg via ORAL
  Filled 2017-11-05 (×4): qty 1

## 2017-11-05 MED ORDER — POLYETHYLENE GLYCOL 3350 17 G PO PACK: 17.0000 g | PACK | Freq: Every day | ORAL | Status: DC | PRN

## 2017-11-05 MED ORDER — SODIUM CHLORIDE 0.9% IV SOLUTION: 250.0000 mL | Freq: Once | INTRAVENOUS | Status: DC

## 2017-11-05 MED ORDER — MORPHINE SULFATE (PF) 2 MG/ML IV SOLN
2.0000 mg | INTRAVENOUS | Status: DC | PRN
  Administered 2017-11-05 – 2017-11-09 (×18): 2 mg via INTRAVENOUS
  Filled 2017-11-05 (×19): qty 1

## 2017-11-05 MED ORDER — PROCHLORPERAZINE MALEATE 10 MG PO TABS
10.0000 mg | ORAL_TABLET | Freq: Four times a day (QID) | ORAL | Status: DC | PRN
  Filled 2017-11-05: qty 1

## 2017-11-05 MED ORDER — SODIUM CHLORIDE 0.9 % IV BOLUS (SEPSIS): 1000.0000 mL | Freq: Once | INTRAVENOUS | Status: DC

## 2017-11-05 MED ORDER — HEPARIN SOD (PORK) LOCK FLUSH 100 UNIT/ML IV SOLN: 250.0000 [IU] | INTRAVENOUS | Status: DC | PRN

## 2017-11-05 MED ORDER — TETANUS-DIPHTH-ACELL PERTUSSIS 5-2.5-18.5 LF-MCG/0.5 IM SUSP
0.5000 mL | Freq: Once | INTRAMUSCULAR | Status: AC
  Administered 2017-11-05: 0.5 mL via INTRAMUSCULAR
  Filled 2017-11-05: qty 0.5

## 2017-11-05 MED ORDER — ONDANSETRON HCL 4 MG/2ML IJ SOLN: 4.0000 mg | Freq: Four times a day (QID) | INTRAMUSCULAR | Status: DC | PRN

## 2017-11-05 MED ORDER — SODIUM CHLORIDE 0.9 % IV BOLUS (SEPSIS): 500.0000 mL | Freq: Once | INTRAVENOUS | Status: DC

## 2017-11-05 MED ORDER — HEPARIN SOD (PORK) LOCK FLUSH 100 UNIT/ML IV SOLN
500.0000 [IU] | Freq: Every day | INTRAVENOUS | Status: AC | PRN
  Administered 2017-11-09: 17:00:00 500 [IU]

## 2017-11-05 MED ORDER — RIVAROXABAN 20 MG PO TABS
20.0000 mg | ORAL_TABLET | Freq: Every day | ORAL | Status: DC
  Filled 2017-11-05: qty 1

## 2017-11-05 MED ORDER — SILVER SULFADIAZINE 1 % EX CREA
TOPICAL_CREAM | Freq: Once | CUTANEOUS | Status: AC
  Administered 2017-11-05: 02:00:00 via TOPICAL

## 2017-11-05 MED ORDER — RIVAROXABAN 20 MG PO TABS
20.0000 mg | ORAL_TABLET | Freq: Every day | ORAL | Status: DC
  Administered 2017-11-05: 20 mg via ORAL
  Filled 2017-11-05: qty 1

## 2017-11-05 MED ORDER — SODIUM CHLORIDE 0.9 % IV BOLUS
1000.0000 mL | Freq: Once | INTRAVENOUS | Status: AC
  Administered 2017-11-05: 1000 mL via INTRAVENOUS

## 2017-11-05 MED ORDER — OXYCODONE HCL 5 MG PO TABS
5.0000 mg | ORAL_TABLET | Freq: Three times a day (TID) | ORAL | Status: DC | PRN
  Administered 2017-11-05: 5 mg via ORAL
  Filled 2017-11-05: qty 1

## 2017-11-05 MED ORDER — SILVER SULFADIAZINE 1 % EX CREA
TOPICAL_CREAM | Freq: Two times a day (BID) | CUTANEOUS | Status: DC
  Administered 2017-11-06: 10:00:00 via TOPICAL
  Filled 2017-11-05: qty 85

## 2017-11-05 MED ORDER — ACETAMINOPHEN 650 MG RE SUPP: 650.0000 mg | Freq: Four times a day (QID) | RECTAL | Status: DC | PRN

## 2017-11-05 MED ORDER — OXYCODONE HCL ER 10 MG PO T12A
10.0000 mg | EXTENDED_RELEASE_TABLET | Freq: Two times a day (BID) | ORAL | Status: DC
  Administered 2017-11-05 – 2017-11-09 (×8): 10 mg via ORAL
  Filled 2017-11-05 (×8): qty 1

## 2017-11-05 MED ORDER — SODIUM CHLORIDE 0.9% IV SOLUTION
Freq: Once | INTRAVENOUS | Status: AC
  Administered 2017-11-05: 15:00:00 via INTRAVENOUS

## 2017-11-05 MED ORDER — DEXAMETHASONE 4 MG PO TABS
4.0000 mg | ORAL_TABLET | Freq: Every day | ORAL | Status: DC
  Administered 2017-11-06 – 2017-11-09 (×4): 4 mg via ORAL
  Filled 2017-11-05 (×4): qty 1

## 2017-11-05 MED ORDER — SODIUM CHLORIDE 0.9 % IV BOLUS (SEPSIS)
1000.0000 mL | Freq: Once | INTRAVENOUS | Status: AC
  Administered 2017-11-05: 1000 mL via INTRAVENOUS

## 2017-11-05 MED ORDER — DIPHENHYDRAMINE HCL 25 MG PO CAPS
25.0000 mg | ORAL_CAPSULE | Freq: Once | ORAL | Status: AC
  Administered 2017-11-05: 25 mg via ORAL
  Filled 2017-11-05: qty 1

## 2017-11-05 MED ORDER — DULOXETINE HCL 30 MG PO CPEP
60.0000 mg | ORAL_CAPSULE | Freq: Every day | ORAL | Status: DC
  Administered 2017-11-06 – 2017-11-09 (×4): 60 mg via ORAL
  Filled 2017-11-05 (×4): qty 2

## 2017-11-05 MED ORDER — ACETAMINOPHEN 500 MG PO TABS: 500.0000 mg | ORAL_TABLET | Freq: Four times a day (QID) | ORAL | Status: DC | PRN

## 2017-11-05 MED ORDER — HYDROMORPHONE HCL 1 MG/ML IJ SOLN
1.0000 mg | Freq: Once | INTRAMUSCULAR | Status: AC
  Administered 2017-11-05: 1 mg via INTRAVENOUS
  Filled 2017-11-05: qty 1

## 2017-11-05 MED ORDER — ONDANSETRON 8 MG PO TBDP: 8.0000 mg | ORAL_TABLET | Freq: Three times a day (TID) | ORAL | Status: DC | PRN

## 2017-11-05 MED ORDER — ACETAMINOPHEN 325 MG PO TABS
650.0000 mg | ORAL_TABLET | Freq: Once | ORAL | Status: AC
  Administered 2017-11-05: 20:00:00 650 mg via ORAL
  Filled 2017-11-05: qty 2

## 2017-11-05 MED ORDER — POTASSIUM CHLORIDE 20 MEQ PO PACK
20.0000 meq | PACK | Freq: Two times a day (BID) | ORAL | Status: DC
  Administered 2017-11-05 – 2017-11-07 (×6): 20 meq via ORAL
  Filled 2017-11-05 (×7): qty 1

## 2017-11-05 MED ORDER — HEPARIN SOD (PORK) LOCK FLUSH 100 UNIT/ML IV SOLN
500.0000 [IU] | Freq: Every day | INTRAVENOUS | Status: DC | PRN
  Filled 2017-11-05: qty 5

## 2017-11-05 MED ORDER — POTASSIUM CHLORIDE CRYS ER 20 MEQ PO TBCR
40.0000 meq | EXTENDED_RELEASE_TABLET | Freq: Once | ORAL | Status: AC
  Administered 2017-11-05: 40 meq via ORAL
  Filled 2017-11-05: qty 2

## 2017-11-05 MED ORDER — SODIUM CHLORIDE 0.9 % IV SOLN
INTRAVENOUS | Status: DC
  Administered 2017-11-05: 20:00:00 via INTRAVENOUS

## 2017-11-05 MED ORDER — GABAPENTIN 300 MG PO CAPS
300.0000 mg | ORAL_CAPSULE | Freq: Every day | ORAL | Status: DC
  Administered 2017-11-05 – 2017-11-08 (×4): 300 mg via ORAL
  Filled 2017-11-05 (×4): qty 1

## 2017-11-05 MED ORDER — SODIUM CHLORIDE 0.9 % IV SOLN
2.0000 g | Freq: Two times a day (BID) | INTRAVENOUS | Status: DC
  Administered 2017-11-05 – 2017-11-08 (×7): 2 g via INTRAVENOUS
  Filled 2017-11-05 (×10): qty 2

## 2017-11-05 NOTE — Progress Notes (Addendum)
Gave pt premeds for blood, pt temp 99.6.  When rechecked to start blood, temp 101.8.  Spoke with Dr Jannifer Franklin.  Motrin to be given and can transfuse once temp is under control. Dorna Bloom RN [phone] Spoke with Dr Jannifer Franklin.  Pt temp 99.9.  Ok to transfuse. Dorna Bloom RN

## 2017-11-05 NOTE — Consult Note (Signed)
Date of Admission:  11/05/2017            Reason for Consult: left leg cellulitis   Referring Provider:Melissa Corcoran MD  History obtained from patientand reviewed care every where records from [facility] HPI: Edgar [name] is a 27 y.o. male with h/o testicular cancer diagnosed in sept  2017 s/p left orchidectomys/p 3 cycles of BEP chemo , recurrence in June 2019, started TIP chemo, July had DVT of the left leg with severe venous edema , blistering lesions on his left leg,and underwent percutaneous transluminal angioplasty and mechanical thrombectomy of the left common femoral, external iliac and common iliac veins and  stent placement of the left common iliac vein and left external iliac vein. Left leg cellulitis and lesions on his left thigh is  Was recently in [facility] last week to get chemo cycle 3 Is now admitted with fever of 1 day duration and painful swelling left leg X 1 day Pt says after his discharge from hospital on 8/13 was doing well and was relaxing at home. He saw his oncologist as Op on 8/16. Got transfusion on 11/01/17. Last night he had a fever and came to the ED and blood cultures were drawn and he was given a dose of IV and was discharged to follow up with Dr.C today. He went to her clinic this morning and was admitted because of swelling of left leg and similar lesions appearing on his left calf area   Medical history  Diagnosed testicular cancer  in sept  2017 s/p left orchidectomys/p 3 cycles of BEP chemoPt on  June 28th  went to [facility] with swelling of left leg and workup  revealed retroperitoneal adenopathy causing compression of the left iliac vein and associated thrombus He got IV heparin and discharged on xarelto. He started first cycle of TIP (paclitaxel/ifosfamide/cisplatin)  on 09/21/17- a 5 day cycle every 21 days. He took Decadron x 3 days after chemo.  On day 7 (09/27/2017), he began Levaquin x 7 days.  He received Udencya on 09/27/2017. He was seen as  follow up on 7/22 at [facility] and the note says he had marked swelling of the left leg with pitting edema. On 7/24 patient presented to [facility] ED with fever of 102 and pain left leg, along with worsening swelling. He was diagnosed with cellulitis and sent home on clindamycin. Blood culture was negative. He saw Dr.Corcoran ( onc) on July 26th and she noted blistering lesions on his left leg and admitted him to the hopsital for Iv antibiotics  He received IV vanco and cefepime. On 10/13/17 he underwent the following by Vascular surgery 1. Ultrasound guidance for vascular access to theright common femoral vein and left popliteal vein 2. Catheter placement into the inferior vena cava for placement of IVC filter 3. Inferior venacavogram 4. Placement of a Denali IVC filter infrarenal 5. Introduction catheter into venous system second order catheter placement left popliteal approach 6.Percutaneous transluminal angioplasty and stent placement of the left common iliac vein and left external iliac vein 7. Percutaneous transluminal angioplasty of the left common femoral vein 8.   Mechanical thrombectomy of the left common femoral, external iliac and common iliac veins with the penumbra cat 8 device 9.   Excisional debridement of necrotic skin and dermis multiple blistered ulcerations left thigh  He improved significantly after that and was sent home on 10/16/17 on doxy, levaquin and flagyl.and xarelto. The wound on the leg was being treated with silversulphadiazine and  xeroform. He was admitted to [facility] 8/8-8/13 for 3rd round of chemo and I had seen him on 8/13 and antibiotics were discontinued as there was no infection in the leg. That time the skin lesions on his thigh looked like healing superficial burns- see below 10/26/17    Pt says since going home he did not go to the beach, get any animal bites or scratches.  Had a nail that stuck him thru his shoe on the rt foot few days ago. That foot  was not hurting  Past Medical History:  Diagnosis Date  . Asthma    AS A CHILD-NO INHALERS  . Cancer ([place])    testicular cancer 11/2016 per pt   . DVT (deep venous thrombosis) ([place])   . GERD (gastroesophageal reflux disease)    TUMS PRN    Past Surgical History:  Procedure Laterality Date  . ANKLE FRACTURE SURGERY Right 2004  . ORCHIECTOMY Left 11/26/2015   Procedure: ORCHIECTOMY;  Surgeon: [name], MD;  Location: [facility] ORS;  Service: Urology;  Laterality: Left;  radical/Inguinal approach  . PERIPHERAL VASCULAR CATHETERIZATION N/A 12/16/2015   Procedure: [name] Cath Insertion;  Surgeon: [name], MD;  Location: [place] CV LAB;  Service: Cardiovascular;  Laterality: N/A;  . PERIPHERAL VASCULAR THROMBECTOMY Left 10/13/2017   Procedure: PERIPHERAL VASCULAR THROMBECTOMY;  Surgeon: [name], MD;  Location: [place] CV LAB;  Service: Cardiovascular;  Laterality: Left;  . WISDOM TOOTH EXTRACTION  2017    Social History   Tobacco Use  . Smoking status: Current Every Day Smoker    Packs/day: 0.50    Years: 8.00    Pack years: 4.00    Types: Cigarettes  . Smokeless tobacco: Former Systems developer    Types: Snuff  Substance Use Topics  . Alcohol use: No  . Drug use: Yes    Types: Marijuana    Family History  Problem Relation Age of Onset  . Skin cancer Mother   . Breast cancer Maternal Grandmother   . Colon cancer Maternal Grandfather   . Diabetes Maternal Grandfather   . Emphysema Father   . Mental illness Sister        anxiety  . Stroke Paternal Grandmother   . Prostate cancer Neg Hx   . Kidney cancer Neg Hx   . Bladder Cancer Neg Hx      Home meds gabapentin Mag ox oxycontin xarelto cymbalta dexamethasone   Abtx:  Anti-infectives (From admission, onward)   None       Review of Systems: Constitutional-fatigue,  Fever, no  weight loss Eyes- no blurred vision ENT- no sore throat, or nasal congestion CVS- no chest pain or SOB RS- no  cough, no wheeze GI No  abdominal pain No constipation or diarrhea GU- no hematuria or dysuria MSK- b/l leg swelling- left > rt Painful left calf Skin- lesions on  left thigh improving Neurologic- no headache or dizziness Lymphatic- edema left leg  Psychiatric- mood stable       OBJECTIVE: Blood pressure 132/78, pulse (!) 102, temperature 98.9 F (37.2 C), temperature source Oral, resp. rate 20, SpO2 98 %.  Physical Exam  Constitutional: He is oriented to person, place, and time. He appears well-developed. No distress.  HENT:  Head: Normocephalic.  Mouth/Throat: Oropharynx is clear and moist.  Eyes: Pupils are equal, round, and reactive to light.  Neck: Neck supple.  Cardiovascular: Intact distal pulses.  No murmur heard. S1s2, tachycardia  Pulmonary/Chest: He has wheezes. He has no  rales.  Abdominal: Soft. There is no tenderness.  Musculoskeletal: He exhibits edema (left leg).  Left leg is swollen Erythematous left calf with exquisite tenderness developing circular lesions calf Also has circular erythema over the rt thigh and left thigh which are not tender  Lymphadenopathy:    He has no cervical adenopathy.  Neurological: He is alert and oriented to person, place, and time. No cranial nerve deficit.  Skin: Skin is warm.  Psychiatric: He has a normal mood and affect.   skin Healing circular lesions left thigh with no erythema  Or induration   rt thigh lateral aspect- 5 cm circular erythema with central nodularity and tenderness   Rt foot -site of nail prick - no erythema or tenderness     Left thigh- above knee- new area of circualr erythema with central nodularity    Back of left calf has erythematous area with central nodualrity and darkening    Previous lesions on his thigh are sensitive    Trunk, back, upper extremities normal Papular eruption over the nape of the neck      Lab Results CBC    Component Value Date/Time   WBC 7.4  11/05/2017 0943   RBC 2.20 (L) 11/05/2017 0943   RBC 2.28 (L) 11/05/2017 0943   HGB 7.0 (L) 11/05/2017 0943   HCT 20.5 (L) 11/05/2017 0943   PLT 39 (L) 11/05/2017 0943   MCV 92.9 11/05/2017 0943   MCH 31.6 11/05/2017 0943   MCHC 34.0 11/05/2017 0943   RDW 14.9 (H) 11/05/2017 0943   LYMPHSABS 0.7 (L) 11/05/2017 0943   MONOABS 0.7 11/05/2017 0943   EOSABS 0.0 11/05/2017 0943   BASOSABS 0.0 11/05/2017 0943   CMP Latest Ref Rng & Units 11/05/2017 11/04/2017 11/01/2017  Glucose 70 - 99 mg/dL 107(H) 103(H) 106(H)  BUN 6 - 20 mg/dL 14 20 12   Creatinine 0.61 - 1.24 mg/dL 0.85 0.88 0.80  Sodium 135 - 145 mmol/L 132(L) 137 140  Potassium 3.5 - 5.1 mmol/L 3.9 3.3(L) 3.6  Chloride 98 - 111 mmol/L 101 102 105  CO2 22 - 32 mmol/L 24 30 28   Calcium 8.9 - 10.3 mg/dL 8.2(L) 8.7(L) 9.1  Total Protein 6.5 - 8.1 g/dL 6.1(L) 6.6 -  Total Bilirubin 0.3 - 1.2 mg/dL 0.5 0.3 -  Alkaline Phos 38 - 126 U/L 51 55 -  AST 15 - 41 U/L 14(L) 15 -  ALT 0 - 44 U/L 10 13 -   8/22 /19 BC- NG 8/23 [facility]   Radiology Reviewed xray of the foot and CXR personally Scattered faint radiopaque densities about the skin and soft tissues subjacent to the first-interspace may be site of puncture wound. No radiographic findings of osteomyelitis  Assessment: 27 y.o. male with h/o testicular cancer diagnosed in sept  2017 s/p left orchidectomys/p 3 cycles of BEP chemo , recurrence in June 2019, started TIP chemo, July had DVT of the left leg with severe venous edema , blistering lesions on his left leg,and underwent percutaneous transluminal angioplasty and mechanical thrombectomy of the left common femoral, external iliac and common iliac veins and  stent placement of the left common iliac vein and left external iliac vein. Left leg cellulitis and lesions on his left thigh is  Was recently in [facility] last week to get chemo cycle 3 Is now admitted with fever of 1 day duration and painful swelling left leg X 1 day    Discoid Skin  lesions localized  only to  the legs- more  on the left and one on the right thigh 1) New discoid painful lesion left calf with surrounding erythema New discoid lesions left thigh and rt thigh- painful These lesions are atypical and has a broad DD  The current Lesions and the old lesions are target like  and could be  D.D Timing a week after chemo   -Fixed drug eruptions with erythema multiforme like picture -Bullous fixed drug eruption -Erythema multiforme  ecthyma gangrenosum Nodular vasculitis  Usually FDE does not cause fever  But with EM like lesions fever can happen- need to r/o drug reaction-especially to  Paclitaxel    With fever and tenderness need to consider cellulitis and treat with antibiotics Cefepime + clindamycin  Dermatology consult and skin biopsy recommended   2) healing superficial target  like lesions on the left thigh 98 weeks old- sensitive now   3) Swelling of the left leg due to DVT from the retroperitoneal LN compressing on his veins-s/p mechanical thrombectomy and stent placement - he then developed blistering circular lesions on his thighs 2 weeks after his first cycle of chemo and has been treated with antibiotics. No cultures were sent form the lesions before Venous edema secondary to extensive Left DVT It is unusual for venous edema /congestion to cause circular lesions.   4) testicular carcinoma s/p left orchidectomy, recurrence of TIP  5) Anemia- Hb 7- will be getting PRBC  6) Thrombocytopenia due to chemo- management as per Dr.Corcoran   7) nail injury thru the shoe of the rt foot- Cover for pseudomonas with cefepime No obvious collection or infection   Plan: Cefepime + clinda Follow blood cultures Derm consult Biopsy   Discussed with patient and his parents in detail. Spoke to [place]  Thank you  for this interesting consult.  ID will follow him peripherally over the weekend- call the oncall person if needed thru the  operator  Tsosie Billing, MD  11/05/2017, 2:36 PM

## 2017-11-05 NOTE — Progress Notes (Signed)
Pharmacy Antibiotic Note  Edgar [name] is a 27 y.o. male admitted on 11/05/2017 with sepsis.  Pharmacy has been consulted for cefepime dosing.  Plan: Cefepime 2 gm IV Q12H     Temp (24hrs), Avg:99.7 F (37.6 C), Min:98.9 F (37.2 C), Max:101.1 F (38.4 C)  Recent Labs  Lab 11/01/17 1039 11/04/17 2340 11/05/17 0943 11/05/17 1055  WBC 0.7* 7.7 7.4  --   CREATININE 0.80 0.88 0.85  --   LATICACIDVEN  --  1.0  --  1.1    Estimated Creatinine Clearance: 177.6 mL/min (by C-G formula based on SCr of 0.85 mg/dL).    No Known Allergies  Antimicrobials this admission:   Dose adjustments this admission:   Microbiology results:  BCx:   UCx:    Sputum:    MRSA PCR:   Thank you for allowing pharmacy to be a part of this patient's care.  Laural Benes, Pharm.D., BCPS Clinical Pharmacist 11/05/2017 2:53 PM

## 2017-11-05 NOTE — Consult Note (Signed)
[facility] Nurse wound consult note Reason for Consult: LLE wound Patient has been seen by the [facility] nurse team several times in the past. Unclear etiology of the wounds on the LLE. He has scarring of the left inner thigh which the patient stated started as "blisters". The are perfectly round lesion but they are intact at this time with new epithelial tissue. Patient's mother and father are at the bedside. The new lesion is on the posterior left calf,very tender to the touch, intact skin, erythematous, indurated. Patient reports that the lesions on his thigh started this way.  Unclear etiology, I would suspect related to chemotherapy. Wound type: inflammatory response, cellulitis left posterior calf.  Pressure Injury POA: NA Measurement:4cm x 4cm x 0, Patient also reports vascular US just performed. Reviewed results. No evidence of DVT in the LLE. Wound bed:non blanchable area that is very tender and darker centrally.  No blistering at this time. Drainage (amount, consistency, odor) none Periwound: edema with palpable distal pulses.He is noted to have LE edema in the RLE as well.  Dressing procedure/placement/frequency: Topical silvadene only, cover with dry dressing, kerlix and ACE wrap from toes to the knee. OK to dc silvadene to the left inner thigh, these lesions are intact at this time.  He is ordered compression hose at home, but he reports the calf is so tender and that the stocking was  "really hurting" at home. Will use ACE if patient can tolerate for light compression to the LE.   Discussed POC with patient and bedside nurse.  Re consult if needed, will not follow at this time. Thanks  Lulu Hirschmann [employer], RN,CWOCN, CNS, [place] [phone])

## 2017-11-05 NOTE — H&P (Signed)
[facility] NAME: Edgar [name]    MR#:  [id number]  DATE OF BIRTH:  1991/03/03  DATE OF ADMISSION:  11/05/2017  PRIMARY CARE PHYSICIAN: Patient, No Pcp Per   REQUESTING/REFERRING PHYSICIAN:   CHIEF COMPLAINT:  No chief complaint on file.   HISTORY OF PRESENT ILLNESS: Edgar [name]  is a 27 y.o. male with a known history of testicular cancer on chemotherapy, GERD, DVT w/ IVC filter in place, recent mechanical thrombectomy status post stent placement by vascular surgery with subsequent development of left thigh skin blistering/ulcerations, history of left thigh cellulitis-hospitalized twice for this issue/discharge August 13, accepted as a direct admission by oncology for concern for sepsis, left leg cellulitis, patient noted to be febrile to 101/tachycardic to 120 with low normal blood pressure in the oncology clinic, patient accepted as a direct admission, work-up thus far noted for magnesium 1.3, sodium 132, left lower extremity ultrasound negative for clot, hemoglobin 7 down from 7.6, platelet count 39, patient only complaining of left lower extremity pain, patient now being admitted for acute possible sepsis secondary to left lower extremity recurrent leg cellulitis.  PAST MEDICAL HISTORY:   Past Medical History:  Diagnosis Date  . Asthma    AS A CHILD-NO INHALERS  . Cancer ([place])    testicular cancer 11/2016 per pt   . DVT (deep venous thrombosis) ([place])   . GERD (gastroesophageal reflux disease)    TUMS PRN    PAST SURGICAL HISTORY:  Past Surgical History:  Procedure Laterality Date  . ANKLE FRACTURE SURGERY Right 2004  . ORCHIECTOMY Left 11/26/2015   Procedure: ORCHIECTOMY;  Surgeon: [name], MD;  Location: [facility] ORS;  Service: Urology;  Laterality: Left;  radical/Inguinal approach  . PERIPHERAL VASCULAR CATHETERIZATION N/A 12/16/2015   Procedure: [name] Cath Insertion;  Surgeon: [name], MD;  Location: [place] [facility];  Service: Cardiovascular;  Laterality: N/A;  . PERIPHERAL VASCULAR THROMBECTOMY Left 10/13/2017   Procedure: PERIPHERAL VASCULAR THROMBECTOMY;  Surgeon: [name], MD;  Location: [facility];  Service: Cardiovascular;  Laterality: Left;  . WISDOM TOOTH EXTRACTION  2017    SOCIAL HISTORY:  Social History   Tobacco Use  . Smoking status: Current Every Day Smoker    Packs/day: 0.50    Years: 8.00    Pack years: 4.00    Types: Cigarettes  . Smokeless tobacco: Former Systems developer    Types: Snuff  Substance Use Topics  . Alcohol use: No    FAMILY HISTORY:  Family History  Problem Relation Age of Onset  . Skin cancer Mother   . Breast cancer Maternal Grandmother   . Colon cancer Maternal Grandfather   . Diabetes Maternal Grandfather   . Emphysema Father   . Mental illness Sister        anxiety  . Stroke Paternal Grandmother   . Prostate cancer Neg Hx   . Kidney cancer Neg Hx   . Bladder Cancer Neg Hx     DRUG ALLERGIES: No Known Allergies  REVIEW OF SYSTEMS:   CONSTITUTIONAL: No fever, fatigue or weakness.  EYES: No blurred or double vision.  EARS, NOSE, AND THROAT: No tinnitus or ear pain.  RESPIRATORY: No cough, shortness of breath, wheezing or hemoptysis.  CARDIOVASCULAR: No chest pain, orthopnea, edema.  GASTROINTESTINAL: No nausea, vomiting, diarrhea or abdominal pain.  GENITOURINARY: No dysuria, hematuria.  ENDOCRINE: No polyuria, nocturia,  HEMATOLOGY: No anemia, easy bruising or bleeding SKIN: Subacute/chronic  left thigh skin ulcerations, left leg pain with redness MUSCULOSKELETAL: No joint pain or arthritis.   NEUROLOGIC: No tingling, numbness, weakness.  PSYCHIATRY: No anxiety or depression.   MEDICATIONS AT HOME:  Prior to Admission medications   Medication Sig Start Date End Date Taking? Authorizing Provider  acetaminophen (TYLENOL) 500 MG tablet Take 500-1,000 mg by mouth every 6 (six) hours as needed for mild pain or fever.      [provider]  dexamethasone (DECADRON) 4 MG tablet Take 4 mg by mouth daily. 09/26/17 09/26/18  [provider]  DULoxetine (CYMBALTA) 60 MG capsule Take 1 tablet by mouth daily. 10/05/17 10/05/18  [provider]  gabapentin (NEURONTIN) 300 MG capsule Take 1 capsule (300 mg total) by mouth at bedtime. 10/16/17   Gladstone Lighter, MD  loratadine (CLARITIN) 10 MG tablet Take 1 tablet (10 mg total) by mouth daily. 10/29/17   Karen Kitchens, NP  magnesium oxide (MAG-OX) 400 MG tablet Take 1 tablet by mouth daily. 10/04/17   [provider]  nicotine (NICODERM CQ - DOSED IN MG/24 HR) 7 mg/24hr patch Place 7 mg onto the skin daily.  09/15/17   [provider]  ondansetron (ZOFRAN-ODT) 4 MG disintegrating tablet Take 2 tablets by mouth every 8 (eight) hours as needed. 09/26/17   [provider]  oxyCODONE (OXY IR/ROXICODONE) 5 MG immediate release tablet Take 1 tablet (5 mg total) by mouth every 8 (eight) hours as needed for severe pain or breakthrough pain. 10/29/17   Karen Kitchens, NP  oxyCODONE (OXYCONTIN) 10 mg 12 hr tablet Take 1 tablet (10 mg total) by mouth every 12 (twelve) hours. 10/29/17   Karen Kitchens, NP  polyethylene glycol (MIRALAX / GLYCOLAX) packet Take 17 g by mouth daily as needed for mild constipation. 10/16/17 11/15/17  Gladstone Lighter, MD  potassium chloride (KLOR-CON) 20 MEQ packet Take 20 mEq by mouth 2 (two) times daily.     [provider]  prochlorperazine (COMPAZINE) 10 MG tablet Take 1 tablet by mouth every 6 (six) hours as needed (nausea/vomiting).  09/26/17   [provider]  silver sulfADIAZINE (SILVADENE) 1 % cream Apply topically 2 (two) times daily. Apply to blisters on left thigh and cover with nonadherent dressing 10/29/17   Karen Kitchens, NP  XARELTO 20 MG TABS tablet Take 1 tablet by mouth daily. 09/26/17   [provider]      PHYSICAL EXAMINATION:   VITAL SIGNS: Blood pressure 132/78, pulse (!)  102, temperature (!) 101.3 F (38.5 C), temperature source Oral, resp. rate 20, SpO2 98 %.  GENERAL:  27 y.o.-year-old patient lying in the bed with no acute distress.  Morbid obesity EYES: Pupils equal, round, reactive to light and accommodation. No scleral icterus. Extraocular muscles intact.  HEENT: Head atraumatic, normocephalic. Oropharynx and nasopharynx clear.  NECK:  Supple, no jugular venous distention. No thyroid enlargement, no tenderness.  LUNGS: Normal breath sounds bilaterally, no wheezing, rales,rhonchi or crepitation. No use of accessory muscles of respiration.  CARDIOVASCULAR: S1, S2 normal. No murmurs, rubs, or gallops.  ABDOMEN: Soft, nontender, nondistended. Bowel sounds present. No organomegaly or mass.  EXTREMITIES: Left leg cellulitis with edema, pain with palpation, noted chronic thigh medial skin ulcerations/lesions   NEUROLOGIC: Cranial nerves II through XII are intact. Muscle strength 5/5 in all extremities. Sensation intact. Gait not checked.  PSYCHIATRIC: The patient is alert and oriented x 3.  SKIN: No obvious rash, lesion, or ulcer.   LABORATORY PANEL:  CBC Recent Labs  Lab 11/01/17 1039 11/04/17 2340 11/05/17 0943  WBC 0.7* 7.7 7.4  HGB 7.2* 7.6* 7.0*  HCT 21.5* 22.2* 20.5*  PLT 50* 41* 39*  MCV 91.9 92.3 92.9  MCH 30.6 31.6 31.6  MCHC 33.3 34.3 34.0  RDW 14.8* 15.0* 14.9*  LYMPHSABS 0.4* 1.0 0.7*  MONOABS 0.3 0.4 0.7  EOSABS 0.0 0.0 0.0  BASOSABS 0.0 0.0 0.0   ------------------------------------------------------------------------------------------------------------------  Chemistries  Recent Labs  Lab 11/01/17 1039 11/04/17 2340 11/05/17 0943  NA 140 137 132*  K 3.6 3.3* 3.9  CL 105 102 101  CO2 28 30 24   GLUCOSE 106* 103* 107*  BUN 12 20 14   CREATININE 0.80 0.88 0.85  CALCIUM 9.1 8.7* 8.2*  MG  --   --  1.3*  AST  --  15 14*  ALT  --  13 10  ALKPHOS  --  55 51  BILITOT  --  0.3 0.5    ------------------------------------------------------------------------------------------------------------------ estimated creatinine clearance is 177.6 mL/min (by C-G formula based on SCr of 0.85 mg/dL). ------------------------------------------------------------------------------------------------------------------ Recent Labs    11/05/17 1510  TSH 0.554     Coagulation profile Recent Labs  Lab 11/05/17 1055  INR 1.18   ------------------------------------------------------------------------------------------------------------------- No results for input(s): DDIMER in the last 72 hours. -------------------------------------------------------------------------------------------------------------------  Cardiac Enzymes No results for input(s): CKMB, TROPONINI, MYOGLOBIN in the last 168 hours.  Invalid input(s): CK ------------------------------------------------------------------------------------------------------------------ Invalid input(s): POCBNP  ---------------------------------------------------------------------------------------------------------------  Urinalysis    Component Value Date/Time   COLORURINE YELLOW (A) 11/05/2017 0000   APPEARANCEUR CLEAR (A) 11/05/2017 0000   APPEARANCEUR Clear 11/20/2015 1331   LABSPEC 1.024 11/05/2017 0000   PHURINE 5.0 11/05/2017 0000   GLUCOSEU NEGATIVE 11/05/2017 0000   HGBUR SMALL (A) 11/05/2017 0000   BILIRUBINUR NEGATIVE 11/05/2017 0000   BILIRUBINUR Negative 11/20/2015 1331   KETONESUR NEGATIVE 11/05/2017 0000   PROTEINUR NEGATIVE 11/05/2017 0000   NITRITE NEGATIVE 11/05/2017 0000   LEUKOCYTESUR NEGATIVE 11/05/2017 0000   LEUKOCYTESUR Negative 11/20/2015 1331     RADIOLOGY: Dg Chest 2 View  Result Date: 11/04/2017 CLINICAL DATA:  Fever, cancer patient EXAM: CHEST - 2 VIEW COMPARISON:  CT 09/30/2016, radiograph 10/06/2017 FINDINGS: Right-sided central venous port tip over the proximal right atrium. Low lung  volumes. No focal opacity or pleural effusion. Stable cardiomediastinal silhouette. No pneumothorax. Partially visualized IVC filter. IMPRESSION: No active cardiopulmonary disease. Electronically Signed   By: Donavan Foil M.D.   On: 11/04/2017 23:43   Ct Angio Ao+bifem W & Or Wo Contrast  Result Date: 11/05/2017 CLINICAL DATA:  27 year old male with sudden onset of a cold, painful left lower extremity. Past medical history of colon cancer and left lower extremity DVT. EXAM: CT ANGIOGRAPHY OF ABDOMINAL AORTA WITH ILIOFEMORAL RUNOFF TECHNIQUE: Multidetector CT imaging of the abdomen, pelvis and lower extremities was performed using the standard protocol during bolus administration of intravenous contrast. Multiplanar CT image reconstructions and MIPs were obtained to evaluate the vascular anatomy. CONTRAST:  140mL ISOVUE-370 IOPAMIDOL (ISOVUE-370) INJECTION 76% COMPARISON:  Prior CT scan of the abdomen and pelvis 10/10/2017 FINDINGS: VASCULAR Aorta: Normal caliber aorta without aneurysm, dissection, vasculitis or significant stenosis. Celiac: Patent without evidence of aneurysm, dissection, vasculitis or significant stenosis. SMA: Patent without evidence of aneurysm, dissection, vasculitis or significant stenosis. Renals: Both renal arteries are patent without evidence of aneurysm, dissection, vasculitis, fibromuscular dysplasia or significant stenosis. IMA: Patent without evidence of aneurysm, dissection, vasculitis or significant stenosis. RIGHT Lower Extremity Inflow: Common, internal and external iliac arteries are  patent without evidence of aneurysm, dissection, vasculitis or significant stenosis. Outflow: Common, superficial and profunda femoral arteries and the popliteal artery are patent without evidence of aneurysm, dissection, vasculitis or significant stenosis. Runoff: Patent three vessel runoff to the ankle. LEFT Lower Extremity Inflow: Common, internal and external iliac arteries are patent without  evidence of aneurysm, dissection, vasculitis or significant stenosis. Outflow: Common, superficial and profunda femoral arteries and the popliteal artery are patent without evidence of aneurysm, dissection, vasculitis or significant stenosis. Runoff: Patent three vessel runoff to the ankle. Veins: Infrarenal IVC filter in place. No evidence of complication. The IVC and pelvic veins are all patent including the left external and common iliac venous stent. No evidence of left lower extremity DVT. Review of the MIP images confirms the above findings. NON-VASCULAR Lower chest: No acute abnormality. Hepatobiliary: No focal liver abnormality is seen. No gallstones, gallbladder wall thickening, or biliary dilatation. Pancreas: Unremarkable. No pancreatic ductal dilatation or surrounding inflammatory changes. Spleen: Normal in size without focal abnormality. Adrenals/Urinary Tract: Adrenal glands are unremarkable. Kidneys are normal, without renal calculi, focal lesion, or hydronephrosis. Bladder is unremarkable. Stomach/Bowel: Stomach is within normal limits. Appendix appears normal. No evidence of bowel wall thickening, distention, or inflammatory changes. Lymphatic: Stable right para-aortic lymph node measuring approximately 9 x 9 mm (image 84 series 4); left para-aortic lymph node measures slightly smaller at 8 mm compared to 12 mm previously. Left para-aortic lymph node more inferiorly is also decreasing in size presently at 15 mm compared to 17 mm previously. Left common iliac station node measures approximately 2.5 x 1.6 cm compared to 3.3 x 1.6 cm. Left external iliac station node measures approximately 3.3 x 2.6 cm compared to 4.0 x 3.7 cm. Reproductive: Surgical changes suggest left orchiectomy. Persistent postoperative changes present in the region the left inguinal canal. Other: No abdominal wall hernia or abnormality. No abdominopelvic ascites. Musculoskeletal: No acute or significant osseous findings.  Significantly decreased left lower extremity swelling. There is some persistent skin thickening along the medial aspect of the left upper thigh. IMPRESSION: VASCULAR 1. No acute arterial or venous abnormality. 2. Patent left common and external iliac venous stents. 3. Well-positioned potentially retrievable infrarenal IVC filter. NON-VASCULAR 1. Interval response to therapy with decreasing left pelvic and retroperitoneal/para-aortic lymphadenopathy. 2. Postoperative changes in the left inguinal and left groin region as expected. 3. Significantly decreased left lower extremity edema. 4. There is some persistent skin thickening in the medial aspect of the left thigh in the region of the reported wounds. Electronically Signed   By: Jacqulynn Cadet M.D.   On: 11/05/2017 16:07   US Venous Img Lower Bilateral  Result Date: 11/05/2017 CLINICAL DATA:  Bilateral lower extremity edema EXAM: BILATERAL LOWER EXTREMITY VENOUS DOPPLER ULTRASOUND TECHNIQUE: Gray-scale sonography with compression, as well as color and duplex ultrasound, were performed to evaluate the deep venous system from the level of the common femoral vein through the popliteal and proximal calf veins. COMPARISON:  10/09/2017 FINDINGS: Normal compressibility of the common femoral, superficial femoral, and popliteal veins, as well as the proximal calf veins. No filling defects to suggest DVT on grayscale or color Doppler imaging. Doppler waveforms show normal direction of venous flow, normal respiratory phasicity and response to augmentation. IMPRESSION: No evidence of  lower extremity deep vein thrombosis. Electronically Signed   By: Lucrezia Europe M.D.   On: 11/05/2017 13:03   Dg Foot Complete Right  Result Date: 11/05/2017 CLINICAL DATA:  Stepped on a nail 3 days ago. Evaluate  for osteomyelitis. EXAM: RIGHT FOOT COMPLETE - 3+ VIEW COMPARISON:  None. FINDINGS: Faint radiopaque densities about the skin and soft tissues subjacent to the first-second  interspace may be site of puncture wound. No soft tissue air. No fracture or dislocation. No osseous erosions or bony destructive change. No periosteal reaction. Diffuse soft tissue edema. Screws in the distal tibia partially included. IMPRESSION: Scattered faint radiopaque densities about the skin and soft tissues subjacent to the first-interspace may be site of puncture wound. No radiographic findings of osteomyelitis. Electronically Signed   By: Jeb Levering M.D.   On: 11/05/2017 01:04    EKG: Orders placed or performed in visit on 11/05/17  . EKG 12-Lead    IMPRESSION AND PLAN: *Acute probable septicemia secondary to left leg cellulitis *Acute on chronic left lower extremity cellulitis *Chronic testicular cancer on chemotherapy *Chronic left thigh skin lesions *Chronic tobacco smoking abuse/dependency *Acute hypomagnesemia *Acute worsening iron deficiency anemia/thrombocytopenia due to chemotherapy  Admit to regular nursing for bed on our sepsis protocol, infectious disease input appreciated, continue cefepime/clindamycin, follow-up on cultures, IV fluids for rehydration, blood transfusion, CBC in the morning, oncology to see, vascular surgery input appreciated, adult pain protocol, wound care nurse consulted, replete magnesium, continue close medical monitoring   All the records are reviewed and case discussed with ED provider. Management plans discussed with the patient, family and they are in agreement.  CODE STATUS:full    Code Status Orders  (From admission, onward)         Start     Ordered   11/05/17 1443  Full code  Continuous     11/05/17 1442        Code Status History    Date Active Date Inactive Code Status Order ID Comments User Context   10/21/2017 1110 10/26/2017 1635 Full Code [id number]  Vaughan Basta, MD Inpatient   10/08/2017 1621 10/16/2017 1836 Full Code [id number]  Vaughan Basta, MD Inpatient       TOTAL TIME TAKING CARE OF THIS  PATIENT: 45 minutes.    Avel Peace Gaven Eugene M.D on 11/05/2017   Between 7am to 6pm - Pager - [phone]  After 6pm go to [email or web address] - password [email or web address] [facility]  Office  [phone]  CC: Primary care physician; Patient, No Pcp Per   Note: This dictation was prepared with Dragon dictation along with smaller phrase technology. Any transcriptional errors that result from this process are unintentional.

## 2017-11-05 NOTE — Discharge Instructions (Signed)
You have received 1 dose of IV clindamycin in the emergency department for cellulitis of your left lower leg.  Please discuss this with your oncologist to see if she will want to continue antibiotic treatment.  Blood and urine cultures are pending.  Return to the ER for worsening symptoms, persistent vomiting, difficulty breathing or other concerns.

## 2017-11-05 NOTE — Progress Notes (Signed)
[facility] day:  11/05/2017  Chief Complaint: Edgar [name] is a 27 y.o. male with recurrent testicular cancer who is seen for assessment on day 16 s/p cycle #2 TIP chemotherapy.  HPI: The patient was last seen in the medical oncology clinic on 10/29/2017 with Honor Loh, NP.  At that time, he was doing well. He noted some minor fatigue and exertional shortness of breath. He was eating well; weight up 7 pounds. He was experiencing dysgeusia. He had moderate pain today rated 5/10 despite use of prescribed analgesics. He had significant bone pain following Udencya injection despite use of recommended loratadine. Exam reveals decreased swelling to LEFT lower extremity. 6 lesions to thigh had markedly improved with daily wound care. WBC was 2500 (ANC 1600).  Hemoglobin was 8.0, hematocrit 23.4, and platelets 148,000.  Potassium was 3.3.  Calcium was 8.7.  Magnesium was normal at 1.9.  BUN 27 and creatinine 0.92 (163.6 mL/min).  CBC has been followed: 10/29/2017:  Hematocrit 23.4, hemoglobin 8.0, platelets 148,000, WBC 2400 with an ANC of 1600 11/01/2017:  Hematocrit 21.5, hemoglobin 7.2, platelets 50,000, WBC 700 with an ANC of 0. 11/04/2017:  Hematocrit 22.2, hemoglobin 7.6, platelets 41,000, WBC 7700 with an ANC of 6300.  He received 1 units of PRBCs on 11/01/2017.  He was seen in the ER on 11/04/2017 with fever of 100F.  Took Aleve approximately 7 PM.  Also today noted warmth and swelling to his left lower leg. Admits to not wearing his compression stockings today and was on his feet more than usual.  Also states he stepped on a nail 3 days ago with his right foot.  Denies cough, congestion, chest pain, shortness of breath, abdominal pain, nausea, vomiting, dysuria or diarrhea.  Plain films revealed no osteomyelitis.  He received a tetanus injection.  He received clindamycin IV x 1.  Symptomatically, patient is not feeling well today. He notes that  he had been feeling "great' until the night before last. BILATERAL lower extremities are once swelling again (L>R). Patient stepped on a nail on 11/02/2017. Patient was wearing shoes and the nail reportedly went through his shoe. Nail was was outside. Foot has been swollen since. He mounted a fever response again last night (tmax 100.7). Patient has cellulitic changes to the posterior aspect of his LEFT calf. There are early circular lesions with centralized areas of darkening. Patient with petechiae to BILATERAL lower extremities.   Patient notes that nausea is controlled. He denies vomiting. Patient has intermittent episodes of diarrhea. Energy is low overall, but notes that he is improving "some". Patient denies shortness of breath. He presents to the clinic today febrile (tmax 101.1) and TACHYcardic to 125. He is pale in color. Patient reports generalized pain rated 7/10 despite prescribed interventions.   Patient advises that he maintains an adequate appetite. He is eating "ok". No new weight obtained by nursing staff today in clinic. Patient is taking oral magnesium supplement at home once daily.    Past Medical History:  Diagnosis Date  . Asthma    AS A CHILD-NO INHALERS  . Cancer ([place])    testicular cancer 11/2016 per pt   . DVT (deep venous thrombosis) ([place])   . GERD (gastroesophageal reflux disease)    TUMS PRN    Past Surgical History:  Procedure Laterality Date  . ANKLE FRACTURE SURGERY Right 2004  . ORCHIECTOMY Left 11/26/2015   Procedure: ORCHIECTOMY;  Surgeon: [name], MD;  Location: [facility];  Service: Urology;  Laterality: Left;  radical/Inguinal approach  . PERIPHERAL VASCULAR CATHETERIZATION N/A 12/16/2015   Procedure: [name] Cath Insertion;  Surgeon: [name], MD;  Location: [facility];  Service: Cardiovascular;  Laterality: N/A;  . PERIPHERAL VASCULAR THROMBECTOMY Left 10/13/2017   Procedure: PERIPHERAL VASCULAR THROMBECTOMY;  Surgeon: [name],  MD;  Location: [facility];  Service: Cardiovascular;  Laterality: Left;  . WISDOM TOOTH EXTRACTION  2017    Family History  Problem Relation Age of Onset  . Skin cancer Mother   . Breast cancer Maternal Grandmother   . Colon cancer Maternal Grandfather   . Diabetes Maternal Grandfather   . Emphysema Father   . Mental illness Sister        anxiety  . Stroke Paternal Grandmother   . Prostate cancer Neg Hx   . Kidney cancer Neg Hx   . Bladder Cancer Neg Hx     Social History:  reports that he has been smoking cigarettes. He has a 4.00 pack-year smoking history. He has quit using smokeless tobacco.  His smokeless tobacco use included snuff. He reports that he has current or past drug history. Drug: Marijuana. He reports that he does not drink alcohol.  He smokes 1/2 pack a day.  He smokes marijuana.  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  He has a 81 year-old-daughter named [name]. The patient's mother's cell phone number is [phone].  The patient lives in [place]. He is accompanied by his mother today.   Allergies: No Known Allergies  Current Medications: Current Outpatient Medications  Medication Sig Dispense Refill  . acetaminophen (TYLENOL) 500 MG tablet Take 500-1,000 mg by mouth every 6 (six) hours as needed for mild pain or fever.     Marland Kitchen dexamethasone (DECADRON) 4 MG tablet Take 4 mg by mouth daily.    . DULoxetine (CYMBALTA) 60 MG capsule Take 1 tablet by mouth daily.    Marland Kitchen gabapentin (NEURONTIN) 300 MG capsule Take 1 capsule (300 mg total) by mouth at bedtime. 30 capsule 0  . loratadine (CLARITIN) 10 MG tablet Take 1 tablet (10 mg total) by mouth daily. 90 tablet 1  . magnesium oxide (MAG-OX) 400 MG tablet Take 1 tablet by mouth daily.    . nicotine (NICODERM CQ - DOSED IN MG/24 HR) 7 mg/24hr patch Place 7 mg onto the skin daily.     . ondansetron (ZOFRAN-ODT) 4 MG disintegrating tablet Take 2 tablets by mouth every 8 (eight) hours as needed.     Marland Kitchen oxyCODONE (OXY IR/ROXICODONE) 5 MG immediate release tablet Take 1 tablet (5 mg total) by mouth every 8 (eight) hours as needed for severe pain or breakthrough pain. 30 tablet 0  . oxyCODONE (OXYCONTIN) 10 mg 12 hr tablet Take 1 tablet (10 mg total) by mouth every 12 (twelve) hours. 30 tablet 0  . polyethylene glycol (MIRALAX / GLYCOLAX) packet Take 17 g by mouth daily as needed for mild constipation. 14 each 0  . potassium chloride (KLOR-CON) 20 MEQ packet Take 20 mEq by mouth 2 (two) times daily.     . prochlorperazine (COMPAZINE) 10 MG tablet Take 1 tablet by mouth every 6 (six) hours as needed (nausea/vomiting).     . silver sulfADIAZINE (SILVADENE) 1 % cream Apply topically 2 (two) times daily. Apply to blisters on left thigh and cover with nonadherent dressing 1000 g 2  . XARELTO 20 MG TABS tablet Take 1 tablet  by mouth daily.     No current facility-administered medications for this visit.    Facility-Administered Medications Ordered in Other Visits  Medication Dose Route Frequency Provider Last Rate Last Dose  . heparin lock flush 100 unit/mL  500 Units Intravenous Once Corcoran, [name], MD      . sodium chloride flush (NS) 0.9 % injection 10 mL  10 mL Intravenous PRN Nolon Stalls C, MD   10 mL at 10/21/17 1008    Review of Systems  Constitutional: Positive for chills, fever (tmax 101.1) and malaise/fatigue. Negative for diaphoresis and weight loss (?? no new weight obtained).       "I am not feeling too good today".   HENT: Positive for tinnitus (slight - stable recent audiogram normal). Negative for nosebleeds and sore throat.        Dysgeusia  Eyes: Negative.   Respiratory: Positive for shortness of breath (exertional). Negative for cough, hemoptysis and sputum production.   Cardiovascular: Positive for palpitations (TACHYcardic to 125) and leg swelling (BILATERAL). Negative for chest pain, orthopnea and PND.  Gastrointestinal: Positive for nausea (controlled). Negative  for abdominal pain, blood in stool, constipation, diarrhea, melena and vomiting.  Genitourinary: Negative for dysuria, frequency, hematuria and urgency.  Musculoskeletal: Positive for myalgias. Negative for back pain, falls and joint pain.       Bone pain follow Udencya  Skin: Negative for itching and rash.       Cellulitic changes to LLE; circular lesions with central darkening to calf area. Healing wounds to LEFT thigh. Puncture wound to RIGHT foot.   Neurological: Positive for sensory change (minimal neuropathy to hands and toes -  stable) and weakness (generalized). Negative for dizziness, tremors and headaches.  Endo/Heme/Allergies: Bruises/bleeds easily.  Psychiatric/Behavioral: Negative for depression, memory loss and suicidal ideas. The patient is not nervous/anxious and does not have insomnia.   All other systems reviewed and are negative.  Performance status (ECOG): 2 - Symptomatic, <50% confined to bed  Vital Signs BP 108/71 (BP Location: Left Arm, Patient Position: Sitting)   Pulse (!) 125   Temp (!) 101.1 F (38.4 C) (Tympanic) Comment: 99.9 Oral  Physical Exam  Constitutional: He is oriented to person, place, and time and well-developed, well-nourished, and in no distress. He appears unhealthy. He has a sickly appearance.  HENT:  Head: Normocephalic and atraumatic. Hair is abnormal (chemotherapy induced alopecia).  Eyes: Pupils are equal, round, and reactive to light. EOM are normal. No scleral icterus.  Brown eyes  Neck: Normal range of motion. Neck supple. No tracheal deviation present. No thyromegaly present.  Cardiovascular: Regular rhythm, normal heart sounds, intact distal pulses and normal pulses. Tachycardia present. Exam reveals no gallop and no friction rub.  No murmur heard. Pulmonary/Chest: Effort normal and breath sounds normal. No respiratory distress. He has no wheezes. He has no rales.  Abdominal: Soft. Bowel sounds are normal. He exhibits no distension.  There is no tenderness.  Musculoskeletal: Normal range of motion. He exhibits edema (BILATERAL lower extremities (L>R) and tenderness.  Lymphadenopathy:    He has no cervical adenopathy.    He has no axillary adenopathy.       Right: No inguinal and no supraclavicular adenopathy present.       Left: No inguinal and no supraclavicular adenopathy present.  Neurological: He is alert and oriented to person, place, and time.  Skin: Skin is warm and dry. Bruising (scattered BLE), lesion (6 circular lesions to LEFT inner and posterior thigh continue to  improve with daily wound care) and rash (developing petechiae to BLE) noted. There is erythema (inner aspect of LEFT calf; developing circular lesions with central darkening). There is pallor.  #1: 5 circular lesions to LEFT inner thigh, and 1 circular lesions to LEFT posterior thigh - erythematous with appearance similar to an open blister/burn.  #2: Erythema and developing circular lesions to LEFT calf that are acute. Lesions with central darkening.  #3: Puncture wound to the dorsal aspect of RIGHT foot just inferior to the great toe. Black in color and tender.   Psychiatric: Mood, affect and judgment normal.  Nursing note and vitals reviewed.  Imaging studies: 11/28/2015:  Chest, abdomen, and pelvic CT revealed no mediastinal adenopathy or suspicious pulmonary nodules.   There were 2 prominent left periaortic retroperitoneal lymph nodes (1.7 cm and 0.9 cm) concerning for testicular nodal metastasis.  There were no abnormal lymph nodes above the renal veins.  There were postsurgical change in the left hemiscrotum and left inguinal canal.  There was a 3.3 cm soft tissue abnormality anterior to the left iliopsoas muscle which could represent a metastatic lymph node (N2 lesion).  12/17/2015:  Head MRI revealed no evidence of metastatic disease.   03/30/2016:  Chest, abdomen, and pelvic CT revealed interval resolution of left abdominal/pelvic lymphadenopathy.   There was no residual metastatic disease. 09/30/2016:  Chest, abdomen, and pelvic CT revealed no evidence of recurrent or metastatic disease.  09/10/2017:  Bilateral lower extremity duplex revealed evidence of obstruction in the left CFV, SFJ, and proximal femoral vein.  09/10/2017:  CT pelvic venogram revealed multiple retroperitoneal masses including large mass within the left iliacus concerning for metastatic disease in the setting of prior testicular cancer.  Mass in the left iliacus caused significant narrowing of the proximal left external iliac vein. There was filling defect within the distal left external iliac vein extending into the common femoral and proximal superficial femoral veins consistent with thrombus  09/11/2017:  Chest, abdomen, and pelvic CT revealed enlarged and morphologically abnormal 4.3 x 4.9 cm left pelvic sidewall and retroperitoneal lymph nodes (measuring up to 2.9 cm) concerning for metastatic disease given history of testicular cancer. Biopsy was suggested for confirmation. Prominent left inguinal lymph nodes could be metastatic or reactive in nature.  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  There was anasarca/swelling of the left lower extremity.  There was no evidence of metastatic disease in the chest. 09/13/2017:  Head MRI revealed no evidence of metastatic disease.   Orders Only on 11/05/2017  Component Date Value Ref Range Status  . Retic Ct Pct 11/05/2017 2.8  0.4 - 3.1 % Final  . RBC. 11/05/2017 2.28* 4.40 - 5.90 MIL/uL Final  . Retic Count, Absolute 11/05/2017 63.8  19.0 - 183.0 K/uL Final   Performed at [facility], [address] [place], [place] [address]  . aPTT 11/05/2017 45* 24 - 36 seconds Final   Comment:        IF BASELINE aPTT IS ELEVATED, SUGGEST PATIENT RISK ASSESSMENT BE USED TO DETERMINE APPROPRIATE ANTICOAGULANT THERAPY. Performed at [facility],  [address], [place], [place] [address]   . Prothrombin Time 11/05/2017 14.9  11.4 - 15.2 seconds Final  . INR 11/05/2017 1.18   Final   Performed at [facility], [address] [place], [place] [address]  . Lactic Acid, Venous 11/05/2017 1.1  0.5 - 1.9 mmol/L Final   Performed at [facility], [address] [place], [place] [address]  Office Visit on 11/05/2017  Component Date Value Ref Range Status  . Procalcitonin 11/05/2017 0.18  ng/mL Final   Comment:        Interpretation: PCT (Procalcitonin) <= 0.5 ng/mL: Systemic infection (sepsis) is not likely. Local bacterial infection is possible. (NOTE)       Sepsis PCT Algorithm           Lower Respiratory Tract                                      Infection PCT Algorithm    ----------------------------     ----------------------------         PCT < 0.25 ng/mL                PCT < 0.10 ng/mL         Strongly encourage             Strongly discourage   discontinuation of antibiotics    initiation of antibiotics    ----------------------------     -----------------------------       PCT 0.25 - 0.50 ng/mL            PCT 0.10 - 0.25 ng/mL               OR       >80% decrease in PCT            Discourage initiation of                                            antibiotics      Encourage discontinuation           of antibiotics    ----------------------------     -----------------------------         PCT >= 0.50 ng/mL              PCT 0.26 - 0.50 ng/mL               AND                                 <80% decrease in PCT             Encourage initiation of                                             antibiotics       Encourage continuation           of antibiotics    ----------------------------     -----------------------------        PCT >= 0.50 ng/mL                  PCT > 0.50 ng/mL               AND         increase in PCT                  Strongly encourage  initiation of antibiotics    Strongly encourage escalation           of antibiotics                                     -----------------------------                                           PCT <= 0.25 ng/mL                                                 OR                                        > 80% decrease in PCT                                     Discontinue / Do not initiate                                             antibiotics Performed at [facility], [address] [place], [place] [address]   . Fibrinogen 11/05/2017 559* 210 - 475 mg/dL Final   Performed at [facility], [address] [place], [place] [address]  Appointment on 11/05/2017  Component Date Value Ref Range Status  . ABO/RH(D) 11/05/2017 A POS   Final  . Antibody Screen 11/05/2017 NEG   Final  . Sample Expiration 11/05/2017 11/08/2017   Final  . Unit Number 11/05/2017 [id number]   Final  . Blood Component Type 11/05/2017 RBC, LR IRR   Final  . Unit division 11/05/2017 00   Final  . Status of Unit 11/05/2017 ALLOCATED   Final  . Unit tag comment 11/05/2017 IRRADIATED PRODUCT   Final  . Transfusion Status 11/05/2017 OK TO TRANSFUSE   Final  . Crossmatch Result 11/05/2017    Final                   Value:Compatible Performed at [facility], [address], [place], [place] [address]   . Magnesium 11/05/2017 1.3* 1.7 - 2.4 mg/dL Final   Performed at [facility], [address] [place], [place] [address]  . WBC 11/05/2017 7.4  3.8 - 10.6 K/uL Final  . RBC 11/05/2017 2.20* 4.40 - 5.90 MIL/uL Final  . Hemoglobin 11/05/2017 7.0* 13.0 - 18.0 g/dL Final  . HCT 11/05/2017 20.5* 40.0 - 52.0 % Final  . MCV 11/05/2017 92.9  80.0 - 100.0 fL Final  . MCH 11/05/2017 31.6  26.0 - 34.0 pg Final  . MCHC 11/05/2017 34.0  32.0 - 36.0 g/dL Final  . RDW 11/05/2017 14.9* 11.5 - 14.5 % Final  . Platelets 11/05/2017 39* 150 - 440 K/uL Final  . Neutrophils Relative % 11/05/2017 82   % Final  . Neutro Abs 11/05/2017 6.0  1.4 - 6.5  K/uL Final  . Lymphocytes Relative 11/05/2017 9  % Final  . Lymphs Abs 11/05/2017 0.7* 1.0 - 3.6 K/uL Final  . Monocytes Relative 11/05/2017 9  % Final  . Monocytes Absolute 11/05/2017 0.7  0.2 - 1.0 K/uL Final  . Eosinophils Relative 11/05/2017 0  % Final  . Eosinophils Absolute 11/05/2017 0.0  0 - 0.7 K/uL Final  . Basophils Relative 11/05/2017 0  % Final  . Basophils Absolute 11/05/2017 0.0  0 - 0.1 K/uL Final   Performed at [facility], [address], [place], [place] [address]  . Sodium 11/05/2017 132* 135 - 145 mmol/L Final  . Potassium 11/05/2017 3.9  3.5 - 5.1 mmol/L Final  . Chloride 11/05/2017 101  98 - 111 mmol/L Final  . CO2 11/05/2017 24  22 - 32 mmol/L Final  . Glucose, Bld 11/05/2017 107* 70 - 99 mg/dL Final  . BUN 11/05/2017 14  6 - 20 mg/dL Final  . Creatinine, Ser 11/05/2017 0.85  0.61 - 1.24 mg/dL Final  . Calcium 11/05/2017 8.2* 8.9 - 10.3 mg/dL Final  . Total Protein 11/05/2017 6.1* 6.5 - 8.1 g/dL Final  . Albumin 11/05/2017 3.4* 3.5 - 5.0 g/dL Final  . AST 11/05/2017 14* 15 - 41 U/L Final  . ALT 11/05/2017 10  0 - 44 U/L Final  . Alkaline Phosphatase 11/05/2017 51  38 - 126 U/L Final  . Total Bilirubin 11/05/2017 0.5  0.3 - 1.2 mg/dL Final  . GFR calc non Af Amer 11/05/2017 >60  >60 mL/min Final  . GFR calc Af Amer 11/05/2017 >60  >60 mL/min Final   Comment: (NOTE) The eGFR has been calculated using the CKD EPI equation. This calculation has not been validated in all clinical situations. eGFR's persistently <60 mL/min signify possible Chronic Kidney Disease.   Georgiann Hahn gap 11/05/2017 7  5 - 15 Final   Performed at [facility], [address] [place], [place] [address]  . Blood Product Unit Number 11/05/2017 [id number]   Final  . Unit Type and Rh 11/05/2017 9500   Final  . Blood Product Expiration Date 11/05/2017 [id number]   Final  Orders Only on 11/05/2017  Component Date Value Ref  Range Status  . Order Confirmation 11/05/2017    Final                   Value:ORDER PROCESSED BY BLOOD BANK Performed at [facility], [address], [place], [place] [address]   Admission on 11/04/2017, Discharged on 11/05/2017  Component Date Value Ref Range Status  . Specimen Description 11/04/2017 BLOOD RIGHT ANTECUBITAL   Final  . Special Requests 11/04/2017 BOTTLES DRAWN AEROBIC AND ANAEROBIC Blood Culture results may not be optimal due to an excessive volume of blood received in culture bottles   Final  . Culture 11/04/2017    Final                   Value:NO GROWTH < 12 HOURS Performed at [facility], [address], [place], [place] [address]   . Report Status 11/04/2017 PENDING   Incomplete  . Specimen Description 11/05/2017 BLOOD LEFT ANTECUBITAL   Final  . Special Requests 11/05/2017 BOTTLES DRAWN AEROBIC AND ANAEROBIC Blood Culture results may not be optimal due to an inadequate volume of blood received in culture bottles   Final  . Culture 11/05/2017    Final  Value:NO GROWTH < 12 HOURS Performed at [facility], [address] [place], [place] [address]   . Report Status 11/05/2017 PENDING   Incomplete  . WBC 11/04/2017 7.7  3.8 - 10.6 K/uL Final  . RBC 11/04/2017 2.40* 4.40 - 5.90 MIL/uL Final  . Hemoglobin 11/04/2017 7.6* 13.0 - 18.0 g/dL Final  . HCT 11/04/2017 22.2* 40.0 - 52.0 % Final  . MCV 11/04/2017 92.3  80.0 - 100.0 fL Final  . MCH 11/04/2017 31.6  26.0 - 34.0 pg Final  . MCHC 11/04/2017 34.3  32.0 - 36.0 g/dL Final  . RDW 11/04/2017 15.0* 11.5 - 14.5 % Final  . Platelets 11/04/2017 41* 150 - 440 K/uL Final  . Neutrophils Relative % 11/04/2017 71  % Final  . Lymphocytes Relative 11/04/2017 13  % Final  . Monocytes Relative 11/04/2017 5  % Final  . Eosinophils Relative 11/04/2017 0  % Final  . Basophils Relative 11/04/2017 0  % Final  . Band Neutrophils 11/04/2017 8  % Final  . Metamyelocytes Relative  11/04/2017 1  % Final  . Myelocytes 11/04/2017 2  % Final  . Promyelocytes Relative 11/04/2017 0  % Final  . Blasts 11/04/2017 0  % Final  . nRBC 11/04/2017 1* 0 /100 WBC Final  . Other 11/04/2017 0  % Final  . Neutro Abs 11/04/2017 6.3  1.4 - 6.5 K/uL Final  . Lymphs Abs 11/04/2017 1.0  1.0 - 3.6 K/uL Final  . Monocytes Absolute 11/04/2017 0.4  0.2 - 1.0 K/uL Final  . Eosinophils Absolute 11/04/2017 0.0  0 - 0.7 K/uL Final  . Basophils Absolute 11/04/2017 0.0  0 - 0.1 K/uL Final  . RBC Morphology 11/04/2017 MIXED RBC POPULATION   Final   POLYCHROMASIA PRESENT  . WBC Morphology 11/04/2017 DOHLE BODIES   Final   Comment: TOXIC GRANULATION Performed at [facility], [address] [place], [place] [address]   . Sodium 11/04/2017 137  135 - 145 mmol/L Final  . Potassium 11/04/2017 3.3* 3.5 - 5.1 mmol/L Final  . Chloride 11/04/2017 102  98 - 111 mmol/L Final  . CO2 11/04/2017 30  22 - 32 mmol/L Final  . Glucose, Bld 11/04/2017 103* 70 - 99 mg/dL Final  . BUN 11/04/2017 20  6 - 20 mg/dL Final  . Creatinine, Ser 11/04/2017 0.88  0.61 - 1.24 mg/dL Final  . Calcium 11/04/2017 8.7* 8.9 - 10.3 mg/dL Final  . Total Protein 11/04/2017 6.6  6.5 - 8.1 g/dL Final  . Albumin 11/04/2017 3.8  3.5 - 5.0 g/dL Final  . AST 11/04/2017 15  15 - 41 U/L Final  . ALT 11/04/2017 13  0 - 44 U/L Final  . Alkaline Phosphatase 11/04/2017 55  38 - 126 U/L Final  . Total Bilirubin 11/04/2017 0.3  0.3 - 1.2 mg/dL Final  . GFR calc non Af Amer 11/04/2017 >60  >60 mL/min Final  . GFR calc Af Amer 11/04/2017 >60  >60 mL/min Final   Comment: (NOTE) The eGFR has been calculated using the CKD EPI equation. This calculation has not been validated in all clinical situations. eGFR's persistently <60 mL/min signify possible Chronic Kidney Disease.   Georgiann Hahn gap 11/04/2017 5  5 - 15 Final   Performed at [facility], [address] [place], [place] [address]  . Lactic Acid, Venous 11/04/2017 1.0   0.5 - 1.9 mmol/L Final   Performed at [facility] [address] [place], [place] [address]  .  Color, Urine 11/05/2017 YELLOW* YELLOW Final  . APPearance 11/05/2017 CLEAR* CLEAR Final  . Specific Gravity, Urine 11/05/2017 1.024  1.005 - 1.030 Final  . pH 11/05/2017 5.0  5.0 - 8.0 Final  . Glucose, UA 11/05/2017 NEGATIVE  NEGATIVE mg/dL Final  . Hgb urine dipstick 11/05/2017 SMALL* NEGATIVE Final  . Bilirubin Urine 11/05/2017 NEGATIVE  NEGATIVE Final  . Ketones, ur 11/05/2017 NEGATIVE  NEGATIVE mg/dL Final  . Protein, ur 11/05/2017 NEGATIVE  NEGATIVE mg/dL Final  . Nitrite 11/05/2017 NEGATIVE  NEGATIVE Final  . Leukocytes, UA 11/05/2017 NEGATIVE  NEGATIVE Final  . RBC / HPF 11/05/2017 0-5  0 - 5 RBC/hpf Final  . WBC, UA 11/05/2017 0-5  0 - 5 WBC/hpf Final  . Bacteria, UA 11/05/2017 NONE SEEN  NONE SEEN Final  . Squamous Epithelial / LPF 11/05/2017 NONE SEEN  0 - 5 Final  . Mucus 11/05/2017 PRESENT   Final   Performed at [facility], [address], [place], [place] [address]    Assessment:  Edgar [name] is a 27 y.o. male with recurrent testicular cancer.  He presented with a left lower extremity DVT on 09/10/2017.  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  Clinical/pathologic stage is T1 N1-2 S0 M0.  He received 3 cycles of  BEP (12/30/2015 - 02/24/2016).  He received OnPro Neulasta with cycle #2 and #3.  He declined evaluation for retroperitoneal lymph node dissection (RPLND).  He declined sperm banking.  Pre surgery labs on 11/20/2015 revealed an AFP of 411.9, beta-HCG 2,669, and LDH 522 (121-224).    Tumor markers have been followed:   AFP was 411.9 (0-8.3) on 11/20/2015, 11.2 on 12/19/2015, 6.3 on 12/23/2015, 1.3 on 01/27/2016, 1.8 on  02/17/2016, 1.9 on 03/30/2016, 1.4 on 05/27/2016, 1.0 on 07/29/2016, 1.4 on 10/14/2016, 3.3 on 12/24/2016, 1 on 09/10/2017, 1 on 09/20/2017, and 2.6 on 10/21/2017.    Beta-HCG was 2,669 (0-3) on 11/20/2015, 2 on 12/19/2015, 1 on 12/23/2015, < 1 on 01/27/2016, < 1 on 02/17/2016, < 1 on 03/30/2016, < 1 on 05/27/2016,  < 1 on 07/29/2016, < 1 on 10/14/2016, and 3 on 12/24/2016, < 5 on 09/11/2017,  < 5 on 09/20/2017, and < 1 on 10/21/2017.  LDH was 522 (98-192) on 11/20/2015, 179 on 12/19/2015, 160 on 12/23/2015, 202 on 01/27/2016, 156 on 02/10/2016, 161 on 02/17/2016, 172 on 03/30/2016, 178 on 05/27/2016, 150 on 07/29/2016, 152 on 10/14/2016, 145 on 12/24/2016, 1557 on 09/11/2017, 916 on 09/20/2017, and 144 on 10/21/2017.  Chest, abdomen, and pelvic CT on 09/11/2017 revealed enlarged and morphologically abnormal 4.3 x 4.9 cm left pelvic sidewall and retroperitoneal lymph nodes (measuring up to 2.9 cm).  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  There was anasarca/swelling of the left lower extremity.  There was no evidence of metastatic disease in the chest.  Head MRI on 09/13/2017 revealed no evidence of metastatic disease.   He has a left lower extremity DVT.  Bilateral lower extremity duplex on 09/10/2017 revealed evidence of obstruction in the left CFV, SFJ, and proximal femoral vein.  He is on Xarelto.  He is currently day 16 s/p cycle #2 TIP chemotherapy (09/21/2017 - 10/21/2017) with Margarette [place] support. Cycle #1 was administered at [facility]. Cycle #2 was administered  at [facility].  Audiogram on 10/27/2017 revealed hearing within normal limits.  He was diagnosed with cellulitis of the left lower extremity on 10/06/2017.  He was on clindamycin.  While hospitalized (10/08/2017 - 10/16/2017), he received 9 days of vancomycin and Cefepime.  He was discharged on Flagyl, doxycycline, and Levaquin.  He was admitted to [facility] from 10/08/2017 -  10/16/2017 with left lower extremity cellulitis.  He was treated with Cefepime and vancomycin.  Imaging revealed significant improvement in adenopathy with decreased pelvic sidewall disease and decreased bulk at aortic bifurcation.  He was felt to have venous congestion syndrome with skin lesions due to congestion.   He was admitted to [facility] oncology floor from 10/21/2017 - 10/26/2017 for inpatient chemotherapy (cycle #2 TIP).  Was seen in consult by Dr. Tsosie Billing (infectious disease) who felt the patient had been treated adequately with antibiotic coverage and not require follow-up further treatment for his left lower extremity cellulitis.  CBC on the day of discharge revealed a WBC 4700 ([place] 4600).  Hemoglobin 10.0, hematocrit 28.0, MCV 91.4, and platelets 371,000.    He underwent IVC filterplacement, percutaneous angioplastyand stent placementof the left common iliac vein and left external iliac vein on 10/13/2017.  He was discharged on Flagyl, doxycycline, and Levaquin.  Hypercoagulable work-up on 10/12/2017 negative for the following: Factor V Leiden, prothrombin gene mutation, lupus anticoagulant, anticardiolipin antibodies, beta-2 glycoprotein, protein C activity/antigen, protein S activity/antigen. ATIII was 54% (75-120) on heparin.  Symptomatically, he presents to the clinic today not feeling well. He is pale, febrile, and tachycardic. Patient was seen in the ED on 11/04/2017 for the same. Patient recently stepped on a nail with his RIGHT foot. Plain films revealed no evidence of developing osteomyelitis. He was treated with IV clindamycin x 1. Exam reveal edema in patient's BILATERAL lower extremities, with cellulitic changes noted on the LEFT.  WBC 7400 ([place] 6000).  Hemoglobin 7.0, hematocrit 20.5, and platelets 39,000.  Sodium low at 132.  Calcium 8.2 (corrected 8.7).  Magnesium low at 1.3.  Plan: 1. Labs today:  CBC with diff, CMP, Mg, type and screen, blood cultures x2,  lactic acid, procalcitonin, PT, PTT, fibrinogen 2. Recurrent testicular cancer - treatment ongoing  Patient completed cycle #2 TIP chemotherapy with Udencya support on 10/26/2017.  On day 12 s/p cycle #2, patient developed chemotherapy induced neutropenia; WBC 700 (ANC 0).  Currently scheduled to receive cycle #3 TIP chemotherapy as an inpatient beginning on 11/16/2017. Discuss symptom management.  Patient has antiemetics and pain medications at home to use on a PRN basis. Patient  advising that the  prescribed interventions are adequate at this point. Continue all medications as previously prescribed.  3. Cellulitis and developing sepsis - acute recurrence  Patient with evidence of early recurrence of cellulitis to his left lower extremity.  He has swelling and lesions to calf.  Also with right lower extremity swelling.  He stepped on a nail through his right shoe and foot on 11/02/2017.  He was treated with IV clindamycin x 1on 11/04/2017 in the [facility] ED.  Discussed admission with patient for IV antibiotics and further assessment by infectious disease and vascular. Patient in agreement.  Spoke with vascular Delana Meyer, MD) and infectious disease Delaine Lame, MD) to advise of presenting clinical presentation. Patient to be seen in consult during admission.  Spoke with Dr. Lucky Cowboy who is on call over the weekend for vascular surgery.  Patient will need to continue daily wound care using SSD topical + Xeroform + compression wrapping.  Wound care nurse will be consulted during admission for assistance with care.  Additional orders for admission: Blood cultures x2, lactic acid, procalcitonin, PT, PTT, fibrinogen, EKG 4. Lower extremity edema - acute exacerbation   Patient with acute lower extremity edema.  Etiology likely related to developing cellulitis.    Despite patient is on chronic anticoagulation (rivaroxaban), he is at increased risk for coagulopathy and thrombus formation due to oncology  diagnosis and decreased activity.   Will check coagulation studies (r/o DIC) and send patient for ultrasound imaging of his bilateral lower extremities to rule out DVT.   5. Chemotherapy induced anemia - acute exacerbation  Labs reviewed.  Hemoglobin 7.0, hematocrit 20.5, MCV 92.9.  Reticulocytes 2.8%.  Patient received 1 unit of irradiated PRBCs on 11/01/2017.  Discussed need for additional transfusion support today.  Orders placed for 1 unit of PRBCs. 6. Chemotherapy induced thrombocytopenia - acute exacerbation   Platelet count continues to drop following chemotherapy cycle.  Platelet count today is 39,000.  Patient with acute bruising.  Discussed indication for platelet transfusion, including active bleeding.  There is no evidence of bleeding at this time.  We will continue to monitor counts.  Patient will need to HOLD anticoagulation therapy at this time.  7. Electrolyte derangements - acute exacerbation  Sodium low at 132.  Magnesium low at 1.3.  Will need electrolyte replacement during admission.  Potassium is stable at 3.9.  We will continue to monitor. 8. Nutrition - stable  Patient continues to eat well.  No new weight obtained in clinic today.    Albumin stable at 3.4 g/dL  Remains adequately hydrated (CrCl 177.6 mL/min).  9. RTC will be based on hospital course.   Disposition: Spoke to Dr. Clare Charon Salary regarding need for direct admission.  Clinical course reviewed.  Dr. Jerelyn Charles accepted patient for admission to medicine.  Patient admitted to inpatient oncology unit (room 106).  Patient report called to Deirdre, RN cancer center NP.  Inpatient unit aware that patient having additional lab studies and ultrasound of his bilateral lower extremities prior to resenting to their unit for his inpatient stay.  ADDENDUM: Ultrasound imaging of the patient's bilateral lower extremities resulted negative for evidence of lower extremity deep vein thromboses.   Honor Loh, NP   11/05/2017, 12:41 PM   I saw and evaluated the patient, participating in the key portions of the service and reviewing pertinent diagnostic studies and records.  I reviewed the nurse practitioner's note and agree with the findings and the plan.  The assessment and plan were discussed with the patient.  He will require admission to the hospital.  Multiple consultants are involved.  I have spoken with Dr. Delana Meyer, Dr Lucky Cowboy, and Dr. Delaine Lame on several occasions throughout the day.  Honor Loh, NP spoke with Dr. Jerelyn Charles.  A total of (> 40) minutes of face-to-face time was spent with the patient with greater than 50% of that time in counseling and care-coordination.    Nolon Stalls, MD 11/05/2017, 12:41 PM

## 2017-11-05 NOTE — Progress Notes (Signed)
Patient presents today in wheelchair, accompanied by his mother.  States he went to ED last night.  Discharged this morning about 4 AM.  C/o redness, pain, edema in left lower extremity.  Received one dose of clindamycin in ED. DX: cellulitis of left lower leg.  Patient states his temp there was 100.7.  This morning tympanically his temp is 101.1.  Oral 99.9.  HR 125.  BP 108/71. Also got Tetnus in the ED.

## 2017-11-05 NOTE — Consult Note (Signed)
[facility]  Date of admission:  11/05/2017  Inpatient day:  11/05/2017  Consulting physician: Dr. Loney Hering   Reason for Consultation:  Testicular cancer.  Chief Complaint: Edgar [name] is a 27 y.o. male with recurrent testicular cancer who is admitted through the medical oncology clinic with fever and left lower extremity cellulitis and possible sepsis.  HPI:  The patient was seen in the medical oncology clinic for follow-up after cycle #2 chemotherapy (10/21/2017 - 10/26/2017) today.    He stepped on a nail with his right foot on 11/02/2017. He was wearing shoes and the nail reportedly went through his shoe. Nail did not go deep. Foot bled.  He cleaned the wound.  His foot has been swollen since.   He was seen in the ER on 11/04/2017 with fever of 100F. Plain films revealed no osteomyelitis.  He received a tetanus injection.  He received clindamycin IV x 1.  He has noted warmth and swelling to his left lower leg. He admits to not wearing his compression stockings and was on his feet more than usual. He denies cough, congestion, chest pain, shortness of breath, abdominal pain, nausea, vomiting, dysuria or diarrhea.  Symptomatically, he is not feeling well today. He notes that he had been feeling "great' until the night before last. BILATERAL lower extremities are swelling again (L>R).  He had a fever last night (tmax 100.7). Patient has cellulitic changes to the posterior aspect of his LEFT calf. There were early circular lesions with centralized areas of darkening. He also has a few petechiae to BILATERAL lower extremities.   Nausea is controlled. He denies vomiting. Patient has intermittent episodes of diarrhea. Energy is low overall, but notes that he is improving "some". Patient denies shortness of breath.   He presents to the clinic febrile (tmax 101.1) and TACHYcardic to 125. He was pale in color. Patient noted generalized pain rated 7/10 despite  prescribed interventions.   Appetite is good. He is eating "ok". No new weight obtained by nursing staff today in clinic. Patient is taking oral magnesium supplement at home once daily.   Bilateral lower extremity duplex on 11/05/2017 revealed no evidence of DVT.    CT angiogram of the abdomen on 11/05/2017 revealed patent left common and external iliac stents and infrarenal IVC.  There was decreasing left pelvic and retroperitoneal/para-aortic lymphadenopathy and ignificantly decreased left lower extremity edema with some persistent skin thickening in the medial aspect of the left thigh.   Past Medical History:  Diagnosis Date  . Asthma    AS A CHILD-NO INHALERS  . Cancer ([place])    testicular cancer 11/2016 per pt   . DVT (deep venous thrombosis) ([place])   . GERD (gastroesophageal reflux disease)    TUMS PRN    Past Surgical History:  Procedure Laterality Date  . ANKLE FRACTURE SURGERY Right 2004  . ORCHIECTOMY Left 11/26/2015   Procedure: ORCHIECTOMY;  Surgeon: [name], MD;  Location: [facility] ORS;  Service: Urology;  Laterality: Left;  radical/Inguinal approach  . PERIPHERAL VASCULAR CATHETERIZATION N/A 12/16/2015   Procedure: [name] Cath Insertion;  Surgeon: [name], MD;  Location: [facility];  Service: Cardiovascular;  Laterality: N/A;  . PERIPHERAL VASCULAR THROMBECTOMY Left 10/13/2017   Procedure: PERIPHERAL VASCULAR THROMBECTOMY;  Surgeon: [name], MD;  Location: [facility];  Service: Cardiovascular;  Laterality: Left;  . WISDOM TOOTH EXTRACTION  2017    Family History  Problem Relation Age of Onset  .  Skin cancer Mother   . Breast cancer Maternal Grandmother   . Colon cancer Maternal Grandfather   . Diabetes Maternal Grandfather   . Emphysema Father   . Mental illness Sister        anxiety  . Stroke Paternal Grandmother   . Prostate cancer Neg Hx   . Kidney cancer Neg Hx   . Bladder Cancer Neg Hx     Social History:  reports that  he has been smoking cigarettes. He has a 4.00 pack-year smoking history. He has quit using smokeless tobacco.  His smokeless tobacco use included snuff. He reports that he has current or past drug history. Drug: Marijuana. He reports that he does not drink alcohol.  The patient is accompanied in clinic by his mother.  He is accompanied by his girlfriend and daughter in the hospital.  Allergies: No Known Allergies  Medications Prior to Admission  Medication Sig Dispense Refill  . acetaminophen (TYLENOL) 500 MG tablet Take 500-1,000 mg by mouth every 6 (six) hours as needed for mild pain or fever.     Marland Kitchen dexamethasone (DECADRON) 4 MG tablet Take 4 mg by mouth daily.    . DULoxetine (CYMBALTA) 60 MG capsule Take 1 tablet by mouth daily.    Marland Kitchen gabapentin (NEURONTIN) 300 MG capsule Take 1 capsule (300 mg total) by mouth at bedtime. 30 capsule 0  . loratadine (CLARITIN) 10 MG tablet Take 1 tablet (10 mg total) by mouth daily. 90 tablet 1  . magnesium oxide (MAG-OX) 400 MG tablet Take 1 tablet by mouth daily.    . nicotine (NICODERM CQ - DOSED IN MG/24 HR) 7 mg/24hr patch Place 7 mg onto the skin daily.     . ondansetron (ZOFRAN-ODT) 4 MG disintegrating tablet Take 2 tablets by mouth every 8 (eight) hours as needed.    Marland Kitchen oxyCODONE (OXY IR/ROXICODONE) 5 MG immediate release tablet Take 1 tablet (5 mg total) by mouth every 8 (eight) hours as needed for severe pain or breakthrough pain. 30 tablet 0  . oxyCODONE (OXYCONTIN) 10 mg 12 hr tablet Take 1 tablet (10 mg total) by mouth every 12 (twelve) hours. 30 tablet 0  . polyethylene glycol (MIRALAX / GLYCOLAX) packet Take 17 g by mouth daily as needed for mild constipation. 14 each 0  . potassium chloride (KLOR-CON) 20 MEQ packet Take 20 mEq by mouth 2 (two) times daily.     . prochlorperazine (COMPAZINE) 10 MG tablet Take 1 tablet by mouth every 6 (six) hours as needed (nausea/vomiting).     . silver sulfADIAZINE (SILVADENE) 1 % cream Apply topically 2 (two)  times daily. Apply to blisters on left thigh and cover with nonadherent dressing 1000 g 2  . XARELTO 20 MG TABS tablet Take 1 tablet by mouth daily.      Review of Systems: GENERAL:  Fatigue.  Fever (Tmax 101.1).  Feels cold/chills.  No sweats.  No weight loww. PERFORMANCE STATUS (ECOG):  2 HEENT:  Tinnitus.  Dysgeusia.  No visual changes, runny nose, sore throat, mouth sores or tenderness. Lungs: Shortness of breath with exertion.  No cough.  No hemoptysis. Cardiac:  Tachycardia.  No chest pain, palpitations, orthopnea, or PND. GI:  Nausea, controlled.  No vomiting, diarrhea, constipation, melena or hematochezia. GU:  No urgency, frequency, dysuria, or hematuria. Musculoskeletal:  Bone pain after [place].  Achy.  No back pain.  No joint pain.  No muscle tenderness. Extremities:  New bilateral below the knee edema. Skin:  Increasing redness  to left lower extremity.  New circular lesions left calf.  Left thigh lesions healing.  Puncture wound right foot.  Neuro:  Generalized weakness.  Minimal neuropathy post chemotherapy.  No headache, balance or coordination issues. Endocrine:  No diabetes, thyroid issues, hot flashes or night sweats. Psych:  No mood changes, depression or anxiety. Pain:  No focal pain. Review of systems:  All other systems reviewed and found to be negative.  Physical Exam:  Blood pressure 101/63, pulse (!) 110, temperature (!) 101.8 F (38.8 C), temperature source Oral, resp. rate (!) 0, height 6\' 3"  (1.905 m), weight 280 lb 3.2 oz (127.1 kg), SpO2 92 %.  GENERAL:  Fatigued appearing gentleman sitting comfortably on the medical unit in no acute distress. MENTAL STATUS:  Alert and oriented to person, place and time. HEAD:  Alopecia.  Normocephalic, atraumatic, face symmetric, no Cushingoid features. EYES:  Brown eyes.  Pupils equal round and reactive to light and accomodation.  No conjunctivitis or scleral icterus. ENT:  Oropharynx clear without lesion.  Tongue normal.  Mucous membranes moist.  RESPIRATORY:  Clear to auscultation without rales, wheezes or rhonchi. CARDIOVASCULAR:  Regular rate and rhythm without murmur, rub or gallop. ABDOMEN:  Soft, non-tender, with active bowel sounds, and no hepatosplenomegaly.  No masses. SKIN:  Pale.  Few lower extemity petechiae.  Well healing large circular lesions left thigh.  Left distal lower extremity with erythema and new circular lesion with a hint of cental darkening.  Puncture wound right foot without erythema or induration. EXTREMITIES: Left upper thigh unwrapped.  Bilateral below the knee edema (new). No palpable cords. LYMPH NODES: No palpable cervical, supraclavicular, axillary or inguinal adenopathy  NEUROLOGICAL: Unremarkable. PSYCH:  Appropriate.   Results for orders placed or performed during the hospital encounter of 11/05/17 (from the past 48 hour(s))  Iron and TIBC     Status: Abnormal   Collection Time: 11/05/17  3:10 PM  Result Value Ref Range   Iron 13 (L) 45 - 182 ug/dL   TIBC 229 (L) 250 - 450 ug/dL   Saturation Ratios 6 (L) 17.9 - 39.5 %   UIBC 216 ug/dL    Comment: Performed at [facility], [address] [place], [place] [address]  Reticulocytes     Status: Abnormal   Collection Time: 11/05/17  3:10 PM  Result Value Ref Range   Retic Ct Pct 2.8 0.4 - 3.1 %   RBC. 2.17 (L) 4.40 - 5.90 MIL/uL   Retic Count, Absolute 60.8 19.0 - 183.0 K/uL    Comment: Performed at [facility], [address] [place], [place] [address]  TSH     Status: None   Collection Time: 11/05/17  3:10 PM  Result Value Ref Range   TSH 0.554 0.350 - 4.500 uIU/mL    Comment: Performed by a 3rd Generation assay with a functional sensitivity of <=0.01 uIU/mL. Performed at [facility], [address] [place], [place] [address]   Ferritin     Status: Abnormal   Collection Time: 11/05/17  3:10 PM  Result Value Ref Range   Ferritin 414 (H) 24 - 336 ng/mL    Comment: Performed at  [facility], [address] [place], [place] [address]  Folate     Status: None   Collection Time: 11/05/17  3:10 PM  Result Value Ref Range   Folate 12.6 >5.9 ng/mL    Comment: Performed at [facility], [address] [place], [place] [address]  Dg Chest 2 View  Result Date: 11/04/2017 CLINICAL DATA:  Fever, cancer patient EXAM: CHEST - 2 VIEW COMPARISON:  CT 09/30/2016, radiograph 10/06/2017 FINDINGS: Right-sided central venous port tip over the proximal right atrium. Low lung volumes. No focal opacity or pleural effusion. Stable cardiomediastinal silhouette. No pneumothorax. Partially visualized IVC filter. IMPRESSION: No active cardiopulmonary disease. Electronically Signed   By: Donavan Foil M.D.   On: 11/04/2017 23:43   Ct Angio Ao+bifem W & Or Wo Contrast  Result Date: 11/05/2017 CLINICAL DATA:  27 year old male with sudden onset of a cold, painful left lower extremity. Past medical history of colon cancer and left lower extremity DVT. EXAM: CT ANGIOGRAPHY OF ABDOMINAL AORTA WITH ILIOFEMORAL RUNOFF TECHNIQUE: Multidetector CT imaging of the abdomen, pelvis and lower extremities was performed using the standard protocol during bolus administration of intravenous contrast. Multiplanar CT image reconstructions and MIPs were obtained to evaluate the vascular anatomy. CONTRAST:  13mL ISOVUE-370 IOPAMIDOL (ISOVUE-370) INJECTION 76% COMPARISON:  Prior CT scan of the abdomen and pelvis 10/10/2017 FINDINGS: VASCULAR Aorta: Normal caliber aorta without aneurysm, dissection, vasculitis or significant stenosis. Celiac: Patent without evidence of aneurysm, dissection, vasculitis or significant stenosis. SMA: Patent without evidence of aneurysm, dissection, vasculitis or significant stenosis. Renals: Both renal arteries are patent without evidence of aneurysm, dissection, vasculitis, fibromuscular dysplasia or significant stenosis. IMA: Patent without evidence of aneurysm,  dissection, vasculitis or significant stenosis. RIGHT Lower Extremity Inflow: Common, internal and external iliac arteries are patent without evidence of aneurysm, dissection, vasculitis or significant stenosis. Outflow: Common, superficial and profunda femoral arteries and the popliteal artery are patent without evidence of aneurysm, dissection, vasculitis or significant stenosis. Runoff: Patent three vessel runoff to the ankle. LEFT Lower Extremity Inflow: Common, internal and external iliac arteries are patent without evidence of aneurysm, dissection, vasculitis or significant stenosis. Outflow: Common, superficial and profunda femoral arteries and the popliteal artery are patent without evidence of aneurysm, dissection, vasculitis or significant stenosis. Runoff: Patent three vessel runoff to the ankle. Veins: Infrarenal IVC filter in place. No evidence of complication. The IVC and pelvic veins are all patent including the left external and common iliac venous stent. No evidence of left lower extremity DVT. Review of the MIP images confirms the above findings. NON-VASCULAR Lower chest: No acute abnormality. Hepatobiliary: No focal liver abnormality is seen. No gallstones, gallbladder wall thickening, or biliary dilatation. Pancreas: Unremarkable. No pancreatic ductal dilatation or surrounding inflammatory changes. Spleen: Normal in size without focal abnormality. Adrenals/Urinary Tract: Adrenal glands are unremarkable. Kidneys are normal, without renal calculi, focal lesion, or hydronephrosis. Bladder is unremarkable. Stomach/Bowel: Stomach is within normal limits. Appendix appears normal. No evidence of bowel wall thickening, distention, or inflammatory changes. Lymphatic: Stable right para-aortic lymph node measuring approximately 9 x 9 mm (image 84 series 4); left para-aortic lymph node measures slightly smaller at 8 mm compared to 12 mm previously. Left para-aortic lymph node more inferiorly is also  decreasing in size presently at 15 mm compared to 17 mm previously. Left common iliac station node measures approximately 2.5 x 1.6 cm compared to 3.3 x 1.6 cm. Left external iliac station node measures approximately 3.3 x 2.6 cm compared to 4.0 x 3.7 cm. Reproductive: Surgical changes suggest left orchiectomy. Persistent postoperative changes present in the region the left inguinal canal. Other: No abdominal wall hernia or abnormality. No abdominopelvic ascites. Musculoskeletal: No acute or significant osseous findings. Significantly decreased left lower extremity swelling. There is some persistent skin thickening along the medial aspect of the  left upper thigh. IMPRESSION: VASCULAR 1. No acute arterial or venous abnormality. 2. Patent left common and external iliac venous stents. 3. Well-positioned potentially retrievable infrarenal IVC filter. NON-VASCULAR 1. Interval response to therapy with decreasing left pelvic and retroperitoneal/para-aortic lymphadenopathy. 2. Postoperative changes in the left inguinal and left groin region as expected. 3. Significantly decreased left lower extremity edema. 4. There is some persistent skin thickening in the medial aspect of the left thigh in the region of the reported wounds. Electronically Signed   By: Jacqulynn Cadet M.D.   On: 11/05/2017 16:07   US Venous Img Lower Bilateral  Result Date: 11/05/2017 CLINICAL DATA:  Bilateral lower extremity edema EXAM: BILATERAL LOWER EXTREMITY VENOUS DOPPLER ULTRASOUND TECHNIQUE: Gray-scale sonography with compression, as well as color and duplex ultrasound, were performed to evaluate the deep venous system from the level of the common femoral vein through the popliteal and proximal calf veins. COMPARISON:  10/09/2017 FINDINGS: Normal compressibility of the common femoral, superficial femoral, and popliteal veins, as well as the proximal calf veins. No filling defects to suggest DVT on grayscale or color Doppler imaging. Doppler  waveforms show normal direction of venous flow, normal respiratory phasicity and response to augmentation. IMPRESSION: No evidence of  lower extremity deep vein thrombosis. Electronically Signed   By: Lucrezia Europe M.D.   On: 11/05/2017 13:03   Dg Foot Complete Right  Result Date: 11/05/2017 CLINICAL DATA:  Stepped on a nail 3 days ago. Evaluate for osteomyelitis. EXAM: RIGHT FOOT COMPLETE - 3+ VIEW COMPARISON:  None. FINDINGS: Faint radiopaque densities about the skin and soft tissues subjacent to the first-second interspace may be site of puncture wound. No soft tissue air. No fracture or dislocation. No osseous erosions or bony destructive change. No periosteal reaction. Diffuse soft tissue edema. Screws in the distal tibia partially included. IMPRESSION: Scattered faint radiopaque densities about the skin and soft tissues subjacent to the first-interspace may be site of puncture wound. No radiographic findings of osteomyelitis. Electronically Signed   By: Jeb Levering M.D.   On: 11/05/2017 01:04    Assessment:  The patient is a 27 y.o. gentleman with recurrent testicular cancer who is currently day 16 s/p cycle #2 TIP chemotherapy.  He presented with fever, chills, and tachycardia.  Exam revealed left lower extremity cellulitis.  He recently stepped on a nail through a shoe on his right foot.  He was admitted to [facility] from 10/08/2017 - 10/16/2017 with apparent left lower extremity cellulitis. He was treated with Cefepime and vancomycin. He was felt tohave venous congestion syndrome with skin lesions due to congestion.Skin and edema rapidly improved after vascular procedure.  He underwent IVC filterplacement, percutaneous angioplastyand stent placementof the left common iliac vein and left external iliac vein on 10/13/2017.  Bilateral lower extremity duplex on 11/05/2017 revealed no evidence of DVT.    CT angiogram of the abdomen on 11/05/2017 revealed patent left common and external  iliac stents and infrarenal IVC.  There was decreasing left pelvic and retroperitoneal/para-aortic lymphadenopathy and ignificantly decreased left lower extremity edema with some persistent skin thickening in the medial aspect of the left thigh.  Symptomatically, he is fatigued.  He was febrile and tachycardic in clinic.  Exam reveals bilateral new lower extremity edema from the knees down bilaterally, increasing erythema in the left lower leg, new oval skin lesions left lower leg similar to his admission on 10/08/2017.  Plan:  1.  Oncology:  Recurrent testicular cancer day 16 s/p cycle #2 TIP chemotherapy.  CT scans today reveal responding disease.  Plan to admit for cycle #3 on 11/16/2017.  2.  Hematology:  Patient's neutropenia post cycle #2 chemotherapy has resolved.  ANC was 0 on 11/01/2017.  Patient has chemotherapy induced anemia and thrombocytopenia.  Platelet count likely lower secondary to consumption associated with infection.  No evidence of DIC.   Patient has been on Xarelto as an outpatient for LLE DVT and stent placement.  Last dose taken by patient last night. Xarelto was to be held secondary to thrombocytopenia.  Patient received dose tonight.  Xarelto discontinued.  Follow CBC daily.  If any bleeding, platelet transfusion and call hematology on call.   Anticipate initiation of heparin after platelet count > 50,000.   Chemotherapy induced anemia.  Hemoglobin 7.0.  Plan 1 unit of PRBCs.  3.  Infectious disease:  Clinically patient has a LLE cellulitis with fever, chills, tachycardia and left lower extremity erythema.  Agree with broad spectrum antibiotics (Cefepime and clindamycin).  Patient also stepped on a nail through his shoe on his right foot.  No obvious infection in right foot, but new lower extremity edema.  Right foot prophylaxis being covered with broad spectrum antibiotics.     Follow cultures obtained today.  Blood culture q 24 hours prn temp > 100.4.  Appreciate ID  consult.  4.  Vascular:  Patient s/p stent on 10/13/2017.  CT scan reveals patent stent and no thrombosis.  Bilaeral lower extremity duplex reveals no thrombosis.  Etiology of edema likely secondary to inflammation/vascular leak and low oncotic pressure (anemia and low albumen), Patient to keep legs elevated.  Appreciate vascular consult.  5.  Dermatology:  Skin lesions in left lower extremity felt to be secondary to venous congestion syndrome.  Concern raised by ID that lesions may represent drug eruption, erythema multiforme, ecthyma gangrenosum, or nodular vasculitis.   Discussed with Dr. Delana Meyer.  Patient to undergo skin punch biopsy next week when platelet count > 70,000.  6.  Fluids/Electroytes/Nutrition:  Patient is s/p cisplatin with electrolytes (potassium and magnesium) wasting.  Patient on supplemental potassium and magnesium at home.  IV magnesium today.  Follow electrolytes daily.  7.  Pain and Toxicology:  Patient has pain associated with his legs.  He takes Oxycontin 10 mg po q 12 hours and oxycodone 5 mg po q 8 hors prn pain.   Thank you for allowing me to participate in BERTIN INABINET 's care.  I will follow him closely with you while hospitalized and after discharge in the outpatient department.  Dr. Tasia Catchings is on call this weekend.   [name], MD  11/05/2017, 9:11 PM

## 2017-11-05 NOTE — Progress Notes (Signed)
Patient seen and examined full consult to follow.  Mr. Raborn is well-known to me.  He has been experiencing increasing pain in his left posterior calf which was a distinct change over the past week or so.  He has not developed a bluish central area surrounded by an erythematous ring which is very tender.  This he describes the way all the other blistered lesions appeared before.           I have secured punch biopsies and when his platelet count is improved we will plan for biopsy of this lesion likely next week Monday or Tuesday.  He has developed both thrombocytopenia as well as anemia.               He does have an IVC filter and although stopping all anticoagulation does put his stents at risk for thrombosis he is also now anemic and likely a short-term cessation of anticoagulation is appropriate for discontinuing his Xarelto and using a low-dose heparin until his hematologic parameters are improved.  He is developed significant swelling bilaterally.            his DVT scan is negative.  At this point he is he appears to have more of an inflammatory process perhaps aggravated by his hypoalbuminemia.  For the time being I would recommend elevating the foot of his bed continue to follow as we complete our investigation of the skin lesion and improve his platelet count and red count I believe he can more aggressively treat his edema.  At this point time I would not perform CT venography.

## 2017-11-06 ENCOUNTER — Encounter: Payer: Self-pay | Admitting: Hematology and Oncology

## 2017-11-06 DIAGNOSIS — R197 Diarrhea, unspecified: Secondary | ICD-10-CM

## 2017-11-06 DIAGNOSIS — I82409 Acute embolism and thrombosis of unspecified deep veins of unspecified lower extremity: Secondary | ICD-10-CM

## 2017-11-06 DIAGNOSIS — M7989 Other specified soft tissue disorders: Secondary | ICD-10-CM

## 2017-11-06 DIAGNOSIS — D696 Thrombocytopenia, unspecified: Secondary | ICD-10-CM | POA: Insufficient documentation

## 2017-11-06 DIAGNOSIS — C6212 Malignant neoplasm of descended left testis: Secondary | ICD-10-CM

## 2017-11-06 DIAGNOSIS — D6481 Anemia due to antineoplastic chemotherapy: Secondary | ICD-10-CM

## 2017-11-06 DIAGNOSIS — R600 Localized edema: Secondary | ICD-10-CM

## 2017-11-06 DIAGNOSIS — I824Y2 Acute embolism and thrombosis of unspecified deep veins of left proximal lower extremity: Secondary | ICD-10-CM

## 2017-11-06 DIAGNOSIS — Z7901 Long term (current) use of anticoagulants: Secondary | ICD-10-CM

## 2017-11-06 DIAGNOSIS — T451X5A Adverse effect of antineoplastic and immunosuppressive drugs, initial encounter: Secondary | ICD-10-CM

## 2017-11-06 DIAGNOSIS — R51 Headache: Secondary | ICD-10-CM

## 2017-11-06 DIAGNOSIS — R609 Edema, unspecified: Secondary | ICD-10-CM

## 2017-11-06 DIAGNOSIS — F17210 Nicotine dependence, cigarettes, uncomplicated: Secondary | ICD-10-CM

## 2017-11-06 LAB — BASIC METABOLIC PANEL
Anion gap: 5 (ref 5–15)
BUN: 11 mg/dL (ref 6–20)
CO2: 28 mmol/L (ref 22–32)
Calcium: 7.9 mg/dL — ABNORMAL LOW (ref 8.9–10.3)
Chloride: 104 mmol/L (ref 98–111)
Creatinine, Ser: 0.8 mg/dL (ref 0.61–1.24)
GFR calc Af Amer: >60 mL/min (ref >60)
GFR calc non Af Amer: >60 mL/min (ref >60)
Glucose, Bld: 129 mg/dL — ABNORMAL HIGH (ref 70–99)
Potassium: 3.1 mmol/L — ABNORMAL LOW (ref 3.5–5.1)
Sodium: 137 mmol/L (ref 135–145)

## 2017-11-06 LAB — CBC WITH DIFFERENTIAL/PLATELET
Basophils Absolute: 0 10*3/uL (ref 0–0.1)
Basophils Relative: 0 %
Eosinophils Absolute: 0 10*3/uL (ref 0–0.7)
Eosinophils Relative: 0 %
HCT: 20.4 % — ABNORMAL LOW (ref 40.0–52.0)
Hemoglobin: 7 g/dL — ABNORMAL LOW (ref 13.0–18.0)
Lymphocytes Relative: 2 %
Lymphs Abs: 0.2 10*3/uL — ABNORMAL LOW (ref 1.0–3.6)
MCH: 31.3 pg (ref 26.0–34.0)
MCHC: 34.2 g/dL (ref 32.0–36.0)
MCV: 91.6 fL (ref 80.0–100.0)
Monocytes Absolute: 0.4 10*3/uL (ref 0.2–1.0)
Monocytes Relative: 5 %
Neutro Abs: 8.7 10*3/uL — ABNORMAL HIGH (ref 1.4–6.5)
Neutrophils Relative %: 93 %
Platelets: 47 10*3/uL — ABNORMAL LOW (ref 150–440)
RBC: 2.23 MIL/uL — ABNORMAL LOW (ref 4.40–5.90)
RDW: 15.2 % — ABNORMAL HIGH (ref 11.5–14.5)
WBC: 9.3 10*3/uL (ref 3.8–10.6)

## 2017-11-06 LAB — BPAM RBC
Blood Product Expiration Date: 201908282359
ISSUE DATE / TIME: 201908232347
Unit Type and Rh: 9500

## 2017-11-06 LAB — TYPE AND SCREEN
ABO/RH(D): A POS
ANTIBODY SCREEN: NEGATIVE
Unit division: 0

## 2017-11-06 LAB — PROTIME-INR
INR: 1.65
PROTHROMBIN TIME: 19.4 s — AB (ref 11.4–15.2)

## 2017-11-06 LAB — URINE CULTURE: CULTURE: NO GROWTH

## 2017-11-06 LAB — CORTISOL-AM, BLOOD: CORTISOL - AM: 20.2 ug/dL (ref 6.7–22.6)

## 2017-11-06 LAB — PROCALCITONIN: PROCALCITONIN: 0.43 ng/mL

## 2017-11-06 LAB — MAGNESIUM: Magnesium: 1.6 mg/dL — ABNORMAL LOW (ref 1.7–2.4)

## 2017-11-06 MED ORDER — OXYCODONE HCL 5 MG PO TABS: 10.0000 mg | ORAL_TABLET | Freq: Three times a day (TID) | ORAL | Status: DC | PRN

## 2017-11-06 MED ORDER — OXYCODONE HCL 5 MG PO TABS
10.0000 mg | ORAL_TABLET | Freq: Three times a day (TID) | ORAL | Status: DC | PRN
  Administered 2017-11-06 – 2017-11-09 (×8): 10 mg via ORAL
  Filled 2017-11-06 (×8): qty 2

## 2017-11-06 NOTE — Progress Notes (Signed)
[facility]  Daily Progress Note   Subjective  - * No surgery found *  Leg remains pretty swollen and painful.  Lamonte Sakai been walking some.  Elevating his legs much of the time.  Wrap has been on left leg Mild right leg swelling and two new lesions on the right thigh now.  These are painful as well Platelet count up a bit.  So far no signs of bleeding  Objective Vitals:   11/06/17 0327 11/06/17 0425 11/06/17 0426 11/06/17 0900  BP: 126/70 (!) 107/57 (!) 102/58 108/60  Pulse: (!) 121 (!) 124 (!) 123 (!) 120  Resp: 20 13  16   Temp: 99.7 F (37.6 C) 99.8 F (37.7 C)  99.3 F (37.4 C)  TempSrc: Oral Oral  Oral  SpO2: 95% 93%  94%  Weight:      Height:        Intake/Output Summary (Last 24 hours) at 11/06/2017 1427 Last data filed at 11/06/2017 0500 Gross per 24 hour  Intake 3486.94 ml  Output 1200 ml  Net 2286.94 ml    PULM  Respirations not labored, no use of accessory muscles CV  tachycardic VASC  1-2+ LLE swelling. Dark discolored skin lesion on posterior left calf, medial and anterior right thigh  Laboratory CBC    Component Value Date/Time   WBC 9.3 11/06/2017 0531   HGB 7.0 (L) 11/06/2017 0531   HCT 20.4 (L) 11/06/2017 0531   PLT 47 (L) 11/06/2017 0531    BMET    Component Value Date/Time   NA 137 11/06/2017 0531   K 3.1 (L) 11/06/2017 0531   CL 104 11/06/2017 0531   CO2 28 11/06/2017 0531   GLUCOSE 129 (H) 11/06/2017 0531   BUN 11 11/06/2017 0531   CREATININE 0.80 11/06/2017 0531   CALCIUM 7.9 (L) 11/06/2017 0531   GFRNONAA >60 11/06/2017 0531   GFRAA >60 11/06/2017 0531    Assessment/Planning: POD #25 s/p LLE venous intervention for DVT including IVC filter and thrombectomy and thrombolysis   Platelet count is a little better today at 47K. Can consider starting heparin when counts of >50-60K.  Has a filter in place and no current DVT with patent iliac vein stent on CT scan.  OK to be off anticoagulation for a few days if  needed  Skin lesions of unclear etiology. Now on both legs even though right leg has never had DVT. Dr. Delana Meyer planning biopsy early this week, potentially Tuesday if platelet counts continue to improve  Oncology following for testicular cancer  Leg swelling.  Continue elevation and compression.  No DVT seen on duplex.     Leotis Pain  11/06/2017, 2:27 PM

## 2017-11-06 NOTE — Progress Notes (Signed)
[facility] NAME: Maicol Bowland    MR#:  [id number]  DATE OF BIRTH:  Jan 31, 1991  SUBJECTIVE:  CHIEF COMPLAINT:  No chief complaint on file.  Continues to have pain left thigh.  Afebrile today.  Ambulating with difficulty.  REVIEW OF SYSTEMS:    Review of Systems  Constitutional: Positive for malaise/fatigue. Negative for chills and fever.  HENT: Negative for sore throat.   Eyes: Negative for blurred vision, double vision and pain.  Respiratory: Negative for cough, hemoptysis, shortness of breath and wheezing.   Cardiovascular: Negative for chest pain, palpitations, orthopnea and leg swelling.  Gastrointestinal: Negative for abdominal pain, constipation, diarrhea, heartburn, nausea and vomiting.  Genitourinary: Negative for dysuria and hematuria.  Musculoskeletal: Negative for back pain and joint pain.  Skin: Negative for rash.  Neurological: Negative for sensory change, speech change, focal weakness and headaches.  Endo/Heme/Allergies: Does not bruise/bleed easily.  Psychiatric/Behavioral: Negative for depression. The patient is not nervous/anxious.     DRUG ALLERGIES:  No Known Allergies  VITALS:  Blood pressure 108/60, pulse (!) 120, temperature 99.3 F (37.4 C), temperature source Oral, resp. rate 16, height 6\' 3"  (1.905 m), weight 127.1 kg, SpO2 94 %.  PHYSICAL EXAMINATION:   Physical Exam  GENERAL:  27 y.o.-year-old patient lying in the bed with no acute distress.  EYES: Pupils equal, round, reactive to light and accommodation. No scleral icterus. Extraocular muscles intact.  HEENT: Head atraumatic, normocephalic. Oropharynx and nasopharynx clear.  NECK:  Supple, no jugular venous distention. No thyroid enlargement, no tenderness.  LUNGS: Normal breath sounds bilaterally, no wheezing, rales, rhonchi. No use of accessory muscles of respiration.  CARDIOVASCULAR: S1, S2 normal. No murmurs, rubs, or gallops.  ABDOMEN:  Soft, nontender, nondistended. Bowel sounds present. No organomegaly or mass.  EXTREMITIES: No cyanosis, clubbing or edema b/l.    NEUROLOGIC: Cranial nerves II through XII are intact. No focal Motor or sensory deficits b/l.   PSYCHIATRIC: The patient is alert and oriented x 3.  SKIN: Left thigh large healing ulcers with no discharge. Left calf lesions.  LABORATORY PANEL:   CBC Recent Labs  Lab 11/06/17 0531  WBC 9.3  HGB 7.0*  HCT 20.4*  PLT 47*   ------------------------------------------------------------------------------------------------------------------ Chemistries  Recent Labs  Lab 11/05/17 0943 11/06/17 0531  NA 132* 137  K 3.9 3.1*  CL 101 104  CO2 24 28  GLUCOSE 107* 129*  BUN 14 11  CREATININE 0.85 0.80  CALCIUM 8.2* 7.9*  MG 1.3* 1.6*  AST 14*  --   ALT 10  --   ALKPHOS 51  --   BILITOT 0.5  --    ------------------------------------------------------------------------------------------------------------------  Cardiac Enzymes No results for input(s): TROPONINI in the last 168 hours. ------------------------------------------------------------------------------------------------------------------  RADIOLOGY:  Dg Chest 2 View  Result Date: 11/04/2017 CLINICAL DATA:  Fever, cancer patient EXAM: CHEST - 2 VIEW COMPARISON:  CT 09/30/2016, radiograph 10/06/2017 FINDINGS: Right-sided central venous port tip over the proximal right atrium. Low lung volumes. No focal opacity or pleural effusion. Stable cardiomediastinal silhouette. No pneumothorax. Partially visualized IVC filter. IMPRESSION: No active cardiopulmonary disease. Electronically Signed   By: Donavan Foil M.D.   On: 11/04/2017 23:43   Ct Angio Ao+bifem W & Or Wo Contrast  Result Date: 11/05/2017 CLINICAL DATA:  27 year old male with sudden onset of a cold, painful left lower extremity. Past medical history of colon cancer and left lower extremity DVT. EXAM: CT ANGIOGRAPHY OF ABDOMINAL AORTA WITH  ILIOFEMORAL RUNOFF TECHNIQUE: Multidetector CT imaging of the abdomen, pelvis and lower extremities was performed using the standard protocol during bolus administration of intravenous contrast. Multiplanar CT image reconstructions and MIPs were obtained to evaluate the vascular anatomy. CONTRAST:  191mL ISOVUE-370 IOPAMIDOL (ISOVUE-370) INJECTION 76% COMPARISON:  Prior CT scan of the abdomen and pelvis 10/10/2017 FINDINGS: VASCULAR Aorta: Normal caliber aorta without aneurysm, dissection, vasculitis or significant stenosis. Celiac: Patent without evidence of aneurysm, dissection, vasculitis or significant stenosis. SMA: Patent without evidence of aneurysm, dissection, vasculitis or significant stenosis. Renals: Both renal arteries are patent without evidence of aneurysm, dissection, vasculitis, fibromuscular dysplasia or significant stenosis. IMA: Patent without evidence of aneurysm, dissection, vasculitis or significant stenosis. RIGHT Lower Extremity Inflow: Common, internal and external iliac arteries are patent without evidence of aneurysm, dissection, vasculitis or significant stenosis. Outflow: Common, superficial and profunda femoral arteries and the popliteal artery are patent without evidence of aneurysm, dissection, vasculitis or significant stenosis. Runoff: Patent three vessel runoff to the ankle. LEFT Lower Extremity Inflow: Common, internal and external iliac arteries are patent without evidence of aneurysm, dissection, vasculitis or significant stenosis. Outflow: Common, superficial and profunda femoral arteries and the popliteal artery are patent without evidence of aneurysm, dissection, vasculitis or significant stenosis. Runoff: Patent three vessel runoff to the ankle. Veins: Infrarenal IVC filter in place. No evidence of complication. The IVC and pelvic veins are all patent including the left external and common iliac venous stent. No evidence of left lower extremity DVT. Review of the MIP images  confirms the above findings. NON-VASCULAR Lower chest: No acute abnormality. Hepatobiliary: No focal liver abnormality is seen. No gallstones, gallbladder wall thickening, or biliary dilatation. Pancreas: Unremarkable. No pancreatic ductal dilatation or surrounding inflammatory changes. Spleen: Normal in size without focal abnormality. Adrenals/Urinary Tract: Adrenal glands are unremarkable. Kidneys are normal, without renal calculi, focal lesion, or hydronephrosis. Bladder is unremarkable. Stomach/Bowel: Stomach is within normal limits. Appendix appears normal. No evidence of bowel wall thickening, distention, or inflammatory changes. Lymphatic: Stable right para-aortic lymph node measuring approximately 9 x 9 mm (image 84 series 4); left para-aortic lymph node measures slightly smaller at 8 mm compared to 12 mm previously. Left para-aortic lymph node more inferiorly is also decreasing in size presently at 15 mm compared to 17 mm previously. Left common iliac station node measures approximately 2.5 x 1.6 cm compared to 3.3 x 1.6 cm. Left external iliac station node measures approximately 3.3 x 2.6 cm compared to 4.0 x 3.7 cm. Reproductive: Surgical changes suggest left orchiectomy. Persistent postoperative changes present in the region the left inguinal canal. Other: No abdominal wall hernia or abnormality. No abdominopelvic ascites. Musculoskeletal: No acute or significant osseous findings. Significantly decreased left lower extremity swelling. There is some persistent skin thickening along the medial aspect of the left upper thigh. IMPRESSION: VASCULAR 1. No acute arterial or venous abnormality. 2. Patent left common and external iliac venous stents. 3. Well-positioned potentially retrievable infrarenal IVC filter. NON-VASCULAR 1. Interval response to therapy with decreasing left pelvic and retroperitoneal/para-aortic lymphadenopathy. 2. Postoperative changes in the left inguinal and left groin region as  expected. 3. Significantly decreased left lower extremity edema. 4. There is some persistent skin thickening in the medial aspect of the left thigh in the region of the reported wounds. Electronically Signed   By: Jacqulynn Cadet M.D.   On: 11/05/2017 16:07   US Venous Img Lower Bilateral  Result Date: 11/05/2017 CLINICAL DATA:  Bilateral lower extremity edema EXAM: BILATERAL LOWER EXTREMITY VENOUS  DOPPLER ULTRASOUND TECHNIQUE: Gray-scale sonography with compression, as well as color and duplex ultrasound, were performed to evaluate the deep venous system from the level of the common femoral vein through the popliteal and proximal calf veins. COMPARISON:  10/09/2017 FINDINGS: Normal compressibility of the common femoral, superficial femoral, and popliteal veins, as well as the proximal calf veins. No filling defects to suggest DVT on grayscale or color Doppler imaging. Doppler waveforms show normal direction of venous flow, normal respiratory phasicity and response to augmentation. IMPRESSION: No evidence of  lower extremity deep vein thrombosis. Electronically Signed   By: Lucrezia Europe M.D.   On: 11/05/2017 13:03   Dg Foot Complete Right  Result Date: 11/05/2017 CLINICAL DATA:  Stepped on a nail 3 days ago. Evaluate for osteomyelitis. EXAM: RIGHT FOOT COMPLETE - 3+ VIEW COMPARISON:  None. FINDINGS: Faint radiopaque densities about the skin and soft tissues subjacent to the first-second interspace may be site of puncture wound. No soft tissue air. No fracture or dislocation. No osseous erosions or bony destructive change. No periosteal reaction. Diffuse soft tissue edema. Screws in the distal tibia partially included. IMPRESSION: Scattered faint radiopaque densities about the skin and soft tissues subjacent to the first-interspace may be site of puncture wound. No radiographic findings of osteomyelitis. Electronically Signed   By: Jeb Levering M.D.   On: 11/05/2017 01:04     ASSESSMENT AND PLAN:    *  Left lower extremity cellulitis On IV antibiotics.  Appreciate ID input.  Cultures pending.  * Testicular cancer on chemotherapy Follows with Dr. Mike Gip  *Chronic left thigh skin lesions  *Pancytopenia secondary to chemotherapy  *Left lower extremity DVT. Xarelto held due to thrombocytopenia. Oncology consulted.    All the records are reviewed and case discussed with Care Management/Social Worker Management plans discussed with the patient, family and they are in agreement.  CODE STATUS: FULL CODE  DVT Prophylaxis: SCDs  TOTAL TIME TAKING CARE OF THIS PATIENT: 35 minutes.   POSSIBLE D/C IN 2-3 DAYS, DEPENDING ON CLINICAL CONDITION.  Leia Alf Lashanna Angelo M.D on 11/06/2017 at 1:38 PM  Between 7am to 6pm - Pager - [phone]  After 6pm go to [email or web address] - password [email or web address] [facility]  Office  [phone]  CC: Primary care physician; Patient, No Pcp Per  Note: This dictation was prepared with Dragon dictation along with smaller phrase technology. Any transcriptional errors that result from this process are unintentional.

## 2017-11-06 NOTE — Progress Notes (Signed)
Pharmacy Antibiotic Note  Edgar [name] is a 27 y.o. male admitted on 11/05/2017 with sepsis.  Pharmacy has been consulted for cefepime dosing.  Plan: Per ID note- cefepime is to cover for possible pseudomonas infection from nail wound. Therefore I will increase cefepime to 2g q 8 hours to better cover for pseudmonas.  Height: 6\' 3"  (190.5 cm) Weight: 280 lb 3.2 oz (127.1 kg) IBW/kg (Calculated) : 84.5  Temp (24hrs), Avg:100.1 F (37.8 C), Min:98.9 F (37.2 C), Max:102.2 F (39 C)  Recent Labs  Lab 11/01/17 1039 11/04/17 2340 11/05/17 0943 11/05/17 1055 11/06/17 0531  WBC 0.7* 7.7 7.4  --  9.3  CREATININE 0.80 0.88 0.85  --  0.80  LATICACIDVEN  --  1.0  --  1.1  --     Estimated Creatinine Clearance: 195.6 mL/min (by C-G formula based on SCr of 0.8 mg/dL).    No Known Allergies  Antimicrobials this admission:   Dose adjustments this admission:   Microbiology results:  BCx:   UCx:    Sputum:    MRSA PCR:   Thank you for allowing pharmacy to be a part of this patient's care.  Ramond Dial, Pharm.D., BCPS Clinical Pharmacist 11/06/2017 10:35 AM

## 2017-11-06 NOTE — Progress Notes (Signed)
Hematology/Oncology Progress Note [facility] [phone] [phone]  Patient Care Team: Patient, No Pcp Per as PCP - General (General Practice)   Name of the patient: Edgar [name]  [id number]  27-Mar-1990  Date of visit: 11/06/17   INTERVAL HISTORY-  No acute overnight event.  Reports headache, continue to have left thigh pain. Afebrile today Mild right leg swelling and also new lesion on right thigh.   Review of systems- Review of Systems  Constitutional: Positive for malaise/fatigue. Negative for fever.  HENT: Negative for sore throat.   Eyes: Negative for pain.  Respiratory: Negative for cough and shortness of breath.   Cardiovascular: Negative for chest pain.  Gastrointestinal: Negative for vomiting.  Musculoskeletal:       LE pain and swelling.   Neurological: Positive for headaches.  Psychiatric/Behavioral: Negative for depression and hallucinations.    No Known Allergies  Patient Active Problem List   Diagnosis Date Noted  . Thrombocytopenia ([place]) 11/06/2017  . Lower extremity edema 11/06/2017  . Hypomagnesemia 11/05/2017  . Sepsis ([place]) 11/05/2017  . Scrotal cancer ([place]) 10/21/2017  . Pain   . History of testicular cancer   . DVT (deep venous thrombosis) ([place])   . Acute deep vein thrombosis (DVT) of proximal vein of left lower extremity ([place]) 10/09/2017  . Diarrhea 10/09/2017  . Goals of care, counseling/discussion 10/09/2017  . Normocytic anemia   . Cellulitis 10/08/2017  . Current moderate episode of major depressive disorder without prior episode ([place]) 11/20/2016  . Encounter for antineoplastic chemotherapy 01/19/2016  . Testicular cancer ([place]) 11/26/2015     Past Medical History:  Diagnosis Date  . Asthma    AS A CHILD-NO INHALERS  . Cancer ([place])    testicular cancer 11/2016 per pt   . DVT (deep venous thrombosis) ([place])   . GERD (gastroesophageal reflux disease)    TUMS PRN     Past Surgical  History:  Procedure Laterality Date  . ANKLE FRACTURE SURGERY Right 2004  . ORCHIECTOMY Left 11/26/2015   Procedure: ORCHIECTOMY;  Surgeon: [name], MD;  Location: [facility] ORS;  Service: Urology;  Laterality: Left;  radical/Inguinal approach  . PERIPHERAL VASCULAR CATHETERIZATION N/A 12/16/2015   Procedure: [name] Cath Insertion;  Surgeon: [name], MD;  Location: [facility];  Service: Cardiovascular;  Laterality: N/A;  . PERIPHERAL VASCULAR THROMBECTOMY Left 10/13/2017   Procedure: PERIPHERAL VASCULAR THROMBECTOMY;  Surgeon: [name], MD;  Location: [facility];  Service: Cardiovascular;  Laterality: Left;  . WISDOM TOOTH EXTRACTION  2017    Social History   Socioeconomic History  . Marital status: Single    Spouse name: Not on file  . Number of children: Not on file  . Years of education: Not on file  . Highest education level: Not on file  Occupational History  . Not on file  Social Needs  . Financial resource strain: Patient refused  . Food insecurity:    Worry: Patient refused    Inability: Patient refused  . Transportation needs:    Medical: Patient refused    Non-medical: Patient refused  Tobacco Use  . Smoking status: Current Every Day Smoker    Packs/day: 0.50    Years: 8.00    Pack years: 4.00    Types: Cigarettes  . Smokeless tobacco: Former Systems developer    Types: Snuff  Substance and Sexual Activity  . Alcohol use: No  . Drug use: Yes    Types: Marijuana  .  Sexual activity: Yes  Lifestyle  . Physical activity:    Days per week: Not on file    Minutes per session: Not on file  . Stress: Not on file  Relationships  . Social connections:    Talks on phone: Not on file    Gets together: Not on file    Attends religious service: Not on file    Active member of club or organization: Not on file    Attends meetings of clubs or organizations: Not on file    Relationship status: Not on file  . Intimate partner violence:    Fear of  current or ex partner: Not on file    Emotionally abused: Not on file    Physically abused: Not on file    Forced sexual activity: Not on file  Other Topics Concern  . Not on file  Social History Narrative  . Not on file     Family History  Problem Relation Age of Onset  . Skin cancer Mother   . Breast cancer Maternal Grandmother   . Colon cancer Maternal Grandfather   . Diabetes Maternal Grandfather   . Emphysema Father   . Mental illness Sister        anxiety  . Stroke Paternal Grandmother   . Prostate cancer Neg Hx   . Kidney cancer Neg Hx   . Bladder Cancer Neg Hx      Current Facility-Administered Medications:  .  acetaminophen (TYLENOL) tablet 650 mg, 650 mg, Oral, Q6H PRN, 650 mg at 11/06/17 1030 **OR** acetaminophen (TYLENOL) suppository 650 mg, 650 mg, Rectal, Q6H PRN, Salary, Montell D, MD .  ceFEPIme (MAXIPIME) 2 g in sodium chloride 0.9 % 100 mL IVPB, 2 g, Intravenous, Q12H, Salary, Montell D, MD, Stopped at 11/06/17 1012 .  clindamycin (CLEOCIN) IVPB 900 mg, 900 mg, Intravenous, Q8H, Tsosie Billing, MD, Stopped at 11/06/17 1436 .  dexamethasone (DECADRON) tablet 4 mg, 4 mg, Oral, Daily, Salary, Montell D, MD, 4 mg at 11/06/17 0939 .  DULoxetine (CYMBALTA) DR capsule 60 mg, 60 mg, Oral, Daily, Salary, Montell D, MD, 60 mg at 11/06/17 0939 .  gabapentin (NEURONTIN) capsule 300 mg, 300 mg, Oral, QHS, Salary, Montell D, MD, 300 mg at 11/06/17 2116 .  heparin lock flush 100 unit/mL, 500 Units, Intracatheter, Daily PRN, Honor Loh E, NP .  heparin lock flush 100 unit/mL, 250 Units, Intracatheter, PRN, Honor Loh E, NP .  heparin lock flush 100 unit/mL, 500 Units, Intracatheter, Daily PRN, Honor Loh E, NP .  heparin lock flush 100 unit/mL, 250 Units, Intracatheter, PRN, Karen Kitchens, NP .  loratadine (CLARITIN) tablet 10 mg, 10 mg, Oral, Daily, Salary, Montell D, MD, 10 mg at 11/06/17 0939 .  magnesium hydroxide (MILK OF MAGNESIA) suspension 30 mL, 30 mL,  Oral, Daily PRN, Salary, Montell D, MD .  magnesium oxide (MAG-OX) tablet 400 mg, 400 mg, Oral, Daily, Salary, Montell D, MD, 400 mg at 11/06/17 0939 .  morphine 2 MG/ML injection 2 mg, 2 mg, Intravenous, Q2H PRN, Salary, Montell D, MD, 2 mg at 11/06/17 2116 .  nicotine (NICODERM CQ - dosed in mg/24 hours) patch 14 mg, 14 mg, Transdermal, Daily, Salary, Montell D, MD, 14 mg at 11/06/17 0940 .  ondansetron (ZOFRAN) tablet 4 mg, 4 mg, Oral, Q6H PRN **OR** ondansetron (ZOFRAN) injection 4 mg, 4 mg, Intravenous, Q6H PRN, Salary, Montell D, MD .  oxyCODONE (Oxy IR/ROXICODONE) immediate release tablet 10 mg, 10 mg, Oral, Q8H PRN, Sudini,  Srikar, MD, 10 mg at 11/06/17 1406 .  oxyCODONE (OXYCONTIN) 12 hr tablet 10 mg, 10 mg, Oral, Q12H, Salary, Montell D, MD, 10 mg at 11/06/17 2116 .  polyethylene glycol (MIRALAX / GLYCOLAX) packet 17 g, 17 g, Oral, Daily PRN, Salary, Montell D, MD .  potassium chloride (KLOR-CON) packet 20 mEq, 20 mEq, Oral, BID, Salary, Montell D, MD, 20 mEq at 11/06/17 2116 .  prochlorperazine (COMPAZINE) tablet 10 mg, 10 mg, Oral, Q6H PRN, Salary, Montell D, MD .  silver sulfADIAZINE (SILVADENE) 1 % cream, , Topical, BID, Salary, Montell D, MD .  silver sulfADIAZINE (SILVADENE) 1 % cream, , Topical, BID, Salary, Montell D, MD .  sodium chloride flush (NS) 0.9 % injection 10 mL, 10 mL, Intracatheter, PRN, Honor Loh E, NP .  sodium chloride flush (NS) 0.9 % injection 10 mL, 10 mL, Intracatheter, PRN, Honor Loh E, NP .  sodium chloride flush (NS) 0.9 % injection 3 mL, 3 mL, Intracatheter, PRN, Honor Loh E, NP .  sodium chloride flush (NS) 0.9 % injection 3 mL, 3 mL, Intracatheter, PRN, Karen Kitchens, NP  Facility-Administered Medications Ordered in Other Encounters:  .  heparin lock flush 100 unit/mL, 500 Units, Intravenous, Once, Corcoran, [name], MD .  sodium chloride flush (NS) 0.9 % injection 10 mL, 10 mL, Intravenous, PRN, Nolon Stalls C, MD, 10 mL at 10/21/17  1008   Physical exam:  Vitals:   11/06/17 0425 11/06/17 0426 11/06/17 0900 11/06/17 2004  BP: (!) 107/57 (!) 102/58 108/60 107/77  Pulse: (!) 124 (!) 123 (!) 120 (!) 101  Resp: 13  16 18   Temp: 99.8 F (37.7 C)  99.3 F (37.4 C) 97.9 F (36.6 C)  TempSrc: Oral  Oral Oral  SpO2: 93%  94% 97%  Weight:      Height:       GENERAL: No distress,  SKIN:  No rashes or significant lesions  HEAD: Normocephalic, No masses, lesions, tenderness or abnormalities  EYES: Conjunctiva are pink, non icteric ENT: External ears normal ,lips , buccal mucosa, and tongue normal and mucous membranes are moist  LYMPH: No palpable cervical and axillary lymphadenopathy  LUNGS: Clear to auscultation, no crackles or wheezes HEART: Regular rate & rhythm, no murmurs, no gallops, S1 normal and S2 normal  ABDOMEN: Abdomen soft, non-tender, normal bowel sounds, I did not appreciate any  masses or organomegaly  MUSCULOSKELETAL: No CVA tenderness and no tenderness on percussion of the back or rib cage.  EXTREMITIES: LLE 2+ edema, wrapped. Right thigh new round lesion, +1 emeda.  NEURO: Alert & oriented, no focal motor/sensory deficits.   CMP Latest Ref Rng & Units 11/06/2017  Glucose 70 - 99 mg/dL 129(H)  BUN 6 - 20 mg/dL 11  Creatinine 0.61 - 1.24 mg/dL 0.80  Sodium 135 - 145 mmol/L 137  Potassium 3.5 - 5.1 mmol/L 3.1(L)  Chloride 98 - 111 mmol/L 104  CO2 22 - 32 mmol/L 28  Calcium 8.9 - 10.3 mg/dL 7.9(L)  Total Protein 6.5 - 8.1 g/dL -  Total Bilirubin 0.3 - 1.2 mg/dL -  Alkaline Phos 38 - 126 U/L -  AST 15 - 41 U/L -  ALT 0 - 44 U/L -   CBC Latest Ref Rng & Units 11/06/2017  WBC 3.8 - 10.6 K/uL 9.3  Hemoglobin 13.0 - 18.0 g/dL 7.0(L)  Hematocrit 40.0 - 52.0 % 20.4(L)  Platelets 150 - 440 K/uL 47(L)   RADIOGRAPHIC STUDIES: I have personally reviewed the radiological images  as listed and agreed with the findings in the report.  Dg Chest 2 View  Result Date: 11/04/2017 CLINICAL DATA:  Fever,  cancer patient EXAM: CHEST - 2 VIEW COMPARISON:  CT 09/30/2016, radiograph 10/06/2017 FINDINGS: Right-sided central venous port tip over the proximal right atrium. Low lung volumes. No focal opacity or pleural effusion. Stable cardiomediastinal silhouette. No pneumothorax. Partially visualized IVC filter. IMPRESSION: No active cardiopulmonary disease. Electronically Signed   By: Donavan Foil M.D.   On: 11/04/2017 23:43   Ct Angio Ao+bifem W & Or Wo Contrast  Result Date: 11/05/2017 CLINICAL DATA:  27 year old male with sudden onset of a cold, painful left lower extremity. Past medical history of colon cancer and left lower extremity DVT. EXAM: CT ANGIOGRAPHY OF ABDOMINAL AORTA WITH ILIOFEMORAL RUNOFF TECHNIQUE: Multidetector CT imaging of the abdomen, pelvis and lower extremities was performed using the standard protocol during bolus administration of intravenous contrast. Multiplanar CT image reconstructions and MIPs were obtained to evaluate the vascular anatomy. CONTRAST:  149mL ISOVUE-370 IOPAMIDOL (ISOVUE-370) INJECTION 76% COMPARISON:  Prior CT scan of the abdomen and pelvis 10/10/2017 FINDINGS: VASCULAR Aorta: Normal caliber aorta without aneurysm, dissection, vasculitis or significant stenosis. Celiac: Patent without evidence of aneurysm, dissection, vasculitis or significant stenosis. SMA: Patent without evidence of aneurysm, dissection, vasculitis or significant stenosis. Renals: Both renal arteries are patent without evidence of aneurysm, dissection, vasculitis, fibromuscular dysplasia or significant stenosis. IMA: Patent without evidence of aneurysm, dissection, vasculitis or significant stenosis. RIGHT Lower Extremity Inflow: Common, internal and external iliac arteries are patent without evidence of aneurysm, dissection, vasculitis or significant stenosis. Outflow: Common, superficial and profunda femoral arteries and the popliteal artery are patent without evidence of aneurysm, dissection,  vasculitis or significant stenosis. Runoff: Patent three vessel runoff to the ankle. LEFT Lower Extremity Inflow: Common, internal and external iliac arteries are patent without evidence of aneurysm, dissection, vasculitis or significant stenosis. Outflow: Common, superficial and profunda femoral arteries and the popliteal artery are patent without evidence of aneurysm, dissection, vasculitis or significant stenosis. Runoff: Patent three vessel runoff to the ankle. Veins: Infrarenal IVC filter in place. No evidence of complication. The IVC and pelvic veins are all patent including the left external and common iliac venous stent. No evidence of left lower extremity DVT. Review of the MIP images confirms the above findings. NON-VASCULAR Lower chest: No acute abnormality. Hepatobiliary: No focal liver abnormality is seen. No gallstones, gallbladder wall thickening, or biliary dilatation. Pancreas: Unremarkable. No pancreatic ductal dilatation or surrounding inflammatory changes. Spleen: Normal in size without focal abnormality. Adrenals/Urinary Tract: Adrenal glands are unremarkable. Kidneys are normal, without renal calculi, focal lesion, or hydronephrosis. Bladder is unremarkable. Stomach/Bowel: Stomach is within normal limits. Appendix appears normal. No evidence of bowel wall thickening, distention, or inflammatory changes. Lymphatic: Stable right para-aortic lymph node measuring approximately 9 x 9 mm (image 84 series 4); left para-aortic lymph node measures slightly smaller at 8 mm compared to 12 mm previously. Left para-aortic lymph node more inferiorly is also decreasing in size presently at 15 mm compared to 17 mm previously. Left common iliac station node measures approximately 2.5 x 1.6 cm compared to 3.3 x 1.6 cm. Left external iliac station node measures approximately 3.3 x 2.6 cm compared to 4.0 x 3.7 cm. Reproductive: Surgical changes suggest left orchiectomy. Persistent postoperative changes present in  the region the left inguinal canal. Other: No abdominal wall hernia or abnormality. No abdominopelvic ascites. Musculoskeletal: No acute or significant osseous findings. Significantly decreased left lower extremity swelling.  There is some persistent skin thickening along the medial aspect of the left upper thigh. IMPRESSION: VASCULAR 1. No acute arterial or venous abnormality. 2. Patent left common and external iliac venous stents. 3. Well-positioned potentially retrievable infrarenal IVC filter. NON-VASCULAR 1. Interval response to therapy with decreasing left pelvic and retroperitoneal/para-aortic lymphadenopathy. 2. Postoperative changes in the left inguinal and left groin region as expected. 3. Significantly decreased left lower extremity edema. 4. There is some persistent skin thickening in the medial aspect of the left thigh in the region of the reported wounds. Electronically Signed   By: Jacqulynn Cadet M.D.   On: 11/05/2017 16:07   Ct Femur Left W Contrast  Result Date: 10/08/2017 CLINICAL DATA:  Left lower extremity cellulitis and possible necrotizing fasciitis. EXAM: CT OF THE LOWER RIGHT EXTREMITY WITH CONTRAST TECHNIQUE: Multidetector CT imaging of the lower right extremity was performed according to the standard protocol following intravenous contrast administration. COMPARISON:  None. CONTRAST:  163mL ISOVUE-300 IOPAMIDOL (ISOVUE-300) INJECTION 61% FINDINGS: Mild inflammatory stranding of the subcutaneous tissues of the left thigh is noted anteriorly and medially suggesting cellulitis. No definite abscess or fluid collection is noted. No definite bony abnormality or fracture is noted involving the visualized bone. 3.7 cm left external iliac lymph node is noted consistent with metastatic disease. Probable filling defect is seen in the left common femoral and external iliac vein concerning for thrombus. Potentially this may extend into the left superficial femoral vein. IMPRESSION: Mild  inflammatory stranding of the subcutaneous tissues of the left thigh is noted anteriorly and medially suggesting cellulitis or possibly edema. No abscess or fluid collection is noted. Filling defect is noted in the visualized portion of the left external iliac vein and common femoral vein suggesting deep venous thrombosis. Potentially this may extended to the left superficial femoral vein and further evaluation with Doppler ultrasound is recommended. These results will be called to the ordering clinician or representative by the Radiologist Assistant, and communication documented in the PACS or zVision Dashboard. 3.7 cm left external iliac lymph node is noted consistent with metastatic disease. Electronically Signed   By: Marijo Conception, M.D.   On: 10/08/2017 22:24   US Venous Img Lower Bilateral  Result Date: 11/05/2017 CLINICAL DATA:  Bilateral lower extremity edema EXAM: BILATERAL LOWER EXTREMITY VENOUS DOPPLER ULTRASOUND TECHNIQUE: Gray-scale sonography with compression, as well as color and duplex ultrasound, were performed to evaluate the deep venous system from the level of the common femoral vein through the popliteal and proximal calf veins. COMPARISON:  10/09/2017 FINDINGS: Normal compressibility of the common femoral, superficial femoral, and popliteal veins, as well as the proximal calf veins. No filling defects to suggest DVT on grayscale or color Doppler imaging. Doppler waveforms show normal direction of venous flow, normal respiratory phasicity and response to augmentation. IMPRESSION: No evidence of  lower extremity deep vein thrombosis. Electronically Signed   By: Lucrezia Europe M.D.   On: 11/05/2017 13:03   US Venous Img Lower Unilateral Left  Result Date: 10/09/2017 CLINICAL DATA:  Left lower extremity cellulitis with CT demonstrating possible DVT in the common femoral and external iliac veins. EXAM: LEFT LOWER EXTREMITY VENOUS DOPPLER ULTRASOUND TECHNIQUE: Gray-scale sonography with graded  compression, as well as color Doppler and duplex ultrasound were performed to evaluate the lower extremity deep venous systems from the level of the common femoral vein and including the common femoral, femoral, profunda femoral, popliteal and calf veins including the posterior tibial, peroneal and gastrocnemius veins when visible. The  superficial great saphenous vein was also interrogated. Spectral Doppler was utilized to evaluate flow at rest and with distal augmentation maneuvers in the common femoral, femoral and popliteal veins. COMPARISON:  None. FINDINGS: Contralateral Common Femoral Vein: Respiratory phasicity is normal and symmetric with the symptomatic side. No evidence of thrombus. Normal compressibility. Common Femoral Vein: No evidence of thrombus. Normal compressibility, respiratory phasicity and response to augmentation. Saphenofemoral Junction: No evidence of thrombus. Normal compressibility and flow on color Doppler imaging. Profunda Femoral Vein: No evidence of thrombus. Normal compressibility and flow on color Doppler imaging. Femoral Vein: No evidence of thrombus. Normal compressibility, respiratory phasicity and response to augmentation. Popliteal Vein: No evidence of thrombus. Normal compressibility, respiratory phasicity and response to augmentation. Calf Veins: No evidence of thrombus. Normal compressibility and flow on color Doppler imaging. Superficial Great Saphenous Vein: No evidence of thrombus. Normal compressibility. Venous Reflux:  None. Other Findings: No evidence of superficial thrombophlebitis or abnormal fluid collection. IMPRESSION: No evidence of left lower extremity deep venous thrombosis. CT findings are felt to likely relate to streaming of contrast during early opacification of the venous system. If there is any progressive edema of the left lower extremity, follow-up duplex ultrasound may be helpful. Electronically Signed   By: Aletta Edouard M.D.   On: 10/09/2017 15:27    Dg Foot Complete Right  Result Date: 11/05/2017 CLINICAL DATA:  Stepped on a nail 3 days ago. Evaluate for osteomyelitis. EXAM: RIGHT FOOT COMPLETE - 3+ VIEW COMPARISON:  None. FINDINGS: Faint radiopaque densities about the skin and soft tissues subjacent to the first-second interspace may be site of puncture wound. No soft tissue air. No fracture or dislocation. No osseous erosions or bony destructive change. No periosteal reaction. Diffuse soft tissue edema. Screws in the distal tibia partially included. IMPRESSION: Scattered faint radiopaque densities about the skin and soft tissues subjacent to the first-interspace may be site of puncture wound. No radiographic findings of osteomyelitis. Electronically Signed   By: Jeb Levering M.D.   On: 11/05/2017 01:04   Ct Extremity Lower Left W Contrast  Addendum Date: 10/13/2017   ADDENDUM REPORT: 10/13/2017 08:09 ADDENDUM: Prior outside CT examination from 09/12/2017 from [facility] has been obtained and is compared to this study. The abdominal/pelvic lymphadenopathy is vastly improved when compared to the study from 09/12/2017. Interaortocaval adenopathy on the prior study measured 2.6 x 2.0 cm and now measures 0.8 x 0.7 cm. Left common iliac lymphadenopathy on the prior study measured 4.6 x 3.9 cm and now measures 3.2 x 1.6 cm. The necrotic adenopathy in the left external iliac region measured 5.6 x 4.1 cm and now measures 4.0 x 3.7 cm on this study. The deep venous thrombosis appears unchanged. Electronically Signed   By: Marijo Sanes M.D.   On: 10/13/2017 08:09   Result Date: 10/13/2017 CLINICAL DATA:  History of testicular cancer with LEFT LOWER EXTREMITY PAIN AND SWELLING. EXAM: CT ABDOMEN/PELVIS WITH CONTRAST. CT OF THE LOWER LEFT EXTREMITY WITH CONTRAST TECHNIQUE: Multidetector CT imaging of the abdomen and pelvis was performed using the standard protocol following bolus administration of intravenous contrast. CT examination of the left  lower extremity was also performed. CONTRAST:  170mL ISOVUE-300 IOPAMIDOL (ISOVUE-300) INJECTION 61% COMPARISON:  Prior CT scan from 2 days ago. FINDINGS: Lower chest: The lung bases are clear of acute process. Minimal dependent bibasilar atelectasis. No pleural effusion or pulmonary lesions. The heart is normal in size. No pericardial effusion. The distal esophagus and aorta are unremarkable. Hepatobiliary: No  focal hepatic lesions or intrahepatic biliary dilatation. The gallbladder is mildly contracted. No common bile duct dilatation. Pancreas: No mass, inflammation or ductal dilatation. Spleen: Normal size.  No focal lesions. Adrenals/Urinary Tract: The adrenal glands and kidneys are unremarkable. No renal, ureteral or bladder calculi or mass. Stomach/Bowel: The stomach, duodenum, small bowel and colon are unremarkable. No acute inflammatory changes, mass lesions or obstructive findings. The terminal ileum and appendix are normal. Vascular/Lymphatic: The aorta and branch vessels are normal. The IVC is patent. There is retroperitoneal lymphadenopathy mainly in the left para-aortic region. 11.5 mm lymph node noted on image number 54. 17.5 mm left para-aortic lymph node on image number 66. 16 mm left common iliac lymph node on image number 74. Again demonstrated is a large necrotic appearing nodal mass in the medial left external iliac region. This measures a maximum of 3.7 cm and adjacent to this the left external iliac vein is thrombosed. Thrombus extends down into the common femoral vein to just above the profundus femoral vein. The right-sided venous structures are patent. No deep venous thrombosis is identified in the left thigh. The femoral and popliteal veins are patent. Reproductive: The prostate gland and seminal vesicles are unremarkable. Other: No intrapelvic or intra-abdominal fluid collections. Musculoskeletal: No significant bony findings. LEFT LOWER EXTREMITY: Extensive subcutaneous soft tissue  swelling/edema/fluid involving the entire imaged left thigh with associated skin thickening and areas of skin blistering. This could be due to the deep venous thrombosis and or cellulitis. Is also diffuse edema in the muscular compartments of the thigh consistent with myofasciitis. No discrete abscess or findings to suggest pyomyositis. No knee or hip joint effusions to suggest septic arthritis. Enlarged left axillary lymph nodes likely inflammatory. IMPRESSION: 1. Left-sided retroperitoneal lymphadenopathy and left pelvic lymphadenopathy likely metastatic testicular cancer. The large necrotic appearing left medial external iliac lymph node is likely compressing the left external iliac vein and causing deep venous thrombosis extending down into the left groin. This does not extend down into the left thigh. 2. Marked and diffuse subcutaneous soft tissue swelling/edema/fluid along with skin thickening and skin blisters involving the left thigh. This could be due to the deep venous thrombosis or possibly could be secondary cellulitis and myofasciitis. No discrete drainable abscess is identified. 3. No definite CT findings for septic arthritis or osteomyelitis and no findings for metastatic disease involving the lung bases or bony structures. Electronically Signed: By: Marijo Sanes M.D. On: 10/10/2017 15:28   Ct Venogram Abd/pel  Addendum Date: 10/13/2017   ADDENDUM REPORT: 10/13/2017 08:09 ADDENDUM: Prior outside CT examination from 09/12/2017 from [facility] has been obtained and is compared to this study. The abdominal/pelvic lymphadenopathy is vastly improved when compared to the study from 09/12/2017. Interaortocaval adenopathy on the prior study measured 2.6 x 2.0 cm and now measures 0.8 x 0.7 cm. Left common iliac lymphadenopathy on the prior study measured 4.6 x 3.9 cm and now measures 3.2 x 1.6 cm. The necrotic adenopathy in the left external iliac region measured 5.6 x 4.1 cm and now measures 4.0  x 3.7 cm on this study. The deep venous thrombosis appears unchanged. Electronically Signed   By: Marijo Sanes M.D.   On: 10/13/2017 08:09   Result Date: 10/13/2017 CLINICAL DATA:  History of testicular cancer with LEFT LOWER EXTREMITY PAIN AND SWELLING. EXAM: CT ABDOMEN/PELVIS WITH CONTRAST. CT OF THE LOWER LEFT EXTREMITY WITH CONTRAST TECHNIQUE: Multidetector CT imaging of the abdomen and pelvis was performed using the standard protocol following bolus  administration of intravenous contrast. CT examination of the left lower extremity was also performed. CONTRAST:  121mL ISOVUE-300 IOPAMIDOL (ISOVUE-300) INJECTION 61% COMPARISON:  Prior CT scan from 2 days ago. FINDINGS: Lower chest: The lung bases are clear of acute process. Minimal dependent bibasilar atelectasis. No pleural effusion or pulmonary lesions. The heart is normal in size. No pericardial effusion. The distal esophagus and aorta are unremarkable. Hepatobiliary: No focal hepatic lesions or intrahepatic biliary dilatation. The gallbladder is mildly contracted. No common bile duct dilatation. Pancreas: No mass, inflammation or ductal dilatation. Spleen: Normal size.  No focal lesions. Adrenals/Urinary Tract: The adrenal glands and kidneys are unremarkable. No renal, ureteral or bladder calculi or mass. Stomach/Bowel: The stomach, duodenum, small bowel and colon are unremarkable. No acute inflammatory changes, mass lesions or obstructive findings. The terminal ileum and appendix are normal. Vascular/Lymphatic: The aorta and branch vessels are normal. The IVC is patent. There is retroperitoneal lymphadenopathy mainly in the left para-aortic region. 11.5 mm lymph node noted on image number 54. 17.5 mm left para-aortic lymph node on image number 66. 16 mm left common iliac lymph node on image number 74. Again demonstrated is a large necrotic appearing nodal mass in the medial left external iliac region. This measures a maximum of 3.7 cm and adjacent to this  the left external iliac vein is thrombosed. Thrombus extends down into the common femoral vein to just above the profundus femoral vein. The right-sided venous structures are patent. No deep venous thrombosis is identified in the left thigh. The femoral and popliteal veins are patent. Reproductive: The prostate gland and seminal vesicles are unremarkable. Other: No intrapelvic or intra-abdominal fluid collections. Musculoskeletal: No significant bony findings. LEFT LOWER EXTREMITY: Extensive subcutaneous soft tissue swelling/edema/fluid involving the entire imaged left thigh with associated skin thickening and areas of skin blistering. This could be due to the deep venous thrombosis and or cellulitis. Is also diffuse edema in the muscular compartments of the thigh consistent with myofasciitis. No discrete abscess or findings to suggest pyomyositis. No knee or hip joint effusions to suggest septic arthritis. Enlarged left axillary lymph nodes likely inflammatory. IMPRESSION: 1. Left-sided retroperitoneal lymphadenopathy and left pelvic lymphadenopathy likely metastatic testicular cancer. The large necrotic appearing left medial external iliac lymph node is likely compressing the left external iliac vein and causing deep venous thrombosis extending down into the left groin. This does not extend down into the left thigh. 2. Marked and diffuse subcutaneous soft tissue swelling/edema/fluid along with skin thickening and skin blisters involving the left thigh. This could be due to the deep venous thrombosis or possibly could be secondary cellulitis and myofasciitis. No discrete drainable abscess is identified. 3. No definite CT findings for septic arthritis or osteomyelitis and no findings for metastatic disease involving the lung bases or bony structures. Electronically Signed: By: Marijo Sanes M.D. On: 10/10/2017 15:28   Dg Femur Min 2 Views Left  Result Date: 10/08/2017 CLINICAL DATA:  Fever with left leg  swelling. EXAM: LEFT FEMUR 2 VIEWS COMPARISON:  None. FINDINGS: There is no evidence of fracture or other focal bone lesions. Diffuse soft tissue swelling. No subcutaneous emphysema. IMPRESSION: Diffuse soft tissue swelling.  No subcutaneous emphysema. Electronically Signed   By: Titus Dubin M.D.   On: 10/08/2017 17:11   Ct Outside Films Chest  Result Date: 10/12/2017 This examination belongs to an outside facility and is stored here for comparison purposes only.  Contact the originating outside institution for any associated report or interpretation.  Mr  Outside Films Head/face  Result Date: 10/12/2017 This examination belongs to an outside facility and is stored here for comparison purposes only.  Contact the originating outside institution for any associated report or interpretation.  Ct Outside Films Body  Result Date: 10/12/2017 This examination belongs to an outside facility and is stored here for comparison purposes only.  Contact the originating outside institution for any associated report or interpretation.  Ct Outside Films Body  Result Date: 10/12/2017 This examination belongs to an outside facility and is stored here for comparison purposes only.  Contact the originating outside institution for any associated report or interpretation.   Assessment and plan-  # left lower extremity cellulitis, continue IV antibiotics. # Recent left lower extremity DVT: s/p IVC filter and thrombectomy and thrombolysis.  Last Xarelto dose was last night. No acute bleeding. Xarelto was discontinued after yesterday's dose.   # Thrombocytopenia, multifactorial due to recent chemotherapy and infection/thrombosis.  Platelet count improved today to 47,000. Anticoagulation was on hold. Plan start on heparin gtt without bolus once plt is above 50,000.   # Testicular cancer, outpatient follow up with Dr.Corcoran.   Thank you for allowing me to participate in the care of this patient.  Total face  to face encounter time for this patient visit was 25 min. >50% of the time was  spent in counseling and coordination of care.  Earlie Server, MD, PhD Hematology Oncology [facility] Pager- [phone] 11/06/2017

## 2017-11-07 DIAGNOSIS — Z86718 Personal history of other venous thrombosis and embolism: Secondary | ICD-10-CM

## 2017-11-07 DIAGNOSIS — I82409 Acute embolism and thrombosis of unspecified deep veins of unspecified lower extremity: Secondary | ICD-10-CM

## 2017-11-07 DIAGNOSIS — L03116 Cellulitis of left lower limb: Secondary | ICD-10-CM

## 2017-11-07 DIAGNOSIS — R600 Localized edema: Secondary | ICD-10-CM

## 2017-11-07 LAB — CBC WITH DIFFERENTIAL/PLATELET
Basophils Absolute: 0 10*3/uL (ref 0–0.1)
Basophils Relative: 0 %
Eosinophils Absolute: 0 10*3/uL (ref 0–0.7)
Eosinophils Relative: 0 %
HCT: 21.5 % — ABNORMAL LOW (ref 40.0–52.0)
Hemoglobin: 7.2 g/dL — ABNORMAL LOW (ref 13.0–18.0)
Lymphocytes Relative: 3 %
Lymphs Abs: 0.5 10*3/uL — ABNORMAL LOW (ref 1.0–3.6)
MCH: 30.8 pg (ref 26.0–34.0)
MCHC: 33.5 g/dL (ref 32.0–36.0)
MCV: 91.7 fL (ref 80.0–100.0)
Monocytes Absolute: 1.2 10*3/uL — ABNORMAL HIGH (ref 0.2–1.0)
Monocytes Relative: 7 %
Neutro Abs: 15.4 10*3/uL — ABNORMAL HIGH (ref 1.4–6.5)
Neutrophils Relative %: 90 %
Platelets: 64 10*3/uL — ABNORMAL LOW (ref 150–440)
RBC: 2.34 MIL/uL — ABNORMAL LOW (ref 4.40–5.90)
RDW: 16.2 % — ABNORMAL HIGH (ref 11.5–14.5)
WBC: 17.1 10*3/uL — ABNORMAL HIGH (ref 3.8–10.6)

## 2017-11-07 LAB — HEPARIN LEVEL (UNFRACTIONATED)
HEPARIN UNFRACTIONATED: 0.2 [IU]/mL — AB (ref 0.30–0.70)
Heparin Unfractionated: 0.11 IU/mL — ABNORMAL LOW (ref 0.30–0.70)

## 2017-11-07 LAB — APTT: aPTT: 37 seconds — ABNORMAL HIGH (ref 24–36)

## 2017-11-07 LAB — PROTIME-INR
INR: 1.13
Prothrombin Time: 14.4 seconds (ref 11.4–15.2)

## 2017-11-07 MED ORDER — HEPARIN (PORCINE) IN NACL 100-0.45 UNIT/ML-% IJ SOLN
2150.0000 [IU]/h | INTRAMUSCULAR | Status: AC
  Administered 2017-11-07: 1750 [IU]/h via INTRAVENOUS
  Administered 2017-11-08: 2150 [IU]/h via INTRAVENOUS
  Administered 2017-11-08: 1950 [IU]/h via INTRAVENOUS
  Administered 2017-11-09 (×2): 2150 [IU]/h via INTRAVENOUS
  Filled 2017-11-07 (×4): qty 250

## 2017-11-07 MED ORDER — SODIUM CHLORIDE 0.9 % IV SOLN
INTRAVENOUS | Status: DC | PRN
  Administered 2017-11-07 – 2017-11-08 (×2): 250 mL via INTRAVENOUS

## 2017-11-07 NOTE — Progress Notes (Signed)
[facility]  Daily Progress Note   Subjective  - * No surgery found *  Legs still with sores.  Some blistering on the posterior left calf.  Right leg lesions are about the same.  Elevating his legs.  Objective Vitals:   11/06/17 0900 11/06/17 2004 11/07/17 0506 11/07/17 0930  BP: 108/60 107/77 113/76 110/69  Pulse: (!) 120 (!) 101 83 86  Resp: 16 18 18 16   Temp: 99.3 F (37.4 C) 97.9 F (36.6 C) 97.7 F (36.5 C) 97.8 F (36.6 C)  TempSrc: Oral Oral Oral Oral  SpO2: 94% 97% 98% 99%  Weight:      Height:        Intake/Output Summary (Last 24 hours) at 11/07/2017 1157 Last data filed at 11/07/2017 0700 Gross per 24 hour  Intake 363.09 ml  Output -  Net 363.09 ml     CV  RRR VASC  1-2+ left lower extremity swelling.  Strong pedal pulses bilaterally.  Laboratory CBC    Component Value Date/Time   WBC 17.1 (H) 11/07/2017 0541   HGB 7.2 (L) 11/07/2017 0541   HCT 21.5 (L) 11/07/2017 0541   PLT 64 (L) 11/07/2017 0541    BMET    Component Value Date/Time   NA 137 11/06/2017 0531   K 3.1 (L) 11/06/2017 0531   CL 104 11/06/2017 0531   CO2 28 11/06/2017 0531   GLUCOSE 129 (H) 11/06/2017 0531   BUN 11 11/06/2017 0531   CREATININE 0.80 11/06/2017 0531   CALCIUM 7.9 (L) 11/06/2017 0531   GFRNONAA >60 11/06/2017 0531   GFRAA >60 11/06/2017 0531    Assessment/Planning:    Bilateral lower extremity skin lesions of unclear etiology.  Plan for skin biopsy on Tuesday.  History of DVT with venous intervention and IVC filter placement.  Platelet count is now up to 64,000 so would consider starting a heparin drip with no bolus.  Hematology/oncology also to see and their input on this would also be helpful.  Leg swelling.  Continue leg elevation and compression.  May be slightly better today    Leotis Pain  11/07/2017, 11:57 AM

## 2017-11-07 NOTE — Progress Notes (Signed)
Hematology/Oncology Progress Note [facility] Telephone:([phone] [phone]  Patient Care Team: Patient, No Pcp Per as PCP - General (General Practice)   Name of the patient: Edgar [name]  [id number]  08/16/1990  Date of visit: 11/07/17   INTERVAL HISTORY-  No acute overnight event. Headache has resolved. Afebrile.  Continue to have lower extremity pain. Left thigh lesion blistering.  Right thigh 2 lesion.   Review of systems- Review of Systems  Constitutional: Positive for malaise/fatigue. Negative for chills, fever and weight loss.  HENT: Negative for sore throat.   Eyes: Negative for pain.  Respiratory: Negative for cough and shortness of breath.   Cardiovascular: Negative for chest pain.  Gastrointestinal: Negative for vomiting.  Musculoskeletal:       LE pain and swelling.   Neurological: Negative for headaches.  Endo/Heme/Allergies: Does not bruise/bleed easily.  Psychiatric/Behavioral: Negative for depression and hallucinations.    No Known Allergies  Patient Active Problem List   Diagnosis Date Noted  . Thrombocytopenia ([place]) 11/06/2017  . Lower extremity edema 11/06/2017  . Anemia due to antineoplastic chemotherapy   . Chronic anticoagulation   . Hypomagnesemia 11/05/2017  . Sepsis ([place]) 11/05/2017  . Scrotal cancer ([place]) 10/21/2017  . Pain   . History of testicular cancer   . DVT (deep venous thrombosis) ([place])   . Acute deep vein thrombosis (DVT) of proximal vein of left lower extremity ([place]) 10/09/2017  . Diarrhea 10/09/2017  . Goals of care, counseling/discussion 10/09/2017  . Normocytic anemia   . Cellulitis 10/08/2017  . Current moderate episode of major depressive disorder without prior episode ([place]) 11/20/2016  . Encounter for antineoplastic chemotherapy 01/19/2016  . Testicular cancer ([place]) 11/26/2015     Past Medical History:  Diagnosis Date  . Asthma    AS A CHILD-NO INHALERS  . Cancer ([place])    testicular cancer 11/2016 per pt   . DVT (deep venous thrombosis) ([place])   . GERD (gastroesophageal reflux disease)    TUMS PRN     Past Surgical History:  Procedure Laterality Date  . ANKLE FRACTURE SURGERY Right 2004  . ORCHIECTOMY Left 11/26/2015   Procedure: ORCHIECTOMY;  Surgeon: [name], MD;  Location: [facility] ORS;  Service: Urology;  Laterality: Left;  radical/Inguinal approach  . PERIPHERAL VASCULAR CATHETERIZATION N/A 12/16/2015   Procedure: [name] Cath Insertion;  Surgeon: [name], MD;  Location: [facility];  Service: Cardiovascular;  Laterality: N/A;  . PERIPHERAL VASCULAR THROMBECTOMY Left 10/13/2017   Procedure: PERIPHERAL VASCULAR THROMBECTOMY;  Surgeon: [name], MD;  Location: [facility];  Service: Cardiovascular;  Laterality: Left;  . WISDOM TOOTH EXTRACTION  2017    Social History   Socioeconomic History  . Marital status: Single    Spouse name: Not on file  . Number of children: Not on file  . Years of education: Not on file  . Highest education level: Not on file  Occupational History  . Not on file  Social Needs  . Financial resource strain: Patient refused  . Food insecurity:    Worry: Patient refused    Inability: Patient refused  . Transportation needs:    Medical: Patient refused    Non-medical: Patient refused  Tobacco Use  . Smoking status: Current Every Day Smoker    Packs/day: 0.50    Years: 8.00    Pack years: 4.00    Types: Cigarettes  . Smokeless tobacco: Former Systems developer    Types: Snuff  Substance  and Sexual Activity  . Alcohol use: No  . Drug use: Yes    Types: Marijuana  . Sexual activity: Yes  Lifestyle  . Physical activity:    Days per week: Not on file    Minutes per session: Not on file  . Stress: Not on file  Relationships  . Social connections:    Talks on phone: Not on file    Gets together: Not on file    Attends religious service: Not on file    Active member of club or organization: Not  on file    Attends meetings of clubs or organizations: Not on file    Relationship status: Not on file  . Intimate partner violence:    Fear of current or ex partner: Not on file    Emotionally abused: Not on file    Physically abused: Not on file    Forced sexual activity: Not on file  Other Topics Concern  . Not on file  Social History Narrative  . Not on file     Family History  Problem Relation Age of Onset  . Skin cancer Mother   . Breast cancer Maternal Grandmother   . Colon cancer Maternal Grandfather   . Diabetes Maternal Grandfather   . Emphysema Father   . Mental illness Sister        anxiety  . Stroke Paternal Grandmother   . Prostate cancer Neg Hx   . Kidney cancer Neg Hx   . Bladder Cancer Neg Hx      Current Facility-Administered Medications:  .  acetaminophen (TYLENOL) tablet 650 mg, 650 mg, Oral, Q6H PRN, 650 mg at 11/06/17 1030 **OR** acetaminophen (TYLENOL) suppository 650 mg, 650 mg, Rectal, Q6H PRN, Salary, Montell D, MD .  ceFEPIme (MAXIPIME) 2 g in sodium chloride 0.9 % 100 mL IVPB, 2 g, Intravenous, Q12H, Salary, Montell D, MD, Last Rate: 200 mL/hr at 11/07/17 0937, 2 g at 11/07/17 0937 .  clindamycin (CLEOCIN) IVPB 900 mg, 900 mg, Intravenous, Q8H, [name], [name], MD, Last Rate: 100 mL/hr at 11/07/17 1406, 900 mg at 11/07/17 1406 .  dexamethasone (DECADRON) tablet 4 mg, 4 mg, Oral, Daily, Salary, Montell D, MD, 4 mg at 11/07/17 0932 .  DULoxetine (CYMBALTA) DR capsule 60 mg, 60 mg, Oral, Daily, Salary, Montell D, MD, 60 mg at 11/07/17 0932 .  gabapentin (NEURONTIN) capsule 300 mg, 300 mg, Oral, QHS, Salary, Montell D, MD, 300 mg at 11/06/17 2116 .  heparin ADULT infusion 100 units/mL (25000 units/247mL sodium chloride 0.45%), 1,750 Units/hr, Intravenous, Continuous, Lifsey, Betti Cruz, RPH .  heparin lock flush 100 unit/mL, 500 Units, Intracatheter, Daily PRN, Honor Loh E, NP .  heparin lock flush 100 unit/mL, 250 Units, Intracatheter, PRN, Honor Loh E, NP .  heparin lock flush 100 unit/mL, 500 Units, Intracatheter, Daily PRN, Honor Loh E, NP .  heparin lock flush 100 unit/mL, 250 Units, Intracatheter, PRN, Karen Kitchens, NP .  loratadine (CLARITIN) tablet 10 mg, 10 mg, Oral, Daily, Salary, Montell D, MD, 10 mg at 11/07/17 0932 .  magnesium hydroxide (MILK OF MAGNESIA) suspension 30 mL, 30 mL, Oral, Daily PRN, Salary, Montell D, MD .  magnesium oxide (MAG-OX) tablet 400 mg, 400 mg, Oral, Daily, Salary, Montell D, MD, 400 mg at 11/07/17 0932 .  morphine 2 MG/ML injection 2 mg, 2 mg, Intravenous, Q2H PRN, Salary, Montell D, MD, 2 mg at 11/07/17 1406 .  nicotine (NICODERM CQ - dosed in mg/24 hours) patch 14 mg,  14 mg, Transdermal, Daily, Salary, Montell D, MD, 14 mg at 11/07/17 0932 .  ondansetron (ZOFRAN) tablet 4 mg, 4 mg, Oral, Q6H PRN **OR** ondansetron (ZOFRAN) injection 4 mg, 4 mg, Intravenous, Q6H PRN, Salary, Montell D, MD .  oxyCODONE (Oxy IR/ROXICODONE) immediate release tablet 10 mg, 10 mg, Oral, Q8H PRN, Sudini, Srikar, MD, 10 mg at 11/07/17 1013 .  oxyCODONE (OXYCONTIN) 12 hr tablet 10 mg, 10 mg, Oral, Q12H, Salary, Montell D, MD, 10 mg at 11/07/17 0932 .  polyethylene glycol (MIRALAX / GLYCOLAX) packet 17 g, 17 g, Oral, Daily PRN, Salary, Montell D, MD .  potassium chloride (KLOR-CON) packet 20 mEq, 20 mEq, Oral, BID, Salary, Montell D, MD, 20 mEq at 11/07/17 0932 .  prochlorperazine (COMPAZINE) tablet 10 mg, 10 mg, Oral, Q6H PRN, Salary, Montell D, MD .  silver sulfADIAZINE (SILVADENE) 1 % cream, , Topical, BID, Salary, Montell D, MD .  sodium chloride flush (NS) 0.9 % injection 10 mL, 10 mL, Intracatheter, PRN, Honor Loh E, NP .  sodium chloride flush (NS) 0.9 % injection 10 mL, 10 mL, Intracatheter, PRN, Honor Loh E, NP .  sodium chloride flush (NS) 0.9 % injection 3 mL, 3 mL, Intracatheter, PRN, Honor Loh E, NP .  sodium chloride flush (NS) 0.9 % injection 3 mL, 3 mL, Intracatheter, PRN, Karen Kitchens,  NP  Facility-Administered Medications Ordered in Other Encounters:  .  heparin lock flush 100 unit/mL, 500 Units, Intravenous, Once, Corcoran, [name], MD .  sodium chloride flush (NS) 0.9 % injection 10 mL, 10 mL, Intravenous, PRN, Nolon Stalls C, MD, 10 mL at 10/21/17 1008   Physical exam:  Vitals:   11/06/17 0900 11/06/17 2004 11/07/17 0506 11/07/17 0930  BP: 108/60 107/77 113/76 110/69  Pulse: (!) 120 (!) 101 83 86  Resp: 16 18 18 16   Temp: 99.3 F (37.4 C) 97.9 F (36.6 C) 97.7 F (36.5 C) 97.8 F (36.6 C)  TempSrc: Oral Oral Oral Oral  SpO2: 94% 97% 98% 99%  Weight:      Height:       GENERAL: No distress, well nourished.  SKIN:  No rashes or significant lesions  HEAD: Normocephalic, No masses, lesions, tenderness or abnormalities  EYES: Conjunctiva are pink, non icteric ENT: External ears normal ,lips , buccal mucosa, and tongue normal and mucous membranes are moist  LYMPH: No palpable cervical and axillary lymphadenopathy  LUNGS: Clear to auscultation, no crackles or wheezes HEART: Regular rate & rhythm, no murmurs, no gallops, S1 normal and S2 normal  ABDOMEN: Abdomen soft, non-tender, normal bowel sounds, I did not appreciate any  masses or organomegaly  MUSCULOSKELETAL: No CVA tenderness and no tenderness on percussion of the back or rib cage.  EXTREMITIES: LLE 2+ edema, wrapped. Left thigh lesions. Right thigh new round lesion, +1 emeda. NEURO: Alert & oriented, no focal motor/sensory deficits.   CMP Latest Ref Rng & Units 11/06/2017  Glucose 70 - 99 mg/dL 129(H)  BUN 6 - 20 mg/dL 11  Creatinine 0.61 - 1.24 mg/dL 0.80  Sodium 135 - 145 mmol/L 137  Potassium 3.5 - 5.1 mmol/L 3.1(L)  Chloride 98 - 111 mmol/L 104  CO2 22 - 32 mmol/L 28  Calcium 8.9 - 10.3 mg/dL 7.9(L)  Total Protein 6.5 - 8.1 g/dL -  Total Bilirubin 0.3 - 1.2 mg/dL -  Alkaline Phos 38 - 126 U/L -  AST 15 - 41 U/L -  ALT 0 - 44 U/L -   CBC Latest  Ref Rng & Units 11/07/2017  WBC 3.8 -  10.6 K/uL 17.1(H)  Hemoglobin 13.0 - 18.0 g/dL 7.2(L)  Hematocrit 40.0 - 52.0 % 21.5(L)  Platelets 150 - 440 K/uL 64(L)   RADIOGRAPHIC STUDIES: I have personally reviewed the radiological images as listed and agreed with the findings in the report.  Dg Chest 2 View  Result Date: 11/04/2017 CLINICAL DATA:  Fever, cancer patient EXAM: CHEST - 2 VIEW COMPARISON:  CT 09/30/2016, radiograph 10/06/2017 FINDINGS: Right-sided central venous port tip over the proximal right atrium. Low lung volumes. No focal opacity or pleural effusion. Stable cardiomediastinal silhouette. No pneumothorax. Partially visualized IVC filter. IMPRESSION: No active cardiopulmonary disease. Electronically Signed   By: Donavan Foil M.D.   On: 11/04/2017 23:43   Ct Angio Ao+bifem W & Or Wo Contrast  Result Date: 11/05/2017 CLINICAL DATA:  27 year old male with sudden onset of a cold, painful left lower extremity. Past medical history of colon cancer and left lower extremity DVT. EXAM: CT ANGIOGRAPHY OF ABDOMINAL AORTA WITH ILIOFEMORAL RUNOFF TECHNIQUE: Multidetector CT imaging of the abdomen, pelvis and lower extremities was performed using the standard protocol during bolus administration of intravenous contrast. Multiplanar CT image reconstructions and MIPs were obtained to evaluate the vascular anatomy. CONTRAST:  149mL ISOVUE-370 IOPAMIDOL (ISOVUE-370) INJECTION 76% COMPARISON:  Prior CT scan of the abdomen and pelvis 10/10/2017 FINDINGS: VASCULAR Aorta: Normal caliber aorta without aneurysm, dissection, vasculitis or significant stenosis. Celiac: Patent without evidence of aneurysm, dissection, vasculitis or significant stenosis. SMA: Patent without evidence of aneurysm, dissection, vasculitis or significant stenosis. Renals: Both renal arteries are patent without evidence of aneurysm, dissection, vasculitis, fibromuscular dysplasia or significant stenosis. IMA: Patent without evidence of aneurysm, dissection, vasculitis or  significant stenosis. RIGHT Lower Extremity Inflow: Common, internal and external iliac arteries are patent without evidence of aneurysm, dissection, vasculitis or significant stenosis. Outflow: Common, superficial and profunda femoral arteries and the popliteal artery are patent without evidence of aneurysm, dissection, vasculitis or significant stenosis. Runoff: Patent three vessel runoff to the ankle. LEFT Lower Extremity Inflow: Common, internal and external iliac arteries are patent without evidence of aneurysm, dissection, vasculitis or significant stenosis. Outflow: Common, superficial and profunda femoral arteries and the popliteal artery are patent without evidence of aneurysm, dissection, vasculitis or significant stenosis. Runoff: Patent three vessel runoff to the ankle. Veins: Infrarenal IVC filter in place. No evidence of complication. The IVC and pelvic veins are all patent including the left external and common iliac venous stent. No evidence of left lower extremity DVT. Review of the MIP images confirms the above findings. NON-VASCULAR Lower chest: No acute abnormality. Hepatobiliary: No focal liver abnormality is seen. No gallstones, gallbladder wall thickening, or biliary dilatation. Pancreas: Unremarkable. No pancreatic ductal dilatation or surrounding inflammatory changes. Spleen: Normal in size without focal abnormality. Adrenals/Urinary Tract: Adrenal glands are unremarkable. Kidneys are normal, without renal calculi, focal lesion, or hydronephrosis. Bladder is unremarkable. Stomach/Bowel: Stomach is within normal limits. Appendix appears normal. No evidence of bowel wall thickening, distention, or inflammatory changes. Lymphatic: Stable right para-aortic lymph node measuring approximately 9 x 9 mm (image 84 series 4); left para-aortic lymph node measures slightly smaller at 8 mm compared to 12 mm previously. Left para-aortic lymph node more inferiorly is also decreasing in size presently at 15  mm compared to 17 mm previously. Left common iliac station node measures approximately 2.5 x 1.6 cm compared to 3.3 x 1.6 cm. Left external iliac station node measures approximately 3.3 x 2.6 cm compared to  4.0 x 3.7 cm. Reproductive: Surgical changes suggest left orchiectomy. Persistent postoperative changes present in the region the left inguinal canal. Other: No abdominal wall hernia or abnormality. No abdominopelvic ascites. Musculoskeletal: No acute or significant osseous findings. Significantly decreased left lower extremity swelling. There is some persistent skin thickening along the medial aspect of the left upper thigh. IMPRESSION: VASCULAR 1. No acute arterial or venous abnormality. 2. Patent left common and external iliac venous stents. 3. Well-positioned potentially retrievable infrarenal IVC filter. NON-VASCULAR 1. Interval response to therapy with decreasing left pelvic and retroperitoneal/para-aortic lymphadenopathy. 2. Postoperative changes in the left inguinal and left groin region as expected. 3. Significantly decreased left lower extremity edema. 4. There is some persistent skin thickening in the medial aspect of the left thigh in the region of the reported wounds. Electronically Signed   By: Jacqulynn Cadet M.D.   On: 11/05/2017 16:07   Ct Femur Left W Contrast  Result Date: 10/08/2017 CLINICAL DATA:  Left lower extremity cellulitis and possible necrotizing fasciitis. EXAM: CT OF THE LOWER RIGHT EXTREMITY WITH CONTRAST TECHNIQUE: Multidetector CT imaging of the lower right extremity was performed according to the standard protocol following intravenous contrast administration. COMPARISON:  None. CONTRAST:  171mL ISOVUE-300 IOPAMIDOL (ISOVUE-300) INJECTION 61% FINDINGS: Mild inflammatory stranding of the subcutaneous tissues of the left thigh is noted anteriorly and medially suggesting cellulitis. No definite abscess or fluid collection is noted. No definite bony abnormality or fracture is  noted involving the visualized bone. 3.7 cm left external iliac lymph node is noted consistent with metastatic disease. Probable filling defect is seen in the left common femoral and external iliac vein concerning for thrombus. Potentially this may extend into the left superficial femoral vein. IMPRESSION: Mild inflammatory stranding of the subcutaneous tissues of the left thigh is noted anteriorly and medially suggesting cellulitis or possibly edema. No abscess or fluid collection is noted. Filling defect is noted in the visualized portion of the left external iliac vein and common femoral vein suggesting deep venous thrombosis. Potentially this may extended to the left superficial femoral vein and further evaluation with Doppler ultrasound is recommended. These results will be called to the ordering clinician or representative by the Radiologist Assistant, and communication documented in the PACS or zVision Dashboard. 3.7 cm left external iliac lymph node is noted consistent with metastatic disease. Electronically Signed   By: Marijo Conception, M.D.   On: 10/08/2017 22:24   US Venous Img Lower Bilateral  Result Date: 11/05/2017 CLINICAL DATA:  Bilateral lower extremity edema EXAM: BILATERAL LOWER EXTREMITY VENOUS DOPPLER ULTRASOUND TECHNIQUE: Gray-scale sonography with compression, as well as color and duplex ultrasound, were performed to evaluate the deep venous system from the level of the common femoral vein through the popliteal and proximal calf veins. COMPARISON:  10/09/2017 FINDINGS: Normal compressibility of the common femoral, superficial femoral, and popliteal veins, as well as the proximal calf veins. No filling defects to suggest DVT on grayscale or color Doppler imaging. Doppler waveforms show normal direction of venous flow, normal respiratory phasicity and response to augmentation. IMPRESSION: No evidence of  lower extremity deep vein thrombosis. Electronically Signed   By: Lucrezia Europe M.D.   On:  11/05/2017 13:03   US Venous Img Lower Unilateral Left  Result Date: 10/09/2017 CLINICAL DATA:  Left lower extremity cellulitis with CT demonstrating possible DVT in the common femoral and external iliac veins. EXAM: LEFT LOWER EXTREMITY VENOUS DOPPLER ULTRASOUND TECHNIQUE: Gray-scale sonography with graded compression, as well as color Doppler  and duplex ultrasound were performed to evaluate the lower extremity deep venous systems from the level of the common femoral vein and including the common femoral, femoral, profunda femoral, popliteal and calf veins including the posterior tibial, peroneal and gastrocnemius veins when visible. The superficial great saphenous vein was also interrogated. Spectral Doppler was utilized to evaluate flow at rest and with distal augmentation maneuvers in the common femoral, femoral and popliteal veins. COMPARISON:  None. FINDINGS: Contralateral Common Femoral Vein: Respiratory phasicity is normal and symmetric with the symptomatic side. No evidence of thrombus. Normal compressibility. Common Femoral Vein: No evidence of thrombus. Normal compressibility, respiratory phasicity and response to augmentation. Saphenofemoral Junction: No evidence of thrombus. Normal compressibility and flow on color Doppler imaging. Profunda Femoral Vein: No evidence of thrombus. Normal compressibility and flow on color Doppler imaging. Femoral Vein: No evidence of thrombus. Normal compressibility, respiratory phasicity and response to augmentation. Popliteal Vein: No evidence of thrombus. Normal compressibility, respiratory phasicity and response to augmentation. Calf Veins: No evidence of thrombus. Normal compressibility and flow on color Doppler imaging. Superficial Great Saphenous Vein: No evidence of thrombus. Normal compressibility. Venous Reflux:  None. Other Findings: No evidence of superficial thrombophlebitis or abnormal fluid collection. IMPRESSION: No evidence of left lower extremity deep  venous thrombosis. CT findings are felt to likely relate to streaming of contrast during early opacification of the venous system. If there is any progressive edema of the left lower extremity, follow-up duplex ultrasound may be helpful. Electronically Signed   By: Aletta Edouard M.D.   On: 10/09/2017 15:27   Dg Foot Complete Right  Result Date: 11/05/2017 CLINICAL DATA:  Stepped on a nail 3 days ago. Evaluate for osteomyelitis. EXAM: RIGHT FOOT COMPLETE - 3+ VIEW COMPARISON:  None. FINDINGS: Faint radiopaque densities about the skin and soft tissues subjacent to the first-second interspace may be site of puncture wound. No soft tissue air. No fracture or dislocation. No osseous erosions or bony destructive change. No periosteal reaction. Diffuse soft tissue edema. Screws in the distal tibia partially included. IMPRESSION: Scattered faint radiopaque densities about the skin and soft tissues subjacent to the first-interspace may be site of puncture wound. No radiographic findings of osteomyelitis. Electronically Signed   By: Jeb Levering M.D.   On: 11/05/2017 01:04   Ct Extremity Lower Left W Contrast  Addendum Date: 10/13/2017   ADDENDUM REPORT: 10/13/2017 08:09 ADDENDUM: Prior outside CT examination from 09/12/2017 from [facility] has been obtained and is compared to this study. The abdominal/pelvic lymphadenopathy is vastly improved when compared to the study from 09/12/2017. Interaortocaval adenopathy on the prior study measured 2.6 x 2.0 cm and now measures 0.8 x 0.7 cm. Left common iliac lymphadenopathy on the prior study measured 4.6 x 3.9 cm and now measures 3.2 x 1.6 cm. The necrotic adenopathy in the left external iliac region measured 5.6 x 4.1 cm and now measures 4.0 x 3.7 cm on this study. The deep venous thrombosis appears unchanged. Electronically Signed   By: Marijo Sanes M.D.   On: 10/13/2017 08:09   Result Date: 10/13/2017 CLINICAL DATA:  History of testicular cancer with  LEFT LOWER EXTREMITY PAIN AND SWELLING. EXAM: CT ABDOMEN/PELVIS WITH CONTRAST. CT OF THE LOWER LEFT EXTREMITY WITH CONTRAST TECHNIQUE: Multidetector CT imaging of the abdomen and pelvis was performed using the standard protocol following bolus administration of intravenous contrast. CT examination of the left lower extremity was also performed. CONTRAST:  126mL ISOVUE-300 IOPAMIDOL (ISOVUE-300) INJECTION 61% COMPARISON:  Prior CT  scan from 2 days ago. FINDINGS: Lower chest: The lung bases are clear of acute process. Minimal dependent bibasilar atelectasis. No pleural effusion or pulmonary lesions. The heart is normal in size. No pericardial effusion. The distal esophagus and aorta are unremarkable. Hepatobiliary: No focal hepatic lesions or intrahepatic biliary dilatation. The gallbladder is mildly contracted. No common bile duct dilatation. Pancreas: No mass, inflammation or ductal dilatation. Spleen: Normal size.  No focal lesions. Adrenals/Urinary Tract: The adrenal glands and kidneys are unremarkable. No renal, ureteral or bladder calculi or mass. Stomach/Bowel: The stomach, duodenum, small bowel and colon are unremarkable. No acute inflammatory changes, mass lesions or obstructive findings. The terminal ileum and appendix are normal. Vascular/Lymphatic: The aorta and branch vessels are normal. The IVC is patent. There is retroperitoneal lymphadenopathy mainly in the left para-aortic region. 11.5 mm lymph node noted on image number 54. 17.5 mm left para-aortic lymph node on image number 66. 16 mm left common iliac lymph node on image number 74. Again demonstrated is a large necrotic appearing nodal mass in the medial left external iliac region. This measures a maximum of 3.7 cm and adjacent to this the left external iliac vein is thrombosed. Thrombus extends down into the common femoral vein to just above the profundus femoral vein. The right-sided venous structures are patent. No deep venous thrombosis is  identified in the left thigh. The femoral and popliteal veins are patent. Reproductive: The prostate gland and seminal vesicles are unremarkable. Other: No intrapelvic or intra-abdominal fluid collections. Musculoskeletal: No significant bony findings. LEFT LOWER EXTREMITY: Extensive subcutaneous soft tissue swelling/edema/fluid involving the entire imaged left thigh with associated skin thickening and areas of skin blistering. This could be due to the deep venous thrombosis and or cellulitis. Is also diffuse edema in the muscular compartments of the thigh consistent with myofasciitis. No discrete abscess or findings to suggest pyomyositis. No knee or hip joint effusions to suggest septic arthritis. Enlarged left axillary lymph nodes likely inflammatory. IMPRESSION: 1. Left-sided retroperitoneal lymphadenopathy and left pelvic lymphadenopathy likely metastatic testicular cancer. The large necrotic appearing left medial external iliac lymph node is likely compressing the left external iliac vein and causing deep venous thrombosis extending down into the left groin. This does not extend down into the left thigh. 2. Marked and diffuse subcutaneous soft tissue swelling/edema/fluid along with skin thickening and skin blisters involving the left thigh. This could be due to the deep venous thrombosis or possibly could be secondary cellulitis and myofasciitis. No discrete drainable abscess is identified. 3. No definite CT findings for septic arthritis or osteomyelitis and no findings for metastatic disease involving the lung bases or bony structures. Electronically Signed: By: Marijo Sanes M.D. On: 10/10/2017 15:28   Ct Venogram Abd/pel  Addendum Date: 10/13/2017   ADDENDUM REPORT: 10/13/2017 08:09 ADDENDUM: Prior outside CT examination from 09/12/2017 from [facility] has been obtained and is compared to this study. The abdominal/pelvic lymphadenopathy is vastly improved when compared to the study from  09/12/2017. Interaortocaval adenopathy on the prior study measured 2.6 x 2.0 cm and now measures 0.8 x 0.7 cm. Left common iliac lymphadenopathy on the prior study measured 4.6 x 3.9 cm and now measures 3.2 x 1.6 cm. The necrotic adenopathy in the left external iliac region measured 5.6 x 4.1 cm and now measures 4.0 x 3.7 cm on this study. The deep venous thrombosis appears unchanged. Electronically Signed   By: Marijo Sanes M.D.   On: 10/13/2017 08:09   Result Date: 10/13/2017  CLINICAL DATA:  History of testicular cancer with LEFT LOWER EXTREMITY PAIN AND SWELLING. EXAM: CT ABDOMEN/PELVIS WITH CONTRAST. CT OF THE LOWER LEFT EXTREMITY WITH CONTRAST TECHNIQUE: Multidetector CT imaging of the abdomen and pelvis was performed using the standard protocol following bolus administration of intravenous contrast. CT examination of the left lower extremity was also performed. CONTRAST:  154mL ISOVUE-300 IOPAMIDOL (ISOVUE-300) INJECTION 61% COMPARISON:  Prior CT scan from 2 days ago. FINDINGS: Lower chest: The lung bases are clear of acute process. Minimal dependent bibasilar atelectasis. No pleural effusion or pulmonary lesions. The heart is normal in size. No pericardial effusion. The distal esophagus and aorta are unremarkable. Hepatobiliary: No focal hepatic lesions or intrahepatic biliary dilatation. The gallbladder is mildly contracted. No common bile duct dilatation. Pancreas: No mass, inflammation or ductal dilatation. Spleen: Normal size.  No focal lesions. Adrenals/Urinary Tract: The adrenal glands and kidneys are unremarkable. No renal, ureteral or bladder calculi or mass. Stomach/Bowel: The stomach, duodenum, small bowel and colon are unremarkable. No acute inflammatory changes, mass lesions or obstructive findings. The terminal ileum and appendix are normal. Vascular/Lymphatic: The aorta and branch vessels are normal. The IVC is patent. There is retroperitoneal lymphadenopathy mainly in the left para-aortic  region. 11.5 mm lymph node noted on image number 54. 17.5 mm left para-aortic lymph node on image number 66. 16 mm left common iliac lymph node on image number 74. Again demonstrated is a large necrotic appearing nodal mass in the medial left external iliac region. This measures a maximum of 3.7 cm and adjacent to this the left external iliac vein is thrombosed. Thrombus extends down into the common femoral vein to just above the profundus femoral vein. The right-sided venous structures are patent. No deep venous thrombosis is identified in the left thigh. The femoral and popliteal veins are patent. Reproductive: The prostate gland and seminal vesicles are unremarkable. Other: No intrapelvic or intra-abdominal fluid collections. Musculoskeletal: No significant bony findings. LEFT LOWER EXTREMITY: Extensive subcutaneous soft tissue swelling/edema/fluid involving the entire imaged left thigh with associated skin thickening and areas of skin blistering. This could be due to the deep venous thrombosis and or cellulitis. Is also diffuse edema in the muscular compartments of the thigh consistent with myofasciitis. No discrete abscess or findings to suggest pyomyositis. No knee or hip joint effusions to suggest septic arthritis. Enlarged left axillary lymph nodes likely inflammatory. IMPRESSION: 1. Left-sided retroperitoneal lymphadenopathy and left pelvic lymphadenopathy likely metastatic testicular cancer. The large necrotic appearing left medial external iliac lymph node is likely compressing the left external iliac vein and causing deep venous thrombosis extending down into the left groin. This does not extend down into the left thigh. 2. Marked and diffuse subcutaneous soft tissue swelling/edema/fluid along with skin thickening and skin blisters involving the left thigh. This could be due to the deep venous thrombosis or possibly could be secondary cellulitis and myofasciitis. No discrete drainable abscess is  identified. 3. No definite CT findings for septic arthritis or osteomyelitis and no findings for metastatic disease involving the lung bases or bony structures. Electronically Signed: By: Marijo Sanes M.D. On: 10/10/2017 15:28   Dg Femur Min 2 Views Left  Result Date: 10/08/2017 CLINICAL DATA:  Fever with left leg swelling. EXAM: LEFT FEMUR 2 VIEWS COMPARISON:  None. FINDINGS: There is no evidence of fracture or other focal bone lesions. Diffuse soft tissue swelling. No subcutaneous emphysema. IMPRESSION: Diffuse soft tissue swelling.  No subcutaneous emphysema. Electronically Signed   By: Orville Govern.D.  On: 10/08/2017 17:11   Ct Outside Films Chest  Result Date: 10/12/2017 This examination belongs to an outside facility and is stored here for comparison purposes only.  Contact the originating outside institution for any associated report or interpretation.  Mr Outside Films Head/face  Result Date: 10/12/2017 This examination belongs to an outside facility and is stored here for comparison purposes only.  Contact the originating outside institution for any associated report or interpretation.  Ct Outside Films Body  Result Date: 10/12/2017 This examination belongs to an outside facility and is stored here for comparison purposes only.  Contact the originating outside institution for any associated report or interpretation.  Ct Outside Films Body  Result Date: 10/12/2017 This examination belongs to an outside facility and is stored here for comparison purposes only.  Contact the originating outside institution for any associated report or interpretation.   Assessment and plan-  # left lower extremity cellulitis, continue IV antibiotics. # Recent left lower extremity DVT: s/p IVC filter and thrombectomy and thrombolysis.  platelet count improved to 64,000. Can start heparin drip without bolus.   # Thrombocytopenia, improved to 64,000. No bleeding.   # Testicular cancer,  outpatient follow up with Dr.Corcoran.  # Bilateral lower extremity lesions, plan skin biopsy next week.   Thank you for allowing me to participate in the care of this patient.   Earlie Server, MD, PhD Hematology Oncology [facility] Pager- [phone] 11/07/2017

## 2017-11-07 NOTE — Progress Notes (Signed)
ANTICOAGULATION CONSULT NOTE  Pharmacy Consult for heparin drip Indication: DVT  No Known Allergies  Patient Measurements: Height: 6\' 3"  (190.5 cm) Weight: 280 lb 3.2 oz (127.1 kg) IBW/kg (Calculated) : 84.5 Heparin Dosing Weight: 112 kg  Vital Signs: Temp: 97.6 F (36.4 C) (08/25 1946) Temp Source: Oral (08/25 1946) BP: 120/84 (08/25 1946) Pulse Rate: 80 (08/25 1946)  Labs: Recent Labs    11/04/17 2340 11/05/17 0943 11/05/17 1055 11/06/17 0531 11/07/17 0541 11/07/17 1226 11/07/17 2053  HGB 7.6* 7.0*  --  7.0* 7.2*  --   --   HCT 22.2* 20.5*  --  20.4* 21.5*  --   --   PLT 41* 39*  --  47* 64*  --   --   APTT  --   --  45*  --   --  37*  --   LABPROT  --   --  14.9 19.4*  --  14.4  --   INR  --   --  1.18 1.65  --  1.13  --   HEPARINUNFRC  --   --   --   --   --  0.11* 0.20*  CREATININE 0.88 0.85  --  0.80  --   --   --     Estimated Creatinine Clearance: 195.6 mL/min (by C-G formula based on SCr of 0.8 mg/dL).   Medical History: Past Medical History:  Diagnosis Date  . Asthma    AS A CHILD-NO INHALERS  . Cancer ([place])    testicular cancer 11/2016 per pt   . DVT (deep venous thrombosis) ([place])   . GERD (gastroesophageal reflux disease)    TUMS PRN    Medications:  PTA rivaroxaban 20mg  daily - last dose was 8/23 at 1600.  Assessment: 27 yo male admitted with history of testicular cancer (on chemotherapy) and DVT (IVC filter) for cellulitist found to have DVT. Patient takes rivaroxaban at home but it has been > 24 hours since last dose. Rivaroxaban placed on hold d/t low Plt 41 > 39 > 47 > 64 and low Hgb 7.2. Pharmacy has been consulted to dose heparin. Oncology also on board. As patient is thrombocytopenic and anemic, will not give bolus and will titrate up heparin drip based on levels. Ordered baseline APTT/PT INR/ and heparin level.   Heparin infusing at 1750 units/hr, 8/25 21:00 Heparin level resulted at 0.20  Goal of Therapy:  Heparin level 0.3-0.7  units/ml Monitor platelets by anticoagulation protocol: Yes   Plan:  Will increase Heparin drip to 1950 units/hr. Next Heparin level in 6 hours. Daily CBC while on Heparin infusion.  Paulina Fusi, PharmD, BCPS 11/07/2017 9:36 PM

## 2017-11-07 NOTE — Progress Notes (Signed)
[place] for heparin drip Indication: DVT  No Known Allergies  Patient Measurements: Height: 6\' 3"  (190.5 cm) Weight: 280 lb 3.2 oz (127.1 kg) IBW/kg (Calculated) : 84.5 Heparin Dosing Weight: 112 kg  Vital Signs: Temp: 97.8 F (36.6 C) (08/25 0930) Temp Source: Oral (08/25 0930) BP: 110/69 (08/25 0930) Pulse Rate: 86 (08/25 0930)  Labs: Recent Labs    11/04/17 2340 11/05/17 0943 11/05/17 1055 11/06/17 0531 11/07/17 0541  HGB 7.6* 7.0*  --  7.0* 7.2*  HCT 22.2* 20.5*  --  20.4* 21.5*  PLT 41* 39*  --  47* 64*  APTT  --   --  45*  --   --   LABPROT  --   --  14.9 19.4*  --   INR  --   --  1.18 1.65  --   CREATININE 0.88 0.85  --  0.80  --     Estimated Creatinine Clearance: 195.6 mL/min (by C-G formula based on SCr of 0.8 mg/dL).   Medical History: Past Medical History:  Diagnosis Date  . Asthma    AS A CHILD-NO INHALERS  . Cancer ([place])    testicular cancer 11/2016 per pt   . DVT (deep venous thrombosis) ([place])   . GERD (gastroesophageal reflux disease)    TUMS PRN    Medications:  PTA rivaroxaban 20mg  daily - last dose was 8/23 at 1600.  Assessment: 27 yo male admitted with history of testicular cancer (on chemotherapy) and DVT (IVC filter) for cellulitist found to have DVT. Patient takes rivaroxaban at home but it has been > 24 hours since last dose. Rivaroxaban placed on hold d/t low Plt 41 > 39 > 47 > 64 and low Hgb 7.2. Pharmacy has been consulted to dose heparin. Oncology also on board. As patient is thrombocytopenic and anemic, will not give bolus and will titrate up heparin drip based on levels. Ordered baseline APTT/PT INR/ and heparin level.   Goal of Therapy:  Heparin level 0.3-0.7 units/ml Monitor platelets by anticoagulation protocol: Yes   Plan:  Start heparin infusion at 1750 units/hr Check anti-Xa level in 6 hours and daily while on heparin Continue to monitor H&H and platelets  Lendon Ka,  PharmD 11/07/2017,12:31 PM

## 2017-11-07 NOTE — Progress Notes (Signed)
[facility] [place] NAME: Edgar [name]    MR#:  [id number]  DATE OF BIRTH:  [birth year]  SUBJECTIVE:  CHIEF COMPLAINT:  No chief complaint on file.  Continues to have pain left thigh.  Afebrile. No bleeding.  REVIEW OF SYSTEMS:    Review of Systems  Constitutional: Positive for malaise/fatigue. Negative for chills and fever.  HENT: Negative for sore throat.   Eyes: Negative for blurred vision, double vision and pain.  Respiratory: Negative for cough, hemoptysis, shortness of breath and wheezing.   Cardiovascular: Negative for chest pain, palpitations, orthopnea and leg swelling.  Gastrointestinal: Negative for abdominal pain, constipation, diarrhea, heartburn, nausea and vomiting.  Genitourinary: Negative for dysuria and hematuria.  Musculoskeletal: Negative for back pain and joint pain.  Skin: Negative for rash.  Neurological: Negative for sensory change, speech change, focal weakness and headaches.  Endo/Heme/Allergies: Does not bruise/bleed easily.  Psychiatric/Behavioral: Negative for depression. The patient is not nervous/anxious.     DRUG ALLERGIES:  No Known Allergies  VITALS:  Blood pressure 110/69, pulse 86, temperature 97.8 F (36.6 C), temperature source Oral, resp. rate 16, height 6\' 3"  (1.905 m), weight 127.1 kg, SpO2 99 %.  PHYSICAL EXAMINATION:   Physical Exam  GENERAL:  27 y.o.-year-old patient lying in the bed with no acute distress.  EYES: Pupils equal, round, reactive to light and accommodation. No scleral icterus. Extraocular muscles intact.  HEENT: Head atraumatic, normocephalic. Oropharynx and nasopharynx clear.  NECK:  Supple, no jugular venous distention. No thyroid enlargement, no tenderness.  LUNGS: Normal breath sounds bilaterally, no wheezing, rales, rhonchi. No use of accessory muscles of respiration.  CARDIOVASCULAR: S1, S2 normal. No murmurs, rubs, or gallops.  ABDOMEN: Soft, nontender,  nondistended. Bowel sounds present. No organomegaly or mass.  EXTREMITIES: No cyanosis, clubbing or edema b/l.    NEUROLOGIC: Cranial nerves II through XII are intact. No focal Motor or sensory deficits b/l.   PSYCHIATRIC: The patient is alert and oriented x 3.  SKIN: Left thigh large healing ulcers with no discharge. Left calf lesions. Right thigh indurated areas  LABORATORY PANEL:   CBC Recent Labs  Lab 11/07/17 0541  WBC 17.1*  HGB 7.2*  HCT 21.5*  PLT 64*   ------------------------------------------------------------------------------------------------------------------ Chemistries  Recent Labs  Lab 11/05/17 0943 11/06/17 0531  NA 132* 137  K 3.9 3.1*  CL 101 104  CO2 24 28  GLUCOSE 107* 129*  BUN 14 11  CREATININE 0.85 0.80  CALCIUM 8.2* 7.9*  MG 1.3* 1.6*  AST 14*  --   ALT 10  --   ALKPHOS 51  --   BILITOT 0.5  --    ------------------------------------------------------------------------------------------------------------------  Cardiac Enzymes No results for input(s): TROPONINI in the last 168 hours. ------------------------------------------------------------------------------------------------------------------  RADIOLOGY:  Ct Angio Ao+bifem W & Or Wo Contrast  Result Date: 11/05/2017 CLINICAL DATA:  27 year old male with sudden onset of a cold, painful left lower extremity. Past medical history of colon cancer and left lower extremity DVT. EXAM: CT ANGIOGRAPHY OF ABDOMINAL AORTA WITH ILIOFEMORAL RUNOFF TECHNIQUE: Multidetector CT imaging of the abdomen, pelvis and lower extremities was performed using the standard protocol during bolus administration of intravenous contrast. Multiplanar CT image reconstructions and MIPs were obtained to evaluate the vascular anatomy. CONTRAST:  119mL ISOVUE-370 IOPAMIDOL (ISOVUE-370) INJECTION 76% COMPARISON:  Prior CT scan of the abdomen and pelvis 10/10/2017 FINDINGS: VASCULAR Aorta: Normal caliber aorta without  aneurysm, dissection, vasculitis or significant stenosis. Celiac: Patent without evidence of  aneurysm, dissection, vasculitis or significant stenosis. SMA: Patent without evidence of aneurysm, dissection, vasculitis or significant stenosis. Renals: Both renal arteries are patent without evidence of aneurysm, dissection, vasculitis, fibromuscular dysplasia or significant stenosis. IMA: Patent without evidence of aneurysm, dissection, vasculitis or significant stenosis. RIGHT Lower Extremity Inflow: Common, internal and external iliac arteries are patent without evidence of aneurysm, dissection, vasculitis or significant stenosis. Outflow: Common, superficial and profunda femoral arteries and the popliteal artery are patent without evidence of aneurysm, dissection, vasculitis or significant stenosis. Runoff: Patent three vessel runoff to the ankle. LEFT Lower Extremity Inflow: Common, internal and external iliac arteries are patent without evidence of aneurysm, dissection, vasculitis or significant stenosis. Outflow: Common, superficial and profunda femoral arteries and the popliteal artery are patent without evidence of aneurysm, dissection, vasculitis or significant stenosis. Runoff: Patent three vessel runoff to the ankle. Veins: Infrarenal IVC filter in place. No evidence of complication. The IVC and pelvic veins are all patent including the left external and common iliac venous stent. No evidence of left lower extremity DVT. Review of the MIP images confirms the above findings. NON-VASCULAR Lower chest: No acute abnormality. Hepatobiliary: No focal liver abnormality is seen. No gallstones, gallbladder wall thickening, or biliary dilatation. Pancreas: Unremarkable. No pancreatic ductal dilatation or surrounding inflammatory changes. Spleen: Normal in size without focal abnormality. Adrenals/Urinary Tract: Adrenal glands are unremarkable. Kidneys are normal, without renal calculi, focal lesion, or hydronephrosis.  Bladder is unremarkable. Stomach/Bowel: Stomach is within normal limits. Appendix appears normal. No evidence of bowel wall thickening, distention, or inflammatory changes. Lymphatic: Stable right para-aortic lymph node measuring approximately 9 x 9 mm (image 84 series 4); left para-aortic lymph node measures slightly smaller at 8 mm compared to 12 mm previously. Left para-aortic lymph node more inferiorly is also decreasing in size presently at 15 mm compared to 17 mm previously. Left common iliac station node measures approximately 2.5 x 1.6 cm compared to 3.3 x 1.6 cm. Left external iliac station node measures approximately 3.3 x 2.6 cm compared to 4.0 x 3.7 cm. Reproductive: Surgical changes suggest left orchiectomy. Persistent postoperative changes present in the region the left inguinal canal. Other: No abdominal wall hernia or abnormality. No abdominopelvic ascites. Musculoskeletal: No acute or significant osseous findings. Significantly decreased left lower extremity swelling. There is some persistent skin thickening along the medial aspect of the left upper thigh. IMPRESSION: VASCULAR 1. No acute arterial or venous abnormality. 2. Patent left common and external iliac venous stents. 3. Well-positioned potentially retrievable infrarenal IVC filter. NON-VASCULAR 1. Interval response to therapy with decreasing left pelvic and retroperitoneal/para-aortic lymphadenopathy. 2. Postoperative changes in the left inguinal and left groin region as expected. 3. Significantly decreased left lower extremity edema. 4. There is some persistent skin thickening in the medial aspect of the left thigh in the region of the reported wounds. Electronically Signed   By: Jacqulynn Cadet M.D.   On: 11/05/2017 16:07   US Venous Img Lower Bilateral  Result Date: 11/05/2017 CLINICAL DATA:  Bilateral lower extremity edema EXAM: BILATERAL LOWER EXTREMITY VENOUS DOPPLER ULTRASOUND TECHNIQUE: Gray-scale sonography with compression,  as well as color and duplex ultrasound, were performed to evaluate the deep venous system from the level of the common femoral vein through the popliteal and proximal calf veins. COMPARISON:  10/09/2017 FINDINGS: Normal compressibility of the common femoral, superficial femoral, and popliteal veins, as well as the proximal calf veins. No filling defects to suggest DVT on grayscale or color Doppler imaging. Doppler waveforms show normal  direction of venous flow, normal respiratory phasicity and response to augmentation. IMPRESSION: No evidence of  lower extremity deep vein thrombosis. Electronically Signed   By: Lucrezia Europe M.D.   On: 11/05/2017 13:03     ASSESSMENT AND PLAN:   *  Left lower extremity cellulitis On IV antibiotics.  Appreciate ID input.  Cultures pending.  * Testicular cancer on chemotherapy Follows with Dr. Mike Gip  *Chronic left thigh skin lesions Healing  Skin biopsy scheduled for Tuesday  *Pancytopenia secondary to chemotherapy  *Left lower extremity DVT. Xarelto held due to thrombocytopenia. Oncology consulted.   Will start heparin drip today now that platelets are 64,000  All the records are reviewed and case discussed with Care Management/Social Worker Management plans discussed with the patient, family and they are in agreement.  CODE STATUS: FULL CODE  DVT Prophylaxis: SCDs  TOTAL TIME TAKING CARE OF THIS PATIENT: 35 minutes.   POSSIBLE D/C IN 2-3 DAYS, DEPENDING ON CLINICAL CONDITION.  Edgar [name] M.D on 11/07/2017 at 12:01 PM  Between 7am to 6pm - Pager - [phone]  After 6pm go to [email or web address] - password [email or web address] [facility]  Office  [phone]  CC: Primary care physician; Patient, No Pcp Per  Note: This dictation was prepared with Dragon dictation along with smaller phrase technology. Any transcriptional errors that result from this process are unintentional.

## 2017-11-08 DIAGNOSIS — S91331D Puncture wound without foreign body, right foot, subsequent encounter: Secondary | ICD-10-CM

## 2017-11-08 DIAGNOSIS — W228XXD Striking against or struck by other objects, subsequent encounter: Secondary | ICD-10-CM

## 2017-11-08 DIAGNOSIS — E876 Hypokalemia: Secondary | ICD-10-CM

## 2017-11-08 DIAGNOSIS — C6290 Malignant neoplasm of unspecified testis, unspecified whether descended or undescended: Secondary | ICD-10-CM

## 2017-11-08 DIAGNOSIS — B001 Herpesviral vesicular dermatitis: Secondary | ICD-10-CM

## 2017-11-08 DIAGNOSIS — X58XXXA Exposure to other specified factors, initial encounter: Secondary | ICD-10-CM

## 2017-11-08 DIAGNOSIS — S91341A Puncture wound with foreign body, right foot, initial encounter: Secondary | ICD-10-CM

## 2017-11-08 DIAGNOSIS — D649 Anemia, unspecified: Secondary | ICD-10-CM

## 2017-11-08 DIAGNOSIS — Z9079 Acquired absence of other genital organ(s): Secondary | ICD-10-CM

## 2017-11-08 DIAGNOSIS — Z86718 Personal history of other venous thrombosis and embolism: Secondary | ICD-10-CM

## 2017-11-08 DIAGNOSIS — T451X5D Adverse effect of antineoplastic and immunosuppressive drugs, subsequent encounter: Secondary | ICD-10-CM

## 2017-11-08 DIAGNOSIS — G893 Neoplasm related pain (acute) (chronic): Secondary | ICD-10-CM

## 2017-11-08 DIAGNOSIS — Z9221 Personal history of antineoplastic chemotherapy: Secondary | ICD-10-CM

## 2017-11-08 DIAGNOSIS — L03116 Cellulitis of left lower limb: Secondary | ICD-10-CM

## 2017-11-08 DIAGNOSIS — Z95820 Peripheral vascular angioplasty status with implants and grafts: Secondary | ICD-10-CM

## 2017-11-08 DIAGNOSIS — D6959 Other secondary thrombocytopenia: Secondary | ICD-10-CM

## 2017-11-08 DIAGNOSIS — Z9889 Other specified postprocedural states: Secondary | ICD-10-CM

## 2017-11-08 DIAGNOSIS — R509 Fever, unspecified: Secondary | ICD-10-CM

## 2017-11-08 DIAGNOSIS — Z8547 Personal history of malignant neoplasm of testis: Secondary | ICD-10-CM

## 2017-11-08 DIAGNOSIS — T451X5A Adverse effect of antineoplastic and immunosuppressive drugs, initial encounter: Secondary | ICD-10-CM

## 2017-11-08 DIAGNOSIS — L988 Other specified disorders of the skin and subcutaneous tissue: Secondary | ICD-10-CM

## 2017-11-08 DIAGNOSIS — Y939 Activity, unspecified: Secondary | ICD-10-CM

## 2017-11-08 DIAGNOSIS — D6481 Anemia due to antineoplastic chemotherapy: Secondary | ICD-10-CM

## 2017-11-08 DIAGNOSIS — I878 Other specified disorders of veins: Secondary | ICD-10-CM

## 2017-11-08 LAB — CBC WITH DIFFERENTIAL/PLATELET
Basophils Absolute: 0 10*3/uL (ref 0–0.1)
Basophils Relative: 0 %
Eosinophils Absolute: 0 10*3/uL (ref 0–0.7)
Eosinophils Relative: 0 %
HCT: 21.4 % — ABNORMAL LOW (ref 40.0–52.0)
Hemoglobin: 7.1 g/dL — ABNORMAL LOW (ref 13.0–18.0)
Lymphocytes Relative: 4 %
Lymphs Abs: 0.6 10*3/uL — ABNORMAL LOW (ref 1.0–3.6)
MCH: 30.3 pg (ref 26.0–34.0)
MCHC: 33.1 g/dL (ref 32.0–36.0)
MCV: 91.3 fL (ref 80.0–100.0)
Monocytes Absolute: 0.7 10*3/uL (ref 0.2–1.0)
Monocytes Relative: 5 %
Neutro Abs: 13.5 10*3/uL — ABNORMAL HIGH (ref 1.4–6.5)
Neutrophils Relative %: 91 %
Platelets: 94 10*3/uL — ABNORMAL LOW (ref 150–440)
RBC: 2.35 MIL/uL — ABNORMAL LOW (ref 4.40–5.90)
RDW: 16 % — ABNORMAL HIGH (ref 11.5–14.5)
WBC: 14.8 10*3/uL — ABNORMAL HIGH (ref 3.8–10.6)

## 2017-11-08 LAB — HEPARIN LEVEL (UNFRACTIONATED)
HEPARIN UNFRACTIONATED: 0.28 [IU]/mL — AB (ref 0.30–0.70)
HEPARIN UNFRACTIONATED: 0.41 [IU]/mL (ref 0.30–0.70)
HEPARIN UNFRACTIONATED: 0.34 [IU]/mL (ref 0.30–0.70)

## 2017-11-08 MED ORDER — HEPARIN BOLUS VIA INFUSION
1700.0000 [IU] | Freq: Once | INTRAVENOUS | Status: AC
  Administered 2017-11-08: 1700 [IU] via INTRAVENOUS
  Filled 2017-11-08: qty 1700

## 2017-11-08 MED ORDER — ACYCLOVIR 200 MG PO CAPS
400.0000 mg | ORAL_CAPSULE | Freq: Three times a day (TID) | ORAL | Status: DC
  Administered 2017-11-08 – 2017-11-09 (×4): 400 mg via ORAL
  Filled 2017-11-08 (×5): qty 2

## 2017-11-08 MED ORDER — POTASSIUM CHLORIDE CRYS ER 20 MEQ PO TBCR
20.0000 meq | EXTENDED_RELEASE_TABLET | Freq: Two times a day (BID) | ORAL | Status: DC
  Administered 2017-11-08 – 2017-11-09 (×3): 20 meq via ORAL
  Filled 2017-11-08 (×3): qty 1

## 2017-11-08 NOTE — Progress Notes (Signed)
HPI: Edgar [name] is a 27 y.o. male with h/o testicular cancer diagnosed in sept  2017 s/p left orchidectomys/p 3 cycles of BEP chemo , recurrence in June 2019, started TIP chemo, July had DVT of the left leg with severe venous edema , blistering lesions on his left leg,and underwent percutaneous transluminal angioplasty and mechanical thrombectomy of theleft common femoral, external iliac and common iliac veins and  stent placement of the left common iliac vein and left external iliac vein. Left leg cellulitis and lesions on his left thigh is  Was recently in [facility] last week to get chemo cycle 3 Is now admitted with fever of 1 day duration and painful swelling left leg X 1 day Pt says after his discharge from hospital on 8/13 was doing well and was relaxing at home. He saw his oncologist as Op on 8/16. Got transfusion on 11/01/17. Last night he had a fever and came to the ED and blood cultures were drawn and he was given a dose of IV and was discharged to follow up with Dr.C today. He went to her clinic this morning and was admitted because of swelling of left leg and similar lesions appearing on his left calf area   Medical history  Diagnosed testicular cancer  in sept  2017 s/p left orchidectomys/p 3 cycles of BEP chemoPt on  June 28th  went to [facility] with swelling of left leg and workup  revealed retroperitoneal adenopathy causing compression of the left iliac vein and associated thrombus He got IV heparin and discharged on xarelto. He started first cycle of TIP (paclitaxel/ifosfamide/cisplatin)  on 09/21/17- a 5 day cycle every 21 days. He took Decadron x 3 days after chemo. On day 7 (09/27/2017), he began Levaquin x 7 days. He received Udencya on 09/27/2017. He was seen as follow up on 7/22 at [facility] and the note says he had marked swelling of the left leg with pitting edema. On 7/24 patient presented to [facility] ED with fever of 102 and pain left leg, along with worsening  swelling. He was diagnosed with cellulitis and sent home on clindamycin. Blood culture was negative. He saw Dr.Corcoran ( onc) on July 26th and she noted blistering lesions on his left leg and admitted him to the hopsital for Iv antibiotics  He received IV vanco and cefepime. On 10/13/17 he underwent the following by Vascular surgery 1. Ultrasound guidance for vascular access to therightcommon femoral vein and leftpopliteal vein 2. Catheter placement into the inferior vena cava for placement of IVC filter 3. Inferior venacavogram 4. Placement of a [place] filter infrarenal 5. Introduction catheter into venous system second order catheter placement leftpopliteal approach 6.Percutaneous transluminal angioplasty and stent placement of the left common iliac vein and left external iliac vein 7. Percutaneous transluminal angioplasty of the left common femoral vein 8.Mechanical thrombectomy of theleft common femoral, external iliac and common iliac veins with the penumbra cat 8 device 9.Excisional debridement of necrotic skin and dermis multiple blistered ulcerations left thigh  He improved significantly after that and was sent home on 10/16/17 on doxy, levaquin and flagyl.and xarelto. The wound on the leg was being treated with silversulphadiazine and xeroform. He was admitted to [facility] 8/8-8/13 for 3rd round of chemo and I had seen him on 8/13 and antibiotics were discontinued as there was no infection in the leg. That time the skin lesions on his thigh looked like healing superficial burns- see below 10/26/17    subjective Feeling okay No  fever   OBJECTIVE: BP 116/82 (BP Location: Left Arm)   Pulse 76   Temp 98 F (36.7 C) (Oral)   Resp 16   Ht 6\' 3"  (1.905 m)   Wt 127.1 kg   SpO2 97%   BMI 35.02 kg/m   Physical Exam  Constitutional:  No distress.  HENT:  Head: Normocephalic.  Mouth/Throat: Oropharynx is clear and moist. upper lip herpetic  lesion Eyes: Pupils are equal, round, and reactive to light.  Neck: Neck supple.  Cardiovascular: Intact distal pulses.  No murmur heard. S1s2,  Pulmonary/Chest: CTA  Abdominal: Soft. There is no tenderness.  Musculoskeletal: He exhibits edema (left leg).  Left leg is less swollen Erythematous left calf with exquisite tenderness developing circular lesions calf Also has circular erythema over the rt thigh and left thigh which are not tender   Neurological: He is alert and oriented to person, place, and time. No cranial nerve deficit.  Skin: Skin is warm.  Psychiatric: He has a normal mood and affect.   skin Healing circular lesions left thigh with no erythema  Or induration   rt thigh lateral aspect- 5 cm circular erythema with central nodularity and tenderness     Rt foot -site of nail prick - no erythema or tenderness     Left thigh- above knee- new area of circualr erythema with central nodularity    Back of left calf has erythematous area with central nodualrity and darkening      Previous lesions on his thigh are sensitive    Trunk, back, upper extremities normal Papular eruption over the nape of the neck      Lab Results CBC Latest Ref Rng & Units 11/08/2017 11/07/2017 11/06/2017  WBC 3.8 - 10.6 K/uL 14.8(H) 17.1(H) 9.3  Hemoglobin 13.0 - 18.0 g/dL 7.1(L) 7.2(L) 7.0(L)  Hematocrit 40.0 - 52.0 % 21.4(L) 21.5(L) 20.4(L)  Platelets 150 - 440 K/uL 94(L) 64(L) 47(L)   CMP Latest Ref Rng & Units 11/06/2017 11/05/2017 11/04/2017  Glucose 70 - 99 mg/dL 129(H) 107(H) 103(H)  BUN 6 - 20 mg/dL 11 14 20   Creatinine 0.61 - 1.24 mg/dL 0.80 0.85 0.88  Sodium 135 - 145 mmol/L 137 132(L) 137  Potassium 3.5 - 5.1 mmol/L 3.1(L) 3.9 3.3(L)  Chloride 98 - 111 mmol/L 104 101 102  CO2 22 - 32 mmol/L 28 24 30   Calcium 8.9 - 10.3 mg/dL 7.9(L) 8.2(L) 8.7(L)  Total Protein 6.5 - 8.1 g/dL - 6.1(L) 6.6  Total Bilirubin 0.3 - 1.2 mg/dL - 0.5 0.3  Alkaline Phos  38 - 126 U/L - 51 55  AST 15 - 41 U/L - 14(L) 15  ALT 0 - 44 U/L - 10 13   8/22 /19 BC- NG 8/23 [facility]   Radiology Reviewed xray of the foot and CXR personally Scattered faint radiopaque densities about the skin and soft tissues subjacent to the first-interspace may be site of puncture wound. No radiographic findings of osteomyelitis  Assessment: 27 y.o. male with h/o testicular cancer diagnosed in sept  2017 s/p left orchidectomys/p 3 cycles of BEP chemo , recurrence in June 2019, started TIP chemo, July had DVT of the left leg with severe venous edema , blistering lesions on his left leg,and underwent percutaneous transluminal angioplasty and mechanical thrombectomy of theleft common femoral, external iliac and common iliac veins and  stent placement of the left common iliac vein and left external iliac vein. Left leg cellulitis and lesions on his left thigh is  Was recently in  [facility] last week to get chemo cycle 3 admitted with fever  and painful swelling left leg X 1 day    Discoid Skin lesions localized  only to  the legs- more on the left and one on the right thigh 1) New discoid painful lesion left calf with surrounding erythema which is now blistering and superficial skin is sloughing New discoid lesions left thigh and rt thigh- painful These lesions are atypical and has a broad DD  The current Lesions and the old lesions are target like  and could be  D.D Timing a week after chemo   -Fixed drug eruptions with erythema multiforme like picture -Bullous fixed drug eruption -Erythema multiforme  ecthyma gangrenosum Nodular vasculitis ?? Bullous pemphigoid ( less likely) Will need Skin biopsy for HPE and culture  Usually FDE does not cause fever  But with EM like lesions fever can happen- need to r/o drug reaction-especially to  Paclitaxel   Now has herpes simplex upper lip-  Being treated like cellulitis  Cefepime + clindamycin may be able to switch to PO  tomorrow    2) healing superficial target  like lesions on the left thigh 12 weeks old- sensitive now   3) Swelling of the left leg due to DVT from the retroperitoneal LN compressing on his veins-s/p mechanical thrombectomy and stent placement - he then developed blistering circular lesions on his thighs 2 weeks after his first cycle of chemo and has been treated with antibiotics. No cultures were sent form the lesions before Venous edema secondary to extensive Left DVT It is unusual for venous edema /congestion to cause circular lesions.   4) testicular carcinoma s/p left orchidectomy, recurrence of TIP  5) Anemia- Hb 7- will be getting PRBC  6) Thrombocytopenia due to chemo- management as per Dr.Corcoran   7) nail injury thru the shoe of the rt foot- Cover for pseudomonas with cefepime No obvious collection or infection    Discussed with patient and hospitalist

## 2017-11-08 NOTE — Progress Notes (Signed)
ANTICOAGULATION CONSULT NOTE  Pharmacy Consult for heparin drip Indication: DVT  No Known Allergies  Patient Measurements: Height: 6\' 3"  (190.5 cm) Weight: 280 lb 3.2 oz (127.1 kg) IBW/kg (Calculated) : 84.5 Heparin Dosing Weight: 112 kg  Vital Signs: Temp: 97.7 F (36.5 C) (08/26 0516) Temp Source: Oral (08/26 0516) BP: 109/67 (08/26 0516) Pulse Rate: 77 (08/26 0516)  Labs: Recent Labs    11/06/17 0531 11/07/17 0541  11/07/17 1226 11/07/17 2053 11/08/17 0442 11/08/17 1236  HGB 7.0* 7.2*  --   --   --  7.1*  --   HCT 20.4* 21.5*  --   --   --  21.4*  --   PLT 47* 64*  --   --   --  94*  --   APTT  --   --   --  37*  --   --   --   LABPROT 19.4*  --   --  14.4  --   --   --   INR 1.65  --   --  1.13  --   --   --   HEPARINUNFRC  --   --    < > 0.11* 0.20* 0.28* 0.41  CREATININE 0.80  --   --   --   --   --   --    < > = values in this interval not displayed.    Estimated Creatinine Clearance: 195.6 mL/min (by C-G formula based on SCr of 0.8 mg/dL).   Medical History: Past Medical History:  Diagnosis Date  . Asthma    AS A CHILD-NO INHALERS  . Cancer ([place])    testicular cancer 11/2016 per pt   . DVT (deep venous thrombosis) ([place])   . GERD (gastroesophageal reflux disease)    TUMS PRN    Medications:  PTA rivaroxaban 20mg  daily - last dose was 8/23 at 1600.  Assessment: 27 yo male admitted with history of testicular cancer (on chemotherapy) and DVT (IVC filter) for cellulitist found to have DVT. Patient takes rivaroxaban at home but it has been > 24 hours since last dose. Rivaroxaban placed on hold d/t low Plt 41 > 39 > 47 > 64 and low Hgb 7.2. Pharmacy has been consulted to dose heparin. Oncology also on board. As patient is thrombocytopenic and anemic, will not give bolus and will titrate up heparin drip based on levels. Ordered baseline APTT/PT INR/ and heparin level.   Heparin infusing at 2150 units/hr, 8/26 12:00 Heparin level resulted at 0.41  Goal of  Therapy:  Heparin level 0.3-0.7 units/ml Monitor platelets by anticoagulation protocol: Yes   Plan:  Will continue current rate 2150 units/hr and recheck heparin level in 6 hours (1800) and daily CBC while on heparin with am labs.  Evelena Asa, PharmD 11/08/2017 1326

## 2017-11-08 NOTE — Progress Notes (Signed)
[place] [facility] [place] NAME: Gaelan Glennon    MR#:  [id number]  DATE OF BIRTH:  07-25-1990  SUBJECTIVE:  CHIEF COMPLAINT:  No chief complaint on file.  Continues to have pain left thigh.   Waiting for skin biopsy tomorrow Afebrile   REVIEW OF SYSTEMS:    Review of Systems  Constitutional: Positive for malaise/fatigue. Negative for chills and fever.  HENT: Negative for sore throat.   Eyes: Negative for blurred vision, double vision and pain.  Respiratory: Negative for cough, hemoptysis, shortness of breath and wheezing.   Cardiovascular: Negative for chest pain, palpitations, orthopnea and leg swelling.  Gastrointestinal: Negative for abdominal pain, constipation, diarrhea, heartburn, nausea and vomiting.  Genitourinary: Negative for dysuria and hematuria.  Musculoskeletal: Negative for back pain and joint pain.  Skin: Negative for rash.  Neurological: Negative for sensory change, speech change, focal weakness and headaches.  Endo/Heme/Allergies: Does not bruise/bleed easily.  Psychiatric/Behavioral: Negative for depression. The patient is not nervous/anxious.     DRUG ALLERGIES:  No Known Allergies  VITALS:  Blood pressure 114/72, pulse 73, temperature 97.7 F (36.5 C), temperature source Oral, resp. rate 18, height 6\' 3"  (1.905 m), weight 127.1 kg, SpO2 97 %.  PHYSICAL EXAMINATION:   Physical Exam  GENERAL:  27 y.o.-year-old patient lying in the bed with no acute distress.  EYES: Pupils equal, round, reactive to light and accommodation. No scleral icterus. Extraocular muscles intact.  HEENT: Head atraumatic, normocephalic. Oropharynx and nasopharynx clear.  NECK:  Supple, no jugular venous distention. No thyroid enlargement, no tenderness.  LUNGS: Normal breath sounds bilaterally, no wheezing, rales, rhonchi. No use of accessory muscles of respiration.  CARDIOVASCULAR: S1, S2 normal. No murmurs, rubs, or gallops.  ABDOMEN:  Soft, nontender, nondistended. Bowel sounds present. No organomegaly or mass.  EXTREMITIES: No cyanosis, clubbing or edema b/l.    NEUROLOGIC: Cranial nerves II through XII are intact. No focal Motor or sensory deficits b/l.   PSYCHIATRIC: The patient is alert and oriented x 3.  SKIN: Left thigh large healing ulcers with no discharge. Left calf lesions. Right thigh indurated areas  LABORATORY PANEL:   CBC Recent Labs  Lab 11/08/17 0442  WBC 14.8*  HGB 7.1*  HCT 21.4*  PLT 94*   ------------------------------------------------------------------------------------------------------------------ Chemistries  Recent Labs  Lab 11/05/17 0943 11/06/17 0531  NA 132* 137  K 3.9 3.1*  CL 101 104  CO2 24 28  GLUCOSE 107* 129*  BUN 14 11  CREATININE 0.85 0.80  CALCIUM 8.2* 7.9*  MG 1.3* 1.6*  AST 14*  --   ALT 10  --   ALKPHOS 51  --   BILITOT 0.5  --    ------------------------------------------------------------------------------------------------------------------  Cardiac Enzymes No results for input(s): TROPONINI in the last 168 hours. ------------------------------------------------------------------------------------------------------------------  RADIOLOGY:  No results found.   ASSESSMENT AND PLAN:   *  Left lower extremity cellulitis vs drug reaction On IV antibiotics.  Appreciate ID input.  Cultures negative. Skin biopsy tomorrow  * Testicular cancer on chemotherapy Follows with Dr. Mike Gip  *Chronic left thigh skin lesions Healing  Skin biopsy scheduled for Tuesday  *Pancytopenia secondary to chemotherapy  *Left lower extremity DVT. Xarelto held due to thrombocytopenia. Oncology consulted.   Will start heparin drip today now that platelets are 64,000  All the records are reviewed and case discussed with Care Management/Social Worker Management plans discussed with the patient, family and they are in agreement.  CODE STATUS: FULL CODE  DVT  Prophylaxis: SCDs  TOTAL TIME TAKING CARE OF THIS PATIENT: 35 minutes.   POSSIBLE D/C IN 2-3 DAYS, DEPENDING ON CLINICAL CONDITION.  Leia Alf Kaeleigh Westendorf M.D on 11/08/2017 at 11:31 PM  Between 7am to 6pm - Pager - [phone]  After 6pm go to [email or web address] - password [email or web address] [email or web address]  Office  [phone]  CC: Primary care physician; Patient, No Pcp Per  Note: This dictation was prepared with Dragon dictation along with smaller phrase technology. Any transcriptional errors that result from this process are unintentional.

## 2017-11-08 NOTE — Progress Notes (Signed)
[facility] Hematology/Oncology Progress Note  Date of admission: 11/05/2017  Hospital day:  11/08/2017  Chief Complaint: Edgar [name] is a 27 y.o. male with recurrent testicular cancer who is admitted through the medical oncology clinic with fever and left lower extremity cellulitis and possible sepsis.  Subjective:  Feels "about the same".  Fever gone.  New blister left upper calf (similar to left thigh lesions 2-3 weeks ago).  Two new spots on right thigh, tender.  Herpetic upper lip lesion (new).  Social History: The patient is alone today.  Allergies: No Known Allergies  Scheduled Medications: . acyclovir  400 mg Oral TID  . dexamethasone  4 mg Oral Daily  . DULoxetine  60 mg Oral Daily  . gabapentin  300 mg Oral QHS  . loratadine  10 mg Oral Daily  . magnesium oxide  400 mg Oral Daily  . nicotine  14 mg Transdermal Daily  . oxyCODONE  10 mg Oral Q12H  . potassium chloride  20 mEq Oral BID  . silver sulfADIAZINE   Topical BID    Review of Systems: GENERAL:  Feels "the same". Fever curve dramatically improved (last temp 99.3 on 08/24 at 9 AM.  No sweats. PERFORMANCE STATUS (ECOG):  2 HEENT:  Dysgeusia.  No visual changes, runny nose, sore throat, mouth sores or tenderness. Lungs: Shortness of breath with exertion.  No cough.  No hemoptysis. Cardiac:  No chest pain, palpitations, orthopnea, or PND. GI:  Appetite, improved.  No nausea, vomiting, diarrhea, constipation, melena or hematochezia. GU:  No urgency, frequency, dysuria, or hematuria. Musculoskeletal:  No back pain.  No joint pain.  No muscle tenderness. Extremities:  No evidence of SBE.  Swelling persists left distal extremity.  Swelling resolved right lower extremity. Skin:  Healing old left thigh lesions .  Large hemorrhagic blister at top of dressing (new). Right leg with 2 tender lesions. Neuro:  General weakness (improving).  No headache, numbness or weakness, balance or coordination  issues. Endocrine:  No diabetes, thyroid issues, hot flashes or night sweats. Psych:  No mood changes, depression or anxiety. Pain:  No focal pain. Review of systems:  All other systems reviewed and found to be negative.   Physical Exam: Blood pressure 109/67, pulse 77, temperature 97.7 F (36.5 C), temperature source Oral, resp. rate 18, height 6\' 3"  (1.905 m), weight 280 lb 3.2 oz (127.1 kg), SpO2 100 %.  GENERAL:  Well developed, well nourished, gentlman sitting comfortably on the medical unit in no acute distress. MENTAL STATUS:  Alert and oriented to person, place and time. HEAD:  Alopecia.  Normocephalic, atraumatic, face symmetric, no Cushingoid features. EYES:  Brown eyes.  Pupils equal round and reactive to light and accomodation.  No conjunctivitis or scleral icterus. ENT:  Upper lip confluent herpetic lesion (new).  Oropharynx clear without lesion.  Tongue normal. Mucous membranes moist.  RESPIRATORY:  Clear to auscultation without rales, wheezes or rhonchi. CARDIOVASCULAR:  Regular rate and rhythm without murmur, rub or gallop. ABDOMEN:  Soft, non-tender, with active bowel sounds, and no hepatosplenomegaly.  No masses. SKIN:  No petechiae.  Healing old left thigh lesions (5 anterior, 1 posterior).  Left distal leg wrapped and edematous.  Large hemorrhagic blister at top of dressing. Right leg with 4-5 cm warm palpable lesion with a smaller 2 cm warm palpable lesion.  Small area of bruusing medially.  Plantar surface of right foot unremarkable except for dot of old blood at puncture site. EXTREMITIES: No stigmata  of SBE.  No edema, no skin discoloration or tenderness.  No palpable cords. LYMPH NODES: No palpable cervical, supraclavicular, axillary or inguinal adenopathy  NEUROLOGICAL: Unremarkable. PSYCH:  Appropriate.   Results for orders placed or performed during the hospital encounter of 11/05/17 (from the past 48 hour(s))  CBC with Differential     Status: Abnormal    Collection Time: 11/07/17  5:41 AM  Result Value Ref Range   WBC 17.1 (H) 3.8 - 10.6 K/uL   RBC 2.34 (L) 4.40 - 5.90 MIL/uL   Hemoglobin 7.2 (L) 13.0 - 18.0 g/dL   HCT 21.5 (L) 40.0 - 52.0 %   MCV 91.7 80.0 - 100.0 fL   MCH 30.8 26.0 - 34.0 pg   MCHC 33.5 32.0 - 36.0 g/dL   RDW 16.2 (H) 11.5 - 14.5 %   Platelets 64 (L) 150 - 440 K/uL   Neutrophils Relative % 90 %   Neutro Abs 15.4 (H) 1.4 - 6.5 K/uL   Lymphocytes Relative 3 %   Lymphs Abs 0.5 (L) 1.0 - 3.6 K/uL   Monocytes Relative 7 %   Monocytes Absolute 1.2 (H) 0.2 - 1.0 K/uL   Eosinophils Relative 0 %   Eosinophils Absolute 0.0 0 - 0.7 K/uL   Basophils Relative 0 %   Basophils Absolute 0.0 0 - 0.1 K/uL    Comment: Performed at [facility], [address] [place], [place] [address]  APTT     Status: Abnormal   Collection Time: 11/07/17 12:26 PM  Result Value Ref Range   aPTT 37 (H) 24 - 36 seconds    Comment:        IF BASELINE aPTT IS ELEVATED, SUGGEST PATIENT RISK ASSESSMENT BE USED TO DETERMINE APPROPRIATE ANTICOAGULANT THERAPY. Performed at [facility], [address] [place], [place] [address]   Protime-INR     Status: None   Collection Time: 11/07/17 12:26 PM  Result Value Ref Range   Prothrombin Time 14.4 11.4 - 15.2 seconds   INR 1.13     Comment: Performed at [facility], [place], [place] [address]  Heparin level (unfractionated)     Status: Abnormal   Collection Time: 11/07/17 12:26 PM  Result Value Ref Range   Heparin Unfractionated 0.11 (L) 0.30 - 0.70 IU/mL    Comment: (NOTE) If heparin results are below expected values, and patient dosage has  been confirmed, suggest follow up testing of antithrombin III levels. Performed at [facility], [place], [place] [address]   Heparin level (unfractionated)     Status: Abnormal   Collection Time: 11/07/17  8:53 PM  Result Value Ref Range   Heparin Unfractionated 0.20 (L) 0.30 - 0.70  IU/mL    Comment: (NOTE) If heparin results are below expected values, and patient dosage has  been confirmed, suggest follow up testing of antithrombin III levels. Performed at [facility], [address] [place], [place] [address]   CBC with Differential     Status: Abnormal   Collection Time: 11/08/17  4:42 AM  Result Value Ref Range   WBC 14.8 (H) 3.8 - 10.6 K/uL   RBC 2.35 (L) 4.40 - 5.90 MIL/uL   Hemoglobin 7.1 (L) 13.0 - 18.0 g/dL   HCT 21.4 (L) 40.0 - 52.0 %   MCV 91.3 80.0 - 100.0 fL   MCH 30.3 26.0 - 34.0 pg   MCHC 33.1 32.0 - 36.0 g/dL   RDW 16.0 (H) 11.5 -  14.5 %   Platelets 94 (L) 150 - 440 K/uL   Neutrophils Relative % 91 %   Neutro Abs 13.5 (H) 1.4 - 6.5 K/uL   Lymphocytes Relative 4 %   Lymphs Abs 0.6 (L) 1.0 - 3.6 K/uL   Monocytes Relative 5 %   Monocytes Absolute 0.7 0.2 - 1.0 K/uL   Eosinophils Relative 0 %   Eosinophils Absolute 0.0 0 - 0.7 K/uL   Basophils Relative 0 %   Basophils Absolute 0.0 0 - 0.1 K/uL    Comment: Performed at [facility], [place], [place] [address]  Heparin level (unfractionated)     Status: Abnormal   Collection Time: 11/08/17  4:42 AM  Result Value Ref Range   Heparin Unfractionated 0.28 (L) 0.30 - 0.70 IU/mL    Comment: (NOTE) If heparin results are below expected values, and patient dosage has  been confirmed, suggest follow up testing of antithrombin III levels. Performed at [facility], [place], [place] [address]   Heparin level (unfractionated)     Status: None   Collection Time: 11/08/17 12:36 PM  Result Value Ref Range   Heparin Unfractionated 0.41 0.30 - 0.70 IU/mL    Comment: (NOTE) If heparin results are below expected values, and patient dosage has  been confirmed, suggest follow up testing of antithrombin III levels. Performed at [facility], [address] [place], [place] [address]    No results found.  Assessment:  Edgar [name] is a 27 y.o. male with recurrent testicular cancer who is currently day 12 s/p cycle #2 TIP chemotherapy.  He presented with fever, chills, and tachycardia.  Exam revealed left lower extremity cellulitis.  He recently stepped on a nail through a shoe on his right foot.  He was admitted to [facility] 10/08/2017 - 10/16/2017 with apparent left lower extremity cellulitis. He was treated with Cefepime and vancomycin. He was felt tohave venous congestion syndrome with skin lesions due to congestion.Skin and edema rapidly improved after vascular procedure.  He underwent IVC filterplacement, percutaneous angioplastyand stentplacementof the left common iliac vein and left external iliac vein on 10/13/2017.  Bilateral lower extremity duplex on 11/05/2017 revealed no evidence of DVT.    CT angiogram of the abdomen on 11/05/2017 revealed patent left common and external iliac stents and infrarenal IVC.  There was decreasing left pelvic and retroperitoneal/para-aortic lymphadenopathy and ignificantly decreased left lower extremity edema with some persistent skin thickening in the medial aspect of the left thigh.  Symptomatically, his fever has resolved off antibiotics.  He has a new large hemorrhagic blister at the upper margin of his dressing on the left lower extremity.  He has two new lesions on his right thigh of unclear etiology.  Plan:  1.  Oncology:  Recurrent testicular cancer day 19 s/p cycle #2 TIP chemotherapy.  CT scans today reveal responding disease.  Plan to admit for cycle #3 on 11/16/2017.  2.  Hematology:  Patient experienced neutropenia post cycle #2 chemotherapy ([place] 0 on 11/01/2017).  Patient has chemotherapy induced anemia and thrombocytopenia.  Initial patelet count on admission was likely lower secondary to consumption associated with infection.  No evidence of DIC.              Patient is off Xarelto.  Heparin started yesterday.  Platelet count has improved  daily: 39,000 to 47,000 to 64,000 to 94,000.              Chemotherapy  induced anemia.  Hemoglobin 7.1.  Maintain type and screen.  Anticipate 1 unit of PRBCs tomorrow.  3.  Infectious disease:  Clinically patient presented with a LLE cellulitis with fever, chills, tachycardia and left lower extremity erythema.  Patient treated with broad spectrum antibiotics (Cefepime and clindamycin).  Patient also stepped on a nail through his shoe on his right foot prior to admission.  No obvious infection in right foot, but new lower extremity edema.  Right foot prophylaxis being covered with broad spectrum antibiotics.  Follow cultures.  Blood culture q 24 hours prn temp > 100.4.  Appreciate ID consult.              Patient has new herpetic labialis which erupted over weekend.  Begin acyclovir 400 mg po q 8 hours.  4.  Vascular:  Patient s/p stent on 10/13/2017.  CT scan reveals patent stent and no thrombosis.  Bilaeral lower extremity duplex reveals no thrombosis.  Etiology of edema likely secondary to inflammation/vascular leak and low oncotic pressure (anemia and low albumen). Patient to keep legs elevated.  Appreciate vascular consult.  5.  Dermatology:  Skin lesions in left lower extremity initially felt to be secondary to venous congestion syndrome.  Concern raised by ID that lesions may represent drug eruption, erythema multiforme, ecthyma gangrenosum, or nodular vasculitis.   Dermatology consulted.  6.  Fluids/Electroytes/Nutrition:  Patient is s/p cisplatin with electrolytes (potassium and magnesium) wasting.  Patient on supplemental potassium and magnesium at home.  Follow electrolytes daily.  7.  Pain and Toxicology:  Patient has pain associated with his legs.  He takes Oxycontin 10 mg po q 12 hours and oxycodone 10 mg po q 8 hors prn pain.   [name], MD  11/08/2017, 1:47 PM

## 2017-11-08 NOTE — Progress Notes (Signed)
ANTICOAGULATION CONSULT NOTE  Pharmacy Consult for heparin drip Indication: DVT  No Known Allergies  Patient Measurements: Height: 6\' 3"  (190.5 cm) Weight: 280 lb 3.2 oz (127.1 kg) IBW/kg (Calculated) : 84.5  Vital Signs: Temp: 98 F (36.7 C) (08/26 1511) Temp Source: Oral (08/26 1511) BP: 116/82 (08/26 1511) Pulse Rate: 76 (08/26 1511)  Labs: Recent Labs    11/06/17 0531 11/07/17 0541  11/07/17 1226 11/07/17 2053 11/08/17 0442 11/08/17 1236  HGB 7.0* 7.2*  --   --   --  7.1*  --   HCT 20.4* 21.5*  --   --   --  21.4*  --   PLT 47* 64*  --   --   --  94*  --   APTT  --   --   --  37*  --   --   --   LABPROT 19.4*  --   --  14.4  --   --   --   INR 1.65  --   --  1.13  --   --   --   HEPARINUNFRC  --   --    < > 0.11* 0.20* 0.28* 0.41  CREATININE 0.80  --   --   --   --   --   --    < > = values in this interval not displayed.    Estimated Creatinine Clearance: 195.6 mL/min (by C-G formula based on SCr of 0.8 mg/dL).   Medical History: Past Medical History:  Diagnosis Date  . Asthma    AS A CHILD-NO INHALERS  . Cancer ([place])    testicular cancer 11/2016 per pt   . DVT (deep venous thrombosis) ([place])   . GERD (gastroesophageal reflux disease)    TUMS PRN    Medications:  PTA rivaroxaban 20mg  daily - last dose was 8/23 at 1600.  Assessment: 27 yo male admitted with history of testicular cancer (on chemotherapy) and DVT (IVC filter) for cellulitis found to have DVT. Patient takes rivaroxaban at home but it had been > 24 hours since last dose on admission. Rivaroxaban placed on hold d/t low Plt 41 > 39 > 47 > 64 and low Hgb 7.2 (since leveled out) Pharmacy was consulted to dose heparin. Oncology also on board. As patient was thrombocytopenic and anemic, did not give bolus heparin drip and was started at 1750 units/hr  08/25 2053 HL 0.20 rate increased from 1750-1950 units/hr 08/26 0442 HL 0.28 rate increased from 1950-2150 units/hr 08/26 1236 HL 0.41 rate  maintained 08/26 1826 HL 0.34 rate maintained         Goal of Therapy:  Heparin level 0.3-0.7 units/ml Monitor platelets by anticoagulation protocol: Yes   Plan:   Maintain heparin infusion at 2150 units/hr Check anti-Xa level daily starting with am labs 8/27 while on heparin Continue to monitor H&H and platelets  Vallery Sa, PharmD 11/08/2017,6:00 PM

## 2017-11-08 NOTE — Progress Notes (Signed)
ANTICOAGULATION CONSULT NOTE  Pharmacy Consult for heparin drip Indication: DVT  No Known Allergies  Patient Measurements: Height: 6\' 3"  (190.5 cm) Weight: 280 lb 3.2 oz (127.1 kg) IBW/kg (Calculated) : 84.5 Heparin Dosing Weight: 112 kg  Vital Signs: Temp: 97.7 F (36.5 C) (08/26 0516) Temp Source: Oral (08/26 0516) BP: 109/67 (08/26 0516) Pulse Rate: 77 (08/26 0516)  Labs: Recent Labs    11/05/17 0943 11/05/17 1055 11/06/17 0531 11/07/17 0541 11/07/17 1226 11/07/17 2053 11/08/17 0442  HGB 7.0*  --  7.0* 7.2*  --   --  7.1*  HCT 20.5*  --  20.4* 21.5*  --   --  21.4*  PLT 39*  --  47* 64*  --   --  94*  APTT  --  45*  --   --  37*  --   --   LABPROT  --  14.9 19.4*  --  14.4  --   --   INR  --  1.18 1.65  --  1.13  --   --   HEPARINUNFRC  --   --   --   --  0.11* 0.20* 0.28*  CREATININE 0.85  --  0.80  --   --   --   --     Estimated Creatinine Clearance: 195.6 mL/min (by C-G formula based on SCr of 0.8 mg/dL).   Medical History: Past Medical History:  Diagnosis Date  . Asthma    AS A CHILD-NO INHALERS  . Cancer ([place])    testicular cancer 11/2016 per pt   . DVT (deep venous thrombosis) ([place])   . GERD (gastroesophageal reflux disease)    TUMS PRN    Medications:  PTA rivaroxaban 20mg  daily - last dose was 8/23 at 1600.  Assessment: 27 yo male admitted with history of testicular cancer (on chemotherapy) and DVT (IVC filter) for cellulitist found to have DVT. Patient takes rivaroxaban at home but it has been > 24 hours since last dose. Rivaroxaban placed on hold d/t low Plt 41 > 39 > 47 > 64 and low Hgb 7.2. Pharmacy has been consulted to dose heparin. Oncology also on board. As patient is thrombocytopenic and anemic, will not give bolus and will titrate up heparin drip based on levels. Ordered baseline APTT/PT INR/ and heparin level.   Heparin infusing at 1750 units/hr, 8/25 21:00 Heparin level resulted at 0.20  Goal of Therapy:  Heparin level 0.3-0.7  units/ml Monitor platelets by anticoagulation protocol: Yes   Plan:  Will increase Heparin drip to 1950 units/hr. Next Heparin level in 6 hours. Daily CBC while on Heparin infusion.  8/26 AM heparin level 0.28. 1700 unit bolus and increase rate to 2150 units/hr. Recheck in 6 hours.  Paulina Fusi, PharmD, BCPS 11/08/2017 5:27 AM

## 2017-11-09 ENCOUNTER — Encounter: Payer: Self-pay | Admitting: *Deleted

## 2017-11-09 ENCOUNTER — Encounter
Admission: AD | Disposition: A | Discharge status: Home Health | Payer: Self-pay | Source: Home / Self Care | Attending: Internal Medicine

## 2017-11-09 DIAGNOSIS — L03116 Cellulitis of left lower limb: Secondary | ICD-10-CM

## 2017-11-09 DIAGNOSIS — Z86718 Personal history of other venous thrombosis and embolism: Secondary | ICD-10-CM

## 2017-11-09 DIAGNOSIS — S91331D Puncture wound without foreign body, right foot, subsequent encounter: Secondary | ICD-10-CM

## 2017-11-09 DIAGNOSIS — Z95820 Peripheral vascular angioplasty status with implants and grafts: Secondary | ICD-10-CM

## 2017-11-09 DIAGNOSIS — Z79899 Other long term (current) drug therapy: Secondary | ICD-10-CM

## 2017-11-09 DIAGNOSIS — Z8547 Personal history of malignant neoplasm of testis: Secondary | ICD-10-CM

## 2017-11-09 DIAGNOSIS — B009 Herpesviral infection, unspecified: Secondary | ICD-10-CM

## 2017-11-09 DIAGNOSIS — Z9221 Personal history of antineoplastic chemotherapy: Secondary | ICD-10-CM

## 2017-11-09 DIAGNOSIS — W228XXD Striking against or struck by other objects, subsequent encounter: Secondary | ICD-10-CM

## 2017-11-09 DIAGNOSIS — Z9889 Other specified postprocedural states: Secondary | ICD-10-CM

## 2017-11-09 DIAGNOSIS — L988 Other specified disorders of the skin and subcutaneous tissue: Secondary | ICD-10-CM

## 2017-11-09 DIAGNOSIS — D649 Anemia, unspecified: Secondary | ICD-10-CM

## 2017-11-09 DIAGNOSIS — Z9079 Acquired absence of other genital organ(s): Secondary | ICD-10-CM

## 2017-11-09 DIAGNOSIS — C6290 Malignant neoplasm of unspecified testis, unspecified whether descended or undescended: Secondary | ICD-10-CM

## 2017-11-09 DIAGNOSIS — L989 Disorder of the skin and subcutaneous tissue, unspecified: Secondary | ICD-10-CM

## 2017-11-09 DIAGNOSIS — D6959 Other secondary thrombocytopenia: Secondary | ICD-10-CM

## 2017-11-09 DIAGNOSIS — T451X5D Adverse effect of antineoplastic and immunosuppressive drugs, subsequent encounter: Secondary | ICD-10-CM

## 2017-11-09 HISTORY — PX: FINE NEEDLE ASPIRATION BIOPSY: CATH118315

## 2017-11-09 LAB — CBC WITH DIFFERENTIAL/PLATELET
Basophils Absolute: 0 10*3/uL (ref 0–0.1)
Basophils Relative: 0 %
Eosinophils Absolute: 0 10*3/uL (ref 0–0.7)
Eosinophils Relative: 0 %
HCT: 22.3 % — ABNORMAL LOW (ref 40.0–52.0)
Hemoglobin: 7.6 g/dL — ABNORMAL LOW (ref 13.0–18.0)
Lymphocytes Relative: 9 %
Lymphs Abs: 0.8 10*3/uL — ABNORMAL LOW (ref 1.0–3.6)
MCH: 31.6 pg (ref 26.0–34.0)
MCHC: 34 g/dL (ref 32.0–36.0)
MCV: 92.9 fL (ref 80.0–100.0)
Monocytes Absolute: 0.5 10*3/uL (ref 0.2–1.0)
Monocytes Relative: 6 %
Neutro Abs: 7.5 10*3/uL — ABNORMAL HIGH (ref 1.4–6.5)
Neutrophils Relative %: 85 %
Platelets: 132 10*3/uL — ABNORMAL LOW (ref 150–440)
RBC: 2.4 MIL/uL — ABNORMAL LOW (ref 4.40–5.90)
RDW: 16.1 % — ABNORMAL HIGH (ref 11.5–14.5)
WBC: 8.8 10*3/uL (ref 3.8–10.6)

## 2017-11-09 LAB — CREATININE, SERUM
Creatinine, Ser: 0.8 mg/dL (ref 0.61–1.24)
GFR calc non Af Amer: >60 mL/min (ref >60)

## 2017-11-09 LAB — MRSA PCR SCREENING: MRSA by PCR: NEGATIVE

## 2017-11-09 LAB — CULTURE, BLOOD (ROUTINE X 2): Culture: NO GROWTH

## 2017-11-09 LAB — TYPE AND SCREEN
ABO/RH(D): A POS
Antibody Screen: NEGATIVE

## 2017-11-09 LAB — HEPARIN LEVEL (UNFRACTIONATED): HEPARIN UNFRACTIONATED: 0.35 [IU]/mL (ref 0.30–0.70)

## 2017-11-09 SURGERY — FINE NEEDLE ASPIRATION BIOPSY
Anesthesia: Moderate Sedation

## 2017-11-09 MED ORDER — FENTANYL CITRATE (PF) 100 MCG/2ML IJ SOLN
INTRAMUSCULAR | Status: AC
  Filled 2017-11-09: qty 2

## 2017-11-09 MED ORDER — OXYCODONE HCL ER 10 MG PO T12A: 10.0000 mg | EXTENDED_RELEASE_TABLET | Freq: Two times a day (BID) | ORAL | 0 refills | Status: DC

## 2017-11-09 MED ORDER — AMOXICILLIN-POT CLAVULANATE 875-125 MG PO TABS: 1.0000 | ORAL_TABLET | Freq: Two times a day (BID) | ORAL | 0 refills | Status: AC

## 2017-11-09 MED ORDER — MIDAZOLAM HCL 2 MG/2ML IJ SOLN
INTRAMUSCULAR | Status: DC | PRN
  Administered 2017-11-09: 1 mg via INTRAVENOUS
  Administered 2017-11-09: 2 mg via INTRAVENOUS
  Administered 2017-11-09: 1 mg via INTRAVENOUS

## 2017-11-09 MED ORDER — LIDOCAINE-EPINEPHRINE (PF) 1 %-1:200000 IJ SOLN
INTRAMUSCULAR | Status: AC
  Filled 2017-11-09: qty 30

## 2017-11-09 MED ORDER — SILVER SULFADIAZINE 1 % EX CREA
TOPICAL_CREAM | Freq: Every day | CUTANEOUS | Status: DC
  Administered 2017-11-09: 12:00:00 via TOPICAL
  Filled 2017-11-09: qty 85

## 2017-11-09 MED ORDER — OXYCODONE HCL 10 MG PO TABS: 10.0000 mg | ORAL_TABLET | Freq: Three times a day (TID) | ORAL | 0 refills | Status: DC | PRN

## 2017-11-09 MED ORDER — MUPIROCIN CALCIUM 2 % EX CREA: TOPICAL_CREAM | Freq: Three times a day (TID) | CUTANEOUS | 1 refills | Status: DC

## 2017-11-09 MED ORDER — FENTANYL CITRATE (PF) 100 MCG/2ML IJ SOLN
INTRAMUSCULAR | Status: DC | PRN
  Administered 2017-11-09: 50 ug via INTRAVENOUS
  Administered 2017-11-09: 25 ug via INTRAVENOUS
  Administered 2017-11-09: 50 ug via INTRAVENOUS

## 2017-11-09 MED ORDER — RIVAROXABAN 20 MG PO TABS
20.0000 mg | ORAL_TABLET | Freq: Every day | ORAL | Status: DC
  Administered 2017-11-09: 20 mg via ORAL
  Filled 2017-11-09: qty 1

## 2017-11-09 MED ORDER — MUPIROCIN CALCIUM 2 % EX CREA
TOPICAL_CREAM | Freq: Three times a day (TID) | CUTANEOUS | Status: DC
  Administered 2017-11-09: 16:00:00 via TOPICAL
  Filled 2017-11-09: qty 15

## 2017-11-09 MED ORDER — MIDAZOLAM HCL 5 MG/5ML IJ SOLN
INTRAMUSCULAR | Status: AC
  Filled 2017-11-09: qty 5

## 2017-11-09 SURGICAL SUPPLY — 5 items
GOWN STRL XL  DISP (MISCELLANEOUS) ×4 IMPLANT
PUNCH BIOPSY 3 (MISCELLANEOUS) ×2 IMPLANT
SUT MNCRL AB 4-0 PS2 18 (SUTURE) ×2 IMPLANT
TOWEL OR 17X26 4PK STRL BLUE (TOWEL DISPOSABLE) ×2 IMPLANT
TRAY LACERAT/PLASTIC (MISCELLANEOUS) ×2 IMPLANT

## 2017-11-09 NOTE — Progress Notes (Signed)
ANTICOAGULATION CONSULT NOTE  Pharmacy Consult for heparin drip Indication: DVT  No Known Allergies  Patient Measurements: Height: 6\' 3"  (190.5 cm) Weight: 280 lb 3.2 oz (127.1 kg) IBW/kg (Calculated) : 84.5  Vital Signs: Temp: 98.1 F (36.7 C) (08/27 0446) Temp Source: Oral (08/27 0446) BP: 106/75 (08/27 0446) Pulse Rate: 63 (08/27 0446)  Labs: Recent Labs    11/06/17 0531 11/07/17 0541 11/07/17 1226  11/08/17 0442 11/08/17 1236 11/08/17 1826 11/09/17 0429  HGB 7.0* 7.2*  --   --  7.1*  --   --   --   HCT 20.4* 21.5*  --   --  21.4*  --   --   --   PLT 47* 64*  --   --  94*  --   --   --   APTT  --   --  37*  --   --   --   --   --   LABPROT 19.4*  --  14.4  --   --   --   --   --   INR 1.65  --  1.13  --   --   --   --   --   HEPARINUNFRC  --   --  0.11*   < > 0.28* 0.41 0.34 0.35  CREATININE 0.80  --   --   --   --   --   --   --    < > = values in this interval not displayed.    Estimated Creatinine Clearance: 195.6 mL/min (by C-G formula based on SCr of 0.8 mg/dL).   Medical History: Past Medical History:  Diagnosis Date  . Asthma    AS A CHILD-NO INHALERS  . Cancer ([place])    testicular cancer 11/2016 per pt   . DVT (deep venous thrombosis) ([place])   . GERD (gastroesophageal reflux disease)    TUMS PRN    Medications:  PTA rivaroxaban 20mg  daily - last dose was 8/23 at 1600.  Assessment: 27 yo male admitted with history of testicular cancer (on chemotherapy) and DVT (IVC filter) for cellulitis found to have DVT. Patient takes rivaroxaban at home but it had been > 24 hours since last dose on admission. Rivaroxaban placed on hold d/t low Plt 41 > 39 > 47 > 64 and low Hgb 7.2 (since leveled out) Pharmacy was consulted to dose heparin. Oncology also on board. As patient was thrombocytopenic and anemic, did not give bolus heparin drip and was started at 1750 units/hr  08/25 2053 HL 0.20 rate increased from 1750-1950 units/hr 08/26 0442 HL 0.28 rate increased  from 1950-2150 units/hr 08/26 1236 HL 0.41 rate maintained 08/26 1826 HL 0.34 rate maintained         Goal of Therapy:  Heparin level 0.3-0.7 units/ml Monitor platelets by anticoagulation protocol: Yes   Plan:  08/27 @ 0510 Anti-Xa (HL) 0.35. Level remains therapeutic.  Will continue heparin infusion at 2150 units/hr Continue to check anti-Xa level and CBC daily while on heparin Continue to monitor H&H and platelets  Pernell Dupre, PharmD, BCPS Clinical Pharmacist 11/09/2017 5:18 AM

## 2017-11-09 NOTE — Op Note (Signed)
    OPERATIVE NOTE   PROCEDURE: 3 mm punch skin biopsy left calf x6  PRE-OPERATIVE DIAGNOSIS: Necrotic ulcerative lesion left calf  POST-OPERATIVE DIAGNOSIS: Same  SURGEON: Hortencia Pilar  ASSISTANT(S): None  ANESTHESIA: Conscious sedation with IV Versed and fentanyl for 26 minutes  ESTIMATED BLOOD LOSS: Less than 5 cc  FINDING(S): Ulceration of the skin which appears to be a deep partial-thickness  SPECIMEN(S): 3 mm punch biopsies x6 4 are placed in formalin 2 are placed in a culture tube for microbiology  INDICATIONS:   Edgar [name] is a 27 y.o. male who presents with multiple ulcerative lesions bilateral lower extremities status post chemotherapy.  DESCRIPTION: After full informed written consent was obtained from the patient, the patient was brought back to the special procedures and placed prone upon the operating table.  Prior to induction, the patient received IV antibiotics.   After obtaining adequate anesthesia, the patient was then prepped and draped in the standard fashion for a punch biopsies of the necrotic ulcerative lesion.  1% lidocaine is infiltrated into the soft tissues to achieve local anesthesia.  A 3 mm punch biopsy is utilized.  A total of 6 core samples are taken all from this ulcerative lesion.  Lesion itself measures approximately 8 cm in diameter and is somewhat circular in shape.  Light pressure was held and then each biopsy site is closed using a 4-0 Monocryl simple suture.  Silvadene and a sterile dressing is reapplied.   The patient tolerated this procedure well.   COMPLICATIONS: None  CONDITION: [name]  Office: [phone]   11/09/2017, 10:44 AM

## 2017-11-09 NOTE — Progress Notes (Signed)
[facility] Hematology/Oncology Progress Note  Date of admission: 11/05/2017  Hospital day:  11/09/2017  Chief Complaint: Edgar [name] is a 27 y.o. male with recurrent testicular cancer who is admitted through the medical oncology clinic with fever and left lower extremity cellulitis and possible sepsis.  Subjective:  Feels "ok".  Fever gone.  No new blisters.  Two spots on right thigh, tender.  Herpetic upper lip lesion a little better, drying.  Mood better as being discharged.  Social History: The patient is accompanied by his mother today.  Allergies: No Known Allergies  Scheduled Medications: . acyclovir  400 mg Oral TID  . dexamethasone  4 mg Oral Daily  . DULoxetine  60 mg Oral Daily  . gabapentin  300 mg Oral QHS  . loratadine  10 mg Oral Daily  . magnesium oxide  400 mg Oral Daily  . mupirocin cream   Topical TID  . nicotine  14 mg Transdermal Daily  . oxyCODONE  10 mg Oral Q12H  . potassium chloride  20 mEq Oral BID  . rivaroxaban  20 mg Oral Q supper    Review of Systems: GENERAL:  Feels "ok".  No fevers, sweats or weight loss. PERFORMANCE STATUS (ECOG):  2 HEENT:  No visual changes, runny nose, sore throat, mouth sores or tenderness. Lungs: Shortness of breath with exertion.  No cough.  No hemoptysis. Cardiac:  No chest pain, palpitations, orthopnea, or PND. GI:  Appetite, normal.  No nausea, vomiting, diarrhea, constipation, melena or hematochezia. GU:  No urgency, frequency, dysuria, or hematuria. Musculoskeletal:  No back pain.  No joint pain.  No muscle tenderness. Extremities:  Left distal ower extremity swelling.  No pain. Skin:  No new skin lesions. Neuro:  General weakness.  No headache, numbness or weakness, balance or coordination issues. Endocrine:  No diabetes, thyroid issues, hot flashes or night sweats. Psych:  No mood changes, depression or anxiety. Pain:  No focal pain. Review of systems:  All other systems reviewed and  found to be negative.   Physical Exam: Blood pressure 116/78, pulse 60, temperature 98.1 F (36.7 C), temperature source Oral, resp. rate 12, height 6' 3"  (1.905 m), weight 280 lb 3.2 oz (127.1 kg), SpO2 98 %.  GENERAL:  Well developed, well nourished, gentleman sitting comfortably in the exam room in no acute distress. MENTAL STATUS:  Alert and oriented to person, place and time. HEAD:  Alopecia.  Normocephalic, atraumatic, face symmetric, no Cushingoid features. EYES:  Brown eyes.  Pupils equal round and reactive to light and accomodation.  No conjunctivitis or scleral icterus. ENT:  Upper lip herpetic lesions, drying.  Oropharynx clear without lesion.  Tongue normal. Mucous membranes moist.  RESPIRATORY:  Clear to auscultation without rales, wheezes or rhonchi. CARDIOVASCULAR:  Regular rate and rhythm without murmur, rub or gallop. ABDOMEN:  Soft, non-tender, with active bowel sounds, and no hepatosplenomegaly.  No masses. SKIN: Healing left thigh circular lesions.  Left distal leg wrapped and edematous.  Hemorrhagic blister at top of dressing s/p biopsy x 6. Right leg with 2 palpable lesions (no longer warm).  Right plantar puncture wound unremarkable EXTREMITIES: No edema, no skin discoloration or tenderness.  No palpable cords. NEUROLOGICAL: Unremarkable. PSYCH:  Appropriate.    Results for orders placed or performed during the hospital encounter of 11/05/17 (from the past 48 hour(s))  Heparin level (unfractionated)     Status: Abnormal   Collection Time: 11/07/17  8:53 PM  Result Value Ref Range  Heparin Unfractionated 0.20 (L) 0.30 - 0.70 IU/mL    Comment: (NOTE) If heparin results are below expected values, and patient dosage has  been confirmed, suggest follow up testing of antithrombin III levels. Performed at [facility], [address] [place], [place] [address]   CBC with Differential     Status: Abnormal   Collection Time: 11/08/17  4:42 AM  Result Value  Ref Range   WBC 14.8 (H) 3.8 - 10.6 K/uL   RBC 2.35 (L) 4.40 - 5.90 MIL/uL   Hemoglobin 7.1 (L) 13.0 - 18.0 g/dL   HCT 21.4 (L) 40.0 - 52.0 %   MCV 91.3 80.0 - 100.0 fL   MCH 30.3 26.0 - 34.0 pg   MCHC 33.1 32.0 - 36.0 g/dL   RDW 16.0 (H) 11.5 - 14.5 %   Platelets 94 (L) 150 - 440 K/uL   Neutrophils Relative % 91 %   Neutro Abs 13.5 (H) 1.4 - 6.5 K/uL   Lymphocytes Relative 4 %   Lymphs Abs 0.6 (L) 1.0 - 3.6 K/uL   Monocytes Relative 5 %   Monocytes Absolute 0.7 0.2 - 1.0 K/uL   Eosinophils Relative 0 %   Eosinophils Absolute 0.0 0 - 0.7 K/uL   Basophils Relative 0 %   Basophils Absolute 0.0 0 - 0.1 K/uL    Comment: Performed at [facility], [address] [place], [place] [address]  Heparin level (unfractionated)     Status: Abnormal   Collection Time: 11/08/17  4:42 AM  Result Value Ref Range   Heparin Unfractionated 0.28 (L) 0.30 - 0.70 IU/mL    Comment: (NOTE) If heparin results are below expected values, and patient dosage has  been confirmed, suggest follow up testing of antithrombin III levels. Performed at [facility], [place], [place] [address]   Heparin level (unfractionated)     Status: None   Collection Time: 11/08/17 12:36 PM  Result Value Ref Range   Heparin Unfractionated 0.41 0.30 - 0.70 IU/mL    Comment: (NOTE) If heparin results are below expected values, and patient dosage has  been confirmed, suggest follow up testing of antithrombin III levels. Performed at [facility], [place], [place] [address]   Heparin level (unfractionated)     Status: None   Collection Time: 11/08/17  6:26 PM  Result Value Ref Range   Heparin Unfractionated 0.34 0.30 - 0.70 IU/mL    Comment: (NOTE) If heparin results are below expected values, and patient dosage has  been confirmed, suggest follow up testing of antithrombin III levels. Performed at [facility], [address] [place], [place]  [address]   CBC with Differential     Status: Abnormal   Collection Time: 11/09/17  4:29 AM  Result Value Ref Range   WBC 8.8 3.8 - 10.6 K/uL   RBC 2.40 (L) 4.40 - 5.90 MIL/uL   Hemoglobin 7.6 (L) 13.0 - 18.0 g/dL   HCT 22.3 (L) 40.0 - 52.0 %   MCV 92.9 80.0 - 100.0 fL   MCH 31.6 26.0 - 34.0 pg   MCHC 34.0 32.0 - 36.0 g/dL   RDW 16.1 (H) 11.5 - 14.5 %   Platelets 132 (L) 150 - 440 K/uL   Neutrophils Relative % 85 %   Lymphocytes Relative 9 %   Monocytes Relative 6 %   Eosinophils Relative 0 %   Basophils Relative 0 %   Neutro Abs 7.5 (H) 1.4 -  6.5 K/uL   Lymphs Abs 0.8 (L) 1.0 - 3.6 K/uL   Monocytes Absolute 0.5 0.2 - 1.0 K/uL   Eosinophils Absolute 0.0 0 - 0.7 K/uL   Basophils Absolute 0.0 0 - 0.1 K/uL   RBC Morphology MIXED RBC POPULATION     Comment: Performed at [facility], [address] [place], [place] [address]  Type and screen [place]     Status: None   Collection Time: 11/09/17  4:29 AM  Result Value Ref Range   ABO/RH(D) A POS    Antibody Screen NEG    Sample Expiration      11/12/2017 Performed at [facility], [address] [place], [place] [address]   Heparin level (unfractionated)     Status: None   Collection Time: 11/09/17  4:29 AM  Result Value Ref Range   Heparin Unfractionated 0.35 0.30 - 0.70 IU/mL    Comment: (NOTE) If heparin results are below expected values, and patient dosage has  been confirmed, suggest follow up testing of antithrombin III levels. Performed at [facility], [address] [place], [place] [address]   MRSA PCR Screening     Status: None   Collection Time: 11/09/17  2:03 PM  Result Value Ref Range   MRSA by PCR NEGATIVE NEGATIVE    Comment:        The GeneXpert MRSA Assay (FDA approved for NASAL specimens only), is one component of a comprehensive MRSA colonization surveillance program. It is not intended to diagnose MRSA infection nor to guide or monitor  treatment for MRSA infections. Performed at [facility], [address] [place], [place] [address]   Creatinine, serum     Status: None   Collection Time: 11/09/17  2:30 PM  Result Value Ref Range   Creatinine, Ser 0.80 0.61 - 1.24 mg/dL   GFR calc non Af Amer >60 >60 mL/min   GFR calc Af Amer >60 >60 mL/min    Comment: (NOTE) The eGFR has been calculated using the CKD EPI equation. This calculation has not been validated in all clinical situations. eGFR's persistently <60 mL/min signify possible Chronic Kidney Disease. Performed at [facility], [address] [place], [place] [address]    No results found.  Assessment:  Edgar [name] is a 27 y.o. male with recurrent testicular cancer who is currently day 20 s/p cycle #2 TIP chemotherapy.  He presented with fever, chills, and tachycardia.  Exam revealed left lower extremity cellulitis.  He recently stepped on a nail through a shoe on his right foot.  He was admitted to [facility] 10/08/2017 - 10/16/2017 with apparent left lower extremity cellulitis. He was treated with Cefepime and vancomycin. He was felt tohave venous congestion syndrome with skin lesions due to congestion.Skin and edema rapidly improved after vascular procedure.  He underwent IVC filterplacement, percutaneous angioplastyand stentplacementof the left common iliac vein and left external iliac vein on 10/13/2017.  Bilateral lower extremity duplex on 11/05/2017 revealed no evidence of DVT.    CT angiogram of the abdomen on 11/05/2017 revealed patent left common and external iliac stents and infrarenal IVC.  There was decreasing left pelvic and retroperitoneal/para-aortic lymphadenopathy and ignificantly decreased left lower extremity edema with some persistent skin thickening in the medial aspect of the left thigh.  Symptomatically, his fever has resolved on antibiotics.  He has no new hemorrhagic blisters.  Righ thigh lesions  appear to be fading.    Plan:  1.  Oncology:  Recurrent testicular cancer day 20 s/p cycle #2 TIP chemotherapy.  CT scans today reveal responding disease.  Plan to admit for cycle #3 on 11/16/2017.  2.  Hematology:  Patient experienced neutropenia post cycle #2 chemotherapy ([place] 0 on 11/01/2017).  Patient has chemotherapy induced anemia and thrombocytopenia.  Initial patelet count on admission was likely lower secondary to consumption associated with infection.  No evidence of DIC.              Patient is off Xarelto.  Patient currently on heparin.  Platelet count has improved daily: 39,000 to 47,000 to 64,000 to 94,000 to 132,000.              Plan to restart Xarelto today.   Chemotherapy induced anemia.  Hemoglobin 7.6.   3.  Infectious disease:  Clinically patient presented with a LLE cellulitis with fever, chills, tachycardia and left lower extremity erythema.  Patient treated with broad spectrum antibiotics (Cefepime and clindamycin).  Patient also stepped on a nail through his shoe on his right foot prior to admission.  No obvious infection in right foot, but new lower extremity edema.  Right foot prophylaxis being covered with broad spectrum antibiotics.  Follow cultures.  Anticipate discharge on Augmentin for 5-7 days.  Appreciate ID consult.              Patient with herpetic labialis.  Aacyclovir 400 mg po q 8 hours began 11/08/2017.  4.  Vascular:  Patient s/p stent on 10/13/2017.  CT scan reveals patent stent and no thrombosis.  Bilaeral lower extremity duplex reveals no thrombosis.  Etiology of edema likely secondary to inflammation/vascular leak and low oncotic pressure (anemia and low albumen). Patient to keep legs elevated.  Appreciate vascular consult.  5.  Dermatology:  Skin lesions in left lower extremity initially felt to be secondary to venous congestion syndrome.  Concern raised by ID that lesions may represent drug eruption, erythema multiforme, ecthyma gangrenosum,  or nodular vasculitis.   Dermatology consult appreciated.  Lesions felt to possibly represent bullous impetigo due to staph or strept.  Biopsies with cultures performed today.  Patient to be seen in follow-up by dermatology in the outpatient department.  6.  Fluids/Electroytes/Nutrition:  Patient is s/p cisplatin with electrolytes (potassium and magnesium) wasting.  Patient on supplemental potassium and magnesium at home.  Follow electrolytes daily.  7.  Pain and Toxicology:  Patient has pain associated with his legs.  He takes Oxycontin 10 mg po q 12 hours and oxycodone 10 mg po q 8 hors prn pain.  8.  Disposition:  Anticipate discharge to outpatient department with follow-up in the oncology clinic on Friday, 11/12/2017.  Patient to contact clinic if lesions worsen.   [name], MD  11/09/2017, 4:44 PM

## 2017-11-09 NOTE — Care Management Note (Signed)
Case Management Note  Patient Details  Name: Edgar [name] MRN: [id number] Date of Birth: 07/10/1990  Subjective/Objective:  [facility] notified of discharge.  Will have resumption of RN, PT, OT. No DME needs.  Orders for home health are in.                Action/Plan:   Expected Discharge Date:  11/09/17               Expected Discharge Plan:     In-House Referral:     Discharge planning Services  CM Consult  Post Acute Care Choice:  Home Health, Resumption of Svcs/PTA Provider Choice offered to:  Patient  DME Arranged:    DME Agency:     HH Arranged:  RN [place] Agency:  [facility]  Status of Service:  Completed, signed off  If discussed at [place] of Stay Meetings, dates discussed:    Additional Comments:  Jolly Mango, RN 11/09/2017, 4:00 PM

## 2017-11-09 NOTE — Progress Notes (Signed)
Patient discharged home per MD orders. All discharge instructions given and all questions answered. 

## 2017-11-09 NOTE — Progress Notes (Signed)
subjective Feeling okay No fever Wants to go home Got punch biopsies Seen by Dermatolgist   OBJECTIVE: BP 117/80   Pulse 62   Temp 98.1 F (36.7 C) (Oral)   Resp 16   Ht 6\' 3"  (1.905 m)   Wt 127.1 kg   SpO2 99%   BMI 35.02 kg/m   Physical Exam  Constitutional:  No distress.  HENT:  Head: Normocephalic.  Mouth/Throat: Oropharynx is clear and moist. upper lip herpetic lesion Eyes: Pupils are equal, round, and reactive to light.  Neck: Neck supple.  Cardiovascular: Intact distal pulses.  No murmur heard. S1s2,  Pulmonary/Chest: CTA  Abdominal: Soft. There is no tenderness.  Musculoskeletal: He exhibits edema (left leg)much better  Left leg is less swollen Erythematous left calf with exquisite tenderness developing circular lesions calf Also has circular erythema over the rt thigh and left thigh which are not tender   Neurological: He is alert and oriented to person, place, and time. No cranial nerve deficit.  Skin: Skin is warm.  Psychiatric: He has a normal mood and affect.   skin Healing circular lesions left thigh with no erythema  Or induration   rt thigh lateral aspect- 5 cm circular erythema with central nodularity and tenderness     Rt foot -site of nail prick - no erythema or tenderness     Left thigh- above knee- new area of circualr erythema with central nodularity    Back of left calf has erythematous area with central nodualrity and darkening      Previous lesions on his thigh are sensitive    Trunk, back, upper extremities normal Papular eruption over the nape of the neck      Lab Results CBC Latest Ref Rng & Units 11/09/2017 11/08/2017 11/07/2017  WBC 3.8 - 10.6 K/uL 8.8 14.8(H) 17.1(H)  Hemoglobin 13.0 - 18.0 g/dL 7.6(L) 7.1(L) 7.2(L)  Hematocrit 40.0 - 52.0 % 22.3(L) 21.4(L) 21.5(L)  Platelets 150 - 440 K/uL 132(L) 94(L) 64(L)   CMP Latest Ref Rng & Units 11/06/2017 11/05/2017 11/04/2017  Glucose 70 -  99 mg/dL 129(H) 107(H) 103(H)  BUN 6 - 20 mg/dL 11 14 20   Creatinine 0.61 - 1.24 mg/dL 0.80 0.85 0.88  Sodium 135 - 145 mmol/L 137 132(L) 137  Potassium 3.5 - 5.1 mmol/L 3.1(L) 3.9 3.3(L)  Chloride 98 - 111 mmol/L 104 101 102  CO2 22 - 32 mmol/L 28 24 30   Calcium 8.9 - 10.3 mg/dL 7.9(L) 8.2(L) 8.7(L)  Total Protein 6.5 - 8.1 g/dL - 6.1(L) 6.6  Total Bilirubin 0.3 - 1.2 mg/dL - 0.5 0.3  Alkaline Phos 38 - 126 U/L - 51 55  AST 15 - 41 U/L - 14(L) 15  ALT 0 - 44 U/L - 10 13   8/22 /19 BC- NG 8/23 [facility]   Radiology Reviewed xray of the foot and CXR personally Scattered faint radiopaque densities about the skin and soft tissues subjacent to the first-interspace may be site of puncture wound. No radiographic findings of osteomyelitis  Assessment: 27 y.o. male with h/o testicular cancer diagnosed in sept  2017 s/p left orchidectomys/p 3 cycles of BEP chemo , recurrence in June 2019, started TIP chemo, July had DVT of the left leg with severe venous edema , blistering lesions on his left leg,and underwent percutaneous transluminal angioplasty and mechanical thrombectomy of theleft common femoral, external iliac and common iliac veins and  stent placement of the left common iliac vein and left external iliac vein. Left  leg cellulitis and lesions on his left thigh is  Was recently in [facility] last week to get chemo cycle 3 admitted with fever  and painful swelling left leg X 1 day    Discoid Skin lesions localized  only to  the legs- more on the left and two on the right thigh 1) New discoid painful lesion left calf with surrounding erythema which is now blistering and superficial skin is sloughing  These lesions are atypical and has a broad DD  The current Lesions and the old lesions are target like  and could be  D.D Drug reaction EM Bullous pemphigoid Bullous impetigo Cellulitis  The Timing  after chemo makes me suspect a drug reaction especially paclitaxel and him having severe  venous edema the chemo likely is saturating the leg tissue longer??  Spoke with Dr.Kowalski who thought it was bullous impetigo- He had not seen the pictures I had put in media regarding the evolution of the rash- After discussion and timing of chemo he wondered whether non infectious cause is possible- He would like to see him as OP  Await skin biopsy and cultures ( which may not be positive)    Swelling of the left leg due to DVT from the retroperitoneal LN compressing on his veins-s/p mechanical thrombectomy and stent placement - he then developed blistering circular lesions on his thighs 2 weeks after his first cycle of chemo and has been treated with antibiotics. No cultures were sent form the lesions before Venous edema secondary to extensive Left DVT It is unusual for venous edema /congestion to cause circular lesions.   4) testicular carcinoma s/p left orchidectomy, recurrence of TIP  5) Anemia- Hb 7- will be getting PRBC  6) Thrombocytopenia due to chemo- management as per Dr.Corcoran   7) nail injury thru the shoe of the rt foot- Cover for pseudomonas with cefepime No obvious collection or infection  He could be discharged on augmentin for 5-7 days  Discussed with patient and mother

## 2017-11-09 NOTE — Progress Notes (Signed)
Pharmacy Antibiotic Note  Edgar [name] is a 27 y.o. male admitted on 11/05/2017 with sepsis.  Pharmacy has been consulted for cefepime dosing.  Plan: Per ID note- cefepime 2 gm q12hours + Clindamycin 900 mg q8hours. Possible IV to PO conversion pending biopsy biopsy and cultures.   Height: 6\' 3"  (190.5 cm) Weight: 280 lb 3.2 oz (127.1 kg) IBW/kg (Calculated) : 84.5  Temp (24hrs), Avg:98 F (36.7 C), Min:97.7 F (36.5 C), Max:98.1 F (36.7 C)  Recent Labs  Lab 11/04/17 2340 11/05/17 0943 11/05/17 1055 11/06/17 0531 11/07/17 0541 11/08/17 0442 11/09/17 0429  WBC 7.7 7.4  --  9.3 17.1* 14.8* 8.8  CREATININE 0.88 0.85  --  0.80  --   --   --   LATICACIDVEN 1.0  --  1.1  --   --   --   --     Estimated Creatinine Clearance: 195.6 mL/min (by C-G formula based on SCr of 0.8 mg/dL).    No Known Allergies  Antimicrobials this admission:   Dose adjustments this admission:   Microbiology results:  BCx:   UCx:    Sputum:    MRSA PCR:   Thank you for allowing pharmacy to be a part of this patient's care.  Forrest Moron, Pharm.D., BCPS Clinical Pharmacist 11/09/2017 2:37 PM

## 2017-11-10 ENCOUNTER — Other Ambulatory Visit: Payer: Self-pay | Admitting: *Deleted

## 2017-11-10 DIAGNOSIS — Z8547 Personal history of malignant neoplasm of testis: Secondary | ICD-10-CM

## 2017-11-10 LAB — CULTURE, BLOOD (ROUTINE X 2)
Culture: NO GROWTH
Culture: NO GROWTH
Culture: NO GROWTH
SPECIAL REQUESTS: ADEQUATE
SPECIAL REQUESTS: ADEQUATE

## 2017-11-11 ENCOUNTER — Telehealth: Payer: Self-pay | Admitting: *Deleted

## 2017-11-11 DIAGNOSIS — I82412 Acute embolism and thrombosis of left femoral vein: Secondary | ICD-10-CM | POA: Diagnosis not present

## 2017-11-11 DIAGNOSIS — F329 Major depressive disorder, single episode, unspecified: Secondary | ICD-10-CM | POA: Diagnosis not present

## 2017-11-11 DIAGNOSIS — L982 Febrile neutrophilic dermatosis [Sweet]: Secondary | ICD-10-CM | POA: Diagnosis not present

## 2017-11-11 DIAGNOSIS — D6481 Anemia due to antineoplastic chemotherapy: Secondary | ICD-10-CM | POA: Diagnosis not present

## 2017-11-11 DIAGNOSIS — F321 Major depressive disorder, single episode, moderate: Secondary | ICD-10-CM | POA: Diagnosis not present

## 2017-11-11 DIAGNOSIS — K219 Gastro-esophageal reflux disease without esophagitis: Secondary | ICD-10-CM | POA: Diagnosis not present

## 2017-11-11 DIAGNOSIS — L988 Other specified disorders of the skin and subcutaneous tissue: Secondary | ICD-10-CM | POA: Diagnosis not present

## 2017-11-11 DIAGNOSIS — L03116 Cellulitis of left lower limb: Secondary | ICD-10-CM | POA: Diagnosis not present

## 2017-11-11 DIAGNOSIS — Z792 Long term (current) use of antibiotics: Secondary | ICD-10-CM | POA: Diagnosis not present

## 2017-11-11 DIAGNOSIS — Z7901 Long term (current) use of anticoagulants: Secondary | ICD-10-CM | POA: Diagnosis not present

## 2017-11-11 DIAGNOSIS — C6290 Malignant neoplasm of unspecified testis, unspecified whether descended or undescended: Secondary | ICD-10-CM | POA: Diagnosis not present

## 2017-11-11 DIAGNOSIS — Z79891 Long term (current) use of opiate analgesic: Secondary | ICD-10-CM | POA: Diagnosis not present

## 2017-11-11 DIAGNOSIS — J45909 Unspecified asthma, uncomplicated: Secondary | ICD-10-CM | POA: Diagnosis not present

## 2017-11-11 DIAGNOSIS — I824Y2 Acute embolism and thrombosis of unspecified deep veins of left proximal lower extremity: Secondary | ICD-10-CM | POA: Diagnosis not present

## 2017-11-11 DIAGNOSIS — Z9181 History of falling: Secondary | ICD-10-CM | POA: Diagnosis not present

## 2017-11-11 DIAGNOSIS — D649 Anemia, unspecified: Secondary | ICD-10-CM | POA: Diagnosis not present

## 2017-11-11 DIAGNOSIS — Z452 Encounter for adjustment and management of vascular access device: Secondary | ICD-10-CM | POA: Diagnosis not present

## 2017-11-11 DIAGNOSIS — F17210 Nicotine dependence, cigarettes, uncomplicated: Secondary | ICD-10-CM | POA: Diagnosis not present

## 2017-11-11 NOTE — Telephone Encounter (Signed)
Call returned and order given to resume services by leaving message on voice mail

## 2017-11-11 NOTE — Telephone Encounter (Signed)
  Can you call?  M

## 2017-11-11 NOTE — Telephone Encounter (Signed)
Nurse called asking for orders to resume [place] services please return call to [phone]

## 2017-11-12 ENCOUNTER — Telehealth: Payer: Self-pay | Admitting: *Deleted

## 2017-11-12 ENCOUNTER — Other Ambulatory Visit: Payer: Self-pay | Admitting: Hematology and Oncology

## 2017-11-12 ENCOUNTER — Other Ambulatory Visit: Payer: Self-pay | Admitting: *Deleted

## 2017-11-12 ENCOUNTER — Inpatient Hospital Stay: Payer: BLUE CROSS/BLUE SHIELD

## 2017-11-12 ENCOUNTER — Other Ambulatory Visit: Payer: Self-pay | Admitting: Vascular Surgery

## 2017-11-12 ENCOUNTER — Other Ambulatory Visit (INDEPENDENT_AMBULATORY_CARE_PROVIDER_SITE_OTHER): Payer: Self-pay | Admitting: Vascular Surgery

## 2017-11-12 ENCOUNTER — Ambulatory Visit
Admission: RE | Admit: 2017-11-12 | Discharge: 2017-11-12 | Disposition: A | Discharge status: Home / Self Care | Payer: BLUE CROSS/BLUE SHIELD | Source: Ambulatory Visit | Attending: Hematology and Oncology | Admitting: Hematology and Oncology

## 2017-11-12 ENCOUNTER — Inpatient Hospital Stay (HOSPITAL_BASED_OUTPATIENT_CLINIC_OR_DEPARTMENT_OTHER): Payer: BLUE CROSS/BLUE SHIELD | Admitting: Hematology and Oncology

## 2017-11-12 ENCOUNTER — Other Ambulatory Visit: Payer: Self-pay

## 2017-11-12 ENCOUNTER — Encounter: Payer: Self-pay | Admitting: Hematology and Oncology

## 2017-11-12 VITALS — BP 124/88 | HR 105 | Temp 96.5°F | Resp 18 | Wt 262.6 lb

## 2017-11-12 DIAGNOSIS — C6210 Malignant neoplasm of unspecified descended testis: Secondary | ICD-10-CM

## 2017-11-12 DIAGNOSIS — F329 Major depressive disorder, single episode, unspecified: Secondary | ICD-10-CM | POA: Diagnosis not present

## 2017-11-12 DIAGNOSIS — Z79891 Long term (current) use of opiate analgesic: Secondary | ICD-10-CM | POA: Diagnosis not present

## 2017-11-12 DIAGNOSIS — R229 Localized swelling, mass and lump, unspecified: Secondary | ICD-10-CM

## 2017-11-12 DIAGNOSIS — X58XXXA Exposure to other specified factors, initial encounter: Secondary | ICD-10-CM | POA: Insufficient documentation

## 2017-11-12 DIAGNOSIS — Z5111 Encounter for antineoplastic chemotherapy: Secondary | ICD-10-CM | POA: Diagnosis not present

## 2017-11-12 DIAGNOSIS — A419 Sepsis, unspecified organism: Secondary | ICD-10-CM

## 2017-11-12 DIAGNOSIS — Z792 Long term (current) use of antibiotics: Secondary | ICD-10-CM | POA: Diagnosis not present

## 2017-11-12 DIAGNOSIS — K219 Gastro-esophageal reflux disease without esophagitis: Secondary | ICD-10-CM | POA: Diagnosis not present

## 2017-11-12 DIAGNOSIS — M898X9 Other specified disorders of bone, unspecified site: Secondary | ICD-10-CM | POA: Diagnosis not present

## 2017-11-12 DIAGNOSIS — Z79899 Other long term (current) drug therapy: Secondary | ICD-10-CM

## 2017-11-12 DIAGNOSIS — S70322D Blister (nonthermal), left thigh, subsequent encounter: Secondary | ICD-10-CM

## 2017-11-12 DIAGNOSIS — D6481 Anemia due to antineoplastic chemotherapy: Secondary | ICD-10-CM

## 2017-11-12 DIAGNOSIS — T451X5A Adverse effect of antineoplastic and immunosuppressive drugs, initial encounter: Secondary | ICD-10-CM | POA: Diagnosis not present

## 2017-11-12 DIAGNOSIS — G629 Polyneuropathy, unspecified: Secondary | ICD-10-CM | POA: Diagnosis not present

## 2017-11-12 DIAGNOSIS — J45909 Unspecified asthma, uncomplicated: Secondary | ICD-10-CM

## 2017-11-12 DIAGNOSIS — L03116 Cellulitis of left lower limb: Secondary | ICD-10-CM

## 2017-11-12 DIAGNOSIS — L988 Other specified disorders of the skin and subcutaneous tissue: Secondary | ICD-10-CM | POA: Diagnosis not present

## 2017-11-12 DIAGNOSIS — E876 Hypokalemia: Secondary | ICD-10-CM

## 2017-11-12 DIAGNOSIS — R509 Fever, unspecified: Secondary | ICD-10-CM

## 2017-11-12 DIAGNOSIS — R432 Parageusia: Secondary | ICD-10-CM | POA: Diagnosis not present

## 2017-11-12 DIAGNOSIS — Z809 Family history of malignant neoplasm, unspecified: Secondary | ICD-10-CM

## 2017-11-12 DIAGNOSIS — S91331A Puncture wound without foreign body, right foot, initial encounter: Secondary | ICD-10-CM | POA: Diagnosis not present

## 2017-11-12 DIAGNOSIS — C6212 Malignant neoplasm of descended left testis: Secondary | ICD-10-CM | POA: Diagnosis not present

## 2017-11-12 DIAGNOSIS — L97921 Non-pressure chronic ulcer of unspecified part of left lower leg limited to breakdown of skin: Secondary | ICD-10-CM

## 2017-11-12 DIAGNOSIS — Z86718 Personal history of other venous thrombosis and embolism: Secondary | ICD-10-CM

## 2017-11-12 DIAGNOSIS — F17210 Nicotine dependence, cigarettes, uncomplicated: Secondary | ICD-10-CM | POA: Diagnosis not present

## 2017-11-12 DIAGNOSIS — Z800 Family history of malignant neoplasm of digestive organs: Secondary | ICD-10-CM | POA: Diagnosis not present

## 2017-11-12 DIAGNOSIS — L97929 Non-pressure chronic ulcer of unspecified part of left lower leg with unspecified severity: Secondary | ICD-10-CM | POA: Insufficient documentation

## 2017-11-12 DIAGNOSIS — I82412 Acute embolism and thrombosis of left femoral vein: Secondary | ICD-10-CM | POA: Diagnosis not present

## 2017-11-12 DIAGNOSIS — L03119 Cellulitis of unspecified part of limb: Secondary | ICD-10-CM

## 2017-11-12 DIAGNOSIS — Z803 Family history of malignant neoplasm of breast: Secondary | ICD-10-CM | POA: Diagnosis not present

## 2017-11-12 DIAGNOSIS — Z8547 Personal history of malignant neoplasm of testis: Secondary | ICD-10-CM

## 2017-11-12 DIAGNOSIS — B009 Herpesviral infection, unspecified: Secondary | ICD-10-CM

## 2017-11-12 DIAGNOSIS — C6290 Malignant neoplasm of unspecified testis, unspecified whether descended or undescended: Secondary | ICD-10-CM | POA: Diagnosis not present

## 2017-11-12 DIAGNOSIS — Z9181 History of falling: Secondary | ICD-10-CM | POA: Diagnosis not present

## 2017-11-12 DIAGNOSIS — I824Y2 Acute embolism and thrombosis of unspecified deep veins of left proximal lower extremity: Secondary | ICD-10-CM | POA: Diagnosis not present

## 2017-11-12 DIAGNOSIS — R000 Tachycardia, unspecified: Secondary | ICD-10-CM

## 2017-11-12 DIAGNOSIS — S70321A Blister (nonthermal), right thigh, initial encounter: Secondary | ICD-10-CM | POA: Insufficient documentation

## 2017-11-12 DIAGNOSIS — D649 Anemia, unspecified: Secondary | ICD-10-CM | POA: Diagnosis not present

## 2017-11-12 DIAGNOSIS — W450XXA Nail entering through skin, initial encounter: Secondary | ICD-10-CM

## 2017-11-12 DIAGNOSIS — Z452 Encounter for adjustment and management of vascular access device: Secondary | ICD-10-CM | POA: Diagnosis not present

## 2017-11-12 DIAGNOSIS — Z7901 Long term (current) use of anticoagulants: Secondary | ICD-10-CM | POA: Diagnosis not present

## 2017-11-12 DIAGNOSIS — F321 Major depressive disorder, single episode, moderate: Secondary | ICD-10-CM | POA: Diagnosis not present

## 2017-11-12 DIAGNOSIS — L982 Febrile neutrophilic dermatosis [Sweet]: Secondary | ICD-10-CM | POA: Diagnosis not present

## 2017-11-12 LAB — CBC WITH DIFFERENTIAL/PLATELET
Basophils Absolute: 0.1 10*3/uL (ref 0–0.1)
Basophils Relative: 1 %
Eosinophils Absolute: 0 10*3/uL (ref 0–0.7)
Eosinophils Relative: 0 %
HCT: 25.2 % — ABNORMAL LOW (ref 40.0–52.0)
Hemoglobin: 8.6 g/dL — ABNORMAL LOW (ref 13.0–18.0)
Lymphocytes Relative: 13 %
Lymphs Abs: 1.6 10*3/uL (ref 1.0–3.6)
MCH: 32 pg (ref 26.0–34.0)
MCHC: 34.2 g/dL (ref 32.0–36.0)
MCV: 93.4 fL (ref 80.0–100.0)
Monocytes Absolute: 0.9 10*3/uL (ref 0.2–1.0)
Monocytes Relative: 7 %
Neutro Abs: 9.8 10*3/uL — ABNORMAL HIGH (ref 1.4–6.5)
Neutrophils Relative %: 79 %
Platelets: 404 10*3/uL (ref 150–440)
RBC: 2.7 MIL/uL — ABNORMAL LOW (ref 4.40–5.90)
RDW: 15.8 % — ABNORMAL HIGH (ref 11.5–14.5)
WBC: 12.4 10*3/uL — ABNORMAL HIGH (ref 4.0–10.5)
nRBC: 4 /100 WBC — ABNORMAL HIGH

## 2017-11-12 LAB — BASIC METABOLIC PANEL
Anion gap: 8 (ref 5–15)
BUN: 12 mg/dL (ref 6–20)
CO2: 28 mmol/L (ref 22–32)
Calcium: 9.3 mg/dL (ref 8.9–10.3)
Chloride: 103 mmol/L (ref 98–111)
Creatinine, Ser: 0.71 mg/dL (ref 0.61–1.24)
GFR calc Af Amer: >60 mL/min (ref >60)
GFR calc non Af Amer: >60 mL/min (ref >60)
Glucose, Bld: 101 mg/dL — ABNORMAL HIGH (ref 70–99)
Potassium: 4.3 mmol/L (ref 3.5–5.1)
Sodium: 139 mmol/L (ref 135–145)

## 2017-11-12 LAB — PATHOLOGIST SMEAR REVIEW

## 2017-11-12 LAB — SAMPLE TO BLOOD BANK

## 2017-11-12 LAB — MAGNESIUM: Magnesium: 2 mg/dL (ref 1.7–2.4)

## 2017-11-12 MED ORDER — HEPARIN SOD (PORK) LOCK FLUSH 100 UNIT/ML IV SOLN
500.0000 [IU] | Freq: Once | INTRAVENOUS | Status: AC
  Administered 2017-11-12: 500 [IU] via INTRAVENOUS
  Filled 2017-11-12: qty 5

## 2017-11-12 MED ORDER — OXYCODONE HCL 10 MG PO TABS: 10.0000 mg | ORAL_TABLET | Freq: Three times a day (TID) | ORAL | 0 refills | Status: DC | PRN

## 2017-11-12 MED ORDER — SODIUM CHLORIDE 0.9% FLUSH
10.0000 mL | INTRAVENOUS | Status: DC | PRN
  Administered 2017-11-12: 10 mL via INTRAVENOUS
  Filled 2017-11-12: qty 10

## 2017-11-12 NOTE — Progress Notes (Signed)
Patient here today for follow up from hospital admission.

## 2017-11-12 NOTE — Telephone Encounter (Signed)
Called patient to inform him that his oxycodone has been called.  He is to continue his acyclovir that he was given at the hospital for his lip and finish Augmentin course.

## 2017-11-12 NOTE — Telephone Encounter (Signed)
Attempted to call patient, per his request on home phone as well as cell.  No answer at either number.

## 2017-11-12 NOTE — Progress Notes (Signed)
[facility] day:  11/12/2017  Chief Complaint: CHARLIE SEDA is a 27 y.o. male with recurrent testicular cancer who is seen for assessment on day 23 s/p cycle #2 TIP chemotherapy.  HPI: The patient was last seen in the medical oncology clinic on 11/05/2017.  At that time, he was not feeling well. He was pale, febrile, and tachycardic. He had recently stepped on a nail with his RIGHT foot. Plain films revealed no evidence of developing osteomyelitis. He was treated with IV clindamycin x 1. Exam revealed bilateral lower extremity edema with cellulitic changes noted on the LEFT.  WBC was 7400 ([place] 6000).  Hemoglobin was 7.0, hematocrit 20.5, and platelets 39,000.  Sodium was 132.  Calcium 8.2 (corrected 8.7).  Magnesium was 1.3.  He was admitted to [facility] from 11/05/2017 - 11/09/2017 for cellulitis.  He was developing similar lower extremity skin lesions following his first cycle of chemotherapy.  Xarelto was held as he was thrombocytopenic (nadir platelet count 39,000).  He was treated with heparin.  Lower extremity duplex revealed no clot.  He was treated with Cefepime and vancomycin.  He began oral acyclovir for herpetic labialis.   ID felt his skin lesions were due to drug eruption, erythema multiforme, ecthyma gangrenosum, or nodular vasculitis.  Dermatology was consulted.  Lesions felt to possibly represent bullous impetigo due to staph or strept.  Biopsies with cultures were performed on 11/12/2017.  Patient to be seen in follow-up by dermatology in the outpatient department.  He was discharged on Augmentin for 5-7 days.  During the interim, he denies any new symptoms.  He notes slight decreased movement in his right toes (noted during hospitalization).  The lesions on his right thigh have not progressed.  The skin has cleared.  There remains a tender knot underneath.  He denies any fever.   Past Medical History:  Diagnosis Date  . Asthma    AS A  CHILD-NO INHALERS  . Cancer ([place])    testicular cancer 11/2016 per pt   . DVT (deep venous thrombosis) ([place])   . GERD (gastroesophageal reflux disease)    TUMS PRN    Past Surgical History:  Procedure Laterality Date  . ANKLE FRACTURE SURGERY Right 2004  . FINE NEEDLE ASPIRATION BIOPSY N/A 11/09/2017   Procedure: FINE NEEDLE ASPIRATION BIOPSY;  Surgeon: [name], MD;  Location: [place] [facility];  Service: Cardiovascular;  Laterality: N/A;  . ORCHIECTOMY Left 11/26/2015   Procedure: ORCHIECTOMY;  Surgeon: [name], MD;  Location: [facility] ORS;  Service: Urology;  Laterality: Left;  radical/Inguinal approach  . PERIPHERAL VASCULAR CATHETERIZATION N/A 12/16/2015   Procedure: [name] Cath Insertion;  Surgeon: [name], MD;  Location: [place] [facility];  Service: Cardiovascular;  Laterality: N/A;  . PERIPHERAL VASCULAR THROMBECTOMY Left 10/13/2017   Procedure: PERIPHERAL VASCULAR THROMBECTOMY;  Surgeon: [name], MD;  Location: [place] [facility];  Service: Cardiovascular;  Laterality: Left;  . WISDOM TOOTH EXTRACTION  2017    Family History  Problem Relation Age of Onset  . Skin cancer Mother   . Breast cancer Maternal Grandmother   . Colon cancer Maternal Grandfather   . Diabetes Maternal Grandfather   . Emphysema Father   . Mental illness Sister        anxiety  . Stroke Paternal Grandmother   . Prostate cancer Neg Hx   . Kidney cancer Neg Hx   . Bladder Cancer Neg Hx  Social History:  reports that he has been smoking cigarettes. He has a 4.00 pack-year smoking history. He has quit using smokeless tobacco.  His smokeless tobacco use included snuff. He reports that he has current or past drug history. Drug: Marijuana. He reports that he does not drink alcohol.  He smokes 1/2 pack a day.  He smokes marijuana.  He works in a [employer] in [place] (last worked 08/2017).  He has a 75 year-old-daughter named [name]. The patient's mother's cell phone number is  [phone].  The patient lives in [place]. He is accompanied by his mother today.   Allergies: No Known Allergies  Current Medications: Current Outpatient Medications  Medication Sig Dispense Refill  . acetaminophen (TYLENOL) 500 MG tablet Take 500-1,000 mg by mouth every 6 (six) hours as needed for mild pain or fever.     Marland Kitchen amoxicillin-clavulanate (AUGMENTIN) 875-125 MG tablet Take 1 tablet by mouth 2 (two) times daily for 7 days. 14 tablet 0  . dexamethasone (DECADRON) 4 MG tablet Take 8 mg by mouth daily. Take 8 mg by mouth once daily for three days after chemo to prevent nausea    . DULoxetine (CYMBALTA) 60 MG capsule Take 1 tablet by mouth daily.    Marland Kitchen gabapentin (NEURONTIN) 300 MG capsule Take 1 capsule (300 mg total) by mouth at bedtime. 30 capsule 0  . loratadine (CLARITIN) 10 MG tablet Take 1 tablet (10 mg total) by mouth daily. 90 tablet 1  . magnesium oxide (MAG-OX) 400 MG tablet Take 1 tablet by mouth daily.    . mupirocin cream (BACTROBAN) 2 % Apply topically 3 (three) times daily. 30 g 1  . nicotine (NICODERM CQ - DOSED IN MG/24 HOURS) 21 mg/24hr patch Place 21 mg onto the skin daily.     . ondansetron (ZOFRAN-ODT) 4 MG disintegrating tablet Take 2 tablets by mouth every 8 (eight) hours as needed for nausea.     Marland Kitchen oxyCODONE (OXYCONTIN) 10 mg 12 hr tablet Take 1 tablet (10 mg total) by mouth every 12 (twelve) hours. 10 tablet 0  . oxyCODONE 10 MG TABS Take 1 tablet (10 mg total) by mouth every 8 (eight) hours as needed for severe pain or breakthrough pain. 10 tablet 0  . polyethylene glycol (MIRALAX / GLYCOLAX) packet Take 17 g by mouth daily as needed for mild constipation. 14 each 0  . potassium chloride SA (K-DUR,KLOR-CON) 20 MEQ tablet Take 20 mEq by mouth 2 (two) times daily.    . prochlorperazine (COMPAZINE) 10 MG tablet Take 1 tablet by mouth every 6 (six) hours as needed (nausea/vomiting).     Alveda Reasons 20 MG TABS tablet Take 1 tablet by mouth daily.     No current  facility-administered medications for this visit.    Facility-Administered Medications Ordered in Other Visits  Medication Dose Route Frequency Provider Last Rate Last Dose  . heparin lock flush 100 unit/mL  500 Units Intravenous Once Corcoran, [name], MD      . heparin lock flush 100 unit/mL  500 Units Intravenous Once Corcoran, [name], MD      . sodium chloride flush (NS) 0.9 % injection 10 mL  10 mL Intravenous PRN Nolon Stalls C, MD   10 mL at 10/21/17 1008  . sodium chloride flush (NS) 0.9 % injection 10 mL  10 mL Intravenous PRN [name], MD   10 mL at 11/12/17 0827    Review of Systems  Constitutional: Positive for malaise/fatigue. Negative for  chills, diaphoresis, fever and weight loss.       Feels "ok".  HENT: Positive for tinnitus (slight - audiogram normal). Negative for congestion, ear discharge, ear pain, hearing loss, nosebleeds and sore throat.        Dysgeusia  Eyes: Negative.  Negative for blurred vision, double vision, photophobia, pain and discharge.  Respiratory: Positive for shortness of breath (exertional). Negative for cough, hemoptysis and sputum production.   Cardiovascular: Positive for leg swelling (BILATERAL, improved). Negative for chest pain, palpitations, orthopnea and PND.  Gastrointestinal: Negative.  Negative for abdominal pain, blood in stool, constipation, diarrhea, heartburn, melena, nausea and vomiting.  Genitourinary: Negative.  Negative for dysuria, frequency, hematuria and urgency.  Musculoskeletal: Negative.  Negative for back pain, falls, joint pain and myalgias.  Skin: Negative for itching and rash.       Skin changes to right thigh improved; underlying knots.  Neurological: Positive for sensory change (minimal neuropathy to hands and toes), focal weakness (slight decreased right toe movement) and weakness (generalized). Negative for dizziness, tremors and headaches.  Endo/Heme/Allergies: Bruises/bleeds easily.   Psychiatric/Behavioral: Negative for depression, memory loss and suicidal ideas. The patient is not nervous/anxious and does not have insomnia.   All other systems reviewed and are negative.  Performance status (ECOG): 2 - Symptomatic, <50% confined to bed  Vital Signs BP 124/88 (BP Location: Left Arm, Patient Position: Sitting)   Pulse (!) 105   Temp (!) 96.5 F (35.8 C) (Tympanic)   Resp 18   Wt 262 lb 9 oz (119.1 kg)   BMI 32.82 kg/m   Physical Exam  Constitutional: He is oriented to person, place, and time and well-developed, well-nourished, and in no distress. He appears unhealthy. He has a sickly appearance.  HENT:  Head: Normocephalic and atraumatic. Hair is abnormal (chemotherapy induced alopecia).  Alopecia.  Eyes: Pupils are equal, round, and reactive to light. Conjunctivae and EOM are normal. No scleral icterus.  Brown eyes  Neck: Normal range of motion. Neck supple.  Cardiovascular: Regular rhythm, normal heart sounds, intact distal pulses and normal pulses. Exam reveals no gallop.  No murmur heard. Pulmonary/Chest: Effort normal. No respiratory distress. He has no wheezes. He has no rales.  Abdominal: Soft. He exhibits no distension and no mass. There is no tenderness.  Musculoskeletal: Normal range of motion. He exhibits edema (left thigh edema, improved) and tenderness (right thigh "knots" tender).  Lymphadenopathy:    He has no cervical adenopathy.    He has no axillary adenopathy.       Right: No supraclavicular adenopathy present.       Left: No supraclavicular adenopathy present.  Neurological: He is alert and oriented to person, place, and time. Gait normal.  Can wiggle right toes  Skin: Skin is warm and dry. Lesion (6 circular lesions to LEFT inner and posterior thigh-healing) and rash (right thigh with clear skin and < fingertip tender nodules.  Herpetic upper lip lesion-drying) noted. No bruising noted. No erythema. No pallor.  #1: 5 circular lesions to  LEFT inner thigh, and 1 circular lesions to LEFT posterior thigh - hyperpigmented and healing.  #2: 1 circular lesion healing at top of LLE dressing.  #3: Puncture wound to the dorsal aspect of RIGHT foot well healed.    Psychiatric: Mood, affect and judgment normal.  Nursing note and vitals reviewed.  Imaging studies: 11/28/2015:  Chest, abdomen, and pelvic CT revealed no mediastinal adenopathy or suspicious pulmonary nodules.   There were 2 prominent left periaortic retroperitoneal  lymph nodes (1.7 cm and 0.9 cm) concerning for testicular nodal metastasis.  There were no abnormal lymph nodes above the renal veins.  There were postsurgical change in the left hemiscrotum and left inguinal canal.  There was a 3.3 cm soft tissue abnormality anterior to the left iliopsoas muscle which could represent a metastatic lymph node (N2 lesion).  12/17/2015:  Head MRI revealed no evidence of metastatic disease.   03/30/2016:  Chest, abdomen, and pelvic CT revealed interval resolution of left abdominal/pelvic lymphadenopathy.  There was no residual metastatic disease. 09/30/2016:  Chest, abdomen, and pelvic CT revealed no evidence of recurrent or metastatic disease.  09/10/2017:  Bilateral lower extremity duplex revealed evidence of obstruction in the left CFV, SFJ, and proximal femoral vein.  09/10/2017:  CT pelvic venogram revealed multiple retroperitoneal masses including large mass within the left iliacus concerning for metastatic disease in the setting of prior testicular cancer.  Mass in the left iliacus caused significant narrowing of the proximal left external iliac vein. There was filling defect within the distal left external iliac vein extending into the common femoral and proximal superficial femoral veins consistent with thrombus  09/11/2017:  Chest, abdomen, and pelvic CT revealed enlarged and morphologically abnormal 4.3 x 4.9 cm left pelvic sidewall and retroperitoneal lymph nodes (measuring up to  2.9 cm) concerning for metastatic disease given history of testicular cancer. Biopsy was suggested for confirmation. Prominent left inguinal lymph nodes could be metastatic or reactive in nature.  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  There was anasarca/swelling of the left lower extremity.  There was no evidence of metastatic disease in the chest. 09/13/2017:  Head MRI revealed no evidence of metastatic disease.   Infusion on 11/12/2017  Component Date Value Ref Range Status  . Blood Bank Specimen 11/12/2017 SAMPLE AVAILABLE FOR TESTING   Final  . Sample Expiration 11/12/2017    Final                   Value:11/15/2017 Performed at [facility], [address] [place], [place] [address]   . Magnesium 11/12/2017 2.0  1.7 - 2.4 mg/dL Final   Performed at [facility], [address] [place], [place] [address]  . WBC 11/12/2017 12.4* 4.0 - 10.5 K/uL Final  . RBC 11/12/2017 2.70* 4.40 - 5.90 MIL/uL Final  . Hemoglobin 11/12/2017 8.6* 13.0 - 18.0 g/dL Final  . HCT 11/12/2017 25.2* 40.0 - 52.0 % Final  . MCV 11/12/2017 93.4  80.0 - 100.0 fL Final  . MCH 11/12/2017 32.0  26.0 - 34.0 pg Final  . MCHC 11/12/2017 34.2  32.0 - 36.0 g/dL Final  . RDW 11/12/2017 15.8* 11.5 - 14.5 % Final  . Platelets 11/12/2017 404  150 - 440 K/uL Final  . Neutrophils Relative % 11/12/2017 79  % Final  . Lymphocytes Relative 11/12/2017 13  % Final  . Monocytes Relative 11/12/2017 7  % Final  . Eosinophils Relative 11/12/2017 0  % Final  . Basophils Relative 11/12/2017 1  % Final  . nRBC 11/12/2017 4* 0 /100 WBC Final  . Neutro Abs 11/12/2017 9.8* 1.4 - 6.5 K/uL Final  . Lymphs Abs 11/12/2017 1.6  1.0 - 3.6 K/uL Final  . Monocytes Absolute 11/12/2017 0.9  0.2 - 1.0 K/uL Final  . Eosinophils Absolute 11/12/2017 0.0  0 - 0.7 K/uL Final  . Basophils Absolute 11/12/2017 0.1  0 - 0.1 K/uL Final  .  RBC Morphology 11/12/2017  MIXED RBC POPULATION   Final   POLYCHROMASIA PRESENT  . Smear Review 11/12/2017 CONSISTANT WITH UDENYCA ADMIN ON 10/27/2017   Final   Comment: PATH REVIEW HAS BEEN ORDERED ON THIS ACCESSION NUMBER PLATELETS APPEAR ADEQUATE Performed at [facility], [address], [place], [place] [id number]   . Sodium 11/12/2017 139  135 - 145 mmol/L Final  . Potassium 11/12/2017 4.3  3.5 - 5.1 mmol/L Final  . Chloride 11/12/2017 103  98 - 111 mmol/L Final  . CO2 11/12/2017 28  22 - 32 mmol/L Final  . Glucose, Bld 11/12/2017 101* 70 - 99 mg/dL Final  . BUN 11/12/2017 12  6 - 20 mg/dL Final  . Creatinine, Ser 11/12/2017 0.71  0.61 - 1.24 mg/dL Final  . Calcium 11/12/2017 9.3  8.9 - 10.3 mg/dL Final  . GFR calc non Af Amer 11/12/2017 >60  >60 mL/min Final  . GFR calc Af Amer 11/12/2017 >60  >60 mL/min Final   Comment: (NOTE) The eGFR has been calculated using the CKD EPI equation. This calculation has not been validated in all clinical situations. eGFR's persistently <60 mL/min signify possible Chronic Kidney Disease.   Georgiann Hahn gap 11/12/2017 8  5 - 15 Final   Performed at [facility], [address] [place], [place] [address]    Assessment:  Edgar [name] is a 27 y.o. male with recurrent testicular cancer.  He presented with a left lower extremity DVT on 09/10/2017.  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  Clinical/pathologic stage is T1 N1-2 S0 M0.  He received 3 cycles of  BEP (12/30/2015 - 02/24/2016).  He received OnPro Neulasta with cycle #2 and #3.  He declined evaluation for retroperitoneal lymph node dissection (RPLND).  He declined sperm banking.  Pre surgery labs on 11/20/2015 revealed an AFP of 411.9, beta-HCG 2,669, and LDH 522 (121-224).    Tumor  markers have been followed:   AFP was 411.9 (0-8.3) on 11/20/2015, 11.2 on 12/19/2015, 6.3 on 12/23/2015, 1.3 on 01/27/2016, 1.8 on 02/17/2016, 1.9 on 03/30/2016, 1.4 on 05/27/2016, 1.0 on 07/29/2016, 1.4 on 10/14/2016, 3.3 on 12/24/2016, 1 on 09/10/2017, 1 on 09/20/2017, and 2.6 on 10/21/2017.    Beta-HCG was 2,669 (0-3) on 11/20/2015, 2 on 12/19/2015, 1 on 12/23/2015, < 1 on 01/27/2016, < 1 on 02/17/2016, < 1 on 03/30/2016, < 1 on 05/27/2016,  < 1 on 07/29/2016, < 1 on 10/14/2016, and 3 on 12/24/2016, < 5 on 09/11/2017,  < 5 on 09/20/2017, and < 1 on 10/21/2017.  LDH was 522 (98-192) on 11/20/2015, 179 on 12/19/2015, 160 on 12/23/2015, 202 on 01/27/2016, 156 on 02/10/2016, 161 on 02/17/2016, 172 on 03/30/2016, 178 on 05/27/2016, 150 on 07/29/2016, 152 on 10/14/2016, 145 on 12/24/2016, 1557 on 09/11/2017, 916 on 09/20/2017, and 144 on 10/21/2017.  Chest, abdomen, and pelvic CT on 09/11/2017 revealed enlarged and morphologically abnormal 4.3 x 4.9 cm left pelvic sidewall and retroperitoneal lymph nodes (measuring up to 2.9 cm).  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  There was anasarca/swelling of the left lower extremity.  There was no evidence of metastatic disease in the chest.  Head MRI on 09/13/2017 revealed no evidence of metastatic disease.   He has  a left lower extremity DVT.  Bilateral lower extremity duplex on 09/10/2017 revealed evidence of obstruction in the left CFV, SFJ, and proximal femoral vein.  He is on Xarelto.  He is currently day 23 s/p cycle #2 TIP chemotherapy (09/21/2017 - 10/21/2017) with Margarette Canada support. Cycle #1 was administered at [facility]. Cycle #2 was administered at [facility].  Both cycles were complicated by lower extremity skin lesions on recovery of counts.  Audiogram on 10/27/2017 revealed hearing within normal limits.  He was diagnosed with cellulitis of the left lower extremity on 10/06/2017.  He was  on clindamycin.  While hospitalized (10/08/2017 - 10/16/2017), he received 9 days of vancomycin and Cefepime.  He was discharged on Flagyl, doxycycline, and Levaquin.  He was admitted to [facility] from 10/08/2017 - 10/16/2017 with left lower extremity cellulitis.  He was treated with Cefepime and vancomycin.  Imaging revealed significant improvement in adenopathy with decreased pelvic sidewall disease and decreased bulk at aortic bifurcation.  He was felt to have venous congestion syndrome with skin lesions due to congestion.   He was admitted to [facility] oncology floor from 10/21/2017 - 10/26/2017 for inpatient chemotherapy (cycle #2 TIP).  Was seen in consult by Dr. Tsosie Billing (infectious disease) who felt the patient had been treated adequately with antibiotic coverage and not require follow-up further treatment for his left lower extremity cellulitis.  CBC on the day of discharge revealed a WBC 4700 ([place] 4600).  Hemoglobin 10.0, hematocrit 28.0, MCV 91.4, and platelets 371,000.    He was admitted to [facility] from 11/05/2017 - 11/09/2017 for cellulitis. He was treated with Cefepime and vancomycin.  He began oral acyclovir for herpetic labialis.  He underwent skin biopsy.  Cultures were negative.  He was admitted to [facility] from 11/05/2017 - 11/09/2017 for cellulitis.  He was developing similar lower extremity skin lesions following his first cycle of chemotherapy.  Xarelto was held as he was thrombocytopenic (nadir platelet count 39,000).  He was treated with heparin.  Lower extremity duplex revealed no clot.  He was treated with Cefepime and vancomycin.  He was discharged on Augmentin.  He began oral acyclovir for herpetic labialis.  He underwent IVC filterplacement, percutaneous angioplastyand stent placementof the left common iliac vein and left external iliac vein on 10/13/2017.  He was discharged on Flagyl, doxycycline, and Levaquin.  Hypercoagulable work-up on 10/12/2017 negative for the  following: Factor V Leiden, prothrombin gene mutation, lupus anticoagulant, anticardiolipin antibodies, beta-2 glycoprotein, protein C activity/antigen, protein S activity/antigen. ATIII was 54% (75-120) on heparin.  Symptomatically, he is doing well.  Skin lesions are healing.  Right thigh skin lesions failed to progress and now have a central tender nodule.  Hemoglobin is 8.6.  Platelets are 404,000,  WBC is 12,400.  Creatinine is 0.71.  Magnesium is 2.0.  Plan: 1. Labs today:  CBC with diff, BMP, Mg, hold tube. 2. Recurrent testicular cancer - treatment ongoing Counts have recovered. Discuss plan for cycle #3 TIP chemotherapy beginning 11/16/2017. 3. Cellulitis and skin lesions -  Cellulitis has resolved. Continue Augmentin to complete course. Right thigh ultrasound today and possible aspiration of nodular lesions. Continue wound care for skin lesions. Follow-up skin biopsies- contact Dr. Reuel Derby and Dr Nehemiah Massed. Discuss with ID consideration of prophylactic antibiotics to prevent skin flare of bullous impetigo due to staph or strept. 4. Chemotherapy induced anemia - ongoing Labs reviewed.  Hemoglobin improving. No need for transfusion. 5. Electrolyte derangements - ongoing Electrolytes stable on ongoing supplementation. Continue magnesium oxide and potassium chloride.  6. Herpetic lip lesion - improving Continue acyclovir. 7. Left lower extremity DVT- chronic  Continue  Xarelto  s/p stent placement with patency documented on 11/05/2017 imaging. 8. Soft tissue ultrasound and +/- aspiration today.  Spoke with radiology. 9. RTC on 11/16/2017 for MD assessment, labs (CBC with diff, CMP, Mg, LDH, AFP, beta-HCG) and inpatient admission for cycle #3 TIP chemotherapy.   A total of (> 25) minutes of face-to-face time was spent with the patient with greater than 50% of that time in counseling and care-coordination.    [name], MD  11/12/2017, 9:28 AM

## 2017-11-12 NOTE — Telephone Encounter (Signed)
Patient called requesting that Rodena Piety return his call [phone]

## 2017-11-14 LAB — AEROBIC/ANAEROBIC CULTURE W GRAM STAIN (SURGICAL/DEEP WOUND): Culture: NO GROWTH

## 2017-11-14 LAB — AEROBIC/ANAEROBIC CULTURE (SURGICAL/DEEP WOUND)

## 2017-11-16 ENCOUNTER — Encounter: Payer: Self-pay | Admitting: Hematology and Oncology

## 2017-11-16 ENCOUNTER — Telehealth: Payer: Self-pay | Admitting: *Deleted

## 2017-11-16 ENCOUNTER — Inpatient Hospital Stay (HOSPITAL_BASED_OUTPATIENT_CLINIC_OR_DEPARTMENT_OTHER): Payer: BLUE CROSS/BLUE SHIELD | Admitting: Hematology and Oncology

## 2017-11-16 ENCOUNTER — Inpatient Hospital Stay
Admission: AD | Admit: 2017-11-16 | Payer: BLUE CROSS/BLUE SHIELD | Source: Ambulatory Visit | Admitting: Hematology and Oncology

## 2017-11-16 ENCOUNTER — Other Ambulatory Visit: Payer: Self-pay

## 2017-11-16 ENCOUNTER — Inpatient Hospital Stay: Payer: BLUE CROSS/BLUE SHIELD | Attending: Hematology and Oncology

## 2017-11-16 VITALS — BP 137/90 | HR 90 | Temp 96.7°F | Resp 18 | Wt 265.1 lb

## 2017-11-16 DIAGNOSIS — D6481 Anemia due to antineoplastic chemotherapy: Secondary | ICD-10-CM

## 2017-11-16 DIAGNOSIS — S99921S Unspecified injury of right foot, sequela: Secondary | ICD-10-CM

## 2017-11-16 DIAGNOSIS — Z79899 Other long term (current) drug therapy: Secondary | ICD-10-CM

## 2017-11-16 DIAGNOSIS — K0889 Other specified disorders of teeth and supporting structures: Secondary | ICD-10-CM

## 2017-11-16 DIAGNOSIS — Z803 Family history of malignant neoplasm of breast: Secondary | ICD-10-CM | POA: Diagnosis not present

## 2017-11-16 DIAGNOSIS — Z7901 Long term (current) use of anticoagulants: Secondary | ICD-10-CM | POA: Insufficient documentation

## 2017-11-16 DIAGNOSIS — C6210 Malignant neoplasm of unspecified descended testis: Secondary | ICD-10-CM

## 2017-11-16 DIAGNOSIS — K047 Periapical abscess without sinus: Secondary | ICD-10-CM | POA: Insufficient documentation

## 2017-11-16 DIAGNOSIS — K219 Gastro-esophageal reflux disease without esophagitis: Secondary | ICD-10-CM

## 2017-11-16 DIAGNOSIS — Z808 Family history of malignant neoplasm of other organs or systems: Secondary | ICD-10-CM | POA: Diagnosis not present

## 2017-11-16 DIAGNOSIS — C6212 Malignant neoplasm of descended left testis: Secondary | ICD-10-CM | POA: Diagnosis not present

## 2017-11-16 DIAGNOSIS — S91341D Puncture wound with foreign body, right foot, subsequent encounter: Secondary | ICD-10-CM | POA: Insufficient documentation

## 2017-11-16 DIAGNOSIS — Z7689 Persons encountering health services in other specified circumstances: Secondary | ICD-10-CM | POA: Insufficient documentation

## 2017-11-16 DIAGNOSIS — R509 Fever, unspecified: Secondary | ICD-10-CM | POA: Insufficient documentation

## 2017-11-16 DIAGNOSIS — B001 Herpesviral vesicular dermatitis: Secondary | ICD-10-CM | POA: Insufficient documentation

## 2017-11-16 DIAGNOSIS — M79671 Pain in right foot: Secondary | ICD-10-CM

## 2017-11-16 DIAGNOSIS — D61810 Antineoplastic chemotherapy induced pancytopenia: Secondary | ICD-10-CM | POA: Insufficient documentation

## 2017-11-16 DIAGNOSIS — C792 Secondary malignant neoplasm of skin: Secondary | ICD-10-CM

## 2017-11-16 DIAGNOSIS — I82592 Chronic embolism and thrombosis of other specified deep vein of left lower extremity: Secondary | ICD-10-CM | POA: Diagnosis not present

## 2017-11-16 DIAGNOSIS — R000 Tachycardia, unspecified: Secondary | ICD-10-CM | POA: Insufficient documentation

## 2017-11-16 DIAGNOSIS — F17210 Nicotine dependence, cigarettes, uncomplicated: Secondary | ICD-10-CM | POA: Insufficient documentation

## 2017-11-16 DIAGNOSIS — L309 Dermatitis, unspecified: Secondary | ICD-10-CM | POA: Diagnosis not present

## 2017-11-16 DIAGNOSIS — T451X5A Adverse effect of antineoplastic and immunosuppressive drugs, initial encounter: Secondary | ICD-10-CM | POA: Diagnosis not present

## 2017-11-16 DIAGNOSIS — Z800 Family history of malignant neoplasm of digestive organs: Secondary | ICD-10-CM | POA: Insufficient documentation

## 2017-11-16 DIAGNOSIS — G620 Drug-induced polyneuropathy: Secondary | ICD-10-CM | POA: Diagnosis not present

## 2017-11-16 LAB — COMPREHENSIVE METABOLIC PANEL
ALT: 14 U/L (ref 0–44)
AST: 18 U/L (ref 15–41)
Albumin: 3.8 g/dL (ref 3.5–5.0)
Alkaline Phosphatase: 52 U/L (ref 38–126)
Anion gap: 10 (ref 5–15)
BUN: 14 mg/dL (ref 6–20)
CO2: 26 mmol/L (ref 22–32)
Calcium: 9.2 mg/dL (ref 8.9–10.3)
Chloride: 103 mmol/L (ref 98–111)
Creatinine, Ser: 0.67 mg/dL (ref 0.61–1.24)
GFR calc Af Amer: >60 mL/min (ref >60)
GFR calc non Af Amer: >60 mL/min (ref >60)
Glucose, Bld: 119 mg/dL — ABNORMAL HIGH (ref 70–99)
Potassium: 4.2 mmol/L (ref 3.5–5.1)
Sodium: 139 mmol/L (ref 135–145)
Total Bilirubin: 0.4 mg/dL (ref 0.3–1.2)
Total Protein: 7.1 g/dL (ref 6.5–8.1)

## 2017-11-16 LAB — CBC WITH DIFFERENTIAL/PLATELET
Basophils Absolute: 0.1 10*3/uL (ref 0–0.1)
Basophils Relative: 1 %
Eosinophils Absolute: 0 10*3/uL (ref 0–0.7)
Eosinophils Relative: 0 %
HCT: 27.5 % — ABNORMAL LOW (ref 40.0–52.0)
Hemoglobin: 9 g/dL — ABNORMAL LOW (ref 13.0–18.0)
Lymphocytes Relative: 18 %
Lymphs Abs: 0.9 10*3/uL — ABNORMAL LOW (ref 1.0–3.6)
MCH: 31.4 pg (ref 26.0–34.0)
MCHC: 32.9 g/dL (ref 32.0–36.0)
MCV: 95.6 fL (ref 80.0–100.0)
Monocytes Absolute: 1 10*3/uL (ref 0.2–1.0)
Monocytes Relative: 20 %
Neutro Abs: 3.1 10*3/uL (ref 1.4–6.5)
Neutrophils Relative %: 61 %
Platelets: 490 10*3/uL — ABNORMAL HIGH (ref 150–440)
RBC: 2.88 MIL/uL — ABNORMAL LOW (ref 4.40–5.90)
RDW: 18.9 % — ABNORMAL HIGH (ref 11.5–14.5)
WBC: 5.2 10*3/uL (ref 3.8–10.6)

## 2017-11-16 LAB — LACTATE DEHYDROGENASE: LDH: 158 U/L (ref 98–192)

## 2017-11-16 LAB — MAGNESIUM: Magnesium: 2 mg/dL (ref 1.7–2.4)

## 2017-11-16 MED ORDER — HEPARIN SOD (PORK) LOCK FLUSH 100 UNIT/ML IV SOLN
500.0000 [IU] | Freq: Once | INTRAVENOUS | Status: AC
  Administered 2017-11-16: 500 [IU] via INTRAVENOUS

## 2017-11-16 MED ORDER — SODIUM CHLORIDE 0.9% FLUSH
10.0000 mL | Freq: Once | INTRAVENOUS | Status: AC
  Administered 2017-11-16: 10 mL via INTRAVENOUS
  Filled 2017-11-16: qty 10

## 2017-11-16 NOTE — Progress Notes (Signed)
[name]  Clinic day:  11/16/2017  Chief Complaint: Edgar [name] is a 27 y.o. male with recurrent testicular cancer who is seen for assessment prior to cycle #3 TIP chemotherapy.  HPI: The patient was last seen in the medical oncology clinic on 11/12/2017.  At that time, he was doing well.  Skin lesions were healing.  Right thigh skin lesions failed to progress and had a central tender nodule.  Hemoglobin was 8.6.  Platelets were 404,000,  WBC was 12,400.  Creatinine was 0.71.  Magnesium was 2.0.  He was on Augmentin for cellulitis.  He was supposed to be on acyclovir for herpetic labialis (not at pharmacy).  He underwent right soft tissue ultrasound of the right thigh on 11/12/2017.  There was no sonographic correlate for the areas of concern.  There was no definable/drainable fluid collection.  During the interim, he notes pain.  He has right foot pain and swelling which is impeding his ability to walk.  He can move his toes, but it causes pain.  Pain is sharp and throbbing.  He denies any change in skin lesions.  He continues to take Augmentin.  He has not been on acyclovir.  His herpetic lip lesion is slowly fading.  He also notes a 2 day history of painful right sided lower teeth.  He has not been to a dentist.  He denies any fever.  He also notes a new rash on his arms (left > right) which may be related to a new soap.   Past Medical History:  Diagnosis Date  . Asthma    AS A CHILD-NO INHALERS  . Cancer (HCC)    testicular cancer 11/2016 per pt   . DVT (deep venous thrombosis) (HCC)   . GERD (gastroesophageal reflux disease)    TUMS PRN    Past Surgical History:  Procedure Laterality Date  . ANKLE FRACTURE SURGERY Right 2004  . FINE NEEDLE ASPIRATION BIOPSY N/A 11/09/2017   Procedure: FINE NEEDLE ASPIRATION BIOPSY;  Surgeon: [name], Gregory G, MD;  Location: [facility] INVASIVE CV LAB;  Service: Cardiovascular;  Laterality: N/A;  . ORCHIECTOMY  Left 11/26/2015   Procedure: ORCHIECTOMY;  Surgeon: Ashley Brandon, MD;  Location: [facility] ORS;  Service: Urology;  Laterality: Left;  radical/Inguinal approach  . PERIPHERAL VASCULAR CATHETERIZATION N/A 12/16/2015   Procedure: Porta Cath Insertion;  Surgeon: Jason S Dew, MD;  Location: [facility] INVASIVE CV LAB;  Service: Cardiovascular;  Laterality: N/A;  . PERIPHERAL VASCULAR THROMBECTOMY Left 10/13/2017   Procedure: PERIPHERAL VASCULAR THROMBECTOMY;  Surgeon: [name], Gregory G, MD;  Location: [facility] INVASIVE CV LAB;  Service: Cardiovascular;  Laterality: Left;  . WISDOM TOOTH EXTRACTION  2017    Family History  Problem Relation Age of Onset  . Skin cancer Mother   . Breast cancer Maternal Grandmother   . Colon cancer Maternal Grandfather   . Diabetes Maternal Grandfather   . Emphysema Father   . Mental illness Sister        anxiety  . Stroke Paternal Grandmother   . Prostate cancer Neg Hx   . Kidney cancer Neg Hx   . Bladder Cancer Neg Hx     Social History:  reports that he has been smoking cigarettes. He has a 4.00 pack-year smoking history. He has quit using smokeless tobacco.  His smokeless tobacco use included snuff. He reports that he has current or past drug history. Drug: Marijuana. He reports that he does not drink alcohol.  He   smokes 1/2 pack a day.  He smokes marijuana.  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  He has a 7 year-old-daughter named Edgar [name]. The patient's mother's cell phone number is [phone].  The patient lives in [place]. He is accompanied by his mother today.   Allergies: No Known Allergies  Current Medications: Current Outpatient Medications  Medication Sig Dispense Refill  . amoxicillin-clavulanate (AUGMENTIN) 875-125 MG tablet Take 1 tablet by mouth 2 (two) times daily for 7 days. 14 tablet 0  . gabapentin (NEURONTIN) 300 MG capsule Take 1 capsule (300 mg total) by mouth at bedtime. 30 capsule 0  . loratadine (CLARITIN) 10 MG tablet Take  1 tablet (10 mg total) by mouth daily. 90 tablet 1  . magnesium oxide (MAG-OX) 400 MG tablet Take 1 tablet by mouth daily.    . mupirocin cream (BACTROBAN) 2 % Apply topically 3 (three) times daily. 30 g 1  . nicotine (NICODERM CQ - DOSED IN MG/24 HOURS) 21 mg/24hr patch Place 21 mg onto the skin daily.     . oxyCODONE (OXYCONTIN) 10 mg 12 hr tablet Take 1 tablet (10 mg total) by mouth every 12 (twelve) hours. 10 tablet 0  . Oxycodone HCl 10 MG TABS Take 1 tablet (10 mg total) by mouth every 8 (eight) hours as needed. 20 tablet 0  . potassium chloride SA (K-DUR,KLOR-CON) 20 MEQ tablet Take 20 mEq by mouth 2 (two) times daily.    . XARELTO 20 MG TABS tablet Take 1 tablet by mouth daily.    . acetaminophen (TYLENOL) 500 MG tablet Take 500-1,000 mg by mouth every 6 (six) hours as needed for mild pain or fever.     . dexamethasone (DECADRON) 4 MG tablet Take 8 mg by mouth daily. Take 8 mg by mouth once daily for three days after chemo to prevent nausea    . DULoxetine (CYMBALTA) 60 MG capsule Take 1 tablet by mouth daily.    . ondansetron (ZOFRAN-ODT) 4 MG disintegrating tablet Take 2 tablets by mouth every 8 (eight) hours as needed for nausea.     . prochlorperazine (COMPAZINE) 10 MG tablet Take 1 tablet by mouth every 6 (six) hours as needed (nausea/vomiting).      No current facility-administered medications for this visit.    Facility-Administered Medications Ordered in Other Visits  Medication Dose Route Frequency Provider Last Rate Last Dose  . heparin lock flush 100 unit/mL  500 Units Intravenous Once Corcoran, [name], MD      . heparin lock flush 100 unit/mL  500 Units Intravenous Once Corcoran, [name], MD      . sodium chloride flush (NS) 0.9 % injection 10 mL  10 mL Intravenous PRN Corcoran, [name], MD   10 mL at 10/21/17 1008    Review of Systems  Constitutional: Positive for malaise/fatigue. Negative for chills, diaphoresis, fever and weight loss (up 3 pounds).       Feels  uncomfortable secondary to pain.  HENT: Negative for congestion, ear discharge, ear pain, hearing loss, nosebleeds, sinus pain, sore throat and tinnitus.        Dysgeusia.  Painful right lower teeth.  Eyes: Negative.  Negative for blurred vision, double vision, photophobia, pain and discharge.  Respiratory: Positive for shortness of breath (exertional). Negative for cough, hemoptysis, sputum production and wheezing.   Cardiovascular: Positive for leg swelling (left thigh and right foot). Negative for chest pain, palpitations, orthopnea and PND.  Gastrointestinal: Negative.  Negative   for abdominal pain, blood in stool, constipation, diarrhea, heartburn, melena, nausea and vomiting.  Genitourinary: Negative.  Negative for dysuria, frequency, hematuria and urgency.  Musculoskeletal: Negative for back pain, falls, joint pain, myalgias and neck pain.       Right foot pain affecting gait.  Skin: Positive for rash. Negative for itching.       Rash upper extremities R> L  Neurological: Positive for focal weakness (pain limiting factor to toe mobility) and weakness (generalized- improved). Negative for dizziness, tingling, tremors, sensory change, speech change and headaches.  Endo/Heme/Allergies: Bruises/bleeds easily.  Psychiatric/Behavioral: Negative for depression, hallucinations and memory loss. The patient is not nervous/anxious and does not have insomnia.   All other systems reviewed and are negative.  Performance status (ECOG): 2 - Symptomatic, <50% confined to bed  Vital Signs BP 137/90 (BP Location: Left Arm, Patient Position: Sitting)   Pulse 90   Temp (!) 96.7 F (35.9 C) (Tympanic)   Resp 18   Wt 265 lb 1.6 oz (120.2 kg)   BMI 33.14 kg/m   Physical Exam  Constitutional: He is oriented to person, place, and time and well-developed, well-nourished, and in no distress. He appears unhealthy. He has a sickly appearance.  HENT:  Head: Normocephalic and atraumatic. Hair is normal.   Nose: Nose normal.  Mouth/Throat: Oropharynx is clear and moist. No oropharyngeal exudate.  Alopecia.  Poor dentition.  Right sided lower dental pain on tapping and bite.  Eyes: Conjunctivae and EOM are normal. No scleral icterus.  Brown eyes.  Neck: Normal range of motion. Neck supple.  Cardiovascular: Normal rate, regular rhythm, normal heart sounds, intact distal pulses and normal pulses. Exam reveals no gallop.  No murmur heard. Pulmonary/Chest: Effort normal. No respiratory distress. He has no wheezes. He has no rales.  Abdominal: Soft. He exhibits no distension and no mass. There is no tenderness.  Musculoskeletal: Normal range of motion. He exhibits edema (left thigh edema, improved) and tenderness (one right thigh "knot" tender with no overlying skin changes.  Second thigh lesion, resolved.).  Edema in right midfoot distally.  Tender to palpation.  Puncture site tender, new, without erythema.  Lymphadenopathy:    He has no cervical adenopathy.    He has no axillary adenopathy.       Right: No supraclavicular adenopathy present.       Left: No supraclavicular adenopathy present.  Neurological: He is alert and oriented to person, place, and time. Gait normal.  Can wiggle right toes  Skin: Skin is warm and dry. Lesion (6 circular lesions to LEFT inner and posterior thigh-healing) and rash (Sandpaper like rash left upper extremity.  Herpetic upper lip lesion, smaller.) noted. No bruising noted. No erythema. No pallor.  #1: 5 circular lesions to LEFT inner thigh, and 1 circular lesions to LEFT posterior thigh - hyperpigmented and healing.  #2: 1 circular lesion healing at top of LLE dressing.   Psychiatric: Mood, affect and judgment normal.  Nursing note and vitals reviewed.  Imaging studies: 11/28/2015:  Chest, abdomen, and pelvic CT revealed no mediastinal adenopathy or suspicious pulmonary nodules.   There were 2 prominent left periaortic retroperitoneal lymph nodes (1.7 cm and 0.9  cm) concerning for testicular nodal metastasis.  There were no abnormal lymph nodes above the renal veins.  There were postsurgical change in the left hemiscrotum and left inguinal canal.  There was a 3.3 cm soft tissue abnormality anterior to the left iliopsoas muscle which could represent a metastatic lymph node (N2 lesion).  12/17/2015:  Head MRI revealed no evidence of metastatic disease.   03/30/2016:  Chest, abdomen, and pelvic CT revealed interval resolution of left abdominal/pelvic lymphadenopathy.  There was no residual metastatic disease. 09/30/2016:  Chest, abdomen, and pelvic CT revealed no evidence of recurrent or metastatic disease.  09/10/2017:  Bilateral lower extremity duplex revealed evidence of obstruction in the left CFV, SFJ, and proximal femoral vein.  09/10/2017:  CT pelvic venogram revealed multiple retroperitoneal masses including large mass within the left iliacus concerning for metastatic disease in the setting of prior testicular cancer.  Mass in the left iliacus caused significant narrowing of the proximal left external iliac vein. There was filling defect within the distal left external iliac vein extending into the common femoral and proximal superficial femoral veins consistent with thrombus  09/11/2017:  Chest, abdomen, and pelvic CT revealed enlarged and morphologically abnormal 4.3 x 4.9 cm left pelvic sidewall and retroperitoneal lymph nodes (measuring up to 2.9 cm) concerning for metastatic disease given history of testicular cancer. Biopsy was suggested for confirmation. Prominent left inguinal lymph nodes could be metastatic or reactive in nature.  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  There was anasarca/swelling of the left lower extremity.  There was no evidence of metastatic disease in the chest. 09/13/2017:  Head MRI revealed no evidence of metastatic disease.   Infusion on 11/16/2017   Component Date Value Ref Range Status  . LDH 11/16/2017 158  98 - 192 U/L Final   Performed at [facility], [address] [place], [place] [address]  . Magnesium 11/16/2017 2.0  1.7 - 2.4 mg/dL Final   Performed at [facility], [address] [place], [place] [address]  . Sodium 11/16/2017 139  135 - 145 mmol/L Final  . Potassium 11/16/2017 4.2  3.5 - 5.1 mmol/L Final  . Chloride 11/16/2017 103  98 - 111 mmol/L Final  . CO2 11/16/2017 26  22 - 32 mmol/L Final  . Glucose, Bld 11/16/2017 119* 70 - 99 mg/dL Final  . BUN 11/16/2017 14  6 - 20 mg/dL Final  . Creatinine, Ser 11/16/2017 0.67  0.61 - 1.24 mg/dL Final  . Calcium 11/16/2017 9.2  8.9 - 10.3 mg/dL Final  . Total Protein 11/16/2017 7.1  6.5 - 8.1 g/dL Final  . Albumin 11/16/2017 3.8  3.5 - 5.0 g/dL Final  . AST 11/16/2017 18  15 - 41 U/L Final  . ALT 11/16/2017 14  0 - 44 U/L Final  . Alkaline Phosphatase 11/16/2017 52  38 - 126 U/L Final  . Total Bilirubin 11/16/2017 0.4  0.3 - 1.2 mg/dL Final  . GFR calc non Af Amer 11/16/2017 >60  >60 mL/min Final  . GFR calc Af Amer 11/16/2017 >60  >60 mL/min Final   Comment: (NOTE) The eGFR has been calculated using the CKD EPI equation. This calculation has not been validated in all clinical situations. eGFR's persistently <60 mL/min signify possible Chronic Kidney Disease.   . Anion gap 11/16/2017 10  5 - 15 Final   Performed at [facility], [address] [place],  [address]  . WBC 11/16/2017 5.2  3.8 - 10.6 K/uL Final  . RBC 11/16/2017 2.88* 4.40 - 5.90 MIL/uL Final  . Hemoglobin 11/16/2017 9.0* 13.0 - 18.0 g/dL Final  . HCT 11/16/2017 27.5* 40.0 - 52.0 % Final  . MCV 11/16/2017 95.6  80.0 - 100.0 fL Final  . MCH 11/16/2017 31.4    26.0 - 34.0 pg Final  . MCHC 11/16/2017 32.9  32.0 - 36.0 g/dL Final  . RDW 11/16/2017 18.9* 11.5 - 14.5 % Final  . Platelets 11/16/2017 490* 150 - 440 K/uL Final  . Neutrophils Relative % 11/16/2017 61  % Final  .  Neutro Abs 11/16/2017 3.1  1.4 - 6.5 K/uL Final  . Lymphocytes Relative 11/16/2017 18  % Final  . Lymphs Abs 11/16/2017 0.9* 1.0 - 3.6 K/uL Final  . Monocytes Relative 11/16/2017 20  % Final  . Monocytes Absolute 11/16/2017 1.0  0.2 - 1.0 K/uL Final  . Eosinophils Relative 11/16/2017 0  % Final  . Eosinophils Absolute 11/16/2017 0.0  0 - 0.7 K/uL Final  . Basophils Relative 11/16/2017 1  % Final  . Basophils Absolute 11/16/2017 0.1  0 - 0.1 K/uL Final   Performed at [facility], [address] [place], [place] [address]    Assessment:  Affan S Ende is a 27 y.o. male with recurrent testicular cancer.  He presented with a left lower extremity DVT on 09/10/2017.  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  Clinical/pathologic stage is T1 N1-2 S0 M0.  He received 3 cycles of  BEP (12/30/2015 - 02/24/2016).  He received OnPro Neulasta with cycle #2 and #3.  He declined evaluation for retroperitoneal lymph node dissection (RPLND).  He declined sperm banking.  Pre surgery labs on 11/20/2015 revealed an AFP of 411.9, beta-HCG 2,669, and LDH 522 (121-224).    Tumor markers have been followed:   AFP was 411.9 (0-8.3) on 11/20/2015, 11.2 on 12/19/2015, 6.3 on 12/23/2015, 1.3 on 01/27/2016, 1.8 on 02/17/2016, 1.9 on 03/30/2016, 1.4 on 05/27/2016, 1.0 on 07/29/2016, 1.4 on 10/14/2016, 3.3 on 12/24/2016, 1 on 09/10/2017, 1 on 09/20/2017, and 2.6 on 10/21/2017.    Beta-HCG was 2,669 (0-3) on 11/20/2015, 2 on 12/19/2015, 1 on 12/23/2015, < 1 on 01/27/2016, < 1 on 02/17/2016, < 1 on 03/30/2016, < 1 on 05/27/2016,  < 1 on 07/29/2016, < 1 on 10/14/2016, and 3 on 12/24/2016, < 5 on 09/11/2017,  < 5 on 09/20/2017, and < 1 on 10/21/2017.  LDH was 522 (98-192) on 11/20/2015, 179 on  12/19/2015, 160 on 12/23/2015, 202 on 01/27/2016, 156 on 02/10/2016, 161 on 02/17/2016, 172 on 03/30/2016, 178 on 05/27/2016, 150 on 07/29/2016, 152 on 10/14/2016, 145 on 12/24/2016, 1557 on 09/11/2017, 916 on 09/20/2017, and 144 on 10/21/2017.  Chest, abdomen, and pelvic CT on 09/11/2017 revealed enlarged and morphologically abnormal 4.3 x 4.9 cm left pelvic sidewall and retroperitoneal lymph nodes (measuring up to 2.9 cm).  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  There was anasarca/swelling of the left lower extremity.  There was no evidence of metastatic disease in the chest.  Head MRI on 09/13/2017 revealed no evidence of metastatic disease.   He has a left lower extremity DVT.  Bilateral lower extremity duplex on 09/10/2017 revealed evidence of obstruction in the left CFV, SFJ, and proximal femoral vein.  He is on Xarelto.  He is currently day 23 s/p cycle #2 TIP chemotherapy (09/21/2017 - 10/21/2017) with Udencya support. Cycle #1 was administered at [facility]. Cycle #2 was administered at [facility].  Both cycles were complicated by lower extremity skin lesions on recovery of counts.    Audiogram on 10/27/2017 revealed hearing within normal limits.  He was diagnosed with cellulitis of the left lower extremity on 10/06/2017.  He was on clindamycin.  While hospitalized (10/08/2017 - 10/16/2017), he received 9 days of vancomycin and Cefepime.  He was discharged on Flagyl, doxycycline, and Levaquin.  He was admitted to [facility] from 10/08/2017 - 10/16/2017 with left lower extremity cellulitis.  He was treated with Cefepime and vancomycin.  Imaging revealed significant improvement in adenopathy with decreased pelvic sidewall disease and decreased bulk at aortic bifurcation.  He was felt to have venous congestion syndrome with skin lesions due to congestion.   He was admitted to [facility] oncology floor from 10/21/2017 - 10/26/2017 for inpatient  chemotherapy (cycle #2 TIP).  Was seen in consult by Dr. Tsosie Billing (infectious disease) who felt the patient had been treated adequately with antibiotic coverage and not require follow-up further treatment for his left lower extremity cellulitis.  CBC on the day of discharge revealed a WBC 4700 ([place] 4600).  Hemoglobin 10.0, hematocrit 28.0, MCV 91.4, and platelets 371,000.    He was admitted to [facility] from 11/05/2017 - 11/09/2017 for cellulitis. He was treated with Cefepime and vancomycin.  He began oral acyclovir for herpetic labialis.  He underwent skin biopsy.  Cultures were negative.  He was admitted to [facility] from 11/05/2017 - 11/09/2017 for cellulitis.  He was developing similar lower extremity skin lesions following his first cycle of chemotherapy.  Xarelto was held as he was thrombocytopenic (nadir platelet count 39,000).  He was treated with heparin.  Lower extremity duplex revealed no clot.  He was treated with Cefepime and vancomycin.  He was discharged on Augmentin.  He began oral acyclovir for herpetic labialis.  He underwent IVC filterplacement, percutaneous angioplastyand stent placementof the left common iliac vein and left external iliac vein on 10/13/2017.  He was discharged on Flagyl, doxycycline, and Levaquin.  Hypercoagulable work-up on 10/12/2017 negative for the following: Factor V Leiden, prothrombin gene mutation, lupus anticoagulant, anticardiolipin antibodies, beta-2 glycoprotein, protein C activity/antigen, protein S activity/antigen. ATIII was 54% (75-120) on heparin.  Symptomatically, he has new dental pain.  He has right foot pain (s/p nail puncture) affecting his gait.  Right foot is now edematous.  Counts continue to improve.  Plan: 1. Labs today:  CBC with diff, CMP, Mg, LDH, AFP, beta-HCG.  2. Recurrent testicular cancer - treatment ongoing Counts have recovered. Discuss plan for cycle #3 TIP chemotherapy once multiple issues resolved (see  below). Phone call today to [facility] regarding potential chemotherapy induce skin toxicity (doubt)- done.  3. Cellulitis and skin lesions -  Cellulitis has resolved. Complete Augmentin complete. Continue wound care to circular lesions (previously hemorrhagic bullae). Phone follow-up today with Dr. Reuel Derby- path sent to derm path in [place].  Await callback today. Phone follow-up with Dr. Nehemiah Massed- differential includes bullous impetigo or chemotherapy reaction.  Await biopsy. If biopsy is c/w bullous impetigo, will discuss with ID potential prophylactic antibiotic during patient's nadir. 4. Chemotherapy induced anemia - ongoing Labs reviewed.   Counts continue to improve daily. 5. Electrolyte derangements - ongoing Electrolytes stable. Continue magnesium oxide and potassium chloride. 6. Herpetic lip lesion - improving Rx: acyclovir 400 mg po q 8 hours.. 7. Left lower extremity DVT- chronic  Patient notes cost issues for obtaining Xarelto.  Discuss with Elease Etienne, social work, and [employer], pharmacist, for assistance.  Patient has coverage. 8. Right foot pain-  Right foot where patient stepped on a nail has become more painful  and swollen.  Decreased toe mobility secondary to pain.  Pain is affecting is gait.  MRI foot today. 9. Dental issues-  Patient notes a 2 day history of right lower dental pain.  Several teeth are in poor condition.  Referral to dentistry (Dr. Eugenie Birks).  I spoke with him and he is agreeable to seeing patient today.  Teeth are a potential source of infection if patient has a dental abscess and receives chemotherapy; he has a nadir ANC of 0 despite Neulasta.  10. RTC tomorrow for reassessment and review of interval testing.  A total of (> 25) minutes of face-to-face time was spent with the patient with greater than 50% of that time in counseling and care-coordination.    [name], MD  11/16/2017, 10:42 AM

## 2017-11-16 NOTE — Progress Notes (Signed)
Here for follow up. 1/0- if pt need K dor- needs pills not powder  2)running  out of xarelto- needs generic or equal meds as ins will not pay for meds  3) noted by mother this am- raised diffuse mildly pink/ red raised rash on arms. Not itchy per pt.

## 2017-11-16 NOTE — Telephone Encounter (Signed)
Oral Chemotherapy Pharmacist Encounter   Contacted by Dr. Mike Gip to look into Xarelto access from Edgar [name] because he will need long-term anticoagulation. Patient advocate Edgar [name] conducted a benefits investigation and found that the patient had commercial insurance and Medicaid. Per his girlfriend Edgar [name], his Medicaid will end when he goes back to work.   When he no longer has medicaid he will be able to use a copay card along with his commercial insurance to bring his copay down to ~$10/month. Xarelto copay card information listed below.  [id number] GroupEtheleen Sia ID: [id number]  A copy of this card will be mailed to the patient and scanned into Epic.   Darl Pikes, PharmD, BCPS, [facility] Hematology/Oncology Clinical Pharmacist [facility] [phone]  11/16/2017 3:54 PM

## 2017-11-16 NOTE — Telephone Encounter (Signed)
Per Elease Etienne, called [facility] to order the following medications:  Acyclovir 400 mg 3 X daily for 10 days and Xarelto 20 mg 1 daily #30 with 2 refills.

## 2017-11-16 NOTE — Telephone Encounter (Signed)
Patient called asking to speak with Rodena Piety, I explained that Rodena Piety is not available and asked how I could assist him. He states he needs to know when and how long to come off Eliquis before dental procedure and that hew has a form that needs to be completed for Dr Koren Bound. He will email the form to me.  He is also requesting more pain medicine. He state she has about 6 Oxycodone and 10 Oxycontin tabs, but doesn't want to use it all on his tooth pain.   His mother called also and is requesting that Dr Mike Gip return her call to discuss appointment today. [phone]

## 2017-11-17 ENCOUNTER — Encounter: Payer: Self-pay | Admitting: *Deleted

## 2017-11-17 ENCOUNTER — Ambulatory Visit: Payer: BLUE CROSS/BLUE SHIELD | Admitting: Hematology and Oncology

## 2017-11-17 ENCOUNTER — Telehealth: Payer: Self-pay | Admitting: *Deleted

## 2017-11-17 ENCOUNTER — Ambulatory Visit
Admission: RE | Admit: 2017-11-17 | Discharge: 2017-11-17 | Disposition: A | Discharge status: Home / Self Care | Payer: BLUE CROSS/BLUE SHIELD | Source: Ambulatory Visit | Attending: Hematology and Oncology | Admitting: Hematology and Oncology

## 2017-11-17 DIAGNOSIS — I824Y2 Acute embolism and thrombosis of unspecified deep veins of left proximal lower extremity: Secondary | ICD-10-CM | POA: Diagnosis not present

## 2017-11-17 DIAGNOSIS — I82412 Acute embolism and thrombosis of left femoral vein: Secondary | ICD-10-CM | POA: Diagnosis not present

## 2017-11-17 DIAGNOSIS — S99921S Unspecified injury of right foot, sequela: Secondary | ICD-10-CM | POA: Diagnosis not present

## 2017-11-17 DIAGNOSIS — Z9181 History of falling: Secondary | ICD-10-CM | POA: Diagnosis not present

## 2017-11-17 DIAGNOSIS — S90851A Superficial foreign body, right foot, initial encounter: Secondary | ICD-10-CM | POA: Diagnosis not present

## 2017-11-17 DIAGNOSIS — J45909 Unspecified asthma, uncomplicated: Secondary | ICD-10-CM | POA: Diagnosis not present

## 2017-11-17 DIAGNOSIS — D649 Anemia, unspecified: Secondary | ICD-10-CM | POA: Diagnosis not present

## 2017-11-17 DIAGNOSIS — K219 Gastro-esophageal reflux disease without esophagitis: Secondary | ICD-10-CM | POA: Diagnosis not present

## 2017-11-17 DIAGNOSIS — Z79891 Long term (current) use of opiate analgesic: Secondary | ICD-10-CM | POA: Diagnosis not present

## 2017-11-17 DIAGNOSIS — F321 Major depressive disorder, single episode, moderate: Secondary | ICD-10-CM | POA: Diagnosis not present

## 2017-11-17 DIAGNOSIS — F17210 Nicotine dependence, cigarettes, uncomplicated: Secondary | ICD-10-CM | POA: Diagnosis not present

## 2017-11-17 DIAGNOSIS — Z452 Encounter for adjustment and management of vascular access device: Secondary | ICD-10-CM | POA: Diagnosis not present

## 2017-11-17 DIAGNOSIS — Z792 Long term (current) use of antibiotics: Secondary | ICD-10-CM | POA: Diagnosis not present

## 2017-11-17 DIAGNOSIS — C6290 Malignant neoplasm of unspecified testis, unspecified whether descended or undescended: Secondary | ICD-10-CM | POA: Diagnosis not present

## 2017-11-17 DIAGNOSIS — L03116 Cellulitis of left lower limb: Secondary | ICD-10-CM | POA: Diagnosis not present

## 2017-11-17 DIAGNOSIS — Z7901 Long term (current) use of anticoagulants: Secondary | ICD-10-CM | POA: Diagnosis not present

## 2017-11-17 DIAGNOSIS — L988 Other specified disorders of the skin and subcutaneous tissue: Secondary | ICD-10-CM | POA: Diagnosis not present

## 2017-11-17 DIAGNOSIS — F329 Major depressive disorder, single episode, unspecified: Secondary | ICD-10-CM | POA: Diagnosis not present

## 2017-11-17 DIAGNOSIS — D6481 Anemia due to antineoplastic chemotherapy: Secondary | ICD-10-CM | POA: Diagnosis not present

## 2017-11-17 DIAGNOSIS — L982 Febrile neutrophilic dermatosis [Sweet]: Secondary | ICD-10-CM | POA: Diagnosis not present

## 2017-11-17 LAB — AFP TUMOR MARKER: AFP, Serum, Tumor Marker: 2.1 ng/mL (ref 0.0–8.3)

## 2017-11-17 LAB — SURGICAL PATHOLOGY

## 2017-11-17 MED ORDER — GADOBENATE DIMEGLUMINE 529 MG/ML IV SOLN
20.0000 mL | Freq: Once | INTRAVENOUS | Status: AC | PRN
  Administered 2017-11-17: 20 mL via INTRAVENOUS

## 2017-11-17 NOTE — Telephone Encounter (Signed)
Called patient's mother, Manuela Schwartz and spoke to her regarding the following:    1.  Patient to see Dr Mike Gip Thursday, September 5 @ 9:15 for MRI results.  2.  MD will talk to vascular today to determine if patient can come off of Xarelto for tooth extraction.  3.  Requested patient bring all medications he is taking to clinic tomorrow to be reconciled.  4.  MD will address pain medications and K+ medication at appointment also.

## 2017-11-17 NOTE — Telephone Encounter (Signed)
I have called patient's mother and had long discussion this morning concerning patient.  He will rtc tomorrow to see MD. He will have MRI today @ 8 PM.  We will address his pain medication refills at his appointment tomorrow.  Dr. Mike Gip is talking to the dentist he was referred to yesterday and will have also speak with vascular to determine how long patient can be off Xarelto in order to address his teeth.  All of this will be discussed with patient either by phone or at his appointment tomorrow

## 2017-11-17 NOTE — Telephone Encounter (Signed)
Called patient's mother, Edgar [name] and lvm to hold Xarelto today and tomorrow.  Dr. Mike Gip has spoken again with Dr. Eugenie Birks and he is looking for a dentist that will pull his tooth.  She has also spoken with vascular who states patient can be off of Xarelto for 2 days. Asked Edgar [name] to call me back to confirm receipt of this message.   Edgar [name] called back to state that she received my message and understands patient is to hold Xarelto today and tomorrow.

## 2017-11-18 ENCOUNTER — Encounter: Payer: Self-pay | Admitting: Hematology and Oncology

## 2017-11-18 ENCOUNTER — Telehealth: Payer: Self-pay | Admitting: *Deleted

## 2017-11-18 ENCOUNTER — Inpatient Hospital Stay: Payer: BLUE CROSS/BLUE SHIELD

## 2017-11-18 ENCOUNTER — Inpatient Hospital Stay (HOSPITAL_BASED_OUTPATIENT_CLINIC_OR_DEPARTMENT_OTHER): Payer: BLUE CROSS/BLUE SHIELD | Admitting: Hematology and Oncology

## 2017-11-18 ENCOUNTER — Other Ambulatory Visit: Payer: Self-pay | Admitting: Hematology and Oncology

## 2017-11-18 ENCOUNTER — Other Ambulatory Visit: Payer: Self-pay

## 2017-11-18 VITALS — BP 128/87 | HR 111 | Temp 98.6°F | Resp 18 | Wt 265.0 lb

## 2017-11-18 DIAGNOSIS — C6210 Malignant neoplasm of unspecified descended testis: Secondary | ICD-10-CM

## 2017-11-18 DIAGNOSIS — D61810 Antineoplastic chemotherapy induced pancytopenia: Secondary | ICD-10-CM

## 2017-11-18 DIAGNOSIS — I82592 Chronic embolism and thrombosis of other specified deep vein of left lower extremity: Secondary | ICD-10-CM | POA: Diagnosis not present

## 2017-11-18 DIAGNOSIS — Z803 Family history of malignant neoplasm of breast: Secondary | ICD-10-CM

## 2017-11-18 DIAGNOSIS — T451X5A Adverse effect of antineoplastic and immunosuppressive drugs, initial encounter: Secondary | ICD-10-CM

## 2017-11-18 DIAGNOSIS — F17210 Nicotine dependence, cigarettes, uncomplicated: Secondary | ICD-10-CM | POA: Diagnosis not present

## 2017-11-18 DIAGNOSIS — L982 Febrile neutrophilic dermatosis [Sweet]: Secondary | ICD-10-CM

## 2017-11-18 DIAGNOSIS — S91341D Puncture wound with foreign body, right foot, subsequent encounter: Secondary | ICD-10-CM

## 2017-11-18 DIAGNOSIS — S91331A Puncture wound without foreign body, right foot, initial encounter: Secondary | ICD-10-CM | POA: Diagnosis not present

## 2017-11-18 DIAGNOSIS — Z808 Family history of malignant neoplasm of other organs or systems: Secondary | ICD-10-CM

## 2017-11-18 DIAGNOSIS — C792 Secondary malignant neoplasm of skin: Secondary | ICD-10-CM

## 2017-11-18 DIAGNOSIS — K219 Gastro-esophageal reflux disease without esophagitis: Secondary | ICD-10-CM

## 2017-11-18 DIAGNOSIS — Z79899 Other long term (current) drug therapy: Secondary | ICD-10-CM

## 2017-11-18 DIAGNOSIS — I82412 Acute embolism and thrombosis of left femoral vein: Secondary | ICD-10-CM

## 2017-11-18 DIAGNOSIS — Z7901 Long term (current) use of anticoagulants: Secondary | ICD-10-CM

## 2017-11-18 DIAGNOSIS — C6212 Malignant neoplasm of descended left testis: Secondary | ICD-10-CM

## 2017-11-18 DIAGNOSIS — Z7689 Persons encountering health services in other specified circumstances: Secondary | ICD-10-CM | POA: Diagnosis not present

## 2017-11-18 DIAGNOSIS — R509 Fever, unspecified: Secondary | ICD-10-CM | POA: Diagnosis not present

## 2017-11-18 DIAGNOSIS — L309 Dermatitis, unspecified: Secondary | ICD-10-CM

## 2017-11-18 DIAGNOSIS — K047 Periapical abscess without sinus: Secondary | ICD-10-CM

## 2017-11-18 DIAGNOSIS — G620 Drug-induced polyneuropathy: Secondary | ICD-10-CM | POA: Diagnosis not present

## 2017-11-18 DIAGNOSIS — R000 Tachycardia, unspecified: Secondary | ICD-10-CM | POA: Diagnosis not present

## 2017-11-18 DIAGNOSIS — B001 Herpesviral vesicular dermatitis: Secondary | ICD-10-CM | POA: Diagnosis not present

## 2017-11-18 DIAGNOSIS — E876 Hypokalemia: Secondary | ICD-10-CM

## 2017-11-18 DIAGNOSIS — K0889 Other specified disorders of teeth and supporting structures: Secondary | ICD-10-CM

## 2017-11-18 DIAGNOSIS — Z800 Family history of malignant neoplasm of digestive organs: Secondary | ICD-10-CM

## 2017-11-18 DIAGNOSIS — M79671 Pain in right foot: Secondary | ICD-10-CM | POA: Diagnosis not present

## 2017-11-18 LAB — BETA HCG QUANT (REF LAB): hCG Quant: <1 m[IU]/mL (ref 0–3)

## 2017-11-18 MED ORDER — POTASSIUM CHLORIDE CRYS ER 20 MEQ PO TBCR: 20.0000 meq | EXTENDED_RELEASE_TABLET | Freq: Two times a day (BID) | ORAL | 1 refills | Status: DC

## 2017-11-18 MED ORDER — OXYCODONE HCL 10 MG PO TABS: 10.0000 mg | ORAL_TABLET | Freq: Three times a day (TID) | ORAL | 0 refills | Status: DC | PRN

## 2017-11-18 MED ORDER — AMOXICILLIN 500 MG PO TABS: 500.0000 mg | ORAL_TABLET | Freq: Four times a day (QID) | ORAL | 0 refills | Status: DC

## 2017-11-18 NOTE — Telephone Encounter (Signed)
Manuela Schwartz called and states 3 prescription was to be sent to pharmacy for patient and nothing was sent. Please send and she wants to be notified when prescription sent in

## 2017-11-18 NOTE — Telephone Encounter (Signed)
Erroneous entry

## 2017-11-18 NOTE — Telephone Encounter (Signed)
Alan Ripper informed that prescription was sent in

## 2017-11-18 NOTE — Progress Notes (Signed)
[facility] day:  11/18/2017  Chief Complaint: Edgar [name] is a 27 y.o. male with recurrent testicular cancer who is seen for review of interval studies and discussion regarding direction of therapy.  HPI: The patient was last seen in the medical oncology clinic on 11/16/2017.  At that time, he had new right sided dental pain.  He had right foot pain (s/p nail puncture) affecting his gait.  Right foot was edematous.  Counts continued to improve.  Chemotherapy was postponed.  Skin biopsy on 11/12/2017 revealed diffuse neutrophilic dermatitis.  Special stains for fungi and bacteria were negative.  Cultures were negative.  There was no evidence of vasculitis.  Differential included a variant of pyoderma gangrenosum.   He was seen by Dr. Eugenie Birks, dentist, on 11/16/2017.  He had very poor dentition and a dental abscess.  He is scheduled for extraction on 11/19/2017.  Xarelto has been held for 2 days.  Right foot MRI on 11/17/2017 revealed artifact in the plantar soft tissues over the first metatarsal head likely representing metallic foreign bodies as seen on previous radiographs.  There was an area of increased T2 signal intensity in the plantar aspect of the first metatarsal head with focal enhancement may indicate an area of focal osteomyelitis.  There was no soft tissue abscess.  During the interim, he notes that his tooth has been extremely painful.  He is taking oxycodone for pain relief (ran out).  He completed his course of Augmentin.  Left upper calf lesion is slowly improving.  He is on acyclovir.  Upper lip herpetic lesions have improved.   Past Medical History:  Diagnosis Date  . Asthma    AS A CHILD-NO INHALERS  . Cancer ([place])    testicular cancer 11/2016 per pt   . DVT (deep venous thrombosis) ([place])   . GERD (gastroesophageal reflux disease)    TUMS PRN    Past Surgical History:  Procedure Laterality Date  . ANKLE FRACTURE  SURGERY Right 2004  . FINE NEEDLE ASPIRATION BIOPSY N/A 11/09/2017   Procedure: FINE NEEDLE ASPIRATION BIOPSY;  Surgeon: [name], MD;  Location: [facility];  Service: Cardiovascular;  Laterality: N/A;  . ORCHIECTOMY Left 11/26/2015   Procedure: ORCHIECTOMY;  Surgeon: [name], MD;  Location: [facility];  Service: Urology;  Laterality: Left;  radical/Inguinal approach  . PERIPHERAL VASCULAR CATHETERIZATION N/A 12/16/2015   Procedure: [name] Cath Insertion;  Surgeon: [name], MD;  Location: [facility];  Service: Cardiovascular;  Laterality: N/A;  . PERIPHERAL VASCULAR THROMBECTOMY Left 10/13/2017   Procedure: PERIPHERAL VASCULAR THROMBECTOMY;  Surgeon: [name], MD;  Location: [facility];  Service: Cardiovascular;  Laterality: Left;  . WISDOM TOOTH EXTRACTION  2017    Family History  Problem Relation Age of Onset  . Skin cancer Mother   . Breast cancer Maternal Grandmother   . Colon cancer Maternal Grandfather   . Diabetes Maternal Grandfather   . Emphysema Father   . Mental illness Sister        anxiety  . Stroke Paternal Grandmother   . Prostate cancer Neg Hx   . Kidney cancer Neg Hx   . Bladder Cancer Neg Hx     Social History:  reports that he has been smoking cigarettes. He has a 4.00 pack-year smoking history. He has quit using smokeless tobacco.  His smokeless tobacco use included snuff. He reports that he has current or past  drug history. Drug: Marijuana. He reports that he does not drink alcohol.  He smokes 1/2 pack a day.  He smokes marijuana.  He works in a [employer] in [place] (last worked 08/2017).  He has a 67 year-old-daughter named [name]. The patient's mother's cell phone number is [phone].  The patient lives in [place]. He is accompanied by his mother today.   Allergies: No Known Allergies  Current Medications: Current Outpatient Medications  Medication Sig Dispense Refill  . acetaminophen (TYLENOL) 500 MG  tablet Take 500-1,000 mg by mouth every 6 (six) hours as needed for mild pain or fever.     Marland Kitchen acyclovir (ZOVIRAX) 400 MG tablet Take 400 mg by mouth 3 (three) times daily.    Marland Kitchen dexamethasone (DECADRON) 4 MG tablet Take 8 mg by mouth daily. Take 8 mg by mouth once daily for three days after chemo to prevent nausea    . DULoxetine (CYMBALTA) 60 MG capsule Take 1 tablet by mouth daily.    Marland Kitchen gabapentin (NEURONTIN) 300 MG capsule Take 1 capsule (300 mg total) by mouth at bedtime. 30 capsule 0  . levofloxacin (LEVAQUIN) 500 MG tablet Take 500 mg by mouth daily.    Marland Kitchen loratadine (CLARITIN) 10 MG tablet Take 1 tablet (10 mg total) by mouth daily. 90 tablet 1  . magnesium oxide (MAG-OX) 400 MG tablet Take 1 tablet by mouth daily.    . ondansetron (ZOFRAN-ODT) 4 MG disintegrating tablet Take 2 tablets by mouth every 8 (eight) hours as needed for nausea.     Marland Kitchen oxyCODONE (OXYCONTIN) 10 mg 12 hr tablet Take 1 tablet (10 mg total) by mouth every 12 (twelve) hours. 10 tablet 0  . Oxycodone HCl 10 MG TABS Take 1 tablet (10 mg total) by mouth every 8 (eight) hours as needed. 20 tablet 0  . potassium chloride SA (K-DUR,KLOR-CON) 20 MEQ tablet Take 20 mEq by mouth 2 (two) times daily.    . prochlorperazine (COMPAZINE) 10 MG tablet Take 1 tablet by mouth every 6 (six) hours as needed (nausea/vomiting).     Alveda Reasons 20 MG TABS tablet Take 1 tablet by mouth daily.    . mupirocin cream (BACTROBAN) 2 % Apply topically 3 (three) times daily. (Patient not taking: Reported on 11/18/2017) 30 g 1  . nicotine (NICODERM CQ - DOSED IN MG/24 HOURS) 21 mg/24hr patch Place 21 mg onto the skin daily.      No current facility-administered medications for this visit.    Facility-Administered Medications Ordered in Other Visits  Medication Dose Route Frequency Provider Last Rate Last Dose  . heparin lock flush 100 unit/mL  500 Units Intravenous Once Corcoran, [name], MD      . sodium chloride flush (NS) 0.9 % injection 10 mL  10 mL  Intravenous PRN Nolon Stalls C, MD   10 mL at 10/21/17 1008    Review of Systems  Constitutional: Negative for chills, diaphoresis, fever, malaise/fatigue and weight loss (stable).       Dental pain affecting well being.  HENT: Negative for congestion, ear discharge, ear pain, hearing loss, nosebleeds, sinus pain, sore throat and tinnitus.        Dysgeusia.  Painful right lower molar abscess.  Eyes: Negative.  Negative for blurred vision, double vision, photophobia, pain and discharge.  Respiratory: Positive for shortness of breath (minimal). Negative for cough, hemoptysis, sputum production and wheezing.   Cardiovascular: Positive for leg swelling (left thigh and right foot). Negative for chest pain, palpitations,  orthopnea and PND.  Gastrointestinal: Negative.  Negative for abdominal pain, blood in stool, constipation, diarrhea, heartburn, melena, nausea and vomiting.  Genitourinary: Negative.  Negative for dysuria, frequency, hematuria and urgency.  Musculoskeletal: Negative for back pain, falls, joint pain, myalgias and neck pain.       Right foot pain affecting gait, although not as painful as tooth.  Skin: Positive for rash (left lower extremity, improving). Negative for itching.  Neurological: Positive for focal weakness (pain limiting factor to toe mobility). Negative for dizziness, tingling, tremors, sensory change, speech change, weakness and headaches.  Endo/Heme/Allergies: Negative for environmental allergies. Does not bruise/bleed easily.  Psychiatric/Behavioral: Negative for depression. The patient is not nervous/anxious and does not have insomnia.        Discouraged.  All other systems reviewed and are negative.  Performance status (ECOG):2  Vital Signs BP 128/87 (BP Location: Left Arm, Patient Position: Sitting)   Pulse (!) 111   Temp 98.6 F (37 C) (Tympanic)   Resp 18   Wt 265 lb (120.2 kg)   BMI 33.12 kg/m   Physical Exam  Constitutional: He is oriented to  person, place, and time and well-developed, well-nourished, and in no distress. He appears unhealthy. He has a sickly appearance. No distress.  HENT:  Head: Normocephalic and atraumatic.  Nose: Nose normal.  Alopecia.  Poor dentition.  Eyes: Pupils are equal, round, and reactive to light. Conjunctivae and EOM are normal. No scleral icterus.  Brown eyes.  Cardiovascular: Normal pulses.  Musculoskeletal: Normal range of motion. He exhibits edema (left thigh edema, improved) and tenderness (one right thigh "knot" tender with no overlying skin changes.  Second thigh lesion, resolved.).  Edema in right midfoot distally, improved.  Puncture site remains tender, but non-erythematous  Neurological: He is alert and oriented to person, place, and time.  Can wiggle right toes  Skin: Skin is warm and dry. Lesion (6 circular lesions to LEFT inner and posterior thigh-healing) and rash (Herpetic upper lip lesion, resolving) noted. No bruising noted. He is not diaphoretic. No erythema. No pallor.  #1: 5 circular lesions to LEFT inner thigh, and 1 circular lesions to LEFT posterior thigh - hyperpigmented and healing.  #2: 1 circular lesion healing at top of LLE dressing with irregular central slightly yellow exudate. #3  Bottom of right foot with tiny mark s/p nail puncture; painful to palpation/pressure.  Psychiatric: Mood, affect and judgment normal.  Nursing note and vitals reviewed.  Imaging studies: 11/28/2015:  Chest, abdomen, and pelvic CT revealed no mediastinal adenopathy or suspicious pulmonary nodules.   There were 2 prominent left periaortic retroperitoneal lymph nodes (1.7 cm and 0.9 cm) concerning for testicular nodal metastasis.  There were no abnormal lymph nodes above the renal veins.  There were postsurgical change in the left hemiscrotum and left inguinal canal.  There was a 3.3 cm soft tissue abnormality anterior to the left iliopsoas muscle which could represent a metastatic lymph node (N2  lesion).  12/17/2015:  Head MRI revealed no evidence of metastatic disease.   03/30/2016:  Chest, abdomen, and pelvic CT revealed interval resolution of left abdominal/pelvic lymphadenopathy.  There was no residual metastatic disease. 09/30/2016:  Chest, abdomen, and pelvic CT revealed no evidence of recurrent or metastatic disease.  09/10/2017:  Bilateral lower extremity duplex revealed evidence of obstruction in the left CFV, SFJ, and proximal femoral vein.  09/10/2017:  CT pelvic venogram revealed multiple retroperitoneal masses including large mass within the left iliacus concerning for metastatic disease in the  setting of prior testicular cancer.  Mass in the left iliacus caused significant narrowing of the proximal left external iliac vein. There was filling defect within the distal left external iliac vein extending into the common femoral and proximal superficial femoral veins consistent with thrombus  09/11/2017:  Chest, abdomen, and pelvic CT revealed enlarged and morphologically abnormal 4.3 x 4.9 cm left pelvic sidewall and retroperitoneal lymph nodes (measuring up to 2.9 cm) concerning for metastatic disease given history of testicular cancer. Biopsy was suggested for confirmation. Prominent left inguinal lymph nodes could be metastatic or reactive in nature.  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  There was anasarca/swelling of the left lower extremity.  There was no evidence of metastatic disease in the chest. 09/13/2017:  Head MRI revealed no evidence of metastatic disease. 11/17/2017:  Right foot MRI revealed artifact in the plantar soft tissues over the first metatarsal head likely representing metallic foreign bodies as seen on previous radiographs.  There was an area of increased T2 signal intensity in the plantar aspect of the first metatarsal head with focal enhancement may indicate an area of focal osteomyelitis.   There was no soft tissue abscess.   Infusion on 11/16/2017  Component Date Value Ref Range Status  . hCG Quant 11/16/2017 <1  0 - 3 mIU/mL Final   Comment: (NOTE) Roche ECLIA methodology Performed At: [facility] [place], [place] [id number] Rush Farmer MD [id number]   . AFP, Serum, Tumor Marker 11/16/2017 2.1  0.0 - 8.3 ng/mL Final   Comment: (NOTE) Roche Diagnostics Electrochemiluminescence Immunoassay (ECLIA) Values obtained with different assay methods or kits cannot be used interchangeably.  Results cannot be interpreted as absolute evidence of the presence or absence of malignant disease. This test is not interpretable in pregnant females. Performed At: [facility] [place], [place] [id number] Rush Farmer MD [id number]   . LDH 11/16/2017 158  98 - 192 U/L Final   Performed at [facility], [address] [place], [place] [address]  . Magnesium 11/16/2017 2.0  1.7 - 2.4 mg/dL Final   Performed at [facility], [address] [place], [place] [address]  . Sodium 11/16/2017 139  135 - 145 mmol/L Final  . Potassium 11/16/2017 4.2  3.5 - 5.1 mmol/L Final  . Chloride 11/16/2017 103  98 - 111 mmol/L Final  . CO2 11/16/2017 26  22 - 32 mmol/L Final  . Glucose, Bld 11/16/2017 119* 70 - 99 mg/dL Final  . BUN 11/16/2017 14  6 - 20 mg/dL Final  . Creatinine, Ser 11/16/2017 0.67  0.61 - 1.24 mg/dL Final  . Calcium 11/16/2017 9.2  8.9 - 10.3 mg/dL Final  . Total Protein 11/16/2017 7.1  6.5 - 8.1 g/dL Final  . Albumin 11/16/2017 3.8  3.5 - 5.0 g/dL Final  . AST 11/16/2017 18  15 - 41 U/L Final  . ALT 11/16/2017 14  0 - 44 U/L Final  . Alkaline Phosphatase 11/16/2017 52  38 - 126 U/L Final  . Total Bilirubin 11/16/2017 0.4  0.3 - 1.2 mg/dL Final  . GFR calc non Af Amer 11/16/2017 >60  >60 mL/min Final  . GFR calc Af Amer 11/16/2017 >60  >60 mL/min Final   Comment: (NOTE) The eGFR has been calculated using the  CKD EPI equation. This calculation has not been validated in all clinical situations. eGFR's persistently <60 mL/min signify possible Chronic Kidney Disease.   Marland Kitchen  Anion gap 11/16/2017 10  5 - 15 Final   Performed at [facility], [address] [place], [place] [address]  . WBC 11/16/2017 5.2  3.8 - 10.6 K/uL Final  . RBC 11/16/2017 2.88* 4.40 - 5.90 MIL/uL Final  . Hemoglobin 11/16/2017 9.0* 13.0 - 18.0 g/dL Final  . HCT 11/16/2017 27.5* 40.0 - 52.0 % Final  . MCV 11/16/2017 95.6  80.0 - 100.0 fL Final  . MCH 11/16/2017 31.4  26.0 - 34.0 pg Final  . MCHC 11/16/2017 32.9  32.0 - 36.0 g/dL Final  . RDW 11/16/2017 18.9* 11.5 - 14.5 % Final  . Platelets 11/16/2017 490* 150 - 440 K/uL Final  . Neutrophils Relative % 11/16/2017 61  % Final  . Neutro Abs 11/16/2017 3.1  1.4 - 6.5 K/uL Final  . Lymphocytes Relative 11/16/2017 18  % Final  . Lymphs Abs 11/16/2017 0.9* 1.0 - 3.6 K/uL Final  . Monocytes Relative 11/16/2017 20  % Final  . Monocytes Absolute 11/16/2017 1.0  0.2 - 1.0 K/uL Final  . Eosinophils Relative 11/16/2017 0  % Final  . Eosinophils Absolute 11/16/2017 0.0  0 - 0.7 K/uL Final  . Basophils Relative 11/16/2017 1  % Final  . Basophils Absolute 11/16/2017 0.1  0 - 0.1 K/uL Final   Performed at [facility], [address] [place], [place] [address]    Assessment:  KORION CUEVAS is a 27 y.o. male with recurrent testicular cancer.  He presented with a left lower extremity DVT on 09/10/2017.  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  Clinical/pathologic stage is T1 N1-2 S0 M0.  He received 3 cycles of  BEP (12/30/2015 - 02/24/2016).  He received OnPro Neulasta with cycle #2 and #3.  He declined evaluation for retroperitoneal lymph node  dissection (RPLND).  He declined sperm banking.  Pre surgery labs on 11/20/2015 revealed an AFP of 411.9, beta-HCG 2,669, and LDH 522 (121-224).    Tumor markers have been followed:   AFP was 411.9 (0-8.3) on 11/20/2015, 11.2 on 12/19/2015, 6.3 on 12/23/2015, 1.3 on 01/27/2016, 1.8 on 02/17/2016, 1.9 on 03/30/2016, 1.4 on 05/27/2016, 1.0 on 07/29/2016, 1.4 on 10/14/2016, 3.3 on 12/24/2016, 1 on 09/10/2017, 1 on 09/20/2017,  2.6 on 10/21/2017, and 2.1 on 11/16/2017.    Beta-HCG was 2,669 (0-3) on 11/20/2015, 2 on 12/19/2015, 1 on 12/23/2015, < 1 on 01/27/2016, < 1 on 02/17/2016, < 1 on 03/30/2016, < 1 on 05/27/2016,  < 1 on 07/29/2016, < 1 on 10/14/2016, and 3 on 12/24/2016, < 5 on 09/11/2017,  < 5 on 09/20/2017, and < 1 on 10/21/2017.  LDH was 522 (98-192) on 11/20/2015, 179 on 12/19/2015, 160 on 12/23/2015, 202 on 01/27/2016, 156 on 02/10/2016, 161 on 02/17/2016, 172 on 03/30/2016, 178 on 05/27/2016, 150 on 07/29/2016, 152 on 10/14/2016, 145 on 12/24/2016, 1557 on 09/11/2017, 916 on 09/20/2017, 144 on 10/21/2017, and 158 on 11/16/2017.  Chest, abdomen, and pelvic CT on 09/11/2017 revealed enlarged and morphologically abnormal 4.3 x 4.9 cm left pelvic sidewall and retroperitoneal lymph nodes (measuring up to 2.9 cm).  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  There was anasarca/swelling of the left lower extremity.  There was no evidence of metastatic disease in the chest.  Head MRI on 09/13/2017 revealed no evidence of metastatic disease.   He has a left lower extremity DVT.  Bilateral lower extremity duplex on 09/10/2017 revealed evidence of obstruction in the left CFV, SFJ, and proximal femoral vein.  He is on Xarelto.  He is currently day 23 s/p cycle #2 TIP chemotherapy (09/21/2017 - 10/21/2017) with Margarette Canada support. Cycle #1 was administered at [facility]. Cycle #2 was administered at [facility].  Both cycles were complicated by lower  extremity skin lesions on recovery of counts.  Audiogram on 10/27/2017 revealed hearing within normal limits.  He was diagnosed with cellulitis of the left lower extremity on 10/06/2017.  He was on clindamycin.  While hospitalized (10/08/2017 - 10/16/2017), he received 9 days of vancomycin and Cefepime.  He was discharged on Flagyl, doxycycline, and Levaquin.  He was admitted to [facility] from 10/08/2017 - 10/16/2017 with left lower extremity cellulitis.  He was treated with Cefepime and vancomycin.  Imaging revealed significant improvement in adenopathy with decreased pelvic sidewall disease and decreased bulk at aortic bifurcation.  He was felt to have venous congestion syndrome with skin lesions due to congestion.   He was admitted to [facility] oncology floor from 10/21/2017 - 10/26/2017 for inpatient chemotherapy (cycle #2 TIP).  Was seen in consult by Dr. Tsosie Billing (infectious disease) who felt the patient had been treated adequately with antibiotic coverage and not require follow-up further treatment for his left lower extremity cellulitis.  CBC on the day of discharge revealed a WBC 4700 ([place] 4600).  Hemoglobin 10.0, hematocrit 28.0, MCV 91.4, and platelets 371,000.    He was admitted to [facility] from 11/05/2017 - 11/09/2017 for cellulitis. He was treated with Cefepime and vancomycin.  He began oral acyclovir for herpetic labialis.  Skin biopsy revealed neutrophilic dermatitis. Cultures were negative.  He was admitted to [facility] from 11/05/2017 - 11/09/2017 for cellulitis.  He was developing similar lower extremity skin lesions following his first cycle of chemotherapy.  Xarelto was held as he was thrombocytopenic (nadir platelet count 39,000).  He was treated with heparin.  Lower extremity duplex revealed no clot.  He was treated with Cefepime and vancomycin.  He was discharged on Augmentin.  He received oral acyclovir for herpetic labialis.  He underwent IVC filterplacement, percutaneous  angioplastyand stent placementof the left common iliac vein and left external iliac vein on 10/13/2017.  He was discharged on Flagyl, doxycycline, and Levaquin.  Hypercoagulable work-up on 10/12/2017 negative for the following: Factor V Leiden, prothrombin gene mutation, lupus anticoagulant, anticardiolipin antibodies, beta-2 glycoprotein, protein C activity/antigen, protein S activity/antigen. ATIII was 54% (75-120) on heparin.  Right foot MRI on 11/17/2017 revealed artifact in the plantar soft tissues over the first metatarsal head likely representing metallic foreign bodies as seen on previous radiographs.  There was an area of increased T2 signal intensity in the plantar aspect of the first metatarsal head with focal enhancement may indicate an area of focal osteomyelitis.  There was no soft tissue abscess.  He has a right lower molar abscess.   Symptomatically, he he has severe pain associated with a right lower molar abscess.  He has right foot pain (s/p nail puncture) which is affecting his gait.   Plan: 1.   Recurrent testicular cancer - treatment ongoing Counts have recovered. Plan for cycle #3 TIP chemotherapy on 11/22/2017. Inpatient nursing and pharmacy aware. Discussed bullous skin lesions with [facility] oncology.  They have not experienced any patients with this presentation on TIP.  Recommend proceeding with next cycle.  2.   Cellulitis and skin lesions -  Send ANA with reflex, antineutrophilic cytoplasmic antibodies, RF, hepatitis B core antibody, hepatitis C antibody, SPEP today. Cellulitis has resolved.  Augmentin is complete. Skin biopsy pathology revealed neutrophilic dermatitis. Phone follow-up with Dr. Nehemiah Massed. Discuss with ID prophylactic antibiotic coverage through nadir. 3.   Herpetic lip lesion:  Improving on acyclovir. 4.   Left lower extremity DVT- chronic  Xarelto currently on hold secondary to planned dental extraction.  Forms completed for dental  extraction.  Restart Xarelto evening after procedure or next day based on dental assessment. 5.   Right lower molar abscess-ongoing  Dental extraction tomorrow at [place].  Rx: amoxicillin 500 mg po q 6 hours per Dr. Eugenie Birks. 6.   Right foot pain s/p nail puncture:  MRI findings reviewed.  Concern for metal fragments and possible early osteomyelitis.  Discussed with orthopedics today.  Recommend evaluation by podiatry today.  Urgent podiatry consult today at 10:30 AM with Dr. Cleda Mccreedy. 7.   Electrolyte wasting:  Refill potassium chloride today  Continue magnesium oxide. 8.   Pain and toxicology:  Chronic pain worsened by dental abscess and right foot issues.  Refill oxycodone. 9.   RTC on 11/22/2017 for MD assessment, labs (CBC with diff, CMP, Mg), and admission for cycle #3 TIP chemotherapy.  A total of (> 25) minutes of face-to-face time was spent with the patient with greater than 50% of that time in counseling and care-coordination.    [name], MD  11/18/2017, 9:44 AM

## 2017-11-18 NOTE — Telephone Encounter (Signed)
Called patient's mother to inform her that prescriptions have been sent to pharmacy.

## 2017-11-19 ENCOUNTER — Telehealth: Payer: Self-pay | Admitting: *Deleted

## 2017-11-19 LAB — RHEUMATOID FACTOR: Rhuematoid fact SerPl-aCnc: 16.8 IU/mL — ABNORMAL HIGH (ref 0.0–13.9)

## 2017-11-19 LAB — ANCA TITERS
Atypical P-ANCA titer: <1:20 {titer}
C-ANCA: <1:20 {titer}
P-ANCA: <1:20 {titer}

## 2017-11-19 LAB — ANA W/REFLEX: Anti Nuclear Antibody(ANA): NEGATIVE

## 2017-11-19 NOTE — Telephone Encounter (Signed)
Called [facility] to request notes from Dr. Cleda Mccreedy regarding patient who was seen by him yesterday.  Made them aware that patient is scheduled for inpatient chemotherapy on Monday and it is imperative that note be sent for Dr. Kem Parkinson review.

## 2017-11-20 DIAGNOSIS — Z7901 Long term (current) use of anticoagulants: Secondary | ICD-10-CM | POA: Diagnosis not present

## 2017-11-20 DIAGNOSIS — F17210 Nicotine dependence, cigarettes, uncomplicated: Secondary | ICD-10-CM | POA: Diagnosis not present

## 2017-11-20 DIAGNOSIS — I824Y2 Acute embolism and thrombosis of unspecified deep veins of left proximal lower extremity: Secondary | ICD-10-CM | POA: Diagnosis not present

## 2017-11-20 DIAGNOSIS — I82412 Acute embolism and thrombosis of left femoral vein: Secondary | ICD-10-CM | POA: Diagnosis not present

## 2017-11-20 DIAGNOSIS — F321 Major depressive disorder, single episode, moderate: Secondary | ICD-10-CM | POA: Diagnosis not present

## 2017-11-20 DIAGNOSIS — D649 Anemia, unspecified: Secondary | ICD-10-CM | POA: Diagnosis not present

## 2017-11-20 DIAGNOSIS — Z79891 Long term (current) use of opiate analgesic: Secondary | ICD-10-CM | POA: Diagnosis not present

## 2017-11-20 DIAGNOSIS — Z9181 History of falling: Secondary | ICD-10-CM | POA: Diagnosis not present

## 2017-11-20 DIAGNOSIS — L03116 Cellulitis of left lower limb: Secondary | ICD-10-CM | POA: Diagnosis not present

## 2017-11-20 DIAGNOSIS — F329 Major depressive disorder, single episode, unspecified: Secondary | ICD-10-CM | POA: Diagnosis not present

## 2017-11-20 DIAGNOSIS — K219 Gastro-esophageal reflux disease without esophagitis: Secondary | ICD-10-CM | POA: Diagnosis not present

## 2017-11-20 DIAGNOSIS — C6290 Malignant neoplasm of unspecified testis, unspecified whether descended or undescended: Secondary | ICD-10-CM | POA: Diagnosis not present

## 2017-11-20 DIAGNOSIS — J45909 Unspecified asthma, uncomplicated: Secondary | ICD-10-CM | POA: Diagnosis not present

## 2017-11-20 DIAGNOSIS — Z792 Long term (current) use of antibiotics: Secondary | ICD-10-CM | POA: Diagnosis not present

## 2017-11-20 DIAGNOSIS — L988 Other specified disorders of the skin and subcutaneous tissue: Secondary | ICD-10-CM | POA: Diagnosis not present

## 2017-11-20 DIAGNOSIS — Z452 Encounter for adjustment and management of vascular access device: Secondary | ICD-10-CM | POA: Diagnosis not present

## 2017-11-20 DIAGNOSIS — D6481 Anemia due to antineoplastic chemotherapy: Secondary | ICD-10-CM | POA: Diagnosis not present

## 2017-11-20 DIAGNOSIS — L982 Febrile neutrophilic dermatosis [Sweet]: Secondary | ICD-10-CM | POA: Diagnosis not present

## 2017-11-20 LAB — PROTEIN ELECTROPHORESIS, SERUM
A/G Ratio: 1.3 (ref 0.7–1.7)
Albumin ELP: 4.1 g/dL (ref 2.9–4.4)
Alpha-1-Globulin: 0.3 g/dL (ref 0.0–0.4)
Alpha-2-Globulin: 0.7 g/dL (ref 0.4–1.0)
Beta Globulin: 1.3 g/dL (ref 0.7–1.3)
Gamma Globulin: 0.9 g/dL (ref 0.4–1.8)
Globulin, Total: 3.2 g/dL (ref 2.2–3.9)
Total Protein ELP: 7.3 g/dL (ref 6.0–8.5)

## 2017-11-21 DIAGNOSIS — S91341A Puncture wound with foreign body, right foot, initial encounter: Secondary | ICD-10-CM | POA: Insufficient documentation

## 2017-11-21 DIAGNOSIS — E876 Hypokalemia: Secondary | ICD-10-CM | POA: Insufficient documentation

## 2017-11-21 NOTE — Discharge Summary (Signed)
[facility] NAME: Benjiman Sedgwick    MR#:  [id number]  DATE OF BIRTH:  [birth year]  DATE OF ADMISSION:  11/05/2017 ADMITTING PHYSICIAN: Gorden Harms, MD  DATE OF DISCHARGE: 11/09/2017  5:28 PM  PRIMARY CARE PHYSICIAN: Patient, No Pcp Per   ADMISSION DIAGNOSIS:  Cellulitis sepsis  DISCHARGE DIAGNOSIS:  Active Problems:   Sepsis ([place])   Anemia due to antineoplastic chemotherapy   Chronic anticoagulation   History of DVT (deep vein thrombosis)   SECONDARY DIAGNOSIS:   Past Medical History:  Diagnosis Date  . Asthma    AS A CHILD-NO INHALERS  . Cancer ([place])    testicular cancer 11/2016 per pt   . DVT (deep venous thrombosis) ([place])   . GERD (gastroesophageal reflux disease)    TUMS PRN     ADMITTING HISTORY  HISTORY OF PRESENT ILLNESS: Demonta Wombles  is a 27 y.o. male with a known history of testicular cancer on chemotherapy, GERD, DVT w/ IVC filter in place, recent mechanical thrombectomy status post stent placement by vascular surgery with subsequent development of left thigh skin blistering/ulcerations, history of left thigh cellulitis-hospitalized twice for this issue/discharge August 13, accepted as a direct admission by oncology for concern for sepsis, left leg cellulitis, patient noted to be febrile to 101/tachycardic to 120 with low normal blood pressure in the oncology clinic, patient accepted as a direct admission, work-up thus far noted for magnesium 1.3, sodium 132, left lower extremity ultrasound negative for clot, hemoglobin 7 down from 7.6, platelet count 39, patient only complaining of left lower extremity pain, patient now being admitted for acute possible sepsis secondary to left lower extremity recurrent leg cellulitis.  HOSPITAL COURSE:   *  Left lower extremity cellulitis vs drug reaction On IV antibiotics in the hospital.   Cultures have been negative.  Infectious disease consulted.  Advised discharge  home on Augmentin.  Skin biopsy done during hospitalization.  Results pending. Seen by dermatology during hospitalization.  Thought to have impetigo.  * Testicular cancer on chemotherapy Follows with Dr. Mike Gip  *Chronic left thigh skin lesions Healing   *Pancytopenia secondary to chemotherapy  *Left lower extremity DVT. Xarelto held due to thrombocytopenia.  Thrombocytopenia has resolved.  Initially started on heparin drip while waiting for skin biopsy.  After skin biopsy patient has been resumed on Xarelto.  Follow-up with oncology and dermatology regarding skin biopsy results and further management.  CONSULTS OBTAINED:  Treatment Team:  [name], MD Earlie Server, MD Ralene Bathe, MD  DRUG ALLERGIES:  No Known Allergies  DISCHARGE MEDICATIONS:   Allergies as of 11/09/2017   No Known Allergies     Medication List    STOP taking these medications   silver sulfADIAZINE 1 % cream Commonly known as:  SILVADENE     TAKE these medications   acetaminophen 500 MG tablet Commonly known as:  TYLENOL Take 500-1,000 mg by mouth every 6 (six) hours as needed for mild pain or fever.   CYMBALTA 60 MG capsule Generic drug:  DULoxetine Take 1 tablet by mouth daily.   dexamethasone 4 MG tablet Commonly known as:  DECADRON Take 8 mg by mouth daily. Take 8 mg by mouth once daily for three days after chemo to prevent nausea   gabapentin 300 MG capsule Commonly known as:  NEURONTIN Take 1 capsule (300 mg total) by mouth at bedtime.   loratadine 10 MG tablet Commonly known as:  CLARITIN Take  1 tablet (10 mg total) by mouth daily.   magnesium oxide 400 MG tablet Commonly known as:  MAG-OX Take 1 tablet by mouth daily.   mupirocin cream 2 % Commonly known as:  BACTROBAN Apply topically 3 (three) times daily.   nicotine 21 mg/24hr patch Commonly known as:  NICODERM CQ - dosed in mg/24 hours Place 21 mg onto the skin daily.   ondansetron 4 MG  disintegrating tablet Commonly known as:  ZOFRAN-ODT Take 2 tablets by mouth every 8 (eight) hours as needed for nausea.   oxyCODONE 10 mg 12 hr tablet Commonly known as:  OXYCONTIN Take 1 tablet (10 mg total) by mouth every 12 (twelve) hours. What changed:  Another medication with the same name was removed. Continue taking this medication, and follow the directions you see here.   prochlorperazine 10 MG tablet Commonly known as:  COMPAZINE Take 1 tablet by mouth every 6 (six) hours as needed (nausea/vomiting).   XARELTO 20 MG Tabs tablet Generic drug:  rivaroxaban Take 1 tablet by mouth daily.     ASK your doctor about these medications   amoxicillin-clavulanate 875-125 MG tablet Commonly known as:  AUGMENTIN Take 1 tablet by mouth 2 (two) times daily for 7 days. Ask about: Should I take this medication?   polyethylene glycol packet Commonly known as:  MIRALAX / GLYCOLAX Take 17 g by mouth daily as needed for mild constipation. Ask about: Should I take this medication?       Today   VITAL SIGNS:  Blood pressure 116/78, pulse 60, temperature 98.1 F (36.7 C), temperature source Oral, resp. rate 12, height 6\' 3"  (1.905 m), weight 127.1 kg, SpO2 98 %.  I/O:  No intake or output data in the 24 hours ending 11/21/17 1146  PHYSICAL EXAMINATION:  Physical Exam  GENERAL:  27 y.o.-year-old patient lying in the bed with no acute distress.  LUNGS: Normal breath sounds bilaterally, no wheezing, rales,rhonchi or crepitation. No use of accessory muscles of respiration.  CARDIOVASCULAR: S1, S2 normal. No murmurs, rubs, or gallops.  ABDOMEN: Soft, non-tender, non-distended. Bowel sounds present. No organomegaly or mass.  NEUROLOGIC: Moves all 4 extremities. PSYCHIATRIC: The patient is alert and oriented x 3.  SKIN: Erythema and warmth of bilateral lower committee areas with lesions  DATA REVIEW:   CBC Recent Labs  Lab 11/16/17 0948  WBC 5.2  HGB 9.0*  HCT 27.5*  PLT 490*     Chemistries  Recent Labs  Lab 11/16/17 0948  NA 139  K 4.2  CL 103  CO2 26  GLUCOSE 119*  BUN 14  CREATININE 0.67  CALCIUM 9.2  MG 2.0  AST 18  ALT 14  ALKPHOS 52  BILITOT 0.4    Cardiac Enzymes No results for input(s): TROPONINI in the last 168 hours.  Microbiology Results  Results for orders placed or performed during the hospital encounter of 11/05/17  Aerobic/Anaerobic Culture (surgical/deep wound)     Status: None   Collection Time: 11/09/17 11:24 AM  Result Value Ref Range Status   Specimen Description   Final    LEG LEFT LEG Performed at [facility], [address] [place], [place] [address]    Special Requests   Final    NONE Performed at [facility], [address] [place], [place] [address]    Gram Stain   Final    FEW WBC PRESENT, PREDOMINANTLY PMN NO ORGANISMS SEEN    Culture   Final    No  growth aerobically or anaerobically. Performed at [facility], [place] [address] [place], [place] [address]    Report Status 11/14/2017 FINAL  Final  MRSA PCR Screening     Status: None   Collection Time: 11/09/17  2:03 PM  Result Value Ref Range Status   MRSA by PCR NEGATIVE NEGATIVE Final    Comment:        The GeneXpert MRSA Assay (FDA approved for NASAL specimens only), is one component of a comprehensive MRSA colonization surveillance program. It is not intended to diagnose MRSA infection nor to guide or monitor treatment for MRSA infections. Performed at [facility], [address], [place], [place] [address]     RADIOLOGY:  No results found.  Follow up with PCP in 1 week.  Management plans discussed with the patient, family and they are in agreement.  CODE STATUS:  Code Status History    Date Active Date Inactive Code Status Order ID Comments User Context   11/05/2017 1442 11/09/2017 2046 Full Code [id number]  Gorden Harms, MD Inpatient   10/21/2017 1110 10/26/2017 1635 Full Code [id number]   Vaughan Basta, MD Inpatient   10/08/2017 1621 10/16/2017 1836 Full Code [id number]  Vaughan Basta, MD Inpatient      TOTAL TIME TAKING CARE OF THIS PATIENT ON DAY OF DISCHARGE: more than 30 minutes.   Leia Alf Jazzalynn Rhudy M.D on 11/21/2017 at 11:46 AM  Between 7am to 6pm - Pager - [phone]  After 6pm go to [email or web address] - password [email or web address] [facility]  Office  [phone]  CC: Primary care physician; Patient, No Pcp Per  Note: This dictation was prepared with Dragon dictation along with smaller phrase technology. Any transcriptional errors that result from this process are unintentional.

## 2017-11-22 ENCOUNTER — Inpatient Hospital Stay: Payer: BLUE CROSS/BLUE SHIELD

## 2017-11-22 ENCOUNTER — Encounter: Payer: Self-pay | Admitting: Hematology and Oncology

## 2017-11-22 ENCOUNTER — Inpatient Hospital Stay (HOSPITAL_BASED_OUTPATIENT_CLINIC_OR_DEPARTMENT_OTHER): Payer: BLUE CROSS/BLUE SHIELD | Admitting: Hematology and Oncology

## 2017-11-22 ENCOUNTER — Other Ambulatory Visit: Payer: Self-pay | Admitting: Hematology and Oncology

## 2017-11-22 ENCOUNTER — Inpatient Hospital Stay
Admission: RE | Admit: 2017-11-22 | Discharge: 2017-11-27 | DRG: 848 | Disposition: A | Discharge status: Home / Self Care | Payer: BLUE CROSS/BLUE SHIELD | Source: Ambulatory Visit | Attending: Internal Medicine | Admitting: Internal Medicine

## 2017-11-22 ENCOUNTER — Other Ambulatory Visit: Payer: Self-pay

## 2017-11-22 ENCOUNTER — Telehealth: Payer: Self-pay | Admitting: *Deleted

## 2017-11-22 VITALS — BP 131/85 | HR 80 | Temp 97.2°F | Resp 18 | Wt 267.1 lb

## 2017-11-22 DIAGNOSIS — L309 Dermatitis, unspecified: Secondary | ICD-10-CM

## 2017-11-22 DIAGNOSIS — T451X5A Adverse effect of antineoplastic and immunosuppressive drugs, initial encounter: Secondary | ICD-10-CM | POA: Diagnosis not present

## 2017-11-22 DIAGNOSIS — Z5111 Encounter for antineoplastic chemotherapy: Secondary | ICD-10-CM

## 2017-11-22 DIAGNOSIS — Z803 Family history of malignant neoplasm of breast: Secondary | ICD-10-CM

## 2017-11-22 DIAGNOSIS — Z7901 Long term (current) use of anticoagulants: Secondary | ICD-10-CM

## 2017-11-22 DIAGNOSIS — E876 Hypokalemia: Secondary | ICD-10-CM | POA: Diagnosis present

## 2017-11-22 DIAGNOSIS — S91341D Puncture wound with foreign body, right foot, subsequent encounter: Secondary | ICD-10-CM

## 2017-11-22 DIAGNOSIS — I824Y2 Acute embolism and thrombosis of unspecified deep veins of left proximal lower extremity: Secondary | ICD-10-CM

## 2017-11-22 DIAGNOSIS — B001 Herpesviral vesicular dermatitis: Secondary | ICD-10-CM

## 2017-11-22 DIAGNOSIS — C6290 Malignant neoplasm of unspecified testis, unspecified whether descended or undescended: Secondary | ICD-10-CM | POA: Diagnosis present

## 2017-11-22 DIAGNOSIS — Z79899 Other long term (current) drug therapy: Secondary | ICD-10-CM

## 2017-11-22 DIAGNOSIS — K047 Periapical abscess without sinus: Secondary | ICD-10-CM

## 2017-11-22 DIAGNOSIS — Z95828 Presence of other vascular implants and grafts: Secondary | ICD-10-CM

## 2017-11-22 DIAGNOSIS — R000 Tachycardia, unspecified: Secondary | ICD-10-CM | POA: Diagnosis not present

## 2017-11-22 DIAGNOSIS — C792 Secondary malignant neoplasm of skin: Secondary | ICD-10-CM

## 2017-11-22 DIAGNOSIS — I82592 Chronic embolism and thrombosis of other specified deep vein of left lower extremity: Secondary | ICD-10-CM | POA: Diagnosis not present

## 2017-11-22 DIAGNOSIS — F17210 Nicotine dependence, cigarettes, uncomplicated: Secondary | ICD-10-CM | POA: Diagnosis present

## 2017-11-22 DIAGNOSIS — K219 Gastro-esophageal reflux disease without esophagitis: Secondary | ICD-10-CM | POA: Diagnosis present

## 2017-11-22 DIAGNOSIS — Z720 Tobacco use: Secondary | ICD-10-CM | POA: Diagnosis not present

## 2017-11-22 DIAGNOSIS — C6210 Malignant neoplasm of unspecified descended testis: Secondary | ICD-10-CM

## 2017-11-22 DIAGNOSIS — Z86718 Personal history of other venous thrombosis and embolism: Secondary | ICD-10-CM | POA: Diagnosis not present

## 2017-11-22 DIAGNOSIS — D61810 Antineoplastic chemotherapy induced pancytopenia: Secondary | ICD-10-CM | POA: Diagnosis not present

## 2017-11-22 DIAGNOSIS — C6212 Malignant neoplasm of descended left testis: Secondary | ICD-10-CM | POA: Diagnosis not present

## 2017-11-22 DIAGNOSIS — R509 Fever, unspecified: Secondary | ICD-10-CM | POA: Diagnosis not present

## 2017-11-22 DIAGNOSIS — B009 Herpesviral infection, unspecified: Secondary | ICD-10-CM | POA: Diagnosis not present

## 2017-11-22 DIAGNOSIS — M795 Residual foreign body in soft tissue: Secondary | ICD-10-CM | POA: Diagnosis not present

## 2017-11-22 DIAGNOSIS — Z716 Tobacco abuse counseling: Secondary | ICD-10-CM | POA: Diagnosis not present

## 2017-11-22 DIAGNOSIS — L03116 Cellulitis of left lower limb: Secondary | ICD-10-CM | POA: Diagnosis not present

## 2017-11-22 DIAGNOSIS — Z808 Family history of malignant neoplasm of other organs or systems: Secondary | ICD-10-CM | POA: Diagnosis not present

## 2017-11-22 DIAGNOSIS — D6481 Anemia due to antineoplastic chemotherapy: Secondary | ICD-10-CM | POA: Diagnosis present

## 2017-11-22 DIAGNOSIS — L982 Febrile neutrophilic dermatosis [Sweet]: Secondary | ICD-10-CM

## 2017-11-22 DIAGNOSIS — D649 Anemia, unspecified: Secondary | ICD-10-CM | POA: Diagnosis not present

## 2017-11-22 DIAGNOSIS — Z800 Family history of malignant neoplasm of digestive organs: Secondary | ICD-10-CM

## 2017-11-22 DIAGNOSIS — Z9079 Acquired absence of other genital organ(s): Secondary | ICD-10-CM

## 2017-11-22 DIAGNOSIS — G620 Drug-induced polyneuropathy: Secondary | ICD-10-CM | POA: Diagnosis not present

## 2017-11-22 DIAGNOSIS — Z7689 Persons encountering health services in other specified circumstances: Secondary | ICD-10-CM | POA: Diagnosis not present

## 2017-11-22 LAB — CBC WITH DIFFERENTIAL/PLATELET
Basophils Absolute: 0.1 10*3/uL (ref 0–0.1)
Basophils Relative: 1 %
Eosinophils Absolute: 0 10*3/uL (ref 0–0.7)
Eosinophils Relative: 1 %
HCT: 26.8 % — ABNORMAL LOW (ref 40.0–52.0)
Hemoglobin: 9.1 g/dL — ABNORMAL LOW (ref 13.0–18.0)
Lymphocytes Relative: 20 %
Lymphs Abs: 0.8 10*3/uL — ABNORMAL LOW (ref 1.0–3.6)
MCH: 32.4 pg (ref 26.0–34.0)
MCHC: 33.9 g/dL (ref 32.0–36.0)
MCV: 95.7 fL (ref 80.0–100.0)
Monocytes Absolute: 0.8 10*3/uL (ref 0.2–1.0)
Monocytes Relative: 17 %
Neutro Abs: 2.6 10*3/uL (ref 1.4–6.5)
Neutrophils Relative %: 61 %
Platelets: 410 10*3/uL (ref 150–440)
RBC: 2.8 MIL/uL — ABNORMAL LOW (ref 4.40–5.90)
RDW: 18.7 % — ABNORMAL HIGH (ref 11.5–14.5)
WBC: 4.3 10*3/uL (ref 3.8–10.6)

## 2017-11-22 LAB — COMPREHENSIVE METABOLIC PANEL
ALT: 18 U/L (ref 0–44)
AST: 17 U/L (ref 15–41)
Albumin: 3.7 g/dL (ref 3.5–5.0)
Alkaline Phosphatase: 44 U/L (ref 38–126)
Anion gap: 5 (ref 5–15)
BUN: 10 mg/dL (ref 6–20)
CO2: 27 mmol/L (ref 22–32)
Calcium: 9.2 mg/dL (ref 8.9–10.3)
Chloride: 107 mmol/L (ref 98–111)
Creatinine, Ser: 0.7 mg/dL (ref 0.61–1.24)
GFR calc Af Amer: >60 mL/min (ref >60)
GFR calc non Af Amer: >60 mL/min (ref >60)
Glucose, Bld: 117 mg/dL — ABNORMAL HIGH (ref 70–99)
Potassium: 4.2 mmol/L (ref 3.5–5.1)
Sodium: 139 mmol/L (ref 135–145)
Total Bilirubin: 0.5 mg/dL (ref 0.3–1.2)
Total Protein: 6.8 g/dL (ref 6.5–8.1)

## 2017-11-22 LAB — MAGNESIUM: Magnesium: 2 mg/dL (ref 1.7–2.4)

## 2017-11-22 LAB — CRYOGLOBULIN

## 2017-11-22 MED ORDER — GABAPENTIN 300 MG PO CAPS
300.0000 mg | ORAL_CAPSULE | Freq: Every day | ORAL | Status: DC
  Administered 2017-11-22 – 2017-11-26 (×5): 300 mg via ORAL
  Filled 2017-11-22 (×5): qty 1

## 2017-11-22 MED ORDER — OXYCODONE HCL ER 10 MG PO T12A
10.0000 mg | EXTENDED_RELEASE_TABLET | Freq: Two times a day (BID) | ORAL | Status: DC
  Administered 2017-11-22 – 2017-11-27 (×10): 10 mg via ORAL
  Filled 2017-11-22 (×11): qty 1

## 2017-11-22 MED ORDER — LORATADINE 10 MG PO TABS
10.0000 mg | ORAL_TABLET | Freq: Every day | ORAL | Status: DC
  Administered 2017-11-23 – 2017-11-27 (×5): 10 mg via ORAL
  Filled 2017-11-22 (×6): qty 1

## 2017-11-22 MED ORDER — SODIUM CHLORIDE 0.9% FLUSH
3.0000 mL | Freq: Two times a day (BID) | INTRAVENOUS | Status: DC
  Administered 2017-11-22 – 2017-11-26 (×7): 3 mL via INTRAVENOUS

## 2017-11-22 MED ORDER — AMOXICILLIN 500 MG PO CAPS
500.0000 mg | ORAL_CAPSULE | Freq: Four times a day (QID) | ORAL | Status: DC
  Administered 2017-11-22 – 2017-11-26 (×17): 500 mg via ORAL
  Filled 2017-11-22 (×19): qty 1

## 2017-11-22 MED ORDER — RIVAROXABAN 20 MG PO TABS
20.0000 mg | ORAL_TABLET | Freq: Every day | ORAL | Status: DC
  Administered 2017-11-22 – 2017-11-27 (×6): 20 mg via ORAL
  Filled 2017-11-22 (×6): qty 1

## 2017-11-22 MED ORDER — FAMOTIDINE IN NACL 20-0.9 MG/50ML-% IV SOLN
20.0000 mg | Freq: Once | INTRAVENOUS | Status: AC
  Administered 2017-11-22: 20 mg via INTRAVENOUS
  Filled 2017-11-22: qty 50

## 2017-11-22 MED ORDER — DIPHENHYDRAMINE HCL 50 MG/ML IJ SOLN
50.0000 mg | Freq: Once | INTRAMUSCULAR | Status: AC
  Administered 2017-11-22: 16:00:00 50 mg via INTRAVENOUS
  Filled 2017-11-22 (×2): qty 1

## 2017-11-22 MED ORDER — OXYCODONE HCL 5 MG PO TABS
10.0000 mg | ORAL_TABLET | Freq: Three times a day (TID) | ORAL | Status: DC | PRN
  Administered 2017-11-22 – 2017-11-23 (×3): 10 mg via ORAL
  Filled 2017-11-22 (×3): qty 2

## 2017-11-22 MED ORDER — SODIUM CHLORIDE 0.9 % IV SOLN
600.0000 mg | Freq: Once | INTRAVENOUS | Status: AC
  Administered 2017-11-22: 600 mg via INTRAVENOUS
  Filled 2017-11-22: qty 100

## 2017-11-22 MED ORDER — SENNOSIDES-DOCUSATE SODIUM 8.6-50 MG PO TABS: 1.0000 | ORAL_TABLET | Freq: Every evening | ORAL | Status: DC | PRN

## 2017-11-22 MED ORDER — SODIUM CHLORIDE 0.9 % IV SOLN
250.0000 mL | INTRAVENOUS | Status: DC | PRN
  Administered 2017-11-25 – 2017-11-26 (×4): 250 mL via INTRAVENOUS

## 2017-11-22 MED ORDER — ONDANSETRON 8 MG PO TBDP
8.0000 mg | ORAL_TABLET | Freq: Three times a day (TID) | ORAL | Status: DC | PRN
  Administered 2017-11-22: 14:00:00 8 mg via ORAL
  Filled 2017-11-22 (×2): qty 1

## 2017-11-22 MED ORDER — DEXAMETHASONE 4 MG PO TABS
8.0000 mg | ORAL_TABLET | Freq: Every day | ORAL | Status: DC
  Administered 2017-11-22 – 2017-11-26 (×5): 8 mg via ORAL
  Filled 2017-11-22 (×6): qty 2

## 2017-11-22 MED ORDER — MAGNESIUM OXIDE 400 (241.3 MG) MG PO TABS
400.0000 mg | ORAL_TABLET | Freq: Every day | ORAL | Status: DC
  Administered 2017-11-23 – 2017-11-27 (×5): 400 mg via ORAL
  Filled 2017-11-22 (×6): qty 1

## 2017-11-22 MED ORDER — SODIUM CHLORIDE 0.9 % IV SOLN
Freq: Once | INTRAVENOUS | Status: AC
  Administered 2017-11-22: 16:00:00 8 mg via INTRAVENOUS
  Filled 2017-11-22: qty 4

## 2017-11-22 MED ORDER — SODIUM CHLORIDE 0.9% FLUSH: 3.0000 mL | INTRAVENOUS | Status: DC | PRN

## 2017-11-22 MED ORDER — ACETAMINOPHEN 500 MG PO TABS: 500.0000 mg | ORAL_TABLET | Freq: Four times a day (QID) | ORAL | Status: DC | PRN

## 2017-11-22 MED ORDER — PROCHLORPERAZINE MALEATE 10 MG PO TABS
10.0000 mg | ORAL_TABLET | Freq: Four times a day (QID) | ORAL | Status: DC | PRN
  Administered 2017-11-22 – 2017-11-27 (×2): 10 mg via ORAL
  Filled 2017-11-22 (×3): qty 1

## 2017-11-22 MED ORDER — ACYCLOVIR 200 MG PO CAPS
400.0000 mg | ORAL_CAPSULE | Freq: Three times a day (TID) | ORAL | Status: DC
Start: 2017-11-22 — End: 2017-11-26
  Administered 2017-11-22 – 2017-11-26 (×12): 400 mg via ORAL
  Filled 2017-11-22 (×15): qty 2

## 2017-11-22 MED ORDER — DULOXETINE HCL 30 MG PO CPEP
60.0000 mg | ORAL_CAPSULE | Freq: Every day | ORAL | Status: DC
  Administered 2017-11-23 – 2017-11-27 (×5): 60 mg via ORAL
  Filled 2017-11-22 (×6): qty 2

## 2017-11-22 MED ORDER — SILVER SULFADIAZINE 1 % EX CREA
TOPICAL_CREAM | Freq: Every day | CUTANEOUS | Status: DC
  Administered 2017-11-22: 18:00:00 via TOPICAL
  Filled 2017-11-22: qty 85

## 2017-11-22 MED ORDER — NICOTINE 21 MG/24HR TD PT24
21.0000 mg | MEDICATED_PATCH | Freq: Every day | TRANSDERMAL | Status: DC
  Administered 2017-11-23 – 2017-11-26 (×4): 21 mg via TRANSDERMAL
  Filled 2017-11-22 (×6): qty 1

## 2017-11-22 NOTE — Progress Notes (Signed)
[facility] day:  11/22/2017  Chief Complaint: Edgar [name] is a 27 y.o. male with recurrent testicular cancer who is seen for assessment prior to cycle #3 TIP.  HPI: The patient was last seen in the medical oncology clinic on 11/18/2017.  At that time, he had severe pain associated with a right lower molar abscess.  He had right foot pain (s/p nail puncture) which was affecting his gait.   He underwent dental extraction on 11/19/2017.  Xarelto was held x 2 days.  He restarted Xarelto on 11/19/2017.  He is on amoxicillin coverage.  He was seen by Dr. Cleda Mccreedy of podiatry on 11/18/2017 for possible retained metal fragments and early osteomyelitis noted on foot MRI.  There were no apparent metal fragments.  He was not felt to have osteomyelitis.  He is scheduled for follow-up in 2 weeks.  During the interim, he has done well. He feels better since his dental extraction.  Pain has decreased to 4 out of 10.  Left lower extremity lesion is improving.  His foot remains sore.  His upper lip herpetic lesion has almost resolved.  He denies any fever.  He is ready for admission.   Past Medical History:  Diagnosis Date  . Asthma    AS A CHILD-NO INHALERS  . Cancer ([place])    testicular cancer 11/2016 per pt   . DVT (deep venous thrombosis) ([place])   . GERD (gastroesophageal reflux disease)    TUMS PRN    Past Surgical History:  Procedure Laterality Date  . ANKLE FRACTURE SURGERY Right 2004  . FINE NEEDLE ASPIRATION BIOPSY N/A 11/09/2017   Procedure: FINE NEEDLE ASPIRATION BIOPSY;  Surgeon: [name], MD;  Location: [facility];  Service: Cardiovascular;  Laterality: N/A;  . ORCHIECTOMY Left 11/26/2015   Procedure: ORCHIECTOMY;  Surgeon: [name], MD;  Location: [facility];  Service: Urology;  Laterality: Left;  radical/Inguinal approach  . PERIPHERAL VASCULAR CATHETERIZATION N/A 12/16/2015   Procedure: [name] Cath Insertion;  Surgeon:  [name], MD;  Location: [facility];  Service: Cardiovascular;  Laterality: N/A;  . PERIPHERAL VASCULAR THROMBECTOMY Left 10/13/2017   Procedure: PERIPHERAL VASCULAR THROMBECTOMY;  Surgeon: [name], MD;  Location: [facility];  Service: Cardiovascular;  Laterality: Left;  . WISDOM TOOTH EXTRACTION  2017    Family History  Problem Relation Age of Onset  . Skin cancer Mother   . Breast cancer Maternal Grandmother   . Colon cancer Maternal Grandfather   . Diabetes Maternal Grandfather   . Emphysema Father   . Mental illness Sister        anxiety  . Stroke Paternal Grandmother   . Prostate cancer Neg Hx   . Kidney cancer Neg Hx   . Bladder Cancer Neg Hx     Social History:  reports that he has been smoking cigarettes. He has a 4.00 pack-year smoking history. He has quit using smokeless tobacco.  His smokeless tobacco use included snuff. He reports that he has current or past drug history. Drug: Marijuana. He reports that he does not drink alcohol.  He smokes 1/2 pack a day.  He smokes marijuana.  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  He has a 80 year-old-daughter named Edgar [name]. The patient's mother's cell phone number is [phone].  The patient lives in [place]. He is accompanied by his mother today.   Allergies: No Known  Allergies  Current Medications: Current Outpatient Medications  Medication Sig Dispense Refill  . acetaminophen (TYLENOL) 500 MG tablet Take 500-1,000 mg by mouth every 6 (six) hours as needed for mild pain or fever.     Marland Kitchen acyclovir (ZOVIRAX) 400 MG tablet Take 400 mg by mouth 3 (three) times daily.    Marland Kitchen amoxicillin (AMOXIL) 500 MG tablet Take 1 tablet (500 mg total) by mouth every 6 (six) hours. 40 tablet 0  . dexamethasone (DECADRON) 4 MG tablet Take 8 mg by mouth daily. Take 8 mg by mouth once daily for three days after chemo to prevent nausea    . DULoxetine (CYMBALTA) 60 MG capsule Take 1 tablet by mouth  daily.    Marland Kitchen gabapentin (NEURONTIN) 300 MG capsule Take 1 capsule (300 mg total) by mouth at bedtime. 30 capsule 0  . levofloxacin (LEVAQUIN) 500 MG tablet Take 500 mg by mouth daily.    Marland Kitchen loratadine (CLARITIN) 10 MG tablet Take 1 tablet (10 mg total) by mouth daily. 90 tablet 1  . magnesium oxide (MAG-OX) 400 MG tablet Take 1 tablet by mouth daily.    . nicotine (NICODERM CQ - DOSED IN MG/24 HOURS) 21 mg/24hr patch Place 21 mg onto the skin daily.     . ondansetron (ZOFRAN-ODT) 4 MG disintegrating tablet Take 2 tablets by mouth every 8 (eight) hours as needed for nausea.     Marland Kitchen oxyCODONE (OXYCONTIN) 10 mg 12 hr tablet Take 1 tablet (10 mg total) by mouth every 12 (twelve) hours. 10 tablet 0  . Oxycodone HCl 10 MG TABS Take 1 tablet (10 mg total) by mouth every 8 (eight) hours as needed. 30 tablet 0  . potassium chloride SA (K-DUR,KLOR-CON) 20 MEQ tablet Take 1 tablet (20 mEq total) by mouth 2 (two) times daily. 60 tablet 1  . prochlorperazine (COMPAZINE) 10 MG tablet Take 1 tablet by mouth every 6 (six) hours as needed (nausea/vomiting).     Alveda Reasons 20 MG TABS tablet Take 1 tablet by mouth daily.    . mupirocin cream (BACTROBAN) 2 % Apply topically 3 (three) times daily. (Patient not taking: Reported on 11/18/2017) 30 g 1   No current facility-administered medications for this visit.    Facility-Administered Medications Ordered in Other Visits  Medication Dose Route Frequency Provider Last Rate Last Dose  . heparin lock flush 100 unit/mL  500 Units Intravenous Once Corcoran, [name], MD      . sodium chloride flush (NS) 0.9 % injection 10 mL  10 mL Intravenous PRN Nolon Stalls C, MD   10 mL at 10/21/17 1008    Review of Systems  Constitutional: Negative for chills, diaphoresis, fever, malaise/fatigue and weight loss (up 2 pounds).       Feels better.  HENT: Negative for congestion, ear discharge, ear pain, hearing loss, nosebleeds, sinus pain, sore throat and tinnitus.        Right  lower molar s/p extraction.  Eyes: Negative.  Negative for blurred vision, double vision, photophobia, pain and discharge.  Respiratory: Positive for shortness of breath (with activity). Negative for cough, hemoptysis, sputum production and wheezing.   Cardiovascular: Positive for leg swelling (left lower leg wrapped). Negative for chest pain, palpitations, orthopnea and PND.  Gastrointestinal: Negative.  Negative for abdominal pain, blood in stool, constipation, diarrhea, heartburn, melena, nausea and vomiting.  Genitourinary: Negative.  Negative for dysuria, frequency, hematuria and urgency.  Musculoskeletal: Negative for back pain, falls, joint pain, myalgias and neck pain.  Right foot pain   Skin: Negative for itching. Rash: left lower leg rash improving.  Neurological: Negative for dizziness, tingling, tremors, sensory change, speech change, focal weakness and headaches.  Endo/Heme/Allergies: Negative.  Negative for environmental allergies. Does not bruise/bleed easily.  Psychiatric/Behavioral: Negative for depression. The patient is not nervous/anxious and does not have insomnia.   All other systems reviewed and are negative.  Performance status (ECOG): 1-2  Vital Signs BP 131/85 (BP Location: Left Arm, Patient Position: Sitting)   Pulse 80   Temp (!) 97.2 F (36.2 C) (Tympanic)   Resp 18   Wt 267 lb 2 oz (121.2 kg)   BMI 33.39 kg/m   Physical Exam  Constitutional: He is oriented to person, place, and time.  HENT:  Head: Normocephalic and atraumatic.  Nose: Nose normal.  Alopecia.  Poor dentition.  Right lower molar s/p extraction.  Gum healing.  Eyes: Pupils are equal, round, and reactive to light. Conjunctivae and EOM are normal. No scleral icterus.  Brown eyes.  Neck: Neck supple. No JVD present.  Cardiovascular: Normal rate, regular rhythm, normal heart sounds, intact distal pulses and normal pulses. Exam reveals no gallop and no friction rub.  No murmur  heard. Pulmonary/Chest: Effort normal and breath sounds normal. No respiratory distress. He has no wheezes. He has no rales.  Abdominal: Soft. Bowel sounds are normal. He exhibits no distension and no mass. There is no tenderness. There is no rebound and no guarding.  Musculoskeletal: Normal range of motion. He exhibits edema (left distal lower extremity wrapped with ACE bandage.) and tenderness (fingertip right thigh "knot" tender with no overlying skin changes.).  Puncture site slightly tender, with no erythema or edema.  Lymphadenopathy:    He has no cervical adenopathy.  Neurological: He is alert and oriented to person, place, and time.  Skin: Skin is warm and dry. Lesion (6 circular lesions to LEFT inner and posterior thigh-healing) and rash (Herpetic upper lip lesion, resolving) noted. No bruising noted. No erythema. No pallor.  #1: 5 circular lesions to LEFT inner thigh, and 1 circular lesions to LEFT posterior thigh - hyperpigmented.  #2: 1 circular lesion healing at top of LLE dressing with irregular central scant yellow exudate. #3  Bottom of right foot with tiny mark s/p nail puncture.  Psychiatric: Mood, affect and judgment normal.  Nursing note and vitals reviewed.  Imaging studies: 11/28/2015:  Chest, abdomen, and pelvic CT revealed no mediastinal adenopathy or suspicious pulmonary nodules.   There were 2 prominent left periaortic retroperitoneal lymph nodes (1.7 cm and 0.9 cm) concerning for testicular nodal metastasis.  There were no abnormal lymph nodes above the renal veins.  There were postsurgical change in the left hemiscrotum and left inguinal canal.  There was a 3.3 cm soft tissue abnormality anterior to the left iliopsoas muscle which could represent a metastatic lymph node (N2 lesion).  12/17/2015:  Head MRI revealed no evidence of metastatic disease.   03/30/2016:  Chest, abdomen, and pelvic CT revealed interval resolution of left abdominal/pelvic lymphadenopathy.  There  was no residual metastatic disease. 09/30/2016:  Chest, abdomen, and pelvic CT revealed no evidence of recurrent or metastatic disease.  09/10/2017:  Bilateral lower extremity duplex revealed evidence of obstruction in the left CFV, SFJ, and proximal femoral vein.  09/10/2017:  CT pelvic venogram revealed multiple retroperitoneal masses including large mass within the left iliacus concerning for metastatic disease in the setting of prior testicular cancer.  Mass in the left iliacus caused significant narrowing  of the proximal left external iliac vein. There was filling defect within the distal left external iliac vein extending into the common femoral and proximal superficial femoral veins consistent with thrombus  09/11/2017:  Chest, abdomen, and pelvic CT revealed enlarged and morphologically abnormal 4.3 x 4.9 cm left pelvic sidewall and retroperitoneal lymph nodes (measuring up to 2.9 cm) concerning for metastatic disease given history of testicular cancer. Biopsy was suggested for confirmation. Prominent left inguinal lymph nodes could be metastatic or reactive in nature.  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  There was anasarca/swelling of the left lower extremity.  There was no evidence of metastatic disease in the chest. 09/13/2017:  Head MRI revealed no evidence of metastatic disease. 11/17/2017:  Right foot MRI revealed artifact in the plantar soft tissues over the first metatarsal head likely representing metallic foreign bodies as seen on previous radiographs.  There was an area of increased T2 signal intensity in the plantar aspect of the first metatarsal head with focal enhancement may indicate an area of focal osteomyelitis.  There was no soft tissue abscess.   Office Visit on 11/22/2017  Component Date Value Ref Range Status  . Sodium 11/22/2017 139  135 - 145 mmol/L Final  . Potassium 11/22/2017 4.2  3.5 - 5.1  mmol/L Final  . Chloride 11/22/2017 107  98 - 111 mmol/L Final  . CO2 11/22/2017 27  22 - 32 mmol/L Final  . Glucose, Bld 11/22/2017 117* 70 - 99 mg/dL Final  . BUN 11/22/2017 10  6 - 20 mg/dL Final  . Creatinine, Ser 11/22/2017 0.70  0.61 - 1.24 mg/dL Final  . Calcium 11/22/2017 9.2  8.9 - 10.3 mg/dL Final  . Total Protein 11/22/2017 6.8  6.5 - 8.1 g/dL Final  . Albumin 11/22/2017 3.7  3.5 - 5.0 g/dL Final  . AST 11/22/2017 17  15 - 41 U/L Final  . ALT 11/22/2017 18  0 - 44 U/L Final  . Alkaline Phosphatase 11/22/2017 44  38 - 126 U/L Final  . Total Bilirubin 11/22/2017 0.5  0.3 - 1.2 mg/dL Final  . GFR calc non Af Amer 11/22/2017 >60  >60 mL/min Final  . GFR calc Af Amer 11/22/2017 >60  >60 mL/min Final   Comment: (NOTE) The eGFR has been calculated using the CKD EPI equation. This calculation has not been validated in all clinical situations. eGFR's persistently <60 mL/min signify possible Chronic Kidney Disease.   Georgiann Hahn gap 11/22/2017 5  5 - 15 Final   Performed at [facility], [address] [place], [place] [address]  . WBC 11/22/2017 4.3  3.8 - 10.6 K/uL Final  . RBC 11/22/2017 2.80* 4.40 - 5.90 MIL/uL Final  . Hemoglobin 11/22/2017 9.1* 13.0 - 18.0 g/dL Final  . HCT 11/22/2017 26.8* 40.0 - 52.0 % Final  . MCV 11/22/2017 95.7  80.0 - 100.0 fL Final  . MCH 11/22/2017 32.4  26.0 - 34.0 pg Final  . MCHC 11/22/2017 33.9  32.0 - 36.0 g/dL Final  . RDW 11/22/2017 18.7* 11.5 - 14.5 % Final  . Platelets 11/22/2017 410  150 - 440 K/uL Final  . Neutrophils Relative % 11/22/2017 61  % Final  . Neutro Abs 11/22/2017 2.6  1.4 - 6.5 K/uL Final  . Lymphocytes Relative 11/22/2017 20  % Final  . Lymphs Abs 11/22/2017 0.8* 1.0 - 3.6 K/uL Final  . Monocytes Relative 11/22/2017 17  % Final  .  Monocytes Absolute 11/22/2017 0.8  0.2 - 1.0 K/uL Final  . Eosinophils Relative 11/22/2017 1  % Final  . Eosinophils Absolute 11/22/2017 0.0  0 - 0.7 K/uL Final  . Basophils Relative  11/22/2017 1  % Final  . Basophils Absolute 11/22/2017 0.1  0 - 0.1 K/uL Final   Performed at [facility], [address], [place], [place] [address]  . Magnesium 11/22/2017 2.0  1.7 - 2.4 mg/dL Final   Performed at [facility], [address] [place], [place] [address]    Assessment:  TORE CARREKER is a 27 y.o. male with recurrent testicular cancer.  He presented with a left lower extremity DVT on 09/10/2017.  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  Clinical/pathologic stage is T1 N1-2 S0 M0.  He received 3 cycles of  BEP (12/30/2015 - 02/24/2016).  He received OnPro Neulasta with cycle #2 and #3.  He declined evaluation for retroperitoneal lymph node dissection (RPLND).  He declined sperm banking.  Pre surgery labs on 11/20/2015 revealed an AFP of 411.9, beta-HCG 2,669, and LDH 522 (121-224).    Tumor markers have been followed:   AFP was 411.9 (0-8.3) on 11/20/2015, 11.2 on 12/19/2015, 6.3 on 12/23/2015, 1.3 on 01/27/2016, 1.8 on 02/17/2016, 1.9 on 03/30/2016, 1.4 on 05/27/2016, 1.0 on 07/29/2016, 1.4 on 10/14/2016, 3.3 on 12/24/2016, 1 on 09/10/2017, 1 on 09/20/2017,  2.6 on 10/21/2017, and 2.1 on 11/16/2017.    Beta-HCG was 2,669 (0-3) on 11/20/2015, 2 on 12/19/2015, 1 on 12/23/2015, < 1 on 01/27/2016, < 1 on 02/17/2016, < 1 on 03/30/2016, < 1 on 05/27/2016,  < 1 on 07/29/2016, < 1 on 10/14/2016, and 3 on 12/24/2016, < 5 on 09/11/2017,  < 5 on 09/20/2017, < 1 on 10/21/2017, and < 1 on 11/16/2017.  LDH was 522 (98-192) on 11/20/2015, 179 on 12/19/2015, 160 on 12/23/2015, 202 on 01/27/2016, 156 on 02/10/2016, 161 on 02/17/2016, 172 on 03/30/2016, 178 on 05/27/2016, 150 on 07/29/2016, 152 on 10/14/2016, 145 on 12/24/2016, 1557 on 09/11/2017, 916 on  09/20/2017, 144 on 10/21/2017, and 158 on 11/16/2017.  Chest, abdomen, and pelvic CT on 09/11/2017 revealed enlarged and morphologically abnormal 4.3 x 4.9 cm left pelvic sidewall and retroperitoneal lymph nodes (measuring up to 2.9 cm).  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  There was anasarca/swelling of the left lower extremity.  There was no evidence of metastatic disease in the chest.  Head MRI on 09/13/2017 revealed no evidence of metastatic disease.   He has a left lower extremity DVT.  Bilateral lower extremity duplex on 09/10/2017 revealed evidence of obstruction in the left CFV, SFJ, and proximal femoral vein.  He is on Xarelto.  He is currently day 23 s/p cycle #2 TIP chemotherapy (09/21/2017 - 10/21/2017) with Margarette Canada support. Cycle #1 was administered at [facility]. Cycle #2 was administered at [facility].  Both cycles were complicated by lower extremity skin lesions on recovery of counts.  Audiogram on 10/27/2017 revealed hearing within normal limits.  He was diagnosed with cellulitis of the left lower extremity on 10/06/2017.  He was on clindamycin.  While hospitalized (10/08/2017 - 10/16/2017), he received 9 days of vancomycin and Cefepime.  He was discharged on Flagyl, doxycycline, and Levaquin.  He was  admitted to [facility] from 10/08/2017 - 10/16/2017 with left lower extremity cellulitis.  He was treated with Cefepime and vancomycin.  Imaging revealed significant improvement in adenopathy with decreased pelvic sidewall disease and decreased bulk at aortic bifurcation.  He was felt to have venous congestion syndrome with skin lesions due to congestion.   He was admitted to [facility] oncology floor from 10/21/2017 - 10/26/2017 for inpatient chemotherapy (cycle #2 TIP).  Was seen in consult by Dr. Tsosie Billing (infectious disease) who felt the patient had been treated adequately with antibiotic coverage and not require  follow-up further treatment for his left lower extremity cellulitis.  CBC on the day of discharge revealed a WBC 4700 ([place] 4600).  Hemoglobin 10.0, hematocrit 28.0, MCV 91.4, and platelets 371,000.    He was admitted to [facility] from 11/05/2017 - 11/09/2017 for cellulitis. He was treated with Cefepime and vancomycin.  He began oral acyclovir for herpetic labialis.  Skin biopsy revealed diffuse neutrophilic dermatitis. Cultures were negative.  He was admitted to [facility] from 11/05/2017 - 11/09/2017 for cellulitis.  He was developing similar lower extremity skin lesions following his first cycle of chemotherapy.  Xarelto was held as he was thrombocytopenic (nadir platelet count 39,000).  He was treated with heparin.  Lower extremity duplex revealed no clot.  He was treated with Cefepime and vancomycin.  He was discharged on Augmentin.  He received oral acyclovir for herpetic labialis.  He underwent IVC filterplacement, percutaneous angioplastyand stent placementof the left common iliac vein and left external iliac vein on 10/13/2017.  He was discharged on Flagyl, doxycycline, and Levaquin.  Hypercoagulable work-up on 10/12/2017 negative for the following: Factor V Leiden, prothrombin gene mutation, lupus anticoagulant, anticardiolipin antibodies, beta-2 glycoprotein, protein C activity/antigen, protein S activity/antigen. ATIII was 54% (75-120) on heparin.  Right foot MRI on 11/17/2017 revealed artifact in the plantar soft tissues over the first metatarsal head likely representing metallic foreign bodies as seen on previous radiographs.  There was an area of increased T2 signal intensity in the plantar aspect of the first metatarsal head with focal enhancement may indicate an area of focal osteomyelitis.  There was no soft tissue abscess.  He has a right lower molar abscess s/p extraction on 11/19/2017.  Symptomatically, he feels better.  He denies any acute issues.  Exam reveals left lower extremity  healing skin lesions, non-infected right foot puncture wound, and healing right lower gum s/p dental extraction.  Plan: 1.  Labs today:  CBC with diff, CMP, Mg.  2.    Recurrent testicular cancer: Begin cycle #3 TIP chemotherapy today.  Patient admitted to Room 128.  Paclitaxel 250 mg/m2 CI over 24 hours on Day 1  [place] 500 mg/m2 over 15 minutes before ifosfamide, then at 4 hours and 8 hours from the start of each ifosfamide dose on day 2-5.  Ifosfamide 1500 mg/m2 over 1 hour daily on days 2-5.  Cisplatin 25 mg/m2 over 60 minutes on days 2-5.  Hydration pre and post ifosfamide and cisplatin.  Premed for Taxol: Famotidine, Benadryl, Decadron  Daily lab check: CBC with diff, CMP, Mg, phosphorus, urinalysis.  Neuro checks daily.  Anticipate Neulasta/Udencya in outpatient department post chemotherapy  2.   Cellulitis and skin lesions -  Etiology remains unclear.  Skin biopsy revealed diffuse neutrophilic dermatitis. ANA and ANCA negative.  RF positive (unclear significance).   Phone follow-up with Dr. Nehemiah Massed, dermatologist. Phone follow-up with Dr. Delaine Lame, infectious disease. 3.   Herpetic lip lesion:  Continue acyclovir. 4.  Left lower extremity DVT- chronic  Continue Xarelto.  Follow platelet count in outpatient department (last cycle nadir 39,000). 5.   Right lower molar abscess  Healing well s/p dental extraction.  Continue amoxicillin 500 mg po q 6 hours per Dr. Eugenie Birks. 6.   Right foot pain s/p nail puncture:  Patient seen by Dr. Cleda Mccreedy in podiatry.  No clear signs of infection.  Close monitoring planned. 7.   Electrolyte wasting:  Magnesium and potassium normal.  Continue potassium chloride and magnesium oxide supplementation. 8.   Pain and toxicology:  Continue oxycodone 10 mg po q 8 hours prn.  Anticipate decrease in use s/p dental extraction and time from puncture wound.  9.   RTC on 11/29/2017 for MD assessment, labs (CBC with diff, BMP, Mg), and  Udencya.   [name], MD  11/22/2017, 9:27 AM

## 2017-11-22 NOTE — Progress Notes (Signed)
Patient continues to have pain in his left leg, right foot and states his mouth is still sore.  Tooth was extracted on Friday.

## 2017-11-22 NOTE — Telephone Encounter (Signed)
Called report to [facility] on 1-C for patient to be admitted for chemotherapy.  Patient going to room #128.

## 2017-11-22 NOTE — H&P (Signed)
[facility] NAME: Edgar [name]    MR#:  [id number]  DATE OF BIRTH:  [birth year]  DATE OF ADMISSION:  11/22/2017  PRIMARY CARE PHYSICIAN: Patient, No Pcp Per   REQUESTING/REFERRING PHYSICIAN:   CHIEF COMPLAINT: Referred from oncology clinic for chemotherapy  HISTORY OF PRESENT ILLNESS: Edgar [name]  is a 27 y.o. male with a known history of testicular cancer, GERD, DVT status post IVC filter, mechanical thrombectomy with stent placement by vascular surgery, left thigh cellulitis treated with antibiotics was referred by oncology clinic for inpatient chemotherapy.  No complaints of any chest pain, shortness of breath.  No fever and chills.  Had labs done today.  No constipation.  Good appetite.  PAST MEDICAL HISTORY:   Past Medical History:  Diagnosis Date  . Asthma    AS A CHILD-NO INHALERS  . Cancer ([place])    testicular cancer 11/2016 per pt   . DVT (deep venous thrombosis) ([place])   . GERD (gastroesophageal reflux disease)    TUMS PRN    PAST SURGICAL HISTORY:  Past Surgical History:  Procedure Laterality Date  . ANKLE FRACTURE SURGERY Right 2004  . FINE NEEDLE ASPIRATION BIOPSY N/A 11/09/2017   Procedure: FINE NEEDLE ASPIRATION BIOPSY;  Surgeon: [name], MD;  Location: [facility];  Service: Cardiovascular;  Laterality: N/A;  . ORCHIECTOMY Left 11/26/2015   Procedure: ORCHIECTOMY;  Surgeon: [name], MD;  Location: [facility];  Service: Urology;  Laterality: Left;  radical/Inguinal approach  . PERIPHERAL VASCULAR CATHETERIZATION N/A 12/16/2015   Procedure: [name] Cath Insertion;  Surgeon: [name], MD;  Location: [facility];  Service: Cardiovascular;  Laterality: N/A;  . PERIPHERAL VASCULAR THROMBECTOMY Left 10/13/2017   Procedure: PERIPHERAL VASCULAR THROMBECTOMY;  Surgeon: [name], MD;  Location: [facility];  Service: Cardiovascular;  Laterality: Left;  . WISDOM  TOOTH EXTRACTION  2017    SOCIAL HISTORY:  Social History   Tobacco Use  . Smoking status: Current Every Day Smoker    Packs/day: 0.50    Years: 8.00    Pack years: 4.00    Types: Cigarettes  . Smokeless tobacco: Former Systems developer    Types: Snuff  Substance Use Topics  . Alcohol use: No    FAMILY HISTORY:  Family History  Problem Relation Age of Onset  . Skin cancer Mother   . Breast cancer Maternal Grandmother   . Colon cancer Maternal Grandfather   . Diabetes Maternal Grandfather   . Emphysema Father   . Mental illness Sister        anxiety  . Stroke Paternal Grandmother   . Prostate cancer Neg Hx   . Kidney cancer Neg Hx   . Bladder Cancer Neg Hx     DRUG ALLERGIES: No Known Allergies  REVIEW OF SYSTEMS:   CONSTITUTIONAL: No fever, fatigue or weakness.  EYES: No blurred or double vision.  EARS, NOSE, AND THROAT: No tinnitus or ear pain.  RESPIRATORY: No cough, shortness of breath, wheezing or hemoptysis.  CARDIOVASCULAR: No chest pain, orthopnea, edema.  GASTROINTESTINAL: No nausea, vomiting, diarrhea or abdominal pain.  GENITOURINARY: No dysuria, hematuria.  ENDOCRINE: No polyuria, nocturia,  HEMATOLOGY: No anemia, easy bruising or bleeding SKIN: has left leg bandage MUSCULOSKELETAL: No joint pain or arthritis.   NEUROLOGIC: No tingling, numbness, weakness.  PSYCHIATRY: No anxiety or depression.   MEDICATIONS AT HOME:  Prior to Admission medications   Medication Sig Start  Date End Date Taking? Authorizing Provider  acetaminophen (TYLENOL) 500 MG tablet Take 500-1,000 mg by mouth every 6 (six) hours as needed for mild pain or fever.     [provider]  acyclovir (ZOVIRAX) 400 MG tablet Take 400 mg by mouth 3 (three) times daily. 11/16/17 11/25/17  [provider]  amoxicillin (AMOXIL) 500 MG tablet Take 1 tablet (500 mg total) by mouth every 6 (six) hours. 11/18/17   [name], MD  dexamethasone (DECADRON) 4 MG tablet Take 8 mg by mouth  daily. Take 8 mg by mouth once daily for three days after chemo to prevent nausea 09/26/17 09/26/18  [provider]  DULoxetine (CYMBALTA) 60 MG capsule Take 1 tablet by mouth daily. 10/05/17 10/05/18  [provider]  gabapentin (NEURONTIN) 300 MG capsule Take 1 capsule (300 mg total) by mouth at bedtime. 10/16/17   Gladstone Lighter, MD  levofloxacin (LEVAQUIN) 500 MG tablet Take 500 mg by mouth daily.    [provider]  loratadine (CLARITIN) 10 MG tablet Take 1 tablet (10 mg total) by mouth daily. 10/29/17   Karen Kitchens, NP  magnesium oxide (MAG-OX) 400 MG tablet Take 1 tablet by mouth daily. 10/04/17   [provider]  mupirocin cream (BACTROBAN) 2 % Apply topically 3 (three) times daily. Patient not taking: Reported on 11/18/2017 11/09/17   Hillary Bow, MD  nicotine (NICODERM CQ - DOSED IN MG/24 HOURS) 21 mg/24hr patch Place 21 mg onto the skin daily.  09/15/17   [provider]  ondansetron (ZOFRAN-ODT) 4 MG disintegrating tablet Take 2 tablets by mouth every 8 (eight) hours as needed for nausea.  09/26/17   [provider]  oxyCODONE (OXYCONTIN) 10 mg 12 hr tablet Take 1 tablet (10 mg total) by mouth every 12 (twelve) hours. 11/09/17   Hillary Bow, MD  Oxycodone HCl 10 MG TABS Take 1 tablet (10 mg total) by mouth every 8 (eight) hours as needed. 11/18/17   [name], MD  potassium chloride SA (K-DUR,KLOR-CON) 20 MEQ tablet Take 1 tablet (20 mEq total) by mouth 2 (two) times daily. 11/18/17   [name], MD  prochlorperazine (COMPAZINE) 10 MG tablet Take 1 tablet by mouth every 6 (six) hours as needed (nausea/vomiting).  09/26/17   [provider]  XARELTO 20 MG TABS tablet Take 1 tablet by mouth daily. 09/26/17   [provider]      PHYSICAL EXAMINATION:   VITAL SIGNS: Blood pressure (!) 125/91, pulse 81, temperature 98.8 F (37.1 C), temperature source Oral, resp. rate 20, SpO2 99 %.  GENERAL:  27  y.o.-year-old patient lying in the bed with no acute distress.  EYES: Pupils equal, round, reactive to light and accommodation. No scleral icterus. Extraocular muscles intact.  HEENT: Head atraumatic, normocephalic. Oropharynx and nasopharynx clear.  NECK:  Supple, no jugular venous distention. No thyroid enlargement, no tenderness.  LUNGS: Normal breath sounds bilaterally, no wheezing, rales,rhonchi or crepitation. No use of accessory muscles of respiration.  CARDIOVASCULAR: S1, S2 normal. No murmurs, rubs, or gallops.  ABDOMEN: Soft, nontender, nondistended. Bowel sounds present. No organomegaly or mass.  EXTREMITIES: No pedal edema, cyanosis, or clubbing.  Left thigh/leg bandage  NEUROLOGIC: Cranial nerves II through XII are intact. Muscle strength 5/5 in all extremities. Sensation intact. Gait not checked.  PSYCHIATRIC: The patient is alert and oriented x 3.  SKIN: No obvious rash, lesion, or ulcer.   LABORATORY PANEL:   CBC Recent Labs  Lab 11/16/17  2878 11/22/17 0826  WBC 5.2 4.3  HGB 9.0* 9.1*  HCT 27.5* 26.8*  PLT 490* 410  MCV 95.6 95.7  MCH 31.4 32.4  MCHC 32.9 33.9  RDW 18.9* 18.7*  LYMPHSABS 0.9* 0.8*  MONOABS 1.0 0.8  EOSABS 0.0 0.0  BASOSABS 0.1 0.1   ------------------------------------------------------------------------------------------------------------------  Chemistries  Recent Labs  Lab 11/16/17 0948 11/22/17 0826  NA 139 139  K 4.2 4.2  CL 103 107  CO2 26 27  GLUCOSE 119* 117*  BUN 14 10  CREATININE 0.67 0.70  CALCIUM 9.2 9.2  MG 2.0 2.0  AST 18 17  ALT 14 18  ALKPHOS 52 44  BILITOT 0.4 0.5   ------------------------------------------------------------------------------------------------------------------ estimated creatinine clearance is 191.2 mL/min (by C-G formula based on SCr of 0.7 mg/dL). ------------------------------------------------------------------------------------------------------------------ No results for input(s): TSH,  T4TOTAL, T3FREE, THYROIDAB in the last 72 hours.  Invalid input(s): FREET3   Coagulation profile No results for input(s): INR, PROTIME in the last 168 hours. ------------------------------------------------------------------------------------------------------------------- No results for input(s): DDIMER in the last 72 hours. -------------------------------------------------------------------------------------------------------------------  Cardiac Enzymes No results for input(s): CKMB, TROPONINI, MYOGLOBIN in the last 168 hours.  Invalid input(s): CK ------------------------------------------------------------------------------------------------------------------ Invalid input(s): POCBNP  ---------------------------------------------------------------------------------------------------------------  Urinalysis    Component Value Date/Time   COLORURINE YELLOW (A) 11/05/2017 0000   APPEARANCEUR CLEAR (A) 11/05/2017 0000   APPEARANCEUR Clear 11/20/2015 1331   LABSPEC 1.024 11/05/2017 0000   PHURINE 5.0 11/05/2017 0000   GLUCOSEU NEGATIVE 11/05/2017 0000   HGBUR SMALL (A) 11/05/2017 0000   BILIRUBINUR NEGATIVE 11/05/2017 0000   BILIRUBINUR Negative 11/20/2015 1331   KETONESUR NEGATIVE 11/05/2017 0000   PROTEINUR NEGATIVE 11/05/2017 0000   NITRITE NEGATIVE 11/05/2017 0000   LEUKOCYTESUR NEGATIVE 11/05/2017 0000   LEUKOCYTESUR Negative 11/20/2015 1331     RADIOLOGY: No results found.  EKG: Orders placed or performed in visit on 11/12/17  . EKG 12-Lead    IMPRESSION AND PLAN:  27 year old male patient with history of testicular cancer, GERD, DVT, IVC filter, thrombectomy and stent placement by vascular surgery in the previous hospitalization, history of left thigh cellulitis currently referred by oncology clinic for inpatient chemotherapy  -Testicular cancer Oncology consult Chemotherapy as recommended by oncology  -History of DVT with IVC filter Continue oral  Xarelto for anticoagulation with  -Chronic anemia Monitor hemoglobin hematocrit  -DVT prophylaxis On anticoagulation with Xarelto  -Tobacco abuse Tobacco cessation counseled to the patient for 6 minutes Nicotine patch to continue  All the records are reviewed and case discussed with ED provider. Management plans discussed with the patient, family and they are in agreement.  CODE STATUS:Full code    Code Status Orders  (From admission, onward)         Start     Ordered   11/22/17 1123  Full code  Continuous     11/22/17 1123        Code Status History    Date Active Date Inactive Code Status Order ID Comments User Context   11/05/2017 1442 11/09/2017 2046 Full Code [id number]  Gorden Harms, MD Inpatient   10/21/2017 1110 10/26/2017 1635 Full Code [id number]  Vaughan Basta, MD Inpatient   10/08/2017 1621 10/16/2017 1836 Full Code [id number]  Vaughan Basta, MD Inpatient       TOTAL TIME TAKING CARE OF THIS PATIENT: 52 minutes.    Saundra Shelling M.D on 11/22/2017 at 12:06 PM  Between 7am to 6pm - Pager - [phone]  After 6pm go to [email or web address] - password [email or web address] [facility]  [facility]  Office  [phone]  CC: Primary care physician; Patient, No Pcp Per

## 2017-11-23 ENCOUNTER — Other Ambulatory Visit: Payer: Self-pay | Admitting: Hematology and Oncology

## 2017-11-23 DIAGNOSIS — L03116 Cellulitis of left lower limb: Secondary | ICD-10-CM

## 2017-11-23 DIAGNOSIS — F17210 Nicotine dependence, cigarettes, uncomplicated: Secondary | ICD-10-CM

## 2017-11-23 DIAGNOSIS — B009 Herpesviral infection, unspecified: Secondary | ICD-10-CM

## 2017-11-23 DIAGNOSIS — Z86718 Personal history of other venous thrombosis and embolism: Secondary | ICD-10-CM

## 2017-11-23 DIAGNOSIS — Z808 Family history of malignant neoplasm of other organs or systems: Secondary | ICD-10-CM

## 2017-11-23 DIAGNOSIS — K219 Gastro-esophageal reflux disease without esophagitis: Secondary | ICD-10-CM

## 2017-11-23 DIAGNOSIS — Z7901 Long term (current) use of anticoagulants: Secondary | ICD-10-CM

## 2017-11-23 DIAGNOSIS — Z803 Family history of malignant neoplasm of breast: Secondary | ICD-10-CM

## 2017-11-23 DIAGNOSIS — Z800 Family history of malignant neoplasm of digestive organs: Secondary | ICD-10-CM

## 2017-11-23 DIAGNOSIS — C6212 Malignant neoplasm of descended left testis: Secondary | ICD-10-CM

## 2017-11-23 DIAGNOSIS — Z79899 Other long term (current) drug therapy: Secondary | ICD-10-CM

## 2017-11-23 DIAGNOSIS — M795 Residual foreign body in soft tissue: Secondary | ICD-10-CM

## 2017-11-23 DIAGNOSIS — K047 Periapical abscess without sinus: Secondary | ICD-10-CM

## 2017-11-23 LAB — COMPREHENSIVE METABOLIC PANEL
ALT: 17 U/L (ref 0–44)
AST: 21 U/L (ref 15–41)
Albumin: 3.8 g/dL (ref 3.5–5.0)
Alkaline Phosphatase: 45 U/L (ref 38–126)
Anion gap: 8 (ref 5–15)
BUN: 13 mg/dL (ref 6–20)
CO2: 27 mmol/L (ref 22–32)
Calcium: 9.3 mg/dL (ref 8.9–10.3)
Chloride: 105 mmol/L (ref 98–111)
Creatinine, Ser: 0.65 mg/dL (ref 0.61–1.24)
GFR calc Af Amer: >60 mL/min (ref >60)
GFR calc non Af Amer: >60 mL/min (ref >60)
Glucose, Bld: 163 mg/dL — ABNORMAL HIGH (ref 70–99)
Potassium: 3.7 mmol/L (ref 3.5–5.1)
Sodium: 140 mmol/L (ref 135–145)
Total Bilirubin: 0.6 mg/dL (ref 0.3–1.2)
Total Protein: 6.9 g/dL (ref 6.5–8.1)

## 2017-11-23 LAB — PHOSPHORUS: Phosphorus: 2.1 mg/dL — ABNORMAL LOW (ref 2.5–4.6)

## 2017-11-23 LAB — URINALYSIS, ROUTINE W REFLEX MICROSCOPIC
Bilirubin Urine: NEGATIVE
Glucose, UA: NEGATIVE mg/dL
Hgb urine dipstick: NEGATIVE
Ketones, ur: NEGATIVE mg/dL
Leukocytes, UA: NEGATIVE
Nitrite: NEGATIVE
Protein, ur: NEGATIVE mg/dL
Specific Gravity, Urine: 1.018 (ref 1.005–1.030)
pH: 8 (ref 5.0–8.0)

## 2017-11-23 LAB — CBC
HCT: 27.6 % — ABNORMAL LOW (ref 40.0–52.0)
HEMOGLOBIN: 9.3 g/dL — AB (ref 13.0–18.0)
MCH: 32.1 pg (ref 26.0–34.0)
MCHC: 33.8 g/dL (ref 32.0–36.0)
MCV: 95 fL (ref 80.0–100.0)
Platelets: 412 10*3/uL (ref 150–440)
RBC: 2.91 MIL/uL — AB (ref 4.40–5.90)
RDW: 18.2 % — ABNORMAL HIGH (ref 11.5–14.5)
WBC: 3.5 10*3/uL — AB (ref 3.8–10.6)

## 2017-11-23 LAB — MAGNESIUM: Magnesium: 1.8 mg/dL (ref 1.7–2.4)

## 2017-11-23 MED ORDER — PALONOSETRON HCL INJECTION 0.25 MG/5ML
0.2500 mg | Freq: Once | INTRAVENOUS | Status: AC
  Administered 2017-11-23: 18:00:00 0.25 mg via INTRAVENOUS
  Filled 2017-11-23: qty 5

## 2017-11-23 MED ORDER — SODIUM CHLORIDE 0.9 % IV SOLN
500.0000 mg/m2 | Freq: Once | INTRAVENOUS | Status: AC
  Administered 2017-11-23: 19:00:00 1250 mg via INTRAVENOUS
  Filled 2017-11-23: qty 12.5

## 2017-11-23 MED ORDER — SODIUM CHLORIDE 0.9 % IV SOLN
Freq: Once | INTRAVENOUS | Status: AC
  Administered 2017-11-23: 20:00:00 via INTRAVENOUS
  Filled 2017-11-23: qty 76

## 2017-11-23 MED ORDER — OXYCODONE HCL 5 MG PO TABS
10.0000 mg | ORAL_TABLET | Freq: Three times a day (TID) | ORAL | Status: DC | PRN
  Administered 2017-11-23 – 2017-11-26 (×8): 10 mg via ORAL
  Filled 2017-11-23 (×8): qty 2

## 2017-11-23 MED ORDER — SODIUM CHLORIDE 0.9 % IV SOLN
500.0000 mg/m2 | INTRAVENOUS | Status: AC
  Administered 2017-11-24 (×2): 1250 mg via INTRAVENOUS
  Filled 2017-11-23 (×2): qty 12.5

## 2017-11-23 MED ORDER — SODIUM CHLORIDE 0.9 % IV SOLN
Freq: Once | INTRAVENOUS | Status: AC
  Administered 2017-11-23: 19:00:00 via INTRAVENOUS
  Filled 2017-11-23: qty 5

## 2017-11-23 MED ORDER — POTASSIUM CHLORIDE 2 MEQ/ML IV SOLN
Freq: Once | INTRAVENOUS | Status: AC
  Administered 2017-11-23: 16:00:00 via INTRAVENOUS
  Filled 2017-11-23: qty 10

## 2017-11-23 MED ORDER — SODIUM CHLORIDE 0.9 % IV SOLN
25.0000 mg/m2 | Freq: Once | INTRAVENOUS | Status: AC
  Administered 2017-11-23: 64 mg via INTRAVENOUS
  Filled 2017-11-23: qty 64

## 2017-11-23 NOTE — Progress Notes (Signed)
[facility] NAME: Edgar [name]    MR#:  [id number]  DATE OF BIRTH:  Nov 12, 1990  SUBJECTIVE:  CHIEF COMPLAINT:  No chief complaint on file. no complaints REVIEW OF SYSTEMS:  Review of Systems  Constitutional: Negative for chills, fever and weight loss.  HENT: Negative for nosebleeds and sore throat.   Eyes: Negative for blurred vision.  Respiratory: Negative for cough, shortness of breath and wheezing.   Cardiovascular: Negative for chest pain, orthopnea, leg swelling and PND.  Gastrointestinal: Negative for abdominal pain, constipation, diarrhea, heartburn, nausea and vomiting.  Genitourinary: Negative for dysuria and urgency.  Musculoskeletal: Negative for back pain.  Skin: Negative for rash.  Neurological: Negative for dizziness, speech change, focal weakness and headaches.  Endo/Heme/Allergies: Does not bruise/bleed easily.  Psychiatric/Behavioral: Negative for depression.    DRUG ALLERGIES:  No Known Allergies VITALS:  Blood pressure 126/88, pulse 72, temperature 98.3 F (36.8 C), temperature source Oral, resp. rate 18, height 6\' 3"  (1.905 m), weight 121.7 kg, SpO2 99 %. PHYSICAL EXAMINATION:  Physical Exam  Constitutional: He is oriented to person, place, and time.  HENT:  Head: Normocephalic and atraumatic.  Eyes: Pupils are equal, round, and reactive to light. Conjunctivae and EOM are normal.  Neck: Normal range of motion. Neck supple. No tracheal deviation present. No thyromegaly present.  Cardiovascular: Normal rate, regular rhythm and normal heart sounds.  Pulmonary/Chest: Effort normal and breath sounds normal. No respiratory distress. He has no wheezes. He exhibits no tenderness.  Abdominal: Soft. Bowel sounds are normal. He exhibits no distension. There is no tenderness.  Musculoskeletal: Normal range of motion.  Neurological: He is alert and oriented to person, place, and time. No cranial nerve deficit.    Skin: Skin is warm and dry. No rash noted.   LABORATORY PANEL:  Male CBC Recent Labs  Lab 11/23/17 0506  WBC 3.5*  HGB 9.3*  HCT 27.6*  PLT 412   ------------------------------------------------------------------------------------------------------------------ Chemistries  Recent Labs  Lab 11/22/17 0826  NA 139  K 4.2  CL 107  CO2 27  GLUCOSE 117*  BUN 10  CREATININE 0.70  CALCIUM 9.2  MG 2.0  AST 17  ALT 18  ALKPHOS 44  BILITOT 0.5   RADIOLOGY:  No results found. ASSESSMENT AND PLAN:  27 year old male patient with history of testicular cancer, GERD, DVT, IVC filter, thrombectomy and stent placement by vascular surgery in the previous hospitalization, history of left thigh cellulitis currently referred by oncology clinic for inpatient chemotherapy  -Testicular cancer Oncology consult pending Chemotherapy as recommended by oncology  -History of DVT with IVC filter Continue oral Xarelto for anticoagulation   -Chronic anemia Monitor hemoglobin hematocrit  -DVT prophylaxis On anticoagulation with Xarelto  -Tobacco abuse Tobacco cessation counseled to the patient for 6 minutes Nicotine patch to continue     All the records are reviewed and case discussed with Care Management/Social Worker. Management plans discussed with the patient, nursing and they are in agreement.  CODE STATUS: Full Code  TOTAL TIME TAKING CARE OF THIS PATIENT: 25 minutes.   More than 50% of the time was spent in counseling/coordination of care: YES  POSSIBLE D/C IN 1-2 DAYS, DEPENDING ON CLINICAL CONDITION. & Onco eval   Max Sane M.D on 11/23/2017 at 2:48 PM  Between 7am to 6pm - Pager - [phone]  After 6pm go to [email or web address] - [facility]  Office  [phone]  CC: Primary  care physician; Patient, No Pcp Per  Note: This dictation was prepared with Dragon dictation along with smaller phrase technology. Any  transcriptional errors that result from this process are unintentional.

## 2017-11-23 NOTE — Consult Note (Signed)
[facility]  Date of admission:  11/22/2017  Inpatient day:  11/23/2017  Consulting physician:  Dr. Reatha Harps Pyreddy.  Reason for Consultation:  Chemotherapy.  Chief Complaint: Edgar [name] is a 27 y.o. male with recurrent testicular cancer who was admitted through the medical oncology clinic for cycle #3 TIP chemotherapy.  HPI:  The patient was diagnosed with stage IIB testicular cancer in 11/26/2015.  He received 3 cycles of BEP chemotherapy.  He recurred in 08/2017.  He presented with a left lower extremity DVT.  He was started on Xarelto,  He received cycle #1 TIP chemotherapy at [facility] with [place] support.  He was diagnosed with a left lower extremity cellulitis on 10/06/2017.  He failed outpatient clindamycin.  He was admitted to [facility] from  from 10/08/2017 - 10/16/2017 with cellulitis and a painful swollen left leg.  He was unable to ambulate. He developed 6 necrotic skin lesions.  He was treated with Cefepime and vancomycin. Imaging revealedsignificant improvement in adenopathy with decreased pelvic sidewall disease and decreased bulk at aortic bifurcation.   He underwent IVC filterplacement, percutaneous angioplastyand stent placementof the left common iliac vein and left external iliac vein on 10/13/2017.He was discharged on Flagyl, doxycycline, and Levaquin.   He received cycle #2 TIP chemotherapy at [facility] from 10/21/2017 - 10/26/2017.  He was admitted to [facility] from 11/05/2017 - 11/09/2017 with cellulitis.  He developed similar lower extremity skin lesions following his first cycle of chemotherapy.  Skin biopsy revealed diffuse neutrophilic dermatitis. Cultures were negative. Lower extremity duplex revealed no clot.  He was treated with Cefepime and vancomycin.  He was discharged on Augmentin.  He received oral acyclovir for herpetic labialis.  He stepped on a nail on 11/01/2017.  Right foot MRI on 11/17/2017 revealed artifact in the plantar soft tissues  over the first metatarsal head likely representing metallic foreign bodies as seen on previous radiographs.  There was an area of increased T2 signal intensity in the plantar aspect of the first metatarsal head with focal enhancement may indicate an area of focal osteomyelitis.  There was no soft tissue abscess.  He was seen by Dr. Cleda Mccreedy.  He is being followed without intervention.  He had a right lower molar abscess s/p extraction on 11/19/2017.  He is on amoxicillin.  Symptomatically, he is doing well.  He denies any nausea or vomiting.  His right foot feels better as he is not standing. Minimal dental discomfort s/p extraction.   Past Medical History:  Diagnosis Date  . Asthma    AS A CHILD-NO INHALERS  . Cancer ([place])    testicular cancer 11/2016 per pt   . DVT (deep venous thrombosis) ([place])   . GERD (gastroesophageal reflux disease)    TUMS PRN    Past Surgical History:  Procedure Laterality Date  . ANKLE FRACTURE SURGERY Right 2004  . FINE NEEDLE ASPIRATION BIOPSY N/A 11/09/2017   Procedure: FINE NEEDLE ASPIRATION BIOPSY;  Surgeon: [name], MD;  Location: [place] [facility];  Service: Cardiovascular;  Laterality: N/A;  . ORCHIECTOMY Left 11/26/2015   Procedure: ORCHIECTOMY;  Surgeon: [name], MD;  Location: [facility] OR[facility];  Service: Urology;  Laterality: Left;  radical/Inguinal approach  . PERIPHERAL VASCULAR CATHETERIZATION N/A 12/16/2015   Procedure: [name] Cath Insertion;  Surgeon: [name], MD;  Location: [place] [facility];  Service: Cardiovascular;  Laterality: N/A;  . PERIPHERAL VASCULAR THROMBECTOMY Left 10/13/2017   Procedure: PERIPHERAL VASCULAR THROMBECTOMY;  Surgeon: [name], MD;  Location: [place] [facility];  Service: Cardiovascular;  Laterality: Left;  . WISDOM TOOTH EXTRACTION  2017    Family History  Problem Relation Age of Onset  . Skin cancer Mother   . Breast cancer Maternal Grandmother   . Colon cancer Maternal Grandfather   .  Diabetes Maternal Grandfather   . Emphysema Father   . Mental illness Sister        anxiety  . Stroke Paternal Grandmother   . Prostate cancer Neg Hx   . Kidney cancer Neg Hx   . Bladder Cancer Neg Hx     Social History:  reports that he has been smoking cigarettes. He has a 4.00 pack-year smoking history. He has quit using smokeless tobacco.  His smokeless tobacco use included snuff. He reports that he has current or past drug history. Drug: Marijuana. He reports that he does not drink alcohol.  He is accompanied by his mother and nurse.  Allergies: No Known Allergies  Medications Prior to Admission  Medication Sig Dispense Refill  . acetaminophen (TYLENOL) 500 MG tablet Take 500-1,000 mg by mouth every 6 (six) hours as needed for mild pain or fever.     Marland Kitchen acyclovir (ZOVIRAX) 400 MG tablet Take 400 mg by mouth 3 (three) times daily.    Marland Kitchen amoxicillin (AMOXIL) 500 MG tablet Take 1 tablet (500 mg total) by mouth every 6 (six) hours. 40 tablet 0  . dexamethasone (DECADRON) 4 MG tablet Take 8 mg by mouth daily. Take 8 mg by mouth once daily for three days after chemo to prevent nausea    . DULoxetine (CYMBALTA) 60 MG capsule Take 1 tablet by mouth daily.    Marland Kitchen gabapentin (NEURONTIN) 300 MG capsule Take 1 capsule (300 mg total) by mouth at bedtime. 30 capsule 0  . levofloxacin (LEVAQUIN) 500 MG tablet Take 500 mg by mouth daily.    Marland Kitchen loratadine (CLARITIN) 10 MG tablet Take 1 tablet (10 mg total) by mouth daily. 90 tablet 1  . magnesium oxide (MAG-OX) 400 MG tablet Take 1 tablet by mouth daily.    . nicotine (NICODERM CQ - DOSED IN MG/24 HOURS) 21 mg/24hr patch Place 21 mg onto the skin daily.     . Oxycodone HCl 10 MG TABS Take 1 tablet (10 mg total) by mouth every 8 (eight) hours as needed. 30 tablet 0  . potassium chloride SA (K-DUR,KLOR-CON) 20 MEQ tablet Take 1 tablet (20 mEq total) by mouth 2 (two) times daily. 60 tablet 1  . XARELTO 20 MG TABS tablet Take 1 tablet by mouth daily.    .  mupirocin cream (BACTROBAN) 2 % Apply topically 3 (three) times daily. (Patient not taking: Reported on 11/18/2017) 30 g 1  . ondansetron (ZOFRAN-ODT) 4 MG disintegrating tablet Take 2 tablets by mouth every 8 (eight) hours as needed for nausea.     Marland Kitchen oxyCODONE (OXYCONTIN) 10 mg 12 hr tablet Take 1 tablet (10 mg total) by mouth every 12 (twelve) hours. 10 tablet 0  . prochlorperazine (COMPAZINE) 10 MG tablet Take 1 tablet by mouth every 6 (six) hours as needed (nausea/vomiting).       Review of Systems: GENERAL:  Feels "ok".  No fevers, sweats or weight loss. PERFORMANCE STATUS (ECOG):  1 HEENT:  No visual changes, runny nose, sore throat, mouth sores or tenderness. Lungs: No shortness of breath or cough.  No hemoptysis. Cardiac:  No chest pain, palpitations, orthopnea, or PND. GI:  No nausea, vomiting, diarrhea, constipation, melena  or hematochezia. GU:  No urgency, frequency, dysuria, or hematuria. Musculoskeletal:  No back pain.  No joint pain.  No muscle tenderness. Extremities:  Minimal right foot discomfort.  No swelling. Skin: Healing left thigh and calf lesions.  Resolving herpetic lip lesion. Neuro:  No headache, numbness or weakness, balance or coordination issues. Endocrine:  No diabetes, thyroid issues, hot flashes or night sweats. Psych:  No mood changes, depression or anxiety. Pain:  Well controlled. Review of systems:  All other systems reviewed and found to be negative.   Physical Exam:  Blood pressure 126/82, pulse 73, temperature (!) 97.5 F (36.4 C), temperature source Oral, resp. rate 17, height 6\' 3"  (1.905 m), weight 268 lb 3.2 oz (121.7 kg), SpO2 97 %.  GENERAL:  Well developed, well nourished, young man sitting comfortably in the exam room in no acute distress. MENTAL STATUS:  Alert and oriented to person, place and time. HEAD: Alopecia.  Normocephalic, atraumatic, face symmetric, no Cushingoid features. EYES:  Brown eyes.  Pupils equal round and reactive to light  and accomodation.  No conjunctivitis or scleral icterus. ENT:  Upper lip herpetic lesion drying.  Oropharynx clear without lesion.  Tongue normal. Mucous membranes moist.  RESPIRATORY:  Clear to auscultation without rales, wheezes or rhonchi. CARDIOVASCULAR:  Regular rate and rhythm without murmur, rub or gallop. ABDOMEN:  Soft, non-tender, with active bowel sounds, and no hepatosplenomegaly.  No masses. SKIN:   Well healed dry oval hyperpigmented left thigh lesions.  Rapidly healing left upper calf oval lesion. Right foot puncture wound healed. EXTREMITIES: No edema, no skin discoloration or tenderness.  No palpable cords. NEUROLOGICAL: Unremarkable.  Finger to nose and RAM normal. PSYCH:  Appropriate.    Results for orders placed or performed during the hospital encounter of 11/22/17 (from the past 48 hour(s))  CBC     Status: Abnormal   Collection Time: 11/23/17  5:06 AM  Result Value Ref Range   WBC 3.5 (L) 3.8 - 10.6 K/uL   RBC 2.91 (L) 4.40 - 5.90 MIL/uL   Hemoglobin 9.3 (L) 13.0 - 18.0 g/dL   HCT 27.6 (L) 40.0 - 52.0 %   MCV 95.0 80.0 - 100.0 fL   MCH 32.1 26.0 - 34.0 pg   MCHC 33.8 32.0 - 36.0 g/dL   RDW 18.2 (H) 11.5 - 14.5 %   Platelets 412 150 - 440 K/uL    Comment: Performed at [facility], [address] [place], [place] [address]   No results found.  Assessment:  The patient is a 27 y.o. gentleman with recurrent testicular cancer who was admitted for cycle #3 TIP chemotherapy.  He has a history of LLE DVT.  He is s/p IVC filterplacement, percutaneous angioplastyand stent placementof the left common iliac vein and left external iliac vein on 10/13/2017. He is on Xarelto.  He developed left lower cellulitis x 2 with development of necrotic skin lesions.  Skin biopsy on 11/12/2017 revealed diffuse neutrophilic dermatitis. Cultures were negative.  He stepped on a nail on 11/01/2017.  Right foot MRI on 11/17/2017 revealed artifact in the plantar soft tissues  over the first metatarsal head likely representing metallic foreign bodies.  There was an area of increased T2 signal intensity in the plantar aspect of the first metatarsal head with focal enhancement may indicate an area of focal osteomyelitis.  There was no soft tissue abscess.  He is being followed by Dr. Cleda Mccreedy, podiatrist, without intervention.  He developed a right molar abscess and is  s/p extraction on 11/19/2017.  He is on amoxicillin.  He has electrolyte wasting (magnesium and potassium) secondary to cisplatin.  He is on supplementation.  Symptomatically, he denies any nausea or vomiting.  Plan:   1.   Oncology:  Cycle #3 TIP chemotherapy.    Paclitaxel 250 mg/m2 CI over 24 hours on Day 1- completes today.  [place] 500 mg/m2 over 15 minutes before ifosfamide, then at 4 hours and 8 hours from the start of each ifosfamide dose on day 2-5.  Ifosfamide 1500 mg/m2 over 1 hour daily on days 2-5.  Cisplatin 25 mg/m2 over 60 minutes on days 2-5.  Hydration pre and post ifosfamide and cisplatin.  Premed for Taxol:  Famotidine, Benadryl, Decadron  Daily lab check:  CBC with diff, CMP, Mg, phosphorus, urinalysis.  Neuro checks daily.  Anticipate Margarette Canada in outpatient department post chemotherapy  2.   Hematology:  Left lower extremity DVT s/p stent placement.  Continue Xarelto.  CBC daily.  3.   Infectious disease:  Multiple issues.  Patient currently on amoxicillin for a dental abscess s/p extraction.  He is on acyclovir for resolving herpetic lip lesion.  No current evidence of right foot osteomyelitis.  Will discuss with Dr. Delaine Lame potential prophylactic antibiotics through nadir (when skin lesions develop).  4.   Dermatology:  Etiology of diffuse neutrophilic dermatitis unclear.  5.   Fluids, Electrolyes and Nutrition:  Follow labs as above daily.  Continue outpatient electrolyte supplementation (potassium chloride 20 meq BID and magnesium oxide 400 mg a day).  Ensure total  daily fluid 2-3 liters.  Check UA daily to assure no hemorrhagic cystitis.  6.   Disposition:  Expect discharge home after chemotherapy complete if tolerating well.  Will receive Udencya in OPD on Monday, 11/29/2017.  Thank you for allowing me to participate in Edgar [name] care.  I will follow him closely while an inpatient and after discharge into the outpatient department.   [name], MD  11/23/2017, 7:00 AM

## 2017-11-23 NOTE — Plan of Care (Signed)
  Problem: Education: Goal: Knowledge of General Education information will improve Description Including pain rating scale, medication(s)/side effects and non-pharmacologic comfort measures Outcome: Progressing   Problem: Health Behavior/Discharge Planning: Goal: Ability to manage health-related needs will improve Outcome: Progressing   Problem: Clinical Measurements: Goal: Ability to maintain clinical measurements within normal limits will improve Outcome: Progressing Goal: Will remain free from infection Outcome: Progressing Goal: Diagnostic test results will improve Outcome: Progressing Goal: Respiratory complications will improve Outcome: Progressing Goal: Cardiovascular complication will be avoided Outcome: Progressing   Problem: Activity: Goal: Risk for activity intolerance will decrease Outcome: Progressing   Problem: Nutrition: Goal: Adequate nutrition will be maintained Outcome: Progressing   Problem: Coping: Goal: Level of anxiety will decrease Outcome: Progressing   Problem: Elimination: Goal: Will not experience complications related to bowel motility Outcome: Progressing Goal: Will not experience complications related to urinary retention Outcome: Progressing   Problem: Pain Managment: Goal: General experience of comfort will improve Outcome: Progressing   Problem: Safety: Goal: Ability to remain free from injury will improve Outcome: Progressing   Problem: Skin Integrity: Goal: Risk for impaired skin integrity will decrease Outcome: Progressing   Problem: Education: Goal: Knowledge of the prescribed therapeutic regimen will improve Outcome: Progressing   Problem: Activity: Goal: Ability to perform activities at highest level will improve Outcome: Progressing   Problem: Bowel/Gastric: Goal: Occurrences of nausea will decrease Outcome: Progressing Goal: Bowel function will improve Outcome: Progressing   Problem: Nutritional: Goal:  Maintenance of adequate nutrition will improve Outcome: Progressing   Problem: Clinical Measurements: Goal: Will remain free from infection Outcome: Progressing   Problem: Skin Integrity: Goal: Status of oral mucous membranes will improve Outcome: Progressing

## 2017-11-24 LAB — CBC WITH DIFFERENTIAL/PLATELET
Basophils Absolute: 0 10*3/uL (ref 0–0.1)
Basophils Relative: 0 %
Eosinophils Absolute: 0 10*3/uL (ref 0–0.7)
Eosinophils Relative: 0 %
HCT: 28.5 % — ABNORMAL LOW (ref 40.0–52.0)
Hemoglobin: 9.6 g/dL — ABNORMAL LOW (ref 13.0–18.0)
Lymphocytes Relative: 6 %
Lymphs Abs: 0.2 10*3/uL — ABNORMAL LOW (ref 1.0–3.6)
MCH: 31.9 pg (ref 26.0–34.0)
MCHC: 33.6 g/dL (ref 32.0–36.0)
MCV: 94.8 fL (ref 80.0–100.0)
Monocytes Absolute: 0.1 10*3/uL — ABNORMAL LOW (ref 0.2–1.0)
Monocytes Relative: 3 %
Neutro Abs: 2.5 10*3/uL (ref 1.4–6.5)
Neutrophils Relative %: 91 %
Platelets: 377 10*3/uL (ref 150–440)
RBC: 3.01 MIL/uL — ABNORMAL LOW (ref 4.40–5.90)
RDW: 18.4 % — ABNORMAL HIGH (ref 11.5–14.5)
WBC: 2.7 10*3/uL — ABNORMAL LOW (ref 3.8–10.6)

## 2017-11-24 LAB — COMPREHENSIVE METABOLIC PANEL
ALT: 19 U/L (ref 0–44)
AST: 24 U/L (ref 15–41)
Albumin: 3.6 g/dL (ref 3.5–5.0)
Alkaline Phosphatase: 39 U/L (ref 38–126)
Anion gap: 5 (ref 5–15)
BUN: 16 mg/dL (ref 6–20)
CO2: 26 mmol/L (ref 22–32)
Calcium: 8.8 mg/dL — ABNORMAL LOW (ref 8.9–10.3)
Chloride: 108 mmol/L (ref 98–111)
Creatinine, Ser: 0.55 mg/dL — ABNORMAL LOW (ref 0.61–1.24)
GFR calc Af Amer: >60 mL/min (ref >60)
GFR calc non Af Amer: >60 mL/min (ref >60)
Glucose, Bld: 130 mg/dL — ABNORMAL HIGH (ref 70–99)
Potassium: 4.1 mmol/L (ref 3.5–5.1)
Sodium: 139 mmol/L (ref 135–145)
Total Bilirubin: 0.5 mg/dL (ref 0.3–1.2)
Total Protein: 6.5 g/dL (ref 6.5–8.1)

## 2017-11-24 LAB — URINALYSIS, ROUTINE W REFLEX MICROSCOPIC
Bilirubin Urine: NEGATIVE
Glucose, UA: 50 mg/dL — AB
Hgb urine dipstick: NEGATIVE
Ketones, ur: 20 mg/dL — AB
Leukocytes, UA: NEGATIVE
Nitrite: NEGATIVE
Protein, ur: NEGATIVE mg/dL
Specific Gravity, Urine: 1.012 (ref 1.005–1.030)
pH: 6 (ref 5.0–8.0)

## 2017-11-24 LAB — MAGNESIUM: Magnesium: 2.3 mg/dL (ref 1.7–2.4)

## 2017-11-24 LAB — PHOSPHORUS: Phosphorus: 3.3 mg/dL (ref 2.5–4.6)

## 2017-11-24 MED ORDER — SODIUM CHLORIDE 0.9 % IV SOLN
500.0000 mg/m2 | Freq: Once | INTRAVENOUS | Status: AC
  Administered 2017-11-24: 1250 mg via INTRAVENOUS
  Filled 2017-11-24: qty 12.5

## 2017-11-24 MED ORDER — SODIUM CHLORIDE 0.9 % IV SOLN
25.0000 mg/m2 | Freq: Once | INTRAVENOUS | Status: AC
  Administered 2017-11-24: 19:00:00 64 mg via INTRAVENOUS
  Filled 2017-11-24: qty 64

## 2017-11-24 MED ORDER — POTASSIUM CHLORIDE 2 MEQ/ML IV SOLN
Freq: Once | INTRAVENOUS | Status: AC
  Administered 2017-11-24: 14:00:00 via INTRAVENOUS
  Filled 2017-11-24: qty 10

## 2017-11-24 MED ORDER — SODIUM CHLORIDE 0.9 % IV SOLN
Freq: Once | INTRAVENOUS | Status: AC
  Administered 2017-11-24: 17:00:00 via INTRAVENOUS
  Filled 2017-11-24: qty 76

## 2017-11-24 MED ORDER — SODIUM CHLORIDE 0.9 % IV SOLN
500.0000 mg/m2 | INTRAVENOUS | Status: AC
  Administered 2017-11-24 – 2017-11-25 (×2): 1250 mg via INTRAVENOUS
  Filled 2017-11-24 (×2): qty 12.5

## 2017-11-24 MED ORDER — SODIUM CHLORIDE 0.9 % IV SOLN
20.0000 mg | Freq: Once | INTRAVENOUS | Status: AC
  Administered 2017-11-24: 20 mg via INTRAVENOUS
  Filled 2017-11-24: qty 2

## 2017-11-24 NOTE — Progress Notes (Signed)
Pt completed  Chemo for today. tol well port intact and good blood return noted.

## 2017-11-24 NOTE — Progress Notes (Signed)
[facility] Hematology/Oncology Progress Note  Date of admission: 11/22/2017  Hospital day:  11/24/2017  Chief Complaint: Edgar [name] is a 27 y.o. male with recurrent testicular cancer who was admitted through the medical oncology clinic for cycle #3 TIP chemotherapy.  Subjective:   Feels fine.  No nausea or vomiting.  Social History: The patient is alone today.  Allergies: No Known Allergies  Scheduled Medications: . acyclovir  400 mg Oral TID  . amoxicillin  500 mg Oral Q6H  . dexamethasone  8 mg Oral Daily  . DULoxetine  60 mg Oral Daily  . gabapentin  300 mg Oral QHS  . loratadine  10 mg Oral Daily  . magnesium oxide  400 mg Oral Daily  . nicotine  21 mg Transdermal Daily  . oxyCODONE  10 mg Oral Q12H  . rivaroxaban  20 mg Oral Daily  . silver sulfADIAZINE   Topical Daily  . sodium chloride flush  3 mL Intravenous Q12H    Review of Systems: GENERAL:  Feels "fine".  No fevers, sweats or weight loss. PERFORMANCE STATUS (ECOG):  1 HEENT:  No visual changes, runny nose, sore throat, mouth sores or tenderness. Lungs: No shortness of breath or cough.  No hemoptysis. Cardiac:  No chest pain, palpitations, orthopnea, or PND. GI:  No nausea, vomiting, diarrhea, constipation, melena or hematochezia. GU:  No urgency, frequency, dysuria, or hematuria. Musculoskeletal:  No back pain.  No joint pain.  No muscle tenderness. Extremities:  Right foot bothers him "now and then".  No swelling. Skin:  Left leg lesions healing well. Herpes resolved. Neuro:  No headache, numbness or weakness, balance or coordination issues. Endocrine:  No diabetes, thyroid issues, hot flashes or night sweats. Psych:  No mood changes, depression or anxiety. Pain:  Minimal pain. Review of systems:  All other systems reviewed and found to be negative.  Physical Exam: Blood pressure 136/87, pulse 76, temperature 98 F (36.7 C), temperature source Oral, resp. rate 20, height 6' 3"   (1.905 m), weight 268 lb 3.2 oz (121.7 kg), SpO2 98 %.  GENERAL:  Well developed, well nourished, young man sleeping comfortably on the medical unit in no acute distress. MENTAL STATUS:  Alert and oriented to person, place and time. HEAD:  Alopecia.  Normocephalic, atraumatic, face symmetric, no Cushingoid features. EYES:  Brown eyes.  Pupils equal round and reactive to light and accomodation.  No conjunctivitis or scleral icterus. ENT:  Oropharynx clear without lesion.  Tongue normal. Mucous membranes moist.  RESPIRATORY:  Clear to auscultation without rales, wheezes or rhonchi. CARDIOVASCULAR:  Regular rate and rhythm without murmur, rub or gallop. ABDOMEN:  Soft, non-tender, with active bowel sounds, and no hepatosplenomegaly.  No masses. SKIN:  Well healed left thigh lesions and healing left calf lesions. EXTREMITIES: Right foot unremarkable.  No edema, no skin discoloration or tenderness.  NEUROLOGICAL: Unremarkable.  Finger to nose and RAM normal. PSYCH:  Appropriate.   Results for orders placed or performed during the hospital encounter of 11/22/17 (from the past 48 hour(s))  CBC     Status: Abnormal   Collection Time: 11/23/17  5:06 AM  Result Value Ref Range   WBC 3.5 (L) 3.8 - 10.6 K/uL   RBC 2.91 (L) 4.40 - 5.90 MIL/uL   Hemoglobin 9.3 (L) 13.0 - 18.0 g/dL   HCT 27.6 (L) 40.0 - 52.0 %   MCV 95.0 80.0 - 100.0 fL   MCH 32.1 26.0 - 34.0 pg   MCHC 33.8  32.0 - 36.0 g/dL   RDW 18.2 (H) 11.5 - 14.5 %   Platelets 412 150 - 440 K/uL    Comment: Performed at [facility], [address] [place], [place] [address]  Urinalysis, Routine w reflex microscopic     Status: Abnormal   Collection Time: 11/23/17  2:13 PM  Result Value Ref Range   Color, Urine YELLOW (A) YELLOW   APPearance CLEAR (A) CLEAR   Specific Gravity, Urine 1.018 1.005 - 1.030   pH 8.0 5.0 - 8.0   Glucose, UA NEGATIVE NEGATIVE mg/dL   Hgb urine dipstick NEGATIVE NEGATIVE   Bilirubin Urine NEGATIVE  NEGATIVE   Ketones, ur NEGATIVE NEGATIVE mg/dL   Protein, ur NEGATIVE NEGATIVE mg/dL   Nitrite NEGATIVE NEGATIVE   Leukocytes, UA NEGATIVE NEGATIVE    Comment: Performed at [facility], [address] [place], [place] [address]  Comprehensive metabolic panel     Status: Abnormal   Collection Time: 11/23/17  4:03 PM  Result Value Ref Range   Sodium 140 135 - 145 mmol/L   Potassium 3.7 3.5 - 5.1 mmol/L   Chloride 105 98 - 111 mmol/L   CO2 27 22 - 32 mmol/L   Glucose, Bld 163 (H) 70 - 99 mg/dL   BUN 13 6 - 20 mg/dL   Creatinine, Ser 0.65 0.61 - 1.24 mg/dL   Calcium 9.3 8.9 - 10.3 mg/dL   Total Protein 6.9 6.5 - 8.1 g/dL   Albumin 3.8 3.5 - 5.0 g/dL   AST 21 15 - 41 U/L   ALT 17 0 - 44 U/L   Alkaline Phosphatase 45 38 - 126 U/L   Total Bilirubin 0.6 0.3 - 1.2 mg/dL   GFR calc non Af Amer >60 >60 mL/min   GFR calc Af Amer >60 >60 mL/min    Comment: (NOTE) The eGFR has been calculated using the CKD EPI equation. This calculation has not been validated in all clinical situations. eGFR's persistently <60 mL/min signify possible Chronic Kidney Disease.    Anion gap 8 5 - 15    Comment: Performed at [facility], [address] [place], [place] [address]  Magnesium     Status: None   Collection Time: 11/23/17  4:03 PM  Result Value Ref Range   Magnesium 1.8 1.7 - 2.4 mg/dL    Comment: Performed at [facility], [address] [place], [place] [address]  Phosphorus     Status: Abnormal   Collection Time: 11/23/17  4:03 PM  Result Value Ref Range   Phosphorus 2.1 (L) 2.5 - 4.6 mg/dL    Comment: Performed at [facility], [address] [place], [place] [address]  CBC with Differential     Status: Abnormal   Collection Time: 11/24/17  4:07 AM  Result Value Ref Range   WBC 2.7 (L) 3.8 - 10.6 K/uL   RBC 3.01 (L) 4.40 - 5.90 MIL/uL   Hemoglobin 9.6 (L) 13.0 - 18.0 g/dL   HCT 28.5 (L) 40.0 - 52.0 %   MCV 94.8 80.0 - 100.0 fL   MCH 31.9  26.0 - 34.0 pg   MCHC 33.6 32.0 - 36.0 g/dL   RDW 18.4 (H) 11.5 - 14.5 %   Platelets 377 150 - 440 K/uL   Neutrophils Relative % 91 %   Neutro Abs 2.5 1.4 - 6.5 K/uL   Lymphocytes Relative 6 %   Lymphs Abs 0.2 (L) 1.0 - 3.6 K/uL   Monocytes Relative  3 %   Monocytes Absolute 0.1 (L) 0.2 - 1.0 K/uL   Eosinophils Relative 0 %   Eosinophils Absolute 0.0 0 - 0.7 K/uL   Basophils Relative 0 %   Basophils Absolute 0.0 0 - 0.1 K/uL    Comment: Performed at [facility], [address] [place], [place] [address]  Comprehensive metabolic panel     Status: Abnormal   Collection Time: 11/24/17  4:07 AM  Result Value Ref Range   Sodium 139 135 - 145 mmol/L   Potassium 4.1 3.5 - 5.1 mmol/L   Chloride 108 98 - 111 mmol/L   CO2 26 22 - 32 mmol/L   Glucose, Bld 130 (H) 70 - 99 mg/dL   BUN 16 6 - 20 mg/dL   Creatinine, Ser 0.55 (L) 0.61 - 1.24 mg/dL   Calcium 8.8 (L) 8.9 - 10.3 mg/dL   Total Protein 6.5 6.5 - 8.1 g/dL   Albumin 3.6 3.5 - 5.0 g/dL   AST 24 15 - 41 U/L   ALT 19 0 - 44 U/L   Alkaline Phosphatase 39 38 - 126 U/L   Total Bilirubin 0.5 0.3 - 1.2 mg/dL   GFR calc non Af Amer >60 >60 mL/min   GFR calc Af Amer >60 >60 mL/min    Comment: (NOTE) The eGFR has been calculated using the CKD EPI equation. This calculation has not been validated in all clinical situations. eGFR's persistently <60 mL/min signify possible Chronic Kidney Disease.    Anion gap 5 5 - 15    Comment: Performed at [facility], [address] [place], [place] [address]  Magnesium     Status: None   Collection Time: 11/24/17  4:07 AM  Result Value Ref Range   Magnesium 2.3 1.7 - 2.4 mg/dL    Comment: Performed at [facility], [address] [place], [place] [address]  Phosphorus     Status: None   Collection Time: 11/24/17  4:07 AM  Result Value Ref Range   Phosphorus 3.3 2.5 - 4.6 mg/dL    Comment: Performed at [facility], [address] [place], [place]  [address]  Urinalysis, Routine w reflex microscopic     Status: Abnormal   Collection Time: 11/24/17  4:07 AM  Result Value Ref Range   Color, Urine STRAW (A) YELLOW   APPearance CLEAR (A) CLEAR   Specific Gravity, Urine 1.012 1.005 - 1.030   pH 6.0 5.0 - 8.0   Glucose, UA 50 (A) NEGATIVE mg/dL   Hgb urine dipstick NEGATIVE NEGATIVE   Bilirubin Urine NEGATIVE NEGATIVE   Ketones, ur 20 (A) NEGATIVE mg/dL   Protein, ur NEGATIVE NEGATIVE mg/dL   Nitrite NEGATIVE NEGATIVE   Leukocytes, UA NEGATIVE NEGATIVE    Comment: Performed at [facility], [address] [place], [place] [address]   No results found.  Assessment:  FREDRIC SLABACH is a 27 y.o. male with testicular cancer who was admitted for cycle #3 TIP chemotherapy.  He has a history of LLE DVT.  He is s/p IVC filterplacement, percutaneous angioplastyand stentplacementof the left common iliac vein and left external iliac vein on 10/13/2017. He is on Xarelto.  He developed left lower cellulitis x 2 with development of necrotic skin lesions.  Skin biopsyon 11/12/2017 revealed diffuseneutrophilic dermatitis. Cultures were negative.  He stepped on a nail on 11/01/2017.  Right foot MRI on 11/17/2017 revealed artifact in the plantar soft tissues over the first metatarsal head likely representing metallic  foreign bodies. There was an area of increased T2 signal intensity in the plantar aspect of the first metatarsal head with focal enhancement may indicate an area of focal osteomyelitis. There was no soft tissue abscess.  He is being followed by Dr. Cleda Mccreedy, podiatrist, without intervention.  He developed a right molar abscess and is s/p extraction on 11/19/2017.  He is on amoxicillin.  He has electrolyte wasting (magnesium and potassium) secondary to cisplatin.  He is on supplementation.  Symptomatically, he feels fine.  He denies any nausea or vomiting.  Exam is unremarkable.  Plan: 1.   Oncology:  Cycle #3 TIP  chemotherapy.    Paclitaxel 250 mg/m2 CI over 24 hours on Day 1- completed.  [place] 500 mg/m2 over 15 minutes before ifosfamide, then at 4 hours and 8 hours from the start of each ifosfamide dose on day 2-5.  Ifosfamide 1500 mg/m2 over 1 hour daily on days 2-5.  Cisplatin 25 mg/m2 over 60 minutes on days 2-5.  Hydration pre and post ifosfamide and cisplatin.  Premed for Taxol: Famotidine, Benadryl, Decadron  Daily lab check: CBC with diff, CMP, Mg, phosphorus, urinalysis.  Neuro checks daily.  Anticipate Margarette Canada in outpatient department post chemotherapy  Currently Day 3 chemotherapy today.  Tolerating well.  No nausea or vomiting.    2.   Hematology:    Left lower extremity DVT s/p stent placement.  Continue Xarelto.    Counts adequate.  3.   Infectious disease:    Patient currently on amoxicillin for a dental abscess s/p extraction.  Plan to complete a 10 day course per dentistry.  He is on acyclovir for recent herpetic lip lesion.  Switch to prophylactic acyclovir  (400 mg po BID).  No current evidence of right foot osteomyelitis.    4.   Dermatology:    Etiology of diffuse neutrophilic dermatitis unclear.  Left thigh and calf lesions healing well.  5.   Fluids, Electrolyes and Nutrition:    Electrolytes stable.    Continue outpatient electrolyte supplementation (potassium chloride 20 meq BID and magnesium oxide 400 mg a day).    Ensure total daily fluid 2-3 liters.    Urinalysis reveals no hemorrhagic cystitis.  6.   Disposition:  Expect discharge home after chemotherapy complete if tolerating well.  Will receive Udencya in [facility]PD on Monday, 11/29/2017.   [name], MD  11/24/2017, 8:21 PM

## 2017-11-24 NOTE — Progress Notes (Signed)
[facility] NAME: Edgar [name]    MR#:  [id number]  DATE OF BIRTH:  22-Apr-1990  SUBJECTIVE:  No issues overnight.  Patient seems to be tolerating chemotherapy well REVIEW OF SYSTEMS:    Review of Systems  Constitutional: Negative for fever, chills weight loss HENT: Negative for ear pain, nosebleeds, congestion, facial swelling, rhinorrhea, neck pain, neck stiffness and ear discharge.   Respiratory: Negative for cough, shortness of breath, wheezing  Cardiovascular: Negative for chest pain, palpitations and leg swelling.  Gastrointestinal: Negative for heartburn, abdominal pain, vomiting, diarrhea or consitpation Genitourinary: Negative for dysuria, urgency, frequency, hematuria Musculoskeletal: Negative for back pain or joint pain Neurological: Negative for dizziness, seizures, syncope, focal weakness,  numbness and headaches.  Hematological: Does not bruise/bleed easily.  Psychiatric/Behavioral: Negative for hallucinations, confusion, dysphoric mood    Tolerating Diet: yes      DRUG ALLERGIES:  No Known Allergies  VITALS:  Blood pressure 122/76, pulse 78, temperature 97.8 F (36.6 C), temperature source Oral, resp. rate 16, height 6\' 3"  (1.905 m), weight 121.7 kg, SpO2 100 %.  PHYSICAL EXAMINATION:  Constitutional: Appears well-developed and well-nourished. No distress. HENT: Normocephalic. Marland Kitchen Oropharynx is clear and moist.  Eyes: Conjunctivae and EOM are normal. PERRLA, no scleral icterus.  Neck: Normal ROM. Neck supple. No JVD. No tracheal deviation. CVS: RRR, S1/S2 +, no murmurs, no gallops, no carotid bruit.  Pulmonary: Effort and breath sounds normal, no stridor, rhonchi, wheezes, rales.  Abdominal: Soft. BS +,  no distension, tenderness, rebound or guarding.  Musculoskeletal: Normal range of motion. No edema and no tenderness.  Neuro: Alert. CN 2-12 grossly intact. No focal deficits. Skin: Skin is warm and dry.  No rash noted. Psychiatric: Normal mood and affect.      LABORATORY PANEL:   CBC Recent Labs  Lab 11/24/17 0407  WBC 2.7*  HGB 9.6*  HCT 28.5*  PLT 377   ------------------------------------------------------------------------------------------------------------------  Chemistries  Recent Labs  Lab 11/24/17 0407  NA 139  K 4.1  CL 108  CO2 26  GLUCOSE 130*  BUN 16  CREATININE 0.55*  CALCIUM 8.8*  MG 2.3  AST 24  ALT 19  ALKPHOS 39  BILITOT 0.5   ------------------------------------------------------------------------------------------------------------------  Cardiac Enzymes No results for input(s): TROPONINI in the last 168 hours. ------------------------------------------------------------------------------------------------------------------  RADIOLOGY:  No results found.   ASSESSMENT AND PLAN:   27 year old male with recurrent testicular cancer who is admitted for chemotherapy.  1.  Recurrent testicular cancer: Patient will continue with cycle #3 chemotherapy as recommended by oncology He will need a total of 5 days of chemotherapy in the hospital. 2.  History of DVT and IVC: Continue Xarelto  3. Tobacco dependence: Patient is encouraged to quit smoking. Counseling was provided for 4 minutes. Continue nicotine patch  Management plans discussed with the patient and he is in agreement.  CODE STATUS: Full  TOTAL TIME TAKING CARE OF THIS PATIENT: 21 minutes.     POSSIBLE D/C 2 to 3 days, DEPENDING ON CLINICAL CONDITION.   Dayne Dekay M.D on 11/24/2017 at 11:58 AM  Between 7am to 6pm - Pager - [phone] After 6pm go to [email or web address] - password [email or web address] [facility]  Office  [phone]  CC: Primary care physician; Patient, No Pcp Per  Note: This dictation was prepared with Dragon dictation along with smaller phrase technology. Any transcriptional errors that result from this process are unintentional.

## 2017-11-25 DIAGNOSIS — E876 Hypokalemia: Secondary | ICD-10-CM

## 2017-11-25 LAB — CBC WITH DIFFERENTIAL/PLATELET
Basophils Absolute: 0 10*3/uL (ref 0–0.1)
Basophils Relative: 1 %
Eosinophils Absolute: 0 10*3/uL (ref 0–0.7)
Eosinophils Relative: 0 %
HCT: 28 % — ABNORMAL LOW (ref 40.0–52.0)
Hemoglobin: 9.6 g/dL — ABNORMAL LOW (ref 13.0–18.0)
Lymphocytes Relative: 7 %
Lymphs Abs: 0.2 10*3/uL — ABNORMAL LOW (ref 1.0–3.6)
MCH: 32.1 pg (ref 26.0–34.0)
MCHC: 34.4 g/dL (ref 32.0–36.0)
MCV: 93.5 fL (ref 80.0–100.0)
Monocytes Absolute: 0.1 10*3/uL — ABNORMAL LOW (ref 0.2–1.0)
Monocytes Relative: 3 %
Neutro Abs: 3 10*3/uL (ref 1.4–6.5)
Neutrophils Relative %: 89 %
Platelets: 336 10*3/uL (ref 150–440)
RBC: 2.99 MIL/uL — ABNORMAL LOW (ref 4.40–5.90)
RDW: 18.3 % — ABNORMAL HIGH (ref 11.5–14.5)
WBC: 3.4 10*3/uL — ABNORMAL LOW (ref 3.8–10.6)

## 2017-11-25 LAB — MAGNESIUM: Magnesium: 2.2 mg/dL (ref 1.7–2.4)

## 2017-11-25 LAB — URINALYSIS, ROUTINE W REFLEX MICROSCOPIC
Bilirubin Urine: NEGATIVE
Glucose, UA: NEGATIVE mg/dL
Hgb urine dipstick: NEGATIVE
Ketones, ur: 80 mg/dL — AB
Leukocytes, UA: NEGATIVE
Nitrite: NEGATIVE
Protein, ur: NEGATIVE mg/dL
Specific Gravity, Urine: 1.018 (ref 1.005–1.030)
pH: 6 (ref 5.0–8.0)

## 2017-11-25 LAB — COMPREHENSIVE METABOLIC PANEL
ALT: 20 U/L (ref 0–44)
AST: 19 U/L (ref 15–41)
Albumin: 3.5 g/dL (ref 3.5–5.0)
Alkaline Phosphatase: 35 U/L — ABNORMAL LOW (ref 38–126)
Anion gap: 6 (ref 5–15)
BUN: 17 mg/dL (ref 6–20)
CO2: 27 mmol/L (ref 22–32)
Calcium: 9 mg/dL (ref 8.9–10.3)
Chloride: 106 mmol/L (ref 98–111)
Creatinine, Ser: 0.6 mg/dL — ABNORMAL LOW (ref 0.61–1.24)
GFR calc Af Amer: >60 mL/min (ref >60)
GFR calc non Af Amer: >60 mL/min (ref >60)
Glucose, Bld: 118 mg/dL — ABNORMAL HIGH (ref 70–99)
Potassium: 4.5 mmol/L (ref 3.5–5.1)
Sodium: 139 mmol/L (ref 135–145)
Total Bilirubin: 0.9 mg/dL (ref 0.3–1.2)
Total Protein: 6.6 g/dL (ref 6.5–8.1)

## 2017-11-25 LAB — PHOSPHORUS: Phosphorus: 4 mg/dL (ref 2.5–4.6)

## 2017-11-25 MED ORDER — SODIUM CHLORIDE 0.9 % IV SOLN
Freq: Once | INTRAVENOUS | Status: AC
  Administered 2017-11-25: 18:00:00 via INTRAVENOUS
  Filled 2017-11-25: qty 76

## 2017-11-25 MED ORDER — PALONOSETRON HCL INJECTION 0.25 MG/5ML
0.2500 mg | Freq: Once | INTRAVENOUS | Status: AC
  Administered 2017-11-25: 18:00:00 0.25 mg via INTRAVENOUS
  Filled 2017-11-25: qty 5

## 2017-11-25 MED ORDER — SODIUM CHLORIDE 0.9 % IV SOLN
20.0000 mg | Freq: Once | INTRAVENOUS | Status: AC
  Administered 2017-11-25: 18:00:00 20 mg via INTRAVENOUS
  Filled 2017-11-25: qty 2

## 2017-11-25 MED ORDER — POTASSIUM CHLORIDE 2 MEQ/ML IV SOLN
Freq: Once | INTRAVENOUS | Status: AC
  Administered 2017-11-25: 14:00:00 via INTRAVENOUS
  Filled 2017-11-25: qty 10

## 2017-11-25 MED ORDER — SODIUM CHLORIDE 0.9 % IV SOLN
25.0000 mg/m2 | Freq: Once | INTRAVENOUS | Status: AC
  Administered 2017-11-25: 64 mg via INTRAVENOUS
  Filled 2017-11-25: qty 64

## 2017-11-25 MED ORDER — SODIUM CHLORIDE 0.9 % IV SOLN
500.0000 mg/m2 | Freq: Once | INTRAVENOUS | Status: AC
  Administered 2017-11-25: 1250 mg via INTRAVENOUS
  Filled 2017-11-25: qty 12.5

## 2017-11-25 MED ORDER — SODIUM CHLORIDE 0.9 % IV SOLN
500.0000 mg/m2 | INTRAVENOUS | Status: AC
  Administered 2017-11-25 – 2017-11-26 (×2): 1250 mg via INTRAVENOUS
  Filled 2017-11-25 (×2): qty 12.5

## 2017-11-25 MED ORDER — SODIUM CHLORIDE 0.9 % IV SOLN: 1250.0000 mg | Freq: Once | INTRAVENOUS | Status: DC

## 2017-11-25 NOTE — Progress Notes (Signed)
[facility] Hematology/Oncology Progress Note  Date of admission: 11/22/2017  Hospital day:  11/25/2017  Chief Complaint: Edgar [name] is a 27 y.o. male with recurrent testicular cancer who was admitted through the medical oncology clinic for cycle #3 TIP chemotherapy.  Subjective:   He denies any nausea or vomiting.  Eating well.  Notes a little discomfort in left thigh.  Social History: The patient is accompanied by his daughter today.  Allergies: No Known Allergies  Scheduled Medications: . acyclovir  400 mg Oral TID  . amoxicillin  500 mg Oral Q6H  . CISplatin  25 mg/m2 (Treatment Plan Recorded) Intravenous Once  . dexamethasone  8 mg Oral Daily  . DULoxetine  60 mg Oral Daily  . gabapentin  300 mg Oral QHS  . loratadine  10 mg Oral Daily  . magnesium oxide  400 mg Oral Daily  . nicotine  21 mg Transdermal Daily  . oxyCODONE  10 mg Oral Q12H  . rivaroxaban  20 mg Oral Daily  . silver sulfADIAZINE   Topical Daily  . sodium chloride flush  3 mL Intravenous Q12H    Review of Systems: GENERAL:  Feels "ok".  No fevers, sweats or weight loss. PERFORMANCE STATUS (ECOG):  1 HEENT:  No visual changes, runny nose, sore throat, mouth sores or tenderness. Lungs: No shortness of breath or cough.  No hemoptysis. Cardiac:  No chest pain, palpitations, orthopnea, or PND. GI:  No nausea, vomiting, diarrhea, constipation, melena or hematochezia. GU:  No urgency, frequency, dysuria, or hematuria. Musculoskeletal:  No back pain.  No joint pain.  No muscle tenderness. Extremities:  Right foot fine.  No swelling. Skin: Left thigh lesions bothersome today. Neuro:  No headache, numbness or weakness, balance or coordination issues. Endocrine:  No diabetes, thyroid issues, hot flashes or night sweats. Psych:  No mood changes, depression or anxiety. Pain:  Minimal pain. Review of systems:  All other systems reviewed and found to be negative.   Physical Exam: Blood  pressure 118/82, pulse (!) 115, temperature 98.1 F (36.7 C), temperature source Oral, resp. rate 18, height 6' 3"  (1.905 m), weight 268 lb 3.2 oz (121.7 kg), SpO2 95 %.GENERAL:  Well developed, well nourished, gentleman sitting comfortably on the medical unit in no acute distress. MENTAL STATUS:  Alert and oriented to person, place and time. HEAD:  Alopecia.  Normocephalic, atraumatic, face symmetric, no Cushingoid features. EYES:  Brown eyes.  Pupils equal round and reactive to light and accomodation.  No conjunctivitis or scleral icterus. ENT:  Oropharynx clear without lesion.  Tongue normal. Mucous membranes moist.  RESPIRATORY:  Clear to auscultation without rales, wheezes or rhonchi. CARDIOVASCULAR:  Regular rate and rhythm without murmur, rub or gallop. ABDOMEN:  Soft, non-tender, with active bowel sounds, and no hepatosplenomegaly.  No masses. SKIN:  No change in left thigh and calf lesions.  Herpetic lip lesion, resolved. EXTREMITIES: Right foot normal.  No edema, no skin discoloration or tenderness.  No palpable cords. LYMPH NODES: No palpable cervical, supraclavicular, axillary or inguinal adenopathy  NEUROLOGICAL: Unremarkable.  Finger to nose and RAM normal. PSYCH:  Appropriate.    Results for orders placed or performed during the hospital encounter of 11/22/17 (from the past 48 hour(s))  CBC with Differential     Status: Abnormal   Collection Time: 11/24/17  4:07 AM  Result Value Ref Range   WBC 2.7 (L) 3.8 - 10.6 K/uL   RBC 3.01 (L) 4.40 - 5.90 MIL/uL  Hemoglobin 9.6 (L) 13.0 - 18.0 g/dL   HCT 28.5 (L) 40.0 - 52.0 %   MCV 94.8 80.0 - 100.0 fL   MCH 31.9 26.0 - 34.0 pg   MCHC 33.6 32.0 - 36.0 g/dL   RDW 18.4 (H) 11.5 - 14.5 %   Platelets 377 150 - 440 K/uL   Neutrophils Relative % 91 %   Neutro Abs 2.5 1.4 - 6.5 K/uL   Lymphocytes Relative 6 %   Lymphs Abs 0.2 (L) 1.0 - 3.6 K/uL   Monocytes Relative 3 %   Monocytes Absolute 0.1 (L) 0.2 - 1.0 K/uL   Eosinophils  Relative 0 %   Eosinophils Absolute 0.0 0 - 0.7 K/uL   Basophils Relative 0 %   Basophils Absolute 0.0 0 - 0.1 K/uL    Comment: Performed at [facility], [address] [place], [place] [address]  Comprehensive metabolic panel     Status: Abnormal   Collection Time: 11/24/17  4:07 AM  Result Value Ref Range   Sodium 139 135 - 145 mmol/L   Potassium 4.1 3.5 - 5.1 mmol/L   Chloride 108 98 - 111 mmol/L   CO2 26 22 - 32 mmol/L   Glucose, Bld 130 (H) 70 - 99 mg/dL   BUN 16 6 - 20 mg/dL   Creatinine, Ser 0.55 (L) 0.61 - 1.24 mg/dL   Calcium 8.8 (L) 8.9 - 10.3 mg/dL   Total Protein 6.5 6.5 - 8.1 g/dL   Albumin 3.6 3.5 - 5.0 g/dL   AST 24 15 - 41 U/L   ALT 19 0 - 44 U/L   Alkaline Phosphatase 39 38 - 126 U/L   Total Bilirubin 0.5 0.3 - 1.2 mg/dL   GFR calc non Af Amer >60 >60 mL/min   GFR calc Af Amer >60 >60 mL/min    Comment: (NOTE) The eGFR has been calculated using the CKD EPI equation. This calculation has not been validated in all clinical situations. eGFR's persistently <60 mL/min signify possible Chronic Kidney Disease.    Anion gap 5 5 - 15    Comment: Performed at [facility], [address] [place], [place] [address]  Magnesium     Status: None   Collection Time: 11/24/17  4:07 AM  Result Value Ref Range   Magnesium 2.3 1.7 - 2.4 mg/dL    Comment: Performed at [facility], [address] [place], [place] [address]  Phosphorus     Status: None   Collection Time: 11/24/17  4:07 AM  Result Value Ref Range   Phosphorus 3.3 2.5 - 4.6 mg/dL    Comment: Performed at [facility], [address] [place], [place] [address]  Urinalysis, Routine w reflex microscopic     Status: Abnormal   Collection Time: 11/24/17  4:07 AM  Result Value Ref Range   Color, Urine STRAW (A) YELLOW   APPearance CLEAR (A) CLEAR   Specific Gravity, Urine 1.012 1.005 - 1.030   pH 6.0 5.0 - 8.0   Glucose, UA 50 (A) NEGATIVE mg/dL   Hgb urine dipstick  NEGATIVE NEGATIVE   Bilirubin Urine NEGATIVE NEGATIVE   Ketones, ur 20 (A) NEGATIVE mg/dL   Protein, ur NEGATIVE NEGATIVE mg/dL   Nitrite NEGATIVE NEGATIVE   Leukocytes, UA NEGATIVE NEGATIVE    Comment: Performed at [facility], [address] [place], [place] [address]  CBC with Differential     Status: Abnormal   Collection Time: 11/25/17  5:00 AM  Result Value Ref Range   WBC 3.4 (L) 3.8 - 10.6 K/uL   RBC 2.99 (L) 4.40 - 5.90 MIL/uL   Hemoglobin 9.6 (L) 13.0 - 18.0 g/dL   HCT 28.0 (L) 40.0 - 52.0 %   MCV 93.5 80.0 - 100.0 fL   MCH 32.1 26.0 - 34.0 pg   MCHC 34.4 32.0 - 36.0 g/dL   RDW 18.3 (H) 11.5 - 14.5 %   Platelets 336 150 - 440 K/uL   Neutrophils Relative % 89 %   Neutro Abs 3.0 1.4 - 6.5 K/uL   Lymphocytes Relative 7 %   Lymphs Abs 0.2 (L) 1.0 - 3.6 K/uL   Monocytes Relative 3 %   Monocytes Absolute 0.1 (L) 0.2 - 1.0 K/uL   Eosinophils Relative 0 %   Eosinophils Absolute 0.0 0 - 0.7 K/uL   Basophils Relative 1 %   Basophils Absolute 0.0 0 - 0.1 K/uL    Comment: Performed at [facility], [address] [place], [place] [address]  Comprehensive metabolic panel     Status: Abnormal   Collection Time: 11/25/17  5:00 AM  Result Value Ref Range   Sodium 139 135 - 145 mmol/L   Potassium 4.5 3.5 - 5.1 mmol/L   Chloride 106 98 - 111 mmol/L   CO2 27 22 - 32 mmol/L   Glucose, Bld 118 (H) 70 - 99 mg/dL   BUN 17 6 - 20 mg/dL   Creatinine, Ser 0.60 (L) 0.61 - 1.24 mg/dL   Calcium 9.0 8.9 - 10.3 mg/dL   Total Protein 6.6 6.5 - 8.1 g/dL   Albumin 3.5 3.5 - 5.0 g/dL   AST 19 15 - 41 U/L   ALT 20 0 - 44 U/L   Alkaline Phosphatase 35 (L) 38 - 126 U/L   Total Bilirubin 0.9 0.3 - 1.2 mg/dL   GFR calc non Af Amer >60 >60 mL/min   GFR calc Af Amer >60 >60 mL/min    Comment: (NOTE) The eGFR has been calculated using the CKD EPI equation. This calculation has not been validated in all clinical situations. eGFR's persistently <60 mL/min signify possible  Chronic Kidney Disease.    Anion gap 6 5 - 15    Comment: Performed at [facility], [address] [place], [place] [address]  Magnesium     Status: None   Collection Time: 11/25/17  5:00 AM  Result Value Ref Range   Magnesium 2.2 1.7 - 2.4 mg/dL    Comment: Performed at [facility], [address] [place], [place] [address]  Phosphorus     Status: None   Collection Time: 11/25/17  5:00 AM  Result Value Ref Range   Phosphorus 4.0 2.5 - 4.6 mg/dL    Comment: Performed at [facility], [address] [place], [place] [address]  Urinalysis, Routine w reflex microscopic     Status: Abnormal   Collection Time: 11/25/17  7:09 PM  Result Value Ref Range   Color, Urine YELLOW (A) YELLOW   APPearance CLEAR (A) CLEAR   Specific Gravity, Urine 1.018 1.005 - 1.030   pH 6.0 5.0 - 8.0   Glucose, UA NEGATIVE NEGATIVE mg/dL   Hgb urine dipstick NEGATIVE NEGATIVE   Bilirubin Urine NEGATIVE NEGATIVE   Ketones, ur 80 (A) NEGATIVE mg/dL   Protein, ur NEGATIVE NEGATIVE mg/dL   Nitrite NEGATIVE NEGATIVE   Leukocytes, UA NEGATIVE NEGATIVE    Comment: Performed at [facility], [address],  [place], [place] [address]   No results found.  Assessment:  Edgar [name] is a 27 y.o. male with testicular cancer who was admitted for cycle #3 TIP chemotherapy.  He has a history of LLE DVT.  He is s/p IVC filterplacement, percutaneous angioplastyand stentplacementof the left common iliac vein and left external iliac vein on 10/13/2017. He is on Xarelto.  He developed left lower cellulitis x 2 with development of necrotic skin lesions.  Skin biopsyon 11/12/2017 revealed diffuseneutrophilic dermatitis. Cultures were negative.  He stepped on a nail on 11/01/2017.  Right foot MRI on 11/17/2017 revealed artifact in the plantar soft tissues over the first metatarsal head likely representing metallic foreign bodies. There was an area of increased T2 signal  intensity in the plantar aspect of the first metatarsal head with focal enhancement may indicate an area of focal osteomyelitis. There was no soft tissue abscess.  He is being followed by Dr. Cleda Mccreedy, podiatrist, without intervention.  He developed a right molar abscess and is s/p extraction on 11/19/2017.  He is on amoxicillin.  He has electrolyte wasting (magnesium and potassium) secondary to cisplatin.  He is on supplementation.  Symptomatically, he notes left thigh lesions are bothersome.  He denies any nausea or vomiting.  Exam is stable.  Plan: 1.   Oncology:  Cycle #3 TIP chemotherapy.    Paclitaxel 250 mg/m2 CI over 24 hours on Day 1- completed.  [place] 500 mg/m2 over 15 minutes before ifosfamide, then at 4 hours and 8 hours from the start of each ifosfamide dose on day 2-5.  Ifosfamide 1500 mg/m2 over 1 hour daily on days 2-5.  Cisplatin 25 mg/m2 over 60 minutes on days 2-5.  Hydration pre and post ifosfamide and cisplatin.  Premed for Taxol: Famotidine, Benadryl, Decadron  Daily lab check: CBC with diff, CMP, Mg, phosphorus, urinalysis.  Neuro checks daily.  Anticipate Margarette Canada in outpatient department post chemotherapy  Currently Day 4 chemotherapy today.  No nausea or vomiting.  Urine clear.  2.   Hematology:    Left lower extremity DVT s/p stent placement.  Continue Xarelto.    Counts good.  Chemotherapy induced anemia.  3.   Infectious disease:    Patient on amoxicillin for a dental abscess s/p extraction.  Plan to complete a 10 day course per dentistry.  He is on acyclovir for recent herpetic lip lesion, now resolved.  Switch to prophylactic acyclovir  (400 mg po BID).  No current evidence of right foot osteomyelitis.    4.   Dermatology:    Etiology of diffuse neutrophilic dermatitis unclear.  Left thigh and calf lesions healing well.  Patient notes lesions bothersome today, but no change clinically.  No new lesions.  5.   Fluids, Electrolyes and  Nutrition:    Electrolytes stable.    Continue outpatient electrolyte supplementation (potassium chloride 20 meq BID and magnesium oxide 400 mg a day).    Ensure total daily fluid 2-3 liters.    Urinalysis reveals no hemorrhagic cystitis.  6.   Disposition:    Expect discharge home after chemotherapy complete if tolerating well.    As chemotherapy will complete late tomorrow night, except discharge on Saturday, 11/27/2017.  He will receive Udencya in clinic on Monday, 11/29/2017.   [name], MD  11/25/2017, 8:19 PM

## 2017-11-25 NOTE — Progress Notes (Signed)
Advanced care plan.  Purpose of the Encounter: CODE STATUS  Parties in Attendance:Patient  Patient's Decision Capacity:Good  Subjective/Patient's story: Has recurrent testicular cancer   Objective/Medical story Needs chemotherapy Labs daily monitoring   Goals of care determination:  Advance care directives and goals of care discussed Patient wants everything done which includes cpr, intubation and ventilator if need arises.   CODE STATUS: Full code   Time spent discussing advanced care planning: 16 minutes

## 2017-11-25 NOTE — Progress Notes (Signed)
Pt consented for administration of Ifosfamide

## 2017-11-25 NOTE — Progress Notes (Addendum)
[place] at [place] NAME: Edgar [name]    MR#:  [id number]  DATE OF BIRTH:  30-May-1990  SUBJECTIVE:  No issues overnight.  Patient  tolerating chemotherapy well No nausea and vomitings No abdominal pain REVIEW OF SYSTEMS:    Review of Systems  Constitutional: Negative for fever, chills weight loss HENT: Negative for ear pain, nosebleeds, congestion, facial swelling, rhinorrhea, neck pain, neck stiffness and ear discharge.   Respiratory: Negative for cough, shortness of breath, wheezing  Cardiovascular: Negative for chest pain, palpitations and leg swelling.  Gastrointestinal: Negative for heartburn, abdominal pain, vomiting, diarrhea or consitpation Genitourinary: Negative for dysuria, urgency, frequency, hematuria Musculoskeletal: Negative for back pain or joint pain Neurological: Negative for dizziness, seizures, syncope, focal weakness,  numbness and headaches.  Hematological: Does not bruise/bleed easily.  Psychiatric/Behavioral: Negative for hallucinations, confusion, dysphoric mood    Tolerating Diet: yes      DRUG ALLERGIES:  No Known Allergies  VITALS:  Blood pressure (!) 147/91, pulse 66, temperature 97.7 F (36.5 C), temperature source Oral, resp. rate 20, height 6\' 3"  (1.905 m), weight 121.7 kg, SpO2 100 %.  PHYSICAL EXAMINATION:  Constitutional: Appears well-developed and well-nourished. No distress. HENT: Normocephalic. Marland Kitchen Oropharynx is clear and moist.  Eyes: Conjunctivae and EOM are normal. PERRLA, no scleral icterus.  Neck: Normal ROM. Neck supple. No JVD. No tracheal deviation. CVS: RRR, S1/S2 +, no murmurs, no gallops, no carotid bruit.  Pulmonary: Effort and breath sounds normal, no stridor, rhonchi, wheezes, rales.  Abdominal: Soft. BS +,  no distension, tenderness, rebound or guarding.  Musculoskeletal: Normal range of motion. No edema and no tenderness.  Neuro: Alert. CN 2-12 grossly intact. No focal  deficits. Skin: Skin is warm and dry. No rash noted. Psychiatric: Normal mood and affect.      LABORATORY PANEL:   CBC Recent Labs  Lab 11/25/17 0500  WBC 3.4*  HGB 9.6*  HCT 28.0*  PLT 336   ------------------------------------------------------------------------------------------------------------------  Chemistries  Recent Labs  Lab 11/25/17 0500  NA 139  K 4.5  CL 106  CO2 27  GLUCOSE 118*  BUN 17  CREATININE 0.60*  CALCIUM 9.0  MG 2.2  AST 19  ALT 20  ALKPHOS 35*  BILITOT 0.9   ------------------------------------------------------------------------------------------------------------------  Cardiac Enzymes No results for input(s): TROPONINI in the last 168 hours. ------------------------------------------------------------------------------------------------------------------  RADIOLOGY:  No results found.   ASSESSMENT AND PLAN:   27 year old male with recurrent testicular cancer who is admitted for chemotherapy.  1.  Recurrent testicular cancer: Patient will continue with cycle #3 chemotherapy as recommended by oncology He will need a total of 5 days of chemotherapy in the hospital.Today is day 4. On cisplatin and ifosfamide chemotherapy Appreciate oncology f/u  2.  History of DVT and IVC: Continue Xarelto  3. Tobacco dependence: Patient is encouraged to quit smoking. Counseling was provided for 4 minutes. Continue nicotine patch  Management plans discussed with the patient and he is in agreement.  CODE STATUS: Full  TOTAL TIME TAKING CARE OF THIS PATIENT: 32 minutes.     POSSIBLE D/C 2 to 3 days, DEPENDING ON CLINICAL CONDITION.   Saundra Shelling M.D on 11/25/2017 at 10:39 AM  Between 7am to 6pm - Pager - [phone] After 6pm go to [email or web address] - password [email or web address] [email or web address]  Office  [phone]  CC: Primary care physician; Patient, No Pcp Per  Note: This dictation was prepared with Dragon dictation along  with smaller phrase  technology. Any transcriptional errors that result from this process are unintentional.

## 2017-11-26 ENCOUNTER — Other Ambulatory Visit: Payer: Self-pay | Admitting: Hematology and Oncology

## 2017-11-26 ENCOUNTER — Other Ambulatory Visit: Payer: Self-pay | Admitting: Urgent Care

## 2017-11-26 DIAGNOSIS — B001 Herpesviral vesicular dermatitis: Secondary | ICD-10-CM

## 2017-11-26 LAB — CBC WITH DIFFERENTIAL/PLATELET
Basophils Absolute: 0 10*3/uL (ref 0–0.1)
Basophils Relative: 1 %
Eosinophils Absolute: 0 10*3/uL (ref 0–0.7)
Eosinophils Relative: 0 %
HCT: 28.6 % — ABNORMAL LOW (ref 40.0–52.0)
Hemoglobin: 9.9 g/dL — ABNORMAL LOW (ref 13.0–18.0)
Lymphocytes Relative: 9 %
Lymphs Abs: 0.4 10*3/uL — ABNORMAL LOW (ref 1.0–3.6)
MCH: 32.5 pg (ref 26.0–34.0)
MCHC: 34.5 g/dL (ref 32.0–36.0)
MCV: 94.3 fL (ref 80.0–100.0)
Monocytes Absolute: 0 10*3/uL — ABNORMAL LOW (ref 0.2–1.0)
Monocytes Relative: 1 %
Neutro Abs: 3.9 10*3/uL (ref 1.4–6.5)
Neutrophils Relative %: 89 %
Platelets: 301 10*3/uL (ref 150–440)
RBC: 3.03 MIL/uL — ABNORMAL LOW (ref 4.40–5.90)
RDW: 17.6 % — ABNORMAL HIGH (ref 11.5–14.5)
WBC: 4.3 10*3/uL (ref 3.8–10.6)

## 2017-11-26 LAB — COMPREHENSIVE METABOLIC PANEL
ALT: 18 U/L (ref 0–44)
AST: 16 U/L (ref 15–41)
Albumin: 3.5 g/dL (ref 3.5–5.0)
Alkaline Phosphatase: 37 U/L — ABNORMAL LOW (ref 38–126)
Anion gap: 4 — ABNORMAL LOW (ref 5–15)
BUN: 20 mg/dL (ref 6–20)
CO2: 28 mmol/L (ref 22–32)
Calcium: 9.1 mg/dL (ref 8.9–10.3)
Chloride: 104 mmol/L (ref 98–111)
Creatinine, Ser: 0.56 mg/dL — ABNORMAL LOW (ref 0.61–1.24)
GFR calc Af Amer: >60 mL/min (ref >60)
GFR calc non Af Amer: >60 mL/min (ref >60)
Glucose, Bld: 106 mg/dL — ABNORMAL HIGH (ref 70–99)
Potassium: 4.3 mmol/L (ref 3.5–5.1)
Sodium: 136 mmol/L (ref 135–145)
Total Bilirubin: 1 mg/dL (ref 0.3–1.2)
Total Protein: 6.3 g/dL — ABNORMAL LOW (ref 6.5–8.1)

## 2017-11-26 LAB — URINALYSIS, ROUTINE W REFLEX MICROSCOPIC
Bilirubin Urine: NEGATIVE
Glucose, UA: 50 mg/dL — AB
Hgb urine dipstick: NEGATIVE
Ketones, ur: 5 mg/dL — AB
Leukocytes, UA: NEGATIVE
Nitrite: NEGATIVE
Protein, ur: NEGATIVE mg/dL
Specific Gravity, Urine: 1.004 — ABNORMAL LOW (ref 1.005–1.030)
pH: 7 (ref 5.0–8.0)

## 2017-11-26 LAB — MAGNESIUM: Magnesium: 2.5 mg/dL — ABNORMAL HIGH (ref 1.7–2.4)

## 2017-11-26 LAB — PHOSPHORUS: Phosphorus: 3.7 mg/dL (ref 2.5–4.6)

## 2017-11-26 MED ORDER — POTASSIUM CHLORIDE 2 MEQ/ML IV SOLN
Freq: Once | INTRAVENOUS | Status: DC
  Filled 2017-11-26: qty 10

## 2017-11-26 MED ORDER — SODIUM CHLORIDE 0.9 % IV SOLN
Freq: Once | INTRAVENOUS | Status: AC
  Administered 2017-11-26: 20:00:00 via INTRAVENOUS
  Filled 2017-11-26: qty 76

## 2017-11-26 MED ORDER — SODIUM CHLORIDE 0.9 % IV SOLN
500.0000 mg/m2 | INTRAVENOUS | Status: AC
  Administered 2017-11-27 (×2): 1250 mg via INTRAVENOUS
  Filled 2017-11-26 (×2): qty 12.5

## 2017-11-26 MED ORDER — ACYCLOVIR 200 MG PO CAPS
400.0000 mg | ORAL_CAPSULE | Freq: Two times a day (BID) | ORAL | Status: DC
  Administered 2017-11-26: 400 mg via ORAL
  Filled 2017-11-26 (×3): qty 2

## 2017-11-26 MED ORDER — SODIUM CHLORIDE 0.9 % IV SOLN
20.0000 mg | Freq: Once | INTRAVENOUS | Status: AC
  Administered 2017-11-26: 20 mg via INTRAVENOUS
  Filled 2017-11-26: qty 2

## 2017-11-26 MED ORDER — AMOXICILLIN 500 MG PO CAPS
500.0000 mg | ORAL_CAPSULE | Freq: Three times a day (TID) | ORAL | Status: DC
  Administered 2017-11-26 – 2017-11-27 (×2): 500 mg via ORAL
  Filled 2017-11-26 (×4): qty 1

## 2017-11-26 MED ORDER — SODIUM CHLORIDE 0.9 % IV SOLN
25.0000 mg/m2 | Freq: Once | INTRAVENOUS | Status: AC
  Administered 2017-11-26: 22:00:00 64 mg via INTRAVENOUS
  Filled 2017-11-26: qty 64

## 2017-11-26 MED ORDER — DEXAMETHASONE 4 MG PO TABS: 8.0000 mg | ORAL_TABLET | Freq: Every day | ORAL | 1 refills | Status: DC

## 2017-11-26 MED ORDER — POTASSIUM CHLORIDE 2 MEQ/ML IV SOLN
Freq: Once | INTRAVENOUS | Status: AC
  Administered 2017-11-26: 16:00:00 via INTRAVENOUS
  Filled 2017-11-26: qty 10

## 2017-11-26 MED ORDER — ACYCLOVIR 400 MG PO TABS: 400.0000 mg | ORAL_TABLET | Freq: Two times a day (BID) | ORAL | 1 refills | Status: DC

## 2017-11-26 MED ORDER — AMOXICILLIN 500 MG PO TABS: 500.0000 mg | ORAL_TABLET | Freq: Three times a day (TID) | ORAL | 0 refills | Status: DC

## 2017-11-26 MED ORDER — SODIUM CHLORIDE 0.9 % IV SOLN
500.0000 mg/m2 | Freq: Once | INTRAVENOUS | Status: AC
  Administered 2017-11-26: 1250 mg via INTRAVENOUS
  Filled 2017-11-26: qty 12.5

## 2017-11-26 NOTE — Progress Notes (Addendum)
[place] at [place] NAME: Edgar [name]    MR#:  [id number]  DATE OF BIRTH:  June 28, 1990  SUBJECTIVE:  No issues overnight.  Patient  tolerating chemotherapy well No nausea and vomitings No abdominal pain Has good apetite REVIEW OF SYSTEMS:    Review of Systems  Constitutional: Negative for fever, chills weight loss HENT: Negative for ear pain, nosebleeds, congestion, facial swelling, rhinorrhea, neck pain, neck stiffness and ear discharge.   Respiratory: Negative for cough, shortness of breath, wheezing  Cardiovascular: Negative for chest pain, palpitations and leg swelling.  Gastrointestinal: Negative for heartburn, abdominal pain, vomiting, diarrhea or consitpation Genitourinary: Negative for dysuria, urgency, frequency, hematuria Musculoskeletal: Negative for back pain or joint pain Neurological: Negative for dizziness, seizures, syncope, focal weakness,  numbness and headaches.  Hematological: Does not bruise/bleed easily.  Psychiatric/Behavioral: Negative for hallucinations, confusion, dysphoric mood    Tolerating Diet: yes      DRUG ALLERGIES:  No Known Allergies  VITALS:  Blood pressure 131/81, pulse 74, temperature 97.6 F (36.4 C), temperature source Oral, resp. rate 18, height 6\' 3"  (1.905 m), weight 121.7 kg, SpO2 100 %.  PHYSICAL EXAMINATION:  Constitutional: Appears well-developed and well-nourished. No distress. HENT: Normocephalic. Marland Kitchen Oropharynx is clear and moist.  Eyes: Conjunctivae and EOM are normal. PERRLA, no scleral icterus.  Neck: Normal ROM. Neck supple. No JVD. No tracheal deviation. CVS: RRR, S1/S2 +, no murmurs, no gallops, no carotid bruit.  Pulmonary: Effort and breath sounds normal, no stridor, rhonchi, wheezes, rales.  Abdominal: Soft. BS +,  no distension, tenderness, rebound or guarding.  Musculoskeletal: Normal range of motion. No edema and no tenderness.  Neuro: Alert. CN 2-12 grossly  intact. No focal deficits. Skin: Skin is warm and dry. No rash noted. Psychiatric: Normal mood and affect.      LABORATORY PANEL:   CBC Recent Labs  Lab 11/26/17 0515  WBC 4.3  HGB 9.9*  HCT 28.6*  PLT 301   ------------------------------------------------------------------------------------------------------------------  Chemistries  Recent Labs  Lab 11/26/17 0515  NA 136  K 4.3  CL 104  CO2 28  GLUCOSE 106*  BUN 20  CREATININE 0.56*  CALCIUM 9.1  MG 2.5*  AST 16  ALT 18  ALKPHOS 37*  BILITOT 1.0   ------------------------------------------------------------------------------------------------------------------  Cardiac Enzymes No results for input(s): TROPONINI in the last 168 hours. ------------------------------------------------------------------------------------------------------------------  RADIOLOGY:  No results found.   ASSESSMENT AND PLAN:   27 year old male with recurrent testicular cancer who is admitted for chemotherapy.  1.  Recurrent testicular cancer: Patient will continue with cycle #3 chemotherapy as recommended by oncology He will need a total of 5 days of chemotherapy in the hospital.Today is day 5. On cisplatin and ifosfamide chemotherapy Appreciate oncology f/u  2.  History of DVT and IVC: Continue Xarelto  3. Tobacco dependence: Patient is encouraged to quit smoking. Counseling was provided for 5 minutes. Continue nicotine patch  Management plans discussed with the patient and he is in agreement.  CODE STATUS: Full  TOTAL TIME TAKING CARE OF THIS PATIENT: 31 minutes.     POSSIBLE D/C 2 to 3 days, DEPENDING ON CLINICAL CONDITION.   Saundra Shelling M.D on 11/26/2017 at 10:58 AM  Between 7am to 6pm - Pager - [phone] After 6pm go to [email or web address] - password [email or web address] [email or web address]  Office  [phone]  CC: Primary care physician; Patient, No Pcp Per  Note: This dictation was prepared with Dragon  dictation along with  smaller phrase technology. Any transcriptional errors that result from this process are unintentional. 

## 2017-11-26 NOTE — Progress Notes (Signed)
[facility] - Inpatient Hematology/Oncology Progress Note  Date of admission: 11/22/2017  Hospital day:  11/26/2017  Chief Complaint: Edgar [name] is a 27 y.o. male with recurrent testicular cancer who was admitted through the medical oncology clinic for cycle #3 TIP chemotherapy.  Subjective:   Patient denies any new complaints.  No nausea or vomiting.  Eating well.  Left thigh aches.  Social History: The patient is alone today.  Allergies: No Known Allergies  Scheduled Medications: . acyclovir  400 mg Oral BID  . amoxicillin  500 mg Oral Q8H  . CISplatin  25 mg/m2 (Treatment Plan Recorded) Intravenous Once  . dexamethasone  8 mg Oral Daily  . DULoxetine  60 mg Oral Daily  . gabapentin  300 mg Oral QHS  . ifosfamide/mesna (IFEX/MESNEX) CHEMO IV infusion   Intravenous Once  . loratadine  10 mg Oral Daily  . magnesium oxide  400 mg Oral Daily  . nicotine  21 mg Transdermal Daily  . oxyCODONE  10 mg Oral Q12H  . rivaroxaban  20 mg Oral Daily  . silver sulfADIAZINE   Topical Daily  . sodium chloride flush  3 mL Intravenous Q12H    Review of Systems: GENERAL:  Feels "ok".  Little tired.  No fevers, sweats or weight loss. PERFORMANCE STATUS (ECOG):  1 HEENT:  No visual changes, runny nose, sore throat, mouth sores or tenderness. Lungs: No shortness of breath or cough.  No hemoptysis. Cardiac:  No chest pain, palpitations, orthopnea, or PND. GI:  No nausea, vomiting, diarrhea, constipation, melena or hematochezia. GU:  No urgency, frequency, dysuria, or hematuria. Musculoskeletal:  Left thigh aches.  No back pain.  No joint pain.  No muscle tenderness. Extremities:  No pain or swelling. Skin:  No new skin lesions. Neuro:  No headache, numbness or weakness, balance or coordination issues. Endocrine:  No diabetes, thyroid issues, hot flashes or night sweats. Psych:  No mood changes, depression or anxiety. Pain:  No focal pain. Review of systems:  All other systems  reviewed and found to be negative.   Physical Exam: Blood pressure 134/90, pulse 63, temperature 98 F (36.7 C), temperature source Oral, resp. rate 20, height 6' 3"  (1.905 m), weight 268 lb 3.2 oz (121.7 kg), SpO2 98 %. GENERAL:  Well developed, well nourished, gentleman sitting on the medical oncology unit in no acute distress. MENTAL STATUS:  Alert and oriented to person, place and time. HEAD:  Wearing a cap.  Alopecia.  Normocephalic, atraumatic, face symmetric, no Cushingoid features. EYES:  Brown eyes.  Pupils equal round and reactive to light and accomodation.  No conjunctivitis or scleral icterus. ENT:  Oropharynx clear without lesion.  Tongue normal. Mucous membranes moist.  RESPIRATORY:  Clear to auscultation without rales, wheezes or rhonchi. CARDIOVASCULAR:  Regular rate and rhythm without murmur, rub or gallop. ABDOMEN:  Soft, non-tender, with active bowel sounds, and no hepatosplenomegaly.  No masses. SKIN:  Left thigh lesions dry/stable.  Left calf lesion healing.  Resolved right thigh nodule. EXTREMITIES: Right foot unremarkable.  No edema, no skin discoloration or tenderness.  No palpable cords. NEUROLOGICAL: Unremarkable.  Finger to nose and RAM normal. PSYCH:  Appropriate.   Results for orders placed or performed during the hospital encounter of 11/22/17 (from the past 48 hour(s))  CBC with Differential     Status: Abnormal   Collection Time: 11/25/17  5:00 AM  Result Value Ref Range   WBC 3.4 (L) 3.8 - 10.6 K/uL   RBC 2.99 (  L) 4.40 - 5.90 MIL/uL   Hemoglobin 9.6 (L) 13.0 - 18.0 g/dL   HCT 28.0 (L) 40.0 - 52.0 %   MCV 93.5 80.0 - 100.0 fL   MCH 32.1 26.0 - 34.0 pg   MCHC 34.4 32.0 - 36.0 g/dL   RDW 18.3 (H) 11.5 - 14.5 %   Platelets 336 150 - 440 K/uL   Neutrophils Relative % 89 %   Neutro Abs 3.0 1.4 - 6.5 K/uL   Lymphocytes Relative 7 %   Lymphs Abs 0.2 (L) 1.0 - 3.6 K/uL   Monocytes Relative 3 %   Monocytes Absolute 0.1 (L) 0.2 - 1.0 K/uL   Eosinophils  Relative 0 %   Eosinophils Absolute 0.0 0 - 0.7 K/uL   Basophils Relative 1 %   Basophils Absolute 0.0 0 - 0.1 K/uL    Comment: Performed at [facility], [address] [place], [place] [address]  Comprehensive metabolic panel     Status: Abnormal   Collection Time: 11/25/17  5:00 AM  Result Value Ref Range   Sodium 139 135 - 145 mmol/L   Potassium 4.5 3.5 - 5.1 mmol/L   Chloride 106 98 - 111 mmol/L   CO2 27 22 - 32 mmol/L   Glucose, Bld 118 (H) 70 - 99 mg/dL   BUN 17 6 - 20 mg/dL   Creatinine, Ser 0.60 (L) 0.61 - 1.24 mg/dL   Calcium 9.0 8.9 - 10.3 mg/dL   Total Protein 6.6 6.5 - 8.1 g/dL   Albumin 3.5 3.5 - 5.0 g/dL   AST 19 15 - 41 U/L   ALT 20 0 - 44 U/L   Alkaline Phosphatase 35 (L) 38 - 126 U/L   Total Bilirubin 0.9 0.3 - 1.2 mg/dL   GFR calc non Af Amer >60 >60 mL/min   GFR calc Af Amer >60 >60 mL/min    Comment: (NOTE) The eGFR has been calculated using the CKD EPI equation. This calculation has not been validated in all clinical situations. eGFR's persistently <60 mL/min signify possible Chronic Kidney Disease.    Anion gap 6 5 - 15    Comment: Performed at [facility], [address] [place], [place] [address]  Magnesium     Status: None   Collection Time: 11/25/17  5:00 AM  Result Value Ref Range   Magnesium 2.2 1.7 - 2.4 mg/dL    Comment: Performed at [facility], [address] [place], [place] [address]  Phosphorus     Status: None   Collection Time: 11/25/17  5:00 AM  Result Value Ref Range   Phosphorus 4.0 2.5 - 4.6 mg/dL    Comment: Performed at [facility], [address] [place], [place] [address]  Urinalysis, Routine w reflex microscopic     Status: Abnormal   Collection Time: 11/25/17  7:09 PM  Result Value Ref Range   Color, Urine YELLOW (A) YELLOW   APPearance CLEAR (A) CLEAR   Specific Gravity, Urine 1.018 1.005 - 1.030   pH 6.0 5.0 - 8.0   Glucose, UA NEGATIVE NEGATIVE mg/dL   Hgb urine dipstick  NEGATIVE NEGATIVE   Bilirubin Urine NEGATIVE NEGATIVE   Ketones, ur 80 (A) NEGATIVE mg/dL   Protein, ur NEGATIVE NEGATIVE mg/dL   Nitrite NEGATIVE NEGATIVE   Leukocytes, UA NEGATIVE NEGATIVE    Comment: Performed at [facility], [address], [place], [place] [address]  CBC with Differential     Status: Abnormal   Collection  Time: 11/26/17  5:15 AM  Result Value Ref Range   WBC 4.3 3.8 - 10.6 K/uL   RBC 3.03 (L) 4.40 - 5.90 MIL/uL   Hemoglobin 9.9 (L) 13.0 - 18.0 g/dL   HCT 28.6 (L) 40.0 - 52.0 %   MCV 94.3 80.0 - 100.0 fL   MCH 32.5 26.0 - 34.0 pg   MCHC 34.5 32.0 - 36.0 g/dL   RDW 17.6 (H) 11.5 - 14.5 %   Platelets 301 150 - 440 K/uL   Neutrophils Relative % 89 %   Neutro Abs 3.9 1.4 - 6.5 K/uL   Lymphocytes Relative 9 %   Lymphs Abs 0.4 (L) 1.0 - 3.6 K/uL   Monocytes Relative 1 %   Monocytes Absolute 0.0 (L) 0.2 - 1.0 K/uL   Eosinophils Relative 0 %   Eosinophils Absolute 0.0 0 - 0.7 K/uL   Basophils Relative 1 %   Basophils Absolute 0.0 0 - 0.1 K/uL    Comment: Performed at [facility], [address] [place], [place] [address]  Comprehensive metabolic panel     Status: Abnormal   Collection Time: 11/26/17  5:15 AM  Result Value Ref Range   Sodium 136 135 - 145 mmol/L   Potassium 4.3 3.5 - 5.1 mmol/L   Chloride 104 98 - 111 mmol/L   CO2 28 22 - 32 mmol/L   Glucose, Bld 106 (H) 70 - 99 mg/dL   BUN 20 6 - 20 mg/dL   Creatinine, Ser 0.56 (L) 0.61 - 1.24 mg/dL   Calcium 9.1 8.9 - 10.3 mg/dL   Total Protein 6.3 (L) 6.5 - 8.1 g/dL   Albumin 3.5 3.5 - 5.0 g/dL   AST 16 15 - 41 U/L   ALT 18 0 - 44 U/L   Alkaline Phosphatase 37 (L) 38 - 126 U/L   Total Bilirubin 1.0 0.3 - 1.2 mg/dL   GFR calc non Af Amer >60 >60 mL/min   GFR calc Af Amer >60 >60 mL/min    Comment: (NOTE) The eGFR has been calculated using the CKD EPI equation. This calculation has not been validated in all clinical situations. eGFR's persistently <60 mL/min signify possible  Chronic Kidney Disease.    Anion gap 4 (L) 5 - 15    Comment: Performed at [facility], [address] [place], [place] [address]  Magnesium     Status: Abnormal   Collection Time: 11/26/17  5:15 AM  Result Value Ref Range   Magnesium 2.5 (H) 1.7 - 2.4 mg/dL    Comment: Performed at [facility], [address] [place], [place] [address]  Phosphorus     Status: None   Collection Time: 11/26/17  5:15 AM  Result Value Ref Range   Phosphorus 3.7 2.5 - 4.6 mg/dL    Comment: Performed at [facility], [address] [place], [place] [address]  Urinalysis, Routine w reflex microscopic     Status: Abnormal   Collection Time: 11/26/17  5:15 AM  Result Value Ref Range   Color, Urine COLORLESS (A) YELLOW   APPearance CLEAR (A) CLEAR   Specific Gravity, Urine 1.004 (L) 1.005 - 1.030   pH 7.0 5.0 - 8.0   Glucose, UA 50 (A) NEGATIVE mg/dL   Hgb urine dipstick NEGATIVE NEGATIVE   Bilirubin Urine NEGATIVE NEGATIVE   Ketones, ur 5 (A) NEGATIVE mg/dL   Protein, ur NEGATIVE NEGATIVE mg/dL   Nitrite NEGATIVE NEGATIVE   Leukocytes, UA NEGATIVE NEGATIVE  Comment: Performed at [facility], [address] [place], [place] [address]   No results found.  Assessment:  Edgar [name] is a 27 y.o. male with testicular cancer who was admitted for cycle #3 TIP chemotherapy.  He has a history of LLE DVT.  He is s/p IVC filterplacement, percutaneous angioplastyand stentplacementof the left common iliac vein and left external iliac vein on 10/13/2017. He is on Xarelto.  He developed left lower cellulitis x 2 with development of necrotic skin lesions.  Skin biopsyon 11/12/2017 revealed diffuseneutrophilic dermatitis. Cultures were negative.  He stepped on a nail on 11/01/2017.  Right foot MRI on 11/17/2017 revealed artifact in the plantar soft tissues over the first metatarsal head likely representing metallic foreign bodies. There was an area of  increased T2 signal intensity in the plantar aspect of the first metatarsal head with focal enhancement may indicate an area of focal osteomyelitis. There was no soft tissue abscess.  He is being followed by Dr. Cleda Mccreedy, podiatrist, without intervention.  He developed a right molar abscess and is s/p extraction on 11/19/2017.  He is on amoxicillin.  He has electrolyte wasting (magnesium and potassium) secondary to cisplatin.  He is on supplementation.  Symptomatically, he notes left thigh lesions are bothersome.  He denies any nausea or vomiting.  Exam is stable.  Plan: 1.   Oncology:  Cycle #3 TIP chemotherapy.    Paclitaxel 250 mg/m2 CI over 24 hours on Day 1- completed.  Mesna 500 mg/m2 over 15 minutes before ifosfamide, then at 4 hours and 8 hours from the start of each ifosfamide dose on day 2-5.  Ifosfamide 1500 mg/m2 over 1 hour daily on days 2-5.  Cisplatin 25 mg/m2 over 60 minutes on days 2-5.  Hydration pre and post ifosfamide and cisplatin.  Premed for Taxol: Famotidine, Benadryl, Decadron  Daily lab check: CBC with diff, CMP, Mg, phosphorus, urinalysis.  Neuro checks daily.  Anticipate Margarette Canada in outpatient department post chemotherapy  Currently Day 5 chemotherapy today.  No nausea or vomiting.  Urine clear.  2.   Hematology:    Left lower extremity DVT s/p stent placement.  Continue Xarelto.    Counts good.  Chemotherapy induced anemia.  Decadron 8 mg a day x 3 days to prevent nausea after discharge (Rx already sent in).  3.   Infectious disease:    Patient on amoxicillin for a dental abscess s/p extraction.  Plan for 10 day course per dentistry.  He completed acyclovir for recent herpetic lip lesion.  Will discontinue acyclovir on discharge.  No current evidence of right foot osteomyelitis.    4.   Dermatology:    Etiology of diffuse neutrophilic dermatitis unclear.  Left thigh and calf lesions healing well.  Patient notes left thigh aches, but no  change clinically.  No new lesions.  Discussed with ID plan for consideration of amoxicillin thru nadir.    5.   Fluids, Electrolyes and Nutrition:    Electrolytes stable.  Magnesium 2.5 today.  Hold dose (discussed with nurse). Check level in AM.  Continue outpatient electrolyte supplementation.    Ensure total daily fluid 2-3 liters.    Urinalysis reveals no hemorrhagic cystitis.  6.   Disposition:    Expect discharge home tomorrow morning after chemotherapy complete tonight.    He will receive Udencya in clinic on Monday, 11/29/2017.   [name], MD  11/26/2017, 5:50 PM

## 2017-11-27 ENCOUNTER — Other Ambulatory Visit: Payer: Self-pay | Admitting: Hematology and Oncology

## 2017-11-27 LAB — URINALYSIS, ROUTINE W REFLEX MICROSCOPIC
Bilirubin Urine: NEGATIVE
Glucose, UA: 50 mg/dL — AB
Hgb urine dipstick: NEGATIVE
Ketones, ur: 20 mg/dL — AB
Leukocytes, UA: NEGATIVE
Nitrite: NEGATIVE
Protein, ur: NEGATIVE mg/dL
Specific Gravity, Urine: 1.015 (ref 1.005–1.030)
pH: 6 (ref 5.0–8.0)

## 2017-11-27 LAB — CBC WITH DIFFERENTIAL/PLATELET
Basophils Absolute: 0.1 10*3/uL (ref 0–0.1)
Basophils Relative: 3 %
Eosinophils Absolute: 0 10*3/uL (ref 0–0.7)
Eosinophils Relative: 0 %
HCT: 29.8 % — ABNORMAL LOW (ref 40.0–52.0)
Hemoglobin: 10.1 g/dL — ABNORMAL LOW (ref 13.0–18.0)
Lymphocytes Relative: 13 %
Lymphs Abs: 0.5 10*3/uL — ABNORMAL LOW (ref 1.0–3.6)
MCH: 31.8 pg (ref 26.0–34.0)
MCHC: 33.9 g/dL (ref 32.0–36.0)
MCV: 93.7 fL (ref 80.0–100.0)
Monocytes Absolute: 0 10*3/uL — ABNORMAL LOW (ref 0.2–1.0)
Monocytes Relative: 0 %
Neutro Abs: 2.9 10*3/uL (ref 1.4–6.5)
Neutrophils Relative %: 84 %
Platelets: 294 10*3/uL (ref 150–440)
RBC: 3.18 MIL/uL — ABNORMAL LOW (ref 4.40–5.90)
RDW: 17.5 % — ABNORMAL HIGH (ref 11.5–14.5)
WBC: 3.5 10*3/uL — ABNORMAL LOW (ref 3.8–10.6)

## 2017-11-27 LAB — COMPREHENSIVE METABOLIC PANEL
ALT: 20 U/L (ref 0–44)
AST: 17 U/L (ref 15–41)
Albumin: 3.7 g/dL (ref 3.5–5.0)
Alkaline Phosphatase: 38 U/L (ref 38–126)
Anion gap: 7 (ref 5–15)
BUN: 20 mg/dL (ref 6–20)
CO2: 25 mmol/L (ref 22–32)
Calcium: 9.2 mg/dL (ref 8.9–10.3)
Chloride: 103 mmol/L (ref 98–111)
Creatinine, Ser: 0.6 mg/dL — ABNORMAL LOW (ref 0.61–1.24)
GFR calc Af Amer: >60 mL/min (ref >60)
GFR calc non Af Amer: >60 mL/min (ref >60)
Glucose, Bld: 87 mg/dL (ref 70–99)
Potassium: 4.3 mmol/L (ref 3.5–5.1)
Sodium: 135 mmol/L (ref 135–145)
Total Bilirubin: 0.9 mg/dL (ref 0.3–1.2)
Total Protein: 6.6 g/dL (ref 6.5–8.1)

## 2017-11-27 LAB — MAGNESIUM: Magnesium: 2.1 mg/dL (ref 1.7–2.4)

## 2017-11-27 LAB — PHOSPHORUS: Phosphorus: 3.7 mg/dL (ref 2.5–4.6)

## 2017-11-27 MED ORDER — HEPARIN SOD (PORK) LOCK FLUSH 100 UNIT/ML IV SOLN
500.0000 [IU] | Freq: Once | INTRAVENOUS | Status: DC
  Filled 2017-11-27: qty 5

## 2017-11-27 MED ORDER — OXYCODONE HCL 10 MG PO TABS: 10.0000 mg | ORAL_TABLET | Freq: Three times a day (TID) | ORAL | 0 refills | Status: AC | PRN

## 2017-11-27 NOTE — Progress Notes (Signed)
Pt is being discharged today, discharge instructions given to the patient and he verified understanding. 1 paper prescription was given to him, all belongings packed and returned to [place]. He was rolled out in a wheelchair.

## 2017-11-27 NOTE — Discharge Summary (Signed)
[facility] [place] NAME: Edgar [name]    MR#:  [id number]  DATE OF BIRTH:  12-Dec-1990  DATE OF ADMISSION:  11/22/2017 ADMITTING PHYSICIAN: [name], MD  DATE OF DISCHARGE: 11/27/2017  PRIMARY CARE PHYSICIAN: Patient, No Pcp Per    ADMISSION DIAGNOSIS:  Testicular CA inpt Chemo  DISCHARGE DIAGNOSIS:  Active Problems:   Testicular cancer ([place])   Herpes labialis   SECONDARY DIAGNOSIS:   Past Medical History:  Diagnosis Date  . Asthma    AS A CHILD-NO INHALERS  . Cancer ([place])    testicular cancer 11/2016 per pt   . DVT (deep venous thrombosis) ([place])   . GERD (gastroesophageal reflux disease)    TUMS PRN    HOSPITAL COURSE:    27 year old male with recurrent testicular cancer who is admitted for chemotherapy.  1.  Recurrent testicular cancer: Patient will continue with cycle #3 chemotherapy as recommended by oncology He received 5 days of therapy while in the hospital per oncology recommendations.  He will follow-up with oncology on Monday.  2.  History of DVT and IVC: Continue Xarelto  3. Tobacco dependence: Patient is encouraged to quit smoking. Counseling was provided. Continue nicotine patch  DISCHARGE CONDITIONS AND DIET:   Stable for discharge on regular diet  CONSULTS OBTAINED:  Treatment Team:  [name], MD  DRUG ALLERGIES:  No Known Allergies  DISCHARGE MEDICATIONS:   Allergies as of 11/27/2017   No Known Allergies     Medication List    STOP taking these medications   acyclovir 400 MG tablet Commonly known as:  ZOVIRAX   mupirocin cream 2 % Commonly known as:  BACTROBAN     TAKE these medications   acetaminophen 500 MG tablet Commonly known as:  TYLENOL Take 500-1,000 mg by mouth every 6 (six) hours as needed for mild pain or fever.   amoxicillin 500 MG tablet Commonly known as:  AMOXIL Take 1 tablet (500 mg total) by mouth every 8 (eight) hours. What changed:  when  to take this   CYMBALTA 60 MG capsule Generic drug:  DULoxetine Take 1 tablet by mouth daily.   dexamethasone 4 MG tablet Commonly known as:  DECADRON Take 2 tablets (8 mg total) by mouth daily. Take 8 mg by mouth once daily for three days after chemo to prevent nausea   gabapentin 300 MG capsule Commonly known as:  NEURONTIN Take 1 capsule (300 mg total) by mouth at bedtime.   loratadine 10 MG tablet Commonly known as:  CLARITIN Take 1 tablet (10 mg total) by mouth daily.   magnesium oxide 400 MG tablet Commonly known as:  MAG-OX Take 1 tablet by mouth daily.   nicotine 21 mg/24hr patch Commonly known as:  NICODERM CQ - dosed in mg/24 hours Place 21 mg onto the skin daily.   Oxycodone HCl 10 MG Tabs Take 1 tablet (10 mg total) by mouth every 8 (eight) hours as needed. What changed:  Another medication with the same name was removed. Continue taking this medication, and follow the directions you see here.   potassium chloride SA 20 MEQ tablet Commonly known as:  K-DUR,KLOR-CON Take 1 tablet (20 mEq total) by mouth 2 (two) times daily.   XARELTO 20 MG Tabs tablet Generic drug:  rivaroxaban Take 1 tablet by mouth daily.         Today   CHIEF COMPLAINT:  No issues overnight   VITAL SIGNS:  Blood  pressure (!) 137/95, pulse 81, temperature 97.8 F (36.6 C), temperature source Oral, resp. rate 17, height 6\' 3"  (1.905 m), weight 121.7 kg, SpO2 100 %.   REVIEW OF SYSTEMS:  Review of Systems  Constitutional: Negative.  Negative for chills, fever and malaise/fatigue.  HENT: Negative.  Negative for ear discharge, ear pain, hearing loss, nosebleeds and sore throat.   Eyes: Negative.  Negative for blurred vision and pain.  Respiratory: Negative.  Negative for cough, hemoptysis, shortness of breath and wheezing.   Cardiovascular: Negative.  Negative for chest pain, palpitations and leg swelling.  Gastrointestinal: Negative.  Negative for abdominal pain, blood in stool,  diarrhea, nausea and vomiting.  Genitourinary: Negative.  Negative for dysuria.  Musculoskeletal: Negative.  Negative for back pain.  Skin: Negative.   Neurological: Negative for dizziness, tremors, speech change, focal weakness, seizures and headaches.  Endo/Heme/Allergies: Negative.  Does not bruise/bleed easily.  Psychiatric/Behavioral: Negative.  Negative for depression, hallucinations and suicidal ideas.     PHYSICAL EXAMINATION:  GENERAL:  27 y.o.-year-old patient lying in the bed with no acute distress.  NECK:  Supple, no jugular venous distention. No thyroid enlargement, no tenderness.  LUNGS: Normal breath sounds bilaterally, no wheezing, rales,rhonchi  No use of accessory muscles of respiration.  CARDIOVASCULAR: S1, S2 normal. No murmurs, rubs, or gallops.  ABDOMEN: Soft, non-tender, non-distended. Bowel sounds present. No organomegaly or mass.  EXTREMITIES: No pedal edema, cyanosis, or clubbing.  PSYCHIATRIC: The patient is alert and oriented x 3.  SKIN: No obvious rash, lesion, or ulcer.   DATA REVIEW:   CBC Recent Labs  Lab 11/27/17 0622  WBC 3.5*  HGB 10.1*  HCT 29.8*  PLT 294    Chemistries  Recent Labs  Lab 11/27/17 0622  NA 135  K 4.3  CL 103  CO2 25  GLUCOSE 87  BUN 20  CREATININE 0.60*  CALCIUM 9.2  MG 2.1  AST 17  ALT 20  ALKPHOS 38  BILITOT 0.9    Cardiac Enzymes No results for input(s): TROPONINI in the last 168 hours.  Microbiology Results  @MICRORSLT48 @  RADIOLOGY:  No results found.    Allergies as of 11/27/2017   No Known Allergies     Medication List    STOP taking these medications   acyclovir 400 MG tablet Commonly known as:  ZOVIRAX   mupirocin cream 2 % Commonly known as:  BACTROBAN     TAKE these medications   acetaminophen 500 MG tablet Commonly known as:  TYLENOL Take 500-1,000 mg by mouth every 6 (six) hours as needed for mild pain or fever.   amoxicillin 500 MG tablet Commonly known as:  AMOXIL Take  1 tablet (500 mg total) by mouth every 8 (eight) hours. What changed:  when to take this   CYMBALTA 60 MG capsule Generic drug:  DULoxetine Take 1 tablet by mouth daily.   dexamethasone 4 MG tablet Commonly known as:  DECADRON Take 2 tablets (8 mg total) by mouth daily. Take 8 mg by mouth once daily for three days after chemo to prevent nausea   gabapentin 300 MG capsule Commonly known as:  NEURONTIN Take 1 capsule (300 mg total) by mouth at bedtime.   loratadine 10 MG tablet Commonly known as:  CLARITIN Take 1 tablet (10 mg total) by mouth daily.   magnesium oxide 400 MG tablet Commonly known as:  MAG-OX Take 1 tablet by mouth daily.   nicotine 21 mg/24hr patch Commonly known as:  NICODERM CQ -  dosed in mg/24 hours Place 21 mg onto the skin daily.   Oxycodone HCl 10 MG Tabs Take 1 tablet (10 mg total) by mouth every 8 (eight) hours as needed. What changed:  Another medication with the same name was removed. Continue taking this medication, and follow the directions you see here.   potassium chloride SA 20 MEQ tablet Commonly known as:  K-DUR,KLOR-CON Take 1 tablet (20 mEq total) by mouth 2 (two) times daily.   XARELTO 20 MG Tabs tablet Generic drug:  rivaroxaban Take 1 tablet by mouth daily.         Management plans discussed with the patient and he is in agreement. Stable for discharge home  Patient should follow up with oncology  CODE STATUS:     Code Status Orders  (From admission, onward)         Start     Ordered   11/22/17 1123  Full code  Continuous     11/22/17 1123        Code Status History    Date Active Date Inactive Code Status Order ID Comments User Context   11/05/2017 1442 11/09/2017 2046 Full Code [id number]  Gorden Harms, MD Inpatient   10/21/2017 1110 10/26/2017 1635 Full Code [id number]  Vaughan Basta, MD Inpatient   10/08/2017 1621 10/16/2017 1836 Full Code [id number]  Vaughan Basta, MD Inpatient      TOTAL TIME  TAKING CARE OF THIS PATIENT: 38 minutes.    Note: This dictation was prepared with Dragon dictation along with smaller phrase technology. Any transcriptional errors that result from this process are unintentional.  Dayjah Selman M.D on 11/27/2017 at 8:48 AM  Between 7am to 6pm - Pager - [phone] After 6pm go to [email or web address] - password [email or web address] [facility]  Office  [phone]  CC: Primary care physician; Patient, No Pcp Per

## 2017-11-28 NOTE — Progress Notes (Signed)
[facility] day:  11/29/2017  Chief Complaint: Edgar [name] is a 27 y.o. male with recurrent testicular cancer who is seen for assessment on day 8 of cycle #3 TIP chemotherapy.  HPI: The patient was last seen in the medical oncology clinic on 11/22/2017.  At that time, patient was doing well.  He felt "better" following dental extraction (pain decreased to 4/10). LEFT lower extremity lesion improved. RIGHT foot remained "sore". Herpetic lesion to lip had nearly resolved. Exam was stable. WBC was4300 ([place] 2600). Hemoglobin 9.1. Platelets 410,000.   Patient was admitted to the inpatient oncology unit from 11/22/2017 - 11/27/2017 for cycle #3 TIP chemotherapy (days 1-5). Notes from hospital course reviewed. Patient had a seemingly uncomplicated 5 days admission for his chemotherapy administration. Labs prior to discharge on 11/27/2017 revealed a WBC of 3500 ([place] 2900). Hemoglobin 10.1, hematocrit 29.8, and platelets 294,000. BUN 20 and creatinine 0.60. Potassium normal at 4.3. UA (+) for ketonuria, but no blood to suggest hemorrhagic cystitis.  In the interim, he has felt "alright".  He notes that his leg hurts.  His right foot hurts a little more.  His left thigh is sore.  He is taking his oral amoxicillin TID.  He is off acyclovir.  He is not drinking fluids as well.  He denies any mouth sores.    Past Medical History:  Diagnosis Date  . Asthma    AS A CHILD-NO INHALERS  . Cancer ([place])    testicular cancer 11/2016 per pt   . DVT (deep venous thrombosis) ([place])   . GERD (gastroesophageal reflux disease)    TUMS PRN    Past Surgical History:  Procedure Laterality Date  . ANKLE FRACTURE SURGERY Right 2004  . FINE NEEDLE ASPIRATION BIOPSY N/A 11/09/2017   Procedure: FINE NEEDLE ASPIRATION BIOPSY;  Surgeon: [name], MD;  Location: [facility];  Service: Cardiovascular;  Laterality: N/A;  . ORCHIECTOMY Left 11/26/2015   Procedure:  ORCHIECTOMY;  Surgeon: [name], MD;  Location: [facility];  Service: Urology;  Laterality: Left;  radical/Inguinal approach  . PERIPHERAL VASCULAR CATHETERIZATION N/A 12/16/2015   Procedure: [name] Cath Insertion;  Surgeon: [name], MD;  Location: [facility];  Service: Cardiovascular;  Laterality: N/A;  . PERIPHERAL VASCULAR THROMBECTOMY Left 10/13/2017   Procedure: PERIPHERAL VASCULAR THROMBECTOMY;  Surgeon: [name], MD;  Location: [facility];  Service: Cardiovascular;  Laterality: Left;  . WISDOM TOOTH EXTRACTION  2017    Family History  Problem Relation Age of Onset  . Skin cancer Mother   . Breast cancer Maternal Grandmother   . Colon cancer Maternal Grandfather   . Diabetes Maternal Grandfather   . Emphysema Father   . Mental illness Sister        anxiety  . Stroke Paternal Grandmother   . Prostate cancer Neg Hx   . Kidney cancer Neg Hx   . Bladder Cancer Neg Hx     Social History:  reports that he has been smoking cigarettes. He has a 4.00 pack-year smoking history. He has quit using smokeless tobacco.  His smokeless tobacco use included snuff. He reports that he has current or past drug history. Drug: Marijuana. He reports that he does not drink alcohol.  He smokes 1/2 pack a day.  He smokes marijuana.  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  He has a 64 year-old-daughter named [name]. The patient's  mother's cell phone number is [phone].  The patient lives in [place]. He is accompanied by his mother today.   Allergies: No Known Allergies  Current Medications: Current Outpatient Medications  Medication Sig Dispense Refill  . acetaminophen (TYLENOL) 500 MG tablet Take 500-1,000 mg by mouth every 6 (six) hours as needed for mild pain or fever.     Marland Kitchen amoxicillin (AMOXIL) 500 MG tablet Take 1 tablet (500 mg total) by mouth every 8 (eight) hours. 30 tablet 0  . dexamethasone (DECADRON) 4 MG tablet Take 2 tablets (8 mg  total) by mouth daily. Take 8 mg by mouth once daily for three days after chemo to prevent nausea 60 tablet 1  . DULoxetine (CYMBALTA) 60 MG capsule Take 1 tablet by mouth daily.    Marland Kitchen gabapentin (NEURONTIN) 300 MG capsule Take 1 capsule (300 mg total) by mouth at bedtime. 30 capsule 0  . loratadine (CLARITIN) 10 MG tablet Take 1 tablet (10 mg total) by mouth daily. 90 tablet 1  . magnesium oxide (MAG-OX) 400 MG tablet Take 1 tablet by mouth daily.    . nicotine (NICODERM CQ - DOSED IN MG/24 HOURS) 21 mg/24hr patch Place 21 mg onto the skin daily.     . Oxycodone HCl 10 MG TABS Take 1 tablet (10 mg total) by mouth every 8 (eight) hours as needed for up to 3 days. 10 tablet 0  . potassium chloride SA (K-DUR,KLOR-CON) 20 MEQ tablet Take 1 tablet (20 mEq total) by mouth 2 (two) times daily. 60 tablet 1  . XARELTO 20 MG TABS tablet Take 1 tablet by mouth daily.     No current facility-administered medications for this visit.    Facility-Administered Medications Ordered in Other Visits  Medication Dose Route Frequency Provider Last Rate Last Dose  . heparin lock flush 100 unit/mL  500 Units Intravenous Once Corcoran, [name], MD      . sodium chloride flush (NS) 0.9 % injection 10 mL  10 mL Intravenous PRN Nolon Stalls C, MD   10 mL at 10/21/17 1008    Review of Systems  Constitutional: Positive for malaise/fatigue and weight loss (8 pounds). Negative for chills, diaphoresis and fever.       Feels alright.  HENT: Negative.  Negative for congestion, ear discharge, ear pain, hearing loss, nosebleeds, sinus pain, sore throat and tinnitus.        No dental pain.  Eyes: Negative.  Negative for blurred vision, double vision, photophobia, pain, discharge and redness.  Respiratory: Positive for shortness of breath (exertional). Negative for cough, hemoptysis and sputum production.   Cardiovascular: Positive for leg swelling (minimal). Negative for chest pain, palpitations, orthopnea and PND.   Gastrointestinal: Negative for abdominal pain, blood in stool, constipation, diarrhea, heartburn, melena, nausea and vomiting.  Genitourinary: Negative for dysuria, frequency, hematuria and urgency.       Not drinking as much fluids.  Musculoskeletal: Negative for back pain, falls, joint pain, myalgias and neck pain.       RIGHT foot pain.  Skin: Negative.  Negative for rash.  Neurological: Negative.  Negative for dizziness, tingling, tremors, sensory change, speech change, focal weakness, weakness and headaches.  Endo/Heme/Allergies: Negative.  Does not bruise/bleed easily.  Psychiatric/Behavioral: Negative for memory loss and suicidal ideas. The patient is not nervous/anxious and does not have insomnia.   All other systems reviewed and are negative.  Performance status (ECOG): 1 - 2  Vital Signs There were no vitals taken for this visit.  Physical Exam  Constitutional: He is oriented to person, place, and time and well-developed, well-nourished, and in no distress. No distress.  HENT:  Head: Normocephalic and atraumatic. Hair is abnormal (chemotherapy induced alopecia).  Mouth/Throat: Oropharynx is clear and moist. Abnormal dentition (poor dentition). No oropharyngeal exudate.  Eyes: Pupils are equal, round, and reactive to light. Conjunctivae and EOM are normal. No scleral icterus.  Brown eyes  Neck: Neck supple. No JVD present.  Cardiovascular: Normal rate, regular rhythm and normal heart sounds. Exam reveals no gallop and no friction rub.  No murmur heard. Pulmonary/Chest: Effort normal and breath sounds normal. No respiratory distress. He has no wheezes. He has no rales.  Abdominal: Soft. Bowel sounds are normal. He exhibits no distension and no mass. There is no tenderness. There is no rebound and no guarding.  Musculoskeletal: Normal range of motion. He exhibits edema (minimal right foot and left proximal thigh) and tenderness (plantar surface of right foot.  No erythema,  induration or abnormality).  Puncture wound to dorsal aspect of RIGHT foot - no signs of infection.  Lymphadenopathy:    He has no cervical adenopathy.    He has no axillary adenopathy.       Right: No inguinal and no supraclavicular adenopathy present.       Left: No inguinal and no supraclavicular adenopathy present.  Neurological: He is alert and oriented to person, place, and time. Gait normal.  Skin: Skin is warm and dry. Lesion (6 circular lesions to LEFT inner and posterior thigh - healed; Left calf lesion almost healed.) noted. No rash noted. He is not diaphoretic. No erythema.  Left thigh and left calf lesions stable.  Psychiatric: Mood, affect and judgment normal.  Nursing note and vitals reviewed.  Imaging studies: 11/28/2015:  Chest, abdomen, and pelvic CT revealed no mediastinal adenopathy or suspicious pulmonary nodules.   There were 2 prominent left periaortic retroperitoneal lymph nodes (1.7 cm and 0.9 cm) concerning for testicular nodal metastasis.  There were no abnormal lymph nodes above the renal veins.  There were postsurgical change in the left hemiscrotum and left inguinal canal.  There was a 3.3 cm soft tissue abnormality anterior to the left iliopsoas muscle which could represent a metastatic lymph node (N2 lesion).  12/17/2015:  Head MRI revealed no evidence of metastatic disease.   03/30/2016:  Chest, abdomen, and pelvic CT revealed interval resolution of left abdominal/pelvic lymphadenopathy.  There was no residual metastatic disease. 09/30/2016:  Chest, abdomen, and pelvic CT revealed no evidence of recurrent or metastatic disease.  09/10/2017:  Bilateral lower extremity duplex revealed evidence of obstruction in the left CFV, SFJ, and proximal femoral vein.  09/10/2017:  CT pelvic venogram revealed multiple retroperitoneal masses including large mass within the left iliacus concerning for metastatic disease in the setting of prior testicular cancer.  Mass in the left  iliacus caused significant narrowing of the proximal left external iliac vein. There was filling defect within the distal left external iliac vein extending into the common femoral and proximal superficial femoral veins consistent with thrombus  09/11/2017:  Chest, abdomen, and pelvic CT revealed enlarged and morphologically abnormal 4.3 x 4.9 cm left pelvic sidewall and retroperitoneal lymph nodes (measuring up to 2.9 cm) concerning for metastatic disease given history of testicular cancer. Biopsy was suggested for confirmation. Prominent left inguinal lymph nodes could be metastatic or reactive in nature.  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  There was  anasarca/swelling of the left lower extremity.  There was no evidence of metastatic disease in the chest. 09/13/2017:  Head MRI revealed no evidence of metastatic disease. 11/17/2017:  Right foot MRI revealed artifact in the plantar soft tissues over the first metatarsal head likely representing metallic foreign bodies as seen on previous radiographs.  There was an area of increased T2 signal intensity in the plantar aspect of the first metatarsal head with focal enhancement may indicate an area of focal osteomyelitis.  There was no soft tissue abscess.   No results displayed because visit has over 200 results.      Assessment:  THEDORE PICKEL is a 27 y.o. male with recurrent testicular cancer.  He presented with a left lower extremity DVT on 09/10/2017.  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  Clinical/pathologic stage is T1 N1-2 S0 M0.  He received 3 cycles of  BEP (12/30/2015 - 02/24/2016).  He received OnPro Neulasta with cycle #2 and #3.  He  declined evaluation for retroperitoneal lymph node dissection (RPLND).  He declined sperm banking.  Pre surgery labs on 11/20/2015 revealed an AFP of 411.9, beta-HCG 2,669, and LDH 522 (121-224).    Tumor markers have been followed:   AFP was 411.9 (0-8.3) on 11/20/2015, 11.2 on 12/19/2015, 6.3 on 12/23/2015, 1.3 on 01/27/2016, 1.8 on 02/17/2016, 1.9 on 03/30/2016, 1.4 on 05/27/2016, 1.0 on 07/29/2016, 1.4 on 10/14/2016, 3.3 on 12/24/2016, 1 on 09/10/2017, 1 on 09/20/2017,  2.6 on 10/21/2017, and 2.1 on 11/16/2017.    Beta-HCG was 2,669 (0-3) on 11/20/2015, 2 on 12/19/2015, 1 on 12/23/2015, < 1 on 01/27/2016, < 1 on 02/17/2016, < 1 on 03/30/2016, < 1 on 05/27/2016,  < 1 on 07/29/2016, < 1 on 10/14/2016, and 3 on 12/24/2016, < 5 on 09/11/2017,  < 5 on 09/20/2017, < 1 on 10/21/2017, and < 1 on 11/16/2017.  LDH was 522 (98-192) on 11/20/2015, 179 on 12/19/2015, 160 on 12/23/2015, 202 on 01/27/2016, 156 on 02/10/2016, 161 on 02/17/2016, 172 on 03/30/2016, 178 on 05/27/2016, 150 on 07/29/2016, 152 on 10/14/2016, 145 on 12/24/2016, 1557 on 09/11/2017, 916 on 09/20/2017, 144 on 10/21/2017, and 158 on 11/16/2017.  Chest, abdomen, and pelvic CT on 09/11/2017 revealed enlarged and morphologically abnormal 4.3 x 4.9 cm left pelvic sidewall and retroperitoneal lymph nodes (measuring up to 2.9 cm).  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  There was anasarca/swelling of the left lower extremity.  There was no evidence of metastatic disease in the chest.  Head MRI on 09/13/2017 revealed no evidence of metastatic disease.   He has a left lower extremity DVT.  Bilateral lower extremity duplex on 09/10/2017 revealed evidence of obstruction in the left CFV, SFJ, and proximal femoral vein.  He is on Xarelto.  He is currently day 8 s/p cycle #3 TIP chemotherapy (09/21/2017 - 11/27/2017) with Margarette Canada support. Cycle #1 was administered at [facility]. Cycles  #2 and 3 were administered at [facility].  Both cycles 1 and 2 were complicated by lower extremity skin lesions on recovery of counts.  Audiogram on 10/27/2017 revealed hearing within normal limits.  He was diagnosed with cellulitis of the left lower extremity on 10/06/2017.  He was on clindamycin.  While hospitalized (10/08/2017 - 10/16/2017), he received 9  days of vancomycin and Cefepime.  He was discharged on Flagyl, doxycycline, and Levaquin.  He was admitted to [facility] from 10/08/2017 - 10/16/2017 with left lower extremity cellulitis.  He was treated with Cefepime and vancomycin.  Imaging revealed significant improvement in adenopathy with decreased pelvic sidewall disease and decreased bulk at aortic bifurcation.  He was felt to have venous congestion syndrome with skin lesions due to congestion.   He was admitted to [facility] oncology floor from 10/21/2017 - 10/26/2017 for inpatient chemotherapy (cycle #2 TIP).  Was seen in consult by Dr. Tsosie Billing (infectious disease) who felt the patient had been treated adequately with antibiotic coverage and not require follow-up further treatment for his left lower extremity cellulitis.  CBC on the day of discharge revealed a WBC 4700 ([place] 4600).  Hemoglobin 10.0, hematocrit 28.0, MCV 91.4, and platelets 371,000.    He was admitted to [facility] from 11/05/2017 - 11/09/2017 for cellulitis. He was treated with Cefepime and vancomycin.  He began oral acyclovir for herpetic labialis.  Skin biopsy revealed diffuse neutrophilic dermatitis. Cultures were negative.  He was admitted to [facility] from 11/05/2017 - 11/09/2017 for cellulitis.  He was developing similar lower extremity skin lesions following his first cycle of chemotherapy.  Xarelto was held as he was thrombocytopenic (nadir platelet count 39,000).  He was treated with heparin.  Lower extremity duplex revealed no clot.  He was treated with Cefepime and vancomycin.  He was discharged on Augmentin.  He received oral  acyclovir for herpetic labialis.  He underwent IVC filterplacement, percutaneous angioplastyand stent placementof the left common iliac vein and left external iliac vein on 10/13/2017.  He was discharged on Flagyl, doxycycline, and Levaquin.  Hypercoagulable work-up on 10/12/2017 negative for the following: Factor V Leiden, prothrombin gene mutation, lupus anticoagulant, anticardiolipin antibodies, beta-2 glycoprotein, protein C activity/antigen, protein S activity/antigen. ATIII was 54% (75-120) on heparin.  Right foot MRI on 11/17/2017 revealed artifact in the plantar soft tissues over the first metatarsal head likely representing metallic foreign bodies as seen on previous radiographs.  There was an area of increased T2 signal intensity in the plantar aspect of the first metatarsal head with focal enhancement may indicate an area of focal osteomyelitis.  There was no soft tissue abscess.  He has a right lower molar abscess s/p extraction on 11/19/2017.  Symptomatically, he is doing well.  He is fatigued.  His right foot is sore.  Skin is quiescent.  Weight is down.  He has been drinking less fluids.  Plan: 1. Labs today: CBC with diff, BMP, Mg 2. Recurrent testicular cancer  Doing well. Tolerated cycle #3 TIP well thus far.   Labs reviewed. WBC 1800 ([place] 1000). Will receive Udencya today as planned.  Discuss neutropenic precautions.  Discuss symptom management.  Patient has antiemetics to use on a PRN basis. Medications effective in managing symptoms. Continue as previously prescribed.  3. Chemotherapy induced anemia  Hemoglobin 9.6 and hematocrit 28.2.  Monitor.  May need transfusion at end of week. 4. Cellulitis and skin lesions  Skin biopsy revealed diffuse neutrophilic dermatitis.  Patient on prophylactic amoxicillin.  Discuss contacting clinic if any new skin lesions or diarrhea. 5. Dental abscess  Healed well.  6. Herpetic lip lesion  Resolved.   Patient to  contact clinic if recurrence of herpetic lesion.  If recurrence, will begin prophylactic acyclovir 400 mg BID.  7. LEFT lower extremity DVT  Stable on rivaroxaban. Continue as previously prescribed.   Discuss anticipated drop in platelet  count this week.  Will need to hold Xarelto if drops below 50,000.  Patient has an IVC filter in place.  Plan to remove after chemotherapy complete. 8. RIGHT foot wound (nail puncture)  Foot is tender today without any obvious erythema or swelling.  Follow-up with podiatry (Dr Cleda Mccreedy) this week.   Continue close monitoring.  9. Electrolyte wasting  Magnesium and potassium are normal.   Continue oral KCl and Mag-ox as previously prescribed.  10. Pain and symptom management  Patient has pain medications at home to use on a PRN basis. Patient  advising that the  prescribed interventions are adequate at this point. Continue all medications as previously prescribed.  11. RTC on 12/02/2017 for MD assessment and labs (CBC with diff, BMP, Mg, hold tube), and +/- transfusion.    Honor Loh, NP  11/29/2017, 4:37 PM  I saw and evaluated the patient, participating in the key portions of the service and reviewing pertinent diagnostic studies and records.  I reviewed the nurse practitioner's note and agree with the findings and the plan.  The assessment and plan were discussed with the patient.  Multiple questions were asked by the patient and answered.   Nolon Stalls, MD 11/29/2017, 4:37 PM

## 2017-11-29 ENCOUNTER — Inpatient Hospital Stay: Payer: BLUE CROSS/BLUE SHIELD

## 2017-11-29 ENCOUNTER — Inpatient Hospital Stay (HOSPITAL_BASED_OUTPATIENT_CLINIC_OR_DEPARTMENT_OTHER): Payer: BLUE CROSS/BLUE SHIELD | Admitting: Hematology and Oncology

## 2017-11-29 ENCOUNTER — Other Ambulatory Visit: Payer: Self-pay

## 2017-11-29 VITALS — BP 130/93 | HR 98 | Temp 98.0°F | Resp 18 | Wt 255.3 lb

## 2017-11-29 DIAGNOSIS — Z79899 Other long term (current) drug therapy: Secondary | ICD-10-CM

## 2017-11-29 DIAGNOSIS — B001 Herpesviral vesicular dermatitis: Secondary | ICD-10-CM

## 2017-11-29 DIAGNOSIS — C6212 Malignant neoplasm of descended left testis: Secondary | ICD-10-CM

## 2017-11-29 DIAGNOSIS — C792 Secondary malignant neoplasm of skin: Secondary | ICD-10-CM | POA: Diagnosis not present

## 2017-11-29 DIAGNOSIS — L309 Dermatitis, unspecified: Secondary | ICD-10-CM

## 2017-11-29 DIAGNOSIS — Z7901 Long term (current) use of anticoagulants: Secondary | ICD-10-CM

## 2017-11-29 DIAGNOSIS — C6210 Malignant neoplasm of unspecified descended testis: Secondary | ICD-10-CM

## 2017-11-29 DIAGNOSIS — K219 Gastro-esophageal reflux disease without esophagitis: Secondary | ICD-10-CM

## 2017-11-29 DIAGNOSIS — K047 Periapical abscess without sinus: Secondary | ICD-10-CM | POA: Diagnosis not present

## 2017-11-29 DIAGNOSIS — Z7689 Persons encountering health services in other specified circumstances: Secondary | ICD-10-CM

## 2017-11-29 DIAGNOSIS — S91341D Puncture wound with foreign body, right foot, subsequent encounter: Secondary | ICD-10-CM | POA: Diagnosis not present

## 2017-11-29 DIAGNOSIS — Z808 Family history of malignant neoplasm of other organs or systems: Secondary | ICD-10-CM

## 2017-11-29 DIAGNOSIS — T451X5A Adverse effect of antineoplastic and immunosuppressive drugs, initial encounter: Secondary | ICD-10-CM

## 2017-11-29 DIAGNOSIS — Z86718 Personal history of other venous thrombosis and embolism: Secondary | ICD-10-CM

## 2017-11-29 DIAGNOSIS — D701 Agranulocytosis secondary to cancer chemotherapy: Secondary | ICD-10-CM

## 2017-11-29 DIAGNOSIS — D61810 Antineoplastic chemotherapy induced pancytopenia: Secondary | ICD-10-CM

## 2017-11-29 DIAGNOSIS — L97921 Non-pressure chronic ulcer of unspecified part of left lower leg limited to breakdown of skin: Secondary | ICD-10-CM

## 2017-11-29 DIAGNOSIS — D6481 Anemia due to antineoplastic chemotherapy: Secondary | ICD-10-CM

## 2017-11-29 DIAGNOSIS — Z800 Family history of malignant neoplasm of digestive organs: Secondary | ICD-10-CM

## 2017-11-29 DIAGNOSIS — F17210 Nicotine dependence, cigarettes, uncomplicated: Secondary | ICD-10-CM | POA: Diagnosis not present

## 2017-11-29 DIAGNOSIS — I82592 Chronic embolism and thrombosis of other specified deep vein of left lower extremity: Secondary | ICD-10-CM

## 2017-11-29 DIAGNOSIS — R000 Tachycardia, unspecified: Secondary | ICD-10-CM | POA: Diagnosis not present

## 2017-11-29 DIAGNOSIS — Z803 Family history of malignant neoplasm of breast: Secondary | ICD-10-CM | POA: Diagnosis not present

## 2017-11-29 DIAGNOSIS — G620 Drug-induced polyneuropathy: Secondary | ICD-10-CM | POA: Diagnosis not present

## 2017-11-29 DIAGNOSIS — R509 Fever, unspecified: Secondary | ICD-10-CM | POA: Diagnosis not present

## 2017-11-29 LAB — BASIC METABOLIC PANEL
Anion gap: 7 (ref 5–15)
BUN: 22 mg/dL — ABNORMAL HIGH (ref 6–20)
CO2: 23 mmol/L (ref 22–32)
Calcium: 8.9 mg/dL (ref 8.9–10.3)
Chloride: 105 mmol/L (ref 98–111)
Creatinine, Ser: 0.9 mg/dL (ref 0.61–1.24)
GFR calc Af Amer: >60 mL/min (ref >60)
GFR calc non Af Amer: >60 mL/min (ref >60)
Glucose, Bld: 98 mg/dL (ref 70–99)
Potassium: 3.8 mmol/L (ref 3.5–5.1)
Sodium: 135 mmol/L (ref 135–145)

## 2017-11-29 LAB — MAGNESIUM: Magnesium: 2 mg/dL (ref 1.7–2.4)

## 2017-11-29 LAB — CBC WITH DIFFERENTIAL/PLATELET
Basophils Absolute: 0 10*3/uL (ref 0–0.1)
Basophils Relative: 1 %
Eosinophils Absolute: 0.1 10*3/uL (ref 0–0.7)
Eosinophils Relative: 4 %
HCT: 28.2 % — ABNORMAL LOW (ref 40.0–52.0)
Hemoglobin: 9.6 g/dL — ABNORMAL LOW (ref 13.0–18.0)
Lymphocytes Relative: 41 %
Lymphs Abs: 0.7 10*3/uL — ABNORMAL LOW (ref 1.0–3.6)
MCH: 32.3 pg (ref 26.0–34.0)
MCHC: 34 g/dL (ref 32.0–36.0)
MCV: 95 fL (ref 80.0–100.0)
Monocytes Absolute: 0 10*3/uL — ABNORMAL LOW (ref 0.2–1.0)
Monocytes Relative: 0 %
Neutro Abs: 1 10*3/uL — ABNORMAL LOW (ref 1.4–6.5)
Neutrophils Relative %: 54 %
Platelets: 215 10*3/uL (ref 150–440)
RBC: 2.97 MIL/uL — ABNORMAL LOW (ref 4.40–5.90)
RDW: 17.2 % — ABNORMAL HIGH (ref 11.5–14.5)
WBC: 1.8 10*3/uL — ABNORMAL LOW (ref 3.8–10.6)

## 2017-11-29 MED ORDER — PEGFILGRASTIM-CBQV 6 MG/0.6ML ~~LOC~~ SOSY
6.0000 mg | PREFILLED_SYRINGE | Freq: Once | SUBCUTANEOUS | Status: AC
  Administered 2017-11-29: 6 mg via SUBCUTANEOUS
  Filled 2017-11-29: qty 0.6

## 2017-11-29 NOTE — Progress Notes (Signed)
Here for follow up. D/c from hosp 3 days ago after in pt chemo treatments. Today c/o pain  W r foot  -see pain eval. Noted to have yellow tinge to skin/face this am.

## 2017-11-30 ENCOUNTER — Other Ambulatory Visit: Payer: Self-pay | Admitting: Urgent Care

## 2017-11-30 ENCOUNTER — Encounter: Payer: Self-pay | Admitting: Hematology and Oncology

## 2017-11-30 MED ORDER — GABAPENTIN 300 MG PO CAPS: 300.0000 mg | ORAL_CAPSULE | Freq: Every day | ORAL | 2 refills | Status: DC

## 2017-11-30 NOTE — Progress Notes (Signed)
Re:  Medication refill  Patient sent in MyChart message to advise that he was out of his gabapentin. He questioned whether or not it needed to be continued. He indicated that he had forgotten to discuss this with [place] at his visit yesterday.   Patient tasked if he was still having the neuropathy symptoms. He indicated that he was. In light of the continued symptoms, I advised the patient that I would send in a refill of his medication for him.  Refill sent for: 1. Gabapentin 300 mg qhs (Disp #30 r2).  Honor Loh, MSN, APRN, FNP-C, CEN Oncology/Hematology Nurse Practitioner  [facility] 11/30/17, 5:45 PM

## 2017-12-02 ENCOUNTER — Inpatient Hospital Stay (HOSPITAL_BASED_OUTPATIENT_CLINIC_OR_DEPARTMENT_OTHER): Payer: BLUE CROSS/BLUE SHIELD | Admitting: Hematology and Oncology

## 2017-12-02 ENCOUNTER — Other Ambulatory Visit: Payer: Self-pay

## 2017-12-02 ENCOUNTER — Inpatient Hospital Stay: Payer: BLUE CROSS/BLUE SHIELD

## 2017-12-02 ENCOUNTER — Encounter: Payer: Self-pay | Admitting: Hematology and Oncology

## 2017-12-02 VITALS — BP 123/86 | HR 84 | Temp 96.0°F | Resp 18 | Wt 264.1 lb

## 2017-12-02 DIAGNOSIS — G620 Drug-induced polyneuropathy: Secondary | ICD-10-CM

## 2017-12-02 DIAGNOSIS — I82592 Chronic embolism and thrombosis of other specified deep vein of left lower extremity: Secondary | ICD-10-CM | POA: Diagnosis not present

## 2017-12-02 DIAGNOSIS — Z7901 Long term (current) use of anticoagulants: Secondary | ICD-10-CM

## 2017-12-02 DIAGNOSIS — L309 Dermatitis, unspecified: Secondary | ICD-10-CM

## 2017-12-02 DIAGNOSIS — B001 Herpesviral vesicular dermatitis: Secondary | ICD-10-CM | POA: Diagnosis not present

## 2017-12-02 DIAGNOSIS — Z79899 Other long term (current) drug therapy: Secondary | ICD-10-CM

## 2017-12-02 DIAGNOSIS — C6210 Malignant neoplasm of unspecified descended testis: Secondary | ICD-10-CM

## 2017-12-02 DIAGNOSIS — F17210 Nicotine dependence, cigarettes, uncomplicated: Secondary | ICD-10-CM

## 2017-12-02 DIAGNOSIS — S91331D Puncture wound without foreign body, right foot, subsequent encounter: Secondary | ICD-10-CM | POA: Diagnosis not present

## 2017-12-02 DIAGNOSIS — M779 Enthesopathy, unspecified: Secondary | ICD-10-CM | POA: Diagnosis not present

## 2017-12-02 DIAGNOSIS — D61810 Antineoplastic chemotherapy induced pancytopenia: Secondary | ICD-10-CM | POA: Diagnosis not present

## 2017-12-02 DIAGNOSIS — K047 Periapical abscess without sinus: Secondary | ICD-10-CM

## 2017-12-02 DIAGNOSIS — C792 Secondary malignant neoplasm of skin: Secondary | ICD-10-CM

## 2017-12-02 DIAGNOSIS — C6212 Malignant neoplasm of descended left testis: Secondary | ICD-10-CM

## 2017-12-02 DIAGNOSIS — Z7689 Persons encountering health services in other specified circumstances: Secondary | ICD-10-CM | POA: Diagnosis not present

## 2017-12-02 DIAGNOSIS — T451X5A Adverse effect of antineoplastic and immunosuppressive drugs, initial encounter: Secondary | ICD-10-CM

## 2017-12-02 DIAGNOSIS — R509 Fever, unspecified: Secondary | ICD-10-CM | POA: Diagnosis not present

## 2017-12-02 DIAGNOSIS — Z800 Family history of malignant neoplasm of digestive organs: Secondary | ICD-10-CM

## 2017-12-02 DIAGNOSIS — D6481 Anemia due to antineoplastic chemotherapy: Secondary | ICD-10-CM

## 2017-12-02 DIAGNOSIS — Z808 Family history of malignant neoplasm of other organs or systems: Secondary | ICD-10-CM

## 2017-12-02 DIAGNOSIS — K219 Gastro-esophageal reflux disease without esophagitis: Secondary | ICD-10-CM

## 2017-12-02 DIAGNOSIS — M79671 Pain in right foot: Secondary | ICD-10-CM | POA: Diagnosis not present

## 2017-12-02 DIAGNOSIS — Z803 Family history of malignant neoplasm of breast: Secondary | ICD-10-CM

## 2017-12-02 DIAGNOSIS — S91341D Puncture wound with foreign body, right foot, subsequent encounter: Secondary | ICD-10-CM | POA: Diagnosis not present

## 2017-12-02 DIAGNOSIS — I824Y2 Acute embolism and thrombosis of unspecified deep veins of left proximal lower extremity: Secondary | ICD-10-CM

## 2017-12-02 DIAGNOSIS — D701 Agranulocytosis secondary to cancer chemotherapy: Secondary | ICD-10-CM

## 2017-12-02 DIAGNOSIS — R000 Tachycardia, unspecified: Secondary | ICD-10-CM | POA: Diagnosis not present

## 2017-12-02 DIAGNOSIS — E876 Hypokalemia: Secondary | ICD-10-CM

## 2017-12-02 LAB — BASIC METABOLIC PANEL
Anion gap: 5 (ref 5–15)
BUN: 16 mg/dL (ref 6–20)
CO2: 27 mmol/L (ref 22–32)
Calcium: 8.5 mg/dL — ABNORMAL LOW (ref 8.9–10.3)
Chloride: 105 mmol/L (ref 98–111)
Creatinine, Ser: 0.69 mg/dL (ref 0.61–1.24)
GFR calc Af Amer: >60 mL/min (ref >60)
GFR calc non Af Amer: >60 mL/min (ref >60)
Glucose, Bld: 101 mg/dL — ABNORMAL HIGH (ref 70–99)
Potassium: 3.4 mmol/L — ABNORMAL LOW (ref 3.5–5.1)
Sodium: 137 mmol/L (ref 135–145)

## 2017-12-02 LAB — CBC WITH DIFFERENTIAL/PLATELET
Basophils Absolute: 0 10*3/uL (ref 0–0.1)
Basophils Relative: 0 %
Eosinophils Absolute: 0 10*3/uL (ref 0–0.7)
Eosinophils Relative: 1 %
HCT: 22.7 % — ABNORMAL LOW (ref 40.0–52.0)
Hemoglobin: 7.8 g/dL — ABNORMAL LOW (ref 13.0–18.0)
Lymphocytes Relative: 89 %
Lymphs Abs: 0.6 10*3/uL — ABNORMAL LOW (ref 1.0–3.6)
MCH: 32.7 pg (ref 26.0–34.0)
MCHC: 34.4 g/dL (ref 32.0–36.0)
MCV: 95.1 fL (ref 80.0–100.0)
Monocytes Absolute: 0 10*3/uL — ABNORMAL LOW (ref 0.2–1.0)
Monocytes Relative: 6 %
Neutro Abs: 0 10*3/uL — ABNORMAL LOW (ref 1.4–6.5)
Neutrophils Relative %: 4 %
Platelets: 56 10*3/uL — ABNORMAL LOW (ref 150–440)
RBC: 2.39 MIL/uL — ABNORMAL LOW (ref 4.40–5.90)
RDW: 16.6 % — ABNORMAL HIGH (ref 11.5–14.5)
WBC: 0.7 10*3/uL — CL (ref 3.8–10.6)

## 2017-12-02 LAB — MAGNESIUM: Magnesium: 1.7 mg/dL (ref 1.7–2.4)

## 2017-12-02 LAB — SAMPLE TO BLOOD BANK

## 2017-12-02 LAB — PREPARE RBC (CROSSMATCH)

## 2017-12-02 MED ORDER — SODIUM CHLORIDE 0.9% FLUSH
10.0000 mL | Freq: Once | INTRAVENOUS | Status: AC
  Administered 2017-12-02: 10 mL via INTRAVENOUS
  Filled 2017-12-02: qty 10

## 2017-12-02 MED ORDER — HEPARIN SOD (PORK) LOCK FLUSH 100 UNIT/ML IV SOLN: 500.0000 [IU] | Freq: Once | INTRAVENOUS | Status: DC

## 2017-12-02 NOTE — Progress Notes (Signed)
[facility] day:  12/02/2017  Chief Complaint: Edgar [name] is a 27 y.o. male with recurrent testicular cancer who is seen for assessment on day 11 of cycle #3 TIP chemotherapy.  HPI: The patient was last seen in the medical oncology clinic on 11/29/2017.  At that time, he felt "alright".  His left leg was sore and his right foot hurt.  Both sites were unremarkable.  Decision was made to continue prophylactic amoxicillin through the nadir.  Acyclovir was discontinued.  BUN was 22 and creatinine 0.90.  He noted that he was drinking less fluids.  Fluids were encouraged.  Hematocrit was 28.2, hemoglobin 9.6, platelets 215,000, WBC 1800 with ANC 1000.  He received Udencya.  During the interim, patient is doing well overall. He remains fatigued and has varying degrees of exertional shortness of breath. Patient spends the majority of his day resting. He has not returned to his pre-treatment PLOF.    He denies any acute concerns today in clinic. He feels generally well otherwise. Patient having persistent swelling to the RIGHT foot. Patient is scheduled to follow up with podiatry later today. Patient denies that he has experienced any B symptoms. Herpetic lesion to upper lip has resolved. Patient is off prophylactic acyclovir 400 mg BID.  He remains on prophylactic amoxicillin 500 mg BID, which is planned to continue through his nadir assessment on 12/07/2017.  Patient with slight neuropathy in both his hands and feet. Symptoms stable and not imposing any functional limitations or impairing patient's ability to safely ambulate. Patient continues on gabapentin 300 mg at bedtime.   Patient advises that he maintains an adequate appetite. He is eating well. Weight today is 264 lb 1.6 oz (119.8 kg), which compared to his last visit to the clinic, represents a 9 pound weight gain.    Patient complains of pain rated 4/10 pain in the clinic today.    Past Medical  History:  Diagnosis Date  . Asthma    AS A CHILD-NO INHALERS  . Cancer ([place])    testicular cancer 11/2016 per pt   . DVT (deep venous thrombosis) ([place])   . GERD (gastroesophageal reflux disease)    TUMS PRN    Past Surgical History:  Procedure Laterality Date  . ANKLE FRACTURE SURGERY Right 2004  . FINE NEEDLE ASPIRATION BIOPSY N/A 11/09/2017   Procedure: FINE NEEDLE ASPIRATION BIOPSY;  Surgeon: [name], MD;  Location: [facility];  Service: Cardiovascular;  Laterality: N/A;  . ORCHIECTOMY Left 11/26/2015   Procedure: ORCHIECTOMY;  Surgeon: [name], MD;  Location: [facility];  Service: Urology;  Laterality: Left;  radical/Inguinal approach  . PERIPHERAL VASCULAR CATHETERIZATION N/A 12/16/2015   Procedure: [name] Cath Insertion;  Surgeon: [name], MD;  Location: [facility];  Service: Cardiovascular;  Laterality: N/A;  . PERIPHERAL VASCULAR THROMBECTOMY Left 10/13/2017   Procedure: PERIPHERAL VASCULAR THROMBECTOMY;  Surgeon: [name], MD;  Location: [facility];  Service: Cardiovascular;  Laterality: Left;  . WISDOM TOOTH EXTRACTION  2017    Family History  Problem Relation Age of Onset  . Skin cancer Mother   . Breast cancer Maternal Grandmother   . Colon cancer Maternal Grandfather   . Diabetes Maternal Grandfather   . Emphysema Father   . Mental illness Sister        anxiety  . Stroke Paternal Grandmother   . Prostate cancer Neg Hx   . Kidney cancer  Neg Hx   . Bladder Cancer Neg Hx     Social History:  reports that he has been smoking cigarettes. He has a 4.00 pack-year smoking history. He has quit using smokeless tobacco.  His smokeless tobacco use included snuff. He reports that he has current or past drug history. Drug: Marijuana. He reports that he does not drink alcohol.  He smokes 1/2 pack a day.  He smokes marijuana.  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  He has a 72 year-old-daughter named  Edgar [name]. The patient's mother's cell phone number is [phone].  Another contact number is [phone].  The patient lives in [place]. He is accompanied by his mother and father today.   Allergies: No Known Allergies  Current Medications: Current Outpatient Medications  Medication Sig Dispense Refill  . acetaminophen (TYLENOL) 500 MG tablet Take 500-1,000 mg by mouth every 6 (six) hours as needed for mild pain or fever.     Marland Kitchen amoxicillin (AMOXIL) 500 MG tablet Take 1 tablet (500 mg total) by mouth every 8 (eight) hours. 30 tablet 0  . dexamethasone (DECADRON) 4 MG tablet Take 2 tablets (8 mg total) by mouth daily. Take 8 mg by mouth once daily for three days after chemo to prevent nausea 60 tablet 1  . DULoxetine (CYMBALTA) 60 MG capsule Take 1 tablet by mouth daily.    Marland Kitchen gabapentin (NEURONTIN) 300 MG capsule Take 1 capsule (300 mg total) by mouth at bedtime. 30 capsule 2  . loratadine (CLARITIN) 10 MG tablet Take 1 tablet (10 mg total) by mouth daily. 90 tablet 1  . magnesium oxide (MAG-OX) 400 MG tablet Take 1 tablet by mouth daily.    . nicotine (NICODERM CQ - DOSED IN MG/24 HOURS) 21 mg/24hr patch Place 21 mg onto the skin daily.     . potassium chloride SA (K-DUR,KLOR-CON) 20 MEQ tablet Take 1 tablet (20 mEq total) by mouth 2 (two) times daily. 60 tablet 1  . XARELTO 20 MG TABS tablet Take 1 tablet by mouth daily.     No current facility-administered medications for this visit.    Facility-Administered Medications Ordered in Other Visits  Medication Dose Route Frequency Provider Last Rate Last Dose  . heparin lock flush 100 unit/mL  500 Units Intravenous Once Corcoran, [name], MD      . heparin lock flush 100 unit/mL  500 Units Intravenous Once Corcoran, [name], MD      . sodium chloride flush (NS) 0.9 % injection 10 mL  10 mL Intravenous PRN [name], MD   10 mL at 10/21/17 1008    Review of Systems  Constitutional: Positive for malaise/fatigue. Negative for  diaphoresis, fever and weight loss (weight up 9 pounds).  HENT: Negative.   Eyes: Negative.   Respiratory: Positive for shortness of breath (exertional). Negative for cough, hemoptysis and sputum production.   Cardiovascular: Positive for leg swelling (improved BLE edema. RIGHT foot swelling s/p puncture wound). Negative for chest pain, palpitations, orthopnea and PND.  Gastrointestinal: Negative for abdominal pain, blood in stool, constipation, diarrhea, melena, nausea and vomiting.  Genitourinary: Negative for dysuria, frequency, hematuria and urgency.  Musculoskeletal: Negative for back pain, falls, joint pain and myalgias.  Skin: Negative for itching and rash.  Neurological: Positive for sensory change (neuropathy (stable) in hands and feet - on gabapentin). Negative for dizziness, tremors, weakness and headaches.  Endo/Heme/Allergies: Does not bruise/bleed easily.  Psychiatric/Behavioral: Negative for depression, memory loss and suicidal  ideas. The patient is not nervous/anxious and does not have insomnia.   All other systems reviewed and are negative.  Performance status (ECOG): 1-2  Vital Signs BP 123/86 (BP Location: Left Arm, Patient Position: Sitting)   Pulse 84   Temp (!) 96 F (35.6 C) (Tympanic)   Resp 18   Wt 264 lb 1.6 oz (119.8 kg)   BMI 33.01 kg/m   Physical Exam  Constitutional: He is well-developed, well-nourished, and in no distress.  HENT:  Head: Normocephalic and atraumatic. Hair is normal.  Right Ear: External ear normal.  Left Ear: External ear normal.  Mouth/Throat: Abnormal dentition (poor dentition). No oropharyngeal exudate.  Alopecia totalis.  Poor dentition.  Right lower molar extraction site unremarkable.  Eyes: Pupils are equal, round, and reactive to light. EOM are normal. Right eye exhibits no discharge. Left eye exhibits no discharge. No scleral icterus.  Brown eyes  Neck: Normal range of motion. Neck supple. No JVD present.  Cardiovascular:  Normal rate, regular rhythm and normal heart sounds. Exam reveals no gallop.  No murmur (soft flow murmur) heard. Pulmonary/Chest: Effort normal and breath sounds normal. No respiratory distress. He has no wheezes. He has no rales.  Abdominal: Soft. Bowel sounds are normal. He exhibits no distension and no mass. There is no tenderness. There is no rebound and no guarding.  Musculoskeletal: Normal range of motion. He exhibits edema (minimal right foot).       Right ankle: He exhibits swelling. Tenderness (Dorsal aspect of RIGHT foot 2/2 puncture wound).  Lymphadenopathy:    He has no cervical adenopathy.    He has no axillary adenopathy.       Right: No inguinal and no supraclavicular adenopathy present.       Left: No inguinal and no supraclavicular adenopathy present.  Neurological: He is alert. Gait normal.  Skin: Skin is warm. Lesion (Circular lesions to LEFT inner thigh and left calf healing with no s/s of infection. ) noted. No rash noted. No erythema. There is pallor.  Skin lesions stable.  Psychiatric: Mood, affect and judgment normal.  Nursing note and vitals reviewed.  Imaging studies: 11/28/2015:  Chest, abdomen, and pelvic CT revealed no mediastinal adenopathy or suspicious pulmonary nodules.   There were 2 prominent left periaortic retroperitoneal lymph nodes (1.7 cm and 0.9 cm) concerning for testicular nodal metastasis.  There were no abnormal lymph nodes above the renal veins.  There were postsurgical change in the left hemiscrotum and left inguinal canal.  There was a 3.3 cm soft tissue abnormality anterior to the left iliopsoas muscle which could represent a metastatic lymph node (N2 lesion).  12/17/2015:  Head MRI revealed no evidence of metastatic disease.   03/30/2016:  Chest, abdomen, and pelvic CT revealed interval resolution of left abdominal/pelvic lymphadenopathy.  There was no residual metastatic disease. 09/30/2016:  Chest, abdomen, and pelvic CT revealed no evidence  of recurrent or metastatic disease.  09/10/2017:  Bilateral lower extremity duplex revealed evidence of obstruction in the left CFV, SFJ, and proximal femoral vein.  09/10/2017:  CT pelvic venogram revealed multiple retroperitoneal masses including large mass within the left iliacus concerning for metastatic disease in the setting of prior testicular cancer.  Mass in the left iliacus caused significant narrowing of the proximal left external iliac vein. There was filling defect within the distal left external iliac vein extending into the common femoral and proximal superficial femoral veins consistent with thrombus  09/11/2017:  Chest, abdomen, and pelvic CT revealed enlarged and  morphologically abnormal 4.3 x 4.9 cm left pelvic sidewall and retroperitoneal lymph nodes (measuring up to 2.9 cm) concerning for metastatic disease given history of testicular cancer. Biopsy was suggested for confirmation. Prominent left inguinal lymph nodes could be metastatic or reactive in nature.  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  There was anasarca/swelling of the left lower extremity.  There was no evidence of metastatic disease in the chest. 09/13/2017:  Head MRI revealed no evidence of metastatic disease. 11/17/2017:  Right foot MRI revealed artifact in the plantar soft tissues over the first metatarsal head likely representing metallic foreign bodies as seen on previous radiographs.  There was an area of increased T2 signal intensity in the plantar aspect of the first metatarsal head with focal enhancement may indicate an area of focal osteomyelitis.  There was no soft tissue abscess.   No visits with results within 3 Day(s) from this visit.  Latest known visit with results is:  Appointment on 11/29/2017  Component Date Value Ref Range Status  . Sodium 11/29/2017 135  135 - 145 mmol/L Final  . Potassium 11/29/2017 3.8  3.5 - 5.1 mmol/L  Final  . Chloride 11/29/2017 105  98 - 111 mmol/L Final  . CO2 11/29/2017 23  22 - 32 mmol/L Final  . Glucose, Bld 11/29/2017 98  70 - 99 mg/dL Final  . BUN 11/29/2017 22* 6 - 20 mg/dL Final  . Creatinine, Ser 11/29/2017 0.90  0.61 - 1.24 mg/dL Final  . Calcium 11/29/2017 8.9  8.9 - 10.3 mg/dL Final  . GFR calc non Af Amer 11/29/2017 >60  >60 mL/min Final  . GFR calc Af Amer 11/29/2017 >60  >60 mL/min Final   Comment: (NOTE) The eGFR has been calculated using the CKD EPI equation. This calculation has not been validated in all clinical situations. eGFR's persistently <60 mL/min signify possible Chronic Kidney Disease.   Georgiann Hahn gap 11/29/2017 7  5 - 15 Final   Performed at [facility], [address] [place], [place] [address]  . WBC 11/29/2017 1.8* 3.8 - 10.6 K/uL Final  . RBC 11/29/2017 2.97* 4.40 - 5.90 MIL/uL Final  . Hemoglobin 11/29/2017 9.6* 13.0 - 18.0 g/dL Final  . HCT 11/29/2017 28.2* 40.0 - 52.0 % Final  . MCV 11/29/2017 95.0  80.0 - 100.0 fL Final  . MCH 11/29/2017 32.3  26.0 - 34.0 pg Final  . MCHC 11/29/2017 34.0  32.0 - 36.0 g/dL Final  . RDW 11/29/2017 17.2* 11.5 - 14.5 % Final  . Platelets 11/29/2017 215  150 - 440 K/uL Final  . Neutrophils Relative % 11/29/2017 54  % Final  . Neutro Abs 11/29/2017 1.0* 1.4 - 6.5 K/uL Final  . Lymphocytes Relative 11/29/2017 41  % Final  . Lymphs Abs 11/29/2017 0.7* 1.0 - 3.6 K/uL Final  . Monocytes Relative 11/29/2017 0  % Final  . Monocytes Absolute 11/29/2017 0.0* 0.2 - 1.0 K/uL Final  . Eosinophils Relative 11/29/2017 4  % Final  . Eosinophils Absolute 11/29/2017 0.1  0 - 0.7 K/uL Final  . Basophils Relative 11/29/2017 1  % Final  . Basophils Absolute 11/29/2017 0.0  0 - 0.1 K/uL Final   Performed at [facility], [address], [place], [place] [address]  . Magnesium 11/29/2017 2.0  1.7 - 2.4 mg/dL Final   Performed at [facility], [address], [place], [place] [address]    Assessment:  Dorothea Ogle  JAHMARI ESBENSHADE is a 27 y.o. male with recurrent testicular cancer.  He presented with a left lower extremity DVT on 09/10/2017.  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  Clinical/pathologic stage is T1 N1-2 S0 M0.  He received 3 cycles of  BEP (12/30/2015 - 02/24/2016).  He received OnPro Neulasta with cycle #2 and #3.  He declined evaluation for retroperitoneal lymph node dissection (RPLND).  He declined sperm banking.  Pre surgery labs on 11/20/2015 revealed an AFP of 411.9, beta-HCG 2,669, and LDH 522 (121-224).    Tumor markers have been followed:   AFP was 411.9 (0-8.3) on 11/20/2015, 11.2 on 12/19/2015, 6.3 on 12/23/2015, 1.3 on 01/27/2016, 1.8 on 02/17/2016, 1.9 on 03/30/2016, 1.4 on 05/27/2016, 1.0 on 07/29/2016, 1.4 on 10/14/2016, 3.3 on 12/24/2016, 1 on 09/10/2017, 1 on 09/20/2017,  2.6 on 10/21/2017, and 2.1 on 11/16/2017.    Beta-HCG was 2,669 (0-3) on 11/20/2015, 2 on 12/19/2015, 1 on 12/23/2015, < 1 on 01/27/2016, < 1 on 02/17/2016, < 1 on 03/30/2016, < 1 on 05/27/2016,  < 1 on 07/29/2016, < 1 on 10/14/2016, and 3 on 12/24/2016, < 5 on 09/11/2017,  < 5 on 09/20/2017, < 1 on 10/21/2017, and < 1 on 11/16/2017.  LDH was 522 (98-192) on 11/20/2015, 179 on 12/19/2015, 160 on 12/23/2015, 202 on 01/27/2016, 156 on 02/10/2016, 161 on 02/17/2016, 172 on 03/30/2016, 178 on 05/27/2016, 150 on 07/29/2016, 152 on 10/14/2016, 145 on 12/24/2016, 1557 on 09/11/2017, 916 on 09/20/2017, 144 on 10/21/2017, and 158 on 11/16/2017.  Chest, abdomen, and pelvic CT on 09/11/2017 revealed enlarged and morphologically abnormal 4.3 x 4.9 cm left pelvic sidewall and retroperitoneal lymph nodes (measuring up to 2.9 cm).  There was persistent left external iliac vein thrombus, better evaluated on  prior CT venogram dated 09/10/2017.  There was anasarca/swelling of the left lower extremity.  There was no evidence of metastatic disease in the chest.  Head MRI on 09/13/2017 revealed no evidence of metastatic disease.   He has a left lower extremity DVT.  Bilateral lower extremity duplex on 09/10/2017 revealed evidence of obstruction in the left CFV, SFJ, and proximal femoral vein.  He is on Xarelto.  He is currently day 11 s/p cycle #3 TIP chemotherapy (09/21/2017 - 11/27/2017) with Margarette Canada support. Cycle #1 was administered at [facility]. Cycles #2 and 3 were administered at [facility].  Both cycles 1 and 2 were complicated by lower extremity skin lesions on recovery of counts.  Audiogram on 10/27/2017 revealed hearing within normal limits.  He was diagnosed with cellulitis of the left lower extremity on 10/06/2017.  He was on clindamycin.  While hospitalized (10/08/2017 - 10/16/2017), he received 9 days of vancomycin and Cefepime.  He was discharged on Flagyl, doxycycline, and Levaquin.  He was admitted to [facility] from 10/08/2017 - 10/16/2017 with left lower extremity cellulitis.  He was treated with Cefepime and vancomycin.  Imaging revealed significant improvement in adenopathy with decreased pelvic sidewall disease and decreased bulk at aortic bifurcation.  He was felt to have venous congestion syndrome with skin lesions due to congestion.   He was admitted to [facility] oncology floor from 10/21/2017 - 10/26/2017 for inpatient chemotherapy (cycle #2 TIP).  Was seen in consult by Dr. Tsosie Billing (infectious disease) who felt the patient had been treated adequately with antibiotic  coverage and not require follow-up further treatment for his left lower extremity cellulitis.  CBC on the day of discharge revealed a WBC 4700 ([place] 4600).  Hemoglobin 10.0, hematocrit 28.0, MCV 91.4, and platelets 371,000.    He was admitted to [facility] from 11/05/2017 - 11/09/2017 for cellulitis. He was treated with Cefepime and  vancomycin.  He began oral acyclovir for herpetic labialis.  Skin biopsy revealed diffuse neutrophilic dermatitis. Cultures were negative.  He was admitted to [facility] from 11/05/2017 - 11/09/2017 for cellulitis.  He was developing similar lower extremity skin lesions following his first cycle of chemotherapy.  Xarelto was held as he was thrombocytopenic (nadir platelet count 39,000).  He was treated with heparin.  Lower extremity duplex revealed no clot.  He was treated with Cefepime and vancomycin.  He was discharged on Augmentin.  He received oral acyclovir for herpetic labialis.  He underwent IVC filterplacement, percutaneous angioplastyand stent placementof the left common iliac vein and left external iliac vein on 10/13/2017.  He was discharged on Flagyl, doxycycline, and Levaquin.  Hypercoagulable work-up on 10/12/2017 negative for the following: Factor V Leiden, prothrombin gene mutation, lupus anticoagulant, anticardiolipin antibodies, beta-2 glycoprotein, protein C activity/antigen, protein S activity/antigen. ATIII was 54% (75-120) on heparin.  Right foot MRI on 11/17/2017 revealed artifact in the plantar soft tissues over the first metatarsal head likely representing metallic foreign bodies as seen on previous radiographs.  There was an area of increased T2 signal intensity in the plantar aspect of the first metatarsal head with focal enhancement may indicate an area of focal osteomyelitis.  There was no soft tissue abscess.  He has a right lower molar abscess s/p extraction on 11/19/2017.  Symptomatically, patient remains fatigued following treatment. He has yet to return to his pre-treatment PLOF. He has shortness of breath with exertion. He is pale in clinic.Marland Kitchen Patient denies that he has experienced any B symptoms. He denies any interval infections. Exam reveals improving edema to BILATERAL lower extremities and to RIGHT foot. There is a puncture wound to the dorsal aspect of the RIGHT  foot that exhibits no evidence of infection. WBC 700 (ANC 0). Hemoglobin 7.8, hematocrit 22.7, and platelets 56,000. Potassium 3.4.   Plan: 1. Labs today: CBC with diff, BMP, Mg, hold tube. 2. Recurrent testicular cancer  Doing well overall following treatment.   Minimal side effects; no nausea, vomiting, diarrhea, or fevers.   Discuss symptom management.  Patient has antiemetics at home to use on a PRN basis. Interventions effective when needed. Continue as previously prescribed.   Assuming adequate count recovery, and in the absence of other unforseen complications, anticipate inpatient admission for cycle #4 TIP chemotherapy on 12/13/2017. 3. Chemotherapy induced pancytopenia  Neutropenia: WBC 700 (ANC 0). Reviewed strict neutropenic precautions, including avoiding sick contacts and handwashing. Patient to call clinic with any fevers.   Anemia: Hemoglobin 7.8 and hematocrit 22.7. Patient pale and exertionally dyspneic. No tachycardia. Will recheck labs and transfuse 1 unit of irradiated PRBCs on 12/03/2017.  Thrombocytopenia: platelets stable at 56,000. Will recheck tomorrow to assess for further decline. Patient may require platelet transfusion for counts < 20,000.  If platelets drop, will need to hold Xarelto. 4. Chemotherapy induced neuropathy  Stable and not imposing any degree of functional limitations.   Continue gabapentin 300 mg at bedtime.  5. Cellulitis and skin lesions  Continues to improve. No signs of recurrent infection. Continue to monitor.  6. Herpetic lip lesion  Resolved.  7. LEFT lower extremity DVT  Stable  on rivaroxaban. Platelets low at 56,000.     Check platelet count tomorrow as may need to hold Xarelto.  Plan for long term anticoagulation with stent in place.  IVC filter to be removed after completion of chemotherapy. 8. RIGHT foot wound (nail puncture)  Remains tender. No signs of infection.   Following up with podiatry Dr. Cleda Mccreedy  today. 9. Electrolyte wasting  Mg stable at 1.7.   K+ low at 3.4. Prefer K+ rich foods.   Continue oral supplementation.  Will continue routine lab monitoring.  10. Pain and symptom management  Pain is well controlled on current regimen. Use has been low.   Continue Oxycodone 10 mg q8 PRN.  11. RTC on 12/03/2017 for labs (CBC with diff) and +/- PRBCs or platelets.  12. RTC on 12/07/2017 for MD assessment, labs (CBC with diff, CMP, Mg), +/- IVFs with Mg/K+   Honor Loh, NP  12/02/2017, 9:05 AM   I saw and evaluated the patient, participating in the key portions of the service and reviewing pertinent diagnostic studies and records.  I reviewed the nurse practitioner's note and agree with the findings and the plan.  The assessment and plan were discussed with the patient.  Multiple questions were asked by the patient and answered.   Nolon Stalls, MD 12/02/2017,9:05 AM

## 2017-12-02 NOTE — Progress Notes (Signed)
Patient here for follow. C/o pain and swelling to right foot.

## 2017-12-03 ENCOUNTER — Inpatient Hospital Stay: Payer: BLUE CROSS/BLUE SHIELD

## 2017-12-03 DIAGNOSIS — I82592 Chronic embolism and thrombosis of other specified deep vein of left lower extremity: Secondary | ICD-10-CM | POA: Diagnosis not present

## 2017-12-03 DIAGNOSIS — C6210 Malignant neoplasm of unspecified descended testis: Secondary | ICD-10-CM

## 2017-12-03 DIAGNOSIS — D6481 Anemia due to antineoplastic chemotherapy: Secondary | ICD-10-CM

## 2017-12-03 DIAGNOSIS — T451X5A Adverse effect of antineoplastic and immunosuppressive drugs, initial encounter: Secondary | ICD-10-CM

## 2017-12-03 DIAGNOSIS — F17210 Nicotine dependence, cigarettes, uncomplicated: Secondary | ICD-10-CM | POA: Diagnosis not present

## 2017-12-03 DIAGNOSIS — S91341D Puncture wound with foreign body, right foot, subsequent encounter: Secondary | ICD-10-CM | POA: Diagnosis not present

## 2017-12-03 DIAGNOSIS — L982 Febrile neutrophilic dermatosis [Sweet]: Secondary | ICD-10-CM | POA: Diagnosis not present

## 2017-12-03 DIAGNOSIS — F329 Major depressive disorder, single episode, unspecified: Secondary | ICD-10-CM | POA: Diagnosis not present

## 2017-12-03 DIAGNOSIS — Z7901 Long term (current) use of anticoagulants: Secondary | ICD-10-CM

## 2017-12-03 DIAGNOSIS — Z7689 Persons encountering health services in other specified circumstances: Secondary | ICD-10-CM | POA: Diagnosis not present

## 2017-12-03 DIAGNOSIS — B001 Herpesviral vesicular dermatitis: Secondary | ICD-10-CM | POA: Diagnosis not present

## 2017-12-03 DIAGNOSIS — L988 Other specified disorders of the skin and subcutaneous tissue: Secondary | ICD-10-CM | POA: Diagnosis not present

## 2017-12-03 DIAGNOSIS — D649 Anemia, unspecified: Secondary | ICD-10-CM | POA: Diagnosis not present

## 2017-12-03 DIAGNOSIS — D61810 Antineoplastic chemotherapy induced pancytopenia: Secondary | ICD-10-CM | POA: Diagnosis not present

## 2017-12-03 DIAGNOSIS — Z452 Encounter for adjustment and management of vascular access device: Secondary | ICD-10-CM | POA: Diagnosis not present

## 2017-12-03 DIAGNOSIS — I82412 Acute embolism and thrombosis of left femoral vein: Secondary | ICD-10-CM | POA: Diagnosis not present

## 2017-12-03 DIAGNOSIS — K047 Periapical abscess without sinus: Secondary | ICD-10-CM | POA: Diagnosis not present

## 2017-12-03 DIAGNOSIS — C6212 Malignant neoplasm of descended left testis: Secondary | ICD-10-CM | POA: Diagnosis not present

## 2017-12-03 DIAGNOSIS — R509 Fever, unspecified: Secondary | ICD-10-CM | POA: Diagnosis not present

## 2017-12-03 DIAGNOSIS — G620 Drug-induced polyneuropathy: Secondary | ICD-10-CM | POA: Diagnosis not present

## 2017-12-03 DIAGNOSIS — C792 Secondary malignant neoplasm of skin: Secondary | ICD-10-CM | POA: Diagnosis not present

## 2017-12-03 DIAGNOSIS — R000 Tachycardia, unspecified: Secondary | ICD-10-CM | POA: Diagnosis not present

## 2017-12-03 DIAGNOSIS — L309 Dermatitis, unspecified: Secondary | ICD-10-CM | POA: Diagnosis not present

## 2017-12-03 DIAGNOSIS — F321 Major depressive disorder, single episode, moderate: Secondary | ICD-10-CM | POA: Diagnosis not present

## 2017-12-03 DIAGNOSIS — L03116 Cellulitis of left lower limb: Secondary | ICD-10-CM | POA: Diagnosis not present

## 2017-12-03 DIAGNOSIS — K219 Gastro-esophageal reflux disease without esophagitis: Secondary | ICD-10-CM | POA: Diagnosis not present

## 2017-12-03 DIAGNOSIS — J45909 Unspecified asthma, uncomplicated: Secondary | ICD-10-CM | POA: Diagnosis not present

## 2017-12-03 DIAGNOSIS — Z792 Long term (current) use of antibiotics: Secondary | ICD-10-CM | POA: Diagnosis not present

## 2017-12-03 DIAGNOSIS — I824Y2 Acute embolism and thrombosis of unspecified deep veins of left proximal lower extremity: Secondary | ICD-10-CM | POA: Diagnosis not present

## 2017-12-03 DIAGNOSIS — Z79891 Long term (current) use of opiate analgesic: Secondary | ICD-10-CM | POA: Diagnosis not present

## 2017-12-03 DIAGNOSIS — Z9181 History of falling: Secondary | ICD-10-CM | POA: Diagnosis not present

## 2017-12-03 DIAGNOSIS — C6290 Malignant neoplasm of unspecified testis, unspecified whether descended or undescended: Secondary | ICD-10-CM | POA: Diagnosis not present

## 2017-12-03 LAB — CBC WITH DIFFERENTIAL/PLATELET
Basophils Absolute: 0 10*3/uL (ref 0–0.1)
Basophils Relative: 0 %
Eosinophils Absolute: 0 10*3/uL (ref 0–0.7)
Eosinophils Relative: 1 %
HCT: 22.5 % — ABNORMAL LOW (ref 40.0–52.0)
Hemoglobin: 7.8 g/dL — ABNORMAL LOW (ref 13.0–18.0)
Lymphocytes Relative: 79 %
Lymphs Abs: 0.3 10*3/uL — ABNORMAL LOW (ref 1.0–3.6)
MCH: 32.7 pg (ref 26.0–34.0)
MCHC: 34.6 g/dL (ref 32.0–36.0)
MCV: 94.6 fL (ref 80.0–100.0)
Monocytes Absolute: 0.1 10*3/uL — ABNORMAL LOW (ref 0.2–1.0)
Monocytes Relative: 18 %
Neutro Abs: 0 10*3/uL — ABNORMAL LOW (ref 1.4–6.5)
Neutrophils Relative %: 2 %
Platelets: 33 10*3/uL — ABNORMAL LOW (ref 150–440)
RBC: 2.38 MIL/uL — ABNORMAL LOW (ref 4.40–5.90)
RDW: 16.4 % — ABNORMAL HIGH (ref 11.5–14.5)
WBC: 0.4 10*3/uL — CL (ref 3.8–10.6)

## 2017-12-03 MED ORDER — SODIUM CHLORIDE 0.9% FLUSH
10.0000 mL | INTRAVENOUS | Status: DC | PRN
  Administered 2017-12-03: 10 mL via INTRAVENOUS
  Filled 2017-12-03: qty 10

## 2017-12-03 MED ORDER — DIPHENHYDRAMINE HCL 25 MG PO CAPS
25.0000 mg | ORAL_CAPSULE | Freq: Once | ORAL | Status: AC
  Administered 2017-12-03: 25 mg via ORAL
  Filled 2017-12-03: qty 1

## 2017-12-03 MED ORDER — HEPARIN SOD (PORK) LOCK FLUSH 100 UNIT/ML IV SOLN
INTRAVENOUS | Status: AC
  Filled 2017-12-03: qty 5

## 2017-12-03 MED ORDER — ACETAMINOPHEN 325 MG PO TABS
650.0000 mg | ORAL_TABLET | Freq: Once | ORAL | Status: AC
  Administered 2017-12-03: 650 mg via ORAL
  Filled 2017-12-03: qty 2

## 2017-12-03 MED ORDER — HEPARIN SOD (PORK) LOCK FLUSH 100 UNIT/ML IV SOLN
500.0000 [IU] | Freq: Once | INTRAVENOUS | Status: AC
  Administered 2017-12-03: 500 [IU] via INTRAVENOUS
  Filled 2017-12-03: qty 5

## 2017-12-03 MED ORDER — SODIUM CHLORIDE 0.9% IV SOLUTION
250.0000 mL | Freq: Once | INTRAVENOUS | Status: AC
  Administered 2017-12-03: 250 mL via INTRAVENOUS
  Filled 2017-12-03: qty 250

## 2017-12-04 LAB — BPAM RBC
Blood Product Expiration Date: 201910142359
ISSUE DATE / TIME: 201909200946
UNIT TYPE AND RH: 6200

## 2017-12-04 LAB — TYPE AND SCREEN
ABO/RH(D): A POS
Antibody Screen: NEGATIVE
Unit division: 0

## 2017-12-05 ENCOUNTER — Encounter: Payer: Self-pay | Admitting: Hematology and Oncology

## 2017-12-06 ENCOUNTER — Other Ambulatory Visit: Payer: Self-pay | Admitting: *Deleted

## 2017-12-06 ENCOUNTER — Telehealth: Payer: Self-pay | Admitting: *Deleted

## 2017-12-06 ENCOUNTER — Inpatient Hospital Stay: Payer: BLUE CROSS/BLUE SHIELD

## 2017-12-06 DIAGNOSIS — G620 Drug-induced polyneuropathy: Secondary | ICD-10-CM | POA: Diagnosis not present

## 2017-12-06 DIAGNOSIS — D61810 Antineoplastic chemotherapy induced pancytopenia: Secondary | ICD-10-CM | POA: Diagnosis not present

## 2017-12-06 DIAGNOSIS — K219 Gastro-esophageal reflux disease without esophagitis: Secondary | ICD-10-CM | POA: Diagnosis not present

## 2017-12-06 DIAGNOSIS — R509 Fever, unspecified: Secondary | ICD-10-CM | POA: Diagnosis not present

## 2017-12-06 DIAGNOSIS — S91341D Puncture wound with foreign body, right foot, subsequent encounter: Secondary | ICD-10-CM | POA: Diagnosis not present

## 2017-12-06 DIAGNOSIS — R000 Tachycardia, unspecified: Secondary | ICD-10-CM | POA: Diagnosis not present

## 2017-12-06 DIAGNOSIS — B001 Herpesviral vesicular dermatitis: Secondary | ICD-10-CM | POA: Diagnosis not present

## 2017-12-06 DIAGNOSIS — K047 Periapical abscess without sinus: Secondary | ICD-10-CM | POA: Diagnosis not present

## 2017-12-06 DIAGNOSIS — C6212 Malignant neoplasm of descended left testis: Secondary | ICD-10-CM | POA: Diagnosis not present

## 2017-12-06 DIAGNOSIS — C792 Secondary malignant neoplasm of skin: Secondary | ICD-10-CM | POA: Diagnosis not present

## 2017-12-06 DIAGNOSIS — C6210 Malignant neoplasm of unspecified descended testis: Secondary | ICD-10-CM

## 2017-12-06 DIAGNOSIS — F17210 Nicotine dependence, cigarettes, uncomplicated: Secondary | ICD-10-CM | POA: Diagnosis not present

## 2017-12-06 DIAGNOSIS — T451X5A Adverse effect of antineoplastic and immunosuppressive drugs, initial encounter: Secondary | ICD-10-CM | POA: Diagnosis not present

## 2017-12-06 DIAGNOSIS — L309 Dermatitis, unspecified: Secondary | ICD-10-CM | POA: Diagnosis not present

## 2017-12-06 DIAGNOSIS — Z7689 Persons encountering health services in other specified circumstances: Secondary | ICD-10-CM | POA: Diagnosis not present

## 2017-12-06 DIAGNOSIS — I82592 Chronic embolism and thrombosis of other specified deep vein of left lower extremity: Secondary | ICD-10-CM | POA: Diagnosis not present

## 2017-12-06 LAB — CBC WITH DIFFERENTIAL/PLATELET
Basophils Absolute: 0 10*3/uL (ref 0–0.1)
Basophils Relative: 0 %
Eosinophils Absolute: 0 10*3/uL (ref 0–0.7)
Eosinophils Relative: 0 %
HCT: 25.2 % — ABNORMAL LOW (ref 40.0–52.0)
Hemoglobin: 8.6 g/dL — ABNORMAL LOW (ref 13.0–18.0)
Lymphocytes Relative: 11 %
Lymphs Abs: 0.7 10*3/uL — ABNORMAL LOW (ref 1.0–3.6)
MCH: 32.3 pg (ref 26.0–34.0)
MCHC: 34.2 g/dL (ref 32.0–36.0)
MCV: 94.4 fL (ref 80.0–100.0)
Monocytes Absolute: 0.4 10*3/uL (ref 0.2–1.0)
Monocytes Relative: 6 %
Neutro Abs: 5.6 10*3/uL (ref 1.4–6.5)
Neutrophils Relative %: 83 %
Platelets: 22 10*3/uL — CL (ref 150–400)
RBC: 2.67 MIL/uL — ABNORMAL LOW (ref 4.40–5.90)
RDW: 16.6 % — ABNORMAL HIGH (ref 11.5–14.5)
WBC: 6.8 10*3/uL (ref 3.8–10.6)

## 2017-12-06 NOTE — Telephone Encounter (Signed)
Attempted to call patient on home phone as well as cell phone.  No answer.  Called patient's mother, Manuela Schwartz and LVM that patient needs to come to clinic for lab draw this morning to check his platelets.

## 2017-12-07 ENCOUNTER — Inpatient Hospital Stay: Payer: BLUE CROSS/BLUE SHIELD

## 2017-12-07 ENCOUNTER — Other Ambulatory Visit: Payer: Self-pay

## 2017-12-07 ENCOUNTER — Inpatient Hospital Stay (HOSPITAL_BASED_OUTPATIENT_CLINIC_OR_DEPARTMENT_OTHER): Payer: BLUE CROSS/BLUE SHIELD | Admitting: Hematology and Oncology

## 2017-12-07 ENCOUNTER — Inpatient Hospital Stay
Admission: AD | Admit: 2017-12-07 | Discharge: 2017-12-11 | DRG: 607 | Disposition: A | Discharge status: Home Health | Payer: BLUE CROSS/BLUE SHIELD | Source: Ambulatory Visit | Attending: Internal Medicine | Admitting: Internal Medicine

## 2017-12-07 ENCOUNTER — Other Ambulatory Visit: Payer: Self-pay | Admitting: Hematology and Oncology

## 2017-12-07 ENCOUNTER — Encounter: Payer: Self-pay | Admitting: Hematology and Oncology

## 2017-12-07 VITALS — BP 121/60 | HR 130 | Temp 102.2°F | Resp 18 | Wt 274.4 lb

## 2017-12-07 DIAGNOSIS — L982 Febrile neutrophilic dermatosis [Sweet]: Principal | ICD-10-CM

## 2017-12-07 DIAGNOSIS — Z86718 Personal history of other venous thrombosis and embolism: Secondary | ICD-10-CM

## 2017-12-07 DIAGNOSIS — Z95828 Presence of other vascular implants and grafts: Secondary | ICD-10-CM | POA: Diagnosis not present

## 2017-12-07 DIAGNOSIS — R112 Nausea with vomiting, unspecified: Secondary | ICD-10-CM | POA: Diagnosis not present

## 2017-12-07 DIAGNOSIS — I82412 Acute embolism and thrombosis of left femoral vein: Secondary | ICD-10-CM | POA: Diagnosis not present

## 2017-12-07 DIAGNOSIS — D6481 Anemia due to antineoplastic chemotherapy: Secondary | ICD-10-CM

## 2017-12-07 DIAGNOSIS — E876 Hypokalemia: Secondary | ICD-10-CM | POA: Diagnosis not present

## 2017-12-07 DIAGNOSIS — D649 Anemia, unspecified: Secondary | ICD-10-CM

## 2017-12-07 DIAGNOSIS — Z79899 Other long term (current) drug therapy: Secondary | ICD-10-CM

## 2017-12-07 DIAGNOSIS — Z7901 Long term (current) use of anticoagulants: Secondary | ICD-10-CM

## 2017-12-07 DIAGNOSIS — J45909 Unspecified asthma, uncomplicated: Secondary | ICD-10-CM | POA: Diagnosis not present

## 2017-12-07 DIAGNOSIS — Z818 Family history of other mental and behavioral disorders: Secondary | ICD-10-CM

## 2017-12-07 DIAGNOSIS — Z9079 Acquired absence of other genital organ(s): Secondary | ICD-10-CM

## 2017-12-07 DIAGNOSIS — D6959 Other secondary thrombocytopenia: Secondary | ICD-10-CM | POA: Diagnosis present

## 2017-12-07 DIAGNOSIS — T458X5A Adverse effect of other primarily systemic and hematological agents, initial encounter: Secondary | ICD-10-CM | POA: Diagnosis not present

## 2017-12-07 DIAGNOSIS — C6290 Malignant neoplasm of unspecified testis, unspecified whether descended or undescended: Secondary | ICD-10-CM | POA: Diagnosis not present

## 2017-12-07 DIAGNOSIS — D61810 Antineoplastic chemotherapy induced pancytopenia: Secondary | ICD-10-CM | POA: Diagnosis not present

## 2017-12-07 DIAGNOSIS — T451X5A Adverse effect of antineoplastic and immunosuppressive drugs, initial encounter: Secondary | ICD-10-CM

## 2017-12-07 DIAGNOSIS — R1032 Left lower quadrant pain: Secondary | ICD-10-CM | POA: Diagnosis present

## 2017-12-07 DIAGNOSIS — C6210 Malignant neoplasm of unspecified descended testis: Secondary | ICD-10-CM

## 2017-12-07 DIAGNOSIS — F064 Anxiety disorder due to known physiological condition: Secondary | ICD-10-CM | POA: Diagnosis present

## 2017-12-07 DIAGNOSIS — Z808 Family history of malignant neoplasm of other organs or systems: Secondary | ICD-10-CM | POA: Diagnosis not present

## 2017-12-07 DIAGNOSIS — G620 Drug-induced polyneuropathy: Secondary | ICD-10-CM | POA: Diagnosis not present

## 2017-12-07 DIAGNOSIS — S91341D Puncture wound with foreign body, right foot, subsequent encounter: Secondary | ICD-10-CM

## 2017-12-07 DIAGNOSIS — R000 Tachycardia, unspecified: Secondary | ICD-10-CM

## 2017-12-07 DIAGNOSIS — D696 Thrombocytopenia, unspecified: Secondary | ICD-10-CM

## 2017-12-07 DIAGNOSIS — R509 Fever, unspecified: Secondary | ICD-10-CM | POA: Diagnosis not present

## 2017-12-07 DIAGNOSIS — Z7689 Persons encountering health services in other specified circumstances: Secondary | ICD-10-CM | POA: Diagnosis not present

## 2017-12-07 DIAGNOSIS — F17210 Nicotine dependence, cigarettes, uncomplicated: Secondary | ICD-10-CM | POA: Diagnosis present

## 2017-12-07 DIAGNOSIS — T373X5A Adverse effect of other antiprotozoal drugs, initial encounter: Secondary | ICD-10-CM | POA: Diagnosis not present

## 2017-12-07 DIAGNOSIS — Z800 Family history of malignant neoplasm of digestive organs: Secondary | ICD-10-CM | POA: Diagnosis not present

## 2017-12-07 DIAGNOSIS — K047 Periapical abscess without sinus: Secondary | ICD-10-CM | POA: Diagnosis not present

## 2017-12-07 DIAGNOSIS — Z7952 Long term (current) use of systemic steroids: Secondary | ICD-10-CM

## 2017-12-07 DIAGNOSIS — Z8547 Personal history of malignant neoplasm of testis: Secondary | ICD-10-CM

## 2017-12-07 DIAGNOSIS — Z136 Encounter for screening for cardiovascular disorders: Secondary | ICD-10-CM | POA: Diagnosis not present

## 2017-12-07 DIAGNOSIS — Z95820 Peripheral vascular angioplasty status with implants and grafts: Secondary | ICD-10-CM | POA: Diagnosis not present

## 2017-12-07 DIAGNOSIS — K219 Gastro-esophageal reflux disease without esophagitis: Secondary | ICD-10-CM | POA: Diagnosis present

## 2017-12-07 DIAGNOSIS — R600 Localized edema: Secondary | ICD-10-CM | POA: Diagnosis not present

## 2017-12-07 DIAGNOSIS — C6212 Malignant neoplasm of descended left testis: Secondary | ICD-10-CM | POA: Diagnosis not present

## 2017-12-07 DIAGNOSIS — A419 Sepsis, unspecified organism: Secondary | ICD-10-CM | POA: Diagnosis not present

## 2017-12-07 DIAGNOSIS — Z716 Tobacco abuse counseling: Secondary | ICD-10-CM | POA: Diagnosis not present

## 2017-12-07 DIAGNOSIS — L989 Disorder of the skin and subcutaneous tissue, unspecified: Secondary | ICD-10-CM | POA: Diagnosis not present

## 2017-12-07 DIAGNOSIS — I824Z2 Acute embolism and thrombosis of unspecified deep veins of left distal lower extremity: Secondary | ICD-10-CM | POA: Diagnosis not present

## 2017-12-07 DIAGNOSIS — T451X5S Adverse effect of antineoplastic and immunosuppressive drugs, sequela: Secondary | ICD-10-CM | POA: Diagnosis not present

## 2017-12-07 DIAGNOSIS — L309 Dermatitis, unspecified: Secondary | ICD-10-CM | POA: Diagnosis not present

## 2017-12-07 DIAGNOSIS — R21 Rash and other nonspecific skin eruption: Secondary | ICD-10-CM | POA: Diagnosis not present

## 2017-12-07 DIAGNOSIS — L986 Other infiltrative disorders of the skin and subcutaneous tissue: Secondary | ICD-10-CM | POA: Diagnosis not present

## 2017-12-07 DIAGNOSIS — Z9221 Personal history of antineoplastic chemotherapy: Secondary | ICD-10-CM | POA: Diagnosis not present

## 2017-12-07 DIAGNOSIS — I82592 Chronic embolism and thrombosis of other specified deep vein of left lower extremity: Secondary | ICD-10-CM | POA: Diagnosis not present

## 2017-12-07 DIAGNOSIS — B001 Herpesviral vesicular dermatitis: Secondary | ICD-10-CM | POA: Diagnosis not present

## 2017-12-07 DIAGNOSIS — C792 Secondary malignant neoplasm of skin: Secondary | ICD-10-CM | POA: Diagnosis not present

## 2017-12-07 DIAGNOSIS — Z803 Family history of malignant neoplasm of breast: Secondary | ICD-10-CM | POA: Diagnosis not present

## 2017-12-07 LAB — CBC WITH DIFFERENTIAL/PLATELET
Basophils Absolute: 0.1 10*3/uL (ref 0–0.1)
Basophils Relative: 1 %
Eosinophils Absolute: 0 10*3/uL (ref 0–0.7)
Eosinophils Relative: 0 %
HCT: 21.6 % — ABNORMAL LOW (ref 40.0–52.0)
Hemoglobin: 7.3 g/dL — ABNORMAL LOW (ref 13.0–18.0)
Lymphocytes Relative: 15 %
Lymphs Abs: 1 10*3/uL (ref 1.0–3.6)
MCH: 32.4 pg (ref 26.0–34.0)
MCHC: 34 g/dL (ref 32.0–36.0)
MCV: 95.2 fL (ref 80.0–100.0)
Monocytes Absolute: 0.5 10*3/uL (ref 0.2–1.0)
Monocytes Relative: 7 %
Neutro Abs: 5.4 10*3/uL (ref 1.4–6.5)
Neutrophils Relative %: 77 %
Platelets: 26 10*3/uL — CL (ref 150–400)
RBC: 2.27 MIL/uL — ABNORMAL LOW (ref 4.40–5.90)
RDW: 16.7 % — ABNORMAL HIGH (ref 11.5–14.5)
WBC: 7.1 10*3/uL (ref 3.8–10.6)

## 2017-12-07 LAB — PATHOLOGIST SMEAR REVIEW

## 2017-12-07 LAB — COMPREHENSIVE METABOLIC PANEL
ALT: 12 U/L (ref 0–44)
AST: 12 U/L — ABNORMAL LOW (ref 15–41)
Albumin: 3.7 g/dL (ref 3.5–5.0)
Alkaline Phosphatase: 48 U/L (ref 38–126)
Anion gap: 8 (ref 5–15)
BUN: 19 mg/dL (ref 6–20)
CO2: 23 mmol/L (ref 22–32)
Calcium: 8.8 mg/dL — ABNORMAL LOW (ref 8.9–10.3)
Chloride: 99 mmol/L (ref 98–111)
Creatinine, Ser: 1 mg/dL (ref 0.61–1.24)
GFR calc Af Amer: >60 mL/min (ref >60)
GFR calc non Af Amer: >60 mL/min (ref >60)
Glucose, Bld: 106 mg/dL — ABNORMAL HIGH (ref 70–99)
Potassium: 4.3 mmol/L (ref 3.5–5.1)
Sodium: 130 mmol/L — ABNORMAL LOW (ref 135–145)
Total Bilirubin: 0.4 mg/dL (ref 0.3–1.2)
Total Protein: 6.4 g/dL — ABNORMAL LOW (ref 6.5–8.1)

## 2017-12-07 LAB — MAGNESIUM: Magnesium: 1.4 mg/dL — ABNORMAL LOW (ref 1.7–2.4)

## 2017-12-07 MED ORDER — GABAPENTIN 300 MG PO CAPS
300.0000 mg | ORAL_CAPSULE | Freq: Every day | ORAL | Status: DC
  Administered 2017-12-07 – 2017-12-10 (×4): 300 mg via ORAL
  Filled 2017-12-07 (×4): qty 1

## 2017-12-07 MED ORDER — POTASSIUM CHLORIDE CRYS ER 20 MEQ PO TBCR
20.0000 meq | EXTENDED_RELEASE_TABLET | Freq: Two times a day (BID) | ORAL | Status: DC
  Administered 2017-12-07 – 2017-12-11 (×8): 20 meq via ORAL
  Filled 2017-12-07 (×8): qty 1

## 2017-12-07 MED ORDER — IOPAMIDOL (ISOVUE-300) INJECTION 61%
100.0000 mL | Freq: Once | INTRAVENOUS | Status: AC | PRN
  Administered 2017-12-07: 100 mL via INTRAVENOUS

## 2017-12-07 MED ORDER — SODIUM CHLORIDE 0.9 % IV SOLN
INTRAVENOUS | Status: DC
  Administered 2017-12-07 – 2017-12-10 (×4): via INTRAVENOUS

## 2017-12-07 MED ORDER — SODIUM CHLORIDE 0.9 % IV SOLN
2.0000 g | Freq: Three times a day (TID) | INTRAVENOUS | Status: DC
  Administered 2017-12-07 – 2017-12-11 (×11): 2 g via INTRAVENOUS
  Filled 2017-12-07 (×13): qty 2

## 2017-12-07 MED ORDER — IOPAMIDOL (ISOVUE-300) INJECTION 61%
15.0000 mL | INTRAVENOUS | Status: AC
  Administered 2017-12-07 (×2): 15 mL via ORAL

## 2017-12-07 MED ORDER — ACETAMINOPHEN 650 MG RE SUPP: 650.0000 mg | Freq: Four times a day (QID) | RECTAL | Status: DC | PRN

## 2017-12-07 MED ORDER — OXYCODONE HCL 5 MG PO TABS
10.0000 mg | ORAL_TABLET | ORAL | Status: DC | PRN
  Administered 2017-12-08 – 2017-12-11 (×10): 10 mg via ORAL
  Filled 2017-12-07 (×10): qty 2

## 2017-12-07 MED ORDER — VANCOMYCIN HCL IN DEXTROSE 1-5 GM/200ML-% IV SOLN
1000.0000 mg | Freq: Once | INTRAVENOUS | Status: AC
  Administered 2017-12-07: 21:00:00 1000 mg via INTRAVENOUS
  Filled 2017-12-07: qty 200

## 2017-12-07 MED ORDER — NICOTINE 21 MG/24HR TD PT24
21.0000 mg | MEDICATED_PATCH | Freq: Every day | TRANSDERMAL | Status: DC
  Administered 2017-12-07 – 2017-12-10 (×4): 21 mg via TRANSDERMAL
  Filled 2017-12-07 (×5): qty 1

## 2017-12-07 MED ORDER — LORATADINE 10 MG PO TABS
10.0000 mg | ORAL_TABLET | Freq: Every day | ORAL | Status: DC
  Administered 2017-12-08 – 2017-12-11 (×4): 10 mg via ORAL
  Filled 2017-12-07 (×4): qty 1

## 2017-12-07 MED ORDER — ACETAMINOPHEN 325 MG PO TABS
650.0000 mg | ORAL_TABLET | Freq: Four times a day (QID) | ORAL | Status: DC | PRN
  Administered 2017-12-07 – 2017-12-09 (×4): 650 mg via ORAL
  Filled 2017-12-07 (×4): qty 2

## 2017-12-07 MED ORDER — MAGNESIUM OXIDE 400 (241.3 MG) MG PO TABS
400.0000 mg | ORAL_TABLET | Freq: Every day | ORAL | Status: DC
  Administered 2017-12-08 – 2017-12-11 (×4): 400 mg via ORAL
  Filled 2017-12-07 (×4): qty 1

## 2017-12-07 MED ORDER — MAGNESIUM SULFATE 2 GM/50ML IV SOLN: 2.0000 g | Freq: Once | INTRAVENOUS | Status: DC

## 2017-12-07 MED ORDER — OXYCODONE HCL 5 MG PO TABS
5.0000 mg | ORAL_TABLET | ORAL | Status: DC | PRN
  Administered 2017-12-07: 5 mg via ORAL
  Filled 2017-12-07: qty 1

## 2017-12-07 MED ORDER — OXYCODONE HCL 5 MG PO TABS
5.0000 mg | ORAL_TABLET | Freq: Once | ORAL | Status: AC
  Administered 2017-12-07: 23:00:00 5 mg via ORAL
  Filled 2017-12-07: qty 1

## 2017-12-07 MED ORDER — DULOXETINE HCL 30 MG PO CPEP
60.0000 mg | ORAL_CAPSULE | Freq: Every day | ORAL | Status: DC
  Administered 2017-12-08 – 2017-12-10 (×3): 60 mg via ORAL
  Filled 2017-12-07 (×3): qty 2

## 2017-12-07 MED ORDER — METRONIDAZOLE IN NACL 5-0.79 MG/ML-% IV SOLN
500.0000 mg | Freq: Three times a day (TID) | INTRAVENOUS | Status: DC
  Administered 2017-12-07 – 2017-12-09 (×6): 500 mg via INTRAVENOUS
  Filled 2017-12-07 (×10): qty 100

## 2017-12-07 MED ORDER — VANCOMYCIN HCL 10 G IV SOLR
1250.0000 mg | Freq: Three times a day (TID) | INTRAVENOUS | Status: DC
  Administered 2017-12-08 (×3): 1250 mg via INTRAVENOUS
  Filled 2017-12-07 (×5): qty 1250

## 2017-12-07 NOTE — Progress Notes (Signed)
Patient has fever of 103.1. Tylenol to be administered. MD notified. No new orders at this time

## 2017-12-07 NOTE — Progress Notes (Signed)
Patient states he is not taking Cymbalta.  Did not feel it helped him.  C/o left abdominal pain 4/10.  States his gums are sore.

## 2017-12-07 NOTE — Consult Note (Signed)
[facility] Consult Note  MRN : [id number]  Edgar [name] is a 27 y.o. (04/05/1990) male who presents with chief complaint of I had a fever when I was seen in the office today.  History of Present Illness:  The patient is 27 year old gentleman who is well-known to me with an extensive history of deep venous thrombosis discovered during his treatment for advanced testicular cancer.  As a secondary incident he did sustain a penetrating trauma to his right foot and there is continued question as to whether there is foreign material left which could possibly be a nidus for infection.  With respect to his symptoms associated with deep venous thrombosis this is been primarily painful swelling.  Overall this situation has been fairly stable.  His left lower extremity was the limb affected by the DVT.  Lately his right lower extremity has been as swollen if not more swollen.  He denies recent shortness of breath or pleuritic chest pain.  Approximately 4 weeks ago he did have a CT angiogram and there did not appear to be evidence supporting rethrombosis of his left lower extremity ileo-caval system.  His filter appears to be patent.  Duplex ultrasound of the lower extremities was negative for DVT.  Current Facility-Administered Medications  Medication Dose Route Frequency Provider Last Rate Last Dose  . 0.9 %  sodium chloride infusion   Intravenous Continuous Wieting, Richard, MD      . acetaminophen (TYLENOL) tablet 650 mg  650 mg Oral Q6H PRN [name], MD   650 mg at 12/07/17 2038   Or  . acetaminophen (TYLENOL) suppository 650 mg  650 mg Rectal Q6H PRN Wieting, Richard, MD      . ceFEPIme (MAXIPIME) 2 g in sodium chloride 0.9 % 100 mL IVPB  2 g Intravenous Q8H [name], [facility]      . [START ON 12/08/2017] DULoxetine (CYMBALTA) DR capsule 60 mg  60 mg Oral Daily Wieting, Richard, MD      . gabapentin (NEURONTIN) capsule 300 mg  300 mg Oral QHS  Wieting, Richard, MD      . iopamidol (ISOVUE-300) 61 % injection 15 mL  15 mL Oral Q1 Hr x 2 Wieting, Richard, MD      . Derrill Memo ON 12/08/2017] loratadine (CLARITIN) tablet 10 mg  10 mg Oral Daily [name], MD      . Derrill Memo ON 12/08/2017] magnesium oxide (MAG-OX) tablet 400 mg  400 mg Oral Daily Wieting, Richard, MD      . magnesium sulfate IVPB 2 g 50 mL  2 g Intravenous Once Wieting, Richard, MD      . metroNIDAZOLE (FLAGYL) IVPB 500 mg  500 mg Intravenous Q8H [name], [name], MD      . nicotine (NICODERM CQ - dosed in mg/24 hours) patch 21 mg  21 mg Transdermal Daily [name], MD   21 mg at 12/07/17 1842  . oxyCODONE (Oxy IR/ROXICODONE) immediate release tablet 5 mg  5 mg Oral Q4H PRN [name], MD   5 mg at 12/07/17 2038  . potassium chloride SA (K-DUR,KLOR-CON) CR tablet 20 mEq  20 mEq Oral BID [name], MD      . Derrill Memo ON 12/08/2017] vancomycin (VANCOCIN) 1,250 mg in sodium chloride 0.9 % 250 mL IVPB  1,250 mg Intravenous Q8H Cyndee Brightly [place], [facility]      . vancomycin (VANCOCIN) IVPB 1000 mg/200 mL premix  1,000 mg Intravenous Once Paticia Stack, RPH 200 mL/hr  at 12/07/17 2036 1,000 mg at 12/07/17 2036   Facility-Administered Medications Ordered in Other Encounters  Medication Dose Route Frequency Provider Last Rate Last Dose  . heparin lock flush 100 unit/mL  500 Units Intravenous Once Corcoran, [name], MD      . heparin lock flush 100 unit/mL  500 Units Intravenous Once Corcoran, [name], MD      . sodium chloride flush (NS) 0.9 % injection 10 mL  10 mL Intravenous PRN [name], MD   10 mL at 10/21/17 1008    Past Medical History:  Diagnosis Date  . Asthma    AS A CHILD-[name] INHALERS  . Cancer ([place])    testicular cancer 11/2016 per pt   . DVT (deep venous thrombosis) ([place])   . GERD (gastroesophageal reflux disease)    TUMS PRN    Past Surgical History:  Procedure Laterality Date  . ANKLE FRACTURE SURGERY Right 2004  . FINE  NEEDLE ASPIRATION BIOPSY N/A 11/09/2017   Procedure: FINE NEEDLE ASPIRATION BIOPSY;  Surgeon: [name], MD;  Location: [facility];  Service: Cardiovascular;  Laterality: N/A;  . IVC FILTER INSERTION    . ORCHIECTOMY Left 11/26/2015   Procedure: ORCHIECTOMY;  Surgeon: [name], MD;  Location: [facility];  Service: Urology;  Laterality: Left;  radical/Inguinal approach  . PERIPHERAL VASCULAR CATHETERIZATION N/A 12/16/2015   Procedure: [name] Cath Insertion;  Surgeon: [name], MD;  Location: [place] [facility];  Service: Cardiovascular;  Laterality: N/A;  . PERIPHERAL VASCULAR THROMBECTOMY Left 10/13/2017   Procedure: PERIPHERAL VASCULAR THROMBECTOMY;  Surgeon: [name], MD;  Location: [place] [facility];  Service: Cardiovascular;  Laterality: Left;  . WISDOM TOOTH EXTRACTION  2017    Social History Social History   Tobacco Use  . Smoking status: Current Every Day Smoker    Packs/day: 0.50    Years: 8.00    Pack years: 4.00    Types: Cigarettes  . Smokeless tobacco: Former Systems developer    Types: Snuff  Substance Use Topics  . Alcohol use: [name]  . Drug use: Not Currently    Types: Marijuana    Family History Family History  Problem Relation Age of Onset  . Skin cancer Mother   . Breast cancer Maternal Grandmother   . Colon cancer Maternal Grandfather   . Diabetes Maternal Grandfather   . Emphysema Father   . Mental illness Sister        anxiety  . Stroke Paternal Grandmother   . Prostate cancer Neg Hx   . Kidney cancer Neg Hx   . Bladder Cancer Neg Hx     [name] Known Allergies   REVIEW OF SYSTEMS (Negative unless checked)  Constitutional: [] Weight loss  [] Fever  [] Chills Cardiac: [] Chest pain   [] Chest pressure   [] Palpitations   [] Shortness of breath when laying flat   [] Shortness of breath at rest   [] Shortness of breath with exertion. Vascular:  [] Pain in legs with walking   [] Pain in legs at rest   [] Pain in legs when laying flat    [] Claudication   [] Pain in feet when walking  [] Pain in feet at rest  [] Pain in feet when laying flat   [x] History of DVT   [] Phlebitis   [x] Swelling in legs   [] Varicose veins   [] Non-healing ulcers Pulmonary:   [] Uses home oxygen   [] Productive cough   [] Hemoptysis   [] Wheeze  [] COPD   [] Asthma Neurologic:  [] Dizziness  [] Blackouts   []   Seizures   [] History of stroke   [] History of TIA  [] Aphasia   [] Temporary blindness   [] Dysphagia   [] Weakness or numbness in arms   [] Weakness or numbness in legs Musculoskeletal:  [] Arthritis   [] Joint swelling   [] Joint pain   [] Low back pain Hematologic:  [] Easy bruising  [] Easy bleeding   [] Hypercoagulable state   [] Anemic  [] Hepatitis Gastrointestinal:  [] Blood in stool   [] Vomiting blood  [] Gastroesophageal reflux/heartburn   [] Difficulty swallowing. Genitourinary:  [] Chronic kidney disease   [] Difficult urination  [] Frequent urination  [] Burning with urination   [] Blood in urine Skin:  [] Rashes   [] Ulcers   [] Wounds Psychological:  [] History of anxiety   []  History of major depression.   Physical Examination  Vitals:   12/07/17 1940 12/07/17 2038  BP: (!) 103/56   Pulse: (!) 111   Resp: (!) 22   Temp: (!) 103.1 F (39.5 C) (!) 101.8 F (38.8 C)  TempSrc: Oral Axillary  SpO2: 100%    There is [name] height or weight on file to calculate BMI.  Head: [place]/AT, [name] temporalis wasting. Prominent temp pulse not noted. Ear/Nose/Throat: Nares w/o erythema or drainage, oropharynx w/o obsrtuction Eyes: PERRLA, Sclera nonicteric.  Neck: Supple, [name] nuchal rigidity.  [name] bruit or JVD.  Pulmonary:  Breath sounds equal bilaterally, [name] use of accessory muscles.  Cardiac: RRR, normal S1, S2, [name] Murmurs, rubs or gallops. Vascular: Bilateral lower extremity edema 3-4+ pitting [name] ulcers noted [name] drainage Gastrointestinal: soft, non-tender, non-distended.  Musculoskeletal: Moves all extremities.  [name] deformity or atrophy. [name] edema. Neurologic: CN 2-12 intact.  Symmetrical.  Speech is fluent.  Psychiatric: Judgment intact, Mood & affect appropriate for pt's clinical situation. Dermatologic: [name] rashes or ulcers noted.  [name] cellulitis or open wounds. Lymph : [name] Cervical,  or Inguinal lymphadenopathy.      CBC Lab Results  Component Value Date   WBC 7.1 12/07/2017   HGB 7.3 (L) 12/07/2017   HCT 21.6 (L) 12/07/2017   MCV 95.2 12/07/2017   PLT 26 (LL) 12/07/2017    BMET    Component Value Date/Time   NA 130 (L) 12/07/2017 1144   K 4.3 12/07/2017 1144   CL 99 12/07/2017 1144   CO2 23 12/07/2017 1144   GLUCOSE 106 (H) 12/07/2017 1144   BUN 19 12/07/2017 1144   CREATININE 1.00 12/07/2017 1144   CALCIUM 8.8 (L) 12/07/2017 1144   GFRNONAA >60 12/07/2017 1144   GFRAA >60 12/07/2017 1144   Estimated Creatinine Clearance: 154.9 mL/min (by C-G formula based on SCr of 1 mg/dL).  COAG Lab Results  Component Value Date   INR 1.13 11/07/2017   INR 1.65 11/06/2017   INR 1.18 11/05/2017     Assessment/Plan 1.  DVT left lower extremity with bilateral lower extremity edema:  At this time I do not see clinical evidence of ileal caval thrombosis or problems with his IVC filter.  He has been taken off his Xarelto given his low platelet count but he does have a filter in place to prevent lethal pulmonary embolism.  [name] vascular interventions or surgeries at this time.  I would continue to follow.  He does have a CT scan with contrast pending at the time of my evaluation.  I would continue to aggressively evaluate the possibility of infection particularly of the right foot in a significantly immunocompromised patient.   Hortencia Pilar, MD  12/07/2017 9:18 PM

## 2017-12-07 NOTE — Progress Notes (Signed)
[facility] day:  12/07/2017  Chief Complaint: Edgar [name] is a 27 y.o. male with recurrent testicular cancer who is seen for assessment on day 16 of cycle #3 TIP chemotherapy.  HPI: The patient was last seen in the medical oncology clinic on 12/02/2017.  At that time, he remained fatigued following treatment. He had yet to return to his pre-treatment PLOF. He had shortness of breath with exertion. He was pale in clinic.  He denied any B symptoms. He denied any interval infections.   Exam revealed improving edema to BILATERAL lower extremities and to RIGHT foot. There was a puncture wound to the dorsal aspect of the RIGHT foot that exhibited no evidence of infection. WBC was 700 (ANC 0). Hemoglobin was 7.8, hematocrit 22.7, and platelets 56,000. Potassium was 3.4.   Counts have been followed: 12/02/2017:  Hematocrit 22.7, hemoglobin 7.8, MCV 95.1, platelets 56,000, WBC 700 with an ANC of 0. 12/03/2017:  Hematocrit 22.5, hemoglobin 7.8, MCV 94.6, platelets 33,000, WBC 400 with an ANC of 0.  He received 1 unit PRBCs.  Xarelto held. 12/06/2017:  Hematocrit 25.2, hemoglobin 8.6, MCV 94.4, platelets 22,000, WBC 6800 with an ANC of 5600.  He was seen by Dr. Cleda Mccreedy in podiatry on 12/02/2017.  Notes reviewed.  He noted pain in the forefoot.  There was edema in the right foot and lower leg without focal erythema or signs of abscess.  Plain films revealed a normal joint space pattern throughout the foot with no clear signs of any cortical erosions or signs of osteomyelitis. There was a couple of small minute radiopaque densities in the first interspace on the oblique view.  Ongoing observation was recommended.  During the interim, he has been fatigued. He notes gradual increase in lower extremity swelling since discontinuation of Xarelto,  About 2 days ago, he noted "red dots" on his legs.  He denies any excess bruising or bleeding.  He states that his right  foot has "good and bad days".  On bad days, his foot hurts more.  He still can not bend his toes.  He notes that his mouth and gums hurt over the weekend.  Today they are feeling better.    He denies any shortness of breath or cough.  He denies any urinary symptoms.  He notes soreness "like I pulled a muscle" in his left lower quadrant.  He denies any nausea, vomiting, diarrhea, melena or hematochezia.    He has a fever (102.2) in clinic today.  He is tachycardic (130).  Blood pressure is 121/60.   Past Medical History:  Diagnosis Date  . Asthma    AS A CHILD-NO INHALERS  . Cancer ([place])    testicular cancer 11/2016 per pt   . DVT (deep venous thrombosis) ([place])   . GERD (gastroesophageal reflux disease)    TUMS PRN    Past Surgical History:  Procedure Laterality Date  . ANKLE FRACTURE SURGERY Right 2004  . FINE NEEDLE ASPIRATION BIOPSY N/A 11/09/2017   Procedure: FINE NEEDLE ASPIRATION BIOPSY;  Surgeon: [name], MD;  Location: [facility];  Service: Cardiovascular;  Laterality: N/A;  . ORCHIECTOMY Left 11/26/2015   Procedure: ORCHIECTOMY;  Surgeon: [name], MD;  Location: [facility];  Service: Urology;  Laterality: Left;  radical/Inguinal approach  . PERIPHERAL VASCULAR CATHETERIZATION N/A 12/16/2015   Procedure: [name] Cath Insertion;  Surgeon: [name], MD;  Location: [facility];  Service: Cardiovascular;  Laterality: N/A;  . PERIPHERAL VASCULAR THROMBECTOMY Left 10/13/2017   Procedure: PERIPHERAL VASCULAR THROMBECTOMY;  Surgeon: [name], MD;  Location: [facility];  Service: Cardiovascular;  Laterality: Left;  . WISDOM TOOTH EXTRACTION  2017    Family History  Problem Relation Age of Onset  . Skin cancer Mother   . Breast cancer Maternal Grandmother   . Colon cancer Maternal Grandfather   . Diabetes Maternal Grandfather   . Emphysema Father   . Mental illness Sister        anxiety  . Stroke Paternal Grandmother   .  Prostate cancer Neg Hx   . Kidney cancer Neg Hx   . Bladder Cancer Neg Hx     Social History:  reports that he has been smoking cigarettes. He has a 4.00 pack-year smoking history. He has quit using smokeless tobacco.  His smokeless tobacco use included snuff. He reports that he has current or past drug history. Drug: Marijuana. He reports that he does not drink alcohol.  He smokes 1/2 pack a day.  He smokes marijuana.  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  He has a 24 year-old-daughter named Edgar [name]. The patient's mother's cell phone number is [phone].  Another contact number is [phone].  The patient lives in [place]. He is alone today.   Allergies: No Known Allergies  Current Medications: Current Outpatient Medications  Medication Sig Dispense Refill  . acetaminophen (TYLENOL) 500 MG tablet Take 500-1,000 mg by mouth every 6 (six) hours as needed for mild pain or fever.     Marland Kitchen amoxicillin (AMOXIL) 500 MG tablet Take 1 tablet (500 mg total) by mouth every 8 (eight) hours. 30 tablet 0  . dexamethasone (DECADRON) 4 MG tablet Take 2 tablets (8 mg total) by mouth daily. Take 8 mg by mouth once daily for three days after chemo to prevent nausea 60 tablet 1  . gabapentin (NEURONTIN) 300 MG capsule Take 1 capsule (300 mg total) by mouth at bedtime. 30 capsule 2  . loratadine (CLARITIN) 10 MG tablet Take 1 tablet (10 mg total) by mouth daily. 90 tablet 1  . magnesium oxide (MAG-OX) 400 MG tablet Take 1 tablet by mouth daily.    . nicotine (NICODERM CQ - DOSED IN MG/24 HOURS) 21 mg/24hr patch Place 21 mg onto the skin daily.     . potassium chloride SA (K-DUR,KLOR-CON) 20 MEQ tablet Take 1 tablet (20 mEq total) by mouth 2 (two) times daily. 60 tablet 1  . DULoxetine (CYMBALTA) 60 MG capsule Take 1 tablet by mouth daily.    Alveda Reasons 20 MG TABS tablet Take 1 tablet by mouth daily.     No current facility-administered medications for this visit.    Facility-Administered  Medications Ordered in Other Visits  Medication Dose Route Frequency Provider Last Rate Last Dose  . heparin lock flush 100 unit/mL  500 Units Intravenous Once Corcoran, [name], MD      . heparin lock flush 100 unit/mL  500 Units Intravenous Once Corcoran, [name], MD      . sodium chloride flush (NS) 0.9 % injection 10 mL  10 mL Intravenous PRN Nolon Stalls C, MD   10 mL at 10/21/17 1008    Review of Systems  Constitutional: Positive for fever (today), malaise/fatigue and weight loss (10 pound weight gain in 5 days). Negative for chills and diaphoresis.       Fatigue.  HENT: Negative for congestion, ear  discharge, ear pain, hearing loss, nosebleeds, sinus pain, sore throat and tinnitus.        Mouth and gum pain, improving.  Right ear scrape.  Eyes: Negative.  Negative for blurred vision, double vision, photophobia, pain and discharge.  Respiratory: Negative for cough, hemoptysis, sputum production, shortness of breath and wheezing.   Cardiovascular: Positive for leg swelling (gradual; bilateral over 4 days). Negative for chest pain, palpitations, orthopnea and claudication.  Gastrointestinal: Positive for abdominal pain (Left lower quadrant). Negative for blood in stool, constipation, diarrhea, heartburn, melena, nausea and vomiting.  Genitourinary: Negative.  Negative for dysuria, flank pain, frequency, hematuria and urgency.  Musculoskeletal: Negative for back pain, falls, joint pain, myalgias and neck pain.       Right foot pain (plantar surface) off/on s/p nail puncture injury.  Skin: Negative for itching.       Red dots on legs; No new skin lesions.  Left calf lesion stable to slightly worse.  Neurological: Positive for weakness (general). Negative for dizziness, tingling, tremors, sensory change, speech change, focal weakness, loss of consciousness and headaches (occasional).  Endo/Heme/Allergies: Negative.  Does not bruise/bleed easily.  Psychiatric/Behavioral: Negative for  depression and memory loss. The patient is not nervous/anxious and does not have insomnia.    Performance status (ECOG): 1-2  Vital Signs BP 121/60 (BP Location: Left Arm, Patient Position: Sitting)   Pulse (!) 130   Temp (!) 102.2 F (39 C) (Tympanic) Comment: Oral - 100.6 (Patient has been drinking coffee)  Resp 18   Wt 274 lb 6 oz (124.5 kg)   BMI 34.29 kg/m   Physical Exam  Constitutional: He is oriented to person, place, and time and well-developed, well-nourished, and in no distress.  Fatigued appearing.  HENT:  Head: Atraumatic.  Left Ear: External ear normal.  Nose: Nose normal.  Mouth/Throat: Oropharynx is clear and moist. Abnormal dentition: poor dentition. No oropharyngeal exudate.  Alopecia.  Poor dentition.  s/p right lower molar extraction.  No pain on dental percussion.  Right external ear eschar without surrounding erythema.  Eyes: Pupils are equal, round, and reactive to light. Conjunctivae and EOM are normal. Right eye exhibits no discharge. Left eye exhibits no discharge. No scleral icterus.  Brown eyes.  Neck: Normal range of motion. Neck supple. No JVD present.  Cardiovascular: Normal rate, regular rhythm and normal heart sounds. Exam reveals no gallop.  No murmur (soft flow murmur) heard. Tachycardia.  Pulmonary/Chest: Effort normal and breath sounds normal. No respiratory distress. He has no wheezes. He has no rales.  Abdominal: Soft. Bowel sounds are normal. He exhibits no distension and no mass. There is tenderness (LLQ without guarding or rebound tenderness.).  Musculoskeletal: Normal range of motion. He exhibits edema (Marked bilateral lower extremity edema (R > L).). He exhibits no tenderness or deformity.       Right ankle: He exhibits swelling. Tenderness: Dorsal aspect of RIGHT foot 2/2 puncture wound.  Lymphadenopathy:    He has no cervical adenopathy.    He has no axillary adenopathy.       Right: No supraclavicular adenopathy present.       Left:  No supraclavicular adenopathy present.  Neurological: He is alert and oriented to person, place, and time. Gait normal.  Skin: Skin is warm and dry. Lesion (Circular lesions to LEFT inner thigh and left calf healing with no s/s of infection. ) noted. There is pallor.  Numerous petechial like lesion lower extremities.  Circular lesion left thigh stable.  Left calf  lesion moist.  No evidence of SBE.  Psychiatric: Mood, affect and judgment normal.  Nursing note and vitals reviewed.  Imaging studies: 11/28/2015:  Chest, abdomen, and pelvic CT revealed no mediastinal adenopathy or suspicious pulmonary nodules.   There were 2 prominent left periaortic retroperitoneal lymph nodes (1.7 cm and 0.9 cm) concerning for testicular nodal metastasis.  There were no abnormal lymph nodes above the renal veins.  There were postsurgical change in the left hemiscrotum and left inguinal canal.  There was a 3.3 cm soft tissue abnormality anterior to the left iliopsoas muscle which could represent a metastatic lymph node (N2 lesion).  12/17/2015:  Head MRI revealed no evidence of metastatic disease.   03/30/2016:  Chest, abdomen, and pelvic CT revealed interval resolution of left abdominal/pelvic lymphadenopathy.  There was no residual metastatic disease. 09/30/2016:  Chest, abdomen, and pelvic CT revealed no evidence of recurrent or metastatic disease.  09/10/2017:  Bilateral lower extremity duplex revealed evidence of obstruction in the left CFV, SFJ, and proximal femoral vein.  09/10/2017:  CT pelvic venogram revealed multiple retroperitoneal masses including large mass within the left iliacus concerning for metastatic disease in the setting of prior testicular cancer.  Mass in the left iliacus caused significant narrowing of the proximal left external iliac vein. There was filling defect within the distal left external iliac vein extending into the common femoral and proximal superficial femoral veins consistent with  thrombus  09/11/2017:  Chest, abdomen, and pelvic CT revealed enlarged and morphologically abnormal 4.3 x 4.9 cm left pelvic sidewall and retroperitoneal lymph nodes (measuring up to 2.9 cm) concerning for metastatic disease given history of testicular cancer. Biopsy was suggested for confirmation. Prominent left inguinal lymph nodes could be metastatic or reactive in nature.  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  There was anasarca/swelling of the left lower extremity.  There was no evidence of metastatic disease in the chest. 09/13/2017:  Head MRI revealed no evidence of metastatic disease. 11/17/2017:  Right foot MRI revealed artifact in the plantar soft tissues over the first metatarsal head likely representing metallic foreign bodies as seen on previous radiographs.  There was an area of increased T2 signal intensity in the plantar aspect of the first metatarsal head with focal enhancement may indicate an area of focal osteomyelitis.  There was no soft tissue abscess.   Infusion on 12/07/2017  Component Date Value Ref Range Status  . Magnesium 12/07/2017 1.4* 1.7 - 2.4 mg/dL Final   Performed at [facility], [address] [place], [place] [address]  . Sodium 12/07/2017 130* 135 - 145 mmol/L Final  . Potassium 12/07/2017 4.3  3.5 - 5.1 mmol/L Final  . Chloride 12/07/2017 99  98 - 111 mmol/L Final  . CO2 12/07/2017 23  22 - 32 mmol/L Final  . Glucose, Bld 12/07/2017 106* 70 - 99 mg/dL Final  . BUN 12/07/2017 19  6 - 20 mg/dL Final  . Creatinine, Ser 12/07/2017 1.00  0.61 - 1.24 mg/dL Final  . Calcium 12/07/2017 8.8* 8.9 - 10.3 mg/dL Final  . Total Protein 12/07/2017 6.4* 6.5 - 8.1 g/dL Final  . Albumin 12/07/2017 3.7  3.5 - 5.0 g/dL Final  . AST 12/07/2017 12* 15 - 41 U/L Final  . ALT 12/07/2017 12  0 - 44 U/L Final  . Alkaline Phosphatase 12/07/2017 48  38 - 126 U/L Final  . Total Bilirubin 12/07/2017 0.4   0.3 - 1.2 mg/dL  Final  . GFR calc non Af Amer 12/07/2017 >60  >60 mL/min Final  . GFR calc Af Amer 12/07/2017 >60  >60 mL/min Final   Comment: (NOTE) The eGFR has been calculated using the CKD EPI equation. This calculation has not been validated in all clinical situations. eGFR's persistently <60 mL/min signify possible Chronic Kidney Disease.   Georgiann Hahn gap 12/07/2017 8  5 - 15 Final   Performed at [facility], [address] [place], [place] [address]  . WBC 12/07/2017 7.1  3.8 - 10.6 K/uL Final  . RBC 12/07/2017 2.27* 4.40 - 5.90 MIL/uL Final  . Hemoglobin 12/07/2017 7.3* 13.0 - 18.0 g/dL Final  . HCT 12/07/2017 21.6* 40.0 - 52.0 % Final  . MCV 12/07/2017 95.2  80.0 - 100.0 fL Final  . MCH 12/07/2017 32.4  26.0 - 34.0 pg Final  . MCHC 12/07/2017 34.0  32.0 - 36.0 g/dL Final  . RDW 12/07/2017 16.7* 11.5 - 14.5 % Final  . Platelets 12/07/2017 26* 150 - 400 K/uL Final   Comment: RESULT REPEATED AND VERIFIED CRITICAL RESULT CALLED TO, READ BACK BY AND VERIFIED WITH: BRYAN GRAY AT 12:26 12/07/2017 BY LGR   . Neutrophils Relative % 12/07/2017 77  % Final  . Neutro Abs 12/07/2017 5.4  1.4 - 6.5 K/uL Final  . Lymphocytes Relative 12/07/2017 15  % Final  . Lymphs Abs 12/07/2017 1.0  1.0 - 3.6 K/uL Final  . Monocytes Relative 12/07/2017 7  % Final  . Monocytes Absolute 12/07/2017 0.5  0.2 - 1.0 K/uL Final  . Eosinophils Relative 12/07/2017 0  % Final  . Eosinophils Absolute 12/07/2017 0.0  0 - 0.7 K/uL Final  . Basophils Relative 12/07/2017 1  % Final  . Basophils Absolute 12/07/2017 0.1  0 - 0.1 K/uL Final   Performed at [facility], [address] [place], [place] [address]  Appointment on 12/06/2017  Component Date Value Ref Range Status  . WBC 12/06/2017 6.8  3.8 - 10.6 K/uL Final  . RBC 12/06/2017 2.67* 4.40 - 5.90 MIL/uL Final  . Hemoglobin 12/06/2017 8.6* 13.0 - 18.0 g/dL Final  . HCT 12/06/2017 25.2* 40.0 - 52.0 % Final  . MCV 12/06/2017 94.4  80.0 - 100.0  fL Final  . MCH 12/06/2017 32.3  26.0 - 34.0 pg Final  . MCHC 12/06/2017 34.2  32.0 - 36.0 g/dL Final  . RDW 12/06/2017 16.6* 11.5 - 14.5 % Final  . Platelets 12/06/2017 22* 150 - 400 K/uL Final   Comment: RESULT REPEATED AND VERIFIED PLATELET COUNT CONFIRMED BY SMEAR CRITICAL RESULT CALLED TO, READ BACK BY AND VERIFIED WITH: BRYAN GRAY @ 12:02PM 12/06/2017 BY LGR   . Neutrophils Relative % 12/06/2017 83  % Final  . Neutro Abs 12/06/2017 5.6  1.4 - 6.5 K/uL Final  . Lymphocytes Relative 12/06/2017 11  % Final  . Lymphs Abs 12/06/2017 0.7* 1.0 - 3.6 K/uL Final  . Monocytes Relative 12/06/2017 6  % Final  . Monocytes Absolute 12/06/2017 0.4  0.2 - 1.0 K/uL Final  . Eosinophils Relative 12/06/2017 0  % Final  . Eosinophils Absolute 12/06/2017 0.0  0 - 0.7 K/uL Final  . Basophils Relative 12/06/2017 0  % Final  . Basophils Absolute 12/06/2017 0.0  0 - 0.1 K/uL Final   Performed at [facility], [address], [place], [place] [address]    Assessment:  BERNAL LUHMAN is a 27 y.o. male with recurrent testicular cancer.  He presented with  a left lower extremity DVT on 09/10/2017.  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  Clinical/pathologic stage is T1 N1-2 S0 M0.  He received 3 cycles of  BEP (12/30/2015 - 02/24/2016).  He received OnPro Neulasta with cycle #2 and #3.  He declined evaluation for retroperitoneal lymph node dissection (RPLND).  He declined sperm banking.  Pre surgery labs on 11/20/2015 revealed an AFP of 411.9, beta-HCG 2,669, and LDH 522 (121-224).    Tumor markers have been followed:   AFP was 411.9 (0-8.3) on 11/20/2015, 11.2 on 12/19/2015, 6.3 on 12/23/2015, 1.3 on 01/27/2016, 1.8 on 02/17/2016, 1.9 on 03/30/2016, 1.4 on 05/27/2016, 1.0 on  07/29/2016, 1.4 on 10/14/2016, 3.3 on 12/24/2016, 1 on 09/10/2017, 1 on 09/20/2017,  2.6 on 10/21/2017, and 2.1 on 11/16/2017.    Beta-HCG was 2,669 (0-3) on 11/20/2015, 2 on 12/19/2015, 1 on 12/23/2015, < 1 on 01/27/2016, < 1 on 02/17/2016, < 1 on 03/30/2016, < 1 on 05/27/2016,  < 1 on 07/29/2016, < 1 on 10/14/2016, and 3 on 12/24/2016, < 5 on 09/11/2017,  < 5 on 09/20/2017, < 1 on 10/21/2017, and < 1 on 11/16/2017.  LDH was 522 (98-192) on 11/20/2015, 179 on 12/19/2015, 160 on 12/23/2015, 202 on 01/27/2016, 156 on 02/10/2016, 161 on 02/17/2016, 172 on 03/30/2016, 178 on 05/27/2016, 150 on 07/29/2016, 152 on 10/14/2016, 145 on 12/24/2016, 1557 on 09/11/2017, 916 on 09/20/2017, 144 on 10/21/2017, and 158 on 11/16/2017.  Chest, abdomen, and pelvic CT on 09/11/2017 revealed enlarged and morphologically abnormal 4.3 x 4.9 cm left pelvic sidewall and retroperitoneal lymph nodes (measuring up to 2.9 cm).  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  There was anasarca/swelling of the left lower extremity.  There was no evidence of metastatic disease in the chest.  Head MRI on 09/13/2017 revealed no evidence of metastatic disease.   He has a left lower extremity DVT.  Bilateral lower extremity duplex on 09/10/2017 revealed evidence of obstruction in the left CFV, SFJ, and proximal femoral vein.  He is on Xarelto.  He is currently day 16 s/p cycle #3 TIP chemotherapy (09/21/2017 - 11/27/2017) with Margarette Canada support. Cycle #1 was administered at [facility]. Cycles #2 and 3 were administered at [facility].  Both cycles 1 and 2 were complicated by lower extremity skin lesions on recovery of counts.  Audiogram on 10/27/2017 revealed hearing within normal limits.  He was diagnosed with cellulitis of the left lower extremity on 10/06/2017.  He was on clindamycin.  While hospitalized (10/08/2017 - 10/16/2017), he received 9 days of vancomycin and Cefepime.  He  was discharged on Flagyl, doxycycline, and Levaquin.  He was admitted to [facility] from 10/08/2017 - 10/16/2017 with left lower extremity cellulitis.  He was treated with Cefepime and vancomycin.  Imaging revealed significant improvement in adenopathy with decreased pelvic sidewall disease and decreased bulk at aortic bifurcation.  He was felt to have venous congestion syndrome with skin lesions due to congestion.   He was admitted to [facility] oncology floor from 10/21/2017 - 10/26/2017 for inpatient chemotherapy (cycle #2 TIP).    He was admitted to [facility] from 11/05/2017 - 11/09/2017 for cellulitis.  He was developing similar lower extremity skin lesions following his first cycle of chemotherapy.  Skin biopsy revealed diffuse neutrophilic  dermatitis. Cultures were negative.  Xarelto was held secondary to thrombocytopenia (nadir platelet count 39,000).  He was treated with heparin.  Lower extremity duplex revealed no clot.  He was treated with Cefepime and vancomycin.  He was discharged on Augmentin.  He received oral acyclovir for herpetic labialis.  He was admitted to [facility] oncology floor from 11/22/2017 - 11/27/2017 for inpatient chemotherapy (cycle #3 TIP).    He underwent IVC filterplacement, percutaneous angioplastyand stent placementof the left common iliac vein and left external iliac vein on 10/13/2017.  He was discharged on Flagyl, doxycycline, and Levaquin.  Hypercoagulable work-up on 10/12/2017 negative for the following: Factor V Leiden, prothrombin gene mutation, lupus anticoagulant, anticardiolipin antibodies, beta-2 glycoprotein, protein C activity/antigen, protein S activity/antigen. ATIII was 54% (75-120) on heparin.  Right foot MRI on 11/17/2017 revealed artifact in the plantar soft tissues over the first metatarsal head likely representing metallic foreign bodies as seen on previous radiographs.  There was an area of increased T2 signal intensity in the plantar aspect of the first  metatarsal head with focal enhancement may indicate an area of focal osteomyelitis.  There was no soft tissue abscess.  He has a history of right lower molar abscess s/p extraction on 11/19/2017.  He was treated with amoxicillin.  Symptomatically, he is fatigued.  He is febrile and tachycardic on amoxicillin. WBC has recovered.  He is thrombocytopenic.  He has bilateral lower extremity edema off Xarelto since thrombocytopenia.  Plan: 1. Labs today: CBC with diff, CMP, Mg. 2. Recurrent testicular cancer: Day 16 s/p cycle #3 TIP chemotherapy. 3. Chemotherapy induced pancytopenia: Neutropenia: Resolved.  ANC  5400 Anemia: Hemoglobin 7.3 and hematocrit 21.6. Will add type and screen to patient's labs for likely transfusion when fever down. Thrombocytopenia:  Platelets  26,000 without bleeding, but petechiae.  Xarelto currently on hold.  Peripheral smear for path review. 4. Fever and tachycardia:  Concern for sepsis.  Similar pattern of fever after recovery of counts with 2 prior cycles.  Admit to hospital for fever work-up:   Blood cultures x 2 and every 24 hours prn temp >= 100.4.   UA and culture.   CXR.(PA and lateral)   Abdomen and pelvic CT scan secondary to LLQ pain.  ID Consult  Anticipate discontinuation of amoxicillin and initiation of broad spectrum antibiotics. 5. Cellulitis and skin lesions: No evidence of skin lesions as with prior cycles.  Continue to monitor closely as hemorrhagic bullous lesions have occurred between day 14-16 post chemotherapy. 6. LEFT lower extremity DVT: Patient off Xarelto since 12/03/2017 when platelets were 33,000.  Currently 26,000. Concern for dramatic increase in lower extremity edema and LLQ pain where patient has known stent. Bilateral lower extremity duplex Abdomen and pelvic CT scan Patient well known to Dr. Delana Meyer. 7. RIGHT foot wound (nail puncture) Remains tender off/on. Patient followed by Dr. Cleda Mccreedy in podiatry. No obvious infection  although earlier concerns for possible osteomyelitis. 8. Electrolyte wasting: Magnesium 1.4. IV magnesium replacement. Continue outpatient potassium and magnesium. Sodium low. 9. Pain and symptom management Pain well controlled on oxycodone 10 mg po q 8 hrs prn. 10.  Admit to 1C to hospitalist service.  Spoke with Dr [name].   [name], MD  12/07/2017, 1:56 PM

## 2017-12-07 NOTE — Consult Note (Signed)
Date of Admission:  12/07/2017                 Reason for Consult: Fever   Referring Provider: Mike Gip   HPI: Edgar [name] is a 27 y.o. male with testicular cancer is admitted with fever from [place] office Pt took his  chemo 11/22/17-11/26/17 and was DC on 9/14. He went to his Onc office on 9/16 with pain rt foot at the site of prior injury sustained by FB piercing his skin . He was given pegfilgrastim-cbqv on 11/29/17 He was seen by podiatrist on 9/19 as well and no infection noted . Xray had minute radioopaque densities. Was asked to keep an eye on the foot. He had gone back to his Oncologist office today and noted to have fever of 102. Pt did not feel any temp. He has left lower quadrant pain for some time worse since xarelto was stopped as he had low platelets  He has a complicated medical History   h/o testicular cancer diagnosed in sept  2017 s/p left orchidectomys/p 3 cycles of BEP chemo , recurrence in June 2019, started TIP chemo, July had DVT of the left leg with severe venous edema , blistering lesions on his left leg,and underwent percutaneous transluminal angioplasty and mechanical thrombectomy of theleft common femoral, external iliac and common iliac veins and  stent placement of the left common iliac vein and left external iliac vein. Left leg cellulitis and lesions on his left thigh In Aug after his cycle of chemo he was admitted with fever and skin lesions and underwent biopsy which showed neutrophilic dermatosis Was treated with IV antibiotics and went on Po amoxicillin  Medical history  Diagnosed testicular cancer  in sept  2017 s/p left orchidectomys/p 3 cycles of BEP chemoPt on  June 28th  went to [facility] with swelling of left leg and workup  revealed retroperitoneal adenopathy causing compression of the left iliac vein and associated thrombus He got IV heparin and discharged on xarelto. He started first cycle of TIP (paclitaxel/ifosfamide/cisplatin)  on  09/21/17- a 5 day cycle every 21 days. He took Decadron x 3 days after chemo. On day 7 (09/27/2017), he began Levaquin x 7 days. He received Udencya on 09/27/2017. He was seen as follow up on 7/22 at [facility] and the note says he had marked swelling of the left leg with pitting edema. On 7/24 patient presented to [facility] ED with fever of 102 and pain left leg, along with worsening swelling. He was diagnosed with cellulitis and sent home on clindamycin. Blood culture was negative. He saw Dr.Corcoran ( onc) on July 26th and she noted blistering lesions on his left leg and admitted him to the hopsital for Iv antibiotics  He received IV vanco and cefepime. On 10/13/17 he underwent the following by Vascular surgery 1. Ultrasound guidance for vascular access to therightcommon femoral vein and leftpopliteal vein 2. Catheter placement into the inferior vena cava for placement of IVC filter 3. Inferior venacavogram 4. Placement of a Springer filter infrarenal 5. Introduction catheter into venous system second order catheter placement leftpopliteal approach 6.Percutaneous transluminal angioplasty and stent placement of the left common iliac vein and left external iliac vein 7. Percutaneous transluminal angioplasty of the left common femoral vein 8.Mechanical thrombectomy of theleft common femoral, external iliac and common iliac veins with the penumbra cat 8 device 9.Excisional debridement of necrotic skin and dermis multiple blistered ulcerations left thigh  He improved significantly after that and was  sent home on 10/16/17 on doxy, levaquin and flagyl.and xarelto. The wound on the leg was being treated with silversulphadiazine and xeroform. He was admitted to [facility] 8/8-8/13 for  chemo and 10 days later had fever and skin lesions    Past Medical History:  Diagnosis Date  . Asthma    AS A CHILD-NO INHALERS  . Cancer ([place])    testicular cancer 11/2016 per pt   . DVT (deep venous  thrombosis) ([place])   . GERD (gastroesophageal reflux disease)    TUMS PRN    Past Surgical History:  Procedure Laterality Date  . ANKLE FRACTURE SURGERY Right 2004  . FINE NEEDLE ASPIRATION BIOPSY N/A 11/09/2017   Procedure: FINE NEEDLE ASPIRATION BIOPSY;  Surgeon: [name], MD;  Location: [place] [facility];  Service: Cardiovascular;  Laterality: N/A;  . ORCHIECTOMY Left 11/26/2015   Procedure: ORCHIECTOMY;  Surgeon: [name], MD;  Location: [facility] ORS;  Service: Urology;  Laterality: Left;  radical/Inguinal approach  . PERIPHERAL VASCULAR CATHETERIZATION N/A 12/16/2015   Procedure: [name] Cath Insertion;  Surgeon: [name], MD;  Location: [place] [facility];  Service: Cardiovascular;  Laterality: N/A;  . PERIPHERAL VASCULAR THROMBECTOMY Left 10/13/2017   Procedure: PERIPHERAL VASCULAR THROMBECTOMY;  Surgeon: [name], MD;  Location: [place] [facility] LAB;  Service: Cardiovascular;  Laterality: Left;  . WISDOM TOOTH EXTRACTION  2017    Social History   Tobacco Use  . Smoking status: Current Every Day Smoker    Packs/day: 0.50    Years: 8.00    Pack years: 4.00    Types: Cigarettes  . Smokeless tobacco: Former Systems developer    Types: Snuff  Substance Use Topics  . Alcohol use: No  . Drug use: Not Currently    Types: Marijuana    Family History  Problem Relation Age of Onset  . Skin cancer Mother   . Breast cancer Maternal Grandmother   . Colon cancer Maternal Grandfather   . Diabetes Maternal Grandfather   . Emphysema Father   . Mental illness Sister        anxiety  . Stroke Paternal Grandmother   . Prostate cancer Neg Hx   . Kidney cancer Neg Hx   . Bladder Cancer Neg Hx      . [START ON 12/08/2017] DULoxetine  60 mg Oral Daily  . gabapentin  300 mg Oral QHS  . iopamidol  15 mL Oral Q1 Hr x 2  . [START ON 12/08/2017] loratadine  10 mg Oral Daily  . [START ON 12/08/2017] magnesium oxide  400 mg Oral Daily  . nicotine  21 mg Transdermal Daily  .  potassium chloride SA  20 mEq Oral BID      Abtx:  Anti-infectives (From admission, onward)   Start     Dose/Rate Route Frequency Ordered Stop   12/07/17 1900  ceFEPIme (MAXIPIME) 2 g in sodium chloride 0.9 % 100 mL IVPB     2 g 200 mL/hr over 30 Minutes Intravenous Every 8 hours 12/07/17 1808     12/07/17 1900  vancomycin (VANCOCIN) IVPB 1000 mg/200 mL premix     1,000 mg 200 mL/hr over 60 Minutes Intravenous  Once 12/07/17 1808     12/07/17 1815  metroNIDAZOLE (FLAGYL) IVPB 500 mg     500 mg 100 mL/hr over 60 Minutes Intravenous Every 8 hours 12/07/17 1814         Review of Systems: -fatigue,  weight gain of 15 pounds- says legs  are swelling even more after xarelto was stopped Eyes- no blurred vision ENT- no sore throat, or nasal congestion CVS- no chest pain or SOB RS- no cough, no wheeze GI  left lower quadrant abdominal pain No constipation or diarrhea GU- no hematuria or dysuria MSK- b/l leg swelling- worse No new skin lesions legs Rt foot pain  Skin- lesions on  left thigh improving Neurologic- no headache or dizziness Lymphatic- edema both legs Psychiatric- mood stable   No Known Allergies  OBJECTIVE: No data found.  Physical Exam Constitutional: He is oriented to person, place, and time. He appears well-developed. No distress.  Head: Normocephalic.  Mouth/Throat: Oropharynx is clear and moist.  Eyes: Pupils are equal, round, and reactive to light.  Neck: Neck supple.  Cardiovascular: Intact distal pulses.  No murmur heard. S1s2, tachycardia  Port site okay Pulmonary/Chest: wheeze base, b/l air entry  Abdominal: Soft. Tenderness left lower quadrant Musculoskeletal:   B/l leg swelling Left thigh 3 circular areas of healing skin  tenderness over the rt met head area- no erythema or wound Lymphadenopathy:  None felt  Neurological: He is alert and oriented to person, place, and time. No cranial nerve deficit.  Skin: Skin is warm.  Psychiatric: He  has a normal mood and affect.   skin Healing circular lesions left thigh with no erythema  Or induration  Scabbing wound rt ear    Lab Results CBC    Component Value Date/Time   WBC 7.1 12/07/2017 1144   RBC 2.27 (L) 12/07/2017 1144   HGB 7.3 (L) 12/07/2017 1144   HCT 21.6 (L) 12/07/2017 1144   PLT 26 (LL) 12/07/2017 1144   MCV 95.2 12/07/2017 1144   MCH 32.4 12/07/2017 1144   MCHC 34.0 12/07/2017 1144   RDW 16.7 (H) 12/07/2017 1144   LYMPHSABS 1.0 12/07/2017 1144   MONOABS 0.5 12/07/2017 1144   EOSABS 0.0 12/07/2017 1144   BASOSABS 0.1 12/07/2017 1144    CMP Latest Ref Rng & Units 12/07/2017 12/02/2017 11/29/2017  Glucose 70 - 99 mg/dL 106(H) 101(H) 98  BUN 6 - 20 mg/dL 19 16 22(H)  Creatinine 0.61 - 1.24 mg/dL 1.00 0.69 0.90  Sodium 135 - 145 mmol/L 130(L) 137 135  Potassium 3.5 - 5.1 mmol/L 4.3 3.4(L) 3.8  Chloride 98 - 111 mmol/L 99 105 105  CO2 22 - 32 mmol/L 23 27 23   Calcium 8.9 - 10.3 mg/dL 8.8(L) 8.5(L) 8.9  Total Protein 6.5 - 8.1 g/dL 6.4(L) - -  Total Bilirubin 0.3 - 1.2 mg/dL 0.4 - -  Alkaline Phos 38 - 126 U/L 48 - -  AST 15 - 41 U/L 12(L) - -  ALT 0 - 44 U/L 12 - -      Microbiology: Blood culture sent Radiographs and labs were personally reviewed by me.   Assessment and Plan 27 y.o. male with h/o testicular cancer diagnosed in sept  2017 s/p left orchidectomy  recurrence in June 2019, started TIP chemo, July had DVT of the left leg with severe venous edema , blistering lesions on his left leg,and underwent percutaneous transluminal angioplasty and mechanical thrombectomy of theleft common femoral, external iliac and common iliac veins and  stent placement of the left common iliac vein and left external iliac vein.After chemo in Aug developed fever and skin lesions, had biopsy and was neurophilic dermatitis Now admitted with fever 9 days after his cycle. Has been on prophylactic amoxicillin for the past 2 weeks  Fever 10 days following his last  day  of  chemo and 8 days following GCSF  D.D r/o infection- stent related, vascular infection , ort related,   intra abdominal, rt foot at the site of prior trauma, has a wound rt ear  Is this fever related to GCSF administration/drug fever?  Send blood cultures,swab wound culture bacteria Luella [facility] Ct abdomen-   urine Echo Doppler legs ? Follow up MRI rt foot Start vanco/cefepime and flagyl   No new skin lesions- watch for it Previous 2 cycles were associated with skin lesions and was treated as infection  R/o sweets syndrome from GCSF R/o nodular vasculitis  Discussed the management with patient and Dr.Corcoran   Tsosie Billing, MD  12/07/2017, 6:16 PM

## 2017-12-07 NOTE — H&P (Signed)
Edgar [name] at [place] NAME: Edgar [name]    MR#:  [id number]  DATE OF BIRTH:  31-Aug-1990  DATE OF ADMISSION:  12/07/2017  PRIMARY CARE PHYSICIAN: Dr Nolon Stalls  REQUESTING/REFERRING PHYSICIAN: Dr Nolon Stalls  CHIEF COMPLAINT:  Sent in for clinical sepsis  HISTORY OF PRESENT ILLNESS:  Edgar [name]  is a 27 y.o. male with a known history of recurrent testicular cancer that has been having repeated hospitalizations for skin lesions and fever.  He stated that he was taken off his Xarelto on Friday secondary to low platelet count.  The patient stated he had a fever today.  Feels a little cold.  His last chemotherapy was a few weeks ago.  As per oncology his white count was low a few days ago but now has come up.  The patient complains of leg swelling.  Also noticed some red dots on his legs.  The patient also complains of left lower quadrant abdominal pain.  This is been going on for a while.  Rated about 5 out of 10 in intensity.  PAST MEDICAL HISTORY:   Past Medical History:  Diagnosis Date  . Asthma    AS A CHILD-NO INHALERS  . Cancer ([place])    testicular cancer 11/2016 per pt   . DVT (deep venous thrombosis) ([place])   . GERD (gastroesophageal reflux disease)    TUMS PRN    PAST SURGICAL HISTORY:   Past Surgical History:  Procedure Laterality Date  . ANKLE FRACTURE SURGERY Right 2004  . FINE NEEDLE ASPIRATION BIOPSY N/A 11/09/2017   Procedure: FINE NEEDLE ASPIRATION BIOPSY;  Surgeon: [name], MD;  Location: [place] [facility];  Service: Cardiovascular;  Laterality: N/A;  . IVC FILTER INSERTION    . ORCHIECTOMY Left 11/26/2015   Procedure: ORCHIECTOMY;  Surgeon: [name], MD;  Location: [facility] ORS;  Service: Urology;  Laterality: Left;  radical/Inguinal approach  . PERIPHERAL VASCULAR CATHETERIZATION N/A 12/16/2015   Procedure: [name] Cath Insertion;  Surgeon: [name], MD;  Location: [place] [facility]  LAB;  Service: Cardiovascular;  Laterality: N/A;  . PERIPHERAL VASCULAR THROMBECTOMY Left 10/13/2017   Procedure: PERIPHERAL VASCULAR THROMBECTOMY;  Surgeon: [name], MD;  Location: [place] [facility];  Service: Cardiovascular;  Laterality: Left;  . WISDOM TOOTH EXTRACTION  2017    SOCIAL HISTORY:   Social History   Tobacco Use  . Smoking status: Current Every Day Smoker    Packs/day: 0.50    Years: 8.00    Pack years: 4.00    Types: Cigarettes  . Smokeless tobacco: Former Systems developer    Types: Snuff  Substance Use Topics  . Alcohol use: No    FAMILY HISTORY:   Family History  Problem Relation Age of Onset  . Skin cancer Mother   . Breast cancer Maternal Grandmother   . Colon cancer Maternal Grandfather   . Diabetes Maternal Grandfather   . Emphysema Father   . Mental illness Sister        anxiety  . Stroke Paternal Grandmother   . Prostate cancer Neg Hx   . Kidney cancer Neg Hx   . Bladder Cancer Neg Hx     DRUG ALLERGIES:  No Known Allergies  REVIEW OF SYSTEMS:  CONSTITUTIONAL: Positive for fever.  Feels cold.  Positive for weakness.  EYES: No blurred or double vision.  EARS, NOSE, AND THROAT: No tinnitus or ear pain. No sore throat.  Positive for runny  nose. RESPIRATORY: No cough, shortness of breath, wheezing or hemoptysis.  CARDIOVASCULAR: No chest pain, orthopnea, edema.  GASTROINTESTINAL: No nausea, vomiting, diarrhea.  Positive for abdominal pain. No blood in bowel movements GENITOURINARY: No dysuria, hematuria.  ENDOCRINE: No polyuria, nocturia,  HEMATOLOGY: No anemia, easy bruising or bleeding SKIN: Noticed some red dots on his lower extremities.  Has prior ulcerations from prior admissions MUSCULOSKELETAL: Positive joint pains all over. NEUROLOGIC: No tingling, numbness, weakness.  PSYCHIATRY: No anxiety or depression.   MEDICATIONS AT HOME:   Prior to Admission medications   Medication Sig Start Date End Date Taking? Authorizing Provider   acetaminophen (TYLENOL) 500 MG tablet Take 500-1,000 mg by mouth every 6 (six) hours as needed for mild pain or fever.     [provider]  amoxicillin (AMOXIL) 500 MG tablet Take 1 tablet (500 mg total) by mouth every 8 (eight) hours. 11/26/17   Karen Kitchens, NP  dexamethasone (DECADRON) 4 MG tablet Take 2 tablets (8 mg total) by mouth daily. Take 8 mg by mouth once daily for three days after chemo to prevent nausea 11/26/17   Karen Kitchens, NP  DULoxetine (CYMBALTA) 60 MG capsule Take 1 tablet by mouth daily. 10/05/17 10/05/18  [provider]  gabapentin (NEURONTIN) 300 MG capsule Take 1 capsule (300 mg total) by mouth at bedtime. 11/30/17   Karen Kitchens, NP  loratadine (CLARITIN) 10 MG tablet Take 1 tablet (10 mg total) by mouth daily. 10/29/17   Karen Kitchens, NP  magnesium oxide (MAG-OX) 400 MG tablet Take 1 tablet by mouth daily. 10/04/17   [provider]  nicotine (NICODERM CQ - DOSED IN MG/24 HOURS) 21 mg/24hr patch Place 21 mg onto the skin daily.  09/15/17   [provider]  potassium chloride SA (K-DUR,KLOR-CON) 20 MEQ tablet Take 1 tablet (20 mEq total) by mouth 2 (two) times daily. 11/18/17   Corcoran, Drue Second, MD  XARELTO 20 MG TABS tablet Take 1 tablet by mouth daily. 09/26/17   [provider]      VITAL SIGNS:    PHYSICAL EXAMINATION:  GENERAL:  27 y.o.-year-old patient lying in the bed with no acute distress.  EYES: Pupils equal, round, reactive to light and accommodation. No scleral icterus. Extraocular muscles intact.  HEENT: Head atraumatic, normocephalic. Oropharynx and nasopharynx clear.  NECK:  Supple, no jugular venous distention. No thyroid enlargement, no tenderness.  LUNGS: Decreased breath sounds bilateral bases, no wheezing, rales,rhonchi or crepitation. No use of accessory muscles of respiration.  CARDIOVASCULAR: S1, S2 tachycardic. No murmurs, rubs, or gallops.  ABDOMEN: Soft, nontender, nondistended. Bowel sounds  present. No organomegaly or mass.  EXTREMITIES: 3+ pedal edema, no cyanosis, or clubbing.  NEUROLOGIC: Cranial nerves II through XII are intact. Muscle strength 5/5 in all extremities. Sensation intact. Gait not checked.  PSYCHIATRIC: The patient is alert and oriented x 3.  SKIN: Small petechiae on lower extremities, left leg larger lesion with erythema surrounding a crusting scab, on thigh numerous older lesions without drainage.  These have a darker center area  LABORATORY PANEL:   CBC Recent Labs  Lab 12/07/17 1144  WBC 7.1  HGB 7.3*  HCT 21.6*  PLT 26*   ------------------------------------------------------------------------------------------------------------------  Chemistries  Recent Labs  Lab 12/07/17 1144  NA 130*  K 4.3  CL 99  CO2 23  GLUCOSE 106*  BUN 19  CREATININE 1.00  CALCIUM 8.8*  MG 1.4*  AST 12*  ALT 12  ALKPHOS 48  BILITOT  0.4   ------------------------------------------------------------------------------------------------------------------   IMPRESSION AND PLAN:   1.  Clinical sepsis.  Recent low white count.  Fever tachycardia.  So far unclear etiology.  Blood cultures, urine analysis and urine culture, chest x-ray, CT scan of the abdomen pelvis, peripheral smear ordered.  Empiric antibiotics with vancomycin, cefepime and Flagyl. 2.  Recurrent testicular cancer.  Oncology follow-up. 3.  Chemotherapy-induced thrombocytopenia.  Xarelto on hold with platelet count of 26,000.  Hemoglobin 7.3.  May end up needing a transfusion during the hospital course.  Type and screen ordered.  Neutropenia has improved with a white count in the normal range. 4.  Recurrent skin lesions. 5.  Left lower extremity DVT.  Xarelto currently on hold with thrombocytopenia 6.  Hypomagnesemia replace magnesium IV and orally. 7.  Pain control with oxycodone 8.  Tobacco abuse.  Smoking cessation counseling done 4 minutes by me.  Nicotine patch ordered   All the records are  reviewed and case discussed with ED provider. Management plans discussed with the patient, family and they are in agreement.  CODE STATUS: Full code  TOTAL TIME TAKING CARE OF THIS PATIENT: 50 minutes.    [name] M.D on 12/07/2017 at 6:20 PM  Between 7am to 6pm - Pager - [phone]  After 6pm call admission pager [phone]  [facility]  [phone]  CC: Primary care physician; Dr. Nolon Stalls

## 2017-12-07 NOTE — Consult Note (Addendum)
Pharmacy Antibiotic Note  Edgar [name] is a 27 y.o. male admitted on 12/07/2017 with Febrile Neutropenia.  Pharmacy has been consulted for Vancomycin + Cefepime dosing.  Plan: Patient has testicular cancer and is currently receiving cycle #3 of TIP chemo, day 16. Was admitted last month and treated for cellulitis.  Vancomycin 1 g x 1 has been ordered. Start Cefepime 2 g Q8H. Vancomycin 1250 mg Q8H has been ordered starting 6 hours after 1 g dose. Goal trough 15-20.  PK (using adjusted body weight = 97.8 kg) ke 0.087 T1/2 7.97 hours Vd 68.5 Cmin 17.61  Vancomycin trough has been ordered prior to 5th total dose.     Temp (24hrs), Avg:102.2 F (39 C), Min:102.2 F (39 C), Max:102.2 F (39 C)  Recent Labs  Lab 12/02/17 0833 12/03/17 0858 12/06/17 1145 12/07/17 1144  WBC 0.7* 0.4* 6.8 7.1  CREATININE 0.69  --   --  1.00    Estimated Creatinine Clearance: 154.9 mL/min (by C-G formula based on SCr of 1 mg/dL).    No Known Allergies  Antimicrobials this admission: 0924 Cefepime >>  0924  Vancomycin >>  0924 Flagyl  >>   Microbiology results: 0924 BCx: ordered 0924 UCx: ordered   Pharmacy will continue to monitor.  Thank you for allowing pharmacy to be a part of this patient's care.  Paticia Stack, PharmD Pharmacy Resident  12/07/2017 6:27 PM

## 2017-12-08 ENCOUNTER — Inpatient Hospital Stay: Payer: BLUE CROSS/BLUE SHIELD

## 2017-12-08 ENCOUNTER — Telehealth: Payer: Self-pay | Admitting: *Deleted

## 2017-12-08 DIAGNOSIS — Z79899 Other long term (current) drug therapy: Secondary | ICD-10-CM

## 2017-12-08 DIAGNOSIS — T451X5A Adverse effect of antineoplastic and immunosuppressive drugs, initial encounter: Secondary | ICD-10-CM

## 2017-12-08 DIAGNOSIS — R509 Fever, unspecified: Secondary | ICD-10-CM

## 2017-12-08 DIAGNOSIS — R1032 Left lower quadrant pain: Secondary | ICD-10-CM

## 2017-12-08 DIAGNOSIS — Z9221 Personal history of antineoplastic chemotherapy: Secondary | ICD-10-CM

## 2017-12-08 DIAGNOSIS — Z8547 Personal history of malignant neoplasm of testis: Secondary | ICD-10-CM

## 2017-12-08 DIAGNOSIS — K219 Gastro-esophageal reflux disease without esophagitis: Secondary | ICD-10-CM

## 2017-12-08 DIAGNOSIS — Z86718 Personal history of other venous thrombosis and embolism: Secondary | ICD-10-CM

## 2017-12-08 DIAGNOSIS — E876 Hypokalemia: Secondary | ICD-10-CM

## 2017-12-08 DIAGNOSIS — Z800 Family history of malignant neoplasm of digestive organs: Secondary | ICD-10-CM

## 2017-12-08 DIAGNOSIS — R112 Nausea with vomiting, unspecified: Secondary | ICD-10-CM

## 2017-12-08 DIAGNOSIS — D6481 Anemia due to antineoplastic chemotherapy: Secondary | ICD-10-CM

## 2017-12-08 DIAGNOSIS — Z9079 Acquired absence of other genital organ(s): Secondary | ICD-10-CM

## 2017-12-08 DIAGNOSIS — Z95820 Peripheral vascular angioplasty status with implants and grafts: Secondary | ICD-10-CM

## 2017-12-08 DIAGNOSIS — Z7901 Long term (current) use of anticoagulants: Secondary | ICD-10-CM

## 2017-12-08 DIAGNOSIS — C6212 Malignant neoplasm of descended left testis: Secondary | ICD-10-CM

## 2017-12-08 DIAGNOSIS — R000 Tachycardia, unspecified: Secondary | ICD-10-CM

## 2017-12-08 DIAGNOSIS — I82412 Acute embolism and thrombosis of left femoral vein: Secondary | ICD-10-CM

## 2017-12-08 LAB — URINALYSIS, COMPLETE (UACMP) WITH MICROSCOPIC
Bacteria, UA: NONE SEEN
Bilirubin Urine: NEGATIVE
GLUCOSE, UA: NEGATIVE mg/dL
Hgb urine dipstick: NEGATIVE
Ketones, ur: NEGATIVE mg/dL
Leukocytes, UA: NEGATIVE
Nitrite: NEGATIVE
PROTEIN: NEGATIVE mg/dL
SPECIFIC GRAVITY, URINE: 1.003 — AB (ref 1.005–1.030)
SQUAMOUS EPITHELIAL / LPF: NONE SEEN (ref 0–5)
pH: 6 (ref 5.0–8.0)

## 2017-12-08 LAB — RETICULOCYTES
RBC.: 2.11 MIL/uL — ABNORMAL LOW (ref 4.40–5.90)
Retic Count, Absolute: 40.1 10*3/uL (ref 19.0–183.0)
Retic Ct Pct: 1.9 % (ref 0.4–3.1)

## 2017-12-08 LAB — BASIC METABOLIC PANEL
ANION GAP: 6 (ref 5–15)
BUN: 12 mg/dL (ref 6–20)
CHLORIDE: 102 mmol/L (ref 98–111)
CO2: 25 mmol/L (ref 22–32)
Calcium: 8.4 mg/dL — ABNORMAL LOW (ref 8.9–10.3)
Creatinine, Ser: 0.98 mg/dL (ref 0.61–1.24)
GFR calc Af Amer: >60 mL/min (ref >60)
Glucose, Bld: 111 mg/dL — ABNORMAL HIGH (ref 70–99)
POTASSIUM: 3.7 mmol/L (ref 3.5–5.1)
SODIUM: 133 mmol/L — AB (ref 135–145)

## 2017-12-08 LAB — CBC
HEMATOCRIT: 21.3 % — AB (ref 40.0–52.0)
Hemoglobin: 7.3 g/dL — ABNORMAL LOW (ref 13.0–18.0)
MCH: 32.4 pg (ref 26.0–34.0)
MCHC: 34.4 g/dL (ref 32.0–36.0)
MCV: 94.4 fL (ref 80.0–100.0)
Platelets: 26 10*3/uL — CL (ref 150–440)
RBC: 2.26 MIL/uL — ABNORMAL LOW (ref 4.40–5.90)
RDW: 16.6 % — ABNORMAL HIGH (ref 11.5–14.5)
WBC: 6.3 10*3/uL (ref 3.8–10.6)

## 2017-12-08 LAB — FIBRINOGEN: Fibrinogen: 592 mg/dL — ABNORMAL HIGH (ref 210–475)

## 2017-12-08 LAB — PROTIME-INR
INR: 1.11
Prothrombin Time: 14.2 seconds (ref 11.4–15.2)

## 2017-12-08 LAB — PROCALCITONIN: Procalcitonin: 0.13 ng/mL

## 2017-12-08 LAB — APTT: aPTT: 41 seconds — ABNORMAL HIGH (ref 24–36)

## 2017-12-08 LAB — MAGNESIUM: Magnesium: 1.6 mg/dL — ABNORMAL LOW (ref 1.7–2.4)

## 2017-12-08 MED ORDER — SODIUM CHLORIDE 0.9 % IV SOLN
8.0000 mg | Freq: Three times a day (TID) | INTRAVENOUS | Status: DC | PRN
  Administered 2017-12-08: 8 mg via INTRAVENOUS
  Filled 2017-12-08 (×2): qty 4

## 2017-12-08 MED ORDER — SODIUM CHLORIDE 0.9 % IV SOLN
8.0000 mg | Freq: Three times a day (TID) | INTRAVENOUS | Status: DC
  Administered 2017-12-08 – 2017-12-10 (×6): 8 mg via INTRAVENOUS
  Filled 2017-12-08 (×10): qty 4

## 2017-12-08 MED ORDER — MAGNESIUM SULFATE 2 GM/50ML IV SOLN
2.0000 g | Freq: Once | INTRAVENOUS | Status: AC
  Administered 2017-12-08: 10:00:00 2 g via INTRAVENOUS
  Filled 2017-12-08: qty 50

## 2017-12-08 NOTE — Telephone Encounter (Signed)
IMPRESSION: 1. New nonocclusive thrombus in the left common femoral artery along the anterior wall measuring 3-5 mm in thickness. This is not flow limiting but is new since the prior study on 11/05/2017 and consistent with acute/subacute thrombus. 2. No evidence of right lower extremity deep vein thrombosis.   Electronically Signed   By: Aletta Edouard M.D.   On: 12/08/2017 11:12

## 2017-12-08 NOTE — Consult Note (Signed)
[facility]  Date of admission:  12/07/2017  Inpatient day:  12/08/2017  Consulting physician: Dr, [name].   Reason for Consultation:  Testicular cancer, fever.  Chief Complaint: Edgar [name] is a 27 y.o. male with recurrent testicular carcinoma currently day 17 s/p cycle #3 TIP who was admitted from the oncology clinic with fever (102.2) and tachycardia.  HPI:  The patient received cycle #3 TIP chemotherapy as an inpatient at [facility] 11/22/2017 - 11/27/2017 followed by Margarette Canada support on 11/29/2017.  He has been followed closely in the outpatient department.  ANC was 0 from 12/02/2017 - 12/03/2017.  He received 1 unit of PRBCs on 12/03/2017 for a hemoglobin 7.8.  Platelet count was 33,000 on 12/03/2017 and Xarelto held.  He presented to clinic yesterday for follow-up.  He has been on prophylactic amoxicillin.  He was febrile to 102.2.  He noted pain in the LLQ.  He denied any cough, shortness of breath, dysuria or diarrhea.  He was admitted and started on broad spectrum antibiotics (Cefepime, vancomycin, and Flagyl).  CXR was negative.  UA and culture were negative.  He has continued to have fever spikes.  Blood cultures are negative to date.  Abdomen and pelvic CT on 12/07/2017 revealed left para-aortic, left common iliac, left external iliac and left inguinal lymphadenopathy which was stable to mildly decreased since 11/05/2017 CT.  There was no new or progressive metastatic disease.  There was no acute vascular abnormality.  IVC filter and left iliac venous stent graft were in place.  There was persistent postsurgical fat stranding throughout the left inguinal region without fluid collection.  Bilateral lower extremity duplex on 12/08/2017 revealed new nonocclusive thrombus in the left common femoral vein along the anterior wall measuring 3-5 mm in thickness. This was not flow limiting but is new since the prior study on 11/05/2017 and consistent with  acute/subacute thrombus.  There was no evidence of right lower extremity deep vein thrombosis.  He has had 3 episodes of nausea and vomiting today.  He describes eating a hot dog and chicken tenders from out yesterday.  Family who also ate the chicken have not had any issues.  Fever curve has improved from admission.   Past Medical History:  Diagnosis Date  . Asthma    AS A CHILD-NO INHALERS  . Cancer ([place])    testicular cancer 11/2016 per pt   . DVT (deep venous thrombosis) ([place])   . GERD (gastroesophageal reflux disease)    TUMS PRN    Past Surgical History:  Procedure Laterality Date  . ANKLE FRACTURE SURGERY Right 2004  . FINE NEEDLE ASPIRATION BIOPSY N/A 11/09/2017   Procedure: FINE NEEDLE ASPIRATION BIOPSY;  Surgeon: [name], MD;  Location: [place] [facility];  Service: Cardiovascular;  Laterality: N/A;  . IVC FILTER INSERTION    . ORCHIECTOMY Left 11/26/2015   Procedure: ORCHIECTOMY;  Surgeon: [name], MD;  Location: [facility];  Service: Urology;  Laterality: Left;  radical/Inguinal approach  . PERIPHERAL VASCULAR CATHETERIZATION N/A 12/16/2015   Procedure: [name] Cath Insertion;  Surgeon: [name], MD;  Location: [place] [facility];  Service: Cardiovascular;  Laterality: N/A;  . PERIPHERAL VASCULAR THROMBECTOMY Left 10/13/2017   Procedure: PERIPHERAL VASCULAR THROMBECTOMY;  Surgeon: [name], MD;  Location: [place] [facility];  Service: Cardiovascular;  Laterality: Left;  . WISDOM TOOTH EXTRACTION  2017    Family History  Problem Relation Age of Onset  . Skin cancer Mother   .  Breast cancer Maternal Grandmother   . Colon cancer Maternal Grandfather   . Diabetes Maternal Grandfather   . Emphysema Father   . Mental illness Sister        anxiety  . Stroke Paternal Grandmother   . Prostate cancer Neg Hx   . Kidney cancer Neg Hx   . Bladder Cancer Neg Hx     Social History:  reports that he has been smoking cigarettes. He has a 4.00  pack-year smoking history. He has quit using smokeless tobacco.  His smokeless tobacco use included snuff. He reports that he has current or past drug history. Drug: Marijuana. He reports that he does not drink alcohol.  The patient is accompanied by his parents today.  Allergies: No Known Allergies  Medications Prior to Admission  Medication Sig Dispense Refill  . acetaminophen (TYLENOL) 500 MG tablet Take 500-1,000 mg by mouth every 6 (six) hours as needed for mild pain or fever.     Marland Kitchen amoxicillin (AMOXIL) 500 MG tablet Take 1 tablet (500 mg total) by mouth every 8 (eight) hours. 30 tablet 0  . dexamethasone (DECADRON) 4 MG tablet Take 2 tablets (8 mg total) by mouth daily. Take 8 mg by mouth once daily for three days after chemo to prevent nausea 60 tablet 1  . DULoxetine (CYMBALTA) 60 MG capsule Take 1 tablet by mouth daily.    Marland Kitchen gabapentin (NEURONTIN) 300 MG capsule Take 1 capsule (300 mg total) by mouth at bedtime. 30 capsule 2  . loratadine (CLARITIN) 10 MG tablet Take 1 tablet (10 mg total) by mouth daily. 90 tablet 1  . magnesium oxide (MAG-OX) 400 MG tablet Take 1 tablet by mouth daily.    . nicotine (NICODERM CQ - DOSED IN MG/24 HOURS) 21 mg/24hr patch Place 21 mg onto the skin daily.     . potassium chloride SA (K-DUR,KLOR-CON) 20 MEQ tablet Take 1 tablet (20 mEq total) by mouth 2 (two) times daily. 60 tablet 1  . XARELTO 20 MG TABS tablet Take 1 tablet by mouth daily.      Review of Systems: GENERAL:  Fatigue.  No fevers, sweats.  Recent weight gain secondary to lower extremity edema. PERFORMANCE STATUS (ECOG):  1 HEENT:  No visual changes, runny nose, sore throat, mouth sores or tenderness. Lungs: No shortness of breath or cough.  No hemoptysis. Cardiac:  No chest pain, palpitations, orthopnea, or PND. GI:  Nausea and vomiting x 3 today.  Slight diarrhea.  No constipation, melena or hematochezia. GU:  No urgency, frequency, dysuria, or hematuria. Musculoskeletal:  Right foot  pain, less.  No back pain.  No joint pain.  No muscle tenderness. Extremities:  Leg swelling, improved. Skin:  New left inner thigh pink area.  Fading petechiae. Neuro:  No headache, numbness or weakness, balance or coordination issues. Endocrine:  No diabetes, thyroid issues, hot flashes or night sweats. Psych:  No mood changes, depression or anxiety. Pain:  No focal pain. Review of systems:  All other systems reviewed and found to be negative.  Physical Exam:  Blood pressure 115/66, pulse (!) 105, temperature 99.7 F (37.6 C), temperature source Oral, resp. rate 18, height 6' 2"  (1.88 m), weight 269 lb 12.8 oz (122.4 kg), SpO2 99 %. GENERAL:  Chronically fatigued appearing gentleman sitting on the side of the bed vomiting. MENTAL STATUS:  Alert and oriented to person, place and time. HEAD:  Alopecia.  Normocephalic, atraumatic, face symmetric, no Cushingoid features. EYES:  Brown eyes.  Pupils  equal round and reactive to light and accomodation.  No conjunctivitis or scleral icterus. ENT:  Oropharynx clear without lesion.  Tongue normal. Mucous membranes moist.  RESPIRATORY:  Clear to auscultation without rales, wheezes or rhonchi. CARDIOVASCULAR:  Regular rate and rhythm without murmur, rub or gallop. ABDOMEN:  Soft, tender LLQ without guarding or rebound tenderness. active bowel sounds, and no hepatosplenomegaly.  No masses. SKIN:  New 2-2.5 cm flat pink area left inner thigh.  Old left thigh circular lesions and left calf healing lesion.  Fading lower extremity petechiae. EXTREMITIES: Improving bilateral lower extremity edema.  No palpable cords. LYMPH NODES: No palpable cervical, supraclavicular, axillary or inguinal adenopathy  NEUROLOGICAL: Unremarkable. PSYCH:  Appropriate.   Results for orders placed or performed during the hospital encounter of 12/07/17 (from the past 48 hour(s))  Urinalysis, Complete w Microscopic     Status: Abnormal   Collection Time: 12/07/17  3:00 AM   Result Value Ref Range   Color, Urine STRAW (A) YELLOW   APPearance CLEAR (A) CLEAR   Specific Gravity, Urine 1.003 (L) 1.005 - 1.030   pH 6.0 5.0 - 8.0   Glucose, UA NEGATIVE NEGATIVE mg/dL   Hgb urine dipstick NEGATIVE NEGATIVE   Bilirubin Urine NEGATIVE NEGATIVE   Ketones, ur NEGATIVE NEGATIVE mg/dL   Protein, ur NEGATIVE NEGATIVE mg/dL   Nitrite NEGATIVE NEGATIVE   Leukocytes, UA NEGATIVE NEGATIVE   RBC / HPF 0-5 0 - 5 RBC/hpf   WBC, UA 0-5 0 - 5 WBC/hpf   Bacteria, UA NONE SEEN NONE SEEN   Squamous Epithelial / LPF NONE SEEN 0 - 5    Comment: Performed at [facility], [address] [place], [place] [address]  CULTURE, BLOOD (ROUTINE X 2) w Reflex to ID Panel     Status: None (Preliminary result)   Collection Time: 12/07/17  5:41 PM  Result Value Ref Range   Specimen Description BLOOD RAC    Special Requests      BOTTLES DRAWN AEROBIC AND ANAEROBIC Blood Culture adequate volume   Culture      NO GROWTH < 12 HOURS Performed at [facility], [address] [place], [place] [address]    Report Status PENDING   Type and screen [place]     Status: None   Collection Time: 12/07/17  5:41 PM  Result Value Ref Range   ABO/RH(D) A POS    Antibody Screen NEG    Sample Expiration      12/10/2017 Performed at [facility], [address] [place], [place] [address]   CULTURE, BLOOD (ROUTINE X 2) w Reflex to ID Panel     Status: None (Preliminary result)   Collection Time: 12/07/17  7:28 PM  Result Value Ref Range   Specimen Description BLOOD RIGHT ARM    Special Requests      BOTTLES DRAWN AEROBIC AND ANAEROBIC Blood Culture results may not be optimal due to an excessive volume of blood received in culture bottles   Culture      NO GROWTH < 12 HOURS Performed at [facility], [address] [place], [place] [address]    Report Status PENDING   Basic metabolic panel     Status: Abnormal   Collection Time:  12/08/17  4:12 AM  Result Value Ref Range   Sodium 133 (L) 135 - 145 mmol/L   Potassium 3.7 3.5 - 5.1 mmol/L   Chloride 102 98 - 111 mmol/L   CO2  25 22 - 32 mmol/L   Glucose, Bld 111 (H) 70 - 99 mg/dL   BUN 12 6 - 20 mg/dL   Creatinine, Ser 0.98 0.61 - 1.24 mg/dL   Calcium 8.4 (L) 8.9 - 10.3 mg/dL   GFR calc non Af Amer >60 >60 mL/min   GFR calc Af Amer >60 >60 mL/min    Comment: (NOTE) The eGFR has been calculated using the CKD EPI equation. This calculation has not been validated in all clinical situations. eGFR's persistently <60 mL/min signify possible Chronic Kidney Disease.    Anion gap 6 5 - 15    Comment: Performed at [facility], [address] [place], [place] [address]  CBC     Status: Abnormal   Collection Time: 12/08/17  4:12 AM  Result Value Ref Range   WBC 6.3 3.8 - 10.6 K/uL   RBC 2.26 (L) 4.40 - 5.90 MIL/uL   Hemoglobin 7.3 (L) 13.0 - 18.0 g/dL   HCT 21.3 (L) 40.0 - 52.0 %   MCV 94.4 80.0 - 100.0 fL   MCH 32.4 26.0 - 34.0 pg   MCHC 34.4 32.0 - 36.0 g/dL   RDW 16.6 (H) 11.5 - 14.5 %   Platelets 26 (LL) 150 - 440 K/uL    Comment: RESULT REPEATED AND VERIFIED CRITICAL VALUE NOTED.  VALUE IS CONSISTENT WITH PREVIOUSLY REPORTED AND CALLED VALUE. Performed at [facility], [address] [place], [place] [address]   Magnesium     Status: Abnormal   Collection Time: 12/08/17  7:32 AM  Result Value Ref Range   Magnesium 1.6 (L) 1.7 - 2.4 mg/dL    Comment: Performed at [facility], [address] [place], [place] [address]  Protime-INR     Status: None   Collection Time: 12/08/17  7:32 AM  Result Value Ref Range   Prothrombin Time 14.2 11.4 - 15.2 seconds   INR 1.11     Comment: Performed at [facility], [address] [place], [place] [address]  APTT     Status: Abnormal   Collection Time: 12/08/17  7:32 AM  Result Value Ref Range   aPTT 41 (H) 24 - 36 seconds    Comment:        IF BASELINE aPTT IS  ELEVATED, SUGGEST PATIENT RISK ASSESSMENT BE USED TO DETERMINE APPROPRIATE ANTICOAGULANT THERAPY. Performed at [facility], [address] [place], [place] [address]   Fibrinogen     Status: Abnormal   Collection Time: 12/08/17  7:32 AM  Result Value Ref Range   Fibrinogen 592 (H) 210 - 475 mg/dL    Comment: Performed at [facility], [address] [place], [place] [address]  Reticulocytes     Status: Abnormal   Collection Time: 12/08/17  7:32 AM  Result Value Ref Range   Retic Ct Pct 1.9 0.4 - 3.1 %   RBC. 2.11 (L) 4.40 - 5.90 MIL/uL   Retic Count, Absolute 40.1 19.0 - 183.0 K/uL    Comment: Performed at [facility], [address] [place], [place] [address]  Procalcitonin - Baseline     Status: None   Collection Time: 12/08/17  7:32 AM  Result Value Ref Range   Procalcitonin 0.13 ng/mL    Comment:        Interpretation: PCT (Procalcitonin) <= 0.5 ng/mL: Systemic infection (sepsis) is not likely. Local bacterial infection is possible. (NOTE)       Sepsis PCT Algorithm  Lower Respiratory Tract                                      Infection PCT Algorithm    ----------------------------     ----------------------------         PCT < 0.25 ng/mL                PCT < 0.10 ng/mL         Strongly encourage             Strongly discourage   discontinuation of antibiotics    initiation of antibiotics    ----------------------------     -----------------------------       PCT 0.25 - 0.50 ng/mL            PCT 0.10 - 0.25 ng/mL               OR       >80% decrease in PCT            Discourage initiation of                                            antibiotics      Encourage discontinuation           of antibiotics    ----------------------------     -----------------------------         PCT >= 0.50 ng/mL              PCT 0.26 - 0.50 ng/mL               AND        <80% decrease in PCT             Encourage initiation of                                              antibiotics       Encourage continuation           of antibiotics    ----------------------------     -----------------------------        PCT >= 0.50 ng/mL                  PCT > 0.50 ng/mL               AND         increase in PCT                  Strongly encourage                                      initiation of antibiotics    Strongly encourage escalation           of antibiotics                                     -----------------------------  PCT <= 0.25 ng/mL                                                 OR                                        > 80% decrease in PCT                                     Discontinue / Do not initiate                                             antibiotics Performed at [facility], [address] [place], [place] [address]    Dg Chest 2 View  Result Date: 12/08/2017 CLINICAL DATA:  Fever EXAM: CHEST - 2 VIEW COMPARISON:  11/04/2017 chest radiograph. FINDINGS: Right internal jugular MediPort terminates at the cavoatrial junction. Stable cardiomediastinal silhouette with normal heart size. No pneumothorax. No pleural effusion. Lungs appear clear, with no acute consolidative airspace disease and no pulmonary edema. IMPRESSION: No active cardiopulmonary disease. Electronically Signed   By: Ilona Sorrel M.D.   On: 12/08/2017 01:56   Ct Abdomen Pelvis W Contrast  Result Date: 12/08/2017 CLINICAL DATA:  Recurrent testicular cancer. Most recent chemotherapy a few weeks prior. Lower extremity swelling. Recent discontinuation of Xarelto anticoagulation due to thrombocytopenia. Left lower quadrant abdominal pain. EXAM: CT ABDOMEN AND PELVIS WITH CONTRAST TECHNIQUE: Multidetector CT imaging of the abdomen and pelvis was performed using the standard protocol following bolus administration of intravenous contrast. CONTRAST:  135m ISOVUE-300 IOPAMIDOL (ISOVUE-300) INJECTION 61%  COMPARISON:  11/05/2017 CT abdomen/pelvis. FINDINGS: Lower chest: No significant pulmonary nodules or acute consolidative airspace disease. Tip of superior approach central venous catheter is seen at the cavoatrial junction. Hepatobiliary: Normal liver size. No liver mass. Normal gallbladder with no radiopaque cholelithiasis. No biliary ductal dilatation. Pancreas: Normal, with no mass or duct dilation. Spleen: Normal size. No mass. Adrenals/Urinary Tract: Normal adrenals. Normal size kidneys with no renal masses. Mild fullness of the central right renal collecting system without overt right hydronephrosis. No left hydronephrosis. Normal bladder. Stomach/Bowel: Normal non-distended stomach. A few mildly dilated small bowel loops in the left abdomen measuring up to 3.3 cm diameter. No focal small bowel caliber transition. No small bowel wall thickening. Oral contrast transits to the mid small bowel. Normal appendix. Normal large bowel with no diverticulosis, large bowel wall thickening or pericolonic fat stranding. Vascular/Lymphatic: Normal caliber abdominal aorta. Patent hepatic, portal, splenic and renal veins. IVC filter is in place below the level of the renal veins. Left common and external iliac venous stent is in place and appears patent. No evidence of venous thrombosis. Mildly enlarged left inguinal nodes up to 1.2 cm (series 2/image 95), stable since 11/05/2017 CT. Enlarged 2.3 cm left external iliac node (series 2/image 80), previously 2.4 cm using similar measurement technique, stable. Left common iliac 1.2 cm enlarged node (series 2/image 65), previously 1.6 cm, mildly decreased. Mild left para-aortic adenopathy up to 1.1 cm (series 2/image 57), previously 1.2  cm using similar measurement technique, not appreciably changed. No new pathologically enlarged abdominopelvic nodes. Reproductive: Normal size prostate. Other: No pneumoperitoneum, ascites or focal fluid collection. Fat stranding throughout the  left inguinal region is not significantly changed. Musculoskeletal: No aggressive appearing focal osseous lesions. IMPRESSION: 1. Left para-aortic, left common iliac, left external iliac and left inguinal lymphadenopathy is stable to mildly decreased since 11/05/2017 CT. No new or progressive metastatic disease. 2. No acute vascular abnormality. IVC filter and left iliac venous stent graft are in place. 3. Persistent postsurgical fat stranding throughout the left inguinal region without fluid collection. Electronically Signed   By: Ilona Sorrel M.D.   On: 12/08/2017 01:55   US Venous Img Lower Bilateral  Addendum Date: 12/08/2017   ADDENDUM REPORT: 12/08/2017 12:04 ADDENDUM: There is an error in the report: Nonocclusive thrombus is in the left common femoral vein. Electronically Signed   By: Aletta Edouard M.D.   On: 12/08/2017 12:04   Result Date: 12/08/2017 CLINICAL DATA:  Bilateral lower extremity edema. EXAM: BILATERAL LOWER EXTREMITY VENOUS DOPPLER ULTRASOUND TECHNIQUE: Gray-scale sonography with graded compression, as well as color Doppler and duplex ultrasound were performed to evaluate the lower extremity deep venous systems from the level of the common femoral vein and including the common femoral, femoral, profunda femoral, popliteal and calf veins including the posterior tibial, peroneal and gastrocnemius veins when visible. The superficial great saphenous vein was also interrogated. Spectral Doppler was utilized to evaluate flow at rest and with distal augmentation maneuvers in the common femoral, femoral and popliteal veins. COMPARISON:  11/05/2017 FINDINGS: RIGHT LOWER EXTREMITY Common Femoral Vein: No evidence of thrombus. Normal compressibility, respiratory phasicity and response to augmentation. Saphenofemoral Junction: No evidence of thrombus. Normal compressibility and flow on color Doppler imaging. Profunda Femoral Vein: No evidence of thrombus. Normal compressibility and flow on color  Doppler imaging. Femoral Vein: No evidence of thrombus. Normal compressibility, respiratory phasicity and response to augmentation. Popliteal Vein: No evidence of thrombus. Normal compressibility, respiratory phasicity and response to augmentation. Calf Veins: No evidence of thrombus. Normal compressibility and flow on color Doppler imaging. Superficial Great Saphenous Vein: No evidence of thrombus. Normal compressibility. Venous Reflux:  None. Other Findings: No evidence of superficial thrombophlebitis or abnormal fluid collection. LEFT LOWER EXTREMITY Common Femoral Vein: There is evidence of new nonocclusive thrombus in the left common femoral artery along the anterior wall measuring approximately 3-5 mm in thickness. This does not create flow limitation with good flow seen in the lumen below the level of thrombus. However, compared to the prior study just 1 month ago, this finding is new and consistent with acute/subacute thrombus. Saphenofemoral Junction: No evidence of thrombus. Normal compressibility and flow on color Doppler imaging. Profunda Femoral Vein: No evidence of thrombus. Normal compressibility and flow on color Doppler imaging. Femoral Vein: No evidence of thrombus. Normal compressibility, respiratory phasicity and response to augmentation. Popliteal Vein: No evidence of thrombus. Normal compressibility, respiratory phasicity and response to augmentation. Calf Veins: No evidence of thrombus. Normal compressibility and flow on color Doppler imaging. Superficial Great Saphenous Vein: No evidence of thrombus. Normal compressibility. Venous Reflux:  None. Other Findings: No evidence of superficial thrombophlebitis or abnormal fluid collection. IMPRESSION: 1. New nonocclusive thrombus in the left common femoral artery along the anterior wall measuring 3-5 mm in thickness. This is not flow limiting but is new since the prior study on 11/05/2017 and consistent with acute/subacute thrombus. 2. No evidence  of right lower extremity deep vein thrombosis. Electronically Signed:  By: Aletta Edouard M.D. On: 12/08/2017 11:12    Assessment:  The patient is a 27 y.o.  gentleman with recurrent testicular carcinoma currently day 17 s/p cycle #3 TIP chemotherapy with Udencya support.  Abdomen and pelvic CT on 12/07/2017 revealed no new or progressive metastatic disease.  There was no acute vascular abnormality.  IVC filter and left iliac venous stent graft were in place. There was persistent postsurgical fat stranding throughout the left inguinal region without fluid collection.  Bilateral lower extremity duplex on 12/08/2017 revealed new nonocclusive thrombus in the left common femoral vein along the anterior wall measuring 3-5 mm in thickness. This was not flow limiting but is new since the prior study on 11/05/2017 and consistent with acute/subacute thrombus.  There was no evidence of right lower extremity deep vein thrombosis.  Symptomatically, he has had nausea and vomiting today.  Exam reveals a new 2-2.5 cm pink area in the left thigh (similar to prior development of lesions).  Lower extremity edema has improved.  Petechiae are fading.  Hemoglobin 7.3.  Platelet count is 26,000.  WBC is 6300.  Plan:   1.  Oncology:  Counts have not recovered from last cycle.  Anticipate cycle #4 TIP chemotherapy (last) postponed until 12/20/2017.  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 2.  Hematology:  Chemotherapy induced anemia.  Maintain active type and screen.  Discuss plan for 1 unit of PRBCs once fever improves.  Platelet count remains low post chemotherapy likely secondary to increased consumption caused by fever.  Check PT, PTT, and fibrinogen today.  Daily CBC with diff. 3.  Vascular:  Abdomen and pelvic CT revealed intact stent and IVC filter without clot.  Bilateral lower extremity duplex reveals old clot in the left common femoral vein per Dr.  Delana Meyer.  Continue to hold Xarelto while platelet count low. 4.  Infectious disease:  Patient pan cultured and started on vancomycin, Cefepime, and Flagyl.  Blood culture q 24 hours prn temp >= 100.4  May require follow-up imaging of right foot to assess foreign body status and earlier consideration of osteomyelitis.  Abdominal CT without evidence of infection.   Appreciate ID consult. 5.  Skin lesions:  Continue close observation given prior cycles.  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   Will discuss with pharmaceutical company regarding possible etiology Margarette Canada) and treatment (now and with future cycles). 6.  Gastroenterology:  Nausea and vomiting today felt secondary to Flagyl.  Ondansetron scheduled q 8 hours. 7.  Electrolyte wasting:  Patient has potassium and magnesium wasting secondary to cisplatin.  Follow electrolytes daily.  Supplement as needed.  Thank you for allowing me to participate in Edgar [name] 's care.  I will follow him closely with you while hospitalized and after discharge in the outpatient department.   [name], MD  12/08/2017, 7:57 PM

## 2017-12-08 NOTE — Progress Notes (Signed)
Edgar [name] is a 27 y.o. male with testicular cancer is admitted with fever from [place] office Pt took his  chemo 11/22/17-11/26/17 and was DC on 9/14. He went to his Onc office on 9/16 with pain rt foot at the site of prior injury sustained by FB piercing his skin . He was given pegfilgrastim-cbqv on 11/29/17 He was seen by podiatrist on 9/19 as well and no infection noted . Xray had minute radioopaque densities. Was asked to keep an eye on the foot. He had gone back to his Oncologist office today and noted to have fever of 102. Pt did not feel any temp. He has left lower quadrant pain for some time worse since xarelto was stopped as he had low platelets  He has a complicated medical History   h/o testicular cancer diagnosed in sept 2017 s/p left orchidectomys/p 3 cycles of BEP chemo , recurrence in June 2019, started TIP chemo, July had DVT of the left leg with severe venous edema ,blistering lesions on his left leg,and underwent percutaneous transluminal angioplasty and mechanical thrombectomy of theleft common femoral, external iliac and common iliac veinsand stent placement of the left common iliac vein and left external iliac vein. Left leg cellulitis and lesions on his left thigh In Aug after his cycle of chemo he was admitted with fever and skin lesions and underwent biopsy which showed neutrophilic dermatosis Was treated with IV antibiotics and went on Po amoxicillin  has vomiting Had high fever this morning OBJECTIVE: Patient Vitals for the past 24 hrs:  BP Temp Temp src Pulse Resp SpO2 Height Weight  12/08/17 0429 121/66 (!) 103.1 F (39.5 C) Oral (!) 119 20 94 % 6' 2"  (1.88 m) 122.4 kg  12/07/17 2139 - 99.4 F (37.4 C) Axillary - - - - -  12/07/17 2038 - (!) 101.8 F (38.8 C) Axillary - - - - -  12/07/17 1940 (!) 103/56 (!) 103.1 F (39.5 C) Oral (!) 111 (!) 22 100 % - -    Physical Exam Constitutional: He isoriented to person, place, and time. He  appearswell-developed.No distress.  Head:Normocephalic.  Mouth/Throat:Oropharynx is clear and moist.  Eyes:Pupils are equal, round, and reactive to light.  Neck:Neck supple.  Cardiovascular:Intact distal pulses. No murmurheard. S1s2, tachycardia Port site okay Pulmonary/Chest: wheeze base, b/l air entry  Abdominal:Soft. Tenderness left lower quadrant Musculoskeletal:  B/l leg swelling Left thigh 3 circular areas of healing skin tenderness over the rt met head area- no erythema or wound Lymphadenopathy:  None felt  Neurological: He isalertand oriented to person, place, and time. Nocranial nerve deficit.  Skin: Skin iswarm.  Psychiatric: He has anormal mood and affect. skin Healing circular lesions left thigh with no erythema Or induration  Scabbing wound rt ear    Lab Results CBC Latest Ref Rng & Units 12/08/2017 12/07/2017 12/06/2017  WBC 3.8 - 10.6 K/uL 6.3 7.1 6.8  Hemoglobin 13.0 - 18.0 g/dL 7.3(L) 7.3(L) 8.6(L)  Hematocrit 40.0 - 52.0 % 21.3(L) 21.6(L) 25.2(L)  Platelets 150 - 440 K/uL 26(LL) 26(LL) 22(LL)   CMP Latest Ref Rng & Units 12/08/2017 12/07/2017 12/02/2017  Glucose 70 - 99 mg/dL 111(H) 106(H) 101(H)  BUN 6 - 20 mg/dL 12 19 16   Creatinine 0.61 - 1.24 mg/dL 0.98 1.00 0.69  Sodium 135 - 145 mmol/L 133(L) 130(L) 137  Potassium 3.5 - 5.1 mmol/L 3.7 4.3 3.4(L)  Chloride 98 - 111 mmol/L 102 99 105  CO2 22 - 32 mmol/L 25 23 27   Calcium 8.9 -  10.3 mg/dL 8.4(L) 8.8(L) 8.5(L)  Total Protein 6.5 - 8.1 g/dL - 6.4(L) -  Total Bilirubin 0.3 - 1.2 mg/dL - 0.4 -  Alkaline Phos 38 - 126 U/L - 48 -  AST 15 - 41 U/L - 12(L) -  ALT 0 - 44 U/L - 12 -        Microbiology: Blood culture sent Radiographs and labs were personally reviewed by me.   Assessment and Plan 27 y.o.malewith h/o testicular cancer diagnosed in sept 2017 s/p left orchidectomy  recurrence in June 2019, started TIP chemo, July had DVT of the left leg with severe venous  edema ,blistering lesions on his left leg,and underwent percutaneous transluminal angioplasty and mechanical thrombectomy of theleft common femoral, external iliac and common iliac veinsand stent placement of the left common iliac vein and left external iliac vein.After chemo in Aug developed fever and skin lesions, had biopsy and was neurophilic dermatitis Now admitted with fever 9 days after his cycle. Has been on prophylactic amoxicillin for the past 2 weeks  Fever 10 days following his last day of  chemo and 8 days following GCSF  D.D r/o infection- stent related, vascular infection , ort related,   intra abdominal, rt foot at the site of prior trauma, has a wound rt ear  Very likely this is fever caused by  GCSF Pegfigrastim cbvq May need  steroids   Send blood cultures,swab wound culture bacteria Luella Cook Ct abdomen- No acute findings  urine-N Echo Doppler legs ? Follow up MRI rt foot continue vanco/cefepime and flagyl   No new skin lesions- watch for it Previous 2 cycles were associated with skin lesions and was treated as infection  R/o sweets syndrome from GCSF R/o nodular vasculitis  Discussed the management with patient and Dr.Corcoran

## 2017-12-08 NOTE — Telephone Encounter (Addendum)
Noted. Results reviewed. Dr. Mike Gip is contacting vascular Delana Meyer, MD) to discuss. We will let you know what we find outside.   Honor Loh, MSN, APRN, FNP-C, CEN Oncology/Hematology Nurse Practitioner  [facility] 12/08/17, 11:48 AM

## 2017-12-08 NOTE — Progress Notes (Signed)
[facility] [place] NAME: Edgar [name]    MR#:  [id number]  DATE OF BIRTH:  1990-08-17  SUBJECTIVE:  CHIEF COMPLAINT:  No chief complaint on file.  - admitted with fevers - low hb and plts - new DVT  REVIEW OF SYSTEMS:  Review of Systems  Constitutional: Positive for fever and malaise/fatigue. Negative for chills.  HENT: Negative for congestion, ear discharge, hearing loss and nosebleeds.   Respiratory: Negative for cough, shortness of breath and wheezing.   Cardiovascular: Negative for chest pain and palpitations.  Gastrointestinal: Positive for nausea and vomiting. Negative for abdominal pain, constipation and diarrhea.  Genitourinary: Negative for dysuria.  Musculoskeletal: Positive for myalgias.  Neurological: Negative for dizziness, focal weakness, seizures, weakness and headaches.  Psychiatric/Behavioral: Negative for depression.    DRUG ALLERGIES:  No Known Allergies  VITALS:  Blood pressure 115/66, pulse (!) 105, temperature 99.7 F (37.6 C), temperature source Oral, resp. rate 18, height 6\' 2"  (1.88 m), weight 122.4 kg, SpO2 99 %.  PHYSICAL EXAMINATION:  Physical Exam  GENERAL:  27 y.o.-year-old patient lying in the bed with no acute distress.  EYES: Pupils equal, round, reactive to light and accommodation. No scleral icterus. Extraocular muscles intact.  HEENT: Head atraumatic, normocephalic. Oropharynx and nasopharynx clear.  NECK:  Supple, no jugular venous distention. No thyroid enlargement, no tenderness.  LUNGS: Normal breath sounds bilaterally, no wheezing, rales,rhonchi or crepitation. No use of accessory muscles of respiration.  CARDIOVASCULAR: S1, S2 normal. No murmurs, rubs, or gallops.  ABDOMEN: Soft, nontender, nondistended. Bowel sounds present. No organomegaly or mass.  EXTREMITIES: bilateral lower extremity edema left> right, petechiae noted. no cyanosis, or clubbing.  NEUROLOGIC: Cranial nerves II  through XII are intact. Muscle strength 5/5 in all extremities. Sensation intact. Gait not checked.  PSYCHIATRIC: The patient is alert and oriented x 3.  SKIN: No obvious rash, lesion, or ulcer.    LABORATORY PANEL:   CBC Recent Labs  Lab 12/08/17 0412  WBC 6.3  HGB 7.3*  HCT 21.3*  PLT 26*   ------------------------------------------------------------------------------------------------------------------  Chemistries  Recent Labs  Lab 12/07/17 1144 12/08/17 0412 12/08/17 0732  NA 130* 133*  --   K 4.3 3.7  --   CL 99 102  --   CO2 23 25  --   GLUCOSE 106* 111*  --   BUN 19 12  --   CREATININE 1.00 0.98  --   CALCIUM 8.8* 8.4*  --   MG 1.4*  --  1.6*  AST 12*  --   --   ALT 12  --   --   ALKPHOS 48  --   --   BILITOT 0.4  --   --    ------------------------------------------------------------------------------------------------------------------  Cardiac Enzymes No results for input(s): TROPONINI in the last 168 hours. ------------------------------------------------------------------------------------------------------------------  RADIOLOGY:  Dg Chest 2 View  Result Date: 12/08/2017 CLINICAL DATA:  Fever EXAM: CHEST - 2 VIEW COMPARISON:  11/04/2017 chest radiograph. FINDINGS: Right internal jugular MediPort terminates at the cavoatrial junction. Stable cardiomediastinal silhouette with normal heart size. No pneumothorax. No pleural effusion. Lungs appear clear, with no acute consolidative airspace disease and no pulmonary edema. IMPRESSION: No active cardiopulmonary disease. Electronically Signed   By: Ilona Sorrel M.D.   On: 12/08/2017 01:56   Ct Abdomen Pelvis W Contrast  Result Date: 12/08/2017 CLINICAL DATA:  Recurrent testicular cancer. Most recent chemotherapy a few weeks prior. Lower extremity swelling. Recent discontinuation of Xarelto anticoagulation  due to thrombocytopenia. Left lower quadrant abdominal pain. EXAM: CT ABDOMEN AND PELVIS WITH CONTRAST  TECHNIQUE: Multidetector CT imaging of the abdomen and pelvis was performed using the standard protocol following bolus administration of intravenous contrast. CONTRAST:  136mL ISOVUE-300 IOPAMIDOL (ISOVUE-300) INJECTION 61% COMPARISON:  11/05/2017 CT abdomen/pelvis. FINDINGS: Lower chest: No significant pulmonary nodules or acute consolidative airspace disease. Tip of superior approach central venous catheter is seen at the cavoatrial junction. Hepatobiliary: Normal liver size. No liver mass. Normal gallbladder with no radiopaque cholelithiasis. No biliary ductal dilatation. Pancreas: Normal, with no mass or duct dilation. Spleen: Normal size. No mass. Adrenals/Urinary Tract: Normal adrenals. Normal size kidneys with no renal masses. Mild fullness of the central right renal collecting system without overt right hydronephrosis. No left hydronephrosis. Normal bladder. Stomach/Bowel: Normal non-distended stomach. A few mildly dilated small bowel loops in the left abdomen measuring up to 3.3 cm diameter. No focal small bowel caliber transition. No small bowel wall thickening. Oral contrast transits to the mid small bowel. Normal appendix. Normal large bowel with no diverticulosis, large bowel wall thickening or pericolonic fat stranding. Vascular/Lymphatic: Normal caliber abdominal aorta. Patent hepatic, portal, splenic and renal veins. IVC filter is in place below the level of the renal veins. Left common and external iliac venous stent is in place and appears patent. No evidence of venous thrombosis. Mildly enlarged left inguinal nodes up to 1.2 cm (series 2/image 95), stable since 11/05/2017 CT. Enlarged 2.3 cm left external iliac node (series 2/image 80), previously 2.4 cm using similar measurement technique, stable. Left common iliac 1.2 cm enlarged node (series 2/image 65), previously 1.6 cm, mildly decreased. Mild left para-aortic adenopathy up to 1.1 cm (series 2/image 57), previously 1.2 cm using similar  measurement technique, not appreciably changed. No new pathologically enlarged abdominopelvic nodes. Reproductive: Normal size prostate. Other: No pneumoperitoneum, ascites or focal fluid collection. Fat stranding throughout the left inguinal region is not significantly changed. Musculoskeletal: No aggressive appearing focal osseous lesions. IMPRESSION: 1. Left para-aortic, left common iliac, left external iliac and left inguinal lymphadenopathy is stable to mildly decreased since 11/05/2017 CT. No new or progressive metastatic disease. 2. No acute vascular abnormality. IVC filter and left iliac venous stent graft are in place. 3. Persistent postsurgical fat stranding throughout the left inguinal region without fluid collection. Electronically Signed   By: Ilona Sorrel M.D.   On: 12/08/2017 01:55   US Venous Img Lower Bilateral  Addendum Date: 12/08/2017   ADDENDUM REPORT: 12/08/2017 12:04 ADDENDUM: There is an error in the report: Nonocclusive thrombus is in the left common femoral vein. Electronically Signed   By: Aletta Edouard M.D.   On: 12/08/2017 12:04   Result Date: 12/08/2017 CLINICAL DATA:  Bilateral lower extremity edema. EXAM: BILATERAL LOWER EXTREMITY VENOUS DOPPLER ULTRASOUND TECHNIQUE: Gray-scale sonography with graded compression, as well as color Doppler and duplex ultrasound were performed to evaluate the lower extremity deep venous systems from the level of the common femoral vein and including the common femoral, femoral, profunda femoral, popliteal and calf veins including the posterior tibial, peroneal and gastrocnemius veins when visible. The superficial great saphenous vein was also interrogated. Spectral Doppler was utilized to evaluate flow at rest and with distal augmentation maneuvers in the common femoral, femoral and popliteal veins. COMPARISON:  11/05/2017 FINDINGS: RIGHT LOWER EXTREMITY Common Femoral Vein: No evidence of thrombus. Normal compressibility, respiratory phasicity  and response to augmentation. Saphenofemoral Junction: No evidence of thrombus. Normal compressibility and flow on color Doppler imaging. Profunda Femoral  Vein: No evidence of thrombus. Normal compressibility and flow on color Doppler imaging. Femoral Vein: No evidence of thrombus. Normal compressibility, respiratory phasicity and response to augmentation. Popliteal Vein: No evidence of thrombus. Normal compressibility, respiratory phasicity and response to augmentation. Calf Veins: No evidence of thrombus. Normal compressibility and flow on color Doppler imaging. Superficial Great Saphenous Vein: No evidence of thrombus. Normal compressibility. Venous Reflux:  None. Other Findings: No evidence of superficial thrombophlebitis or abnormal fluid collection. LEFT LOWER EXTREMITY Common Femoral Vein: There is evidence of new nonocclusive thrombus in the left common femoral artery along the anterior wall measuring approximately 3-5 mm in thickness. This does not create flow limitation with good flow seen in the lumen below the level of thrombus. However, compared to the prior study just 1 month ago, this finding is new and consistent with acute/subacute thrombus. Saphenofemoral Junction: No evidence of thrombus. Normal compressibility and flow on color Doppler imaging. Profunda Femoral Vein: No evidence of thrombus. Normal compressibility and flow on color Doppler imaging. Femoral Vein: No evidence of thrombus. Normal compressibility, respiratory phasicity and response to augmentation. Popliteal Vein: No evidence of thrombus. Normal compressibility, respiratory phasicity and response to augmentation. Calf Veins: No evidence of thrombus. Normal compressibility and flow on color Doppler imaging. Superficial Great Saphenous Vein: No evidence of thrombus. Normal compressibility. Venous Reflux:  None. Other Findings: No evidence of superficial thrombophlebitis or abnormal fluid collection. IMPRESSION: 1. New nonocclusive  thrombus in the left common femoral artery along the anterior wall measuring 3-5 mm in thickness. This is not flow limiting but is new since the prior study on 11/05/2017 and consistent with acute/subacute thrombus. 2. No evidence of right lower extremity deep vein thrombosis. Electronically Signed: By: Aletta Edouard M.D. On: 12/08/2017 11:12    EKG:   Orders placed or performed in visit on 12/07/17  . EKG 12-Lead  . EKG 12-Lead  . EKG 12-Lead    ASSESSMENT AND PLAN:   27 y/o M with PMH significant for testicular cancer on chemo, h/o DVT s/p IVC, sathma, gerd admitted with fevers.  1. Fevers- drug induced vs infection - appreciate ID consult - cultures pending- currently on braod spectrum ABX with vanc, cefepime and flagyl - recurrence of skin lesions noted as well - lesion from ear lobe to be cultured - CT abd- no acute findings  2. Left leg DVT- unable to anticoagulate due to anemia and thrombocytopenia - s/p IVC - vascular consult appreciated- likely chronic clot - xarelto as outpt whenever counts improve  3. Anemia and thrombocytopenia- from chemo -transfuse Prbc one fevers resolve  4. Testicular cancer- mgmt per oncology - s/p 3 cycles of chemo  5. DVT prophylaxis- TEDs and SCDs only due to anemia and thrombocytopenia  Encourage ambulation Mother at bedside and updated      All the records are reviewed and case discussed with Care Management/Social Workerr. Management plans discussed with the patient, family and they are in agreement.  CODE STATUS: Full Code  TOTAL TIME TAKING CARE OF THIS PATIENT: 42 minutes.   POSSIBLE D/C IN 3 DAYS, DEPENDING ON CLINICAL CONDITION.   Gladstone Lighter M.D on 12/08/2017 at 8:27 PM  Between 7am to 6pm - Pager - [phone]  After 6pm go to [email or web address] - password [email or web address] [facility]  Office  [phone]  CC: Primary care physician; Patient, No Pcp Per

## 2017-12-08 NOTE — Progress Notes (Signed)
[facility]  Daily Progress Note   Subjective  - * No surgery found *  Patient sleeping more comfortable today no new complaints  Objective Vitals:   12/08/17 0429 12/08/17 1046 12/08/17 1100 12/08/17 1823  BP: 121/66   121/69  Pulse: (!) 119   (!) 107  Resp: 20     Temp: (!) 103.1 F (39.5 C) 98.5 F (36.9 C) 99.2 F (37.3 C)   TempSrc: Oral Oral Oral   SpO2: 94%   92%  Weight: 122.4 kg     Height: 6\' 2"  (1.88 m)       Intake/Output Summary (Last 24 hours) at 12/08/2017 1903 Last data filed at 12/08/2017 1639 Gross per 24 hour  Intake 2127.79 ml  Output -  Net 2127.79 ml    PULM  CTAB CV  RRR VASC  bilateral lower extremity edema about the same right leg is larger than the left  Laboratory CBC    Component Value Date/Time   WBC 6.3 12/08/2017 0412   HGB 7.3 (L) 12/08/2017 0412   HCT 21.3 (L) 12/08/2017 0412   PLT 26 (LL) 12/08/2017 0412    BMET    Component Value Date/Time   NA 133 (L) 12/08/2017 0412   K 3.7 12/08/2017 0412   CL 102 12/08/2017 0412   CO2 25 12/08/2017 0412   GLUCOSE 111 (H) 12/08/2017 0412   BUN 12 12/08/2017 0412   CREATININE 0.98 12/08/2017 0412   CALCIUM 8.4 (L) 12/08/2017 0412   GFRNONAA >60 12/08/2017 0412   GFRAA >60 12/08/2017 6945    Assessment/Planning:    Left lower extremity DVT status post intervention associated with metastatic testicular cancer.    I have personally reviewed both the CT scan as well as the images for the ultrasound.  CT does not show any major venous obstructions his intervention is patent IVC appears patent and the filter is well-positioned.  Stents are well approximated.  The ultrasound images demonstrate contracted heterogeneous material that is partially occlusive and this is most characteristic with chronic DVT.  Since his intervention he has had residual chronic thrombotic material noted in the left common femoral vein and this is not new.  Given his thrombocytopenia the  filter I feel comfortable keeping him off anticoagulation at this time.  I have discussed this with Dr. Mike Gip.  I have recommended that we continue supportive care we can reinitiate Xarelto when his thrombocytopenia has resolved.  In the interim his venous reconstruction appears patent and he has an IVC filter to prevent pulmonary embolism.  Hortencia Pilar  12/08/2017, 7:03 PM

## 2017-12-09 ENCOUNTER — Other Ambulatory Visit: Payer: Self-pay | Admitting: Hematology and Oncology

## 2017-12-09 DIAGNOSIS — L989 Disorder of the skin and subcutaneous tissue, unspecified: Secondary | ICD-10-CM

## 2017-12-09 DIAGNOSIS — Z9221 Personal history of antineoplastic chemotherapy: Secondary | ICD-10-CM

## 2017-12-09 DIAGNOSIS — Z86718 Personal history of other venous thrombosis and embolism: Secondary | ICD-10-CM

## 2017-12-09 DIAGNOSIS — Z95820 Peripheral vascular angioplasty status with implants and grafts: Secondary | ICD-10-CM

## 2017-12-09 DIAGNOSIS — L986 Other infiltrative disorders of the skin and subcutaneous tissue: Secondary | ICD-10-CM | POA: Diagnosis not present

## 2017-12-09 DIAGNOSIS — T451X5S Adverse effect of antineoplastic and immunosuppressive drugs, sequela: Secondary | ICD-10-CM

## 2017-12-09 DIAGNOSIS — Z8547 Personal history of malignant neoplasm of testis: Secondary | ICD-10-CM

## 2017-12-09 DIAGNOSIS — R509 Fever, unspecified: Secondary | ICD-10-CM

## 2017-12-09 DIAGNOSIS — Z9079 Acquired absence of other genital organ(s): Secondary | ICD-10-CM

## 2017-12-09 LAB — CBC WITH DIFFERENTIAL/PLATELET
Basophils Absolute: 0 10*3/uL (ref 0–0.1)
Basophils Relative: 0 %
Eosinophils Absolute: 0 10*3/uL (ref 0–0.7)
Eosinophils Relative: 0 %
HCT: 19.4 % — ABNORMAL LOW (ref 40.0–52.0)
Hemoglobin: 6.8 g/dL — ABNORMAL LOW (ref 13.0–18.0)
Lymphocytes Relative: 7 %
Lymphs Abs: 0.5 10*3/uL — ABNORMAL LOW (ref 1.0–3.6)
MCH: 32.2 pg (ref 26.0–34.0)
MCHC: 34.9 g/dL (ref 32.0–36.0)
MCV: 92.1 fL (ref 80.0–100.0)
Monocytes Absolute: 0.7 10*3/uL (ref 0.2–1.0)
Monocytes Relative: 10 %
Neutro Abs: 6 10*3/uL (ref 1.4–6.5)
Neutrophils Relative %: 83 %
Platelets: 31 10*3/uL — ABNORMAL LOW (ref 150–440)
RBC: 2.1 MIL/uL — ABNORMAL LOW (ref 4.40–5.90)
RDW: 15.8 % — ABNORMAL HIGH (ref 11.5–14.5)
WBC: 7.2 10*3/uL (ref 3.8–10.6)

## 2017-12-09 LAB — PREPARE RBC (CROSSMATCH)

## 2017-12-09 LAB — BASIC METABOLIC PANEL
Anion gap: 7 (ref 5–15)
BUN: 10 mg/dL (ref 6–20)
CALCIUM: 8.1 mg/dL — AB (ref 8.9–10.3)
CHLORIDE: 97 mmol/L — AB (ref 98–111)
CO2: 27 mmol/L (ref 22–32)
CREATININE: 0.9 mg/dL (ref 0.61–1.24)
GFR calc Af Amer: >60 mL/min (ref >60)
GFR calc non Af Amer: >60 mL/min (ref >60)
GLUCOSE: 112 mg/dL — AB (ref 70–99)
Potassium: 3.6 mmol/L (ref 3.5–5.1)
Sodium: 131 mmol/L — ABNORMAL LOW (ref 135–145)

## 2017-12-09 LAB — VANCOMYCIN, TROUGH: Vancomycin Tr: 11 ug/mL — ABNORMAL LOW (ref 15–20)

## 2017-12-09 LAB — URINE CULTURE
Culture: NO GROWTH
SPECIAL REQUESTS: NORMAL

## 2017-12-09 LAB — MAGNESIUM: Magnesium: 1.8 mg/dL (ref 1.7–2.4)

## 2017-12-09 MED ORDER — SODIUM CHLORIDE 0.9% FLUSH
10.0000 mL | INTRAVENOUS | Status: DC | PRN
  Administered 2017-12-11: 10:00:00 10 mL
  Filled 2017-12-09: qty 40

## 2017-12-09 MED ORDER — SODIUM CHLORIDE 0.9% IV SOLUTION: Freq: Once | INTRAVENOUS | Status: DC

## 2017-12-09 MED ORDER — CLONAZEPAM 1 MG PO TBDP: 1.0000 mg | ORAL_TABLET | Freq: Two times a day (BID) | ORAL | Status: DC

## 2017-12-09 MED ORDER — METHYLPREDNISOLONE SODIUM SUCC 125 MG IJ SOLR
80.0000 mg | INTRAMUSCULAR | Status: DC
  Administered 2017-12-09 – 2017-12-10 (×2): 80 mg via INTRAVENOUS
  Filled 2017-12-09 (×2): qty 2

## 2017-12-09 MED ORDER — VANCOMYCIN HCL 10 G IV SOLR
1500.0000 mg | Freq: Three times a day (TID) | INTRAVENOUS | Status: DC
  Administered 2017-12-09 – 2017-12-10 (×4): 1500 mg via INTRAVENOUS
  Filled 2017-12-09 (×5): qty 1500

## 2017-12-09 MED ORDER — CLONAZEPAM 0.25 MG PO TBDP
0.5000 mg | ORAL_TABLET | Freq: Two times a day (BID) | ORAL | Status: DC
  Administered 2017-12-09 – 2017-12-11 (×5): 0.5 mg via ORAL
  Filled 2017-12-09 (×5): qty 2

## 2017-12-09 NOTE — Progress Notes (Addendum)
Edgar [name] is a 27 y.o. male with testicular cancer is admitted with fever from [place] office Pt took his  chemo 11/22/17-11/26/17 and was DC on 9/14. He went to his Onc office on 9/16 with pain rt foot at the site of prior injury sustained by FB piercing his skin . He was given pegfilgrastim-cbqv on 11/29/17 He was seen by podiatrist on 9/19 as well and no infection noted . Xray had minute radioopaque densities. Was asked to keep an eye on the foot. He had gone back to his Oncologist office today and noted to have fever of 102. Pt did not feel any temp. He has left lower quadrant pain for some time worse since xarelto was stopped as he had low platelets  He has a complicated medical History   h/o testicular cancer diagnosed in sept 2017 s/p left orchidectomys/p 3 cycles of BEP chemo , recurrence in June 2019, started TIP chemo, July had DVT of the left leg with severe venous edema ,blistering lesions on his left leg,and underwent percutaneous transluminal angioplasty and mechanical thrombectomy of theleft common femoral, external iliac and common iliac veinsand stent placement of the left common iliac vein and left external iliac vein. Left leg cellulitis and lesions on his left thigh In Aug after his cycle of chemo he was admitted with fever and skin lesions and underwent biopsy which showed neutrophilic dermatosis Was treated with IV antibiotics and went on Po amoxicillin  Subjective Fever persist New skin lesions appearing Seen by derm and biopsied OBJECTIVE: Patient Vitals for the past 24 hrs:  BP Temp Temp src Pulse Resp SpO2  12/09/17 1404 - (!) 102.6 F (39.2 C) Axillary - - -  12/09/17 1228 - (!) 100.4 F (38 C) - - - -  12/09/17 0845 - (!) 102.1 F (38.9 C) - - - -  12/09/17 0326 119/74 99.6 F (37.6 C) Oral (!) 103 19 91 %  12/08/17 1916 115/66 99.7 F (37.6 C) Oral (!) 105 18 99 %    Physical Exam Constitutional: He isoriented to person, place, and time. He  appearswell-developed.No distress.  Head:Normocephalic.  Mouth/Throat:Oropharynx is clear and moist.  Eyes:Pupils are equal, round, and reactive to light.  Neck:Neck supple.  Cardiovascular:Intact distal pulses. No murmurheard. S1s2, tachycardia Port site okay Pulmonary/Chest: wheeze base, b/l air entry  Abdominal:Soft. Tenderness left lower quadrant Musculoskeletal:  B/l leg swelling Left thigh 3 circular areas of healing skin tenderness over the rt met head area- no erythema or wound Lymphadenopathy:  None felt  Neurological: He isalertand oriented to person, place, and time. Nocranial nerve deficit.  Skin: Skin iswarm.  Psychiatric: He has anormal mood and affect. skin Healing circular lesions left thigh with no erythema Or induration  Scabbing wound rt ear        Lab Results CBC Latest Ref Rng & Units 12/09/2017 12/08/2017 12/07/2017  WBC 3.8 - 10.6 K/uL 7.2 6.3 7.1  Hemoglobin 13.0 - 18.0 g/dL 6.8(L) 7.3(L) 7.3(L)  Hematocrit 40.0 - 52.0 % 19.4(L) 21.3(L) 21.6(L)  Platelets 150 - 440 K/uL 31(L) 26(LL) 26(LL)   CMP Latest Ref Rng & Units 12/09/2017 12/08/2017 12/07/2017  Glucose 70 - 99 mg/dL 112(H) 111(H) 106(H)  BUN 6 - 20 mg/dL 10 12 19   Creatinine 0.61 - 1.24 mg/dL 0.90 0.98 1.00  Sodium 135 - 145 mmol/L 131(L) 133(L) 130(L)  Potassium 3.5 - 5.1 mmol/L 3.6 3.7 4.3  Chloride 98 - 111 mmol/L 97(L) 102 99  CO2 22 - 32 mmol/L 27  25 23  Calcium 8.9 - 10.3 mg/dL 8.1(L) 8.4(L) 8.8(L)  Total Protein 6.5 - 8.1 g/dL - - 6.4(L)  Total Bilirubin 0.3 - 1.2 mg/dL - - 0.4  Alkaline Phos 38 - 126 U/L - - 48  AST 15 - 41 U/L - - 12(L)  ALT 0 - 44 U/L - - 12        Microbiology: Blood culture sent Radiographs and labs were personally reviewed by me.   Assessment and Plan 27 y.o.malewith h/o testicular cancer diagnosed in sept 2017 s/p left orchidectomy  recurrence in June 2019, started TIP chemo, July had DVT of the left leg with  severe venous edema ,blistering lesions on his left leg,and underwent percutaneous transluminal angioplasty and mechanical thrombectomy of theleft common femoral, external iliac and common iliac veinsand stent placement of the left common iliac vein and left external iliac vein.After chemo in Aug developed fever and skin lesions, had biopsy and was neurophilic dermatitis Now admitted with fever 9 days after his cycle. 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 Follow up MRI rt foot continue vanco/cefepime and flagyl    new skin lesions- starting Previous 2 cycles were associated with skin lesions and was treated as infection  R/o sweets syndrome from GCSF   Discussed the management with patient and Dr.Corcoran

## 2017-12-09 NOTE — Consult Note (Signed)
Pharmacy Antibiotic Note  Edgar [name] is a 27 y.o. male admitted on 12/07/2017 with Febrile Neutropenia.  Pharmacy has been consulted for Vancomycin + Cefepime dosing.  Plan: Patient has testicular cancer and is currently receiving cycle #3 of TIP chemo, day 16. Was admitted last month and treated for cellulitis.  09/26 @ 0300 VT 11 subtherapeutic, drawn one hour early true trough around 12 mcg/mL. Will increase dose to vanc 1.5g IV q8h and will check a trough 09/27 @ 0330 prior to 4th dose. Renal function improving Scr 1.00 >> 0.98 >> 0.90, will continue to monitor.  Height: 6\' 2"  (188 cm) Weight: 269 lb 12.8 oz (122.4 kg) IBW/kg (Calculated) : 82.2  Temp (24hrs), Avg:100 F (37.8 C), Min:98.5 F (36.9 C), Max:103.1 F (39.5 C)  Recent Labs  Lab 12/02/17 0833 12/03/17 0858 12/06/17 1145 12/07/17 1144 12/08/17 0412 12/09/17 0253  WBC 0.7* 0.4* 6.8 7.1 6.3 7.2  CREATININE 0.69  --   --  1.00 0.98 0.90  VANCOTROUGH  --   --   --   --   --  11*    Estimated Creatinine Clearance: 168.4 mL/min (by C-G formula based on SCr of 0.9 mg/dL).    No Known Allergies  Antimicrobials this admission: 0924 Cefepime >>  0924  Vancomycin >>  0924 Flagyl  >>   Microbiology results: 0924 BCx: ordered 0924 UCx: ordered   Pharmacy will continue to monitor.  Thank you for allowing pharmacy to be a part of this patient's care.  Tobie Lords, PharmD, BCPS Clinical Pharmacist 12/09/2017

## 2017-12-09 NOTE — Progress Notes (Signed)
[place] [facility] [place] NAME: Edgar [name]    MR#:  [id number]  DATE OF BIRTH:  1990-07-24  SUBJECTIVE:  CHIEF COMPLAINT:  No chief complaint on file.  -Hemoglobin dropped again to 6.8, spiking fevers again.  So no transfusion being noted -Complains of anxiety. -Worsening rash with an open wound developed on left calf muscle  REVIEW OF SYSTEMS:  Review of Systems  Constitutional: Positive for fever and malaise/fatigue. Negative for chills.  HENT: Negative for congestion, ear discharge, hearing loss and nosebleeds.   Respiratory: Negative for cough, shortness of breath and wheezing.   Cardiovascular: Negative for chest pain and palpitations.  Gastrointestinal: Positive for nausea and vomiting. Negative for abdominal pain, constipation and diarrhea.  Genitourinary: Negative for dysuria.  Musculoskeletal: Positive for myalgias.  Skin: Positive for rash.  Neurological: Negative for dizziness, focal weakness, seizures, weakness and headaches.  Psychiatric/Behavioral: Negative for depression. The patient is nervous/anxious.     DRUG ALLERGIES:  No Known Allergies  VITALS:  Blood pressure 119/74, pulse (!) 103, temperature (!) 100.4 F (38 C), resp. rate 19, height 6\' 2"  (1.88 m), weight 122.4 kg, SpO2 91 %.  PHYSICAL EXAMINATION:  Physical Exam  GENERAL:  27 y.o.-year-old patient lying in the bed with no acute distress.  EYES: Pupils equal, round, reactive to light and accommodation. No scleral icterus. Extraocular muscles intact.  HEENT: Head atraumatic, normocephalic. Oropharynx and nasopharynx clear.  NECK:  Supple, no jugular venous distention. No thyroid enlargement, no tenderness.  LUNGS: Normal breath sounds bilaterally, no wheezing, rales,rhonchi or crepitation. No use of accessory muscles of respiration.  CARDIOVASCULAR: S1, S2 normal. No murmurs, rubs, or gallops.  ABDOMEN: Soft, nontender, nondistended. Bowel sounds present.  No organomegaly or mass.  EXTREMITIES: bilateral lower extremity edema left> right, petechiae noted. no cyanosis, or clubbing.  NEUROLOGIC: Cranial nerves II through XII are intact. Muscle strength 5/5 in all extremities. Sensation intact. Gait not checked.  PSYCHIATRIC: The patient is alert and oriented x 3.  SKIN: Right earlobe lesion noted.  There is a open wound on the left calf and also a small wound in the right leg that has been biopsied   LABORATORY PANEL:   CBC Recent Labs  Lab 12/09/17 0253  WBC 7.2  HGB 6.8*  HCT 19.4*  PLT 31*   ------------------------------------------------------------------------------------------------------------------  Chemistries  Recent Labs  Lab 12/07/17 1144  12/09/17 0253  NA 130*   < > 131*  K 4.3   < > 3.6  CL 99   < > 97*  CO2 23   < > 27  GLUCOSE 106*   < > 112*  BUN 19   < > 10  CREATININE 1.00   < > 0.90  CALCIUM 8.8*   < > 8.1*  MG 1.4*   < > 1.8  AST 12*  --   --   ALT 12  --   --   ALKPHOS 48  --   --   BILITOT 0.4  --   --    < > = values in this interval not displayed.   ------------------------------------------------------------------------------------------------------------------  Cardiac Enzymes No results for input(s): TROPONINI in the last 168 hours. ------------------------------------------------------------------------------------------------------------------  RADIOLOGY:  Dg Chest 2 View  Result Date: 12/08/2017 CLINICAL DATA:  Fever EXAM: CHEST - 2 VIEW COMPARISON:  11/04/2017 chest radiograph. FINDINGS: Right internal jugular MediPort terminates at the cavoatrial junction. Stable cardiomediastinal silhouette with normal heart size. No pneumothorax. No pleural effusion. Lungs  appear clear, with no acute consolidative airspace disease and no pulmonary edema. IMPRESSION: No active cardiopulmonary disease. Electronically Signed   By: Ilona Sorrel M.D.   On: 12/08/2017 01:56   Ct Abdomen Pelvis W  Contrast  Result Date: 12/08/2017 CLINICAL DATA:  Recurrent testicular cancer. Most recent chemotherapy a few weeks prior. Lower extremity swelling. Recent discontinuation of Xarelto anticoagulation due to thrombocytopenia. Left lower quadrant abdominal pain. EXAM: CT ABDOMEN AND PELVIS WITH CONTRAST TECHNIQUE: Multidetector CT imaging of the abdomen and pelvis was performed using the standard protocol following bolus administration of intravenous contrast. CONTRAST:  171mL ISOVUE-300 IOPAMIDOL (ISOVUE-300) INJECTION 61% COMPARISON:  11/05/2017 CT abdomen/pelvis. FINDINGS: Lower chest: No significant pulmonary nodules or acute consolidative airspace disease. Tip of superior approach central venous catheter is seen at the cavoatrial junction. Hepatobiliary: Normal liver size. No liver mass. Normal gallbladder with no radiopaque cholelithiasis. No biliary ductal dilatation. Pancreas: Normal, with no mass or duct dilation. Spleen: Normal size. No mass. Adrenals/Urinary Tract: Normal adrenals. Normal size kidneys with no renal masses. Mild fullness of the central right renal collecting system without overt right hydronephrosis. No left hydronephrosis. Normal bladder. Stomach/Bowel: Normal non-distended stomach. A few mildly dilated small bowel loops in the left abdomen measuring up to 3.3 cm diameter. No focal small bowel caliber transition. No small bowel wall thickening. Oral contrast transits to the mid small bowel. Normal appendix. Normal large bowel with no diverticulosis, large bowel wall thickening or pericolonic fat stranding. Vascular/Lymphatic: Normal caliber abdominal aorta. Patent hepatic, portal, splenic and renal veins. IVC filter is in place below the level of the renal veins. Left common and external iliac venous stent is in place and appears patent. No evidence of venous thrombosis. Mildly enlarged left inguinal nodes up to 1.2 cm (series 2/image 95), stable since 11/05/2017 CT. Enlarged 2.3 cm left  external iliac node (series 2/image 80), previously 2.4 cm using similar measurement technique, stable. Left common iliac 1.2 cm enlarged node (series 2/image 65), previously 1.6 cm, mildly decreased. Mild left para-aortic adenopathy up to 1.1 cm (series 2/image 57), previously 1.2 cm using similar measurement technique, not appreciably changed. No new pathologically enlarged abdominopelvic nodes. Reproductive: Normal size prostate. Other: No pneumoperitoneum, ascites or focal fluid collection. Fat stranding throughout the left inguinal region is not significantly changed. Musculoskeletal: No aggressive appearing focal osseous lesions. IMPRESSION: 1. Left para-aortic, left common iliac, left external iliac and left inguinal lymphadenopathy is stable to mildly decreased since 11/05/2017 CT. No new or progressive metastatic disease. 2. No acute vascular abnormality. IVC filter and left iliac venous stent graft are in place. 3. Persistent postsurgical fat stranding throughout the left inguinal region without fluid collection. Electronically Signed   By: Ilona Sorrel M.D.   On: 12/08/2017 01:55   US Venous Img Lower Bilateral  Addendum Date: 12/08/2017   ADDENDUM REPORT: 12/08/2017 12:04 ADDENDUM: There is an error in the report: Nonocclusive thrombus is in the left common femoral vein. Electronically Signed   By: Aletta Edouard M.D.   On: 12/08/2017 12:04   Result Date: 12/08/2017 CLINICAL DATA:  Bilateral lower extremity edema. EXAM: BILATERAL LOWER EXTREMITY VENOUS DOPPLER ULTRASOUND TECHNIQUE: Gray-scale sonography with graded compression, as well as color Doppler and duplex ultrasound were performed to evaluate the lower extremity deep venous systems from the level of the common femoral vein and including the common femoral, femoral, profunda femoral, popliteal and calf veins including the posterior tibial, peroneal and gastrocnemius veins when visible. The superficial great saphenous vein was  also  interrogated. Spectral Doppler was utilized to evaluate flow at rest and with distal augmentation maneuvers in the common femoral, femoral and popliteal veins. COMPARISON:  11/05/2017 FINDINGS: RIGHT LOWER EXTREMITY Common Femoral Vein: No evidence of thrombus. Normal compressibility, respiratory phasicity and response to augmentation. Saphenofemoral Junction: No evidence of thrombus. Normal compressibility and flow on color Doppler imaging. Profunda Femoral Vein: No evidence of thrombus. Normal compressibility and flow on color Doppler imaging. Femoral Vein: No evidence of thrombus. Normal compressibility, respiratory phasicity and response to augmentation. Popliteal Vein: No evidence of thrombus. Normal compressibility, respiratory phasicity and response to augmentation. Calf Veins: No evidence of thrombus. Normal compressibility and flow on color Doppler imaging. Superficial Great Saphenous Vein: No evidence of thrombus. Normal compressibility. Venous Reflux:  None. Other Findings: No evidence of superficial thrombophlebitis or abnormal fluid collection. LEFT LOWER EXTREMITY Common Femoral Vein: There is evidence of new nonocclusive thrombus in the left common femoral artery along the anterior wall measuring approximately 3-5 mm in thickness. This does not create flow limitation with good flow seen in the lumen below the level of thrombus. However, compared to the prior study just 1 month ago, this finding is new and consistent with acute/subacute thrombus. Saphenofemoral Junction: No evidence of thrombus. Normal compressibility and flow on color Doppler imaging. Profunda Femoral Vein: No evidence of thrombus. Normal compressibility and flow on color Doppler imaging. Femoral Vein: No evidence of thrombus. Normal compressibility, respiratory phasicity and response to augmentation. Popliteal Vein: No evidence of thrombus. Normal compressibility, respiratory phasicity and response to augmentation. Calf Veins: No  evidence of thrombus. Normal compressibility and flow on color Doppler imaging. Superficial Great Saphenous Vein: No evidence of thrombus. Normal compressibility. Venous Reflux:  None. Other Findings: No evidence of superficial thrombophlebitis or abnormal fluid collection. IMPRESSION: 1. New nonocclusive thrombus in the left common femoral artery along the anterior wall measuring 3-5 mm in thickness. This is not flow limiting but is new since the prior study on 11/05/2017 and consistent with acute/subacute thrombus. 2. No evidence of right lower extremity deep vein thrombosis. Electronically Signed: By: Aletta Edouard M.D. On: 12/08/2017 11:12    EKG:   Orders placed or performed in visit on 12/07/17  . EKG 12-Lead  . EKG 12-Lead  . EKG 12-Lead    ASSESSMENT AND PLAN:   27 y/o M with PMH significant for testicular cancer on chemo, h/o DVT s/p IVC, sathma, gerd admitted with fevers.  1. 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 Left leg DVT- unable to anticoagulate due to anemia and thrombocytopenia - s/p IVC - vascular consult appreciated- likely chronic clot - xarelto as outpt whenever counts improve  3. Anemia and thrombocytopenia- from chemo -transfuse Prbc one fevers resolve, hb down to 6.8- will need 1 unit atleast to be transfused  4. Testicular cancer- mgmt per oncology - s/p 3 cycles of chemo -Complains of significant anxiety due to everything going on.  Started on Klonopin  5. DVT prophylaxis- TEDs and SCDs only due to anemia and thrombocytopenia  Encourage ambulation    All the records are reviewed and case discussed with Care Management/Social  Workerr. Management plans discussed with the patient,  family and they are in agreement.  CODE STATUS: Full Code  TOTAL TIME TAKING CARE OF THIS PATIENT: 72minutes.   POSSIBLE D/C IN 3 DAYS, DEPENDING ON CLINICAL CONDITION.   Gladstone Lighter M.D on 12/09/2017 at 1:45 PM  Between 7am to 6pm - Pager - [phone]  After 6pm go to [email or web address] - password [email or web address] [facility]  Office  [phone]  CC: Primary care physician; Patient, No Pcp Per

## 2017-12-10 DIAGNOSIS — Z95820 Peripheral vascular angioplasty status with implants and grafts: Secondary | ICD-10-CM

## 2017-12-10 DIAGNOSIS — Z86718 Personal history of other venous thrombosis and embolism: Secondary | ICD-10-CM

## 2017-12-10 DIAGNOSIS — Z9889 Other specified postprocedural states: Secondary | ICD-10-CM

## 2017-12-10 DIAGNOSIS — D696 Thrombocytopenia, unspecified: Secondary | ICD-10-CM

## 2017-12-10 DIAGNOSIS — R509 Fever, unspecified: Secondary | ICD-10-CM

## 2017-12-10 DIAGNOSIS — Z9221 Personal history of antineoplastic chemotherapy: Secondary | ICD-10-CM

## 2017-12-10 DIAGNOSIS — L989 Disorder of the skin and subcutaneous tissue, unspecified: Secondary | ICD-10-CM

## 2017-12-10 DIAGNOSIS — L982 Febrile neutrophilic dermatosis [Sweet]: Secondary | ICD-10-CM

## 2017-12-10 DIAGNOSIS — Z8547 Personal history of malignant neoplasm of testis: Secondary | ICD-10-CM

## 2017-12-10 DIAGNOSIS — D649 Anemia, unspecified: Secondary | ICD-10-CM

## 2017-12-10 DIAGNOSIS — Z9079 Acquired absence of other genital organ(s): Secondary | ICD-10-CM

## 2017-12-10 LAB — BPAM RBC
Blood Product Expiration Date: 201910142359
Unit Type and Rh: 6200

## 2017-12-10 LAB — C-REACTIVE PROTEIN: CRP: 26.9 mg/dL — ABNORMAL HIGH (ref <1.0)

## 2017-12-10 LAB — CBC WITH DIFFERENTIAL/PLATELET
Basophils Absolute: 0 10*3/uL (ref 0–0.1)
Basophils Relative: 0 %
Eosinophils Absolute: 0 10*3/uL (ref 0–0.7)
Eosinophils Relative: 0 %
HCT: 20.5 % — ABNORMAL LOW (ref 40.0–52.0)
Hemoglobin: 7.2 g/dL — ABNORMAL LOW (ref 13.0–18.0)
Lymphocytes Relative: 2 %
Lymphs Abs: 0.2 10*3/uL — ABNORMAL LOW (ref 1.0–3.6)
MCH: 32.8 pg (ref 26.0–34.0)
MCHC: 34.9 g/dL (ref 32.0–36.0)
MCV: 94 fL (ref 80.0–100.0)
Monocytes Absolute: 0.4 10*3/uL (ref 0.2–1.0)
Monocytes Relative: 4 %
Neutro Abs: 9.5 10*3/uL — ABNORMAL HIGH (ref 1.4–6.5)
Neutrophils Relative %: 94 %
Platelets: 42 10*3/uL — ABNORMAL LOW (ref 150–440)
RBC: 2.18 MIL/uL — ABNORMAL LOW (ref 4.40–5.90)
RDW: 16.4 % — ABNORMAL HIGH (ref 11.5–14.5)
WBC: 10.1 10*3/uL (ref 3.8–10.6)

## 2017-12-10 LAB — BASIC METABOLIC PANEL
Anion gap: 7 (ref 5–15)
BUN: 11 mg/dL (ref 6–20)
CO2: 28 mmol/L (ref 22–32)
Calcium: 8.3 mg/dL — ABNORMAL LOW (ref 8.9–10.3)
Chloride: 104 mmol/L (ref 98–111)
Creatinine, Ser: 0.95 mg/dL (ref 0.61–1.24)
GFR calc Af Amer: >60 mL/min (ref >60)
GFR calc non Af Amer: >60 mL/min (ref >60)
Glucose, Bld: 144 mg/dL — ABNORMAL HIGH (ref 70–99)
Potassium: 3.8 mmol/L (ref 3.5–5.1)
Sodium: 139 mmol/L (ref 135–145)

## 2017-12-10 LAB — TYPE AND SCREEN
ABO/RH(D): A POS
ANTIBODY SCREEN: NEGATIVE
UNIT DIVISION: 0

## 2017-12-10 LAB — SEDIMENTATION RATE: Sed Rate: 127 mm/hr — ABNORMAL HIGH (ref 0–15)

## 2017-12-10 LAB — VANCOMYCIN, TROUGH: Vancomycin Tr: 13 ug/mL — ABNORMAL LOW (ref 15–20)

## 2017-12-10 LAB — PREPARE RBC (CROSSMATCH)

## 2017-12-10 LAB — MAGNESIUM: Magnesium: 2 mg/dL (ref 1.7–2.4)

## 2017-12-10 MED ORDER — SODIUM CHLORIDE 0.9% IV SOLUTION
Freq: Once | INTRAVENOUS | Status: AC
  Administered 2017-12-10: 14:00:00 via INTRAVENOUS

## 2017-12-10 MED ORDER — VANCOMYCIN HCL IN DEXTROSE 1-5 GM/200ML-% IV SOLN
1000.0000 mg | Freq: Four times a day (QID) | INTRAVENOUS | Status: DC
  Filled 2017-12-10 (×3): qty 200

## 2017-12-10 MED ORDER — VANCOMYCIN HCL 10 G IV SOLR
1250.0000 mg | Freq: Three times a day (TID) | INTRAVENOUS | Status: DC
  Administered 2017-12-10 – 2017-12-11 (×3): 1250 mg via INTRAVENOUS
  Filled 2017-12-10 (×5): qty 1250

## 2017-12-10 NOTE — Plan of Care (Signed)

## 2017-12-10 NOTE — Consult Note (Signed)
HPI:  Patient is a 27yo male with history of recurrent testicular cancer currently being treated with chemotherapy, recently admitted for sepsis.  Patient reports that since starting chemotherapy he has had repeated episodes of fever and skin lesions on his lower extremities.  First episode was on his left thigh.  Second episode his left calf.  And now he has lesions on his right leg.  The lesions starts are red tender areas that develop blisters and leave scars after healing.  He received granulocyte stimulating factor after each of his 5 day chemotherapy cycles because he ANC will nadir at 0.  PMH: Testicular cancer  MED:  clonazePAM  0.5 mg Oral BID   . DULoxetine  60 mg Oral Daily  . gabapentin  300 mg Oral QHS  . loratadine  10 mg Oral Daily  . magnesium oxide  400 mg Oral Daily  . methylPREDNISolone (SOLU-MEDROL) injection  80 mg Intravenous Q24H  . nicotine  21 mg Transdermal Daily  . potassium chloride SA  20 mEq Oral BID     ALL:No Known Allergies  SH: Accompanied by friends at the hospital.  ROS: Denies joint aches.  PE: Gen: Febrile. Skin: Examination of the scalp, face, neck, chest, abdomen, back, bilateral upper and lower extremities was performed and notable for: Atrophic cribiform plaques on left thigh. Erythematous tender plaque with central necrosis and bullae on left calf. Several tender erythematous plaques on right lower leg. Lymph nodes: Examination of the inguinal lymph nodes was without lymphadenopathy.  Assessment and Plan: Patient's presentation, history, previous pathology, and labs are consistent with a neutrophilic dermatosis, specifically Sweet syndrome or pyoderma gangrenosum.  Granulocyte colony stimulating factor is the most commonly reported medication for drug-induced Sweet syndrome.  The timing of the patient's fevers and skin lesions points towards drug induced Sweet Syndrome.  However, Sweet syndrome can be associated with solid cancers including  testicular cancer regardless of treatment.  The differential diagnosis includes metastatic carcinoma, panniculitis such as erythema nodosum, or infectious etiologies which seem unlikely in this case given the negative infectious disease work-up and cyclic nature of the fevers and skin lesions after chemotherapy. 3 mm punch biopsy of a new lesion on the right leg was performed today.  Wound care- Vaseline and a bandage changed daily until healed (1-2 weeks). Continue to follow cultures to ensure they are negative. Recommend prednisone 1-2 mg/kg daily.  If taking 2mg /kg can split in two doses.  Continue at this dose until improved and then slowly taper. For long term management for additional rounds of chemotherapy necessitating granulocyte stimulating factor, can consider steroid-sparing agents including: colchicine 1.5mg  daily, dapsone 100-200mg  daily, or potassium iodide 900mg /day. I am happy to continue to follow the patient after discharge.  Please call the office at [phone] to arrange an appointment.

## 2017-12-10 NOTE — Progress Notes (Signed)
Edgar Panepinto Phillipsis a 27 y.o.malewith testicular cancer is admitted with feverfrom ONc office Pt took his chemo 11/22/17-11/26/17 and was DC on 9/14. He went to his Onc office on 9/16 with pain rt foot at the site of prior injury sustained by FB piercing his skin . He was given pegfilgrastim-cbqv on 11/29/17 He was seen by podiatrist on 9/19 as well and no infection noted . Xray had minute radioopaque densities. Was asked to keep an eye on the foot. He had gone back to his Oncologist office today and noted to have fever of 102. Pt did not feel any temp. He has left lower quadrant pain for some time worse since xarelto was stopped as he had low platelets  He has a complicated medical History  h/o testicular cancer diagnosed in sept 2017 s/p left orchidectomys/p 3 cycles of BEP chemo , recurrence in June 2019, started TIP chemo, July had DVT of the left leg with severe venous edema ,blistering lesions on his left leg,and underwent percutaneous transluminal angioplasty and mechanical thrombectomy of theleft common femoral, external iliac and common iliac veinsand stent placement of the left common iliac vein and left external iliac vein. Left leg cellulitis and lesions on his left thigh In Aug after his cycle of chemo he was admitted with fever and skin lesions and underwent biopsy which showed neutrophilic dermatosis Was treated with IV antibiotics and went on Po amoxicillin  Subjective Doing better Since starting seteroids No fever No new lesions  OBJECTIVE: Patient Vitals for the past 24 hrs:  BP Temp Temp src Pulse Resp SpO2  12/10/17 0432 114/82 97.6 F (36.4 C) Oral 84 15 99 %  12/09/17 2317 - 98.2 F (36.8 C) Oral - - -  12/09/17 2000 98/70 98.2 F (36.8 C) Oral (!) 107 14 96 %  12/09/17 1404 - (!) 102.6 F (39.2 C) Axillary - - -  12/09/17 1228 - (!) 100.4 F (38 C) - - - -   Physical Exam Constitutional: He isoriented to person, place, and time. He  appearswell-developed.No distress.  Head:Normocephalic.  Mouth/Throat:Oropharynx is clear and moist.  Eyes:Pupils are equal, round, and reactive to light.  Neck:Neck supple.  Cardiovascular:Intact distal pulses. No murmurheard. S1s2,  Port site okay Pulmonary/Chest:wheeze base, b/l air entry Abdominal:Soft.Tenderness left lower quadrant Musculoskeletal: B/l leg swelling Left thigh 3 circular areas of healing skin tenderness over the rt met head area- no erythema or wound Lymphadenopathy: None felt Neurological: He isalertand oriented to person, place, and time. Nocranial nerve deficit.  Skin: Skin iswarm.  Psychiatric: He has anormal mood and affect. skin Healing circular lesions left thigh with no erythema Or induration  Scabbing wound rt ear        Lab Results CBC Latest Ref Rng & Units 12/10/2017 12/09/2017 12/08/2017  WBC 3.8 - 10.6 K/uL 10.1 7.2 6.3  Hemoglobin 13.0 - 18.0 g/dL 7.2(L) 6.8(L) 7.3(L)  Hematocrit 40.0 - 52.0 % 20.5(L) 19.4(L) 21.3(L)  Platelets 150 - 440 K/uL 42(L) 31(L) 26(LL)   CMP Latest Ref Rng & Units 12/10/2017 12/09/2017 12/08/2017  Glucose 70 - 99 mg/dL 144(H) 112(H) 111(H)  BUN 6 - 20 mg/dL _0 Creatinine 0.61 - 1.24 mg/dL 0.95 0.90 0.98  Sodium 135 - 145 mmol/L 139 131(L) 133(L)  Potassium 3.5 - 5.1 mmol/L 3.8 3.6 3.7  Chloride 98 - 111 mmol/L 104 97(L) 102  CO2 22 - 32 mmol/L _1 Calcium 8.9 - 10.3 mg/dL 8.3(L) 8.1(L) 8.4(L)  Total Protein 6.5 -  8.1 g/dL - - -  Total Bilirubin 0.3 - 1.2 mg/dL - - -  Alkaline Phos 38 - 126 U/L - - -  AST 15 - 41 U/L - - -  ALT 0 - 44 U/L - - -       Microbiology: Blood culture sent Radiographs and labs were personally reviewed by me.   Assessment and Plan 27 y.o.malewith h/o testicular cancer diagnosed in sept 2017 s/p left orchidectomy  recurrence in June 2019, started TIP chemo, July had DVT of the left leg with severe venous edema  ,blistering lesions on his left leg,and underwent percutaneous transluminal angioplasty and mechanical thrombectomy of theleft common femoral, external iliac and common iliac veinsand stent placement of the left common iliac vein and left external iliac vein.After chemo in Aug developed fever and skin lesions, had biopsy and was neurophilic dermatitis Now admitted with fever 9 days after his cycle. 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

## 2017-12-10 NOTE — Consult Note (Signed)
Pharmacy Antibiotic Note  Edgar [name] is a 27 y.o. male admitted on 12/07/2017 with fever and skin eruptions. Pharmacy has been consulted for Vancomycin + Cefepime dosing.  Today, 12/10/2017  Day 3 vanco/cefepime  Tm 102.6  - per notes concern Fevers may be due to G-CSF  WBC WNL  Cultures unrevealing  Trough Levels:  9/26 02:53 = 11 mcg/mL on 1250mg  IV q8h  9/27 04:13 = 13 mcg/mL on 1500mg  IV q8h  Plan:  Change vancomycin back to previous dose of 1250mg  IV q8h as trough goal in 10-15 mcg/mL for cellulitis.  Goal trough = 10-15 mcg/mL  He achieved this goal with 1250mg  IV q8h previously  Concern for accumulation of vancomycin with higher doses  Height: 6\' 2"  (188 cm) Weight: 269 lb 12.8 oz (122.4 kg) IBW/kg (Calculated) : 82.2  Temp (24hrs), Avg:99.4 F (37.4 C), Min:97.6 F (36.4 C), Max:102.6 F (39.2 C)  Recent Labs  Lab 12/06/17 1145 12/07/17 1144 12/08/17 0412 12/09/17 0253 12/10/17 0413  WBC 6.8 7.1 6.3 7.2 10.1  CREATININE  --  1.00 0.98 0.90 0.95  VANCOTROUGH  --   --   --  11* 13*    Estimated Creatinine Clearance: 159.5 mL/min (by C-G formula based on SCr of 0.95 mg/dL).    No Known Allergies  Antimicrobials this admission: 0924 Cefepime >>  0924  Vancomycin >>  0924 Flagyl  >> 9/26   Microbiology results: 0867 BCx: NGTD [id number] UCx: NG 9/25 Ear: few S. Epidermidis 9/26 BCx: NGTD   Pharmacy will continue to monitor.  Thank you for allowing pharmacy to be a part of this patient's care.  Doreene Eland, PharmD, BCPS.   Work Cell: [phone] 12/10/2017 1:13 PM

## 2017-12-10 NOTE — Progress Notes (Signed)
[facility] Hematology/Oncology Progress Note  Date of admission: 12/07/2017  Hospital day:  12/10/2017   Chief Complaint: Edgar [name] is a 27 y.o. male with recurrent testicular carcinoma currently day 19 s/p cycle #3 TIP who was admitted from the oncology clinic with fever (102.2) and tachycardia.  Subjective:  Feels a little better.  Fever curve improved after steroids started.  LLQ pain, improved.  Nausea controlled with ondansetron.  Skin lesions stable.  Social History: The patient is alone today.  Allergies: No Known Allergies  Scheduled Medications: . sodium chloride   Intravenous Once  . sodium chloride   Intravenous Once  . clonazePAM  0.5 mg Oral BID  . gabapentin  300 mg Oral QHS  . loratadine  10 mg Oral Daily  . magnesium oxide  400 mg Oral Daily  . methylPREDNISolone (SOLU-MEDROL) injection  80 mg Intravenous Q24H  . nicotine  21 mg Transdermal Daily  . potassium chloride SA  20 mEq Oral BID    Review of Systems: GENERAL:  Less fatigued.  Fever gone after institution of steroids.Marland Kitchen PERFORMANCE STATUS (ECOG):  1-2 HEENT:  No visual changes, runny nose, sore throat, mouth sores or tenderness. Lungs: No shortness of breath or cough.  No hemoptysis. Cardiac:  No chest pain, palpitations, orthopnea, or PND. GI:  Nausea, controlled.  No vomiting, diarrhea, constipation, melena or hematochezia. GU:  No urgency, frequency, dysuria, or hematuria. Musculoskeletal:  Right foot "ok" today.  No back pain.  No joint pain.  No muscle tenderness. Extremities:  Improved lower extremity swelling. Skin:  One skin lesion appears better.  No new lesions. Neuro:  No headache, numbness or weakness, balance or coordination issues. Endocrine:  No diabetes, thyroid issues, hot flashes or night sweats. Psych:  No mood changes, depression or anxiety. Pain:  Pain with left calf lesion. Review of systems:  All other systems reviewed and found to be negative.    Physical Exam: Blood pressure 120/71, pulse 85, temperature (!) 97.5 F (36.4 C), temperature source Oral, resp. rate 16, height 6' 2"  (1.88 m), weight 269 lb 12.8 oz (122.4 kg), SpO2 96 %.  GENERAL:  Fatigued appearing gentleman sitting comfortably on the medical unit with his smartphone in no acute distress. MENTAL STATUS:  Alert and oriented to person, place and time. HEAD:  Alopecia.  Normocephalic, atraumatic, face symmetric, no Cushingoid features. EYES:  Brown eyes.  Pupils equal round and reactive to light and accomodation.  No conjunctivitis or scleral icterus. ENT:  Oropharynx clear without lesion.  Tongue normal. Mucous membranes moist.  RESPIRATORY:  Clear to auscultation without rales, wheezes or rhonchi. CARDIOVASCULAR:  Regular rate and rhythm without murmur, rub or gallop. ABDOMEN:  Soft, minimally tender LLQ without guarding or rebound tenderness.  Active bowel sounds, and no hepatosplenomegaly.  No masses. SKIN:  Right lower biopsy site remains wrapped  No change in large left anterior thigh circular lesions.  Left calf less edematous (wrapped).  Left thigh 1-2 cm oval pink area fading with central palpable nodule. EXTREMITIES: No edema, no skin discoloration or tenderness.  No palpable cords. LYMPH NODES: No palpable inguinal adenopathy  NEUROLOGICAL: Unremarkable. PSYCH:  Appropriate.    Results for orders placed or performed during the hospital encounter of 12/07/17 (from the past 48 hour(s))  Vancomycin, trough     Status: Abnormal   Collection Time: 12/09/17  2:53 AM  Result Value Ref Range   Vancomycin Tr 11 (L) 15 - 20 ug/mL    Comment:  Performed at [facility], [address] [place], [place] [address]  CBC with Differential     Status: Abnormal   Collection Time: 12/09/17  2:53 AM  Result Value Ref Range   WBC 7.2 3.8 - 10.6 K/uL   RBC 2.10 (L) 4.40 - 5.90 MIL/uL   Hemoglobin 6.8 (L) 13.0 - 18.0 g/dL   HCT 19.4 (L) 40.0 - 52.0 %   MCV 92.1 80.0  - 100.0 fL   MCH 32.2 26.0 - 34.0 pg   MCHC 34.9 32.0 - 36.0 g/dL   RDW 15.8 (H) 11.5 - 14.5 %   Platelets 31 (L) 150 - 440 K/uL   Neutrophils Relative % 83 %   Neutro Abs 6.0 1.4 - 6.5 K/uL   Lymphocytes Relative 7 %   Lymphs Abs 0.5 (L) 1.0 - 3.6 K/uL   Monocytes Relative 10 %   Monocytes Absolute 0.7 0.2 - 1.0 K/uL   Eosinophils Relative 0 %   Eosinophils Absolute 0.0 0 - 0.7 K/uL   Basophils Relative 0 %   Basophils Absolute 0.0 0 - 0.1 K/uL    Comment: Performed at [facility], [address] [place], [place] [address]  Basic metabolic panel     Status: Abnormal   Collection Time: 12/09/17  2:53 AM  Result Value Ref Range   Sodium 131 (L) 135 - 145 mmol/L   Potassium 3.6 3.5 - 5.1 mmol/L   Chloride 97 (L) 98 - 111 mmol/L   CO2 27 22 - 32 mmol/L   Glucose, Bld 112 (H) 70 - 99 mg/dL   BUN 10 6 - 20 mg/dL   Creatinine, Ser 0.90 0.61 - 1.24 mg/dL   Calcium 8.1 (L) 8.9 - 10.3 mg/dL   GFR calc non Af Amer >60 >60 mL/min   GFR calc Af Amer >60 >60 mL/min    Comment: (NOTE) The eGFR has been calculated using the CKD EPI equation. This calculation has not been validated in all clinical situations. eGFR's persistently <60 mL/min signify possible Chronic Kidney Disease.    Anion gap 7 5 - 15    Comment: Performed at [facility], [address] [place], [place] [address]  Magnesium     Status: None   Collection Time: 12/09/17  2:53 AM  Result Value Ref Range   Magnesium 1.8 1.7 - 2.4 mg/dL    Comment: Performed at [facility], [address] [place], [place] [address]  Prepare RBC     Status: None   Collection Time: 12/09/17  4:23 AM  Result Value Ref Range   Order Confirmation      ORDER PROCESSED BY BLOOD BANK Performed at [facility], [address] [place], [place] [address]   Prepare RBC     Status: None   Collection Time: 12/09/17  7:36 AM  Result Value Ref Range   Order Confirmation      DUPLICATE Performed at  [facility], [address] [place], [place] [address]   CULTURE, BLOOD (ROUTINE X 2) w Reflex to ID Panel     Status: None (Preliminary result)   Collection Time: 12/09/17  9:06 AM  Result Value Ref Range   Specimen Description BLOOD RIGHT HAND    Special Requests      BOTTLES DRAWN AEROBIC AND ANAEROBIC Blood Culture results may not be optimal due to an excessive volume of blood received in culture bottles   Culture      NO GROWTH <  24 HOURS Performed at [facility], [address] [place], [place] [address]    Report Status PENDING   CULTURE, BLOOD (ROUTINE X 2) w Reflex to ID Panel     Status: None (Preliminary result)   Collection Time: 12/09/17  9:16 AM  Result Value Ref Range   Specimen Description BLOOD LEFT AC    Special Requests      BOTTLES DRAWN AEROBIC AND ANAEROBIC Blood Culture adequate volume   Culture      NO GROWTH < 24 HOURS Performed at [facility], [address] [place], [place] [address]    Report Status PENDING   Vancomycin, trough     Status: Abnormal   Collection Time: 12/10/17  4:13 AM  Result Value Ref Range   Vancomycin Tr 13 (L) 15 - 20 ug/mL    Comment: Performed at [facility], [address] [place], [place] [address]  CBC with Differential     Status: Abnormal   Collection Time: 12/10/17  4:13 AM  Result Value Ref Range   WBC 10.1 3.8 - 10.6 K/uL   RBC 2.18 (L) 4.40 - 5.90 MIL/uL   Hemoglobin 7.2 (L) 13.0 - 18.0 g/dL   HCT 20.5 (L) 40.0 - 52.0 %   MCV 94.0 80.0 - 100.0 fL   MCH 32.8 26.0 - 34.0 pg   MCHC 34.9 32.0 - 36.0 g/dL   RDW 16.4 (H) 11.5 - 14.5 %   Platelets 42 (L) 150 - 440 K/uL   Neutrophils Relative % 94 %   Neutro Abs 9.5 (H) 1.4 - 6.5 K/uL   Lymphocytes Relative 2 %   Lymphs Abs 0.2 (L) 1.0 - 3.6 K/uL   Monocytes Relative 4 %   Monocytes Absolute 0.4 0.2 - 1.0 K/uL   Eosinophils Relative 0 %   Eosinophils Absolute 0.0 0 - 0.7 K/uL   Basophils Relative 0 %   Basophils  Absolute 0.0 0 - 0.1 K/uL    Comment: Performed at [facility], [address] [place], [place] [address]  Basic metabolic panel     Status: Abnormal   Collection Time: 12/10/17  4:13 AM  Result Value Ref Range   Sodium 139 135 - 145 mmol/L   Potassium 3.8 3.5 - 5.1 mmol/L   Chloride 104 98 - 111 mmol/L   CO2 28 22 - 32 mmol/L   Glucose, Bld 144 (H) 70 - 99 mg/dL   BUN 11 6 - 20 mg/dL   Creatinine, Ser 0.95 0.61 - 1.24 mg/dL   Calcium 8.3 (L) 8.9 - 10.3 mg/dL   GFR calc non Af Amer >60 >60 mL/min   GFR calc Af Amer >60 >60 mL/min    Comment: (NOTE) The eGFR has been calculated using the CKD EPI equation. This calculation has not been validated in all clinical situations. eGFR's persistently <60 mL/min signify possible Chronic Kidney Disease.    Anion gap 7 5 - 15    Comment: Performed at [facility], [address] [place], [place] [address]  Magnesium     Status: None   Collection Time: 12/10/17  4:13 AM  Result Value Ref Range   Magnesium 2.0 1.7 - 2.4 mg/dL    Comment: Performed at [facility], [address] [place], [place] [address]  Type and screen [place]     Status: None (Preliminary result)   Collection Time: 12/10/17  4:13 AM  Result Value Ref Range   ABO/RH(D)  A POS    Antibody Screen NEG    Sample Expiration 12/13/2017    Unit Number [id number]    Blood Component Type RBC, LR IRR    Unit division 00    Status of Unit ISSUED    Transfusion Status OK TO TRANSFUSE    Crossmatch Result      Compatible Performed at [facility], [place], [place] [address]   Sedimentation rate     Status: Abnormal   Collection Time: 12/10/17  4:13 AM  Result Value Ref Range   Sed Rate 127 (H) 0 - 15 mm/hr    Comment: Performed at [facility], [address] [place], [place] [address]  C-reactive protein     Status: Abnormal   Collection Time: 12/10/17  4:13 AM  Result Value  Ref Range   CRP 26.9 (H) <1.0 mg/dL    Comment: Performed at [facility], [place] [address], [place], [place] [address]  Prepare RBC     Status: None   Collection Time: 12/10/17  8:07 AM  Result Value Ref Range   Order Confirmation      ORDER PROCESSED BY BLOOD BANK Performed at [facility], [address] [place], [place] [address]    No results found.  Assessment:  OKLEY MAGNUSSEN is a 27 y.o. male with recurrent testicular carcinoma currently day 19 s/p cycle #3 TIP chemotherapy with Udencya support.  He was admitted with fever and tachycardia.  He has developed skin lesions c/w prior cycles.    Skin biopsy on 11/12/2017 revealed diffuse neutrophilic dermatitis.  Skin biopsy on 12/09/2017 again confirmed neutrophilic dermatitis.  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  Abdomen and pelvic CT on 12/07/2017 revealed no new or progressive metastatic disease.  There was no acute vascular abnormality.  IVC filter and left iliac venous stent graft were in place. There was persistent postsurgical fat stranding throughout the left inguinal region without fluid collection.  Bilateral lower extremity duplex on 12/08/2017 revealed new nonocclusive thrombus in the left common femoral vein along the anterior wall measuring 3-5 mm in thickness. This was not flow limiting but is new since the prior study on 11/05/2017 and consistent with acute/subacute thrombus (old per Dr. Delana Meyer).  There was no evidence of right lower extremity deep vein thrombosis.  He began Solumedrol 80 mg IV q day beginning 12/09/2017.  Symptomatically, he  Is feeling better today.  Fever has resolved on antibiotics.  Petechiae are fading.  Hemoglobin 7.2.  Platelet count is 42,000.  WBC is 10,100.  Sed rate is 127.  CRP is 26.9.  Plan:   1.  Oncology:             Counts have not recovered from last cycle.             Anticipate cycle #4 TIP chemotherapy (last) postponed until  12/20/2017. 2.  Hematology:             Chemotherapy induced anemia.             Patient transfused with 1 unit of PRBCs today              Platelet count slowly improving post chemotherapy likely secondary to increased consumption caused by fever.             PT and fibrinogen normal.  PTT slightly elevated with prior testing for lupus anticoagulant negative.  Daily CBC with diff. 3.  Vascular:             Abdomen and pelvic CT revealed intact stent and IVC filter without clot.             Bilateral lower extremity duplex revealed old clot in the left common femoral vein per Dr. Delana Meyer.             Continue to hold Xarelto until platelet count >= 50,000 then consider initiation of heparin or reinstitution of Xarelto. 4.  Infectious disease:             Patient pan cultured and initially started on vancomycin, Cefepime, and Flagyl.  Flagyl discontinued on 12/09/2017.             Cultures negative to date.             Abdominal CT without evidence of infection.   Right foot unremarkable.             Fever resolved after institution of steroids for Sweet's syndrome.  Per ID, plan to discontinue antibiotics in 36 hours. 5.  Skin lesions:             Progressive skin lesions biopsied by dermatology on 12/09/2017 and confirmed neutrophilic dermatosis c/w Sweet's syndrome.  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   Patient meets both major criteria and 3 of 4 minor criteria (4th criteria is excellent response to steroids).  Suspect he will fulfill all criteria.  Day 2 solumedrol (80 mg IV q day) today.    When patient discharged, patient to be discharged on prednisone 50 mg po BID.  Outpatient steroid taper determined on the severity of the cutaneous and systemic symptoms.  Will try to taper by 20 mg every 3 days.  Appreciate dermatology consult. 6.  Gastroenterology:             Nausea and vomiting today felt secondary to Flagyl.             Ondansetron scheduled q 8  hours.  Should improve with discontinuation of Flagyl.  Etiology of LLQ pain unclear, but clinically improving (negative CT scan). 7.  Electrolyte wasting:             Patient has potassium and magnesium wasting secondary to cisplatin.             Follow electrolytes daily.  Potassium and magnesium normal today.             Supplement as needed. 8.  Disposition:    Anticipate discharge per ID and   Patient will need wonld care for left calf.  Patient will be seen in the medical oncology clinic on 12/14/2017.   [name], MD  12/10/2017, 4:20 PM

## 2017-12-10 NOTE — Progress Notes (Addendum)
[facility] [place] NAME: Edgar [name]    MR#:  [id number]  DATE OF BIRTH:  1990/05/07  SUBJECTIVE:  CHIEF COMPLAINT:  No chief complaint on file.  -Hemoglobin remains low at 7.  Platelets are improving. -No fevers this morning  REVIEW OF SYSTEMS:  Review of Systems  Constitutional: Positive for fever and malaise/fatigue. Negative for chills.  HENT: Negative for congestion, ear discharge, hearing loss and nosebleeds.   Respiratory: Negative for cough, shortness of breath and wheezing.   Cardiovascular: Negative for chest pain and palpitations.  Gastrointestinal: Positive for nausea and vomiting. Negative for abdominal pain, constipation and diarrhea.  Genitourinary: Negative for dysuria.  Musculoskeletal: Positive for myalgias.  Skin: Positive for rash.  Neurological: Negative for dizziness, focal weakness, seizures, weakness and headaches.  Psychiatric/Behavioral: Negative for depression. The patient is nervous/anxious.     DRUG ALLERGIES:  No Known Allergies  VITALS:  Blood pressure 100/66, pulse 79, temperature (!) 97.5 F (36.4 C), temperature source Oral, resp. rate 16, height 6\' 2"  (1.88 m), weight 122.4 kg, SpO2 95 %.  PHYSICAL EXAMINATION:  Physical Exam  GENERAL:  27 y.o.-year-old patient lying in the bed with no acute distress.  EYES: Pupils equal, round, reactive to light and accommodation. No scleral icterus. Extraocular muscles intact.  HEENT: Head atraumatic, normocephalic. Oropharynx and nasopharynx clear.  NECK:  Supple, no jugular venous distention. No thyroid enlargement, no tenderness.  LUNGS: Normal breath sounds bilaterally, no wheezing, rales,rhonchi or crepitation. No use of accessory muscles of respiration.  CARDIOVASCULAR: S1, S2 normal. No murmurs, rubs, or gallops.  ABDOMEN: Soft, nontender, nondistended. Bowel sounds present. No organomegaly or mass.  EXTREMITIES: bilateral lower extremity edema  left> right, petechiae noted. no cyanosis, or clubbing.  NEUROLOGIC: Cranial nerves II through XII are intact. Muscle strength 5/5 in all extremities. Sensation intact. Gait not checked.  PSYCHIATRIC: The patient is alert and oriented x 3.  SKIN: Right earlobe lesion noted.  There is a open wound on the left calf and also a small wound in the right leg that has been biopsied   LABORATORY PANEL:   CBC Recent Labs  Lab 12/10/17 0413  WBC 10.1  HGB 7.2*  HCT 20.5*  PLT 42*   ------------------------------------------------------------------------------------------------------------------  Chemistries  Recent Labs  Lab 12/07/17 1144  12/10/17 0413  NA 130*   < > 139  K 4.3   < > 3.8  CL 99   < > 104  CO2 23   < > 28  GLUCOSE 106*   < > 144*  BUN 19   < > 11  CREATININE 1.00   < > 0.95  CALCIUM 8.8*   < > 8.3*  MG 1.4*   < > 2.0  AST 12*  --   --   ALT 12  --   --   ALKPHOS 48  --   --   BILITOT 0.4  --   --    < > = values in this interval not displayed.   ------------------------------------------------------------------------------------------------------------------  Cardiac Enzymes No results for input(s): TROPONINI in the last 168 hours. ------------------------------------------------------------------------------------------------------------------  RADIOLOGY:  No results found.  EKG:   Orders placed or performed in visit on 12/07/17  . EKG 12-Lead  . EKG 12-Lead  . EKG 12-Lead    ASSESSMENT AND PLAN:   27 y/o M with PMH significant for testicular cancer on chemo, h/o DVT s/p IVC, sathma, gerd admitted with fevers.  1. Fevers-possibly  drug-induced fevers.  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  Flagyl discontinued. -Continue antibiotics with vancomycin and cefepime.  Likely will not be discharged on antibiotics at this time. - appreciate dermatology consult- biopsied the lesion on right leg-essential  could be neutrophilic dermatitis, pyoderma gangrenosum. -Continue IV steroids for now per oncology recommendations. - CT abd- no acute findings -If afebrile, can be discharged home tomorrow.  Will not need antibiotics.  Will await oncology input about the dose of steroids and duration of steroids at discharge.  2. Left leg DVT- unable to anticoagulate due to anemia and thrombocytopenia - s/p IVC - vascular consult appreciated- likely chronic clot - xarelto as outpt whenever counts improve  3. Anemia and thrombocytopenia- from chemo -transfuse Prbc today. -Platelets are improving.  No acute indication for transfusion.   4. Testicular cancer- mgmt per oncology - s/p 3 cycles of chemo -Complains of significant anxiety due to everything going on.  Started on Klonopin  5. DVT prophylaxis- TEDs and SCDs only due to anemia and thrombocytopenia  Encourage ambulation Likely discharge within the next 1 to 2 days based on oncology recommendations   All the records are reviewed and case discussed with Care Management/Social Workerr. Management plans discussed with the patient, family and they are in agreement.  CODE STATUS: Full Code  TOTAL TIME TAKING CARE OF THIS PATIENT: 72minutes.   POSSIBLE D/C IN 3 DAYS, DEPENDING ON CLINICAL CONDITION.   Gladstone Lighter M.D on 12/10/2017 at 1:41 PM  Between 7am to 6pm - Pager - [phone]  After 6pm go to [email or web address] - password [email or web address] [facility]  Office  [phone]  CC: Primary care physician; Patient, No Pcp Per

## 2017-12-10 NOTE — Consult Note (Signed)
[facility] Nurse wound consult note Patient examined in [facility] 123.  No family present. Reason for Consult: Left posterior leg wound Wound type: Unknown etiology Injury POA: Yes Measurement: 5 cm x 5.5 cm Wound bed: Blackened, resembles a large hematoma that is open; however, I do not know what type of wound this is. Drainage (amount, consistency, odor) Serosanginous, no odor Periwound:Erythematous, slightly indurated. Dressing procedure/placement/frequency:  Gently cleanse wound on posterior left thigh with saline.  Pat dry.  Place a Xeroform gauze Kellie Simmering 239) over wound.  Wrap with kerlex.  Change daily. Monitor the wound area(s) for worsening of condition such as: Signs/symptoms of infection,  Increase in size,  Development of or worsening of odor, Development of pain, or increased pain at the affected locations.  Notify the medical team if any of these develop.  Thank you for the consult.  Discussed plan of care with the patient and bedside nurse.  [facility] nurse will not follow at this time.  Please re-consult the [facility] team if needed.  Val Riles, RN, MSN, CWOCN, CNS-BC, pager [phone]

## 2017-12-10 NOTE — Progress Notes (Addendum)
[facility] Hematology/Oncology Progress Note  Date of admission: 12/07/2017  Hospital day:  12/09/2017  Chief Complaint: Edgar [name] is a 27 y.o. male with recurrent testicular carcinoma currently day 18 s/p cycle #3 TIP who was admitted from the oncology clinic with fever (102.2) and tachycardia.  Subjective: He feels fatigued.  Nausea controlled with ondansetron.  LLQ still sore.  Skin lesions progressive.  Social History: The patient is accompanied by his girlfriend and daughter today.  Allergies: No Known Allergies  Scheduled Medications: . sodium chloride   Intravenous Once  . sodium chloride   Intravenous Once  . clonazePAM  0.5 mg Oral BID  . DULoxetine  60 mg Oral Daily  . gabapentin  300 mg Oral QHS  . loratadine  10 mg Oral Daily  . magnesium oxide  400 mg Oral Daily  . methylPREDNISolone (SOLU-MEDROL) injection  80 mg Intravenous Q24H  . nicotine  21 mg Transdermal Daily  . potassium chloride SA  20 mEq Oral BID    Review of Systems: GENERAL:  Fatigue.  Fevers with sweats. PERFORMANCE STATUS (ECOG):  1-2 HEENT:  No visual changes, runny nose, sore throat, mouth sores or tenderness. Lungs: No shortness of breath or cough.  No hemoptysis. Cardiac:  No chest pain, palpitations, orthopnea, or PND. GI:  Nausea controlled.  No vomiting, diarrhea, constipation, melena or hematochezia. GU:  No urgency, frequency, dysuria, or hematuria. Musculoskeletal:  Right foot pain comes and goes.  No back pain.  No joint pain.  No muscle tenderness. Extremities:  Less lower extremity edema. Skin:  Dramatic worsening in skin lesions. Neuro:  No headache, numbness or weakness, balance or coordination issues. Endocrine:  No diabetes, thyroid issues, hot flashes or night sweats. Psych:  No mood changes, depression or anxiety. Pain:  Pain associated with left calf lesion. Review of systems:  All other systems reviewed and found to be negative.  Physical  Exam: Blood pressure 98/70, pulse (!) 107, temperature 98.2 F (36.8 C), temperature source Oral, resp. rate 14, height 6' 2"  (1.88 m), weight 269 lb 12.8 oz (122.4 kg), SpO2 96 %.  GENERAL:  Chronically fatigued appearing gentleman sitting comfortably on the medical unit in no acute distress. MENTAL STATUS:  Alert and oriented to person, place and time. HEAD:  Alopecia.  Normocephalic, atraumatic, face symmetric, no Cushingoid features. EYES:  Brown eyes.  Pupils equal round and reactive to light and accomodation.  No conjunctivitis or scleral icterus. ENT:  Oropharynx clear without lesion.  Tongue normal. Mucous membranes moist.  RESPIRATORY:  Clear to auscultation without rales, wheezes or rhonchi. CARDIOVASCULAR:  Regular rate and rhythm without murmur, rub or gallop. ABDOMEN:  Soft, tender in left lower quadrant.  No guarding or rebound tenderness. Active bowel sounds, and no hepatosplenomegaly.  No masses. SKIN:  Right lower extremity biopsy site wrapped.  Large old proximal circular lesions.  New left thigh oval pink area (stable).  Left calf lesion progressive, hemorrhagic with dusky purple centter. EXTREMITIES: Improving lower extremity edema.  No palpable cords. NEUROLOGICAL: Unremarkable. PSYCH:  Appropriate.   Results for orders placed or performed during the hospital encounter of 12/07/17 (from the past 48 hour(s))  Basic metabolic panel     Status: Abnormal   Collection Time: 12/08/17  4:12 AM  Result Value Ref Range   Sodium 133 (L) 135 - 145 mmol/L   Potassium 3.7 3.5 - 5.1 mmol/L   Chloride 102 98 - 111 mmol/L   CO2 25 22 - 32  mmol/L   Glucose, Bld 111 (H) 70 - 99 mg/dL   BUN 12 6 - 20 mg/dL   Creatinine, Ser 0.98 0.61 - 1.24 mg/dL   Calcium 8.4 (L) 8.9 - 10.3 mg/dL   GFR calc non Af Amer >60 >60 mL/min   GFR calc Af Amer >60 >60 mL/min    Comment: (NOTE) The eGFR has been calculated using the CKD EPI equation. This calculation has not been validated in all clinical  situations. eGFR's persistently <60 mL/min signify possible Chronic Kidney Disease.    Anion gap 6 5 - 15    Comment: Performed at [facility], [address] [place], [place] [address]  CBC     Status: Abnormal   Collection Time: 12/08/17  4:12 AM  Result Value Ref Range   WBC 6.3 3.8 - 10.6 K/uL   RBC 2.26 (L) 4.40 - 5.90 MIL/uL   Hemoglobin 7.3 (L) 13.0 - 18.0 g/dL   HCT 21.3 (L) 40.0 - 52.0 %   MCV 94.4 80.0 - 100.0 fL   MCH 32.4 26.0 - 34.0 pg   MCHC 34.4 32.0 - 36.0 g/dL   RDW 16.6 (H) 11.5 - 14.5 %   Platelets 26 (LL) 150 - 440 K/uL    Comment: RESULT REPEATED AND VERIFIED CRITICAL VALUE NOTED.  VALUE IS CONSISTENT WITH PREVIOUSLY REPORTED AND CALLED VALUE. Performed at [facility], [address] [place], [place] [address]   Magnesium     Status: Abnormal   Collection Time: 12/08/17  7:32 AM  Result Value Ref Range   Magnesium 1.6 (L) 1.7 - 2.4 mg/dL    Comment: Performed at [facility], [address] [place], [place] [address]  Protime-INR     Status: None   Collection Time: 12/08/17  7:32 AM  Result Value Ref Range   Prothrombin Time 14.2 11.4 - 15.2 seconds   INR 1.11     Comment: Performed at [facility], [address] [place], [place] [address]  APTT     Status: Abnormal   Collection Time: 12/08/17  7:32 AM  Result Value Ref Range   aPTT 41 (H) 24 - 36 seconds    Comment:        IF BASELINE aPTT IS ELEVATED, SUGGEST PATIENT RISK ASSESSMENT BE USED TO DETERMINE APPROPRIATE ANTICOAGULANT THERAPY. Performed at [facility], [address] [place], [place] [address]   Fibrinogen     Status: Abnormal   Collection Time: 12/08/17  7:32 AM  Result Value Ref Range   Fibrinogen 592 (H) 210 - 475 mg/dL    Comment: Performed at [facility], [address] [place], [place] [address]  Reticulocytes     Status: Abnormal   Collection Time: 12/08/17  7:32 AM  Result Value Ref Range   Retic Ct Pct  1.9 0.4 - 3.1 %   RBC. 2.11 (L) 4.40 - 5.90 MIL/uL   Retic Count, Absolute 40.1 19.0 - 183.0 K/uL    Comment: Performed at [facility], [address] [place], [place] [address]  Procalcitonin - Baseline     Status: None   Collection Time: 12/08/17  7:32 AM  Result Value Ref Range   Procalcitonin 0.13 ng/mL    Comment:        Interpretation: PCT (Procalcitonin) <= 0.5 ng/mL: Systemic infection (sepsis) is not likely. Local bacterial infection is possible. (NOTE)       Sepsis PCT Algorithm  Lower Respiratory Tract                                      Infection PCT Algorithm    ----------------------------     ----------------------------         PCT < 0.25 ng/mL                PCT < 0.10 ng/mL         Strongly encourage             Strongly discourage   discontinuation of antibiotics    initiation of antibiotics    ----------------------------     -----------------------------       PCT 0.25 - 0.50 ng/mL            PCT 0.10 - 0.25 ng/mL               OR       >80% decrease in PCT            Discourage initiation of                                            antibiotics      Encourage discontinuation           of antibiotics    ----------------------------     -----------------------------         PCT >= 0.50 ng/mL              PCT 0.26 - 0.50 ng/mL               AND        <80% decrease in PCT             Encourage initiation of                                             antibiotics       Encourage continuation           of antibiotics    ----------------------------     -----------------------------        PCT >= 0.50 ng/mL                  PCT > 0.50 ng/mL               AND         increase in PCT                  Strongly encourage                                      initiation of antibiotics    Strongly encourage escalation           of antibiotics                                     -----------------------------  PCT <= 0.25 ng/mL                                                 OR                                        > 80% decrease in PCT                                     Discontinue / Do not initiate                                             antibiotics Performed at [facility], [address] [place], [place] [address]   Aerobic Culture (superficial specimen)     Status: None (Preliminary result)   Collection Time: 12/08/17 10:25 AM  Result Value Ref Range   Specimen Description      EAR RIGHT Performed at [facility], [address], [place], [place] [address]    Special Requests      Immunocompromised Performed at [facility], [address] [place], [place] [address]    Gram Stain NO WBC SEEN NO ORGANISMS SEEN     Culture      NO GROWTH < 12 HOURS Performed at [facility], [address] [place], [place] [address]    Report Status PENDING   Vancomycin, trough     Status: Abnormal   Collection Time: 12/09/17  2:53 AM  Result Value Ref Range   Vancomycin Tr 11 (L) 15 - 20 ug/mL    Comment: Performed at [facility], [address] [place], [place] [address]  CBC with Differential     Status: Abnormal   Collection Time: 12/09/17  2:53 AM  Result Value Ref Range   WBC 7.2 3.8 - 10.6 K/uL   RBC 2.10 (L) 4.40 - 5.90 MIL/uL   Hemoglobin 6.8 (L) 13.0 - 18.0 g/dL   HCT 19.4 (L) 40.0 - 52.0 %   MCV 92.1 80.0 - 100.0 fL   MCH 32.2 26.0 - 34.0 pg   MCHC 34.9 32.0 - 36.0 g/dL   RDW 15.8 (H) 11.5 - 14.5 %   Platelets 31 (L) 150 - 440 K/uL   Neutrophils Relative % 83 %   Neutro Abs 6.0 1.4 - 6.5 K/uL   Lymphocytes Relative 7 %   Lymphs Abs 0.5 (L) 1.0 - 3.6 K/uL   Monocytes Relative 10 %   Monocytes Absolute 0.7 0.2 - 1.0 K/uL   Eosinophils Relative 0 %   Eosinophils Absolute 0.0 0 - 0.7 K/uL   Basophils Relative 0 %   Basophils Absolute 0.0 0 - 0.1 K/uL    Comment: Performed at [facility], [address] [place], [place] [address]  Basic metabolic panel     Status: Abnormal   Collection Time: 12/09/17  2:53 AM  Result Value Ref Range   Sodium 131 (L) 135 - 145 mmol/L   Potassium 3.6 3.5 - 5.1 mmol/L   Chloride 97 (L)  98 - 111 mmol/L   CO2 27 22 - 32 mmol/L   Glucose, Bld 112 (H) 70 - 99 mg/dL   BUN 10 6 - 20 mg/dL   Creatinine, Ser 0.90 0.61 - 1.24 mg/dL   Calcium 8.1 (L) 8.9 - 10.3 mg/dL   GFR calc non Af Amer >60 >60 mL/min   GFR calc Af Amer >60 >60 mL/min    Comment: (NOTE) The eGFR has been calculated using the CKD EPI equation. This calculation has not been validated in all clinical situations. eGFR's persistently <60 mL/min signify possible Chronic Kidney Disease.    Anion gap 7 5 - 15    Comment: Performed at [facility], [address] [place], [place] [address]  Magnesium     Status: None   Collection Time: 12/09/17  2:53 AM  Result Value Ref Range   Magnesium 1.8 1.7 - 2.4 mg/dL    Comment: Performed at [facility], [address] [place], [place] [address]  Prepare RBC     Status: None   Collection Time: 12/09/17  4:23 AM  Result Value Ref Range   Order Confirmation      ORDER PROCESSED BY BLOOD BANK Performed at [facility], [address], [place], [place] [address]   Prepare RBC     Status: None   Collection Time: 12/09/17  7:36 AM  Result Value Ref Range   Order Confirmation      DUPLICATE Performed at [facility], [address] [place], [place] [address]    US Venous Img Lower Bilateral  Addendum Date: 12/08/2017   ADDENDUM REPORT: 12/08/2017 12:04 ADDENDUM: There is an error in the report: Nonocclusive thrombus is in the left common femoral vein. Electronically Signed   By: Aletta Edouard M.D.   On: 12/08/2017 12:04   Result Date: 12/08/2017 CLINICAL DATA:  Bilateral lower extremity edema. EXAM: BILATERAL LOWER EXTREMITY VENOUS DOPPLER ULTRASOUND TECHNIQUE: Gray-scale sonography with graded  compression, as well as color Doppler and duplex ultrasound were performed to evaluate the lower extremity deep venous systems from the level of the common femoral vein and including the common femoral, femoral, profunda femoral, popliteal and calf veins including the posterior tibial, peroneal and gastrocnemius veins when visible. The superficial great saphenous vein was also interrogated. Spectral Doppler was utilized to evaluate flow at rest and with distal augmentation maneuvers in the common femoral, femoral and popliteal veins. COMPARISON:  11/05/2017 FINDINGS: RIGHT LOWER EXTREMITY Common Femoral Vein: No evidence of thrombus. Normal compressibility, respiratory phasicity and response to augmentation. Saphenofemoral Junction: No evidence of thrombus. Normal compressibility and flow on color Doppler imaging. Profunda Femoral Vein: No evidence of thrombus. Normal compressibility and flow on color Doppler imaging. Femoral Vein: No evidence of thrombus. Normal compressibility, respiratory phasicity and response to augmentation. Popliteal Vein: No evidence of thrombus. Normal compressibility, respiratory phasicity and response to augmentation. Calf Veins: No evidence of thrombus. Normal compressibility and flow on color Doppler imaging. Superficial Great Saphenous Vein: No evidence of thrombus. Normal compressibility. Venous Reflux:  None. Other Findings: No evidence of superficial thrombophlebitis or abnormal fluid collection. LEFT LOWER EXTREMITY Common Femoral Vein: There is evidence of new nonocclusive thrombus in the left common femoral artery along the anterior wall measuring approximately 3-5 mm in thickness. This does not create flow limitation with good flow seen in the lumen below the level of thrombus. However, compared to the prior study just 1 month ago, this finding is new and consistent with acute/subacute  thrombus. Saphenofemoral Junction: No evidence of thrombus. Normal compressibility and flow on  color Doppler imaging. Profunda Femoral Vein: No evidence of thrombus. Normal compressibility and flow on color Doppler imaging. Femoral Vein: No evidence of thrombus. Normal compressibility, respiratory phasicity and response to augmentation. Popliteal Vein: No evidence of thrombus. Normal compressibility, respiratory phasicity and response to augmentation. Calf Veins: No evidence of thrombus. Normal compressibility and flow on color Doppler imaging. Superficial Great Saphenous Vein: No evidence of thrombus. Normal compressibility. Venous Reflux:  None. Other Findings: No evidence of superficial thrombophlebitis or abnormal fluid collection. IMPRESSION: 1. New nonocclusive thrombus in the left common femoral artery along the anterior wall measuring 3-5 mm in thickness. This is not flow limiting but is new since the prior study on 11/05/2017 and consistent with acute/subacute thrombus. 2. No evidence of right lower extremity deep vein thrombosis. Electronically Signed: By: Aletta Edouard M.D. On: 12/08/2017 11:12    Assessment:  Edgar [name] is a 27 y.o. male with recurrent testicular carcinoma currently day 18 s/p cycle #3 TIP chemotherapy with Udencya support.  Abdomen and pelvic CT on 12/07/2017 revealed no new or progressive metastatic disease.  There was no acute vascular abnormality.  IVC filter and left iliac venous stent graft were in place. There was persistent postsurgical fat stranding throughout the left inguinal region without fluid collection.  Bilateral lower extremity duplex on 12/08/2017 revealed new nonocclusive thrombus in the left common femoral vein along the anterior wall measuring 3-5 mm in thickness. This was not flow limiting but is new since the prior study on 11/05/2017 and consistent with acute/subacute thrombus.  There was no evidence of right lower extremity deep vein thrombosis.  Symptomatically, he continues to have fevers.  Lower extremity lesions have progressed.   Petechiae continue to fade.  Hemoglobin 6.8.  Platelet count is 31,000.  WBC is 7200.  Plan:   1.  Oncology:             Counts have not recovered from last cycle.             Anticipate cycle #4 TIP chemotherapy (last) postponed until 12/20/2017.             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 2.  Hematology:             Chemotherapy induced anemia.             Maintain active type and screen.             Discuss plan for 1-2 unit of PRBCs once fever improves.             Platelet count remains low post chemotherapy likely secondary to increased consumption caused by fever.             PT and fibrinogen normal.  PTT slightly elevated with prior testing for lupus anticoagulant negative.             Daily CBC with diff. 3.  Vascular:             Abdomen and pelvic CT revealed intact stent and IVC filter without clot.             Bilateral lower extremity duplex reveals old clot in the left common femoral vein per Dr. Delana Meyer.             Continue to hold Xarelto until platelet count >= 50,000 then consider heparin. 4.  Infectious disease:  Patient pan cultured and initially started on vancomycin, Cefepime, and Flagyl.  Flagyl discontinued.             Blood culture q 24 hours prn temp >= 100.4.  Cultures negative to date.             Abdominal CT without evidence of infection.   Right foot unremarkable.             Fever likely caused by Sweet's syndrome.  Appreciate ID consult. 5.  Skin lesions:             Progressive skin lesions biopsied by dermatology today.  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   Check sed rate and CRP in AM.  Begin solumedrol 80 mg IV q day.  Discussed with patient and nursing.             Awaiting callback from pharmaceutical company regarding specific recommendations (now and with future cycles).  Appreciate dermatology consult. 6.  Gastroenterology:             Nausea and vomiting today felt secondary to  Flagyl.             Ondansetron scheduled q 8 hours.  Should improve with discontinuation of Flagyl.  Etiology of LLQ pain unclear. 7.  Electrolyte wasting:             Patient has potassium and magnesium wasting secondary to cisplatin.             Follow electrolytes daily.  Potassium and magnesium normal today.             Supplement as needed. 8.  Disposition:    Anticipate improvement in next 2 days with initiation of steroids.   [name], MD  12/09/2017, 7:35 PM

## 2017-12-10 NOTE — Consult Note (Signed)
Pharmacy Antibiotic Note  Edgar [name] is a 27 y.o. male admitted on 12/07/2017 with Febrile Neutropenia.  Pharmacy has been consulted for Vancomycin + Cefepime dosing.  Plan: Patient has testicular cancer and is currently receiving cycle #3 of TIP chemo, day 16. Was admitted last month and treated for cellulitis.  09/27 @ 0400 VT 13 drawn appropriately. Will increase dose to vanc 1g IV q6h w/ trough @ 0430 prior 4th dose. Will continue to monitor renal function, remains stable.  Height: 6\' 2"  (188 cm) Weight: 269 lb 12.8 oz (122.4 kg) IBW/kg (Calculated) : 82.2  Temp (24hrs), Avg:99.9 F (37.7 C), Min:97.6 F (36.4 C), Max:102.6 F (39.2 C)  Recent Labs  Lab 12/06/17 1145 12/07/17 1144 12/08/17 0412 12/09/17 0253 12/10/17 0413  WBC 6.8 7.1 6.3 7.2 10.1  CREATININE  --  1.00 0.98 0.90 0.95  VANCOTROUGH  --   --   --  11* 13*    Estimated Creatinine Clearance: 159.5 mL/min (by C-G formula based on SCr of 0.95 mg/dL).    No Known Allergies  Antimicrobials this admission: 0924 Cefepime >>  0924  Vancomycin >>  0924 Flagyl  >>   Microbiology results: 0924 BCx: ordered 0924 UCx: ordered   Pharmacy will continue to monitor.  Thank you for allowing pharmacy to be a part of this patient's care.  Tobie Lords, PharmD, BCPS Clinical Pharmacist 12/10/2017

## 2017-12-11 ENCOUNTER — Telehealth: Payer: Self-pay | Admitting: Oncology

## 2017-12-11 ENCOUNTER — Other Ambulatory Visit: Payer: Self-pay | Admitting: Oncology

## 2017-12-11 ENCOUNTER — Encounter: Payer: Self-pay | Admitting: Urgent Care

## 2017-12-11 LAB — CBC WITH DIFFERENTIAL/PLATELET
Basophils Absolute: 0 10*3/uL (ref 0–0.1)
Basophils Relative: 0 %
Eosinophils Absolute: 0 10*3/uL (ref 0–0.7)
Eosinophils Relative: 0 %
HCT: 23.6 % — ABNORMAL LOW (ref 40.0–52.0)
Hemoglobin: 8.2 g/dL — ABNORMAL LOW (ref 13.0–18.0)
Lymphocytes Relative: 3 %
Lymphs Abs: 0.3 10*3/uL — ABNORMAL LOW (ref 1.0–3.6)
MCH: 32.1 pg (ref 26.0–34.0)
MCHC: 34.6 g/dL (ref 32.0–36.0)
MCV: 92.8 fL (ref 80.0–100.0)
Monocytes Absolute: 0.2 10*3/uL (ref 0.2–1.0)
Monocytes Relative: 2 %
Neutro Abs: 10.6 10*3/uL — ABNORMAL HIGH (ref 1.4–6.5)
Neutrophils Relative %: 95 %
Platelets: 72 10*3/uL — ABNORMAL LOW (ref 150–440)
RBC: 2.55 MIL/uL — ABNORMAL LOW (ref 4.40–5.90)
RDW: 17.3 % — ABNORMAL HIGH (ref 11.5–14.5)
WBC: 11.1 10*3/uL — ABNORMAL HIGH (ref 3.8–10.6)

## 2017-12-11 LAB — BASIC METABOLIC PANEL
Anion gap: 6 (ref 5–15)
BUN: 17 mg/dL (ref 6–20)
CO2: 27 mmol/L (ref 22–32)
Calcium: 8.7 mg/dL — ABNORMAL LOW (ref 8.9–10.3)
Chloride: 109 mmol/L (ref 98–111)
Creatinine, Ser: 0.7 mg/dL (ref 0.61–1.24)
GFR calc Af Amer: >60 mL/min (ref >60)
GFR calc non Af Amer: >60 mL/min (ref >60)
Glucose, Bld: 144 mg/dL — ABNORMAL HIGH (ref 70–99)
Potassium: 3.5 mmol/L (ref 3.5–5.1)
Sodium: 142 mmol/L (ref 135–145)

## 2017-12-11 LAB — BPAM RBC
Blood Product Expiration Date: 201910142359
ISSUE DATE / TIME: 201909271317
Unit Type and Rh: 6200

## 2017-12-11 LAB — TYPE AND SCREEN
ABO/RH(D): A POS
Antibody Screen: NEGATIVE
Unit division: 0

## 2017-12-11 LAB — MAGNESIUM: Magnesium: 2 mg/dL (ref 1.7–2.4)

## 2017-12-11 MED ORDER — CLONAZEPAM 0.5 MG PO TBDP: 0.5000 mg | ORAL_TABLET | Freq: Two times a day (BID) | ORAL | 0 refills | Status: DC

## 2017-12-11 MED ORDER — PREDNISONE 10 MG PO TABS: 50.0000 mg | ORAL_TABLET | Freq: Two times a day (BID) | ORAL | 0 refills | Status: DC

## 2017-12-11 MED ORDER — HEPARIN SOD (PORK) LOCK FLUSH 100 UNIT/ML IV SOLN
INTRAVENOUS | Status: AC
  Administered 2017-12-11: 10:00:00
  Filled 2017-12-11: qty 5

## 2017-12-11 MED ORDER — OXYCODONE HCL 10 MG PO TABS: 10.0000 mg | ORAL_TABLET | Freq: Three times a day (TID) | ORAL | 0 refills | Status: DC | PRN

## 2017-12-11 NOTE — Telephone Encounter (Signed)
Patient called and requests pain medication to be refilled.  He was just released from hospital today. He was diagnosed with sweet' syndrome.  He says he takes 10mg  tablets as needed for lower extremity pain.  He only has 2 tablets left. At discharge, he was told to call oncology to get refills.  I discussed with his primary oncologist Dr.Corcoran. Ok to send 2-3 days supply of Oxycodone 10mg  Q8hPRN to cover his weekend. Patient confirms that he is taking his Xarelto as instructed.  He knows that he needs to follow up with Dr.Corcoran next week.

## 2017-12-11 NOTE — Discharge Summary (Addendum)
[facility] [place] NAME: Edgar [name]    MR#:  [id number]  DATE OF BIRTH:  [birth year]  DATE OF ADMISSION:  12/07/2017 ADMITTING PHYSICIAN: [name], MD  DATE OF DISCHARGE: 12/11/2017  PRIMARY CARE PHYSICIAN: [name], MD    ADMISSION DIAGNOSIS:  Testicular cancer poss sepsis  DISCHARGE DIAGNOSIS:  Principal Problem:   Fever Active Problems:   Anemia   Antineoplastic chemotherapy induced anemia   Sweet's syndrome   SECONDARY DIAGNOSIS:   Past Medical History:  Diagnosis Date  . Asthma    AS A CHILD-NO INHALERS  . Cancer ([place])    testicular cancer 11/2016 per pt   . DVT (deep venous thrombosis) ([place])   . GERD (gastroesophageal reflux disease)    TUMS PRN    HOSPITAL COURSE:  27 year old male with recurrent testicular carcinoma currently on chemotherapy who presented from oncology with fever and tachycardia.  1.  Fever with diagnosis of Sweet's syndrome s/p 3 cm punch biopsy.: Patient ruled out for sepsis.  Patient had progressive skin lesions and it was biopsied by dermatology on September 26.  Pathology confirmed sweet syndrome Recommendations by oncology is prednisone 50 mill grams p.o. twice daily.  Outpatient steroid taper determined on the severity of the cutaneous and systemic symptoms.For long term management for additional rounds of chemotherapy necessitating granulocyte stimulating factor, can consider steroid-sparing agents including: colchicine 1.5mg  daily, dapsone 100-200mg  daily, or potassium iodide 900mg /day Abdominal CT was unremarkable. Patient has defervesced and was followed by ID while in the hospital as well.  No need for antibiotics upon discharge.Wound care- Vaseline and a bandage changed daily until healed (1-2 weeks).   2.  History of left lower extremity DVT: Initially Xarelto was on hold due to anemia and low platelet count.  Patient received 1 unit of PRBCs and 1 unit of platelets.   Anemia and thrombus cytopenia are due to underlying cancer and chemotherapy.  Platelets have improved and recommendations are to restart Xarelto once platelet 0/50.  He needs close follow-up with CBC.  3.  Recurrent testicular cancer: Patient will follow up with Dr. Mike Gip. 4. Tobacco dependence: Patient is encouraged to quit smoking. Counseling was provided for 4 minutes.   DISCHARGE CONDITIONS AND DIET:   Stable for discharge on regular diet  CONSULTS OBTAINED:  Treatment Team:  [name], MD Tsosie Billing, MD Ralene Bathe, MD  DRUG ALLERGIES:  No Known Allergies  DISCHARGE MEDICATIONS:   Allergies as of 12/11/2017   No Known Allergies     Medication List    STOP taking these medications   acetaminophen 500 MG tablet Commonly known as:  TYLENOL   amoxicillin 500 MG tablet Commonly known as:  AMOXIL   CYMBALTA 60 MG capsule Generic drug:  DULoxetine   dexamethasone 4 MG tablet Commonly known as:  DECADRON   gabapentin 300 MG capsule Commonly known as:  NEURONTIN   loratadine 10 MG tablet Commonly known as:  CLARITIN   magnesium oxide 400 MG tablet Commonly known as:  MAG-OX   nicotine 21 mg/24hr patch Commonly known as:  NICODERM CQ - dosed in mg/24 hours   potassium chloride SA 20 MEQ tablet Commonly known as:  K-DUR,KLOR-CON     TAKE these medications   clonazePAM 0.5 MG disintegrating tablet Commonly known as:  KLONOPIN Take 1 tablet (0.5 mg total) by mouth 2 (two) times daily.   predniSONE 10 MG tablet Commonly known as:  DELTASONE Take 5  tablets (50 mg total) by mouth 2 (two) times daily with a meal.   XARELTO 20 MG Tabs tablet Generic drug:  rivaroxaban Take 1 tablet by mouth daily.         Today   CHIEF COMPLAINT:  Patient doing well this morning no fevers overnight   VITAL SIGNS:  Blood pressure 114/80, pulse 75, temperature 97.7 F (36.5 C), temperature source Oral, resp. rate 14, height 6\' 2"  (1.88  m), weight 122.4 kg, SpO2 95 %.   REVIEW OF SYSTEMS:  Review of Systems  Constitutional: Negative.  Negative for chills, fever and malaise/fatigue.  HENT: Negative.  Negative for ear discharge, ear pain, hearing loss, nosebleeds and sore throat.   Eyes: Negative.  Negative for blurred vision and pain.  Respiratory: Negative.  Negative for cough, hemoptysis, shortness of breath and wheezing.   Cardiovascular: Negative.  Negative for chest pain, palpitations and leg swelling.  Gastrointestinal: Negative.  Negative for abdominal pain, blood in stool, diarrhea, nausea and vomiting.  Genitourinary: Negative.  Negative for dysuria.  Musculoskeletal: Negative.  Negative for back pain.  Skin: Negative.   Neurological: Negative for dizziness, tremors, speech change, focal weakness, seizures and headaches.  Endo/Heme/Allergies: Negative.  Does not bruise/bleed easily.  Psychiatric/Behavioral: Negative.  Negative for depression, hallucinations and suicidal ideas.     PHYSICAL EXAMINATION:  GENERAL:  27 y.o.-year-old patient lying in the bed with no acute distress.  NECK:  Supple, no jugular venous distention. No thyroid enlargement, no tenderness.  LUNGS: Normal breath sounds bilaterally, no wheezing, rales,rhonchi  No use of accessory muscles of respiration.  CARDIOVASCULAR: S1, S2 normal. No murmurs, rubs, or gallops.  ABDOMEN: Soft, non-tender, non-distended. Bowel sounds present. No organomegaly or mass.  EXTREMITIES: No pedal edema, cyanosis, or clubbing.  PSYCHIATRIC: The patient is alert and oriented x 3.  SKIN: She has lesion on left calf and small wound in the right leg that has been biopsied.  Small lesion on the right earlobe DATA REVIEW:   CBC Recent Labs  Lab 12/11/17 0553  WBC 11.1*  HGB 8.2*  HCT 23.6*  PLT 72*    Chemistries  Recent Labs  Lab 12/07/17 1144  12/11/17 0553  NA 130*   < > 142  K 4.3   < > 3.5  CL 99   < > 109  CO2 23   < > 27  GLUCOSE 106*   < >  144*  BUN 19   < > 17  CREATININE 1.00   < > 0.70  CALCIUM 8.8*   < > 8.7*  MG 1.4*   < > 2.0  AST 12*  --   --   ALT 12  --   --   ALKPHOS 48  --   --   BILITOT 0.4  --   --    < > = values in this interval not displayed.    Cardiac Enzymes No results for input(s): TROPONINI in the last 168 hours.  Microbiology Results  @MICRORSLT48 @  RADIOLOGY:  No results found.    Allergies as of 12/11/2017   No Known Allergies     Medication List    STOP taking these medications   acetaminophen 500 MG tablet Commonly known as:  TYLENOL   amoxicillin 500 MG tablet Commonly known as:  AMOXIL   CYMBALTA 60 MG capsule Generic drug:  DULoxetine   dexamethasone 4 MG tablet Commonly known as:  DECADRON   gabapentin 300 MG capsule Commonly known as:  NEURONTIN   loratadine 10 MG tablet Commonly known as:  CLARITIN   magnesium oxide 400 MG tablet Commonly known as:  MAG-OX   nicotine 21 mg/24hr patch Commonly known as:  NICODERM CQ - dosed in mg/24 hours   potassium chloride SA 20 MEQ tablet Commonly known as:  K-DUR,KLOR-CON     TAKE these medications   clonazePAM 0.5 MG disintegrating tablet Commonly known as:  KLONOPIN Take 1 tablet (0.5 mg total) by mouth 2 (two) times daily.   predniSONE 10 MG tablet Commonly known as:  DELTASONE Take 5 tablets (50 mg total) by mouth 2 (two) times daily with a meal.   XARELTO 20 MG Tabs tablet Generic drug:  rivaroxaban Take 1 tablet by mouth daily.          Management plans discussed with the patient and he is in agreement. Stable for discharge home  Patient should follow up with pcp  CODE STATUS:     Code Status Orders  (From admission, onward)         Start     Ordered   12/07/17 1802  Full code  Continuous     12/07/17 1801        Code Status History    Date Active Date Inactive Code Status Order ID Comments User Context   11/22/2017 1123 11/27/2017 1438 Full Code [id number]  Saundra Shelling, MD Inpatient    11/05/2017 1442 11/09/2017 2046 Full Code [id number]  Gorden Harms, MD Inpatient   10/21/2017 1110 10/26/2017 1635 Full Code [id number]  Vaughan Basta, MD Inpatient   10/08/2017 1621 10/16/2017 1836 Full Code [id number]  Vaughan Basta, MD Inpatient      TOTAL TIME TAKING CARE OF THIS PATIENT: 38 minutes.    Note: This dictation was prepared with Dragon dictation along with smaller phrase technology. Any transcriptional errors that result from this process are unintentional.  Desha Bitner M.D on 12/11/2017 at 9:23 AM  Between 7am to 6pm - Pager - [phone] After 6pm go to [email or web address] - password [email or web address] [facility]  Office  [phone]  CC: Primary care physician; [name], MD

## 2017-12-11 NOTE — Progress Notes (Signed)
MD order received in [facility] to discharge pt home with home health services today; Care Management, Josh RN previously resumed Home health PT, OT, RN and SW with [facility] per note in [facility]; verbally reviewed AVS with pt, gave Rxs for Klonopin and Prednisone to pt; no questions voiced at this time; pt discharged via wheelchair by auxillary to the visitor's entrance

## 2017-12-11 NOTE — Care Management Note (Signed)
Case Management Note  Patient Details  Name: Edgar [name] MRN: [id number] Date of Birth: [birth year]  Subjective/Objective:  Patient to be discharged per MD order. Orders in place for home health services. Patient currently open with [facility] home health agency. Resumption orders in place. Confirmed referral with [place] from [facility] for PT,OT,RN and SW services. No DME needs. Family to provide transport.  Ines Bloomer RN BSN RNCM [phone]                     Action/Plan:   Expected Discharge Date:  12/11/17               Expected Discharge Plan:  [place]  In-House Referral:     Discharge planning Services  CM Consult  Post Acute Care Choice:  Home Health, Resumption of Svcs/PTA Provider Choice offered to:  Patient  DME Arranged:    DME Agency:     HH Arranged:  RN, PT, OT, Social Work [employer] Agency:  [facility]  Status of Service:  Completed, signed off  If discussed at [employer] [facility] [employer], dates discussed:    Additional Comments:  Latanya Maudlin, RN 12/11/2017, 10:19 AM

## 2017-12-12 LAB — CULTURE, BLOOD (ROUTINE X 2)
Culture: NO GROWTH
Culture: NO GROWTH
Special Requests: ADEQUATE

## 2017-12-12 LAB — AEROBIC CULTURE W GRAM STAIN (SUPERFICIAL SPECIMEN): Gram Stain: NONE SEEN

## 2017-12-12 LAB — AEROBIC CULTURE  (SUPERFICIAL SPECIMEN)

## 2017-12-13 ENCOUNTER — Other Ambulatory Visit: Payer: Self-pay | Admitting: Urgent Care

## 2017-12-13 DIAGNOSIS — I824Y2 Acute embolism and thrombosis of unspecified deep veins of left proximal lower extremity: Secondary | ICD-10-CM | POA: Diagnosis not present

## 2017-12-13 DIAGNOSIS — L988 Other specified disorders of the skin and subcutaneous tissue: Secondary | ICD-10-CM | POA: Diagnosis not present

## 2017-12-13 DIAGNOSIS — Z452 Encounter for adjustment and management of vascular access device: Secondary | ICD-10-CM | POA: Diagnosis not present

## 2017-12-13 DIAGNOSIS — I82412 Acute embolism and thrombosis of left femoral vein: Secondary | ICD-10-CM | POA: Diagnosis not present

## 2017-12-13 DIAGNOSIS — F329 Major depressive disorder, single episode, unspecified: Secondary | ICD-10-CM | POA: Diagnosis not present

## 2017-12-13 DIAGNOSIS — F17210 Nicotine dependence, cigarettes, uncomplicated: Secondary | ICD-10-CM | POA: Diagnosis not present

## 2017-12-13 DIAGNOSIS — Z792 Long term (current) use of antibiotics: Secondary | ICD-10-CM | POA: Diagnosis not present

## 2017-12-13 DIAGNOSIS — C6290 Malignant neoplasm of unspecified testis, unspecified whether descended or undescended: Secondary | ICD-10-CM | POA: Diagnosis not present

## 2017-12-13 DIAGNOSIS — Z9181 History of falling: Secondary | ICD-10-CM | POA: Diagnosis not present

## 2017-12-13 DIAGNOSIS — Z79891 Long term (current) use of opiate analgesic: Secondary | ICD-10-CM | POA: Diagnosis not present

## 2017-12-13 DIAGNOSIS — K219 Gastro-esophageal reflux disease without esophagitis: Secondary | ICD-10-CM | POA: Diagnosis not present

## 2017-12-13 DIAGNOSIS — L03116 Cellulitis of left lower limb: Secondary | ICD-10-CM | POA: Diagnosis not present

## 2017-12-13 DIAGNOSIS — L982 Febrile neutrophilic dermatosis [Sweet]: Secondary | ICD-10-CM | POA: Diagnosis not present

## 2017-12-13 DIAGNOSIS — D6481 Anemia due to antineoplastic chemotherapy: Secondary | ICD-10-CM | POA: Diagnosis not present

## 2017-12-13 DIAGNOSIS — D649 Anemia, unspecified: Secondary | ICD-10-CM | POA: Diagnosis not present

## 2017-12-13 DIAGNOSIS — F321 Major depressive disorder, single episode, moderate: Secondary | ICD-10-CM | POA: Diagnosis not present

## 2017-12-13 DIAGNOSIS — J45909 Unspecified asthma, uncomplicated: Secondary | ICD-10-CM | POA: Diagnosis not present

## 2017-12-13 DIAGNOSIS — Z7901 Long term (current) use of anticoagulants: Secondary | ICD-10-CM | POA: Diagnosis not present

## 2017-12-13 LAB — AEROBIC CULTURE  (SUPERFICIAL SPECIMEN): GRAM STAIN: NONE SEEN

## 2017-12-13 LAB — AEROBIC CULTURE W GRAM STAIN (SUPERFICIAL SPECIMEN): Culture: NO GROWTH

## 2017-12-13 MED ORDER — OXYCODONE HCL 10 MG PO TABS: 10.0000 mg | ORAL_TABLET | Freq: Three times a day (TID) | ORAL | 0 refills | Status: DC | PRN

## 2017-12-14 ENCOUNTER — Encounter: Payer: Self-pay | Admitting: Urgent Care

## 2017-12-14 ENCOUNTER — Encounter: Payer: Self-pay | Admitting: Hematology and Oncology

## 2017-12-14 ENCOUNTER — Other Ambulatory Visit: Payer: Self-pay | Admitting: Urgent Care

## 2017-12-14 ENCOUNTER — Telehealth: Payer: Self-pay | Admitting: *Deleted

## 2017-12-14 ENCOUNTER — Inpatient Hospital Stay: Payer: BLUE CROSS/BLUE SHIELD | Attending: Hematology and Oncology | Admitting: Hematology and Oncology

## 2017-12-14 ENCOUNTER — Inpatient Hospital Stay: Payer: BLUE CROSS/BLUE SHIELD

## 2017-12-14 VITALS — BP 124/82 | HR 112 | Temp 97.2°F | Resp 18 | Wt 288.2 lb

## 2017-12-14 DIAGNOSIS — Z803 Family history of malignant neoplasm of breast: Secondary | ICD-10-CM | POA: Diagnosis not present

## 2017-12-14 DIAGNOSIS — T451X5A Adverse effect of antineoplastic and immunosuppressive drugs, initial encounter: Secondary | ICD-10-CM | POA: Insufficient documentation

## 2017-12-14 DIAGNOSIS — D61810 Antineoplastic chemotherapy induced pancytopenia: Secondary | ICD-10-CM

## 2017-12-14 DIAGNOSIS — C6210 Malignant neoplasm of unspecified descended testis: Secondary | ICD-10-CM

## 2017-12-14 DIAGNOSIS — Z801 Family history of malignant neoplasm of trachea, bronchus and lung: Secondary | ICD-10-CM | POA: Insufficient documentation

## 2017-12-14 DIAGNOSIS — R609 Edema, unspecified: Secondary | ICD-10-CM | POA: Insufficient documentation

## 2017-12-14 DIAGNOSIS — G620 Drug-induced polyneuropathy: Secondary | ICD-10-CM | POA: Insufficient documentation

## 2017-12-14 DIAGNOSIS — C6212 Malignant neoplasm of descended left testis: Secondary | ICD-10-CM | POA: Diagnosis not present

## 2017-12-14 DIAGNOSIS — Z800 Family history of malignant neoplasm of digestive organs: Secondary | ICD-10-CM | POA: Insufficient documentation

## 2017-12-14 DIAGNOSIS — Z79899 Other long term (current) drug therapy: Secondary | ICD-10-CM | POA: Insufficient documentation

## 2017-12-14 DIAGNOSIS — L982 Febrile neutrophilic dermatosis [Sweet]: Secondary | ICD-10-CM | POA: Insufficient documentation

## 2017-12-14 DIAGNOSIS — I82412 Acute embolism and thrombosis of left femoral vein: Secondary | ICD-10-CM

## 2017-12-14 DIAGNOSIS — F17210 Nicotine dependence, cigarettes, uncomplicated: Secondary | ICD-10-CM

## 2017-12-14 DIAGNOSIS — K219 Gastro-esophageal reflux disease without esophagitis: Secondary | ICD-10-CM | POA: Insufficient documentation

## 2017-12-14 DIAGNOSIS — R1032 Left lower quadrant pain: Secondary | ICD-10-CM | POA: Diagnosis not present

## 2017-12-14 DIAGNOSIS — Z7901 Long term (current) use of anticoagulants: Secondary | ICD-10-CM | POA: Diagnosis not present

## 2017-12-14 DIAGNOSIS — R112 Nausea with vomiting, unspecified: Secondary | ICD-10-CM | POA: Diagnosis not present

## 2017-12-14 DIAGNOSIS — Z86718 Personal history of other venous thrombosis and embolism: Secondary | ICD-10-CM | POA: Diagnosis not present

## 2017-12-14 DIAGNOSIS — K2960 Other gastritis without bleeding: Secondary | ICD-10-CM

## 2017-12-14 DIAGNOSIS — Z7689 Persons encountering health services in other specified circumstances: Secondary | ICD-10-CM | POA: Insufficient documentation

## 2017-12-14 DIAGNOSIS — Z7952 Long term (current) use of systemic steroids: Secondary | ICD-10-CM | POA: Diagnosis not present

## 2017-12-14 DIAGNOSIS — E876 Hypokalemia: Secondary | ICD-10-CM

## 2017-12-14 DIAGNOSIS — J45909 Unspecified asthma, uncomplicated: Secondary | ICD-10-CM

## 2017-12-14 DIAGNOSIS — D6481 Anemia due to antineoplastic chemotherapy: Secondary | ICD-10-CM

## 2017-12-14 DIAGNOSIS — D696 Thrombocytopenia, unspecified: Secondary | ICD-10-CM

## 2017-12-14 LAB — CBC WITH DIFFERENTIAL/PLATELET
Basophils Absolute: 0.1 10*3/uL (ref 0–0.1)
Basophils Relative: 0 %
Eosinophils Absolute: 0 10*3/uL (ref 0–0.7)
Eosinophils Relative: 0 %
HCT: 25.2 % — ABNORMAL LOW (ref 40.0–52.0)
Hemoglobin: 8.3 g/dL — ABNORMAL LOW (ref 13.0–18.0)
Lymphocytes Relative: 14 %
Lymphs Abs: 2.1 10*3/uL (ref 1.0–3.6)
MCH: 31.5 pg (ref 26.0–34.0)
MCHC: 33.1 g/dL (ref 32.0–36.0)
MCV: 95.1 fL (ref 80.0–100.0)
Monocytes Absolute: 1.4 10*3/uL — ABNORMAL HIGH (ref 0.2–1.0)
Monocytes Relative: 10 %
Neutro Abs: 11.1 10*3/uL — ABNORMAL HIGH (ref 1.4–6.5)
Neutrophils Relative %: 76 %
Platelets: 244 10*3/uL (ref 150–440)
RBC: 2.65 MIL/uL — ABNORMAL LOW (ref 4.40–5.90)
RDW: 16.1 % — ABNORMAL HIGH (ref 11.5–14.5)
Smear Review: ADEQUATE
WBC: 13.2 10*3/uL — ABNORMAL HIGH (ref 4.0–10.5)
nRBC: 11 /100 WBC — ABNORMAL HIGH

## 2017-12-14 LAB — CULTURE, BLOOD (ROUTINE X 2)
Culture: NO GROWTH
Culture: NO GROWTH
Special Requests: ADEQUATE

## 2017-12-14 LAB — MAGNESIUM: Magnesium: 1.8 mg/dL (ref 1.7–2.4)

## 2017-12-14 LAB — SEDIMENTATION RATE: Sed Rate: 46 mm/hr — ABNORMAL HIGH (ref 0–15)

## 2017-12-14 LAB — BASIC METABOLIC PANEL
Anion gap: 10 (ref 5–15)
BUN: 21 mg/dL — ABNORMAL HIGH (ref 6–20)
CO2: 26 mmol/L (ref 22–32)
Calcium: 9.2 mg/dL (ref 8.9–10.3)
Chloride: 103 mmol/L (ref 98–111)
Creatinine, Ser: 0.9 mg/dL (ref 0.61–1.24)
GFR calc Af Amer: >60 mL/min (ref >60)
GFR calc non Af Amer: >60 mL/min (ref >60)
Glucose, Bld: 95 mg/dL (ref 70–99)
Potassium: 3.9 mmol/L (ref 3.5–5.1)
Sodium: 139 mmol/L (ref 135–145)

## 2017-12-14 LAB — C-REACTIVE PROTEIN: CRP: 3.9 mg/dL — ABNORMAL HIGH (ref <1.0)

## 2017-12-14 MED ORDER — OMEPRAZOLE 20 MG PO CPDR: 20.0000 mg | DELAYED_RELEASE_CAPSULE | Freq: Every day | ORAL | 1 refills | Status: DC

## 2017-12-14 MED ORDER — PREDNISONE 10 MG PO TABS: 30.0000 mg | ORAL_TABLET | Freq: Two times a day (BID) | ORAL | 0 refills | Status: DC

## 2017-12-14 NOTE — Progress Notes (Signed)
[facility] day:  12/14/2017  Chief Complaint: Edgar [name] is a 27 y.o. male with recurrent testicular cancer who is seen for assessment on day 23 of cycle #3 TIP chemotherapy.  HPI: The patient was last seen in the medical oncology clinic on 12/07/2017.  At that time, he was fatigued.  He was febrile and tachycardic on amoxicillin. WBC had recovered.  He was thrombocytopenic.  He had bilateral lower extremity edema off Xarelto since thrombocytopenia.  He was admitted to [facility] from 12/07/2017 - 12/11/2017.  He was cultured and started on broad spectrum antibiotics.  He developed recurrent leg lesions. Dermatology was consulted.  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  He fevers quickly resolved.  While hospitalized he received 1 unit of PRBCs.  Platelets became > 50,000 on 12/11/2017 and Xarelto was restarted.  While hospitalized, he had LLQ pain.  Abdomen and pelvic CTon 12/07/2017 revealed no new or progressive metastatic disease.There was no acute vascular abnormality. IVC filter and left iliac venous stent graft werein place. There was persistent postsurgical fat stranding throughout the leftinguinal region without fluid collection.    Bilateral lower extremity duplex on 12/08/2017 revealed new nonocclusive thrombus in the left common femoralveinalong the anterior wall measuring 3-5 mm in thickness. This wasnot flow limiting but is new since the prior study on 11/05/2017 and consistent with acute/subacute thrombus (old per Dr. Delana Meyer). There was no evidence of right lower extremity deep vein thrombosis  CBC on discharge included a hematocrit of 23.6, hemoglobin 8.2, MCV 92.8, platelets 72,000, WBC 11,100 with an ANC of 10,600.  He was discharged on prednisone 50 mg BID.  During the interim, he has felt  "good".  Energy level remains low.  He is eating a lot on steroids.  He has gained 14 pounds.  He notes recent trauma to his left calf after dry gauze dressing was pulled off on 12/11/2017.  Left calf pain is 3 out of 10.  He notes that the area remains bloody.  Left lower quadrant pain is a level 1 or 2 out of 10.  He denies any lower extremity swelling.  He denies any fever.   Past Medical History:  Diagnosis Date  . Asthma    AS A CHILD-NO INHALERS  . Cancer ([place])    testicular cancer 11/2016 per pt   . DVT (deep venous thrombosis) ([place])   . GERD (gastroesophageal reflux disease)    TUMS PRN    Past Surgical History:  Procedure Laterality Date  . ANKLE FRACTURE SURGERY Right 2004  . FINE NEEDLE ASPIRATION BIOPSY N/A 11/09/2017   Procedure: FINE NEEDLE ASPIRATION BIOPSY;  Surgeon: [name], MD;  Location: [place] [facility];  Service: Cardiovascular;  Laterality: N/A;  . IVC FILTER INSERTION    . ORCHIECTOMY Left 11/26/2015   Procedure: ORCHIECTOMY;  Surgeon: [name], MD;  Location: [facility] ORS;  Service: Urology;  Laterality: Left;  radical/Inguinal approach  . PERIPHERAL VASCULAR CATHETERIZATION N/A 12/16/2015   Procedure: [name] Cath Insertion;  Surgeon: [name], MD;  Location: [place] [facility];  Service: Cardiovascular;  Laterality: N/A;  . PERIPHERAL VASCULAR THROMBECTOMY Left 10/13/2017   Procedure: PERIPHERAL VASCULAR THROMBECTOMY;  Surgeon: [name], MD;  Location: [place] [facility];  Service: Cardiovascular;  Laterality: Left;  . [place] EXTRACTION  2017  Family History  Problem Relation Age of Onset  . Skin cancer Mother   . Breast cancer Maternal Grandmother   . Colon cancer Maternal Grandfather   . Diabetes Maternal Grandfather   . Emphysema Father   . Mental illness Sister        anxiety  . Stroke Paternal Grandmother   . Prostate cancer Neg Hx   . Kidney cancer Neg Hx   . Bladder Cancer Neg Hx     Social History:   reports that he has been smoking cigarettes. He has a 4.00 pack-year smoking history. He has quit using smokeless tobacco.  His smokeless tobacco use included snuff. He reports that he has current or past drug history. Drug: Marijuana. He reports that he does not drink alcohol.  He smokes 1/2 pack a day.  He smokes marijuana.  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  He has a 80 year-old-daughter named Edgar [name]. The patient's mother's cell phone number is [phone].  Another contact number is [phone].  The patient lives in [place]. He is alone today.   Allergies: No Known Allergies  Current Medications: Current Outpatient Medications  Medication Sig Dispense Refill  . clonazePAM (KLONOPIN) 0.5 MG disintegrating tablet Take 1 tablet (0.5 mg total) by mouth 2 (two) times daily. 60 tablet 0  . gabapentin (NEURONTIN) 300 MG capsule Take 300 mg by mouth at bedtime.    . magnesium oxide (MAG-OX) 400 MG tablet Take 400 mg by mouth daily.    . Oxycodone HCl 10 MG TABS Take 1 tablet (10 mg total) by mouth every 8 (eight) hours as needed. 30 tablet 0  . potassium chloride (K-DUR) 10 MEQ tablet Take 10 mEq by mouth 2 (two) times daily.    Alveda Reasons 20 MG TABS tablet Take 1 tablet by mouth daily.    Marland Kitchen omeprazole (PRILOSEC) 20 MG capsule Take 1 capsule (20 mg total) by mouth daily. 30 capsule 1  . predniSONE (DELTASONE) 10 MG tablet Take 3 tablets (30 mg total) by mouth 2 (two) times daily with a meal. 50 tablet 0   No current facility-administered medications for this visit.    Facility-Administered Medications Ordered in Other Visits  Medication Dose Route Frequency Provider Last Rate Last Dose  . heparin lock flush 100 unit/mL  500 Units Intravenous Once Michie Molnar C, MD      . heparin lock flush 100 unit/mL  500 Units Intravenous Once Scotland Dost C, MD      . sodium chloride flush (NS) 0.9 % injection 10 mL  10 mL Intravenous PRN [name], MD   10 mL at  10/21/17 1008    Review of Systems  Constitutional: Positive for malaise/fatigue. Negative for chills, diaphoresis, fever and weight loss (up 14 pounds).       Feels good.  Fatigue.  HENT: Negative for congestion, ear discharge, ear pain, hearing loss, nosebleeds, sinus pain, sore throat and tinnitus.   Eyes: Negative.  Negative for blurred vision, double vision, photophobia, pain and discharge.  Respiratory: Negative.  Negative for cough, hemoptysis, sputum production, shortness of breath and wheezing.   Cardiovascular: Negative.  Negative for chest pain, palpitations, orthopnea, claudication and leg swelling (resolved).  Gastrointestinal: Positive for abdominal pain (LLQ 1-2 out of 10) and heartburn. Negative for blood in stool, constipation, diarrhea, nausea and vomiting.       Eating a lot on steroids.  Genitourinary: Negative.  Negative for dysuria, flank pain, frequency, hematuria  and urgency.  Musculoskeletal: Negative for back pain, falls, joint pain, myalgias and neck pain.       Right foot pain (plantar surface) s/p nail puncture without pain. Left calf pain 3 out of 10.  Skin: Negative for itching.       Left calf lesion has improved.  Neurological: Negative for dizziness, tingling, tremors, sensory change, speech change, focal weakness, loss of consciousness, weakness and headaches.  Endo/Heme/Allergies: Negative.   Psychiatric/Behavioral: Negative for depression and memory loss. The patient is not nervous/anxious and does not have insomnia.    Performance status (ECOG): 1-2  Vital Signs BP 124/82 (BP Location: Left Arm, Patient Position: Sitting)   Pulse (!) 112 Comment: Patient came up the stairs  Temp (!) 97.2 F (36.2 C) (Tympanic)   Resp 18   Wt 288 lb 4 oz (130.7 kg)   BMI 37.01 kg/m   Physical Exam  Constitutional: He is oriented to person, place, and time and well-developed, well-nourished, and in no distress.  Slightly fatigued appearing.  HENT:  Head:  Normocephalic and atraumatic.  Left Ear: External ear normal.  Nose: Nose normal.  Mouth/Throat: Oropharynx is clear and moist. Abnormal dentition: poor dentition. No oropharyngeal exudate.  Alopecia.  Poor dentition.  Eyes: Pupils are equal, round, and reactive to light. Conjunctivae and EOM are normal. Right eye exhibits no discharge. Left eye exhibits no discharge. No scleral icterus.  Brown eyes.  Neck: Normal range of motion. Neck supple. No JVD present.  Cardiovascular: Normal rate, regular rhythm and normal heart sounds. Exam reveals no gallop and no friction rub.  No murmur (soft flow murmur) heard. Tachycardia.  Pulmonary/Chest: Effort normal and breath sounds normal. No respiratory distress. He has no wheezes. He has no rales.  Abdominal: Soft. Bowel sounds are normal. He exhibits no distension and no mass. There is no tenderness. There is no rebound and no guarding.  Musculoskeletal: Normal range of motion. He exhibits no edema, tenderness or deformity.       Right ankle: He exhibits swelling. Tenderness: Dorsal aspect of RIGHT foot 2/2 puncture wound.  Lymphadenopathy:    He has no cervical adenopathy.    He has no axillary adenopathy.       Right: No supraclavicular adenopathy present.       Left: No supraclavicular adenopathy present.  Neurological: He is alert and oriented to person, place, and time. Gait normal.  Skin: Skin is warm and dry. Lesion (Circular lesions to LEFT thigh well healed.) noted. No pallor.  Dressing removed.  Left calf lesion 4 x 5.5 cm bloody. Right calf biopsy site unremarkable.  Psychiatric: Mood, affect and judgment normal.  Nursing note and vitals reviewed.  Imaging studies: 11/28/2015:  Chest, abdomen, and pelvic CT revealed no mediastinal adenopathy or suspicious pulmonary nodules.   There were 2 prominent left periaortic retroperitoneal lymph nodes (1.7 cm and 0.9 cm) concerning for testicular nodal metastasis.  There were no abnormal lymph  nodes above the renal veins.  There were postsurgical change in the left hemiscrotum and left inguinal canal.  There was a 3.3 cm soft tissue abnormality anterior to the left iliopsoas muscle which could represent a metastatic lymph node (N2 lesion).  12/17/2015:  Head MRI revealed no evidence of metastatic disease.   03/30/2016:  Chest, abdomen, and pelvic CT revealed interval resolution of left abdominal/pelvic lymphadenopathy.  There was no residual metastatic disease. 09/30/2016:  Chest, abdomen, and pelvic CT revealed no evidence of recurrent or metastatic disease.  09/10/2017:  Bilateral lower extremity duplex revealed evidence of obstruction in the left CFV, SFJ, and proximal femoral vein.  09/10/2017:  CT pelvic venogram revealed multiple retroperitoneal masses including large mass within the left iliacus concerning for metastatic disease in the setting of prior testicular cancer.  Mass in the left iliacus caused significant narrowing of the proximal left external iliac vein. There was filling defect within the distal left external iliac vein extending into the common femoral and proximal superficial femoral veins consistent with thrombus  09/11/2017:  Chest, abdomen, and pelvic CT revealed enlarged and morphologically abnormal 4.3 x 4.9 cm left pelvic sidewall and retroperitoneal lymph nodes (measuring up to 2.9 cm) concerning for metastatic disease given history of testicular cancer. Biopsy was suggested for confirmation. Prominent left inguinal lymph nodes could be metastatic or reactive in nature.  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  There was anasarca/swelling of the left lower extremity.  There was no evidence of metastatic disease in the chest. 09/13/2017:  Head MRI revealed no evidence of metastatic disease. 11/17/2017:  Right foot MRI revealed artifact in the plantar soft tissues over the first metatarsal head  likely representing metallic foreign bodies as seen on previous radiographs.  There was an area of increased T2 signal intensity in the plantar aspect of the first metatarsal head with focal enhancement may indicate an area of focal osteomyelitis.  There was no soft tissue abscess. 12/07/2017:  Abdomen and pelvic CTrevealed no new or progressive metastatic disease.There was no acute vascular abnormality. IVC filter and left iliac venous stent graft werein place. There was persistent postsurgical fat stranding throughout the leftinguinal region without fluid collection 12/08/2017:  Bilateral lower extremity duplex revealed new nonocclusive thrombus in the left common femoralveinalong the anterior wall measuring 3-5 mm in thickness. This wasnot flow limiting but is new since the prior study on 11/05/2017 and consistent with acute/subacute thrombus (old per Dr. Delana Meyer). There was no evidence of right lower extremity deep vein thrombosis   Appointment on 12/14/2017  Component Date Value Ref Range Status  . CRP 12/14/2017 3.9* <1.0 mg/dL Final   Performed at [facility] [address], [place], [place] [address]  . Sed Rate 12/14/2017 46* 0 - 15 mm/hr Final   Performed at [facility], [address] [place], [place] [address]  . Magnesium 12/14/2017 1.8  1.7 - 2.4 mg/dL Final   Performed at [facility], [address], [place],  [address]  . Sodium 12/14/2017 139  135 - 145 mmol/L Final  . Potassium 12/14/2017 3.9  3.5 - 5.1 mmol/L Final  . Chloride 12/14/2017 103  98 - 111 mmol/L Final  . CO2 12/14/2017 26  22 - 32 mmol/L Final  . Glucose, Bld 12/14/2017 95  70 - 99 mg/dL Final  . BUN 12/14/2017 21* 6 - 20 mg/dL Final  . Creatinine, Ser 12/14/2017 0.90  0.61 - 1.24 mg/dL Final  . Calcium 12/14/2017 9.2  8.9 - 10.3 mg/dL Final  . GFR calc non Af Amer 12/14/2017 >60  >60 mL/min Final  . GFR calc Af Amer 12/14/2017 >60  >60 mL/min Final   Comment:  (NOTE) The eGFR has been calculated using the CKD EPI equation. This calculation has not been validated in all clinical situations. eGFR's persistently <60 mL/min signify possible Chronic Kidney Disease.   . Anion gap 12/14/2017 10  5 - 15 Final   Performed at [facility], [address], [place], [place] [address]  . WBC 12/14/2017 13.2* 4.0 - 10.5 K/uL Final   ADJUSTED FOR NUCLEATED RBC'S  . RBC 12/14/2017 2.65* 4.40 - 5.90 MIL/uL Final  . Hemoglobin 12/14/2017 8.3* 13.0 - 18.0 g/dL Final  . HCT 12/14/2017 25.2* 40.0 - 52.0 % Final  . MCV 12/14/2017 95.1  80.0 - 100.0 fL Final  . MCH 12/14/2017 31.5  26.0 - 34.0 pg Final  . MCHC 12/14/2017 33.1  32.0 - 36.0 g/dL Final  . RDW 12/14/2017 16.1* 11.5 - 14.5 % Final  . Platelets 12/14/2017 244  150 - 440 K/uL Final  . Neutrophils Relative % 12/14/2017 76  % Final  . Neutro Abs 12/14/2017 11.1* 1.4 - 6.5 K/uL Final  . Lymphocytes Relative 12/14/2017 14  % Final  . Lymphs Abs 12/14/2017 2.1  1.0 - 3.6 K/uL Final  . Monocytes Relative 12/14/2017 10  % Final  . Monocytes Absolute 12/14/2017 1.4* 0.2 - 1.0 K/uL Final  . Eosinophils Relative 12/14/2017 0  % Final  . Eosinophils Absolute 12/14/2017 0.0  0 - 0.7 K/uL Final  . Basophils Relative 12/14/2017 0  % Final  . Basophils Absolute 12/14/2017 0.1  0 - 0.1 K/uL Final  . WBC Morphology 12/14/2017 CONSISTANT WITH UDENYCA ADMIN 11/29/2017   Corrected   Comment: MODERATE LEFT SHIFT (>5% METAS AND MYELOS,OCC PRO NOTED) CONSITSTANT WITH PATH REVIEW 12/07/2017   . RBC Morphology 12/14/2017 MIXED RBC POPULATION   Corrected   Comment: POLYCHROMASIA PRESENT BASOPHILIC STIPPLING   . Smear Review 12/14/2017 PLATELET CLUMPS NOTED ON SMEAR, COUNT APPEARS ADEQUATE   Corrected  . nRBC 12/14/2017 11* 0 /100 WBC Corrected   Comment: CONSISTANT WITH  PATH REVIEW FROM 11/12/2017 Performed at [facility], [address] [place], [place] [address]     Assessment:  GRISELDA TOSH is a  27 y.o. male with recurrent testicular cancer.  He presented with a left lower extremity DVT on 09/10/2017.  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  Clinical/pathologic stage is T1 N1-2 S0 M0.  He received 3 cycles of  BEP (12/30/2015 - 02/24/2016).  He received OnPro Neulasta with cycle #2 and #3.  He declined evaluation for retroperitoneal lymph node dissection (RPLND).  He declined sperm banking.  Pre surgery labs on 11/20/2015 revealed an AFP of 411.9, beta-HCG 2,669, and LDH 522 (121-224).    Tumor markers have been followed:   AFP was 411.9 (0-8.3) on 11/20/2015, 11.2 on 12/19/2015, 6.3 on 12/23/2015, 1.3 on 01/27/2016, 1.8 on 02/17/2016, 1.9 on 03/30/2016, 1.4 on 05/27/2016, 1.0 on 07/29/2016, 1.4 on 10/14/2016, 3.3 on 12/24/2016, 1 on 09/10/2017, 1 on 09/20/2017,  2.6 on 10/21/2017, and 2.1 on 11/16/2017.    Beta-HCG was 2,669 (0-3) on 11/20/2015, 2 on 12/19/2015, 1 on 12/23/2015, < 1 on 01/27/2016, < 1 on 02/17/2016, < 1 on 03/30/2016, < 1 on 05/27/2016,  < 1 on 07/29/2016, < 1 on 10/14/2016, and 3 on 12/24/2016, < 5 on 09/11/2017,  < 5 on 09/20/2017, < 1 on 10/21/2017, and < 1 on 11/16/2017.  LDH was 522 (98-192) on 11/20/2015, 179 on 12/19/2015, 160 on 12/23/2015, 202 on 01/27/2016, 156 on 02/10/2016, 161 on 02/17/2016, 172 on 03/30/2016, 178 on 05/27/2016, 150 on 07/29/2016, 152 on 10/14/2016, 145 on 12/24/2016, 1557 on 09/11/2017, 916  on 09/20/2017, 144 on 10/21/2017, and 158 on 11/16/2017.  Chest, abdomen, and pelvic CT on 09/11/2017 revealed enlarged and morphologically abnormal 4.3 x 4.9 cm left pelvic sidewall and retroperitoneal lymph nodes (measuring up to 2.9 cm).  There was persistent left external iliac vein thrombus, better evaluated on prior CT venogram  dated 09/10/2017.  There was anasarca/swelling of the left lower extremity.  There was no evidence of metastatic disease in the chest.  Head MRI on 09/13/2017 revealed no evidence of metastatic disease.   He has a left lower extremity DVT.  Bilateral lower extremity duplex on 09/10/2017 revealed evidence of obstruction in the left CFV, SFJ, and proximal femoral vein.  He is on Xarelto.  He is currently day 23 s/p cycle #3 TIP chemotherapy (09/21/2017 - 11/27/2017) with Margarette Canada support. Cycle #1 was administered at [facility]. Cycles #2 and 3 were administered at [facility].  Both cycles 1 and 2 were complicated by lower extremity skin lesions on recovery of counts.  Audiogram on 10/27/2017 revealed hearing within normal limits.  He was diagnosed with cellulitis of the left lower extremity on 10/06/2017.  He was on clindamycin.  While hospitalized (10/08/2017 - 10/16/2017), he received 9 days of vancomycin and Cefepime.  He was discharged on Flagyl, doxycycline, and Levaquin.  He was admitted to [facility] from 10/08/2017 - 10/16/2017 with left lower extremity cellulitis.  He was treated with Cefepime and vancomycin.  Imaging revealed significant improvement in adenopathy with decreased pelvic sidewall disease and decreased bulk at aortic bifurcation.  He was felt to have venous congestion syndrome with skin lesions due to congestion.   He was admitted to [facility] oncology floor from 10/21/2017 - 10/26/2017 for inpatient chemotherapy (cycle #2 TIP).    He was admitted to [facility] from 11/05/2017 - 11/09/2017 for cellulitis.  He was developing similar lower extremity skin lesions following his first cycle of chemotherapy.  Skin biopsy revealed diffuse neutrophilic dermatitis. Cultures were negative.  Xarelto was held secondary to thrombocytopenia (nadir platelet count 39,000).  He was treated with heparin.  Lower extremity duplex revealed no clot.  He was treated with Cefepime and vancomycin.  He was discharged on Augmentin.   He received oral acyclovir for herpetic labialis.  He was admitted to [facility] from 11/22/2017 - 11/27/2017 for inpatient chemotherapy (cycle #3 TIP).    He was admitted to [facility] from 12/07/2017 - 12/11/2017 with fever and lower extremity edema.  He developed recurrent leg lesions. 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  He was diagnosed with Sweet's syndrome (felt due to [facility]) and began Solumedrol 80 mg IV q day on 12/09/2017.  He fevers quickly resolved.  He received 1 unit of PRBCs.  Platelets became > 50,000 on 12/11/2017 and Xarelto was restarted.  He underwent IVC filterplacement, percutaneous angioplastyand stent placementof the left common iliac vein and left external iliac vein on 10/13/2017.  He was discharged on Flagyl, doxycycline, and Levaquin.  Hypercoagulable work-up on 10/12/2017 negative for the following: Factor V Leiden, prothrombin gene mutation, lupus anticoagulant, anticardiolipin antibodies, beta-2 glycoprotein, protein C activity/antigen, protein S activity/antigen. ATIII was 54% (75-120) on heparin.  Right foot MRI on 11/17/2017 revealed artifact in the plantar soft tissues over the first metatarsal head likely representing metallic foreign bodies as seen on previous radiographs.  There was an area of increased T2 signal intensity in the plantar aspect of the first metatarsal head with focal enhancement may indicate an area of focal osteomyelitis.  There was no soft tissue abscess.  He  has a history of right lower molar abscess s/p extraction on 11/19/2017.  He was treated with amoxicillin.  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  He is on prednisone 50 mg po BID.  Symptomatically, he remains fatigued, but is improving.  He has reflux.  He is on prednisone 50 mg po BID.  Left calf lesion is 4 x 5.5 cm.  Counts have recovered.  Plan: 1. Labs today:  CBC with diff, BMP, Mg, sed rate , CRP. 2. Recurrent testicular cancer: Day 23 s/p  cycle #3 TIP chemotherapy. 3. Chemotherapy induced pancytopenia: Neutropenia: Resolved.  ANC  11,100 Anemia: Hemoglobin 8.3 and hematocrit 25.2. Anticipate increasing hemoglobin. Thrombocytopenia:  Platelets  244,000.  Patient on Xarelto. 4. Sweet's syndrome: No new lesions.  Left calf lesion s/p recent trauma. Sed rate and CRP improved. Taper prednisone to 30 mg po BID. 5. LEFT lower extremity DVT: Continue Xarelto. 6. RIGHT foot wound (nail puncture) Foot looks well. 7. Electrolyte wasting: Magnesium 1.8 and potassium 3.9. Continue outpatient potassium and magnesium. 8. Pain and symptom management Pain well controlled on oxycodone 10 mg po q 8 hrs prn. Rx: omeprazole 20 mg po q day for reflux. 9.  RTC on 12/17/2017 for MD assessment and labs (CBC with diff, BMP, Mg).   [name], MD  12/14/2017, 5:05 PM

## 2017-12-14 NOTE — Telephone Encounter (Signed)
Called patient and could not get him.  Called significant other to inquire why he did not come for his appointment this morning.  She will try to get in touch with patient.  She said he knew about appointment.

## 2017-12-14 NOTE — Progress Notes (Signed)
Patient states pain in abdomen is resolving.  Left leg painful today.  Continues to have bilateral lower extremity edema.

## 2017-12-15 ENCOUNTER — Other Ambulatory Visit: Payer: Self-pay | Admitting: Hematology and Oncology

## 2017-12-17 ENCOUNTER — Telehealth: Payer: Self-pay | Admitting: *Deleted

## 2017-12-17 ENCOUNTER — Inpatient Hospital Stay: Payer: BLUE CROSS/BLUE SHIELD

## 2017-12-17 ENCOUNTER — Inpatient Hospital Stay (HOSPITAL_BASED_OUTPATIENT_CLINIC_OR_DEPARTMENT_OTHER): Payer: BLUE CROSS/BLUE SHIELD | Admitting: Hematology and Oncology

## 2017-12-17 ENCOUNTER — Other Ambulatory Visit: Payer: Self-pay

## 2017-12-17 VITALS — BP 122/87 | HR 120 | Temp 97.9°F | Resp 18 | Wt 263.9 lb

## 2017-12-17 DIAGNOSIS — C6212 Malignant neoplasm of descended left testis: Secondary | ICD-10-CM | POA: Diagnosis not present

## 2017-12-17 DIAGNOSIS — T451X5A Adverse effect of antineoplastic and immunosuppressive drugs, initial encounter: Secondary | ICD-10-CM | POA: Diagnosis not present

## 2017-12-17 DIAGNOSIS — R112 Nausea with vomiting, unspecified: Secondary | ICD-10-CM | POA: Diagnosis not present

## 2017-12-17 DIAGNOSIS — Z800 Family history of malignant neoplasm of digestive organs: Secondary | ICD-10-CM

## 2017-12-17 DIAGNOSIS — L982 Febrile neutrophilic dermatosis [Sweet]: Secondary | ICD-10-CM | POA: Diagnosis not present

## 2017-12-17 DIAGNOSIS — C6210 Malignant neoplasm of unspecified descended testis: Secondary | ICD-10-CM

## 2017-12-17 DIAGNOSIS — Z7952 Long term (current) use of systemic steroids: Secondary | ICD-10-CM | POA: Diagnosis not present

## 2017-12-17 DIAGNOSIS — G620 Drug-induced polyneuropathy: Secondary | ICD-10-CM | POA: Diagnosis not present

## 2017-12-17 DIAGNOSIS — K219 Gastro-esophageal reflux disease without esophagitis: Secondary | ICD-10-CM

## 2017-12-17 DIAGNOSIS — R609 Edema, unspecified: Secondary | ICD-10-CM

## 2017-12-17 DIAGNOSIS — F17210 Nicotine dependence, cigarettes, uncomplicated: Secondary | ICD-10-CM | POA: Diagnosis not present

## 2017-12-17 DIAGNOSIS — Z7901 Long term (current) use of anticoagulants: Secondary | ICD-10-CM | POA: Diagnosis not present

## 2017-12-17 DIAGNOSIS — J45909 Unspecified asthma, uncomplicated: Secondary | ICD-10-CM | POA: Diagnosis not present

## 2017-12-17 DIAGNOSIS — D6481 Anemia due to antineoplastic chemotherapy: Secondary | ICD-10-CM

## 2017-12-17 DIAGNOSIS — Z803 Family history of malignant neoplasm of breast: Secondary | ICD-10-CM

## 2017-12-17 DIAGNOSIS — I82412 Acute embolism and thrombosis of left femoral vein: Secondary | ICD-10-CM

## 2017-12-17 DIAGNOSIS — D61810 Antineoplastic chemotherapy induced pancytopenia: Secondary | ICD-10-CM

## 2017-12-17 DIAGNOSIS — E876 Hypokalemia: Secondary | ICD-10-CM

## 2017-12-17 DIAGNOSIS — Z86718 Personal history of other venous thrombosis and embolism: Secondary | ICD-10-CM | POA: Diagnosis not present

## 2017-12-17 DIAGNOSIS — R1032 Left lower quadrant pain: Secondary | ICD-10-CM

## 2017-12-17 DIAGNOSIS — Z79899 Other long term (current) drug therapy: Secondary | ICD-10-CM | POA: Diagnosis not present

## 2017-12-17 DIAGNOSIS — Z7689 Persons encountering health services in other specified circumstances: Secondary | ICD-10-CM

## 2017-12-17 DIAGNOSIS — Z801 Family history of malignant neoplasm of trachea, bronchus and lung: Secondary | ICD-10-CM

## 2017-12-17 LAB — BASIC METABOLIC PANEL
Anion gap: 10 (ref 5–15)
BUN: 19 mg/dL (ref 6–20)
CO2: 30 mmol/L (ref 22–32)
Calcium: 9.8 mg/dL (ref 8.9–10.3)
Chloride: 102 mmol/L (ref 98–111)
Creatinine, Ser: 1.06 mg/dL (ref 0.61–1.24)
GFR calc Af Amer: >60 mL/min (ref >60)
GFR calc non Af Amer: >60 mL/min (ref >60)
Glucose, Bld: 97 mg/dL (ref 70–99)
Potassium: 3.5 mmol/L (ref 3.5–5.1)
Sodium: 142 mmol/L (ref 135–145)

## 2017-12-17 LAB — CBC WITH DIFFERENTIAL/PLATELET
Basophils Absolute: 0 10*3/uL (ref 0–0.1)
Basophils Relative: 0 %
Eosinophils Absolute: 0 10*3/uL (ref 0–0.7)
Eosinophils Relative: 0 %
HCT: 28.3 % — ABNORMAL LOW (ref 40.0–52.0)
Hemoglobin: 9.5 g/dL — ABNORMAL LOW (ref 13.0–18.0)
Lymphocytes Relative: 14 %
Lymphs Abs: 1.6 10*3/uL (ref 1.0–3.6)
MCH: 32.6 pg (ref 26.0–34.0)
MCHC: 33.6 g/dL (ref 32.0–36.0)
MCV: 96.9 fL (ref 80.0–100.0)
Monocytes Absolute: 1.5 10*3/uL — ABNORMAL HIGH (ref 0.2–1.0)
Monocytes Relative: 13 %
Neutro Abs: 8.3 10*3/uL — ABNORMAL HIGH (ref 1.4–6.5)
Neutrophils Relative %: 73 %
Platelets: 361 10*3/uL (ref 150–440)
RBC: 2.92 MIL/uL — ABNORMAL LOW (ref 4.40–5.90)
RDW: 17.7 % — ABNORMAL HIGH (ref 11.5–14.5)
WBC: 11.4 10*3/uL — ABNORMAL HIGH (ref 3.8–10.6)

## 2017-12-17 LAB — MAGNESIUM: Magnesium: 2.1 mg/dL (ref 1.7–2.4)

## 2017-12-17 NOTE — Progress Notes (Signed)
Here for follow up. 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

## 2017-12-17 NOTE — Progress Notes (Addendum)
[facility] day:  12/17/2017  Chief Complaint: Edgar [name] is a 27 y.o. male with recurrent testicular cancer who is seen for assessment on day 26 of cycle #3 TIP chemotherapy.  HPI: The patient was last seen in the medical oncology clinic on 12/14/2017.  At that time, he was feeling better.  His left calf was healing slowly.  He was taking prednisone 50 mg BID.  Dose was decreased to 30 mg BID.  He was started on omeprazole.  Counts included a hematocrit of 25.2, hemoglobin 8.3, MCV 95.1, platelets 244,000, WBC 13,200 with an ANC of 11,100.  CRP had improved from 26.9 to 3.9.  Sed rate had improved from 127 to 46.  During the interim, patient feels "pretty good". He states, "I always feel good the Friday before chemo, which sucks". Patient remains on prednisone as prescribed. He notes that the steroids cause him to eat more and have trouble sleeping. Patient has been more active today. He says that he has been outside washing his car and "messing around" today.   Patient denies nausea, vomiting, and changes to his bowel habits. Patient advising that his abdominal pain has resolved. Patient continues to have swelling in his lower extremities, but this has also improved. Lesion to LEFT posterior leg with significant amount of sanguinopurulent drainage. He notes that "the nurse in the hospital" covered his wound with a dry bandage. When he removed the bandaged, the dressing material "ripped the blister open". Area measures 4 x 5 cm. Patient continues daily wound care using SSD cream, Xeroform, and compression bandages.   Patient has not experienced any fevers, sweats, or significant weight loss. Patient advises that he maintains an adequate appetite. He is eating well. Weight today is 263 lb 14.4 oz (119.7 kg). Question the accuracy of the previously documented weight on 12/14/2017.  Patient complains of pain rated 3/10 in the clinic today.   Past  Medical History:  Diagnosis Date  . Asthma    AS A CHILD-NO INHALERS  . Cancer ([place])    testicular cancer 11/2016 per pt   . DVT (deep venous thrombosis) ([place])   . GERD (gastroesophageal reflux disease)    TUMS PRN    Past Surgical History:  Procedure Laterality Date  . ANKLE FRACTURE SURGERY Right 2004  . FINE NEEDLE ASPIRATION BIOPSY N/A 11/09/2017   Procedure: FINE NEEDLE ASPIRATION BIOPSY;  Surgeon: [name], MD;  Location: [facility];  Service: Cardiovascular;  Laterality: N/A;  . IVC FILTER INSERTION    . ORCHIECTOMY Left 11/26/2015   Procedure: ORCHIECTOMY;  Surgeon: [name], MD;  Location: [facility];  Service: Urology;  Laterality: Left;  radical/Inguinal approach  . PERIPHERAL VASCULAR CATHETERIZATION N/A 12/16/2015   Procedure: [name] Cath Insertion;  Surgeon: [name], MD;  Location: [facility];  Service: Cardiovascular;  Laterality: N/A;  . PERIPHERAL VASCULAR THROMBECTOMY Left 10/13/2017   Procedure: PERIPHERAL VASCULAR THROMBECTOMY;  Surgeon: [name], MD;  Location: [facility];  Service: Cardiovascular;  Laterality: Left;  . WISDOM TOOTH EXTRACTION  2017    Family History  Problem Relation Age of Onset  . Skin cancer Mother   . Breast cancer Maternal Grandmother   . Colon cancer Maternal Grandfather   . Diabetes Maternal Grandfather   . Emphysema Father   . Mental illness Sister        anxiety  . Stroke Paternal Grandmother   .  Prostate cancer Neg Hx   . Kidney cancer Neg Hx   . Bladder Cancer Neg Hx     Social History:  reports that he has been smoking cigarettes. He has a 4.00 pack-year smoking history. He has quit using smokeless tobacco.  His smokeless tobacco use included snuff. He reports that he has current or past drug history. Drug: Marijuana. He reports that he does not drink alcohol.  He smokes 1/2 pack a day.  He smokes marijuana.  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  He  has a 9 year-old-daughter named Edgar [name]. The patient's mother's cell phone number is [phone].  Another contact number is [phone].  The patient lives in [place]. He is alone today.   Allergies: No Known Allergies  Current Medications: Current Outpatient Medications  Medication Sig Dispense Refill  . clonazePAM (KLONOPIN) 0.5 MG disintegrating tablet Take 1 tablet (0.5 mg total) by mouth 2 (two) times daily. 60 tablet 0  . gabapentin (NEURONTIN) 300 MG capsule Take 300 mg by mouth at bedtime.    . magnesium oxide (MAG-OX) 400 MG tablet Take 400 mg by mouth daily.    Marland Kitchen omeprazole (PRILOSEC) 20 MG capsule Take 1 capsule (20 mg total) by mouth daily. 30 capsule 1  . Oxycodone HCl 10 MG TABS Take 1 tablet (10 mg total) by mouth every 8 (eight) hours as needed. 30 tablet 0  . potassium chloride (K-DUR) 10 MEQ tablet Take 10 mEq by mouth 2 (two) times daily.    . predniSONE (DELTASONE) 10 MG tablet Take 3 tablets (30 mg total) by mouth 2 (two) times daily with a meal. 50 tablet 0  . XARELTO 20 MG TABS tablet Take 1 tablet by mouth daily.     No current facility-administered medications for this visit.    Facility-Administered Medications Ordered in Other Visits  Medication Dose Route Frequency Provider Last Rate Last Dose  . heparin lock flush 100 unit/mL  500 Units Intravenous Once Corcoran, [name], MD      . heparin lock flush 100 unit/mL  500 Units Intravenous Once Corcoran, [name], MD      . sodium chloride flush (NS) 0.9 % injection 10 mL  10 mL Intravenous PRN [name], MD   10 mL at 10/21/17 1008    Review of Systems  Constitutional: Negative for diaphoresis, fever, malaise/fatigue and weight loss.       "I always feel goof the Friday before chemo".  Energy and appetite good; on steroids.   HENT: Negative.   Eyes: Negative.   Respiratory: Negative for cough, hemoptysis, sputum production and shortness of breath.   Cardiovascular: Positive for leg swelling  (improved). Negative for chest pain, palpitations, orthopnea and PND.       TACHYcardic  Gastrointestinal: Negative for abdominal pain (resolved), blood in stool, constipation, diarrhea, melena, nausea and vomiting.  Genitourinary: Negative for dysuria, frequency, hematuria and urgency.  Musculoskeletal: Negative for back pain, falls, joint pain and myalgias.       Pain to plantar surface of RIGHT foot s/p previous puncture wound.  Skin: Negative for itching and rash.       (+) pallor. Open lesion to posterior aspect of LLE. Lesions to LEFT inner thigh healing/fading.  Neurological: Negative for dizziness, tremors, weakness and headaches.  Endo/Heme/Allergies: Does not bruise/bleed easily.  Psychiatric/Behavioral: Negative for depression, memory loss and suicidal ideas. The patient has insomnia (steroid induced). The patient is not nervous/anxious.   All  other systems reviewed and are negative.  Performance status (ECOG): 1 - 2  Vital Signs BP 122/87 (BP Location: Left Arm, Patient Position: Sitting)   Pulse (!) 120 Comment: pt ran up stairs /late to appt/running he stated  Temp 97.9 F (36.6 C) (Tympanic)   Resp 18   Wt 263 lb 14.4 oz (119.7 kg)   BMI 33.88 kg/m   Physical Exam  Constitutional: He is oriented to person, place, and time and well-developed, well-nourished, and in no distress.  HENT:  Head: Normocephalic and atraumatic. Hair is normal.  Mouth/Throat: Abnormal dentition (poor dentition).  Alopecia.  Eyes: Pupils are equal, round, and reactive to light. EOM are normal. No scleral icterus.  Brown eyes  Neck: Normal range of motion. Neck supple. No tracheal deviation present. No thyromegaly present.  Cardiovascular: Regular rhythm. Tachycardia present. Exam reveals no gallop and no friction rub.  No murmur heard. Pulmonary/Chest: Effort normal and breath sounds normal. No respiratory distress. He has no wheezes. He has no rales.  Abdominal: Soft. Bowel sounds are  normal. He exhibits no distension and no mass. There is no tenderness. There is no rebound and no guarding.  Musculoskeletal: Normal range of motion. He exhibits no edema or tenderness.  RIGHT foot without signs of infection.   Lymphadenopathy:    He has no cervical adenopathy.    He has no axillary adenopathy.       Right: No inguinal and no supraclavicular adenopathy present.       Left: No inguinal and no supraclavicular adenopathy present.  Neurological: He is alert and oriented to person, place, and time. Gait normal.  Skin: Skin is warm and dry. No rash noted. No erythema. No pallor.     Psychiatric: Mood, affect and judgment normal.  Nursing note and vitals reviewed.   Medical photography: Lesion to posterior aspect of LEFT lower extremity (photo taken on 12/17/2017).     Imaging studies: 11/28/2015:  Chest, abdomen, and pelvic CT revealed no mediastinal adenopathy or suspicious pulmonary nodules.   There were 2 prominent left periaortic retroperitoneal lymph nodes (1.7 cm and 0.9 cm) concerning for testicular nodal metastasis.  There were no abnormal lymph nodes above the renal veins.  There were postsurgical change in the left hemiscrotum and left inguinal canal.  There was a 3.3 cm soft tissue abnormality anterior to the left iliopsoas muscle which could represent a metastatic lymph node (N2 lesion).  12/17/2015:  Head MRI revealed no evidence of metastatic disease.   03/30/2016:  Chest, abdomen, and pelvic CT revealed interval resolution of left abdominal/pelvic lymphadenopathy.  There was no residual metastatic disease. 09/30/2016:  Chest, abdomen, and pelvic CT revealed no evidence of recurrent or metastatic disease.  09/10/2017:  Bilateral lower extremity duplex revealed evidence of obstruction in the left CFV, SFJ, and proximal femoral vein.  09/10/2017:  CT pelvic venogram revealed multiple retroperitoneal masses including large mass within the left iliacus concerning for  metastatic disease in the setting of prior testicular cancer.  Mass in the left iliacus caused significant narrowing of the proximal left external iliac vein. There was filling defect within the distal left external iliac vein extending into the common femoral and proximal superficial femoral veins consistent with thrombus  09/11/2017:  Chest, abdomen, and pelvic CT revealed enlarged and morphologically abnormal 4.3 x 4.9 cm left pelvic sidewall and retroperitoneal lymph nodes (measuring up to 2.9 cm) concerning for metastatic disease given history of testicular cancer. Biopsy was suggested for confirmation. Prominent left inguinal  lymph nodes could be metastatic or reactive in nature.  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  There was anasarca/swelling of the left lower extremity.  There was no evidence of metastatic disease in the chest. 09/13/2017:  Head MRI revealed no evidence of metastatic disease. 11/17/2017:  Right foot MRI revealed artifact in the plantar soft tissues over the first metatarsal head likely representing metallic foreign bodies as seen on previous radiographs.  There was an area of increased T2 signal intensity in the plantar aspect of the first metatarsal head with focal enhancement may indicate an area of focal osteomyelitis.  There was no soft tissue abscess. 12/07/2017:  Abdomen and pelvic CTrevealed no new or progressive metastatic disease.There was no acute vascular abnormality. IVC filter and left iliac venous stent graft werein place. There was persistent postsurgical fat stranding throughout the leftinguinal region without fluid collection 12/08/2017:  Bilateral lower extremity duplex revealed new nonocclusive thrombus in the left common femoralveinalong the anterior wall measuring 3-5 mm in thickness. This wasnot flow limiting but is new since the prior study on 11/05/2017 and consistent with  acute/subacute thrombus (old per Dr. Delana Meyer). There was no evidence of right lower extremity deep vein thrombosis   Admissions: He was admitted to [facility] from 10/08/2017 - 10/16/2017 with left lower extremity cellulitis.  He was treated with Cefepime and vancomycin.  Imaging revealed significant improvement in adenopathy with decreased pelvic sidewall disease and decreased bulk at aortic bifurcation.  He was felt to have venous congestion syndrome with skin lesions due to congestion.   He was admitted to [facility] from 10/21/2017 - 10/26/2017 for inpatient chemotherapy (cycle #2 TIP).    He was admitted to [facility] from 11/05/2017 - 11/09/2017 for cellulitis.  He was developing similar lower extremity skin lesions following his first cycle of chemotherapy.  Skin biopsy revealed diffuse neutrophilic dermatitis. Cultures were negative.  Xarelto was held secondary to thrombocytopenia (nadir platelet count 39,000).  He was treated with heparin.  Lower extremity duplex revealed no clot.  He was treated with Cefepime and vancomycin.  He was discharged on Augmentin.  He received oral acyclovir for herpetic labialis.  He was admitted to [facility] from 11/22/2017 - 11/27/2017 for inpatient chemotherapy (cycle #3 TIP).    He was admitted to [facility] from 12/07/2017 - 12/11/2017 with fever and lower extremity edema.  He developed recurrent leg lesions. 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  He fevers quickly resolved.  He received 1 unit of PRBCs.  Platelets became > 50,000 on 12/11/2017 and Xarelto was restarted.   Appointment on 12/17/2017  Component Date Value Ref Range Status  . Magnesium 12/17/2017 2.1  1.7 - 2.4 mg/dL Final   Performed at [facility], [address], [place], [place] [address]  . Sodium 12/17/2017 142  135 - 145 mmol/L Final  . Potassium 12/17/2017 3.5  3.5 -  5.1 mmol/L Final  . Chloride 12/17/2017 102  98 - 111 mmol/L Final  . CO2 12/17/2017 30  22 - 32 mmol/L Final  . Glucose, Bld 12/17/2017 97  70 - 99 mg/dL Final  . BUN 12/17/2017 19  6 - 20 mg/dL Final  . Creatinine, Ser 12/17/2017 1.06  0.61 - 1.24 mg/dL Final  . Calcium 12/17/2017 9.8  8.9 - 10.3 mg/dL Final  . GFR calc non  Af Amer 12/17/2017 >60  >60 mL/min Final  . GFR calc Af Amer 12/17/2017 >60  >60 mL/min Final   Comment: (NOTE) The eGFR has been calculated using the CKD EPI equation. This calculation has not been validated in all clinical situations. eGFR's persistently <60 mL/min signify possible Chronic Kidney Disease.   Georgiann Hahn gap 12/17/2017 10  5 - 15 Final   Performed at [facility], [address] [place], [place] [address]  . WBC 12/17/2017 11.4* 3.8 - 10.6 K/uL Final  . RBC 12/17/2017 2.92* 4.40 - 5.90 MIL/uL Final  . Hemoglobin 12/17/2017 9.5* 13.0 - 18.0 g/dL Final  . HCT 12/17/2017 28.3* 40.0 - 52.0 % Final  . MCV 12/17/2017 96.9  80.0 - 100.0 fL Final  . MCH 12/17/2017 32.6  26.0 - 34.0 pg Final  . MCHC 12/17/2017 33.6  32.0 - 36.0 g/dL Final  . RDW 12/17/2017 17.7* 11.5 - 14.5 % Final  . Platelets 12/17/2017 361  150 - 440 K/uL Final  . Neutrophils Relative % 12/17/2017 73  % Final  . Neutro Abs 12/17/2017 8.3* 1.4 - 6.5 K/uL Final  . Lymphocytes Relative 12/17/2017 14  % Final  . Lymphs Abs 12/17/2017 1.6  1.0 - 3.6 K/uL Final  . Monocytes Relative 12/17/2017 13  % Final  . Monocytes Absolute 12/17/2017 1.5* 0.2 - 1.0 K/uL Final  . Eosinophils Relative 12/17/2017 0  % Final  . Eosinophils Absolute 12/17/2017 0.0  0 - 0.7 K/uL Final  . Basophils Relative 12/17/2017 0  % Final  . Basophils Absolute 12/17/2017 0.0  0 - 0.1 K/uL Final   Performed at [facility], [address], [place], [place] [address]    Assessment:  Edgar [name] is a 27 y.o. male with recurrent testicular cancer.  He presented with a left lower extremity DVT on  09/10/2017.  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  Clinical/pathologic stage is T1 N1-2 S0 M0.  He received 3 cycles of  BEP (12/30/2015 - 02/24/2016).  He received OnPro Neulasta with cycle #2 and #3.  He declined evaluation for retroperitoneal lymph node dissection (RPLND).  He declined sperm banking.  Pre surgery labs on 11/20/2015 revealed an AFP of 411.9, beta-HCG 2,669, and LDH 522 (121-224).    Tumor markers have been followed:   AFP was 411.9 (0-8.3) on 11/20/2015, 11.2 on 12/19/2015, 6.3 on 12/23/2015, 1.3 on 01/27/2016, 1.8 on 02/17/2016, 1.9 on 03/30/2016, 1.4 on 05/27/2016, 1.0 on 07/29/2016, 1.4 on 10/14/2016, 3.3 on 12/24/2016, 1 on 09/10/2017, 1 on 09/20/2017,  2.6 on 10/21/2017, and 2.1 on 11/16/2017.    Beta-HCG was 2,669 (0-3) on 11/20/2015, 2 on 12/19/2015, 1 on 12/23/2015, < 1 on 01/27/2016, < 1 on 02/17/2016, < 1 on 03/30/2016, < 1 on 05/27/2016,  < 1 on 07/29/2016, < 1 on 10/14/2016, and 3 on 12/24/2016, < 5 on 09/11/2017,  < 5 on 09/20/2017, < 1 on 10/21/2017, and < 1 on 11/16/2017.  LDH was 522 (98-192) on 11/20/2015, 179 on 12/19/2015, 160 on 12/23/2015, 202 on 01/27/2016, 156 on 02/10/2016, 161 on 02/17/2016, 172 on 03/30/2016, 178 on 05/27/2016, 150 on 07/29/2016, 152 on 10/14/2016, 145 on 12/24/2016, 1557 on 09/11/2017, 916 on 09/20/2017, 144 on 10/21/2017, and 158 on 11/16/2017.  Chest, abdomen, and pelvic CT on 09/11/2017  revealed enlarged and morphologically abnormal 4.3 x 4.9 cm left pelvic sidewall and retroperitoneal lymph nodes (measuring up to 2.9 cm).  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  There was anasarca/swelling of the left lower extremity.  There was no evidence  of metastatic disease in the chest.  Head MRI on 09/13/2017 revealed no evidence of metastatic disease.   He has a left lower extremity DVT.  Bilateral lower extremity duplex on 09/10/2017 revealed evidence of obstruction in the left CFV, SFJ, and proximal femoral vein.  He is on Xarelto.  He is currently day 26 s/p cycle #3 TIP chemotherapy (09/21/2017 - 11/27/2017) with Margarette Canada support. Cycle #1 was administered at [facility]. Cycles #2 and 3 were administered at [facility].  Both cycles 1 and 2 were complicated by lower extremity skin lesions on recovery of counts.  Audiogram on 10/27/2017 revealed hearing within normal limits.  He underwent IVC filterplacement, percutaneous angioplastyand stent placementof the left common iliac vein and left external iliac vein on 10/13/2017.  He was discharged on Flagyl, doxycycline, and Levaquin.  Hypercoagulable work-up on 10/12/2017 negative for the following: Factor V Leiden, prothrombin gene mutation, lupus anticoagulant, anticardiolipin antibodies, beta-2 glycoprotein, protein C activity/antigen, protein S activity/antigen. ATIII was 54% (75-120) on heparin.  He has a history of right lower molar abscess s/p extraction on 11/19/2017.  He was treated with amoxicillin.  He stepped on a nail with his right foot.  He was treated with antibiotics for a puncture wound.  He has been followed by podiatry.  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  He is on prednisone 30 mg po BID.  Symptomatically, he is feeling better.  Energy level has improved.  He denies any fever.  Left calf lesion is 4 x 5 cm.  Plan: 1. Labs today:  CBC with diff, BMP, Mg. 2. Recurrent testicular cancer:  Day 26 s/p cycle #3 TIP chemotherapy. 3. Chemotherapy induced pancytopenia:  Improving. 4. Sweet's syndrome:  Decrease to prednisone 30 mg a day.  Taper as tolerated.  Follow-up with infectious disease and dermatology.  Discuss use of daily GCSF versus no GCSF support  with cycle #4 TIP.  Await preauth.  Discuss concern for neutropenia (ANC 0) despite Udencya with prior cycles. 5. LEFT lower extremity DVT:  Patent remains on Xarelto. 6. RIGHT foot wound (nail puncture)  Remains tender off/on.  Patient followed by Dr. Cleda Mccreedy in podiatry.  No obvious infection although earlier concerns for possible osteomyelitis. 7. Electrolyte wasting:  Magnesium 2.1.  Continue outpatient potassium and magnesium. 8. Pain and symptom management  Pain well controlled on oxycodone 10 mg po q 8 hrs prn.  Continue omeprazole for reflux. 9.   RTC on 12/20/2017 for MD assessment, labs (CBC with diff, CMP, Mg, LDH, beta-HCG, AFP), and admission for cycle #4 TIP.   Honor Loh, NP  12/17/2017, 6:51 PM   I saw and evaluated the patient, participating in the key portions of the service and reviewing pertinent diagnostic studies and records.  I reviewed the nurse practitioner's note and agree with the findings and the plan.  The assessment and plan were discussed with the patient.  Several questions were asked by the patient and answered.   Nolon Stalls, MD 12/17/2017,6:51 PM

## 2017-12-18 ENCOUNTER — Encounter: Payer: Self-pay | Admitting: Hematology and Oncology

## 2017-12-19 NOTE — Progress Notes (Signed)
[facility] day:  12/20/2017  Chief Complaint: Edgar [name] is a 27 y.o. male with recurrent testicular cancer who is seen for assessment prior to admission for cycle #4 TIP chemotherapy.  HPI: The patient was last seen in the medical oncology clinic on 12/17/2017.  At that time, patient was feeling "pretty good".  He remained on prescribed prednisone, which was causing him to experience polyphagia and insomnia.  Remained active.  No nausea or vomiting.  Denied fevers.  Abdominal pain had resolved.  Continue to have lower extremity swelling.  Exam revealed 4 x 5 cm lesion to the LEFT posterior leg with a significant amount of sanguinopurulent drainage secondary to drug-induced neutrophilic dermatosis (Sweet's syndrome).   CBC on 12/17/2017 revealed a WBC of 11,400 (Forbes 8300). Hemoglobin 9.5, hematocrit 28.3, MCV 96.6, and platelets 361,000. Chemistries were all normal.   In the interim, patient has been doing well since his last visit. He has had some nausea and vomiting this morning. Right foot tenderness and swelling has improved. Patient has more pain in wound to his LEFT posterior leg. Wound care being performed BID (SSD + Xeroform + compression wraps).   Patient denies fevers, sweats, bowel changes, and significant weight loss. Patient advises that he maintains an adequate appetite. He is eating well. Weight today is 267 lb 3.2 oz (121.2 kg), which compared to his last visit to the clinic, represents a  4 pound increase.   Patient complains of pain rated 5/10 in the clinic today.   Past Medical History:  Diagnosis Date  . Asthma    AS A CHILD-NO INHALERS  . Cancer ([place])    testicular cancer 11/2016 per pt   . DVT (deep venous thrombosis) ([place])   . GERD (gastroesophageal reflux disease)    TUMS PRN    Past Surgical History:  Procedure Laterality Date  . ANKLE FRACTURE SURGERY Right 2004  . FINE NEEDLE ASPIRATION BIOPSY N/A 11/09/2017   Procedure: FINE NEEDLE ASPIRATION BIOPSY;  Surgeon: [name], MD;  Location: [facility];  Service: Cardiovascular;  Laterality: N/A;  . IVC FILTER INSERTION    . ORCHIECTOMY Left 11/26/2015   Procedure: ORCHIECTOMY;  Surgeon: [name], MD;  Location: [facility];  Service: Urology;  Laterality: Left;  radical/Inguinal approach  . PERIPHERAL VASCULAR CATHETERIZATION N/A 12/16/2015   Procedure: [name] Cath Insertion;  Surgeon: [name], MD;  Location: [facility];  Service: Cardiovascular;  Laterality: N/A;  . PERIPHERAL VASCULAR THROMBECTOMY Left 10/13/2017   Procedure: PERIPHERAL VASCULAR THROMBECTOMY;  Surgeon: [name], MD;  Location: [facility];  Service: Cardiovascular;  Laterality: Left;  . WISDOM TOOTH EXTRACTION  2017    Family History  Problem Relation Age of Onset  . Skin cancer Mother   . Breast cancer Maternal Grandmother   . Colon cancer Maternal Grandfather   . Diabetes Maternal Grandfather   . Emphysema Father   . Mental illness Sister        anxiety  . Stroke Paternal Grandmother   . Prostate cancer Neg Hx   . Kidney cancer Neg Hx   . Bladder Cancer Neg Hx     Social History:  reports that he has been smoking cigarettes. He has a 4.00 pack-year smoking history. He has quit using smokeless tobacco.  His smokeless tobacco use included snuff. He reports that he has current or past drug history. Drug: Marijuana. He reports that he does  not drink alcohol.  He smokes 1/2 pack a day.  He smokes marijuana.  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  He has a 71 year-old-daughter named [name]. The patient's mother's cell phone number is [phone].  Another contact number is [phone].  The patient lives in [place]. He is alone today.   Allergies: No Known Allergies  Current Medications: Current Outpatient Medications  Medication Sig Dispense Refill  . clonazePAM (KLONOPIN) 0.5 MG disintegrating tablet  Take 1 tablet (0.5 mg total) by mouth 2 (two) times daily. 60 tablet 0  . gabapentin (NEURONTIN) 300 MG capsule Take 300 mg by mouth at bedtime.    . magnesium oxide (MAG-OX) 400 MG tablet Take 400 mg by mouth daily.    Marland Kitchen omeprazole (PRILOSEC) 20 MG capsule Take 1 capsule (20 mg total) by mouth daily. 30 capsule 1  . Oxycodone HCl 10 MG TABS Take 1 tablet (10 mg total) by mouth every 8 (eight) hours as needed. 30 tablet 0  . potassium chloride (K-DUR) 10 MEQ tablet Take 10 mEq by mouth 2 (two) times daily.    . predniSONE (DELTASONE) 10 MG tablet Take 3 tablets (30 mg total) by mouth 2 (two) times daily with a meal. (Patient taking differently: Take 30 mg by mouth 2 (two) times daily with a meal. ) 50 tablet 0  . XARELTO 20 MG TABS tablet Take 1 tablet by mouth daily.     No current facility-administered medications for this visit.    Facility-Administered Medications Ordered in Other Visits  Medication Dose Route Frequency Provider Last Rate Last Dose  . heparin lock flush 100 unit/mL  500 Units Intravenous Once Corcoran, [name], MD      . heparin lock flush 100 unit/mL  500 Units Intravenous Once Corcoran, [name], MD      . heparin lock flush 100 unit/mL  500 Units Intravenous Once Corcoran, [name], MD      . sodium chloride flush (NS) 0.9 % injection 10 mL  10 mL Intravenous PRN Nolon Stalls C, MD   10 mL at 10/21/17 1008    Review of Systems  Constitutional: Negative for diaphoresis, fever, malaise/fatigue and weight loss (up 4 pounds).  HENT: Negative.   Eyes: Negative.   Respiratory: Negative for cough, hemoptysis, sputum production and shortness of breath.   Cardiovascular: Positive for leg swelling (improved). Negative for chest pain, palpitations, orthopnea and PND.       TACHYcardic  Gastrointestinal: Negative for abdominal pain, blood in stool, constipation, diarrhea, melena, nausea and vomiting.  Genitourinary: Negative for dysuria, frequency, hematuria and  urgency.  Musculoskeletal: Negative for back pain, falls, joint pain and myalgias.       Pain to plantar surface of RIGHT foot s/p previous puncture wound.  Skin: Negative for itching and rash.       (+) pallor.  Open lesion to posterior aspect of LLE.  Lesions to LEFT inner thigh healing/fading.  Neurological: Negative for dizziness, tremors, weakness and headaches.  Endo/Heme/Allergies: Does not bruise/bleed easily.  Psychiatric/Behavioral: Negative for depression, memory loss and suicidal ideas. The patient has insomnia (steroid induced). The patient is not nervous/anxious.   All other systems reviewed and are negative.  Performance status (ECOG): 1 - 2  Vital Signs BP (!) 155/89 (BP Location: Left Arm, Patient Position: Sitting)   Pulse 94   Temp (!) 97.2 F (36.2 C) (Tympanic)   Resp 18   Wt 267 lb 3.2 oz (121.2  kg)   BMI 34.31 kg/m   Physical Exam  Constitutional: He is oriented to person, place, and time and well-developed, well-nourished, and in no distress.  HENT:  Head: Normocephalic and atraumatic. Hair is abnormal (chemotherapy induced alopecia).  Mouth/Throat: Abnormal dentition (poor dentition).  Eyes: Pupils are equal, round, and reactive to light. EOM are normal. No scleral icterus.  Brown eyes  Neck: Normal range of motion. Neck supple. No tracheal deviation present. No thyromegaly present.  Cardiovascular: Regular rhythm and normal heart sounds. Exam reveals no gallop and no friction rub.  No murmur heard. Pulmonary/Chest: Effort normal and breath sounds normal. No respiratory distress. He has no wheezes. He has no rales.  Abdominal: Soft. Bowel sounds are normal. He exhibits no distension. There is no tenderness.  Musculoskeletal: Normal range of motion. He exhibits no edema or tenderness.  No tenderness to dorsal aspect of RIGHT foot s/p distant puncture wound. No evidence of infection.   Lymphadenopathy:    He has no cervical adenopathy.    He has no  axillary adenopathy.       Right: No inguinal and no supraclavicular adenopathy present.       Left: No inguinal and no supraclavicular adenopathy present.  Neurological: He is alert and oriented to person, place, and time.  Skin: Skin is warm and dry. No rash noted. No erythema.     Psychiatric: Mood, affect and judgment normal.  Nursing note and vitals reviewed.   Medical photography: Lesion to posterior aspect of LEFT lower extremity                                   12/20/2017                                                            12/17/2017                    Imaging studies: 11/28/2015:  Chest, abdomen, and pelvic CT revealed no mediastinal adenopathy or suspicious pulmonary nodules.   There were 2 prominent left periaortic retroperitoneal lymph nodes (1.7 cm and 0.9 cm) concerning for testicular nodal metastasis.  There were no abnormal lymph nodes above the renal veins.  There were postsurgical change in the left hemiscrotum and left inguinal canal.  There was a 3.3 cm soft tissue abnormality anterior to the left iliopsoas muscle which could represent a metastatic lymph node (N2 lesion).  12/17/2015:  Head MRI revealed no evidence of metastatic disease.   03/30/2016:  Chest, abdomen, and pelvic CT revealed interval resolution of left abdominal/pelvic lymphadenopathy.  There was no residual metastatic disease. 09/30/2016:  Chest, abdomen, and pelvic CT revealed no evidence of recurrent or metastatic disease.  09/10/2017:  Bilateral lower extremity duplex revealed evidence of obstruction in the left CFV, SFJ, and proximal femoral vein.  09/10/2017:  CT pelvic venogram revealed multiple retroperitoneal masses including large mass within the left iliacus concerning for metastatic disease in the setting of prior testicular cancer.  Mass in the left iliacus caused significant narrowing of the proximal left external iliac vein. There was filling defect within the distal left external  iliac vein extending into the common femoral and proximal superficial femoral veins consistent with thrombus  09/11/2017:  Chest, abdomen, and pelvic CT revealed enlarged and morphologically abnormal 4.3 x 4.9 cm left pelvic sidewall and retroperitoneal lymph nodes (measuring up to 2.9 cm) concerning for metastatic disease given history of testicular cancer. Biopsy was suggested for confirmation. Prominent left inguinal lymph nodes could be metastatic or reactive in nature.  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  There was anasarca/swelling of the left lower extremity.  There was no evidence of metastatic disease in the chest. 09/13/2017:  Head MRI revealed no evidence of metastatic disease. 11/17/2017:  Right foot MRI revealed artifact in the plantar soft tissues over the first metatarsal head likely representing metallic foreign bodies as seen on previous radiographs.  There was an area of increased T2 signal intensity in the plantar aspect of the first metatarsal head with focal enhancement may indicate an area of focal osteomyelitis.  There was no soft tissue abscess. 12/07/2017:  Abdomen and pelvic CTrevealed no new or progressive metastatic disease.There was no acute vascular abnormality. IVC filter and left iliac venous stent graft werein place. There was persistent postsurgical fat stranding throughout the leftinguinal region without fluid collection 12/08/2017:  Bilateral lower extremity duplex revealed new nonocclusive thrombus in the left common femoralveinalong the anterior wall measuring 3-5 mm in thickness. This wasnot flow limiting but is new since the prior study on 11/05/2017 and consistent with acute/subacute thrombus (old per Dr. Delana Meyer). There was no evidence of right lower extremity deep vein thrombosis   Admissions: He was admitted to [facility] from 10/08/2017 - 10/16/2017 with left lower extremity  cellulitis.  He was treated with Cefepime and vancomycin.  Imaging revealed significant improvement in adenopathy with decreased pelvic sidewall disease and decreased bulk at aortic bifurcation.  He was felt to have venous congestion syndrome with skin lesions due to congestion.   He was admitted to [facility] from 10/21/2017 - 10/26/2017 for inpatient chemotherapy (cycle #2 TIP).    He was admitted to [facility] from 11/05/2017 - 11/09/2017 for cellulitis.  He was developing similar lower extremity skin lesions following his first cycle of chemotherapy.  Skin biopsy revealed diffuse neutrophilic dermatitis. Cultures were negative.  Xarelto was held secondary to thrombocytopenia (nadir platelet count 39,000).  He was treated with heparin.  Lower extremity duplex revealed no clot.  He was treated with Cefepime and vancomycin.  He was discharged on Augmentin.  He received oral acyclovir for herpetic labialis.  He was admitted to [facility] from 11/22/2017 - 11/27/2017 for inpatient chemotherapy (cycle #3 TIP).    He was admitted to [facility] from 12/07/2017 - 12/11/2017 with fever and lower extremity edema.  He developed recurrent leg lesions. 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  He was diagnosed with Sweet's syndrome (felt due to [facility]) and began Solumedrol 80 mg IV q day on 12/09/2017.  He fevers quickly resolved.  He received 1 unit of PRBCs.  Platelets became > 50,000 on 12/11/2017 and Xarelto was restarted.   Infusion on 12/20/2017  Component Date Value Ref Range Status  . LDH 12/20/2017 203* 98 - 192 U/L Final   Performed at [facility], [address] [place], [place] [address]  . Magnesium 12/20/2017 2.1  1.7 - 2.4 mg/dL Final   Performed at [facility], [address] [place], [place] [address]  . Sodium 12/20/2017 137  135 - 145 mmol/L Final  . Potassium 12/20/2017 4.1  3.5 - 5.1 mmol/L Final  . Chloride 12/20/2017 101  98 - 111 mmol/L Final  . CO2 12/20/2017 29   22 - 32 mmol/L Final  . Glucose, Bld 12/20/2017 127* 70 - 99 mg/dL Final  . BUN 12/20/2017 22* 6 - 20 mg/dL Final  . Creatinine, Ser 12/20/2017 0.92  0.61 - 1.24 mg/dL Final  . Calcium 12/20/2017 9.3  8.9 - 10.3 mg/dL Final  . Total Protein 12/20/2017 6.7  6.5 - 8.1 g/dL Final  . Albumin 12/20/2017 4.1  3.5 - 5.0 g/dL Final  . AST 12/20/2017 15  15 - 41 U/L Final  . ALT 12/20/2017 17  0 - 44 U/L Final  . Alkaline Phosphatase 12/20/2017 49  38 - 126 U/L Final  . Total Bilirubin 12/20/2017 0.7  0.3 - 1.2 mg/dL Final  . GFR calc non Af Amer 12/20/2017 >60  >60 mL/min Final  . GFR calc Af Amer 12/20/2017 >60  >60 mL/min Final   Comment: (NOTE) The eGFR has been calculated using the CKD EPI equation. This calculation has not been validated in all clinical situations. eGFR's persistently <60 mL/min signify possible Chronic Kidney Disease.   Georgiann Hahn gap 12/20/2017 7  5 - 15 Final   Performed at [facility], [address] [place], [place] [address]  . WBC 12/20/2017 7.1  3.8 - 10.6 K/uL Final  . RBC 12/20/2017 2.77* 4.40 - 5.90 MIL/uL Final  . Hemoglobin 12/20/2017 9.3* 13.0 - 18.0 g/dL Final  . HCT 12/20/2017 27.2* 40.0 - 52.0 % Final  . MCV 12/20/2017 98.1  80.0 - 100.0 fL Final  . MCH 12/20/2017 33.8  26.0 - 34.0 pg Final  . MCHC 12/20/2017 34.4  32.0 - 36.0 g/dL Final  . RDW 12/20/2017 20.4* 11.5 - 14.5 % Final  . Platelets 12/20/2017 363  150 - 440 K/uL Final  . Neutrophils Relative % 12/20/2017 82  % Final  . Neutro Abs 12/20/2017 5.9  1.4 - 6.5 K/uL Final  . Lymphocytes Relative 12/20/2017 7  % Final  . Lymphs Abs 12/20/2017 0.5* 1.0 - 3.6 K/uL Final  . Monocytes Relative 12/20/2017 11  % Final  . Monocytes Absolute 12/20/2017 0.8  0.2 - 1.0 K/uL Final  . Eosinophils Relative 12/20/2017 0  % Final  . Eosinophils Absolute 12/20/2017 0.0  0 - 0.7 K/uL Final  . Basophils Relative 12/20/2017 0  % Final  . Basophils Absolute 12/20/2017 0.0  0 - 0.1 K/uL Final   Performed  at [facility], [address], [place], [place] [address]    Assessment:  ROXANNE ORNER is a 27 y.o. male with recurrent testicular cancer.  He presented with a left lower extremity DVT on 09/10/2017.  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  Clinical/pathologic [name] is T1 N1-2 S0 M0.  He received 3 cycles of  BEP (12/30/2015 - 02/24/2016).  He received OnPro Neulasta with cycle #2 and #3.  He declined evaluation for retroperitoneal lymph node dissection (RPLND).  He declined sperm banking.  Pre surgery labs on 11/20/2015 revealed an AFP of 411.9, beta-HCG 2,669, and LDH 522 (121-224).    Tumor markers have been followed:   AFP was 411.9 (0-8.3) on 11/20/2015, 11.2 on 12/19/2015, 6.3 on 12/23/2015, 1.3 on 01/27/2016, 1.8 on 02/17/2016, 1.9 on 03/30/2016, 1.4 on 05/27/2016, 1.0 on 07/29/2016, 1.4 on 10/14/2016,  3.3 on 12/24/2016, 1 on 09/10/2017, 1 on 09/20/2017,  2.6 on 10/21/2017, and 2.1 on 11/16/2017.    Beta-HCG was 2,669 (0-3) on 11/20/2015, 2 on 12/19/2015, 1 on 12/23/2015, < 1 on 01/27/2016, < 1 on 02/17/2016, < 1 on 03/30/2016, < 1 on 05/27/2016,  < 1 on 07/29/2016, < 1 on 10/14/2016, and 3 on 12/24/2016, < 5 on 09/11/2017,  < 5 on 09/20/2017, < 1 on 10/21/2017, and < 1 on 11/16/2017.  LDH was 522 (98-192) on 11/20/2015, 179 on 12/19/2015, 160 on 12/23/2015, 202 on 01/27/2016, 156 on 02/10/2016, 161 on 02/17/2016, 172 on 03/30/2016, 178 on 05/27/2016, 150 on 07/29/2016, 152 on 10/14/2016, 145 on 12/24/2016, 1557 on 09/11/2017, 916 on 09/20/2017, 144 on 10/21/2017, and 158 on 11/16/2017.  Chest, abdomen, and pelvic CT on 09/11/2017 revealed enlarged and morphologically abnormal 4.3 x 4.9 cm left pelvic sidewall and retroperitoneal lymph nodes (measuring up  to 2.9 cm).  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  There was anasarca/swelling of the left lower extremity.  There was no evidence of metastatic disease in the chest.  Head MRI on 09/13/2017 revealed no evidence of metastatic disease.   He has a left lower extremity DVT.  Bilateral lower extremity duplex on 09/10/2017 revealed evidence of obstruction in the left CFV, SFJ, and proximal femoral vein.  He is on Xarelto.  He is currently s/p cycle #3 TIP chemotherapy (09/21/2017 - 11/27/2017) with Margarette Canada support. Cycle #1 was [facility] at [facility]. Cycles #2 and 3 were administered at [facility].  Both cycles 1 and 2 were complicated by lower extremity skin lesions on recovery of counts.  Audiogram on 10/27/2017 revealed hearing within normal limits.  He underwent IVC filterplacement, percutaneous angioplastyand stent placementof the left common iliac vein and left external iliac vein on 10/13/2017.  He was discharged on Flagyl, doxycycline, and Levaquin.  Hypercoagulable work-up on 10/12/2017 negative for the following: Factor V Leiden, prothrombin gene mutation, lupus anticoagulant, anticardiolipin antibodies, beta-2 glycoprotein, protein C activity/antigen, protein S activity/antigen. ATIII was 54% (75-120) on heparin.  He has a history of right lower molar abscess s/p extraction on 11/19/2017.  He was treated with amoxicillin.  He stepped on a nail with his right foot.  He was treated with antibiotics for a puncture wound.  He has been followed by podiatry.  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  He is on prednisone 30 mg daily.  Symptomatically, patient had some nausea and vomiting this morning.  Right foot tenderness and swelling has improved.  He has more pain in the wound to his left posterior leg.  Wound care being performed twice daily.  No fevers.  Eating well; weight up 4 pounds.  Exam reveals some  improvement to left lower extremity wound.  Wound appears to be more dry today.  WBC 7100 (ANC 5900).  Hemoglobin 9.3, hematocrit 27.2, and platelets 363,000.  Beta HCG and AFP normal.  Plan: 1. Labs today:  CBC with diff, BMP, Mg. 2. Recurrent testicular cancer:  Clinically stable. Feels generally well.   Labs reviewed. Blood counts stable and adequate enough for treatment, however given state of LLE wound, will postpone cycle #4 TIP.  3. Chemotherapy induced pancytopenia:  WBC 7100 (ANC 5900).  Hemoglobin 9.3, hematocrit 27.2, and platelets 363,000.  Daily CBC with differential while inpatient for chemotherapy.   Continue to await preauth for Neupogen.   Need to determine status today if possible. May have to  consider using biosimilar (Nevestim).   Avoid pegylated preprations due to previous neutrophilic dermatosis associated with Udencya use.   Discuss concerns for post-treatment neutropenia. Despite Udencya in the past, Henry has dropped to 0.   Anticipate daily Neupogen use x 10-14 days following completion of cycle #4 TIP chemotherapy to support counts. 4. Sweet's syndrome:  Medication induced (Udencya).  Continues on daily steroids. Currently on prednisone 30 mg daily.   Decrease prednisone to 20 mg a day beginning tomorrow.  Lesion to posterior LLE open and draining (see medical photo above).   Question infection and need for further antibiotic coverage.   Refer to ID clinic Delaine Lame, MD) for follow up.  Spoke with Dr. Delaine Lame.  Refer to [facility] dermatology Marolyn Hammock, MD) for evaluation.  Spoke with Dr. Marolyn Hammock.  Care coordinated. 5. LEFT lower extremity DVT:  Remains on chronic anticoagulation (rivaroxaban).  No increased bruising or bleeding.  6. RIGHT foot tenderness (nail puncture)  Dorsal aspect of RIGHT foot remains tender.  Patient followed by Dr. Cleda Mccreedy (podiatry).   No obvious signs of infections. Previous concerns for possible osteomyelitis with  onset.  7. Electrolyte wasting:  Potassium 4.1 and magnesium 2.1.  Continue daily magnesium and potassium supplementation in the outpatient setting. Labs routinely monitored.  8. Pain and symptom management Currently doing well on oxycodone 10 mg TID PRN.  Has anxiety. Rx'd clonazepam 0.5 mg BID while inpatient. Intervention effective.  Continue as previously prescribed. Patient has antiemetics and pain medications at home to use on a PRN basis. Patient advising that the  prescribed interventions are adequate at this point. Continue all medications as previously prescribed.   Continue omeprazole for reflux. 9. RTC on 12/27/2017 for MD assessment, labs (CBC with diff, CMP, Mg), and admission for cycle #4 TIP   Honor Loh, NP  12/20/2017, 11:23 AM   I saw and evaluated the patient, participating in the key portions of the service and reviewing pertinent diagnostic studies and records.  I reviewed the nurse practitioner's note and agree with the findings and the plan.  The assessment and plan were discussed with the patient.  Several questions were asked by the patient and answered.   Nolon Stalls, MD 12/20/2017,11:23 AM

## 2017-12-20 ENCOUNTER — Other Ambulatory Visit: Payer: Self-pay

## 2017-12-20 ENCOUNTER — Inpatient Hospital Stay (HOSPITAL_BASED_OUTPATIENT_CLINIC_OR_DEPARTMENT_OTHER): Payer: BLUE CROSS/BLUE SHIELD | Admitting: Hematology and Oncology

## 2017-12-20 ENCOUNTER — Inpatient Hospital Stay: Payer: BLUE CROSS/BLUE SHIELD

## 2017-12-20 VITALS — BP 155/89 | HR 94 | Temp 97.2°F | Resp 18 | Wt 267.2 lb

## 2017-12-20 DIAGNOSIS — R1032 Left lower quadrant pain: Secondary | ICD-10-CM | POA: Diagnosis not present

## 2017-12-20 DIAGNOSIS — J45909 Unspecified asthma, uncomplicated: Secondary | ICD-10-CM

## 2017-12-20 DIAGNOSIS — Z86718 Personal history of other venous thrombosis and embolism: Secondary | ICD-10-CM

## 2017-12-20 DIAGNOSIS — T451X5A Adverse effect of antineoplastic and immunosuppressive drugs, initial encounter: Secondary | ICD-10-CM

## 2017-12-20 DIAGNOSIS — Z7952 Long term (current) use of systemic steroids: Secondary | ICD-10-CM | POA: Diagnosis not present

## 2017-12-20 DIAGNOSIS — D6481 Anemia due to antineoplastic chemotherapy: Secondary | ICD-10-CM

## 2017-12-20 DIAGNOSIS — Z79899 Other long term (current) drug therapy: Secondary | ICD-10-CM | POA: Diagnosis not present

## 2017-12-20 DIAGNOSIS — L982 Febrile neutrophilic dermatosis [Sweet]: Secondary | ICD-10-CM

## 2017-12-20 DIAGNOSIS — Z7689 Persons encountering health services in other specified circumstances: Secondary | ICD-10-CM | POA: Diagnosis not present

## 2017-12-20 DIAGNOSIS — R112 Nausea with vomiting, unspecified: Secondary | ICD-10-CM

## 2017-12-20 DIAGNOSIS — C6212 Malignant neoplasm of descended left testis: Secondary | ICD-10-CM | POA: Diagnosis not present

## 2017-12-20 DIAGNOSIS — C6210 Malignant neoplasm of unspecified descended testis: Secondary | ICD-10-CM

## 2017-12-20 DIAGNOSIS — Z801 Family history of malignant neoplasm of trachea, bronchus and lung: Secondary | ICD-10-CM

## 2017-12-20 DIAGNOSIS — Z800 Family history of malignant neoplasm of digestive organs: Secondary | ICD-10-CM

## 2017-12-20 DIAGNOSIS — I82412 Acute embolism and thrombosis of left femoral vein: Secondary | ICD-10-CM

## 2017-12-20 DIAGNOSIS — E639 Nutritional deficiency, unspecified: Secondary | ICD-10-CM

## 2017-12-20 DIAGNOSIS — Z7901 Long term (current) use of anticoagulants: Secondary | ICD-10-CM

## 2017-12-20 DIAGNOSIS — F17210 Nicotine dependence, cigarettes, uncomplicated: Secondary | ICD-10-CM | POA: Diagnosis not present

## 2017-12-20 DIAGNOSIS — R609 Edema, unspecified: Secondary | ICD-10-CM

## 2017-12-20 DIAGNOSIS — Z803 Family history of malignant neoplasm of breast: Secondary | ICD-10-CM

## 2017-12-20 DIAGNOSIS — K219 Gastro-esophageal reflux disease without esophagitis: Secondary | ICD-10-CM | POA: Diagnosis not present

## 2017-12-20 DIAGNOSIS — G620 Drug-induced polyneuropathy: Secondary | ICD-10-CM | POA: Diagnosis not present

## 2017-12-20 DIAGNOSIS — D61810 Antineoplastic chemotherapy induced pancytopenia: Secondary | ICD-10-CM

## 2017-12-20 DIAGNOSIS — E876 Hypokalemia: Secondary | ICD-10-CM

## 2017-12-20 LAB — COMPREHENSIVE METABOLIC PANEL
ALBUMIN: 4.1 g/dL (ref 3.5–5.0)
ALT: 17 U/L (ref 0–44)
ANION GAP: 7 (ref 5–15)
AST: 15 U/L (ref 15–41)
Alkaline Phosphatase: 49 U/L (ref 38–126)
BUN: 22 mg/dL — ABNORMAL HIGH (ref 6–20)
CALCIUM: 9.3 mg/dL (ref 8.9–10.3)
CHLORIDE: 101 mmol/L (ref 98–111)
CO2: 29 mmol/L (ref 22–32)
Creatinine, Ser: 0.92 mg/dL (ref 0.61–1.24)
GFR calc non Af Amer: >60 mL/min (ref >60)
Glucose, Bld: 127 mg/dL — ABNORMAL HIGH (ref 70–99)
POTASSIUM: 4.1 mmol/L (ref 3.5–5.1)
SODIUM: 137 mmol/L (ref 135–145)
Total Bilirubin: 0.7 mg/dL (ref 0.3–1.2)
Total Protein: 6.7 g/dL (ref 6.5–8.1)

## 2017-12-20 LAB — CBC WITH DIFFERENTIAL/PLATELET
Basophils Absolute: 0 10*3/uL (ref 0–0.1)
Basophils Relative: 0 %
Eosinophils Absolute: 0 10*3/uL (ref 0–0.7)
Eosinophils Relative: 0 %
HEMATOCRIT: 27.2 % — AB (ref 40.0–52.0)
HEMOGLOBIN: 9.3 g/dL — AB (ref 13.0–18.0)
LYMPHS ABS: 0.5 10*3/uL — AB (ref 1.0–3.6)
Lymphocytes Relative: 7 %
MCH: 33.8 pg (ref 26.0–34.0)
MCHC: 34.4 g/dL (ref 32.0–36.0)
MCV: 98.1 fL (ref 80.0–100.0)
Monocytes Absolute: 0.8 10*3/uL (ref 0.2–1.0)
Monocytes Relative: 11 %
NEUTROS ABS: 5.9 10*3/uL (ref 1.4–6.5)
NEUTROS PCT: 82 %
Platelets: 363 10*3/uL (ref 150–440)
RBC: 2.77 MIL/uL — ABNORMAL LOW (ref 4.40–5.90)
RDW: 20.4 % — ABNORMAL HIGH (ref 11.5–14.5)
WBC: 7.1 10*3/uL (ref 3.8–10.6)

## 2017-12-20 LAB — MAGNESIUM: MAGNESIUM: 2.1 mg/dL (ref 1.7–2.4)

## 2017-12-20 LAB — LACTATE DEHYDROGENASE: LDH: 203 U/L — ABNORMAL HIGH (ref 98–192)

## 2017-12-20 MED ORDER — SODIUM CHLORIDE 0.9% FLUSH
10.0000 mL | Freq: Once | INTRAVENOUS | Status: AC
  Administered 2017-12-20: 10 mL via INTRAVENOUS
  Filled 2017-12-20: qty 10

## 2017-12-20 MED ORDER — HEPARIN SOD (PORK) LOCK FLUSH 100 UNIT/ML IV SOLN
500.0000 [IU] | Freq: Once | INTRAVENOUS | Status: AC
  Administered 2017-12-20: 500 [IU] via INTRAVENOUS

## 2017-12-20 NOTE — Progress Notes (Signed)
Here for follow up. Stated wound on back of L lower leg is burning ,same as 3 days ago per pt -drsg change bid.

## 2017-12-21 ENCOUNTER — Other Ambulatory Visit: Payer: Self-pay | Admitting: Hematology and Oncology

## 2017-12-21 ENCOUNTER — Encounter: Payer: Self-pay | Admitting: Urgent Care

## 2017-12-21 DIAGNOSIS — L03116 Cellulitis of left lower limb: Secondary | ICD-10-CM | POA: Diagnosis not present

## 2017-12-21 LAB — BETA HCG QUANT (REF LAB): hCG Quant: <1 m[IU]/mL (ref 0–3)

## 2017-12-21 LAB — AFP TUMOR MARKER: AFP, Serum, Tumor Marker: 2.1 ng/mL (ref 0.0–8.3)

## 2017-12-21 MED ORDER — OXYCODONE HCL 10 MG PO TABS: 10.0000 mg | ORAL_TABLET | Freq: Three times a day (TID) | ORAL | 0 refills | Status: DC | PRN

## 2017-12-21 NOTE — Progress Notes (Signed)
Narcotic refill request:  [place] CSRS database reviewed prior to consideration of refills:  NARX scores --    Narcotic: 521  Sedative: 401  30 day average MME/day:30.50  30 day average LME/day: 0.67  ORS score (range 0-999): 530  Providers outside of this practice prescribing narcotics/sedatives in the last 90 days: YES    Given a current oncology diagnosis, this patient has the potential to experience significant cancer related pain. He is on active treatment for recurrent testicular cancer. 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 Benefits versus risks associated with continued therapy considered. Continuation of opioid therapy has been discussed by attending oncology Mike Gip, MD), who agrees that continued therapy is appropriate for this patient. Will continue pain management with opioids as previously prescribed. Patient educated that medications should not be bitten, chewed, or crushed. Additionally, safety precautions reviewed. Patient verbalized understanding that medications should not be sold or shared, taken with alcohol, or used while driving. He has been made aware of the side effects of using this medication. Patient understands that this medication can cause CNS depression, increase his risk of falls, and even lead to overdose that may result in death, if used outside of the parameters that he and I discussed. With all of this in mind, he accepts the risks and responsibilities associated with therapy and elects to continue to use the prescribed interventions.   Patient asking for refill on Klonopin as well. He was given #60 on 12/11/2017 by Dr. Bettey Costa ([place] - hospitalist). Patient sent MyChart message in today indicating that his medications were mistakenly discarded when his fiance was cleaning out medications that he was no longer taking. The decision was made NOT TO refill this prescription at this time.     Refill prescription sent in for: 1. Oxycodone 10 mg q8 PRN (Disp #30)  Honor Loh, MSN, APRN, FNP-C, CEN Oncology/Hematology Nurse Practitioner  [facility] 12/21/17 9:21 PM

## 2017-12-22 DIAGNOSIS — L88 Pyoderma gangrenosum: Secondary | ICD-10-CM | POA: Diagnosis not present

## 2017-12-22 DIAGNOSIS — L982 Febrile neutrophilic dermatosis [Sweet]: Secondary | ICD-10-CM | POA: Diagnosis not present

## 2017-12-23 ENCOUNTER — Telehealth: Payer: Self-pay | Admitting: *Deleted

## 2017-12-24 DIAGNOSIS — K219 Gastro-esophageal reflux disease without esophagitis: Secondary | ICD-10-CM | POA: Diagnosis not present

## 2017-12-24 DIAGNOSIS — C6290 Malignant neoplasm of unspecified testis, unspecified whether descended or undescended: Secondary | ICD-10-CM | POA: Diagnosis not present

## 2017-12-24 DIAGNOSIS — L982 Febrile neutrophilic dermatosis [Sweet]: Secondary | ICD-10-CM | POA: Diagnosis not present

## 2017-12-24 DIAGNOSIS — Z452 Encounter for adjustment and management of vascular access device: Secondary | ICD-10-CM | POA: Diagnosis not present

## 2017-12-24 DIAGNOSIS — L03116 Cellulitis of left lower limb: Secondary | ICD-10-CM | POA: Diagnosis not present

## 2017-12-24 DIAGNOSIS — F321 Major depressive disorder, single episode, moderate: Secondary | ICD-10-CM | POA: Diagnosis not present

## 2017-12-24 DIAGNOSIS — D6481 Anemia due to antineoplastic chemotherapy: Secondary | ICD-10-CM | POA: Diagnosis not present

## 2017-12-24 DIAGNOSIS — L988 Other specified disorders of the skin and subcutaneous tissue: Secondary | ICD-10-CM | POA: Diagnosis not present

## 2017-12-24 DIAGNOSIS — J45909 Unspecified asthma, uncomplicated: Secondary | ICD-10-CM | POA: Diagnosis not present

## 2017-12-24 DIAGNOSIS — Z792 Long term (current) use of antibiotics: Secondary | ICD-10-CM | POA: Diagnosis not present

## 2017-12-24 DIAGNOSIS — Z7901 Long term (current) use of anticoagulants: Secondary | ICD-10-CM | POA: Diagnosis not present

## 2017-12-24 DIAGNOSIS — F17210 Nicotine dependence, cigarettes, uncomplicated: Secondary | ICD-10-CM | POA: Diagnosis not present

## 2017-12-24 DIAGNOSIS — I824Y2 Acute embolism and thrombosis of unspecified deep veins of left proximal lower extremity: Secondary | ICD-10-CM | POA: Diagnosis not present

## 2017-12-25 ENCOUNTER — Encounter: Payer: Self-pay | Admitting: Hematology and Oncology

## 2017-12-27 ENCOUNTER — Inpatient Hospital Stay: Payer: BLUE CROSS/BLUE SHIELD

## 2017-12-27 ENCOUNTER — Other Ambulatory Visit: Payer: Self-pay | Admitting: Hematology and Oncology

## 2017-12-27 ENCOUNTER — Inpatient Hospital Stay
Admission: AD | Admit: 2017-12-27 | Discharge: 2018-01-01 | DRG: 848 | Disposition: A | Discharge status: Home Health | Payer: BLUE CROSS/BLUE SHIELD | Source: Ambulatory Visit | Attending: Internal Medicine | Admitting: Internal Medicine

## 2017-12-27 ENCOUNTER — Encounter: Payer: Self-pay | Admitting: Hematology and Oncology

## 2017-12-27 ENCOUNTER — Other Ambulatory Visit: Payer: Self-pay

## 2017-12-27 ENCOUNTER — Inpatient Hospital Stay (HOSPITAL_BASED_OUTPATIENT_CLINIC_OR_DEPARTMENT_OTHER): Payer: BLUE CROSS/BLUE SHIELD | Admitting: Hematology and Oncology

## 2017-12-27 ENCOUNTER — Telehealth: Payer: Self-pay | Admitting: *Deleted

## 2017-12-27 VITALS — BP 124/82 | HR 128 | Temp 97.7°F | Resp 18 | Wt 251.0 lb

## 2017-12-27 DIAGNOSIS — I82402 Acute embolism and thrombosis of unspecified deep veins of left lower extremity: Secondary | ICD-10-CM | POA: Diagnosis not present

## 2017-12-27 DIAGNOSIS — T451X5A Adverse effect of antineoplastic and immunosuppressive drugs, initial encounter: Secondary | ICD-10-CM | POA: Diagnosis not present

## 2017-12-27 DIAGNOSIS — Z86718 Personal history of other venous thrombosis and embolism: Secondary | ICD-10-CM | POA: Diagnosis not present

## 2017-12-27 DIAGNOSIS — G620 Drug-induced polyneuropathy: Secondary | ICD-10-CM | POA: Diagnosis not present

## 2017-12-27 DIAGNOSIS — Z833 Family history of diabetes mellitus: Secondary | ICD-10-CM | POA: Diagnosis not present

## 2017-12-27 DIAGNOSIS — J45909 Unspecified asthma, uncomplicated: Secondary | ICD-10-CM

## 2017-12-27 DIAGNOSIS — G8929 Other chronic pain: Secondary | ICD-10-CM | POA: Diagnosis not present

## 2017-12-27 DIAGNOSIS — K219 Gastro-esophageal reflux disease without esophagitis: Secondary | ICD-10-CM

## 2017-12-27 DIAGNOSIS — Z808 Family history of malignant neoplasm of other organs or systems: Secondary | ICD-10-CM | POA: Diagnosis not present

## 2017-12-27 DIAGNOSIS — Z9221 Personal history of antineoplastic chemotherapy: Secondary | ICD-10-CM | POA: Diagnosis not present

## 2017-12-27 DIAGNOSIS — Z9079 Acquired absence of other genital organ(s): Secondary | ICD-10-CM | POA: Diagnosis not present

## 2017-12-27 DIAGNOSIS — Z7689 Persons encountering health services in other specified circumstances: Secondary | ICD-10-CM

## 2017-12-27 DIAGNOSIS — L982 Febrile neutrophilic dermatosis [Sweet]: Secondary | ICD-10-CM | POA: Diagnosis not present

## 2017-12-27 DIAGNOSIS — Z79891 Long term (current) use of opiate analgesic: Secondary | ICD-10-CM

## 2017-12-27 DIAGNOSIS — F17210 Nicotine dependence, cigarettes, uncomplicated: Secondary | ICD-10-CM | POA: Diagnosis not present

## 2017-12-27 DIAGNOSIS — M795 Residual foreign body in soft tissue: Secondary | ICD-10-CM | POA: Diagnosis not present

## 2017-12-27 DIAGNOSIS — Z7901 Long term (current) use of anticoagulants: Secondary | ICD-10-CM

## 2017-12-27 DIAGNOSIS — C6210 Malignant neoplasm of unspecified descended testis: Secondary | ICD-10-CM

## 2017-12-27 DIAGNOSIS — Z79899 Other long term (current) drug therapy: Secondary | ICD-10-CM

## 2017-12-27 DIAGNOSIS — E876 Hypokalemia: Secondary | ICD-10-CM | POA: Diagnosis not present

## 2017-12-27 DIAGNOSIS — Z800 Family history of malignant neoplasm of digestive organs: Secondary | ICD-10-CM

## 2017-12-27 DIAGNOSIS — C6290 Malignant neoplasm of unspecified testis, unspecified whether descended or undescended: Secondary | ICD-10-CM | POA: Diagnosis present

## 2017-12-27 DIAGNOSIS — T50995D Adverse effect of other drugs, medicaments and biological substances, subsequent encounter: Secondary | ICD-10-CM

## 2017-12-27 DIAGNOSIS — Z803 Family history of malignant neoplasm of breast: Secondary | ICD-10-CM | POA: Diagnosis not present

## 2017-12-27 DIAGNOSIS — Z823 Family history of stroke: Secondary | ICD-10-CM | POA: Diagnosis not present

## 2017-12-27 DIAGNOSIS — R609 Edema, unspecified: Secondary | ICD-10-CM

## 2017-12-27 DIAGNOSIS — Z7952 Long term (current) use of systemic steroids: Secondary | ICD-10-CM

## 2017-12-27 DIAGNOSIS — R1032 Left lower quadrant pain: Secondary | ICD-10-CM

## 2017-12-27 DIAGNOSIS — I82412 Acute embolism and thrombosis of left femoral vein: Secondary | ICD-10-CM

## 2017-12-27 DIAGNOSIS — C6212 Malignant neoplasm of descended left testis: Secondary | ICD-10-CM

## 2017-12-27 DIAGNOSIS — R531 Weakness: Secondary | ICD-10-CM | POA: Diagnosis not present

## 2017-12-27 DIAGNOSIS — Z5111 Encounter for antineoplastic chemotherapy: Secondary | ICD-10-CM | POA: Diagnosis not present

## 2017-12-27 DIAGNOSIS — R112 Nausea with vomiting, unspecified: Secondary | ICD-10-CM | POA: Diagnosis not present

## 2017-12-27 DIAGNOSIS — D61810 Antineoplastic chemotherapy induced pancytopenia: Secondary | ICD-10-CM

## 2017-12-27 DIAGNOSIS — Z7189 Other specified counseling: Secondary | ICD-10-CM

## 2017-12-27 DIAGNOSIS — Z818 Family history of other mental and behavioral disorders: Secondary | ICD-10-CM

## 2017-12-27 DIAGNOSIS — L97921 Non-pressure chronic ulcer of unspecified part of left lower leg limited to breakdown of skin: Secondary | ICD-10-CM

## 2017-12-27 DIAGNOSIS — Z825 Family history of asthma and other chronic lower respiratory diseases: Secondary | ICD-10-CM | POA: Diagnosis not present

## 2017-12-27 DIAGNOSIS — I82409 Acute embolism and thrombosis of unspecified deep veins of unspecified lower extremity: Secondary | ICD-10-CM | POA: Diagnosis not present

## 2017-12-27 DIAGNOSIS — S91341D Puncture wound with foreign body, right foot, subsequent encounter: Secondary | ICD-10-CM

## 2017-12-27 DIAGNOSIS — E41 Nutritional marasmus: Secondary | ICD-10-CM | POA: Diagnosis not present

## 2017-12-27 DIAGNOSIS — Z801 Family history of malignant neoplasm of trachea, bronchus and lung: Secondary | ICD-10-CM

## 2017-12-27 DIAGNOSIS — R238 Other skin changes: Secondary | ICD-10-CM | POA: Diagnosis not present

## 2017-12-27 LAB — COMPREHENSIVE METABOLIC PANEL
ALT: 35 U/L (ref 0–44)
ANION GAP: 12 (ref 5–15)
AST: 26 U/L (ref 15–41)
Albumin: 5.2 g/dL — ABNORMAL HIGH (ref 3.5–5.0)
Alkaline Phosphatase: 55 U/L (ref 38–126)
BUN: 16 mg/dL (ref 6–20)
CHLORIDE: 103 mmol/L (ref 98–111)
CO2: 24 mmol/L (ref 22–32)
Calcium: 10.5 mg/dL — ABNORMAL HIGH (ref 8.9–10.3)
Creatinine, Ser: 0.97 mg/dL (ref 0.61–1.24)
GFR calc non Af Amer: >60 mL/min (ref >60)
Glucose, Bld: 167 mg/dL — ABNORMAL HIGH (ref 70–99)
Potassium: 4.3 mmol/L (ref 3.5–5.1)
SODIUM: 139 mmol/L (ref 135–145)
Total Bilirubin: 0.9 mg/dL (ref 0.3–1.2)
Total Protein: 8.1 g/dL (ref 6.5–8.1)

## 2017-12-27 LAB — CBC WITH DIFFERENTIAL/PLATELET
ABS IMMATURE GRANULOCYTES: 0.07 10*3/uL (ref 0.00–0.07)
Basophils Absolute: 0 10*3/uL (ref 0.0–0.1)
Basophils Relative: 0 %
Eosinophils Absolute: 0 10*3/uL (ref 0.0–0.5)
Eosinophils Relative: 0 %
HEMATOCRIT: 40.6 % (ref 39.0–52.0)
HEMOGLOBIN: 12.8 g/dL — AB (ref 13.0–17.0)
Immature Granulocytes: 1 %
LYMPHS ABS: 0.6 10*3/uL — AB (ref 0.7–4.0)
LYMPHS PCT: 6 %
MCH: 31.4 pg (ref 26.0–34.0)
MCHC: 31.5 g/dL (ref 30.0–36.0)
MCV: 99.5 fL (ref 80.0–100.0)
MONO ABS: 0.6 10*3/uL (ref 0.1–1.0)
Monocytes Relative: 5 %
NEUTROS ABS: 10 10*3/uL — AB (ref 1.7–7.7)
Neutrophils Relative %: 88 %
Platelets: 392 10*3/uL (ref 150–400)
RBC: 4.08 MIL/uL — AB (ref 4.22–5.81)
RDW: 18.6 % — ABNORMAL HIGH (ref 11.5–15.5)
WBC: 11.3 10*3/uL — ABNORMAL HIGH (ref 4.0–10.5)
nRBC: 0 % (ref 0.0–0.2)

## 2017-12-27 LAB — MAGNESIUM: Magnesium: 2.1 mg/dL (ref 1.7–2.4)

## 2017-12-27 LAB — LACTATE DEHYDROGENASE: LDH: 151 U/L (ref 98–192)

## 2017-12-27 MED ORDER — SODIUM CHLORIDE 0.9 % IV SOLN
Freq: Once | INTRAVENOUS | Status: AC
  Administered 2017-12-27: 20:00:00 8 mg via INTRAVENOUS
  Filled 2017-12-27: qty 4

## 2017-12-27 MED ORDER — SODIUM CHLORIDE 0.9% FLUSH: 3.0000 mL | INTRAVENOUS | Status: DC | PRN

## 2017-12-27 MED ORDER — DIPHENHYDRAMINE HCL 50 MG/ML IJ SOLN
25.0000 mg | Freq: Once | INTRAMUSCULAR | Status: DC | PRN
  Filled 2017-12-27: qty 0.5

## 2017-12-27 MED ORDER — DIPHENHYDRAMINE HCL 50 MG/ML IJ SOLN
50.0000 mg | Freq: Once | INTRAMUSCULAR | Status: AC
  Administered 2017-12-27: 20:00:00 50 mg via INTRAVENOUS
  Filled 2017-12-27: qty 1

## 2017-12-27 MED ORDER — SODIUM CHLORIDE 0.9 % IV SOLN
INTRAVENOUS | Status: DC
  Administered 2017-12-27: 20:00:00 via INTRAVENOUS
  Administered 2017-12-29: 18:00:00 10 mL via INTRAVENOUS
  Administered 2017-12-29 – 2017-12-30 (×2): 10 mL/h via INTRAVENOUS
  Administered 2017-12-30: 06:00:00 via INTRAVENOUS
  Administered 2017-12-30 – 2018-01-01 (×3): 10 mL/h via INTRAVENOUS

## 2017-12-27 MED ORDER — HEPARIN SOD (PORK) LOCK FLUSH 100 UNIT/ML IV SOLN: 500.0000 [IU] | Freq: Once | INTRAVENOUS | Status: DC | PRN

## 2017-12-27 MED ORDER — EPINEPHRINE PF 1 MG/10ML IJ SOSY
0.2500 mg | PREFILLED_SYRINGE | Freq: Once | INTRAMUSCULAR | Status: DC | PRN
  Filled 2017-12-27: qty 10

## 2017-12-27 MED ORDER — EPINEPHRINE PF 1 MG/ML IJ SOLN: 0.5000 mg | Freq: Once | INTRAMUSCULAR | Status: DC | PRN

## 2017-12-27 MED ORDER — MAGNESIUM OXIDE 400 (241.3 MG) MG PO TABS
400.0000 mg | ORAL_TABLET | Freq: Every day | ORAL | Status: DC
  Administered 2017-12-28 – 2017-12-29 (×2): 400 mg via ORAL
  Filled 2017-12-27 (×2): qty 1

## 2017-12-27 MED ORDER — GABAPENTIN 300 MG PO CAPS
300.0000 mg | ORAL_CAPSULE | Freq: Every day | ORAL | Status: DC
  Administered 2017-12-27 – 2017-12-31 (×5): 300 mg via ORAL
  Filled 2017-12-27 (×5): qty 1

## 2017-12-27 MED ORDER — FAMOTIDINE IN NACL 20-0.9 MG/50ML-% IV SOLN
20.0000 mg | Freq: Once | INTRAVENOUS | Status: AC
  Administered 2017-12-27: 20:00:00 20 mg via INTRAVENOUS
  Filled 2017-12-27: qty 50

## 2017-12-27 MED ORDER — ALTEPLASE 2 MG IJ SOLR
2.0000 mg | Freq: Once | INTRAMUSCULAR | Status: DC | PRN
  Filled 2017-12-27: qty 2

## 2017-12-27 MED ORDER — METHYLPREDNISOLONE SODIUM SUCC 125 MG IJ SOLR: 125.0000 mg | Freq: Once | INTRAMUSCULAR | Status: DC | PRN

## 2017-12-27 MED ORDER — ALBUTEROL SULFATE (2.5 MG/3ML) 0.083% IN NEBU: 2.5000 mg | INHALATION_SOLUTION | Freq: Once | RESPIRATORY_TRACT | Status: DC | PRN

## 2017-12-27 MED ORDER — FAMOTIDINE IN NACL 20-0.9 MG/50ML-% IV SOLN: 20.0000 mg | Freq: Once | INTRAVENOUS | Status: DC | PRN

## 2017-12-27 MED ORDER — COLD PACK MISC ONCOLOGY
1.0000 | Freq: Every day | Status: DC | PRN
  Filled 2017-12-27: qty 1

## 2017-12-27 MED ORDER — PANTOPRAZOLE SODIUM 40 MG PO TBEC
40.0000 mg | DELAYED_RELEASE_TABLET | Freq: Every day | ORAL | Status: DC
  Administered 2017-12-28 – 2018-01-01 (×5): 40 mg via ORAL
  Filled 2017-12-27 (×5): qty 1

## 2017-12-27 MED ORDER — DIPHENHYDRAMINE HCL 50 MG/ML IJ SOLN
50.0000 mg | Freq: Once | INTRAMUSCULAR | Status: DC | PRN
  Filled 2017-12-27: qty 1

## 2017-12-27 MED ORDER — HEPARIN SOD (PORK) LOCK FLUSH 100 UNIT/ML IV SOLN: 250.0000 [IU] | Freq: Once | INTRAVENOUS | Status: DC | PRN

## 2017-12-27 MED ORDER — SODIUM CHLORIDE 0.9 % IV SOLN: Freq: Once | INTRAVENOUS | Status: DC | PRN

## 2017-12-27 MED ORDER — SODIUM CHLORIDE 0.9% FLUSH: 10.0000 mL | INTRAVENOUS | Status: DC | PRN

## 2017-12-27 MED ORDER — CLONAZEPAM 0.25 MG PO TBDP
0.5000 mg | ORAL_TABLET | Freq: Two times a day (BID) | ORAL | Status: DC
  Administered 2017-12-27 – 2017-12-29 (×4): 0.5 mg via ORAL
  Filled 2017-12-27 (×4): qty 2

## 2017-12-27 MED ORDER — SODIUM CHLORIDE 0.9 % IV SOLN
250.0000 mg/m2 | Freq: Once | INTRAVENOUS | Status: AC
  Administered 2017-12-27: 21:00:00 612 mg via INTRAVENOUS
  Filled 2017-12-27: qty 102

## 2017-12-27 MED ORDER — RIVAROXABAN 20 MG PO TABS
20.0000 mg | ORAL_TABLET | Freq: Every day | ORAL | Status: DC
  Administered 2017-12-28 – 2018-01-01 (×5): 20 mg via ORAL
  Filled 2017-12-27 (×5): qty 1

## 2017-12-27 MED ORDER — CLOBETASOL PROPIONATE 0.05 % EX OINT
TOPICAL_OINTMENT | Freq: Two times a day (BID) | CUTANEOUS | Status: DC
  Administered 2017-12-28 – 2018-01-01 (×5): via TOPICAL
  Filled 2017-12-27: qty 15

## 2017-12-27 MED ORDER — DOCUSATE SODIUM 100 MG PO CAPS: 100.0000 mg | ORAL_CAPSULE | Freq: Two times a day (BID) | ORAL | Status: DC | PRN

## 2017-12-27 MED ORDER — PREDNISONE 20 MG PO TABS: 30.0000 mg | ORAL_TABLET | Freq: Two times a day (BID) | ORAL | Status: DC

## 2017-12-27 MED ORDER — POTASSIUM CHLORIDE CRYS ER 10 MEQ PO TBCR
10.0000 meq | EXTENDED_RELEASE_TABLET | Freq: Two times a day (BID) | ORAL | Status: DC
  Administered 2017-12-28 – 2018-01-01 (×9): 10 meq via ORAL
  Filled 2017-12-27 (×9): qty 1

## 2017-12-27 MED ORDER — OXYCODONE HCL 5 MG PO TABS
10.0000 mg | ORAL_TABLET | Freq: Three times a day (TID) | ORAL | Status: DC | PRN
  Administered 2017-12-27 – 2018-01-01 (×13): 10 mg via ORAL
  Filled 2017-12-27 (×13): qty 2

## 2017-12-27 NOTE — H&P (Signed)
[facility] [place] NAME: Edgar [name]    MR#:  [id number]  DATE OF BIRTH:  1990-07-08  DATE OF ADMISSION:  12/27/2017  PRIMARY CARE PHYSICIAN: [name], MD   REQUESTING/REFERRING PHYSICIAN: Corcoran  CHIEF COMPLAINT:  No chief complaint on file.   HISTORY OF PRESENT ILLNESS: Edgar [name]  is a 27 y.o. male with a known history of asthma, testicular cancer, DVT, gastroesophageal reflux disease, steel's syndrome-sent from cancer center for his scheduled inpatient chemotherapy. The chemotherapy courses for 5 days IV therapy and patient need to be inpatient for that.  He denies any complaints currently.  PAST MEDICAL HISTORY:   Past Medical History:  Diagnosis Date  . Asthma    AS A CHILD-NO INHALERS  . Cancer ([place])    testicular cancer 11/2016 per pt   . DVT (deep venous thrombosis) ([place])   . GERD (gastroesophageal reflux disease)    TUMS PRN    PAST SURGICAL HISTORY:  Past Surgical History:  Procedure Laterality Date  . ANKLE FRACTURE SURGERY Right 2004  . FINE NEEDLE ASPIRATION BIOPSY N/A 11/09/2017   Procedure: FINE NEEDLE ASPIRATION BIOPSY;  Surgeon: [name], MD;  Location: [facility];  Service: Cardiovascular;  Laterality: N/A;  . IVC FILTER INSERTION    . ORCHIECTOMY Left 11/26/2015   Procedure: ORCHIECTOMY;  Surgeon: [name], MD;  Location: [facility];  Service: Urology;  Laterality: Left;  radical/Inguinal approach  . PERIPHERAL VASCULAR CATHETERIZATION N/A 12/16/2015   Procedure: [name] Cath Insertion;  Surgeon: [name], MD;  Location: [facility];  Service: Cardiovascular;  Laterality: N/A;  . PERIPHERAL VASCULAR THROMBECTOMY Left 10/13/2017   Procedure: PERIPHERAL VASCULAR THROMBECTOMY;  Surgeon: [name], MD;  Location: [facility];  Service: Cardiovascular;  Laterality: Left;  . WISDOM TOOTH EXTRACTION  2017    SOCIAL HISTORY:  Social History    Tobacco Use  . Smoking status: Current Every Day Smoker    Packs/day: 0.50    Years: 8.00    Pack years: 4.00    Types: Cigarettes  . Smokeless tobacco: Former Systems developer    Types: Snuff  Substance Use Topics  . Alcohol use: No    FAMILY HISTORY:  Family History  Problem Relation Age of Onset  . Skin cancer Mother   . Breast cancer Maternal Grandmother   . Colon cancer Maternal Grandfather   . Diabetes Maternal Grandfather   . Emphysema Father   . Mental illness Sister        anxiety  . Stroke Paternal Grandmother   . Prostate cancer Neg Hx   . Kidney cancer Neg Hx   . Bladder Cancer Neg Hx     DRUG ALLERGIES: No Known Allergies  REVIEW OF SYSTEMS:   CONSTITUTIONAL: No fever, fatigue or weakness.  EYES: No blurred or double vision.  EARS, NOSE, AND THROAT: No tinnitus or ear pain.  RESPIRATORY: No cough, shortness of breath, wheezing or hemoptysis.  CARDIOVASCULAR: No chest pain, orthopnea, edema.  GASTROINTESTINAL: No nausea, vomiting, diarrhea or abdominal pain.  GENITOURINARY: No dysuria, hematuria.  ENDOCRINE: No polyuria, nocturia,  HEMATOLOGY: No anemia, easy bruising or bleeding SKIN: No rash or lesion. MUSCULOSKELETAL: No joint pain or arthritis.   NEUROLOGIC: No tingling, numbness, weakness.  PSYCHIATRY: No anxiety or depression.   MEDICATIONS AT HOME:  Prior to Admission medications   Medication Sig Start Date End Date Taking? Authorizing Provider  clobetasol ointment (TEMOVATE)  0.05 % Apply topically 1-2 times a day to the area on your left calf with bandage changes 12/22/17   [provider]  clonazePAM (KLONOPIN) 0.5 MG disintegrating tablet Take 1 tablet (0.5 mg total) by mouth 2 (two) times daily. Patient not taking: Reported on 12/27/2017 12/11/17   Bettey Costa, MD  gabapentin (NEURONTIN) 300 MG capsule Take 300 mg by mouth at bedtime.    [provider]  magnesium oxide (MAG-OX) 400 MG tablet Take 400 mg by mouth daily.    [provider]  omeprazole (PRILOSEC) 20 MG capsule Take 1 capsule (20 mg total) by mouth daily. 12/14/17   Karen Kitchens, NP  Oxycodone HCl 10 MG TABS Take 1 tablet (10 mg total) by mouth every 8 (eight) hours as needed. 12/21/17   Karen Kitchens, NP  potassium chloride (K-DUR) 10 MEQ tablet Take 10 mEq by mouth 2 (two) times daily.    [provider]  predniSONE (DELTASONE) 10 MG tablet Take 3 tablets (30 mg total) by mouth 2 (two) times daily with a meal. Patient taking differently: Take 30 mg by mouth 2 (two) times daily with a meal.  12/14/17   Corcoran, Drue Second, MD  XARELTO 20 MG TABS tablet Take 1 tablet by mouth daily. 09/26/17   [provider]      PHYSICAL EXAMINATION:   VITAL SIGNS: Blood pressure (!) 130/98, pulse (!) 121, temperature 98.6 F (37 C), temperature source Oral, resp. rate 18, height 6\' 3"  (1.905 m), weight 115.9 kg, SpO2 100 %.  GENERAL:  27 y.o.-year-old patient lying in the bed with no acute distress.  EYES: Pupils equal, round, reactive to light and accommodation. No scleral icterus. Extraocular muscles intact.  HEENT: Head atraumatic, normocephalic. Oropharynx and nasopharynx clear.  NECK:  Supple, no jugular venous distention. No thyroid enlargement, no tenderness.  LUNGS: Normal breath sounds bilaterally, no wheezing, rales,rhonchi or crepitation. No use of accessory muscles of respiration.  CARDIOVASCULAR: S1, S2 normal. No murmurs, rubs, or gallops.  ABDOMEN: Soft, nontender, nondistended. Bowel sounds present. No organomegaly or mass.  EXTREMITIES: No pedal edema, cyanosis, or clubbing.  Left calf have approximately 2 inch size skin ulcer. NEUROLOGIC: Cranial nerves II through XII are intact. Muscle strength 5/5 in all extremities. Sensation intact. Gait not checked.  PSYCHIATRIC: The patient is alert and oriented x 3.  SKIN: No obvious rash, lesion, or ulcer.   LABORATORY PANEL:   CBC Recent Labs  Lab 12/27/17 1440  WBC 11.3*  HGB  12.8*  HCT 40.6  PLT 392  MCV 99.5  MCH 31.4  MCHC 31.5  RDW 18.6*  LYMPHSABS 0.6*  MONOABS 0.6  EOSABS 0.0  BASOSABS 0.0   ------------------------------------------------------------------------------------------------------------------  Chemistries  Recent Labs  Lab 12/27/17 1440  NA 139  K 4.3  CL 103  CO2 24  GLUCOSE 167*  BUN 16  CREATININE 0.97  CALCIUM 10.5*  MG 2.1  AST 26  ALT 35  ALKPHOS 55  BILITOT 0.9   ------------------------------------------------------------------------------------------------------------------ estimated creatinine clearance is 154.3 mL/min (by C-G formula based on SCr of 0.97 mg/dL). ------------------------------------------------------------------------------------------------------------------ No results for input(s): TSH, T4TOTAL, T3FREE, THYROIDAB in the last 72 hours.  Invalid input(s): FREET3   Coagulation profile No results for input(s): INR, PROTIME in the last 168 hours. ------------------------------------------------------------------------------------------------------------------- No results for input(s): DDIMER in the last 72 hours. -------------------------------------------------------------------------------------------------------------------  Cardiac Enzymes No results for input(s): CKMB, TROPONINI, MYOGLOBIN in the last 168 hours.  Invalid input(s): CK ------------------------------------------------------------------------------------------------------------------ Invalid input(s):  POCBNP  ---------------------------------------------------------------------------------------------------------------  Urinalysis    Component Value Date/Time   COLORURINE STRAW (A) 12/07/2017 0300   APPEARANCEUR CLEAR (A) 12/07/2017 0300   APPEARANCEUR Clear 11/20/2015 1331   LABSPEC 1.003 (L) 12/07/2017 0300   PHURINE 6.0 12/07/2017 0300   GLUCOSEU NEGATIVE 12/07/2017 0300   HGBUR NEGATIVE 12/07/2017 0300    BILIRUBINUR NEGATIVE 12/07/2017 0300   BILIRUBINUR Negative 11/20/2015 1331   KETONESUR NEGATIVE 12/07/2017 0300   PROTEINUR NEGATIVE 12/07/2017 0300   NITRITE NEGATIVE 12/07/2017 0300   LEUKOCYTESUR NEGATIVE 12/07/2017 0300   LEUKOCYTESUR Negative 11/20/2015 1331     RADIOLOGY: No results found.  EKG: Orders placed or performed in visit on 12/07/17  . EKG 12-Lead  . EKG 12-Lead  . EKG 12-Lead    IMPRESSION AND PLAN:  *Testicular cancer Manage per oncologist. Chemotherapy inpatient to be started.  *DVT Continue anticoagulation with Xarelto.  *Steel's syndrome He has ulcers on the leg is due to drug reaction as advised by [facility] dermatology. Continue oral and topical steroid.  *Gastroesophageal reflux disease Continue PPI.  All the records are reviewed and case discussed with ED provider. Management plans discussed with the patient, family and they are in agreement.  CODE STATUS: Full. Code Status History    Date Active Date Inactive Code Status Order ID Comments User Context   12/07/2017 1801 12/11/2017 1509 Full Code [id number]  [name], MD Inpatient   11/22/2017 1123 11/27/2017 1438 Full Code [id number]  Saundra Shelling, MD Inpatient   11/05/2017 1442 11/09/2017 2046 Full Code [id number]  Gorden Harms, MD Inpatient   10/21/2017 1110 10/26/2017 1635 Full Code [id number]  Vaughan Basta, MD Inpatient   10/08/2017 1621 10/16/2017 1836 Full Code [id number]  Vaughan Basta, MD Inpatient       TOTAL TIME TAKING CARE OF THIS PATIENT: 45 minutes.    Vaughan Basta M.D on 12/27/2017   Between 7am to 6pm - Pager - [phone]  After 6pm go to [email or web address] - password [email or web address] [facility]  Office  [phone]  CC: Primary care physician; [name], MD   Note: This dictation was prepared with Dragon dictation along with smaller phrase technology. Any transcriptional errors that result from this process are  unintentional.

## 2017-12-27 NOTE — Progress Notes (Signed)
[facility] day:  12/27/2017  Chief Complaint: Edgar [name] is a 27 y.o. male with recurrent testicular cancer who is seen for assessment prior to admission for cycle #4 TIP chemotherapy.  HPI: The patient was last seen in the medical oncology clinic on 12/20/2017.  At that time,  He described nausea and vomiting that morning.  Right foot tenderness and swelling had improved.  He had more pain in the wound to his left posterior leg.  Wound care was being performed twice daily.  He denied any fevers.  He was eating well; weight up 4 pounds.  Exam revealed some improvement to left lower extremity wound.  Wound appeared to be more dry today.  WBC was 7100 (ANC 5900).  Hemoglobin was 9.3, hematocrit 27.2, and platelets 363,000.  Beta HCG and AFP were normal.  Prednisone was decreased to 20 mg a day.  Because of poor wound healing of the left calf, decision was made to postpone chemotherapy.  He was referred to Dr. Marolyn Hammock, dermatologist at [facility].  He was seen by Dr. Marolyn Hammock on 12/22/2017.  He received a Kenalog injection.  He was prescribed clobetasol 0.05% ointment 1-2 x/day to the left calf with bandage changes.  Decision was made to continue prednisone 20 mg a day without taper.  During the interim, he has done well.  Energy level has improved.  He continues to have pain/discomfort in the left calf lesion.  He notes very little discomfort in his right foot.  He is able to bend his toes more.  He notes a little numbness at the tips of his fingertips not affecting function.   Past Medical History:  Diagnosis Date  . Asthma    AS A CHILD-NO INHALERS  . Cancer ([place])    testicular cancer 11/2016 per pt   . DVT (deep venous thrombosis) ([place])   . GERD (gastroesophageal reflux disease)    TUMS PRN    Past Surgical History:  Procedure Laterality Date  . ANKLE FRACTURE SURGERY Right 2004  . FINE NEEDLE ASPIRATION BIOPSY N/A 11/09/2017   Procedure: FINE  NEEDLE ASPIRATION BIOPSY;  Surgeon: [name], MD;  Location: [facility];  Service: Cardiovascular;  Laterality: N/A;  . IVC FILTER INSERTION    . ORCHIECTOMY Left 11/26/2015   Procedure: ORCHIECTOMY;  Surgeon: [name], MD;  Location: [facility];  Service: Urology;  Laterality: Left;  radical/Inguinal approach  . PERIPHERAL VASCULAR CATHETERIZATION N/A 12/16/2015   Procedure: [name] Cath Insertion;  Surgeon: [name], MD;  Location: [facility];  Service: Cardiovascular;  Laterality: N/A;  . PERIPHERAL VASCULAR THROMBECTOMY Left 10/13/2017   Procedure: PERIPHERAL VASCULAR THROMBECTOMY;  Surgeon: [name], MD;  Location: [facility];  Service: Cardiovascular;  Laterality: Left;  . WISDOM TOOTH EXTRACTION  2017    Family History  Problem Relation Age of Onset  . Skin cancer Mother   . Breast cancer Maternal Grandmother   . Colon cancer Maternal Grandfather   . Diabetes Maternal Grandfather   . Emphysema Father   . Mental illness Sister        anxiety  . Stroke Paternal Grandmother   . Prostate cancer Neg Hx   . Kidney cancer Neg Hx   . Bladder Cancer Neg Hx     Social History:  reports that he has been smoking cigarettes. He has a 4.00 pack-year smoking history. He has quit using smokeless tobacco.  His  smokeless tobacco use included snuff. He reports that he has current or past drug history. Drug: Marijuana. He reports that he does not drink alcohol.  He smokes 1/2 pack a day.  He smokes marijuana.  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  He has a 4 year-old-daughter named [name]. The patient's mother's cell phone number is [phone].  Another contact number is [phone].  The patient lives in [place]. He is accompanied by his parents today.   Allergies: No Known Allergies  Current Medications: Current Outpatient Medications  Medication Sig Dispense Refill  . clobetasol ointment (TEMOVATE) 0.05 % Apply  topically 1-2 times a day to the area on your left calf with bandage changes    . gabapentin (NEURONTIN) 300 MG capsule Take 300 mg by mouth at bedtime.    . magnesium oxide (MAG-OX) 400 MG tablet Take 400 mg by mouth daily.    Marland Kitchen omeprazole (PRILOSEC) 20 MG capsule Take 1 capsule (20 mg total) by mouth daily. 30 capsule 1  . Oxycodone HCl 10 MG TABS Take 1 tablet (10 mg total) by mouth every 8 (eight) hours as needed. 30 tablet 0  . potassium chloride (K-DUR) 10 MEQ tablet Take 10 mEq by mouth 2 (two) times daily.    . predniSONE (DELTASONE) 10 MG tablet Take 3 tablets (30 mg total) by mouth 2 (two) times daily with a meal. (Patient taking differently: Take 30 mg by mouth 2 (two) times daily with a meal. ) 50 tablet 0  . XARELTO 20 MG TABS tablet Take 1 tablet by mouth daily.    . clonazePAM (KLONOPIN) 0.5 MG disintegrating tablet Take 1 tablet (0.5 mg total) by mouth 2 (two) times daily. (Patient not taking: Reported on 12/27/2017) 60 tablet 0   No current facility-administered medications for this visit.    Facility-Administered Medications Ordered in Other Visits  Medication Dose Route Frequency Provider Last Rate Last Dose  . heparin lock flush 100 unit/mL  500 Units Intravenous Once Corcoran, [name], MD      . heparin lock flush 100 unit/mL  500 Units Intravenous Once Corcoran, [name], MD      . sodium chloride flush (NS) 0.9 % injection 10 mL  10 mL Intravenous PRN Nolon Stalls C, MD   10 mL at 10/21/17 1008    Review of Systems  Constitutional: Negative for chills, diaphoresis, fever, malaise/fatigue and weight loss (16 pounds).       Energy level improved.  HENT: Negative.  Negative for congestion, ear discharge, ear pain, nosebleeds, sinus pain, sore throat and tinnitus.   Eyes: Negative.  Negative for blurred vision, double vision, photophobia, pain and discharge.  Respiratory: Negative.  Negative for cough, hemoptysis, sputum production and shortness of breath.     Cardiovascular: Negative for chest pain, palpitations, orthopnea, leg swelling and PND.       TACHYcardia  Gastrointestinal: Negative.  Negative for abdominal pain, blood in stool, constipation, diarrhea, melena, nausea and vomiting.  Genitourinary: Negative.  Negative for dysuria, frequency, hematuria and urgency.  Musculoskeletal: Negative for back pain, falls, joint pain, myalgias and neck pain.       Improved movement in right toes.  Skin: Negative for itching and rash.       Lesion to posterior aspect of LLE slowly healing.  Neurological: Positive for tingling (tips of fingers). Negative for dizziness, tremors, sensory change, speech change, focal weakness, weakness and headaches.  Endo/Heme/Allergies: Does not bruise/bleed easily.  Psychiatric/Behavioral:  Negative for memory loss and suicidal ideas. The patient has insomnia (steroid induced). The patient is not nervous/anxious.   All other systems reviewed and are negative.  Performance status (ECOG): 1 - 2  Vital Signs BP 124/82 (BP Location: Left Arm, Patient Position: Sitting)   Pulse (!) 128 Comment: manually  Temp 97.7 F (36.5 C) (Tympanic)   Resp 18   Wt 251 lb (113.9 kg)   BMI 32.23 kg/m   Physical Exam  Constitutional: He is oriented to person, place, and time and well-developed, well-nourished, and in no distress.  HENT:  Head: Normocephalic and atraumatic.  Alopecia.  Poor dentition.  Eyes: Pupils are equal, round, and reactive to light. Conjunctivae and EOM are normal. No scleral icterus.  Brown eyes  Neck: Normal range of motion. Neck supple. No JVD present.  Cardiovascular: Regular rhythm and normal heart sounds. Exam reveals no gallop and no friction rub.  No murmur heard. Tacycardia  Pulmonary/Chest: Effort normal and breath sounds normal. No respiratory distress. He has no wheezes. He has no rales.  Abdominal: Soft. Bowel sounds are normal. He exhibits no distension and no mass. There is no tenderness.  There is no rebound and no guarding.  Musculoskeletal: He exhibits no edema or tenderness.  No tenderness to dorsal aspect of RIGHT foot s/p distant puncture wound. No evidence of infection.   Lymphadenopathy:    He has no cervical adenopathy.    He has no axillary adenopathy.       Right: No supraclavicular adenopathy present.       Left: No supraclavicular adenopathy present.  Neurological: He is alert and oriented to person, place, and time. Gait normal.  Skin: Skin is warm and dry.     Left calf lesion flat, slightly decreased in size (3.5 x 4 cm).  Psychiatric: Mood, affect and judgment normal.  Nursing note and vitals reviewed.   Medical photography: Lesion to posterior aspect of LEFT lower extremity                 12/27/2017                     12/20/2017                    12/17/2017                    Imaging studies: 11/28/2015:  Chest, abdomen, and pelvic CT revealed no mediastinal adenopathy or suspicious pulmonary nodules.   There were 2 prominent left periaortic retroperitoneal lymph nodes (1.7 cm and 0.9 cm) concerning for testicular nodal metastasis.  There were no abnormal lymph nodes above the renal veins.  There were postsurgical change in the left hemiscrotum and left inguinal canal.  There was a 3.3 cm soft tissue abnormality anterior to the left iliopsoas muscle which could represent a metastatic lymph node (N2 lesion).  12/17/2015:  Head MRI revealed no evidence of metastatic disease.   03/30/2016:  Chest, abdomen, and pelvic CT revealed interval resolution of left abdominal/pelvic lymphadenopathy.  There was no residual metastatic disease. 09/30/2016:  Chest, abdomen, and pelvic CT revealed no evidence of recurrent or metastatic disease.  09/10/2017:  Bilateral lower extremity duplex revealed evidence of obstruction in the left CFV, SFJ, and proximal femoral vein.  09/10/2017:  CT pelvic venogram revealed multiple retroperitoneal masses including large mass  within the left iliacus concerning for metastatic disease in the setting of prior testicular cancer.  Mass in the  left iliacus caused significant narrowing of the proximal left external iliac vein. There was filling defect within the distal left external iliac vein extending into the common femoral and proximal superficial femoral veins consistent with thrombus  09/11/2017:  Chest, abdomen, and pelvic CT revealed enlarged and morphologically abnormal 4.3 x 4.9 cm left pelvic sidewall and retroperitoneal lymph nodes (measuring up to 2.9 cm) concerning for metastatic disease given history of testicular cancer. Biopsy was suggested for confirmation. Prominent left inguinal lymph nodes could be metastatic or reactive in nature.  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  There was anasarca/swelling of the left lower extremity.  There was no evidence of metastatic disease in the chest. 09/13/2017:  Head MRI revealed no evidence of metastatic disease. 11/17/2017:  Right foot MRI revealed artifact in the plantar soft tissues over the first metatarsal head likely representing metallic foreign bodies as seen on previous radiographs.  There was an area of increased T2 signal intensity in the plantar aspect of the first metatarsal head with focal enhancement may indicate an area of focal osteomyelitis.  There was no soft tissue abscess. 12/07/2017:  Abdomen and pelvic CTrevealed no new or progressive metastatic disease.There was no acute vascular abnormality. IVC filter and left iliac venous stent graft werein place. There was persistent postsurgical fat stranding throughout the leftinguinal region without fluid collection 12/08/2017:  Bilateral lower extremity duplex revealed new nonocclusive thrombus in the left common femoralveinalong the anterior wall measuring 3-5 mm in thickness. This wasnot flow limiting but is new since the prior study  on 11/05/2017 and consistent with acute/subacute thrombus (old per Dr. Delana Meyer). There was no evidence of right lower extremity deep vein thrombosis   Admissions: He was admitted to [facility] from 10/08/2017 - 10/16/2017 with left lower extremity cellulitis.  He was treated with Cefepime and vancomycin.  Imaging revealed significant improvement in adenopathy with decreased pelvic sidewall disease and decreased bulk at aortic bifurcation.  He was felt to have venous congestion syndrome with skin lesions due to congestion.   He was admitted to [facility] from 10/21/2017 - 10/26/2017 for inpatient chemotherapy (cycle #2 TIP).    He was admitted to [facility] from 11/05/2017 - 11/09/2017 for cellulitis.  He was developing similar lower extremity skin lesions following his first cycle of chemotherapy.  Skin biopsy revealed diffuse neutrophilic dermatitis. Cultures were negative.  Xarelto was held secondary to thrombocytopenia (nadir platelet count 39,000).  He was treated with heparin.  Lower extremity duplex revealed no clot.  He was treated with Cefepime and vancomycin.  He was discharged on Augmentin.  He received oral acyclovir for herpetic labialis.  He was admitted to [facility] from 11/22/2017 - 11/27/2017 for inpatient chemotherapy (cycle #3 TIP).    He was admitted to [facility] from 12/07/2017 - 12/11/2017 with fever and lower extremity edema.  He developed recurrent leg lesions. 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) and began Solumedrol 80 mg IV q day on 12/09/2017.  He fevers quickly resolved.  He received 1 unit of PRBCs.  Platelets became > 50,000 on 12/11/2017 and Xarelto was restarted.   Clinical Support on 12/27/2017  Component Date Value Ref Range Status  . WBC 12/27/2017 11.3* 4.0 - 10.5 K/uL Final  . RBC 12/27/2017 4.08* 4.22 - 5.81 MIL/uL Final  . Hemoglobin 12/27/2017 12.8* 13.0 - 17.0 g/dL Final  . HCT 12/27/2017 40.6   39.0 -  52.0 % Final  . MCV 12/27/2017 99.5  80.0 - 100.0 fL Final  . MCH 12/27/2017 31.4  26.0 - 34.0 pg Final  . MCHC 12/27/2017 31.5  30.0 - 36.0 g/dL Final  . RDW 12/27/2017 18.6* 11.5 - 15.5 % Final  . Platelets 12/27/2017 392  150 - 400 K/uL Final  . nRBC 12/27/2017 0.0  0.0 - 0.2 % Final  . Neutrophils Relative % 12/27/2017 88  % Final  . Neutro Abs 12/27/2017 10.0* 1.7 - 7.7 K/uL Final  . Lymphocytes Relative 12/27/2017 6  % Final  . Lymphs Abs 12/27/2017 0.6* 0.7 - 4.0 K/uL Final  . Monocytes Relative 12/27/2017 5  % Final  . Monocytes Absolute 12/27/2017 0.6  0.1 - 1.0 K/uL Final  . Eosinophils Relative 12/27/2017 0  % Final  . Eosinophils Absolute 12/27/2017 0.0  0.0 - 0.5 K/uL Final  . Basophils Relative 12/27/2017 0  % Final  . Basophils Absolute 12/27/2017 0.0  0.0 - 0.1 K/uL Final  . Immature Granulocytes 12/27/2017 1  % Final  . Abs Immature Granulocytes 12/27/2017 0.07  0.00 - 0.07 K/uL Final   Performed at [facility], [address], [place], [place] [address]    Assessment:  RISHI VICARIO is a 27 y.o. male with recurrent testicular cancer.  He presented with a left lower extremity DVT on 09/10/2017.  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  Clinical/pathologic stage is T1 N1-2 S0 M0.  He received 3 cycles of  BEP (12/30/2015 - 02/24/2016).  He received OnPro Neulasta with cycle #2 and #3.  He declined evaluation for retroperitoneal lymph node dissection (RPLND).  He declined sperm banking.  Pre surgery labs on 11/20/2015 revealed an AFP of 411.9, beta-HCG 2,669, and LDH 522 (121-224).    Tumor markers have been followed:   AFP was 411.9 (0-8.3) on 11/20/2015, 11.2 on 12/19/2015, 6.3 on 12/23/2015, 1.3 on 01/27/2016, 1.8 on 02/17/2016,  1.9 on 03/30/2016, 1.4 on 05/27/2016, 1.0 on 07/29/2016, 1.4 on 10/14/2016, 3.3 on 12/24/2016, 1 on 09/10/2017, 1 on 09/20/2017,  2.6 on 10/21/2017, 2.1 on 11/16/2017, and 2.1 on 12/20/2017.    Beta-HCG was 2,669 (0-3) on 11/20/2015, 2 on 12/19/2015, 1 on 12/23/2015, < 1 on 01/27/2016, < 1 on 02/17/2016, < 1 on 03/30/2016, < 1 on 05/27/2016,  < 1 on 07/29/2016, < 1 on 10/14/2016, and 3 on 12/24/2016, < 5 on 09/11/2017,  < 5 on 09/20/2017, < 1 on 10/21/2017, < 1 on 11/16/2017, and < 1 on 12/20/2017.  LDH was 522 (98-192) on 11/20/2015, 179 on 12/19/2015, 160 on 12/23/2015, 202 on 01/27/2016, 156 on 02/10/2016, 161 on 02/17/2016, 172 on 03/30/2016, 178 on 05/27/2016, 150 on 07/29/2016, 152 on 10/14/2016, 145 on 12/24/2016, 1557 on 09/11/2017, 916 on 09/20/2017, 144 on 10/21/2017, 158 on 11/16/2017, 203 on 12/20/2017, and 151 on 12/27/2017.  Chest, abdomen, and pelvic CT on 09/11/2017 revealed enlarged and morphologically abnormal 4.3 x 4.9 cm left pelvic sidewall and retroperitoneal lymph nodes (measuring up to 2.9 cm).  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  There was anasarca/swelling of the left lower extremity.  There was no evidence of metastatic disease in the chest.  Head MRI on 09/13/2017 revealed no evidence of metastatic disease.  He has a left lower extremity DVT.  Bilateral lower extremity duplex on 09/10/2017 revealed evidence of obstruction in the left CFV, SFJ, and proximal femoral vein.  He is on Xarelto.  He is s/p cycle #3 TIP chemotherapy (09/21/2017 - 11/27/2017) with Margarette Canada support. Cycle #1 was administered at [facility]. Cycles #2 and 3 were administered at [facility].  Both cycles 1 and 2 were complicated by lower extremity skin lesions on recovery of counts.  Audiogram on 10/27/2017 revealed hearing within normal limits.  He underwent IVC filterplacement, percutaneous angioplastyand stent placementof the left  common iliac vein and left external iliac vein on 10/13/2017.  He was discharged on Flagyl, doxycycline, and Levaquin.  Hypercoagulable work-up on 10/12/2017 negative for the following: Factor V Leiden, prothrombin gene mutation, lupus anticoagulant, anticardiolipin antibodies, beta-2 glycoprotein, protein C activity/antigen, protein S activity/antigen. ATIII was 54% (75-120) on heparin.  He has a history of right lower molar abscess s/p extraction on 11/19/2017.  He was treated with amoxicillin.  He stepped on a nail with his right foot.  He was treated with antibiotics for a puncture wound.  He has been followed by podiatry.  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  He is on prednisone 30 mg daily.  Symptomatically, he is doing well.  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  Counts are normal.  Plan: 1. Labs today:  CBC with diff, CMP, Mg, LDH. 2. Recurrent testicular cancer:    Admit to hospital for cycle #4 TIP chemotherapy:  Paclitaxel 250 mg/m2 CI over 24 hours on Day 1  [place] 500 mg/m2 over 15 minutes before ifosfamide, then at 4 hours and 8 hours from the start of each ifosfamide dose on day 2-5.  Ifosfamide 1500 mg/m2 over 3 hours daily on days 2-5.  Cisplatin 25 mg/m2 over 60 minutes on days 2-5.  Hydration pre and post ifosfamide and cisplatin.  Premed for Taxol:  Famotidine, Benadryl, Decadron  Daily lab check:  CBC with diff, CMP, Mg, phosphorus, urinalysis.  Anticipate daily GCSF in outpatient department post chemotherapy.   Review chemotherapy plan for 4 cycles of TIP followed by re-evaluation and possible RPLD based on residual disease. 3. Sweet's syndrome:  Medication induced (Udencya).  Continues on daily steroids. Currently on prednisone 20 mg daily.   Review consult with Dr. Marolyn Hammock at [facility].  Discuss plan for continuation of prednisone 20 mg/day until reassessment next week  (01/05/2018) by Dr. Marolyn Hammock.  Continue wound care. 4. LEFT lower extremity DVT:  Continue anticoagulation (rivaroxaban).  Follow CBC as outpatient as he has required holding Xarelto when thrombocytopenic. 5. RIGHT foot tenderness (nail puncture):  Dorsal aspect of right foot non-tender.  He is able to move his toe easily.  Follow-up with Dr. Cleda Mccreedy (podiatry).  No signs of infection.  Previous concern for osteomyelitis. 6. Electrolyte wasting:  Potassium 4.6 and magnesium 2.0.  Continue magnesium and potassium supplementation.  Follow electrolytes daily during hospitalization. 7. Pain and symptom management Currently doing well on oxycodone 10 mg TID PRN Continue omeprazole for reflux. 8. Neuropathy: Slight neuropathy in fingertips not affecting function. Continue Taxol with last cycle of chemotherapy. 9. RTC on 01/03/2018 for MD assessment, labs (CBC with diff, BMP, Mg), and initiation of daily GCSF.   [name], MD  12/27/2017, 3:03 PM

## 2017-12-27 NOTE — Progress Notes (Signed)
Pt here for follow . Stated that wound of lower left leg is " still hurting -it may be getting better- I dont know. "  Then stated he" accidentally threw away his Klonopn -that's why Im so anxious " pulse 128, and 122 repeated manually.  Wrap to lower left leg removed. Open wound area covered. Noted to be bright yellow/ green in color -Dr Geralyn Flash to evaluate- left 4 x 4 area covered

## 2017-12-27 NOTE — Telephone Encounter (Signed)
Called report to [place], RN on 1-C for admission for chemotherapy.

## 2017-12-28 DIAGNOSIS — E876 Hypokalemia: Secondary | ICD-10-CM

## 2017-12-28 DIAGNOSIS — Z803 Family history of malignant neoplasm of breast: Secondary | ICD-10-CM

## 2017-12-28 DIAGNOSIS — F17210 Nicotine dependence, cigarettes, uncomplicated: Secondary | ICD-10-CM

## 2017-12-28 DIAGNOSIS — K219 Gastro-esophageal reflux disease without esophagitis: Secondary | ICD-10-CM

## 2017-12-28 DIAGNOSIS — Z9221 Personal history of antineoplastic chemotherapy: Secondary | ICD-10-CM

## 2017-12-28 DIAGNOSIS — I82402 Acute embolism and thrombosis of unspecified deep veins of left lower extremity: Secondary | ICD-10-CM

## 2017-12-28 DIAGNOSIS — L982 Febrile neutrophilic dermatosis [Sweet]: Secondary | ICD-10-CM

## 2017-12-28 DIAGNOSIS — R238 Other skin changes: Secondary | ICD-10-CM

## 2017-12-28 DIAGNOSIS — C6212 Malignant neoplasm of descended left testis: Secondary | ICD-10-CM

## 2017-12-28 DIAGNOSIS — Z7901 Long term (current) use of anticoagulants: Secondary | ICD-10-CM

## 2017-12-28 DIAGNOSIS — Z808 Family history of malignant neoplasm of other organs or systems: Secondary | ICD-10-CM

## 2017-12-28 LAB — CBC WITH DIFFERENTIAL/PLATELET
Abs Immature Granulocytes: 0.04 10*3/uL (ref 0.00–0.07)
Basophils Absolute: 0 10*3/uL (ref 0.0–0.1)
Basophils Relative: 0 %
Eosinophils Absolute: 0 10*3/uL (ref 0.0–0.5)
Eosinophils Relative: 0 %
HCT: 37.9 % — ABNORMAL LOW (ref 39.0–52.0)
Hemoglobin: 12.1 g/dL — ABNORMAL LOW (ref 13.0–17.0)
Immature Granulocytes: 1 %
Lymphocytes Relative: 5 %
Lymphs Abs: 0.4 10*3/uL — ABNORMAL LOW (ref 0.7–4.0)
MCH: 32.1 pg (ref 26.0–34.0)
MCHC: 31.9 g/dL (ref 30.0–36.0)
MCV: 100.5 fL — ABNORMAL HIGH (ref 80.0–100.0)
Monocytes Absolute: 0.1 10*3/uL (ref 0.1–1.0)
Monocytes Relative: 1 %
Neutro Abs: 6.9 10*3/uL (ref 1.7–7.7)
Neutrophils Relative %: 93 %
Platelets: 355 10*3/uL (ref 150–400)
RBC: 3.77 MIL/uL — ABNORMAL LOW (ref 4.22–5.81)
RDW: 18.2 % — ABNORMAL HIGH (ref 11.5–15.5)
WBC: 7.4 10*3/uL (ref 4.0–10.5)
nRBC: 0 % (ref 0.0–0.2)

## 2017-12-28 LAB — URINALYSIS, COMPLETE (UACMP) WITH MICROSCOPIC
Bacteria, UA: NONE SEEN
Bilirubin Urine: NEGATIVE
Glucose, UA: NEGATIVE mg/dL
Hgb urine dipstick: NEGATIVE
Ketones, ur: NEGATIVE mg/dL
Leukocytes, UA: NEGATIVE
Nitrite: NEGATIVE
Protein, ur: NEGATIVE mg/dL
Specific Gravity, Urine: 1.025 (ref 1.005–1.030)
pH: 7 (ref 5.0–8.0)

## 2017-12-28 LAB — COMPREHENSIVE METABOLIC PANEL
ALT: 29 U/L (ref 0–44)
AST: 20 U/L (ref 15–41)
Albumin: 4.6 g/dL (ref 3.5–5.0)
Alkaline Phosphatase: 46 U/L (ref 38–126)
Anion gap: 8 (ref 5–15)
BUN: 17 mg/dL (ref 6–20)
CO2: 25 mmol/L (ref 22–32)
Calcium: 9.5 mg/dL (ref 8.9–10.3)
Chloride: 105 mmol/L (ref 98–111)
Creatinine, Ser: 0.66 mg/dL (ref 0.61–1.24)
GFR calc Af Amer: >60 mL/min (ref >60)
GFR calc non Af Amer: >60 mL/min (ref >60)
Glucose, Bld: 155 mg/dL — ABNORMAL HIGH (ref 70–99)
Potassium: 4.6 mmol/L (ref 3.5–5.1)
Sodium: 138 mmol/L (ref 135–145)
Total Bilirubin: 0.8 mg/dL (ref 0.3–1.2)
Total Protein: 7.5 g/dL (ref 6.5–8.1)

## 2017-12-28 LAB — MAGNESIUM: Magnesium: 2 mg/dL (ref 1.7–2.4)

## 2017-12-28 LAB — PHOSPHORUS: Phosphorus: 3.9 mg/dL (ref 2.5–4.6)

## 2017-12-28 MED ORDER — PREDNISONE 20 MG PO TABS
20.0000 mg | ORAL_TABLET | Freq: Every day | ORAL | Status: DC
  Administered 2017-12-28 – 2018-01-01 (×5): 20 mg via ORAL
  Filled 2017-12-28 (×5): qty 1

## 2017-12-28 MED ORDER — PALONOSETRON HCL INJECTION 0.25 MG/5ML
0.2500 mg | Freq: Once | INTRAVENOUS | Status: AC
  Administered 2017-12-28: 0.25 mg via INTRAVENOUS
  Filled 2017-12-28: qty 5

## 2017-12-28 MED ORDER — SODIUM CHLORIDE 0.9 % IV SOLN
500.0000 mg/m2 | Freq: Once | INTRAVENOUS | Status: AC
  Administered 2017-12-28: 1250 mg via INTRAVENOUS
  Filled 2017-12-28: qty 12.5

## 2017-12-28 MED ORDER — SODIUM CHLORIDE 0.9 % IV SOLN
500.0000 mg/m2 | INTRAVENOUS | Status: AC
  Administered 2017-12-29 (×2): 1250 mg via INTRAVENOUS
  Filled 2017-12-28 (×2): qty 12.5

## 2017-12-28 MED ORDER — SODIUM CHLORIDE 0.9 % IV SOLN
Freq: Once | INTRAVENOUS | Status: AC
  Administered 2017-12-28: 23:00:00 via INTRAVENOUS
  Filled 2017-12-28: qty 5

## 2017-12-28 MED ORDER — SODIUM CHLORIDE 0.9 % IV SOLN
25.0000 mg/m2 | Freq: Once | INTRAVENOUS | Status: AC
  Administered 2017-12-29: 02:00:00 64 mg via INTRAVENOUS
  Filled 2017-12-28: qty 64

## 2017-12-28 MED ORDER — SODIUM CHLORIDE 0.9 % IV SOLN
Freq: Once | INTRAVENOUS | Status: AC
  Administered 2017-12-29: 01:00:00 via INTRAVENOUS
  Filled 2017-12-28: qty 76

## 2017-12-28 MED ORDER — POTASSIUM CHLORIDE 2 MEQ/ML IV SOLN
Freq: Once | INTRAVENOUS | Status: AC
  Administered 2017-12-28: 21:00:00 via INTRAVENOUS
  Filled 2017-12-28: qty 10

## 2017-12-28 NOTE — Consult Note (Signed)
[facility]  Date of admission:  12/27/2017  Inpatient day:  12/28/2017  Consulting physician:  Dr. Vaughan Basta.  Reason for Consultation:  Chemotherapy.  Chief Complaint: Edgar [name] is a 27 y.o. male with recurrent testicular cancer who was admitted through the medical oncology clinic for cycle #4 TIP chemotherapy.  HPI:  The patient was diagnosed with stage IIB testicular cancer in 11/26/2015.  He received 3 cycles of BEP chemotherapy.  He recurred in 08/2017.  He presented with a left lower extremity DVT.  He was started on Xarelto,  He has received 3 cycles of TIP chemotherapy to date (last cycle 11/22/2017).  Chemotherapy has been complicated by the eruption of hemorrhagic bullae on his lower extremities (left > right).  Biopsies revealed neutrophilic dermatosis.  He was diagnosed with Sweet's syndrome secondary to [facility]  He is currently on prednisone 20 mg a day.  Symptomatically, he feels fine.  Left calf "stings".  He denies any nausea or vomiting.   Past Medical History:  Diagnosis Date  . Asthma    AS A CHILD-NO INHALERS  . Cancer ([place])    testicular cancer 11/2016 per pt   . DVT (deep venous thrombosis) ([place])   . GERD (gastroesophageal reflux disease)    TUMS PRN    Past Surgical History:  Procedure Laterality Date  . ANKLE FRACTURE SURGERY Right 2004  . FINE NEEDLE ASPIRATION BIOPSY N/A 11/09/2017   Procedure: FINE NEEDLE ASPIRATION BIOPSY;  Surgeon: [name], MD;  Location: [place] [facility];  Service: Cardiovascular;  Laterality: N/A;  . IVC FILTER INSERTION    . ORCHIECTOMY Left 11/26/2015   Procedure: ORCHIECTOMY;  Surgeon: [name], MD;  Location: [facility];  Service: Urology;  Laterality: Left;  radical/Inguinal approach  . PERIPHERAL VASCULAR CATHETERIZATION N/A 12/16/2015   Procedure: [name] Cath Insertion;  Surgeon: [name], MD;  Location: [place] [facility];  Service: Cardiovascular;  Laterality:  N/A;  . PERIPHERAL VASCULAR THROMBECTOMY Left 10/13/2017   Procedure: PERIPHERAL VASCULAR THROMBECTOMY;  Surgeon: [name], MD;  Location: [place] [facility];  Service: Cardiovascular;  Laterality: Left;  . WISDOM TOOTH EXTRACTION  2017    Family History  Problem Relation Age of Onset  . Skin cancer Mother   . Breast cancer Maternal Grandmother   . Colon cancer Maternal Grandfather   . Diabetes Maternal Grandfather   . Emphysema Father   . Mental illness Sister        anxiety  . Stroke Paternal Grandmother   . Prostate cancer Neg Hx   . Kidney cancer Neg Hx   . Bladder Cancer Neg Hx     Social History:  reports that he has been smoking cigarettes. He has a 4.00 pack-year smoking history. He has quit using smokeless tobacco.  His smokeless tobacco use included snuff. He reports that he has current or past drug history. Drug: Marijuana. He reports that he does not drink alcohol.  He is accompanied by his mother and nurse.  He is alone today.  Allergies: No Known Allergies  Medications Prior to Admission  Medication Sig Dispense Refill  . clobetasol ointment (TEMOVATE) 0.05 % Apply topically 1-2 times a day to the area on your left calf with bandage changes    . clonazePAM (KLONOPIN) 0.5 MG disintegrating tablet Take 1 tablet (0.5 mg total) by mouth 2 (two) times daily. 60 tablet 0  . gabapentin (NEURONTIN) 300 MG capsule Take 300 mg by mouth at  bedtime.    . magnesium oxide (MAG-OX) 400 MG tablet Take 400 mg by mouth daily.    Marland Kitchen omeprazole (PRILOSEC) 20 MG capsule Take 1 capsule (20 mg total) by mouth daily. 30 capsule 1  . Oxycodone HCl 10 MG TABS Take 1 tablet (10 mg total) by mouth every 8 (eight) hours as needed. 30 tablet 0  . potassium chloride (K-DUR) 10 MEQ tablet Take 10 mEq by mouth 2 (two) times daily.    . predniSONE (DELTASONE) 10 MG tablet Take 3 tablets (30 mg total) by mouth 2 (two) times daily with a meal. (Patient taking differently: Take 20 mg by mouth  daily. ) 50 tablet 0  . XARELTO 20 MG TABS tablet Take 1 tablet by mouth daily.      Review of Systems: GENERAL:  Feels "fine".  No fevers or sweats. PERFORMANCE STATUS (ECOG):  1 HEENT:  No visual changes, runny nose, sore throat, mouth sores or tenderness. Lungs: No shortness of breath or cough.  No hemoptysis. Cardiac:  No chest pain, palpitations, orthopnea, or PND. GI:  No nausea, vomiting, diarrhea, constipation, melena or hematochezia. GU:  No urgency, frequency, dysuria, or hematuria. Musculoskeletal:  No back pain.  No joint pain.  No muscle tenderness. Extremities:  No pain or swelling. Skin:  Stinging sensation in left calf lesion.  Neuro:  Denies any neuropathy in fingertips.  No headache, numbness or weakness, balance or coordination issues. Endocrine:  No diabetes, thyroid issues, hot flashes or night sweats. Psych:  No mood changes, depression or anxiety. Pain:  Well controlled. Review of systems:  All other systems reviewed and found to be negative.   Physical Exam:  Blood pressure 130/90, pulse 82, temperature 97.9 F (36.6 C), temperature source Oral, resp. rate 20, height 6' 3"  (1.905 m), weight 255 lb 8.2 oz (115.9 kg), SpO2 99 %.  GENERAL:  Well developed, well nourished, young man sitting comfortably on the medical unit watching TV in no acute distress. MENTAL STATUS:  Alert and oriented to person, place and time. HEAD:  Alopecia.  Normocephalic, atraumatic, face symmetric, no Cushingoid features. EYES:  Brown eyes.  Pupils equal round and reactive to light and accomodation.  No conjunctivitis or scleral icterus. ENT:  Oropharynx clear without lesion.  Tongue normal. Mucous membranes moist.  RESPIRATORY:  Clear to auscultation without rales, wheezes or rhonchi. CARDIOVASCULAR:  Regular rate and rhythm without murmur, rub or gallop. ABDOMEN:  Soft, non-tender, with active bowel sounds, and no hepatosplenomegaly.  No masses. SKIN: Left calf unwrapped.  Left calf  lesion 4 cm moist (no change from 12/27/2017).  No increased warmth or surrounding erythema. EXTREMITIES: No edema, no skin discoloration or tenderness.  No palpable cords.  Bottom of right foot unremarkable. LYMPH NODES: No palpable cervical, supraclavicular, axillary or inguinal adenopathy  NEUROLOGICAL: Unremarkable. Finger to nose and RAM normal. PSYCH:  Appropriate.    Results for orders placed or performed during the hospital encounter of 12/27/17 (from the past 48 hour(s))  CBC with Differential     Status: Abnormal   Collection Time: 12/28/17  4:03 AM  Result Value Ref Range   WBC 7.4 4.0 - 10.5 K/uL   RBC 3.77 (L) 4.22 - 5.81 MIL/uL   Hemoglobin 12.1 (L) 13.0 - 17.0 g/dL   HCT 37.9 (L) 39.0 - 52.0 %   MCV 100.5 (H) 80.0 - 100.0 fL   MCH 32.1 26.0 - 34.0 pg   MCHC 31.9 30.0 - 36.0 g/dL   RDW 18.2 (  H) 11.5 - 15.5 %   Platelets 355 150 - 400 K/uL   nRBC 0.0 0.0 - 0.2 %   Neutrophils Relative % 93 %   Neutro Abs 6.9 1.7 - 7.7 K/uL   Lymphocytes Relative 5 %   Lymphs Abs 0.4 (L) 0.7 - 4.0 K/uL   Monocytes Relative 1 %   Monocytes Absolute 0.1 0.1 - 1.0 K/uL   Eosinophils Relative 0 %   Eosinophils Absolute 0.0 0.0 - 0.5 K/uL   Basophils Relative 0 %   Basophils Absolute 0.0 0.0 - 0.1 K/uL   Immature Granulocytes 1 %   Abs Immature Granulocytes 0.04 0.00 - 0.07 K/uL    Comment: Performed at [facility], [address] [place], [place] [address]  Comprehensive metabolic panel     Status: Abnormal   Collection Time: 12/28/17  4:03 AM  Result Value Ref Range   Sodium 138 135 - 145 mmol/L   Potassium 4.6 3.5 - 5.1 mmol/L   Chloride 105 98 - 111 mmol/L   CO2 25 22 - 32 mmol/L   Glucose, Bld 155 (H) 70 - 99 mg/dL   BUN 17 6 - 20 mg/dL   Creatinine, Ser 0.66 0.61 - 1.24 mg/dL   Calcium 9.5 8.9 - 10.3 mg/dL   Total Protein 7.5 6.5 - 8.1 g/dL   Albumin 4.6 3.5 - 5.0 g/dL   AST 20 15 - 41 U/L   ALT 29 0 - 44 U/L   Alkaline Phosphatase 46 38 - 126 U/L   Total  Bilirubin 0.8 0.3 - 1.2 mg/dL   GFR calc non Af Amer >60 >60 mL/min   GFR calc Af Amer >60 >60 mL/min    Comment: (NOTE) The eGFR has been calculated using the CKD EPI equation. This calculation has not been validated in all clinical situations. eGFR's persistently <60 mL/min signify possible Chronic Kidney Disease.    Anion gap 8 5 - 15    Comment: Performed at [facility], [address] [place], [place] [address]  Magnesium     Status: None   Collection Time: 12/28/17  4:03 AM  Result Value Ref Range   Magnesium 2.0 1.7 - 2.4 mg/dL    Comment: Performed at [facility], [address] [place], [place] [address]  Phosphorus     Status: None   Collection Time: 12/28/17  4:03 AM  Result Value Ref Range   Phosphorus 3.9 2.5 - 4.6 mg/dL    Comment: Performed at [facility], [address] [place], [place] [address]  Urinalysis, Complete w Microscopic     Status: Abnormal   Collection Time: 12/28/17  4:04 AM  Result Value Ref Range   Color, Urine YELLOW (A) YELLOW   APPearance CLEAR (A) CLEAR   Specific Gravity, Urine 1.025 1.005 - 1.030   pH 7.0 5.0 - 8.0   Glucose, UA NEGATIVE NEGATIVE mg/dL   Hgb urine dipstick NEGATIVE NEGATIVE   Bilirubin Urine NEGATIVE NEGATIVE   Ketones, ur NEGATIVE NEGATIVE mg/dL   Protein, ur NEGATIVE NEGATIVE mg/dL   Nitrite NEGATIVE NEGATIVE   Leukocytes, UA NEGATIVE NEGATIVE   RBC / HPF 0-5 0 - 5 RBC/hpf   WBC, UA 6-10 0 - 5 WBC/hpf   Bacteria, UA NONE SEEN NONE SEEN   Squamous Epithelial / LPF 0-5 0 - 5   Mucus PRESENT     Comment: Performed at [facility], [address] [place], [place] [address]   No results  found.  Assessment:  The patient is a 27 y.o. gentleman with recurrent testicular cancer who was admitted for cycle #4 TIP chemotherapy.  He has a history of LLE DVT.  He is s/p IVC filterplacement, percutaneous angioplastyand stent placementof the left common iliac vein and left external  iliac vein on 10/13/2017. He is on Xarelto.  He stepped on a nail on 11/01/2017.  Right foot MRI on 11/17/2017 revealed artifact in the plantar soft tissues over the first metatarsal head likely representing metallic foreign bodies.  There was an area of increased T2 signal intensity in the plantar aspect of the first metatarsal head with focal enhancement may indicate an area of focal osteomyelitis.  There was no soft tissue abscess.  He is followed by Dr. Cleda Mccreedy, podiatrist, without intervention.  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 He is on prednisone 20 mg daily.  He has electrolyte wasting (magnesium and potassium) secondary to cisplatin.  He is on supplementation.  Symptomatically, he denies any nausea or vomiting.  Left calf lesion "stings" (no change).  Plan:   1.   Oncology:  Cycle #4 TIP chemotherapy.    Paclitaxel 250 mg/m2 CI over 24 hours on Day 1- completes today.  Mesna 500 mg/m2 over 15 minutes before ifosfamide, then at 4 hours and 8 hours from the start of each ifosfamide dose on day 2-5.  Ifosfamide 1500 mg/m2 over 1 hour daily on days 2-5.  Cisplatin 25 mg/m2 over 60 minutes on days 2-5.  Hydration pre and post ifosfamide and cisplatin.  Premed for Taxol:  Famotidine, Benadryl, Decadron  Daily lab check:  CBC with diff, CMP, Mg, phosphorus, urinalysis.  Neuro checks daily.  Anticipate daily GCSF in outpatient department post chemotherapy  2.   Hematology:  Left lower extremity DVT s/p stent placement.  Continue Xarelto.  CBC daily.  3.   Dermatology:  Sweet's syndome on prednisone 20 mg a day.  Topical clobetasol.  Continue wound care to left calf.  4.   Fluids, Electrolyes and Nutrition:  Follow labs as above daily.  Continue outpatient electrolyte supplementation (potassium chloride 20 meq BID and magnesium oxide 400 mg a day).  Ensure total daily fluid 2-3 liters.  Check UA daily to assure no hemorrhagic cystitis.  5.   Disposition:   Expect discharge home after chemotherapy complete if tolerating well.  Will begin daily GCSF on Monday, 01/03/2018.  Thank you for allowing me to participate in Mr [place] care.  I will follow him closely while an inpatient and after discharge into the outpatient department.   [name], MD  12/28/2017, 1:31 PM

## 2017-12-28 NOTE — Progress Notes (Signed)
Patient ID: Edgar [name], male   DOB: 06-23-1990, 27 y.o.   MRN: [id number]  [employer] PROGRESS NOTE  MAAZ SPIERING HCW:[id number] DOB: [birth year] DOA: 12/27/2017 PCP: [name], MD  HPI/Subjective: Patient feels okay.  Offers no complaints.  Has active leg ulceration on his left calf.  Patient sent in for 5-day chemotherapy.  Objective: Vitals:   12/28/17 0532 12/28/17 1316  BP: (!) 140/96 130/90  Pulse: 71 82  Resp: 18 20  Temp: 97.7 F (36.5 C) 97.9 F (36.6 C)  SpO2: 99% 99%    Filed Weights   12/27/17 1644  Weight: 115.9 kg    ROS: Review of Systems  Constitutional: Negative for chills and fever.  Eyes: Negative for blurred vision.  Respiratory: Negative for cough and shortness of breath.   Cardiovascular: Negative for chest pain.  Gastrointestinal: Negative for abdominal pain, constipation, diarrhea, nausea and vomiting.  Genitourinary: Negative for dysuria.  Musculoskeletal: Negative for joint pain.  Neurological: Negative for dizziness and headaches.   Exam: Physical Exam  Constitutional: He is oriented to person, place, and time.  HENT:  Nose: No mucosal edema.  Mouth/Throat: No oropharyngeal exudate or posterior oropharyngeal edema.  Eyes: Pupils are equal, round, and reactive to light. Conjunctivae, EOM and lids are normal.  Neck: No JVD present. Carotid bruit is not present. No edema present. No thyroid mass and no thyromegaly present.  Cardiovascular: S1 normal and S2 normal. Exam reveals no gallop.  No murmur heard. Pulses:      Dorsalis pedis pulses are 2+ on the right side, and 2+ on the left side.  Respiratory: No respiratory distress. He has no wheezes. He has no rhonchi. He has no rales.  GI: Soft. Bowel sounds are normal. There is no tenderness.  Musculoskeletal:       Right ankle: He exhibits no swelling.       Left ankle: He exhibits no swelling.  Lymphadenopathy:    He has no cervical adenopathy.  Neurological: He is  alert and oriented to person, place, and time. No cranial nerve deficit.  Skin: Skin is warm. Nails show no clubbing.  Left calf active ulceration around 6 x 6 cm  Psychiatric: He has a normal mood and affect.      Data Reviewed: Basic Metabolic Panel: Recent Labs  Lab 12/27/17 1440 12/28/17 0403  NA 139 138  K 4.3 4.6  CL 103 105  CO2 24 25  GLUCOSE 167* 155*  BUN 16 17  CREATININE 0.97 0.66  CALCIUM 10.5* 9.5  MG 2.1 2.0  PHOS  --  3.9   Liver Function Tests: Recent Labs  Lab 12/27/17 1440 12/28/17 0403  AST 26 20  ALT 35 29  ALKPHOS 55 46  BILITOT 0.9 0.8  PROT 8.1 7.5  ALBUMIN 5.2* 4.6   CBC: Recent Labs  Lab 12/27/17 1440 12/28/17 0403  WBC 11.3* 7.4  NEUTROABS 10.0* 6.9  HGB 12.8* 12.1*  HCT 40.6 37.9*  MCV 99.5 100.5*  PLT 392 355    Scheduled Meds: . [START ON 12/29/2017] CISplatin  25 mg/m2 (Treatment Plan Recorded) Intravenous Once  . clobetasol ointment   Topical BID  . clonazePAM  0.5 mg Oral BID  . D5-1/2NS + KCL + MG +/- MANNITOL IV CISplatin hydration   Intravenous Once  . gabapentin  300 mg Oral QHS  . ifosfamide/mesna (IFEX/MESNEX) CHEMO IV infusion   Intravenous Once  . magnesium oxide  400 mg Oral Daily  . PACLitaxel  250 mg/m2 (Order-Specific) Intravenous Once  . palonosetron  0.25 mg Intravenous Once  . pantoprazole  40 mg Oral Daily  . potassium chloride  10 mEq Oral BID  . predniSONE  20 mg Oral Q breakfast  . rivaroxaban  20 mg Oral Daily   Continuous Infusions: . sodium chloride 10 mL/hr at 12/28/17 0446  . sodium chloride    . famotidine    . fosaprepitant (EMEND) IV infusion 150 mg + dexamethasone    . mesna    . [START ON 12/29/2017] mesna      Assessment/Plan:  1. Recurrent testicular cancer.  Chemotherapy as per oncology 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  Currently on 20 mg of prednisone 3. Left lower extremity DVT on Xarelto 4. Chronic pain on oxycodone 5. Neuropathy after  chemotherapy 6. GERD on PPI  Code Status:     Code Status Orders  (From admission, onward)         Start     Ordered   12/27/17 1707  Full code  Continuous     12/27/17 1706        Code Status History    Date Active Date Inactive Code Status Order ID Comments User Context   12/07/2017 1801 12/11/2017 1509 Full Code [id number]  [name], MD Inpatient   11/22/2017 1123 11/27/2017 1438 Full Code [id number]  Saundra Shelling, MD Inpatient   11/05/2017 1442 11/09/2017 2046 Full Code [id number]  Gorden Harms, MD Inpatient   10/21/2017 1110 10/26/2017 1635 Full Code [id number]  Vaughan Basta, MD Inpatient   10/08/2017 1621 10/16/2017 1836 Full Code [id number]  Vaughan Basta, MD Inpatient      Disposition Plan: We will go home after chemotherapy  Consultants:  Oncology  Time spent: 25 minutes  [place]

## 2017-12-28 NOTE — Plan of Care (Signed)

## 2017-12-29 DIAGNOSIS — M795 Residual foreign body in soft tissue: Secondary | ICD-10-CM

## 2017-12-29 DIAGNOSIS — E41 Nutritional marasmus: Secondary | ICD-10-CM

## 2017-12-29 DIAGNOSIS — R112 Nausea with vomiting, unspecified: Secondary | ICD-10-CM

## 2017-12-29 LAB — CBC WITH DIFFERENTIAL/PLATELET
Abs Immature Granulocytes: 0.02 10*3/uL (ref 0.00–0.07)
Basophils Absolute: 0 10*3/uL (ref 0.0–0.1)
Basophils Relative: 0 %
Eosinophils Absolute: 0 10*3/uL (ref 0.0–0.5)
Eosinophils Relative: 0 %
HCT: 35.5 % — ABNORMAL LOW (ref 39.0–52.0)
Hemoglobin: 11.4 g/dL — ABNORMAL LOW (ref 13.0–17.0)
Immature Granulocytes: 1 %
Lymphocytes Relative: 4 %
Lymphs Abs: 0.2 10*3/uL — ABNORMAL LOW (ref 0.7–4.0)
MCH: 31.8 pg (ref 26.0–34.0)
MCHC: 32.1 g/dL (ref 30.0–36.0)
MCV: 98.9 fL (ref 80.0–100.0)
Monocytes Absolute: 0.1 10*3/uL (ref 0.1–1.0)
Monocytes Relative: 2 %
Neutro Abs: 4.1 10*3/uL (ref 1.7–7.7)
Neutrophils Relative %: 93 %
Platelets: 273 10*3/uL (ref 150–400)
RBC: 3.59 MIL/uL — ABNORMAL LOW (ref 4.22–5.81)
RDW: 17.1 % — ABNORMAL HIGH (ref 11.5–15.5)
WBC: 4.3 10*3/uL (ref 4.0–10.5)
nRBC: 0 % (ref 0.0–0.2)

## 2017-12-29 LAB — COMPREHENSIVE METABOLIC PANEL
ALT: 23 U/L (ref 0–44)
AST: 17 U/L (ref 15–41)
Albumin: 3.8 g/dL (ref 3.5–5.0)
Alkaline Phosphatase: 41 U/L (ref 38–126)
Anion gap: 6 (ref 5–15)
BUN: 19 mg/dL (ref 6–20)
CO2: 26 mmol/L (ref 22–32)
Calcium: 9 mg/dL (ref 8.9–10.3)
Chloride: 104 mmol/L (ref 98–111)
Creatinine, Ser: 0.76 mg/dL (ref 0.61–1.24)
GFR calc Af Amer: >60 mL/min (ref >60)
GFR calc non Af Amer: >60 mL/min (ref >60)
Glucose, Bld: 162 mg/dL — ABNORMAL HIGH (ref 70–99)
Potassium: 4.1 mmol/L (ref 3.5–5.1)
Sodium: 136 mmol/L (ref 135–145)
Total Bilirubin: 0.8 mg/dL (ref 0.3–1.2)
Total Protein: 6.6 g/dL (ref 6.5–8.1)

## 2017-12-29 LAB — URINALYSIS, COMPLETE (UACMP) WITH MICROSCOPIC
Bacteria, UA: NONE SEEN
Bilirubin Urine: NEGATIVE
Glucose, UA: NEGATIVE mg/dL
Hgb urine dipstick: NEGATIVE
Ketones, ur: 80 mg/dL — AB
Leukocytes, UA: NEGATIVE
Nitrite: NEGATIVE
Protein, ur: NEGATIVE mg/dL
Specific Gravity, Urine: 1.021 (ref 1.005–1.030)
Squamous Epithelial / LPF: NONE SEEN (ref 0–5)
pH: 5 (ref 5.0–8.0)

## 2017-12-29 LAB — PHOSPHORUS: Phosphorus: 3.5 mg/dL (ref 2.5–4.6)

## 2017-12-29 LAB — MAGNESIUM: Magnesium: 2.5 mg/dL — ABNORMAL HIGH (ref 1.7–2.4)

## 2017-12-29 MED ORDER — POTASSIUM CHLORIDE 2 MEQ/ML IV SOLN
Freq: Once | INTRAVENOUS | Status: AC
  Administered 2017-12-29: 20:00:00 via INTRAVENOUS
  Filled 2017-12-29: qty 10

## 2017-12-29 MED ORDER — SODIUM CHLORIDE 0.9 % IV SOLN
500.0000 mg/m2 | Freq: Once | INTRAVENOUS | Status: AC
  Administered 2017-12-29: 23:00:00 1250 mg via INTRAVENOUS
  Filled 2017-12-29: qty 12.5

## 2017-12-29 MED ORDER — CLONAZEPAM 0.25 MG PO TBDP
0.5000 mg | ORAL_TABLET | Freq: Two times a day (BID) | ORAL | Status: DC | PRN
  Administered 2017-12-29 – 2018-01-01 (×6): 0.5 mg via ORAL
  Filled 2017-12-29 (×6): qty 2

## 2017-12-29 MED ORDER — SODIUM CHLORIDE 0.9 % IV SOLN
Freq: Once | INTRAVENOUS | Status: AC
  Administered 2017-12-30: via INTRAVENOUS
  Filled 2017-12-29: qty 76

## 2017-12-29 MED ORDER — SODIUM CHLORIDE 0.9 % IV SOLN
25.0000 mg/m2 | Freq: Once | INTRAVENOUS | Status: AC
  Administered 2017-12-30: 02:00:00 64 mg via INTRAVENOUS
  Filled 2017-12-29: qty 64

## 2017-12-29 MED ORDER — SODIUM CHLORIDE 0.9 % IV SOLN
20.0000 mg | Freq: Once | INTRAVENOUS | Status: AC
  Administered 2017-12-29: 20 mg via INTRAVENOUS
  Filled 2017-12-29: qty 2

## 2017-12-29 MED ORDER — SODIUM CHLORIDE 0.9 % IV SOLN
500.0000 mg/m2 | INTRAVENOUS | Status: AC
  Administered 2017-12-30 (×2): 1250 mg via INTRAVENOUS
  Filled 2017-12-29 (×3): qty 12.5

## 2017-12-29 NOTE — Progress Notes (Signed)
Patient ID: Edgar [name], male   DOB: 05/02/1990, 27 y.o.   MRN: [id number]  [employer] PROGRESS NOTE  ROYCE STEGMAN VOJ:[id number] DOB: 11-Dec-1990 DOA: 12/27/2017 PCP: [name], MD  HPI/Subjective: Patient feels tired because they keep him up all night running the chemo.  Offers no complaints.  No new lesions on the skin.  Objective: Vitals:   12/29/17 0425 12/29/17 1215  BP: 118/82 127/87  Pulse: 63 (!) 58  Resp: 14 20  Temp: 98 F (36.7 C) 98 F (36.7 C)  SpO2: 100% 98%    Filed Weights   12/27/17 1644  Weight: 115.9 kg    ROS: Review of Systems  Constitutional: Negative for chills and fever.  Eyes: Negative for blurred vision.  Respiratory: Negative for cough and shortness of breath.   Cardiovascular: Negative for chest pain.  Gastrointestinal: Negative for abdominal pain, constipation, diarrhea, nausea and vomiting.  Genitourinary: Negative for dysuria.  Musculoskeletal: Negative for joint pain.  Neurological: Negative for dizziness and headaches.   Exam: Physical Exam  Constitutional: He is oriented to person, place, and time.  HENT:  Nose: No mucosal edema.  Mouth/Throat: No oropharyngeal exudate or posterior oropharyngeal edema.  Eyes: Pupils are equal, round, and reactive to light. Conjunctivae, EOM and lids are normal.  Neck: No JVD present. Carotid bruit is not present. No edema present. No thyroid mass and no thyromegaly present.  Cardiovascular: S1 normal and S2 normal. Exam reveals no gallop.  No murmur heard. Pulses:      Dorsalis pedis pulses are 2+ on the right side, and 2+ on the left side.  Respiratory: No respiratory distress. He has no wheezes. He has no rhonchi. He has no rales.  GI: Soft. Bowel sounds are normal. There is no tenderness.  Musculoskeletal:       Right ankle: He exhibits no swelling.       Left ankle: He exhibits no swelling.  Lymphadenopathy:    He has no cervical adenopathy.  Neurological: He is alert  and oriented to person, place, and time. No cranial nerve deficit.  Skin: Skin is warm. Nails show no clubbing.  Left calf active ulceration around 6 x 6 cm  Psychiatric: He has a normal mood and affect.      Data Reviewed: Basic Metabolic Panel: Recent Labs  Lab 12/27/17 1440 12/28/17 0403 12/29/17 0619  NA 139 138 136  K 4.3 4.6 4.1  CL 103 105 104  CO2 24 25 26   GLUCOSE 167* 155* 162*  BUN 16 17 19   CREATININE 0.97 0.66 0.76  CALCIUM 10.5* 9.5 9.0  MG 2.1 2.0 2.5*  PHOS  --  3.9 3.5   Liver Function Tests: Recent Labs  Lab 12/27/17 1440 12/28/17 0403 12/29/17 0619  AST 26 20 17   ALT 35 29 23  ALKPHOS 55 46 41  BILITOT 0.9 0.8 0.8  PROT 8.1 7.5 6.6  ALBUMIN 5.2* 4.6 3.8   CBC: Recent Labs  Lab 12/27/17 1440 12/28/17 0403 12/29/17 0619  WBC 11.3* 7.4 4.3  NEUTROABS 10.0* 6.9 4.1  HGB 12.8* 12.1* 11.4*  HCT 40.6 37.9* 35.5*  MCV 99.5 100.5* 98.9  PLT 392 355 273    Scheduled Meds: . [START ON 12/30/2017] CISplatin  25 mg/m2 (Treatment Plan Recorded) Intravenous Once  . clobetasol ointment   Topical BID  . D5-1/2NS + KCL + MG +/- MANNITOL IV CISplatin hydration   Intravenous Once  . gabapentin  300 mg Oral QHS  . ifosfamide/mesna (  IFEX/MESNEX) CHEMO IV infusion   Intravenous Once  . pantoprazole  40 mg Oral Daily  . potassium chloride  10 mEq Oral BID  . predniSONE  20 mg Oral Q breakfast  . rivaroxaban  20 mg Oral Daily   Continuous Infusions: . sodium chloride 10 mL/hr (12/29/17 1512)  . sodium chloride    . dexamethasone (DECADRON) IVPB [facility]    . famotidine    . mesna    . [START ON 12/30/2017] mesna      Assessment/Plan:  1. Recurrent testicular cancer.  Chemotherapy as per oncology.  Likely will finish up and be discharged on Saturday morning if everything is going well. 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  Currently on 20 mg of prednisone 3. Left lower extremity DVT on Xarelto 4. Chronic pain on oxycodone 5. Neuropathy  after chemotherapy 6. GERD on PPI  Code Status:     Code Status Orders  (From admission, onward)         Start     Ordered   12/27/17 1707  Full code  Continuous     12/27/17 1706        Code Status History    Date Active Date Inactive Code Status Order ID Comments User Context   12/07/2017 1801 12/11/2017 1509 Full Code [id number]  [name], MD Inpatient   11/22/2017 1123 11/27/2017 1438 Full Code [id number]  Saundra Shelling, MD Inpatient   11/05/2017 1442 11/09/2017 2046 Full Code [id number]  Gorden Harms, MD Inpatient   10/21/2017 1110 10/26/2017 1635 Full Code [id number]  Vaughan Basta, MD Inpatient   10/08/2017 1621 10/16/2017 1836 Full Code [id number]  Vaughan Basta, MD Inpatient      Disposition Plan: We will go home after chemotherapy on Saturday  Consultants:  Oncology  Time spent: 24 minutes  [place]

## 2017-12-29 NOTE — Progress Notes (Signed)
[facility] Hematology/Oncology Progress Note  Date of admission: 12/27/2017  Hospital day:  12/29/2017  Chief Complaint: Edgar [name] is a 27 y.o. male with recurrent testicular cancer who was admitted through the medical oncology clinic for cycle #4 TIP chemotherapy.  Subjective: He denies any nausea or vomiting.  Eating well.  No change in left calf.  Social History: The patient is alone today.  Allergies: No Known Allergies  Scheduled Medications: . [START ON 12/30/2017] CISplatin  25 mg/m2 (Treatment Plan Recorded) Intravenous Once  . clobetasol ointment   Topical BID  . clonazePAM  0.5 mg Oral BID  . D5-1/2NS + KCL + MG +/- MANNITOL IV CISplatin hydration   Intravenous Once  . gabapentin  300 mg Oral QHS  . ifosfamide/mesna (IFEX/MESNEX) CHEMO IV infusion   Intravenous Once  . magnesium oxide  400 mg Oral Daily  . pantoprazole  40 mg Oral Daily  . potassium chloride  10 mEq Oral BID  . predniSONE  20 mg Oral Q breakfast  . rivaroxaban  20 mg Oral Daily    Review of Systems: GENERAL:  Feels "ok".  No fevers, sweats or weight loss. PERFORMANCE STATUS (ECOG):  1 HEENT:  No visual changes, runny nose, sore throat, mouth sores or tenderness. Lungs: No shortness of breath or cough.  No hemoptysis. Cardiac:  No chest pain, palpitations, orthopnea, or PND. GI:  No nausea, vomiting, diarrhea, constipation, melena or hematochezia. GU:  No urgency, frequency, dysuria, or hematuria. Musculoskeletal:  No back pain.  No joint pain.  No muscle tenderness. Extremities:  No pain or swelling. Skin:  No change in left calf lesion. No new lesions. Neuro:  No headache, numbness/tingling or weakness, balance or coordination issues. Endocrine:  No diabetes, thyroid issues, hot flashes or night sweats. Psych:  No mood changes, depression or anxiety. Pain:  Well controlled. Review of systems:  All other systems reviewed and found to be negative.  Physical  Exam: Blood pressure 118/82, pulse 63, temperature 98 F (36.7 C), temperature source Oral, resp. rate 14, height 6' 3"  (1.905 m), weight 255 lb 8.2 oz (115.9 kg), SpO2 100 %.  GENERAL:  Well developed, well nourished, young man lying comfortably on the medical unit in no acute distress. MENTAL STATUS:  Alert and oriented to person, place and time. HEAD:  Alopecia.  Normocephalic, atraumatic, face symmetric, no Cushingoid features. EYES:  Brown eyes.  Pupils equal round and reactive to light and accomodation.  No conjunctivitis or scleral icterus. ENT:  Oropharynx clear without lesion.  Tongue normal. Mucous membranes moist.  RESPIRATORY:  Clear to auscultation without rales, wheezes or rhonchi. CARDIOVASCULAR:  Regular rate and rhythm without murmur, rub or gallop. ABDOMEN:  Soft, non-tender, with active bowel sounds, and no hepatosplenomegaly.  No masses. SKIN:  Left calf unwrapped.  Lesion unchanged.  No erythema or induration.  EXTREMITIES: No edema, no skin discoloration or tenderness.  No palpable cords. NEUROLOGICAL: Unremarkable.  Finger to nose and RAM normal. PSYCH:  Appropriate.   Results for orders placed or performed during the hospital encounter of 12/27/17 (from the past 48 hour(s))  CBC with Differential     Status: Abnormal   Collection Time: 12/28/17  4:03 AM  Result Value Ref Range   WBC 7.4 4.0 - 10.5 K/uL   RBC 3.77 (L) 4.22 - 5.81 MIL/uL   Hemoglobin 12.1 (L) 13.0 - 17.0 g/dL   HCT 37.9 (L) 39.0 - 52.0 %   MCV 100.5 (H) 80.0 -  100.0 fL   MCH 32.1 26.0 - 34.0 pg   MCHC 31.9 30.0 - 36.0 g/dL   RDW 18.2 (H) 11.5 - 15.5 %   Platelets 355 150 - 400 K/uL   nRBC 0.0 0.0 - 0.2 %   Neutrophils Relative % 93 %   Neutro Abs 6.9 1.7 - 7.7 K/uL   Lymphocytes Relative 5 %   Lymphs Abs 0.4 (L) 0.7 - 4.0 K/uL   Monocytes Relative 1 %   Monocytes Absolute 0.1 0.1 - 1.0 K/uL   Eosinophils Relative 0 %   Eosinophils Absolute 0.0 0.0 - 0.5 K/uL   Basophils Relative 0 %    Basophils Absolute 0.0 0.0 - 0.1 K/uL   Immature Granulocytes 1 %   Abs Immature Granulocytes 0.04 0.00 - 0.07 K/uL    Comment: Performed at [facility], [address] [place], [place] [address]  Comprehensive metabolic panel     Status: Abnormal   Collection Time: 12/28/17  4:03 AM  Result Value Ref Range   Sodium 138 135 - 145 mmol/L   Potassium 4.6 3.5 - 5.1 mmol/L   Chloride 105 98 - 111 mmol/L   CO2 25 22 - 32 mmol/L   Glucose, Bld 155 (H) 70 - 99 mg/dL   BUN 17 6 - 20 mg/dL   Creatinine, Ser 0.66 0.61 - 1.24 mg/dL   Calcium 9.5 8.9 - 10.3 mg/dL   Total Protein 7.5 6.5 - 8.1 g/dL   Albumin 4.6 3.5 - 5.0 g/dL   AST 20 15 - 41 U/L   ALT 29 0 - 44 U/L   Alkaline Phosphatase 46 38 - 126 U/L   Total Bilirubin 0.8 0.3 - 1.2 mg/dL   GFR calc non Af Amer >60 >60 mL/min   GFR calc Af Amer >60 >60 mL/min    Comment: (NOTE) The eGFR has been calculated using the CKD EPI equation. This calculation has not been validated in all clinical situations. eGFR's persistently <60 mL/min signify possible Chronic Kidney Disease.    Anion gap 8 5 - 15    Comment: Performed at [facility], [address] [place], [place] [address]  Magnesium     Status: None   Collection Time: 12/28/17  4:03 AM  Result Value Ref Range   Magnesium 2.0 1.7 - 2.4 mg/dL    Comment: Performed at [facility] [address] [place], [place] [address]  Phosphorus     Status: None   Collection Time: 12/28/17  4:03 AM  Result Value Ref Range   Phosphorus 3.9 2.5 - 4.6 mg/dL    Comment: Performed at [facility], [address] [place], [place] [address]  Urinalysis, Complete w Microscopic     Status: Abnormal   Collection Time: 12/28/17  4:04 AM  Result Value Ref Range   Color, Urine YELLOW (A) YELLOW   APPearance CLEAR (A) CLEAR   Specific Gravity, Urine 1.025 1.005 - 1.030   pH 7.0 5.0 - 8.0   Glucose, UA NEGATIVE NEGATIVE mg/dL   Hgb urine dipstick NEGATIVE  NEGATIVE   Bilirubin Urine NEGATIVE NEGATIVE   Ketones, ur NEGATIVE NEGATIVE mg/dL   Protein, ur NEGATIVE NEGATIVE mg/dL   Nitrite NEGATIVE NEGATIVE   Leukocytes, UA NEGATIVE NEGATIVE   RBC / HPF 0-5 0 - 5 RBC/hpf   WBC, UA 6-10 0 - 5 WBC/hpf   Bacteria, UA NONE SEEN NONE SEEN   Squamous Epithelial / LPF 0-5 0 - 5  Mucus PRESENT     Comment: Performed at [facility], [address] [place], [place] [address]  CBC with Differential     Status: Abnormal   Collection Time: 12/29/17  6:19 AM  Result Value Ref Range   WBC 4.3 4.0 - 10.5 K/uL   RBC 3.59 (L) 4.22 - 5.81 MIL/uL   Hemoglobin 11.4 (L) 13.0 - 17.0 g/dL   HCT 35.5 (L) 39.0 - 52.0 %   MCV 98.9 80.0 - 100.0 fL   MCH 31.8 26.0 - 34.0 pg   MCHC 32.1 30.0 - 36.0 g/dL   RDW 17.1 (H) 11.5 - 15.5 %   Platelets 273 150 - 400 K/uL   nRBC 0.0 0.0 - 0.2 %   Neutrophils Relative % 93 %   Neutro Abs 4.1 1.7 - 7.7 K/uL   Lymphocytes Relative 4 %   Lymphs Abs 0.2 (L) 0.7 - 4.0 K/uL   Monocytes Relative 2 %   Monocytes Absolute 0.1 0.1 - 1.0 K/uL   Eosinophils Relative 0 %   Eosinophils Absolute 0.0 0.0 - 0.5 K/uL   Basophils Relative 0 %   Basophils Absolute 0.0 0.0 - 0.1 K/uL   Immature Granulocytes 1 %   Abs Immature Granulocytes 0.02 0.00 - 0.07 K/uL    Comment: Performed at [facility], [address] [place], [place] [address]  Comprehensive metabolic panel     Status: Abnormal   Collection Time: 12/29/17  6:19 AM  Result Value Ref Range   Sodium 136 135 - 145 mmol/L   Potassium 4.1 3.5 - 5.1 mmol/L   Chloride 104 98 - 111 mmol/L   CO2 26 22 - 32 mmol/L   Glucose, Bld 162 (H) 70 - 99 mg/dL   BUN 19 6 - 20 mg/dL   Creatinine, Ser 0.76 0.61 - 1.24 mg/dL   Calcium 9.0 8.9 - 10.3 mg/dL   Total Protein 6.6 6.5 - 8.1 g/dL   Albumin 3.8 3.5 - 5.0 g/dL   AST 17 15 - 41 U/L   ALT 23 0 - 44 U/L   Alkaline Phosphatase 41 38 - 126 U/L   Total Bilirubin 0.8 0.3 - 1.2 mg/dL   GFR calc non Af Amer >60 >60  mL/min   GFR calc Af Amer >60 >60 mL/min    Comment: (NOTE) The eGFR has been calculated using the CKD EPI equation. This calculation has not been validated in all clinical situations. eGFR's persistently <60 mL/min signify possible Chronic Kidney Disease.    Anion gap 6 5 - 15    Comment: Performed at [facility], [address] [place], [place] [address]  Magnesium     Status: Abnormal   Collection Time: 12/29/17  6:19 AM  Result Value Ref Range   Magnesium 2.5 (H) 1.7 - 2.4 mg/dL    Comment: Performed at [facility], [address] [place], [place] [address]  Phosphorus     Status: None   Collection Time: 12/29/17  6:19 AM  Result Value Ref Range   Phosphorus 3.5 2.5 - 4.6 mg/dL    Comment: Performed at [facility], [address] [place],  [address]  Urinalysis, Complete w Microscopic     Status: Abnormal   Collection Time: 12/29/17  6:22 AM  Result Value Ref Range   Color, Urine YELLOW (A) YELLOW   APPearance CLEAR (A) CLEAR   Specific Gravity, Urine 1.021 1.005 - 1.030   pH 5.0 5.0 - 8.0  Glucose, UA NEGATIVE NEGATIVE mg/dL   Hgb urine dipstick NEGATIVE NEGATIVE   Bilirubin Urine NEGATIVE NEGATIVE   Ketones, ur 80 (A) NEGATIVE mg/dL   Protein, ur NEGATIVE NEGATIVE mg/dL   Nitrite NEGATIVE NEGATIVE   Leukocytes, UA NEGATIVE NEGATIVE   RBC / HPF 0-5 0 - 5 RBC/hpf   WBC, UA 0-5 0 - 5 WBC/hpf   Bacteria, UA NONE SEEN NONE SEEN   Squamous Epithelial / LPF NONE SEEN 0 - 5   Mucus PRESENT     Comment: Performed at [facility], [address] [place], [place] [address]   No results found.  Assessment:  Edgar [name] is a 27 y.o. male with recurrent testicular cancer who was admitted for cycle #4 TIP chemotherapy.  He has a history of LLE DVT.  He is s/p IVC filterplacement, percutaneous angioplastyand stentplacementof the left common iliac vein and left external iliac vein on 10/13/2017. He is on  Xarelto.  He stepped on a nail on 11/01/2017.  Right foot MRI on 11/17/2017 revealed artifact in the plantar soft tissues over the first metatarsal head likely representing metallic foreign bodies. There was an area of increased T2 signal intensity in the plantar aspect of the first metatarsal head with focal enhancement may indicate an area of focal osteomyelitis. There was no soft tissue abscess.  He is followed by Dr. Cleda Mccreedy, podiatrist, without intervention.  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 He is on prednisone 20 mg daily.  He has electrolyte wasting (magnesium and potassium) secondary to cisplatin.  He is on supplementation.  Symptomatically, he denies any nausea or vomiting.  Left calf lesion "stings" (no change).  Plan:   1.   Oncology:  Cycle #4 TIP chemotherapy.    Paclitaxel 250 mg/m2 CI over 24 hours on Day 1- completed.  Mesna 500 mg/m2 over 15 minutes before ifosfamide, then at 4 hours and 8 hours from the start of each ifosfamide dose on day 2-5.  Ifosfamide 1500 mg/m2 over 1 hour daily on days 2-5.  Cisplatin 25 mg/m2 over 60 minutes on days 2-5.  Hydration pre and post ifosfamide and cisplatin.  Premed for Taxol: Famotidine, Benadryl, Decadron  Daily lab check: CBC with diff, CMP, Mg, phosphorus, urinalysis.  Neuro checks daily.  Anticipate daily GCSF in outpatient department post chemotherapy  Patient received day 2 chemotherapy early this morning.  He denies any nausea or vomiting.  2.   Hematology:  Left lower extremity DVT s/p stent placement.  Continue Xarelto.  Counts adequate.  CBC daily.  3.   Dermatology:  Sweet's syndome on prednisone 20 mg a day.  Topical clobetasol.  Wound stable.  Continue wound care to left calf.  4.   Fluids, Electrolyes and Nutrition:  Magnesium slightly elevated.  Hold magnesium today.  Follow labs as above daily.  Ensure total daily fluid 2-3 liters.  Urinalysis unremarkable.  Check UA daily to  assure no hemorrhagic cystitis.  5.   Disposition:  Expect discharge home after chemotherapy complete late Saturday/early Sunday if tolerating well.  Will begin daily GCSF on Monday, 01/03/2018.   [name], MD  12/29/2017, 11:24 AM

## 2017-12-29 NOTE — Telephone Encounter (Signed)
Erroneous entry

## 2017-12-30 ENCOUNTER — Other Ambulatory Visit: Payer: Self-pay | Admitting: Hematology and Oncology

## 2017-12-30 DIAGNOSIS — Z79899 Other long term (current) drug therapy: Secondary | ICD-10-CM

## 2017-12-30 DIAGNOSIS — Z7952 Long term (current) use of systemic steroids: Secondary | ICD-10-CM

## 2017-12-30 LAB — URINALYSIS, COMPLETE (UACMP) WITH MICROSCOPIC
Bacteria, UA: NONE SEEN
Bilirubin Urine: NEGATIVE
Glucose, UA: NEGATIVE mg/dL
Hgb urine dipstick: NEGATIVE
Ketones, ur: 80 mg/dL — AB
Leukocytes, UA: NEGATIVE
Nitrite: NEGATIVE
Protein, ur: NEGATIVE mg/dL
Specific Gravity, Urine: 1.02 (ref 1.005–1.030)
Squamous Epithelial / LPF: NONE SEEN (ref 0–5)
WBC, UA: NONE SEEN WBC/hpf (ref 0–5)
pH: 5 (ref 5.0–8.0)

## 2017-12-30 LAB — CBC WITH DIFFERENTIAL/PLATELET
Abs Immature Granulocytes: 0.03 10*3/uL (ref 0.00–0.07)
Basophils Absolute: 0 10*3/uL (ref 0.0–0.1)
Basophils Relative: 0 %
Eosinophils Absolute: 0 10*3/uL (ref 0.0–0.5)
Eosinophils Relative: 0 %
HCT: 33.5 % — ABNORMAL LOW (ref 39.0–52.0)
Hemoglobin: 10.5 g/dL — ABNORMAL LOW (ref 13.0–17.0)
Immature Granulocytes: 1 %
Lymphocytes Relative: 3 %
Lymphs Abs: 0.2 10*3/uL — ABNORMAL LOW (ref 0.7–4.0)
MCH: 31.5 pg (ref 26.0–34.0)
MCHC: 31.3 g/dL (ref 30.0–36.0)
MCV: 100.6 fL — ABNORMAL HIGH (ref 80.0–100.0)
Monocytes Absolute: 0.1 10*3/uL (ref 0.1–1.0)
Monocytes Relative: 3 %
Neutro Abs: 4.7 10*3/uL (ref 1.7–7.7)
Neutrophils Relative %: 93 %
Platelets: 230 10*3/uL (ref 150–400)
RBC: 3.33 MIL/uL — ABNORMAL LOW (ref 4.22–5.81)
RDW: 16.3 % — ABNORMAL HIGH (ref 11.5–15.5)
WBC: 5 10*3/uL (ref 4.0–10.5)
nRBC: 0 % (ref 0.0–0.2)

## 2017-12-30 LAB — COMPREHENSIVE METABOLIC PANEL
ALT: 18 U/L (ref 0–44)
AST: 13 U/L — ABNORMAL LOW (ref 15–41)
Albumin: 3.8 g/dL (ref 3.5–5.0)
Alkaline Phosphatase: 40 U/L (ref 38–126)
Anion gap: 4 — ABNORMAL LOW (ref 5–15)
BUN: 19 mg/dL (ref 6–20)
CO2: 27 mmol/L (ref 22–32)
Calcium: 9.1 mg/dL (ref 8.9–10.3)
Chloride: 105 mmol/L (ref 98–111)
Creatinine, Ser: 0.61 mg/dL (ref 0.61–1.24)
GFR calc Af Amer: >60 mL/min (ref >60)
GFR calc non Af Amer: >60 mL/min (ref >60)
Glucose, Bld: 119 mg/dL — ABNORMAL HIGH (ref 70–99)
Potassium: 4.4 mmol/L (ref 3.5–5.1)
Sodium: 136 mmol/L (ref 135–145)
Total Bilirubin: 0.8 mg/dL (ref 0.3–1.2)
Total Protein: 6.5 g/dL (ref 6.5–8.1)

## 2017-12-30 LAB — PHOSPHORUS: Phosphorus: 4.1 mg/dL (ref 2.5–4.6)

## 2017-12-30 LAB — MAGNESIUM: Magnesium: 2.6 mg/dL — ABNORMAL HIGH (ref 1.7–2.4)

## 2017-12-30 MED ORDER — PALONOSETRON HCL INJECTION 0.25 MG/5ML
0.2500 mg | Freq: Once | INTRAVENOUS | Status: AC
  Administered 2017-12-30: 22:00:00 0.25 mg via INTRAVENOUS
  Filled 2017-12-30: qty 5

## 2017-12-30 MED ORDER — SODIUM CHLORIDE 0.9 % IV SOLN
20.0000 mg | Freq: Once | INTRAVENOUS | Status: AC
  Administered 2017-12-30: 20 mg via INTRAVENOUS
  Filled 2017-12-30: qty 2

## 2017-12-30 MED ORDER — SODIUM CHLORIDE 0.9 % IV SOLN
500.0000 mg/m2 | Freq: Once | INTRAVENOUS | Status: AC
  Administered 2017-12-30: 23:00:00 1250 mg via INTRAVENOUS
  Filled 2017-12-30: qty 12.5

## 2017-12-30 MED ORDER — POTASSIUM CHLORIDE 2 MEQ/ML IV SOLN
Freq: Once | INTRAVENOUS | Status: AC
  Administered 2017-12-30: 20:00:00 via INTRAVENOUS
  Filled 2017-12-30: qty 10

## 2017-12-30 MED ORDER — SODIUM CHLORIDE 0.9 % IV SOLN
Freq: Once | INTRAVENOUS | Status: AC
  Administered 2017-12-30: via INTRAVENOUS
  Filled 2017-12-30: qty 76

## 2017-12-30 MED ORDER — SODIUM CHLORIDE 0.9 % IV SOLN
25.0000 mg/m2 | Freq: Once | INTRAVENOUS | Status: AC
  Administered 2017-12-31: 01:00:00 64 mg via INTRAVENOUS
  Filled 2017-12-30: qty 64

## 2017-12-30 MED ORDER — SODIUM CHLORIDE 0.9 % IV SOLN
500.0000 mg/m2 | INTRAVENOUS | Status: AC
  Administered 2017-12-31 (×2): 1250 mg via INTRAVENOUS
  Filled 2017-12-30 (×2): qty 12.5

## 2017-12-30 NOTE — Progress Notes (Signed)
[facility] Hematology/Oncology Progress Note  Date of admission: 12/27/2017  Hospital day:  12/30/2017  Chief Complaint: Edgar [name] is a 27 y.o. male with recurrent testicular cancer who was admitted through the medical oncology clinic for cycle #4 TIP chemotherapy.  Subjective:  Patient denies any nausea or vomiting.  Eating well.  Cramps in legs associated with chemotherapy.  Stinging in left calf (no change).  Social History: The patient is accompanied by his girlfriend, another woman, and his daughter today.  Allergies: No Known Allergies  Scheduled Medications: . [START ON 12/31/2017] CISplatin  25 mg/m2 (Treatment Plan Recorded) Intravenous Once  . clobetasol ointment   Topical BID  . D5-1/2NS + KCL + MG +/- MANNITOL IV CISplatin hydration   Intravenous Once  . gabapentin  300 mg Oral QHS  . ifosfamide/mesna (IFEX/MESNEX) CHEMO IV infusion   Intravenous Once  . palonosetron  0.25 mg Intravenous Once  . pantoprazole  40 mg Oral Daily  . potassium chloride  10 mEq Oral BID  . predniSONE  20 mg Oral Q breakfast  . rivaroxaban  20 mg Oral Daily    Review of Systems: GENERAL:  Feels "ok".  No fevers or sweats. PERFORMANCE STATUS (ECOG):  1 HEENT:  No visual changes, runny nose, sore throat, mouth sores or tenderness. Lungs: No shortness of breath or cough.  No hemoptysis. Cardiac:  No chest pain, palpitations, orthopnea, or PND. GI:  No nausea, vomiting, diarrhea, constipation, melena or hematochezia. GU:  No urgency, frequency, dysuria, or hematuria. Musculoskeletal:  No back pain.  No joint pain.  No muscle tenderness. Extremities:  Mild cramping.  No swelling. Skin:  No new skin lesions.  Left calk lesion unchanged. Neuro:  Little tingling in toes.  No neuropathy in hands.  No headache, focal weakness, balance or coordination issues. Endocrine:  No diabetes, thyroid issues, hot flashes or night sweats. Psych:  No mood changes, depression or  anxiety. Pain:  Well controlled. Review of systems:  All other systems reviewed and found to be negative.   Physical Exam: Blood pressure (!) 130/104, pulse 80, temperature 97.6 F (36.4 C), temperature source Oral, resp. rate 16, height 6' 3" (1.905 m), weight 255 lb 8.2 oz (115.9 kg), SpO2 100 %.  GENERAL:  Well developed, well nourished, young man sitting comfortably in the exam room in no acute distress. MENTAL STATUS:  Alert and oriented to person, place and time. HEAD:  Alopecia.  Normocephalic, atraumatic, face symmetric, no Cushingoid features. EYES:  Brown eyes.  Pupils equal round and reactive to light and accomodation.  No conjunctivitis or scleral icterus. ENT:  Oropharynx clear without lesion.  Tongue normal. Mucous membranes moist.  RESPIRATORY:  Clear to auscultation without rales, wheezes or rhonchi. CARDIOVASCULAR:  Regular rate and rhythm without murmur, rub or gallop. ABDOMEN:  Soft, non-tender, with active bowel sounds, and no hepatosplenomegaly.  No masses. SKIN:  Left calf unwrapped.  Moist 5 cm oval lesion.  No new lesions. EXTREMITIES: No edema, no skin discoloration or tenderness.  No palpable cords. NEUROLOGICAL: Unremarkable.  Finger to nose and RAM normal. PSYCH:  Appropriate.    Results for orders placed or performed during the hospital encounter of 12/27/17 (from the past 48 hour(s))  CBC with Differential     Status: Abnormal   Collection Time: 12/29/17  6:19 AM  Result Value Ref Range   WBC 4.3 4.0 - 10.5 K/uL   RBC 3.59 (L) 4.22 - 5.81 MIL/uL   Hemoglobin 11.4 (L)   13.0 - 17.0 g/dL   HCT 35.5 (L) 39.0 - 52.0 %   MCV 98.9 80.0 - 100.0 fL   MCH 31.8 26.0 - 34.0 pg   MCHC 32.1 30.0 - 36.0 g/dL   RDW 17.1 (H) 11.5 - 15.5 %   Platelets 273 150 - 400 K/uL   nRBC 0.0 0.0 - 0.2 %   Neutrophils Relative % 93 %   Neutro Abs 4.1 1.7 - 7.7 K/uL   Lymphocytes Relative 4 %   Lymphs Abs 0.2 (L) 0.7 - 4.0 K/uL   Monocytes Relative 2 %   Monocytes Absolute 0.1  0.1 - 1.0 K/uL   Eosinophils Relative 0 %   Eosinophils Absolute 0.0 0.0 - 0.5 K/uL   Basophils Relative 0 %   Basophils Absolute 0.0 0.0 - 0.1 K/uL   Immature Granulocytes 1 %   Abs Immature Granulocytes 0.02 0.00 - 0.07 K/uL    Comment: Performed at [facility], [address] [place], [place] [address]  Comprehensive metabolic panel     Status: Abnormal   Collection Time: 12/29/17  6:19 AM  Result Value Ref Range   Sodium 136 135 - 145 mmol/L   Potassium 4.1 3.5 - 5.1 mmol/L   Chloride 104 98 - 111 mmol/L   CO2 26 22 - 32 mmol/L   Glucose, Bld 162 (H) 70 - 99 mg/dL   BUN 19 6 - 20 mg/dL   Creatinine, Ser 0.76 0.61 - 1.24 mg/dL   Calcium 9.0 8.9 - 10.3 mg/dL   Total Protein 6.6 6.5 - 8.1 g/dL   Albumin 3.8 3.5 - 5.0 g/dL   AST 17 15 - 41 U/L   ALT 23 0 - 44 U/L   Alkaline Phosphatase 41 38 - 126 U/L   Total Bilirubin 0.8 0.3 - 1.2 mg/dL   GFR calc non Af Amer >60 >60 mL/min   GFR calc Af Amer >60 >60 mL/min    Comment: (NOTE) The eGFR has been calculated using the CKD EPI equation. This calculation has not been validated in all clinical situations. eGFR's persistently <60 mL/min signify possible Chronic Kidney Disease.    Anion gap 6 5 - 15    Comment: Performed at [facility], [address] [place], [place] [address]  Magnesium     Status: Abnormal   Collection Time: 12/29/17  6:19 AM  Result Value Ref Range   Magnesium 2.5 (H) 1.7 - 2.4 mg/dL    Comment: Performed at [facility], [address] [place], [place] [address]  Phosphorus     Status: None   Collection Time: 12/29/17  6:19 AM  Result Value Ref Range   Phosphorus 3.5 2.5 - 4.6 mg/dL    Comment: Performed at [facility], [address] [place], [place] [address]  Urinalysis, Complete w Microscopic     Status: Abnormal   Collection Time: 12/29/17  6:22 AM  Result Value Ref Range   Color, Urine YELLOW (A) YELLOW   APPearance CLEAR (A) CLEAR   Specific  Gravity, Urine 1.021 1.005 - 1.030   pH 5.0 5.0 - 8.0   Glucose, UA NEGATIVE NEGATIVE mg/dL   Hgb urine dipstick NEGATIVE NEGATIVE   Bilirubin Urine NEGATIVE NEGATIVE   Ketones, ur 80 (A) NEGATIVE mg/dL   Protein, ur NEGATIVE NEGATIVE mg/dL   Nitrite NEGATIVE NEGATIVE   Leukocytes, UA NEGATIVE NEGATIVE   RBC / HPF 0-5 0 - 5 RBC/hpf   WBC, UA 0-5 0 - 5  WBC/hpf   Bacteria, UA NONE SEEN NONE SEEN   Squamous Epithelial / LPF NONE SEEN 0 - 5   Mucus PRESENT     Comment: Performed at [facility], [address] [place], [place] [address]  CBC with Differential     Status: Abnormal   Collection Time: 12/30/17  5:59 AM  Result Value Ref Range   WBC 5.0 4.0 - 10.5 K/uL   RBC 3.33 (L) 4.22 - 5.81 MIL/uL   Hemoglobin 10.5 (L) 13.0 - 17.0 g/dL   HCT 33.5 (L) 39.0 - 52.0 %   MCV 100.6 (H) 80.0 - 100.0 fL   MCH 31.5 26.0 - 34.0 pg   MCHC 31.3 30.0 - 36.0 g/dL   RDW 16.3 (H) 11.5 - 15.5 %   Platelets 230 150 - 400 K/uL   nRBC 0.0 0.0 - 0.2 %   Neutrophils Relative % 93 %   Neutro Abs 4.7 1.7 - 7.7 K/uL   Lymphocytes Relative 3 %   Lymphs Abs 0.2 (L) 0.7 - 4.0 K/uL   Monocytes Relative 3 %   Monocytes Absolute 0.1 0.1 - 1.0 K/uL   Eosinophils Relative 0 %   Eosinophils Absolute 0.0 0.0 - 0.5 K/uL   Basophils Relative 0 %   Basophils Absolute 0.0 0.0 - 0.1 K/uL   Immature Granulocytes 1 %   Abs Immature Granulocytes 0.03 0.00 - 0.07 K/uL    Comment: Performed at [facility], [address] [place], [place] [address]  Comprehensive metabolic panel     Status: Abnormal   Collection Time: 12/30/17  5:59 AM  Result Value Ref Range   Sodium 136 135 - 145 mmol/L   Potassium 4.4 3.5 - 5.1 mmol/L   Chloride 105 98 - 111 mmol/L   CO2 27 22 - 32 mmol/L   Glucose, Bld 119 (H) 70 - 99 mg/dL   BUN 19 6 - 20 mg/dL   Creatinine, Ser 0.61 0.61 - 1.24 mg/dL   Calcium 9.1 8.9 - 10.3 mg/dL   Total Protein 6.5 6.5 - 8.1 g/dL   Albumin 3.8 3.5 - 5.0 g/dL   AST 13 (L) 15 - 41  U/L   ALT 18 0 - 44 U/L   Alkaline Phosphatase 40 38 - 126 U/L   Total Bilirubin 0.8 0.3 - 1.2 mg/dL   GFR calc non Af Amer >60 >60 mL/min   GFR calc Af Amer >60 >60 mL/min    Comment: (NOTE) The eGFR has been calculated using the CKD EPI equation. This calculation has not been validated in all clinical situations. eGFR's persistently <60 mL/min signify possible Chronic Kidney Disease.    Anion gap 4 (L) 5 - 15    Comment: Performed at [facility], [address] [place], [place] [address]  Magnesium     Status: Abnormal   Collection Time: 12/30/17  5:59 AM  Result Value Ref Range   Magnesium 2.6 (H) 1.7 - 2.4 mg/dL    Comment: Performed at [facility], [address] [place], [place] [address]  Phosphorus     Status: None   Collection Time: 12/30/17  5:59 AM  Result Value Ref Range   Phosphorus 4.1 2.5 - 4.6 mg/dL    Comment: Performed at [facility], [address] [place], [place] [address]  Urinalysis, Complete w Microscopic     Status: Abnormal   Collection Time: 12/30/17  5:59 AM  Result Value Ref Range   Color, Urine YELLOW (  A) YELLOW   APPearance CLEAR (A) CLEAR   Specific Gravity, Urine 1.020 1.005 - 1.030   pH 5.0 5.0 - 8.0   Glucose, UA NEGATIVE NEGATIVE mg/dL   Hgb urine dipstick NEGATIVE NEGATIVE   Bilirubin Urine NEGATIVE NEGATIVE   Ketones, ur 80 (A) NEGATIVE mg/dL   Protein, ur NEGATIVE NEGATIVE mg/dL   Nitrite NEGATIVE NEGATIVE   Leukocytes, UA NEGATIVE NEGATIVE   WBC, UA NONE SEEN 0 - 5 WBC/hpf   Bacteria, UA NONE SEEN NONE SEEN   Squamous Epithelial / LPF NONE SEEN 0 - 5   Mucus PRESENT     Comment: Performed at [facility], [address] [place], [place] [address]   No results found.  Assessment:  Edgar [name] is a 27 y.o. male with recurrent testicular cancer who was admitted for cycle #4 TIP chemotherapy.  He has a history of LLE DVT.  He is s/p IVC filterplacement, percutaneous  angioplastyand stentplacementof the left common iliac vein and left external iliac vein on 10/13/2017. He is on Xarelto.  He stepped on a nail on 11/01/2017.  Right foot MRI on 11/17/2017 revealed artifact in the plantar soft tissues over the first metatarsal head likely representing metallic foreign bodies. There was an area of increased T2 signal intensity in the plantar aspect of the first metatarsal head with focal enhancement may indicate an area of focal osteomyelitis. There was no soft tissue abscess.  He is followed by Dr. Cleda Mccreedy, podiatrist, without intervention.  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 He is on prednisone 20 mg daily.  He has electrolyte wasting (magnesium and potassium) secondary to cisplatin.  He is on supplementation.  Symptomatically, he denies any new symptoms.  Calf lesion is stable.  Plan:   1.   Oncology:  Cycle #4 TIP chemotherapy.    Paclitaxel 250 mg/m2 CI over 24 hours on Day 1- completed.  Mesna 500 mg/m2 over 15 minutes before ifosfamide, then at 4 hours and 8 hours from the start of each ifosfamide dose on day 2-5.  Ifosfamide 1500 mg/m2 over 1 hour daily on days 2-5.  Cisplatin 25 mg/m2 over 60 minutes on days 2-5.  Hydration pre and post ifosfamide and cisplatin.  Premed for Taxol: Famotidine, Benadryl, Decadron  Daily lab check: CBC with diff, CMP, Mg, phosphorus, urinalysis.  Neuro checks daily.  Anticipate daily GCSF in outpatient department post chemotherapy  Patient received day 3 chemotherapy early this morning.  He denies any nausea or vomiting.  2.   Hematology:  Left lower extremity DVT s/p stent placement.  Continue Xarelto.  Counts adequate.  CBC daily.  3.   Dermatology:  Sweet's syndome on prednisone 20 mg a day.  Topical clobetasol.  Wound stable.  Per Dr. Marolyn Hammock, [facility] dermatologist, keep wound covered and no debriding.  He has appointment on 01/05/2018 at [facility].  4.   Fluids, Electrolyes and  Nutrition:  Magnesium slightly elevated. Magnesium continues to be held.  Follow labs as above daily.  Ensure total daily fluid 2-3 liters.  Encouraged fluids today.  Urinalysis unremarkable.  UA daily to assure no hemorrhagic cystitis.  5.   Disposition:  Expect discharge home after chemotherapy complete early Sunday if tolerating well.  Will begin daily GCSF on Monday, 01/03/2018.   [name], MD  12/30/2017, 7:22 PM

## 2017-12-30 NOTE — Progress Notes (Signed)
Patient ID: Edgar [name], male   DOB: 26-Sep-1990, 27 y.o.   MRN: [id number]  [facility] PROGRESS NOTE  Edgar [name] DOB: May 06, 1990 DOA: 12/27/2017 PCP: Edgar Asal, MD  HPI/Subjective: Patient's major complaint is he is tired because a from the chemo all night.  Tolerating the chemo okay.  No other complaints.  No new skin lesions.  Objective: Vitals:   12/30/17 0509 12/30/17 1453  BP: (!) 142/95 (!) 130/104  Pulse: 73 80  Resp: 16   Temp: (!) 97.5 F (36.4 C) 97.6 F (36.4 C)  SpO2: 100% 100%    Filed Weights   12/27/17 1644  Weight: 115.9 kg    ROS: Review of Systems  Constitutional: Positive for malaise/fatigue. Negative for chills and fever.  Eyes: Negative for blurred vision.  Respiratory: Negative for cough and shortness of breath.   Cardiovascular: Negative for chest pain.  Gastrointestinal: Negative for abdominal pain, constipation, diarrhea, nausea and vomiting.  Genitourinary: Negative for dysuria.  Musculoskeletal: Negative for joint pain.  Neurological: Negative for dizziness and headaches.   Exam: Physical Exam  Constitutional: He is oriented to person, place, and time.  HENT:  Nose: No mucosal edema.  Mouth/Throat: No oropharyngeal exudate or posterior oropharyngeal edema.  Eyes: Pupils are equal, round, and reactive to light. Conjunctivae, EOM and lids are normal.  Neck: No JVD present. Carotid bruit is not present. No edema present. No thyroid mass and no thyromegaly present.  Cardiovascular: S1 normal and S2 normal. Exam reveals no gallop.  No murmur heard. Pulses:      Dorsalis pedis pulses are 2+ on the right side, and 2+ on the left side.  Respiratory: No respiratory distress. He has no wheezes. He has no rhonchi. He has no rales.  GI: Soft. Bowel sounds are normal. There is no tenderness.  Musculoskeletal:       Right ankle: He exhibits no swelling.       Left ankle: He exhibits no swelling.   Lymphadenopathy:    He has no cervical adenopathy.  Neurological: He is alert and oriented to person, place, and time. No cranial nerve deficit.  Skin: Skin is warm. Nails show no clubbing.  Left calf active ulceration around 6 x 6 cm.  Left thigh numerous older skin lesions that are dried up but not totally healed.  Psychiatric: He has a normal mood and affect.      Data Reviewed: Basic Metabolic Panel: Recent Labs  Lab 12/27/17 1440 12/28/17 0403 12/29/17 0619 12/30/17 0559  NA 139 138 136 136  K 4.3 4.6 4.1 4.4  CL 103 105 104 105  CO2 24 25 26 27   GLUCOSE 167* 155* 162* 119*  BUN 16 17 19 19   CREATININE 0.97 0.66 0.76 0.61  CALCIUM 10.5* 9.5 9.0 9.1  MG 2.1 2.0 2.5* 2.6*  PHOS  --  3.9 3.5 4.1   Liver Function Tests: Recent Labs  Lab 12/27/17 1440 12/28/17 0403 12/29/17 0619 12/30/17 0559  AST 26 20 17  13*  ALT 35 29 23 18   ALKPHOS 55 46 41 40  BILITOT 0.9 0.8 0.8 0.8  PROT 8.1 7.5 6.6 6.5  ALBUMIN 5.2* 4.6 3.8 3.8   CBC: Recent Labs  Lab 12/27/17 1440 12/28/17 0403 12/29/17 0619 12/30/17 0559  WBC 11.3* 7.4 4.3 5.0  NEUTROABS 10.0* 6.9 4.1 4.7  HGB 12.8* 12.1* 11.4* 10.5*  HCT 40.6 37.9* 35.5* 33.5*  MCV 99.5 100.5* 98.9 100.6*  PLT 392 355 273 230  Scheduled Meds: . [START ON 12/31/2017] CISplatin  25 mg/m2 (Treatment Plan Recorded) Intravenous Once  . clobetasol ointment   Topical BID  . D5-1/2NS + KCL + MG +/- MANNITOL IV CISplatin hydration   Intravenous Once  . gabapentin  300 mg Oral QHS  . ifosfamide/mesna (IFEX/MESNEX) CHEMO IV infusion   Intravenous Once  . palonosetron  0.25 mg Intravenous Once  . pantoprazole  40 mg Oral Daily  . potassium chloride  10 mEq Oral BID  . predniSONE  20 mg Oral Q breakfast  . rivaroxaban  20 mg Oral Daily   Continuous Infusions: . sodium chloride 10 mL/hr at 12/30/17 0601  . sodium chloride    . dexamethasone (DECADRON) IVPB [facility]    . famotidine    . mesna    . [START ON 12/31/2017] mesna       Assessment/Plan:  1. Recurrent testicular cancer.  Chemotherapy as per oncology.  Likely will finish up and be discharged on Saturday morning if everything is goes well. 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  Currently on 20 mg of prednisone 3. Left lower extremity DVT on Xarelto 4. Chronic pain on oxycodone 5. 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 GERD on PPI  Code Status:     Code Status Orders  (From admission, onward)         Start     Ordered   12/27/17 1707  Full code  Continuous     12/27/17 1706        Code Status History    Date Active Date Inactive Code Status Order ID Comments User Context   12/07/2017 1801 12/11/2017 1509 Full Code [id number]  [name], MD Inpatient   11/22/2017 1123 11/27/2017 1438 Full Code [id number]  Saundra Shelling, MD Inpatient   11/05/2017 1442 11/09/2017 2046 Full Code [id number]  Gorden Harms, MD Inpatient   10/21/2017 1110 10/26/2017 1635 Full Code [id number]  Vaughan Basta, MD Inpatient   10/08/2017 1621 10/16/2017 1836 Full Code [id number]  Vaughan Basta, MD Inpatient      Disposition Plan: We will go home after chemotherapy on Saturday  Consultants:  Oncology  Time spent: 23 minutes  [place]

## 2017-12-31 ENCOUNTER — Other Ambulatory Visit: Payer: Self-pay | Admitting: Hematology and Oncology

## 2017-12-31 ENCOUNTER — Encounter: Payer: Self-pay | Admitting: Urgent Care

## 2017-12-31 ENCOUNTER — Encounter: Payer: Self-pay | Admitting: Hematology and Oncology

## 2017-12-31 ENCOUNTER — Other Ambulatory Visit: Payer: Self-pay | Admitting: *Deleted

## 2017-12-31 DIAGNOSIS — C6210 Malignant neoplasm of unspecified descended testis: Secondary | ICD-10-CM

## 2017-12-31 LAB — CBC WITH DIFFERENTIAL/PLATELET
Abs Immature Granulocytes: 0.02 10*3/uL (ref 0.00–0.07)
Basophils Absolute: 0 10*3/uL (ref 0.0–0.1)
Basophils Relative: 0 %
Eosinophils Absolute: 0 10*3/uL (ref 0.0–0.5)
Eosinophils Relative: 0 %
HCT: 33.1 % — ABNORMAL LOW (ref 39.0–52.0)
Hemoglobin: 10.6 g/dL — ABNORMAL LOW (ref 13.0–17.0)
Immature Granulocytes: 0 %
Lymphocytes Relative: 3 %
Lymphs Abs: 0.2 10*3/uL — ABNORMAL LOW (ref 0.7–4.0)
MCH: 32.1 pg (ref 26.0–34.0)
MCHC: 32 g/dL (ref 30.0–36.0)
MCV: 100.3 fL — ABNORMAL HIGH (ref 80.0–100.0)
Monocytes Absolute: 0 10*3/uL — ABNORMAL LOW (ref 0.1–1.0)
Monocytes Relative: 1 %
Neutro Abs: 5.8 10*3/uL (ref 1.7–7.7)
Neutrophils Relative %: 96 %
Platelets: 195 10*3/uL (ref 150–400)
RBC: 3.3 MIL/uL — ABNORMAL LOW (ref 4.22–5.81)
RDW: 16.3 % — ABNORMAL HIGH (ref 11.5–15.5)
WBC: 6 10*3/uL (ref 4.0–10.5)
nRBC: 0 % (ref 0.0–0.2)

## 2017-12-31 LAB — URINALYSIS, COMPLETE (UACMP) WITH MICROSCOPIC
Bacteria, UA: NONE SEEN
Bilirubin Urine: NEGATIVE
Glucose, UA: 50 mg/dL — AB
Ketones, ur: 80 mg/dL — AB
Leukocytes, UA: NEGATIVE
Nitrite: NEGATIVE
Protein, ur: NEGATIVE mg/dL
Specific Gravity, Urine: 1.016 (ref 1.005–1.030)
pH: 6 (ref 5.0–8.0)

## 2017-12-31 LAB — COMPREHENSIVE METABOLIC PANEL
ALT: 20 U/L (ref 0–44)
AST: 16 U/L (ref 15–41)
Albumin: 3.7 g/dL (ref 3.5–5.0)
Alkaline Phosphatase: 39 U/L (ref 38–126)
Anion gap: 6 (ref 5–15)
BUN: 19 mg/dL (ref 6–20)
CO2: 28 mmol/L (ref 22–32)
Calcium: 9.1 mg/dL (ref 8.9–10.3)
Chloride: 103 mmol/L (ref 98–111)
Creatinine, Ser: 0.86 mg/dL (ref 0.61–1.24)
GFR calc Af Amer: >60 mL/min (ref >60)
GFR calc non Af Amer: >60 mL/min (ref >60)
Glucose, Bld: 146 mg/dL — ABNORMAL HIGH (ref 70–99)
Potassium: 4.6 mmol/L (ref 3.5–5.1)
Sodium: 137 mmol/L (ref 135–145)
Total Bilirubin: 0.8 mg/dL (ref 0.3–1.2)
Total Protein: 6.3 g/dL — ABNORMAL LOW (ref 6.5–8.1)

## 2017-12-31 LAB — PHOSPHORUS: Phosphorus: 3.4 mg/dL (ref 2.5–4.6)

## 2017-12-31 LAB — MAGNESIUM: Magnesium: 2.4 mg/dL (ref 1.7–2.4)

## 2017-12-31 MED ORDER — SODIUM CHLORIDE 0.9 % IV SOLN
500.0000 mg/m2 | INTRAVENOUS | Status: AC
  Administered 2018-01-01 (×2): 1250 mg via INTRAVENOUS
  Filled 2017-12-31 (×2): qty 12.5

## 2017-12-31 MED ORDER — POTASSIUM CHLORIDE 2 MEQ/ML IV SOLN
Freq: Once | INTRAVENOUS | Status: AC
  Administered 2017-12-31: 18:00:00 via INTRAVENOUS
  Filled 2017-12-31: qty 10

## 2017-12-31 MED ORDER — SODIUM CHLORIDE 0.9 % IV SOLN
20.0000 mg | Freq: Once | INTRAVENOUS | Status: AC
  Administered 2017-12-31: 21:00:00 20 mg via INTRAVENOUS
  Filled 2017-12-31: qty 2

## 2017-12-31 MED ORDER — MORPHINE SULFATE (PF) 2 MG/ML IV SOLN
2.0000 mg | INTRAVENOUS | Status: DC | PRN
  Administered 2017-12-31 – 2018-01-01 (×8): 2 mg via INTRAVENOUS
  Filled 2017-12-31 (×8): qty 1

## 2017-12-31 MED ORDER — CLONAZEPAM 0.5 MG PO TBDP: 0.5000 mg | ORAL_TABLET | Freq: Two times a day (BID) | ORAL | 0 refills | Status: DC | PRN

## 2017-12-31 MED ORDER — MORPHINE SULFATE (PF) 2 MG/ML IV SOLN
2.0000 mg | INTRAVENOUS | Status: DC | PRN
  Administered 2017-12-31: 02:00:00 2 mg via INTRAVENOUS
  Filled 2017-12-31: qty 1

## 2017-12-31 MED ORDER — SODIUM CHLORIDE 0.9 % IV SOLN
Freq: Once | INTRAVENOUS | Status: AC
  Administered 2017-12-31: 22:00:00 via INTRAVENOUS
  Filled 2017-12-31: qty 76

## 2017-12-31 MED ORDER — SODIUM CHLORIDE 0.9 % IV SOLN
500.0000 mg/m2 | Freq: Once | INTRAVENOUS | Status: AC
  Administered 2017-12-31: 21:00:00 1250 mg via INTRAVENOUS
  Filled 2017-12-31: qty 12.5

## 2017-12-31 MED ORDER — SODIUM CHLORIDE 0.9 % IV SOLN
25.0000 mg/m2 | Freq: Once | INTRAVENOUS | Status: AC
  Administered 2017-12-31: 23:00:00 64 mg via INTRAVENOUS
  Filled 2017-12-31: qty 64

## 2017-12-31 NOTE — Progress Notes (Signed)
Patient ID: Edgar [name], male   DOB: 05-Jul-1990, 27 y.o.   MRN: [id number]  [facility] PROGRESS NOTE  Edgar [name] [id number]:[id number] DOB: 1990-09-01 DOA: 12/27/2017 PCP: [name], MD  HPI/Subjective: No new skin lesions.  Feels okay.  Tired from having the chemo overnight.  No nausea vomiting or diarrhea.  Objective: Vitals:   12/30/17 1931 12/31/17 0426  BP: (!) 135/93 126/85  Pulse: 63 66  Resp: 16 16  Temp: 98.2 F (36.8 C) 97.8 F (36.6 C)  SpO2: 100% 99%    Filed Weights   12/27/17 1644  Weight: 115.9 kg    ROS: Review of Systems  Constitutional: Positive for malaise/fatigue. Negative for chills and fever.  Eyes: Negative for blurred vision.  Respiratory: Negative for cough and shortness of breath.   Cardiovascular: Negative for chest pain.  Gastrointestinal: Negative for abdominal pain, constipation, diarrhea, nausea and vomiting.  Genitourinary: Negative for dysuria.  Musculoskeletal: Negative for joint pain.  Neurological: Negative for dizziness and headaches.   Exam: Physical Exam  Constitutional: He is oriented to person, place, and time.  HENT:  Nose: No mucosal edema.  Mouth/Throat: No oropharyngeal exudate or posterior oropharyngeal edema.  Eyes: Pupils are equal, round, and reactive to light. Conjunctivae, EOM and lids are normal.  Neck: No JVD present. Carotid bruit is not present. No edema present. No thyroid mass and no thyromegaly present.  Cardiovascular: S1 normal and S2 normal. Exam reveals no gallop.  No murmur heard. Pulses:      Dorsalis pedis pulses are 2+ on the right side, and 2+ on the left side.  Respiratory: No respiratory distress. He has no wheezes. He has no rhonchi. He has no rales.  GI: Soft. Bowel sounds are normal. There is no tenderness.  Musculoskeletal:       Right ankle: He exhibits no swelling.       Left ankle: He exhibits no swelling.  Lymphadenopathy:    He has no cervical adenopathy.   Neurological: He is alert and oriented to person, place, and time. No cranial nerve deficit.  Skin: Skin is warm. Nails show no clubbing.  Left calf active ulceration around 6 x 6 cm.  Left thigh numerous older skin lesions that are dried up but not totally healed.  Psychiatric: He has a normal mood and affect.      Data Reviewed: Basic Metabolic Panel: Recent Labs  Lab 12/27/17 1440 12/28/17 0403 12/29/17 0619 12/30/17 0559 12/31/17 0424  NA 139 138 136 136 137  K 4.3 4.6 4.1 4.4 4.6  CL 103 105 104 105 103  CO2 24 25 26 27 28   GLUCOSE 167* 155* 162* 119* 146*  BUN 16 17 19 19 19   CREATININE 0.97 0.66 0.76 0.61 0.86  CALCIUM 10.5* 9.5 9.0 9.1 9.1  MG 2.1 2.0 2.5* 2.6* 2.4  PHOS  --  3.9 3.5 4.1 3.4   Liver Function Tests: Recent Labs  Lab 12/27/17 1440 12/28/17 0403 12/29/17 0619 12/30/17 0559 12/31/17 0424  AST 26 20 17  13* 16  ALT 35 29 23 18 20   ALKPHOS 55 46 41 40 39  BILITOT 0.9 0.8 0.8 0.8 0.8  PROT 8.1 7.5 6.6 6.5 6.3*  ALBUMIN 5.2* 4.6 3.8 3.8 3.7   CBC: Recent Labs  Lab 12/27/17 1440 12/28/17 0403 12/29/17 0619 12/30/17 0559 12/31/17 0424  WBC 11.3* 7.4 4.3 5.0 6.0  NEUTROABS 10.0* 6.9 4.1 4.7 5.8  HGB 12.8* 12.1* 11.4* 10.5* 10.6*  HCT 40.6 37.9*  35.5* 33.5* 33.1*  MCV 99.5 100.5* 98.9 100.6* 100.3*  PLT 392 355 273 230 195    Scheduled Meds: . [START ON 01/01/2018] CISplatin  25 mg/m2 (Treatment Plan Recorded) Intravenous Once  . clobetasol ointment   Topical BID  . D5-1/2NS + KCL + MG +/- MANNITOL IV CISplatin hydration   Intravenous Once  . gabapentin  300 mg Oral QHS  . ifosfamide/mesna (IFEX/MESNEX) CHEMO IV infusion   Intravenous Once  . pantoprazole  40 mg Oral Daily  . potassium chloride  10 mEq Oral BID  . predniSONE  20 mg Oral Q breakfast  . rivaroxaban  20 mg Oral Daily   Continuous Infusions: . sodium chloride Stopped (12/31/17 0431)  . sodium chloride    . dexamethasone (DECADRON) IVPB [facility]    . famotidine    .  mesna    . [START ON 01/01/2018] mesna      Assessment/Plan:  1. Recurrent testicular cancer.  Chemotherapy as per oncology.  The chemotherapy will finish up tomorrow and hopefully be discharged home. 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  Currently on 20 mg of prednisone 3. Left lower extremity DVT on Xarelto 4. Chronic pain on oxycodone 5. 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 GERD on PPI  Code Status:     Code Status Orders  (From admission, onward)         Start     Ordered   12/27/17 1707  Full code  Continuous     12/27/17 1706        Code Status History    Date Active Date Inactive Code Status Order ID Comments User Context   12/07/2017 1801 12/11/2017 1509 Full Code [id number]  [name], MD Inpatient   11/22/2017 1123 11/27/2017 1438 Full Code [id number]  Saundra Shelling, MD Inpatient   11/05/2017 1442 11/09/2017 2046 Full Code [id number]  Gorden Harms, MD Inpatient   10/21/2017 1110 10/26/2017 1635 Full Code [id number]  Vaughan Basta, MD Inpatient   10/08/2017 1621 10/16/2017 1836 Full Code [id number]  Vaughan Basta, MD Inpatient      Disposition Plan: Will go home after chemotherapy on Saturday morning  Consultants:  Oncology  Time spent: 22 minutes  [place]

## 2017-12-31 NOTE — Progress Notes (Signed)
Narcotic refill request: 12/31/17  Edgar [name] reviewed prior to consideration of refills:  NARX scores --    Narcotic: 521  Sedative: 391  30 day average MME/day: 31.50  30 day average LME/day: 1.33  ORS score (range 0-999): 540  Providers outside of this practice prescribing narcotics/sedatives in the recent past: YES      Patient contacted the office and requested a refill of his clonazepam. He notes severe anxiety related to his treatments and his father who is also being treated for cancer. Discussed concerns related to concurrent opioid and BZO use. Patient indicates responsible use. Given a current oncology diagnosis, this patient has the potential to experience significant cancer related pain. With what he is going through, the potential for concurrent anxiety is also understandable. Benefits versus risks associated with continued therapy considered. Patient has used this combination of therapy in the past and tolerated well. Reviewed request with attending oncologist Edgar Gip, MD) who is ok with refill request for the clonazepam in the setting of concurrent opioid use.    Will continue symptom management as previously prescribed. Patient educated that medications should not be bitten, chewed, or crushed. Additionally, safety precautions reviewed. Patient verbalized understanding that medications should not be sold or shared, taken with alcohol, or used while driving. He has been made aware of the side effects of using this medication. Patient understands that these medications can cause CNS depression, increase his risk of falls, and even lead to overdose that may result in death, if used outside of the parameters that he and I discussed. He was encouraged to stagger dosing, avoiding concurrent use, to prevent oversedation. He verbalized understanding. With all of this in mind, he accepts the risks and responsibilities associated with therapy and elects to continue to use the  prescribed interventions.   Refill prescription sent in for: 1. Clonazepam 0.5 mg BID PRN anxiety (Disp #30).    Edgar Loh, MSN, APRN, FNP-C, CEN Oncology/Hematology Nurse Practitioner  [facility] 12/31/17 4:42 PM

## 2017-12-31 NOTE — Progress Notes (Addendum)
[facility] Hematology/Oncology Progress Note  Date of admission: 12/27/2017  Hospital day:  12/31/2017  Chief Complaint: Edgar [name] is a 27 y.o. male with recurrent testicular cancer who was admitted through the medical oncology clinic for cycle #4 TIP chemotherapy.  Subjective:  Patient denies any nausea, vomiting, diarrhea.  Little mouth sore/irritation.  No hematuria.  Legs and left calf feel the same.    Social History: The patient is accompanied by his maternal grandmother today.  Allergies: No Known Allergies  Scheduled Medications: . [START ON 01/01/2018] CISplatin  25 mg/m2 (Treatment Plan Recorded) Intravenous Once  . clobetasol ointment   Topical BID  . D5-1/2NS + KCL + MG +/- MANNITOL IV CISplatin hydration   Intravenous Once  . gabapentin  300 mg Oral QHS  . ifosfamide/mesna (IFEX/MESNEX) CHEMO IV infusion   Intravenous Once  . pantoprazole  40 mg Oral Daily  . potassium chloride  10 mEq Oral BID  . predniSONE  20 mg Oral Q breakfast  . rivaroxaban  20 mg Oral Daily    Review of Systems: GENERAL:  Feels "the same".  No fevers, sweats. PERFORMANCE STATUS (ECOG):  1 HEENT:  No visual changes, runny nose, sore throat, mouth sores or tenderness. Lungs: No shortness of breath or cough.  No hemoptysis. Cardiac:  No chest pain, palpitations, orthopnea, or PND. GI:  No nausea, vomiting, diarrhea, constipation, melena or hematochezia. GU:  No urgency, frequency, dysuria, or hematuria. Musculoskeletal:  No back pain.  No joint pain.  No muscle tenderness. Extremities:  Mild cramping in legs (associates with chemotherapy).  No swelling. Skin:  Left calf lesion "the same".  No new lesions. Neuro:  No headache, numbness or weakness, balance or coordination issues. Endocrine:  No diabetes, thyroid issues, hot flashes or night sweats. Psych:  No mood changes, depression or anxiety. Pain:  Well controlled. Review of systems:  All other systems reviewed  and found to be negative.   Physical Exam: Blood pressure 126/85, pulse 66, temperature 97.8 F (36.6 C), temperature source Oral, resp. rate 16, height 6' 3"  (1.905 m), weight 255 lb 8.2 oz (115.9 kg), SpO2 99 %.  GENERAL:  Well developed, well nourished, young man sitting comfortably watching TV on the medical unit in no acute distress. MENTAL STATUS:  Alert and oriented to person, place and time. HEAD:  Alopecia.  Normocephalic, atraumatic, face symmetric, no Cushingoid features. EYES:  Brown eyes.  Pupils equal round and reactive to light and accomodation.  No conjunctivitis or scleral icterus. ENT:  No herpetic lesions.  Oropharynx with barely visible ulcer left lower inner lip. Tongue normal. Mucous membranes moist.  RESPIRATORY:  Clear to auscultation without rales, wheezes or rhonchi. CARDIOVASCULAR:  Regular rate and rhythm without murmur, rub or gallop. ABDOMEN:  Soft, non-tender, with active bowel sounds, and no hepatosplenomegaly.  No masses. SKIN:  Left calf unwrapped.  Left calf lesion flatter and healing.  No new lesions. EXTREMITIES: No edema, no skin discoloration or tenderness.  No palpable cords. NEUROLOGICAL: Unremarkable.  Finger to nose and RAM normal. PSYCH:  Appropriate.    Results for orders placed or performed during the hospital encounter of 12/27/17 (from the past 48 hour(s))  CBC with Differential     Status: Abnormal   Collection Time: 12/30/17  5:59 AM  Result Value Ref Range   WBC 5.0 4.0 - 10.5 K/uL   RBC 3.33 (L) 4.22 - 5.81 MIL/uL   Hemoglobin 10.5 (L) 13.0 - 17.0 g/dL  HCT 33.5 (L) 39.0 - 52.0 %   MCV 100.6 (H) 80.0 - 100.0 fL   MCH 31.5 26.0 - 34.0 pg   MCHC 31.3 30.0 - 36.0 g/dL   RDW 16.3 (H) 11.5 - 15.5 %   Platelets 230 150 - 400 K/uL   nRBC 0.0 0.0 - 0.2 %   Neutrophils Relative % 93 %   Neutro Abs 4.7 1.7 - 7.7 K/uL   Lymphocytes Relative 3 %   Lymphs Abs 0.2 (L) 0.7 - 4.0 K/uL   Monocytes Relative 3 %   Monocytes Absolute 0.1 0.1 -  1.0 K/uL   Eosinophils Relative 0 %   Eosinophils Absolute 0.0 0.0 - 0.5 K/uL   Basophils Relative 0 %   Basophils Absolute 0.0 0.0 - 0.1 K/uL   Immature Granulocytes 1 %   Abs Immature Granulocytes 0.03 0.00 - 0.07 K/uL    Comment: Performed at [facility], [address] [place], [place] [address]  Comprehensive metabolic panel     Status: Abnormal   Collection Time: 12/30/17  5:59 AM  Result Value Ref Range   Sodium 136 135 - 145 mmol/L   Potassium 4.4 3.5 - 5.1 mmol/L   Chloride 105 98 - 111 mmol/L   CO2 27 22 - 32 mmol/L   Glucose, Bld 119 (H) 70 - 99 mg/dL   BUN 19 6 - 20 mg/dL   Creatinine, Ser 0.61 0.61 - 1.24 mg/dL   Calcium 9.1 8.9 - 10.3 mg/dL   Total Protein 6.5 6.5 - 8.1 g/dL   Albumin 3.8 3.5 - 5.0 g/dL   AST 13 (L) 15 - 41 U/L   ALT 18 0 - 44 U/L   Alkaline Phosphatase 40 38 - 126 U/L   Total Bilirubin 0.8 0.3 - 1.2 mg/dL   GFR calc non Af Amer >60 >60 mL/min   GFR calc Af Amer >60 >60 mL/min    Comment: (NOTE) The eGFR has been calculated using the CKD EPI equation. This calculation has not been validated in all clinical situations. eGFR's persistently <60 mL/min signify possible Chronic Kidney Disease.    Anion gap 4 (L) 5 - 15    Comment: Performed at [facility], [address] [place], [place] [address]  Magnesium     Status: Abnormal   Collection Time: 12/30/17  5:59 AM  Result Value Ref Range   Magnesium 2.6 (H) 1.7 - 2.4 mg/dL    Comment: Performed at [facility], [address] [place], [place] [address]  Phosphorus     Status: None   Collection Time: 12/30/17  5:59 AM  Result Value Ref Range   Phosphorus 4.1 2.5 - 4.6 mg/dL    Comment: Performed at [facility], [address] [place], [place] [address]  Urinalysis, Complete w Microscopic     Status: Abnormal   Collection Time: 12/30/17  5:59 AM  Result Value Ref Range   Color, Urine YELLOW (A) YELLOW   APPearance CLEAR (A) CLEAR   Specific  Gravity, Urine 1.020 1.005 - 1.030   pH 5.0 5.0 - 8.0   Glucose, UA NEGATIVE NEGATIVE mg/dL   Hgb urine dipstick NEGATIVE NEGATIVE   Bilirubin Urine NEGATIVE NEGATIVE   Ketones, ur 80 (A) NEGATIVE mg/dL   Protein, ur NEGATIVE NEGATIVE mg/dL   Nitrite NEGATIVE NEGATIVE   Leukocytes, UA NEGATIVE NEGATIVE   WBC, UA NONE SEEN 0 - 5 WBC/hpf   Bacteria, UA NONE SEEN NONE SEEN   Squamous  Epithelial / LPF NONE SEEN 0 - 5   Mucus PRESENT     Comment: Performed at [facility], [address] [place], [place] [address]  CBC with Differential     Status: Abnormal   Collection Time: 12/31/17  4:24 AM  Result Value Ref Range   WBC 6.0 4.0 - 10.5 K/uL   RBC 3.30 (L) 4.22 - 5.81 MIL/uL   Hemoglobin 10.6 (L) 13.0 - 17.0 g/dL   HCT 33.1 (L) 39.0 - 52.0 %   MCV 100.3 (H) 80.0 - 100.0 fL   MCH 32.1 26.0 - 34.0 pg   MCHC 32.0 30.0 - 36.0 g/dL   RDW 16.3 (H) 11.5 - 15.5 %   Platelets 195 150 - 400 K/uL   nRBC 0.0 0.0 - 0.2 %   Neutrophils Relative % 96 %   Neutro Abs 5.8 1.7 - 7.7 K/uL   Lymphocytes Relative 3 %   Lymphs Abs 0.2 (L) 0.7 - 4.0 K/uL   Monocytes Relative 1 %   Monocytes Absolute 0.0 (L) 0.1 - 1.0 K/uL   Eosinophils Relative 0 %   Eosinophils Absolute 0.0 0.0 - 0.5 K/uL   Basophils Relative 0 %   Basophils Absolute 0.0 0.0 - 0.1 K/uL   Immature Granulocytes 0 %   Abs Immature Granulocytes 0.02 0.00 - 0.07 K/uL    Comment: Performed at [facility], [address] [place], [place] [address]  Comprehensive metabolic panel     Status: Abnormal   Collection Time: 12/31/17  4:24 AM  Result Value Ref Range   Sodium 137 135 - 145 mmol/L   Potassium 4.6 3.5 - 5.1 mmol/L   Chloride 103 98 - 111 mmol/L   CO2 28 22 - 32 mmol/L   Glucose, Bld 146 (H) 70 - 99 mg/dL   BUN 19 6 - 20 mg/dL   Creatinine, Ser 0.86 0.61 - 1.24 mg/dL   Calcium 9.1 8.9 - 10.3 mg/dL   Total Protein 6.3 (L) 6.5 - 8.1 g/dL   Albumin 3.7 3.5 - 5.0 g/dL   AST 16 15 - 41 U/L   ALT 20 0 - 44  U/L   Alkaline Phosphatase 39 38 - 126 U/L   Total Bilirubin 0.8 0.3 - 1.2 mg/dL   GFR calc non Af Amer >60 >60 mL/min   GFR calc Af Amer >60 >60 mL/min    Comment: (NOTE) The eGFR has been calculated using the CKD EPI equation. This calculation has not been validated in all clinical situations. eGFR's persistently <60 mL/min signify possible Chronic Kidney Disease.    Anion gap 6 5 - 15    Comment: Performed at [facility], [address] [place], [place] [address]  Magnesium     Status: None   Collection Time: 12/31/17  4:24 AM  Result Value Ref Range   Magnesium 2.4 1.7 - 2.4 mg/dL    Comment: Performed at [facility], [address] [place], [place] [address]  Phosphorus     Status: None   Collection Time: 12/31/17  4:24 AM  Result Value Ref Range   Phosphorus 3.4 2.5 - 4.6 mg/dL    Comment: Performed at [facility], [address] [place], [place] [address]  Urinalysis, Complete w Microscopic     Status: Abnormal   Collection Time: 12/31/17  4:25 AM  Result Value Ref Range   Color, Urine YELLOW (A) YELLOW   APPearance CLEAR (A) CLEAR   Specific Gravity, Urine  1.016 1.005 - 1.030   pH 6.0 5.0 - 8.0   Glucose, UA 50 (A) NEGATIVE mg/dL   Hgb urine dipstick SMALL (A) NEGATIVE   Bilirubin Urine NEGATIVE NEGATIVE   Ketones, ur 80 (A) NEGATIVE mg/dL   Protein, ur NEGATIVE NEGATIVE mg/dL   Nitrite NEGATIVE NEGATIVE   Leukocytes, UA NEGATIVE NEGATIVE   RBC / HPF 0-5 0 - 5 RBC/hpf   WBC, UA 0-5 0 - 5 WBC/hpf   Bacteria, UA NONE SEEN NONE SEEN   Squamous Epithelial / LPF 0-5 0 - 5   Mucus PRESENT     Comment: Performed at [facility], [address] [place], [place] [address]   No results found.  Assessment:  Edgar [name] is a 27 y.o. male with recurrent testicular cancer who was admitted for cycle #4 TIP chemotherapy.  He has a history of LLE DVT.  He is s/p IVC filterplacement, percutaneous angioplastyand  stentplacementof the left common iliac vein and left external iliac vein on 10/13/2017. He is on Xarelto.  He stepped on a nail on 11/01/2017.  Right foot MRI on 11/17/2017 revealed artifact in the plantar soft tissues over the first metatarsal head likely representing metallic foreign bodies. There was an area of increased T2 signal intensity in the plantar aspect of the first metatarsal head with focal enhancement may indicate an area of focal osteomyelitis. There was no soft tissue abscess.  He is followed by Dr. Cleda Mccreedy, podiatrist, without intervention.  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 He is on prednisone 20 mg daily.  He has electrolyte wasting (magnesium and potassium) secondary to cisplatin.  He is on supplementation.  Symptomatically, he feels "the same".  Calf lesion has improved.  Plan:   1.   Oncology:  Cycle #4 TIP chemotherapy.    Paclitaxel 250 mg/m2 CI over 24 hours on Day 1- completed.  Mesna 500 mg/m2 over 15 minutes before ifosfamide, then at 4 hours and 8 hours from the start of each ifosfamide dose on day 2-5.  Ifosfamide 1500 mg/m2 over 1 hour daily on days 2-5.  Cisplatin 25 mg/m2 over 60 minutes on days 2-5.  Hydration pre and post ifosfamide and cisplatin.  Premed for Taxol: Famotidine, Benadryl, Decadron  Daily lab check: CBC with diff, CMP, Mg, phosphorus, urinalysis.  Neuro checks daily.  Anticipate daily GCSF in outpatient department post chemotherapy  Patient received day 4 chemotherapy early this morning.  He denies any nausea or vomiting.  Trace hemoglobin in urine.  Discussed good hydration.  2.   Hematology:  Left lower extremity DVT s/p stent placement.  Continue Xarelto.  Counts adequate.  CBC daily.  3.   Dermatology:  Sweet's syndome on prednisone 20 mg a day.  Topical clobetasol.  Wound improved.  Per Dr. Marolyn Hammock, [facility] dermatologist, keep wound covered and no debriding.  He has appointment on 01/05/2018 at [facility].   Patient confirmed that he received a phone call from Dr. Sheliah Plane office.  4.   Fluids, Electrolyes and Nutrition:  Magnesium normal. Magnesium currently on hold secondary to 2 days of an elevated magnesium.  May need to restart tomorrow.  Follow labs as above daily.  Ensure total daily fluid 2-3 liters.  Encouraged fluids today.  Urinalysis unremarkable.  UA daily to assure no hemorrhagic cystitis. Trace hemoglobin today.  5.   Disposition:  Expect discharge home after chemotherapy complete this weekend if tolerating well.  Will begin daily GCSF on Monday, 01/03/2018.   [name],  MD  12/31/2017, 2:44 PM

## 2017-12-31 NOTE — Plan of Care (Signed)

## 2018-01-01 LAB — CBC WITH DIFFERENTIAL/PLATELET
Abs Immature Granulocytes: 0.01 10*3/uL (ref 0.00–0.07)
Basophils Absolute: 0 10*3/uL (ref 0.0–0.1)
Basophils Relative: 0 %
Eosinophils Absolute: 0 10*3/uL (ref 0.0–0.5)
Eosinophils Relative: 1 %
HCT: 34.5 % — ABNORMAL LOW (ref 39.0–52.0)
Hemoglobin: 11 g/dL — ABNORMAL LOW (ref 13.0–17.0)
Immature Granulocytes: 0 %
Lymphocytes Relative: 5 %
Lymphs Abs: 0.3 10*3/uL — ABNORMAL LOW (ref 0.7–4.0)
MCH: 31.6 pg (ref 26.0–34.0)
MCHC: 31.9 g/dL (ref 30.0–36.0)
MCV: 99.1 fL (ref 80.0–100.0)
Monocytes Absolute: 0 10*3/uL — ABNORMAL LOW (ref 0.1–1.0)
Monocytes Relative: 1 %
Neutro Abs: 4.8 10*3/uL (ref 1.7–7.7)
Neutrophils Relative %: 93 %
Platelets: 191 10*3/uL (ref 150–400)
RBC: 3.48 MIL/uL — ABNORMAL LOW (ref 4.22–5.81)
RDW: 15.9 % — ABNORMAL HIGH (ref 11.5–15.5)
WBC: 5.1 10*3/uL (ref 4.0–10.5)
nRBC: 0 % (ref 0.0–0.2)

## 2018-01-01 LAB — COMPREHENSIVE METABOLIC PANEL
ALT: 22 U/L (ref 0–44)
AST: 16 U/L (ref 15–41)
Albumin: 3.8 g/dL (ref 3.5–5.0)
Alkaline Phosphatase: 40 U/L (ref 38–126)
Anion gap: 8 (ref 5–15)
BUN: 22 mg/dL — ABNORMAL HIGH (ref 6–20)
CO2: 25 mmol/L (ref 22–32)
Calcium: 9.3 mg/dL (ref 8.9–10.3)
Chloride: 104 mmol/L (ref 98–111)
Creatinine, Ser: 0.72 mg/dL (ref 0.61–1.24)
GFR calc Af Amer: >60 mL/min (ref >60)
GFR calc non Af Amer: >60 mL/min (ref >60)
Glucose, Bld: 104 mg/dL — ABNORMAL HIGH (ref 70–99)
Potassium: 4.5 mmol/L (ref 3.5–5.1)
Sodium: 137 mmol/L (ref 135–145)
Total Bilirubin: 0.7 mg/dL (ref 0.3–1.2)
Total Protein: 6.6 g/dL (ref 6.5–8.1)

## 2018-01-01 LAB — URINALYSIS, COMPLETE (UACMP) WITH MICROSCOPIC
Bacteria, UA: NONE SEEN
Bilirubin Urine: NEGATIVE
Glucose, UA: >=500 mg/dL — AB
Hgb urine dipstick: NEGATIVE
Ketones, ur: 80 mg/dL — AB
Leukocytes, UA: NEGATIVE
Nitrite: NEGATIVE
Protein, ur: NEGATIVE mg/dL
Specific Gravity, Urine: 1.013 (ref 1.005–1.030)
Squamous Epithelial / LPF: NONE SEEN (ref 0–5)
pH: 7 (ref 5.0–8.0)

## 2018-01-01 LAB — MAGNESIUM: Magnesium: 2.2 mg/dL (ref 1.7–2.4)

## 2018-01-01 LAB — PHOSPHORUS: Phosphorus: 4.4 mg/dL (ref 2.5–4.6)

## 2018-01-01 MED ORDER — OXYCODONE HCL 10 MG PO TABS: 10.0000 mg | ORAL_TABLET | Freq: Three times a day (TID) | ORAL | 0 refills | Status: DC | PRN

## 2018-01-01 MED ORDER — HEPARIN SOD (PORK) LOCK FLUSH 100 UNIT/ML IV SOLN
500.0000 [IU] | Freq: Once | INTRAVENOUS | Status: DC
  Filled 2018-01-01: qty 5

## 2018-01-01 MED ORDER — CLONAZEPAM 0.5 MG PO TBDP: 0.5000 mg | ORAL_TABLET | Freq: Two times a day (BID) | ORAL | 0 refills | Status: DC | PRN

## 2018-01-01 NOTE — Discharge Summary (Signed)
Edgar [name], [place] y.o., DOB [birth year], MRN [id number]. 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 male with a known history of asthma, testicular cancer, DVT, gastroesophageal reflux disease, sweet syndrome-sent from cancer center for his scheduled inpatient chemotherapy.  Patient has had significant complications related to his outpatient chemotherapy therefore he has his chemo inside the [name].  Patient tolerated the chemo without any complications.  Currently stable for discharge.            Consults  hematology/oncology  Significant Tests:  See full reports for all details     Dg Chest 2 View  Result Date: 12/08/2017 CLINICAL DATA:  Fever EXAM: CHEST - 2 VIEW COMPARISON:  11/04/2017 chest radiograph. FINDINGS: Right internal jugular MediPort terminates at the cavoatrial junction. Stable cardiomediastinal silhouette with normal heart size. No pneumothorax. No pleural effusion. Lungs appear clear, with no acute consolidative airspace disease and no pulmonary edema. IMPRESSION: No active cardiopulmonary disease. Electronically Signed   By: Ilona Sorrel M.D.   On: 12/08/2017 01:56   Ct Abdomen Pelvis W Contrast  Result Date: 12/08/2017 CLINICAL DATA:  Recurrent testicular cancer. Most recent chemotherapy a few weeks prior. Lower extremity swelling. Recent discontinuation of Xarelto anticoagulation due to thrombocytopenia. Left lower quadrant abdominal pain. EXAM: CT ABDOMEN AND PELVIS WITH CONTRAST TECHNIQUE: Multidetector CT imaging of the abdomen and pelvis was  performed using the standard protocol following bolus administration of intravenous contrast. CONTRAST:  153mL ISOVUE-300 IOPAMIDOL (ISOVUE-300) INJECTION 61% COMPARISON:  11/05/2017 CT abdomen/pelvis. FINDINGS: Lower chest: No significant pulmonary nodules or acute consolidative airspace disease. Tip of superior approach central venous catheter is seen at the cavoatrial junction. Hepatobiliary: Normal liver size. No liver mass. Normal gallbladder with no radiopaque cholelithiasis. No biliary ductal dilatation. Pancreas: Normal, with no mass or duct dilation. Spleen: Normal size. No mass. Adrenals/Urinary Tract: Normal adrenals. Normal size kidneys with no renal masses. Mild fullness of the central right renal collecting system without overt right hydronephrosis. No left hydronephrosis. Normal bladder. Stomach/Bowel: Normal non-distended stomach. A few mildly dilated small bowel loops in the left abdomen measuring up to 3.3 cm diameter. No focal small bowel caliber transition. No small bowel wall thickening. Oral contrast transits to the mid small bowel. Normal appendix. Normal large bowel with no diverticulosis, large bowel wall thickening or pericolonic fat stranding. Vascular/Lymphatic: Normal caliber abdominal aorta. Patent hepatic, portal, splenic and renal veins. IVC filter is in place below the level of the renal veins. Left common and external iliac venous stent is in place and appears patent. No evidence of venous thrombosis. Mildly enlarged left inguinal nodes up to 1.2 cm (series 2/image 95), stable since 11/05/2017 CT. Enlarged 2.3 cm left external iliac node (series 2/image 80), previously 2.4 cm using similar measurement technique, stable. Left common iliac 1.2 cm enlarged node (series 2/image 65), previously 1.6 cm, mildly decreased. Mild left para-aortic adenopathy up to 1.1 cm (series 2/image 57), previously 1.2 cm using similar measurement technique, not appreciably changed. No new pathologically  enlarged abdominopelvic nodes. Reproductive: Normal size prostate. Other: No pneumoperitoneum, ascites or focal fluid collection. Fat stranding throughout the left inguinal region is not significantly changed.  Musculoskeletal: No aggressive appearing focal osseous lesions. IMPRESSION: 1. Left para-aortic, left common iliac, left external iliac and left inguinal lymphadenopathy is stable to mildly decreased since 11/05/2017 CT. No new or progressive metastatic disease. 2. No acute vascular abnormality. IVC filter and left iliac venous stent graft are in place. 3. Persistent postsurgical fat stranding throughout the left inguinal region without fluid collection. Electronically Signed   By: Ilona Sorrel M.D.   On: 12/08/2017 01:55   US Venous Img Lower Bilateral  Addendum Date: 12/08/2017   ADDENDUM REPORT: 12/08/2017 12:04 ADDENDUM: There is an error in the report: Nonocclusive thrombus is in the left common femoral vein. Electronically Signed   By: Aletta Edouard M.D.   On: 12/08/2017 12:04   Result Date: 12/08/2017 CLINICAL DATA:  Bilateral lower extremity edema. EXAM: BILATERAL LOWER EXTREMITY VENOUS DOPPLER ULTRASOUND TECHNIQUE: Gray-scale sonography with graded compression, as well as color Doppler and duplex ultrasound were performed to evaluate the lower extremity deep venous systems from the level of the common femoral vein and including the common femoral, femoral, profunda femoral, popliteal and calf veins including the posterior tibial, peroneal and gastrocnemius veins when visible. The superficial great saphenous vein was also interrogated. Spectral Doppler was utilized to evaluate flow at rest and with distal augmentation maneuvers in the common femoral, femoral and popliteal veins. COMPARISON:  11/05/2017 FINDINGS: RIGHT LOWER EXTREMITY Common Femoral Vein: No evidence of thrombus. Normal compressibility, respiratory phasicity and response to augmentation. Saphenofemoral Junction: No evidence of  thrombus. Normal compressibility and flow on color Doppler imaging. Profunda Femoral Vein: No evidence of thrombus. Normal compressibility and flow on color Doppler imaging. Femoral Vein: No evidence of thrombus. Normal compressibility, respiratory phasicity and response to augmentation. Popliteal Vein: No evidence of thrombus. Normal compressibility, respiratory phasicity and response to augmentation. Calf Veins: No evidence of thrombus. Normal compressibility and flow on color Doppler imaging. Superficial Great Saphenous Vein: No evidence of thrombus. Normal compressibility. Venous Reflux:  None. Other Findings: No evidence of superficial thrombophlebitis or abnormal fluid collection. LEFT LOWER EXTREMITY Common Femoral Vein: There is evidence of new nonocclusive thrombus in the left common femoral artery along the anterior wall measuring approximately 3-5 mm in thickness. This does not create flow limitation with good flow seen in the lumen below the level of thrombus. However, compared to the prior study just 1 month ago, this finding is new and consistent with acute/subacute thrombus. Saphenofemoral Junction: No evidence of thrombus. Normal compressibility and flow on color Doppler imaging. Profunda Femoral Vein: No evidence of thrombus. Normal compressibility and flow on color Doppler imaging. Femoral Vein: No evidence of thrombus. Normal compressibility, respiratory phasicity and response to augmentation. Popliteal Vein: No evidence of thrombus. Normal compressibility, respiratory phasicity and response to augmentation. Calf Veins: No evidence of thrombus. Normal compressibility and flow on color Doppler imaging. Superficial Great Saphenous Vein: No evidence of thrombus. Normal compressibility. Venous Reflux:  None. Other Findings: No evidence of superficial thrombophlebitis or abnormal fluid collection. IMPRESSION: 1. New nonocclusive thrombus in the left common femoral artery along the anterior wall  measuring 3-5 mm in thickness. This is not flow limiting but is new since the prior study on 11/05/2017 and consistent with acute/subacute thrombus. 2. No evidence of right lower extremity deep vein thrombosis. Electronically Signed: By: Aletta Edouard M.D. On: 12/08/2017 11:12       Today   Subjective:   Edgar [name] patient denying any complaints Objective:   Blood pressure (!) 139/97, pulse 83, temperature 98 F (  36.7 C), temperature source Oral, resp. rate 20, height 6\' 3"  (1.905 m), weight 115.9 kg, SpO2 96 %.  .  Intake/Output Summary (Last 24 hours) at 01/01/2018 1458 Last data filed at 01/01/2018 1042 Gross per 24 hour  Intake 1120.68 ml  Output -  Net 1120.68 ml    Exam VITAL SIGNS: Blood pressure (!) 139/97, pulse 83, temperature 98 F (36.7 C), temperature source Oral, resp. rate 20, height 6\' 3"  (1.905 m), weight 115.9 kg, SpO2 96 %.  GENERAL:  27 y.o.-year-old patient lying in the bed with no acute distress.  EYES: Pupils equal, round, reactive to light and accommodation. No scleral icterus. Extraocular muscles intact.  HEENT: Head atraumatic, normocephalic. Oropharynx and nasopharynx clear.  NECK:  Supple, no jugular venous distention. No thyroid enlargement, no tenderness.  LUNGS: Normal breath sounds bilaterally, no wheezing, rales,rhonchi or crepitation. No use of accessory muscles of respiration.  CARDIOVASCULAR: S1, S2 normal. No murmurs, rubs, or gallops.  ABDOMEN: Soft, nontender, nondistended. Bowel sounds present. No organomegaly or mass.  EXTREMITIES: No pedal edema, cyanosis, or clubbing.  NEUROLOGIC: Cranial nerves II through XII are intact. Muscle strength 5/5 in all extremities. Sensation intact. Gait not checked.  PSYCHIATRIC: The patient is alert and oriented x 3.  SKIN: No obvious rash, lesion, or ulcer.   Data Review     CBC w Diff:  Lab Results  Component Value Date   WBC 5.1 01/01/2018   HGB 11.0 (L) 01/01/2018   HCT 34.5 (L)  01/01/2018   PLT 191 01/01/2018   LYMPHOPCT 5 01/01/2018   BANDSPCT 8 11/04/2017   MONOPCT 1 01/01/2018   EOSPCT 1 01/01/2018   BASOPCT 0 01/01/2018   CMP:  Lab Results  Component Value Date   NA 137 01/01/2018   K 4.5 01/01/2018   CL 104 01/01/2018   CO2 25 01/01/2018   BUN 22 (H) 01/01/2018   CREATININE 0.72 01/01/2018   PROT 6.6 01/01/2018   ALBUMIN 3.8 01/01/2018   BILITOT 0.7 01/01/2018   ALKPHOS 40 01/01/2018   AST 16 01/01/2018   ALT 22 01/01/2018  .  Micro Results No results found for this or any previous visit (from the past 240 hour(s)).      Code Status Orders  (From admission, onward)         Start     Ordered   12/27/17 1707  Full code  Continuous     12/27/17 1706        Code Status History    Date Active Date Inactive Code Status Order ID Comments User Context   12/07/2017 1801 12/11/2017 1509 Full Code [id number]  [name], MD Inpatient   11/22/2017 1123 11/27/2017 1438 Full Code [id number]  Saundra Shelling, MD Inpatient   11/05/2017 1442 11/09/2017 2046 Full Code [id number]  Gorden Harms, MD Inpatient   10/21/2017 1110 10/26/2017 1635 Full Code [id number]  Edgar Basta, MD Inpatient   10/08/2017 1621 10/16/2017 1836 Full Code [id number]  Edgar Basta, MD Inpatient            Discharge Medications   Allergies as of 01/01/2018   No Known Allergies     Medication List    TAKE these medications   clobetasol ointment 0.05 % Commonly known as:  TEMOVATE Apply topically 1-2 times a day to the area on your left calf with bandage changes   clonazePAM 0.5 MG disintegrating tablet Commonly known as:  KLONOPIN Take 1 tablet (0.5 mg total) by mouth 2 (two)  times daily as needed (anxiety). What changed:    when to take this  reasons to take this   gabapentin 300 MG capsule Commonly known as:  NEURONTIN Take 300 mg by mouth at bedtime.   magnesium oxide 400 MG tablet Commonly known as:  MAG-OX Take 400 mg by mouth  daily.   omeprazole 20 MG capsule Commonly known as:  PRILOSEC Take 1 capsule (20 mg total) by mouth daily.   Oxycodone HCl 10 MG Tabs Take 1 tablet (10 mg total) by mouth every 8 (eight) hours as needed.   potassium chloride 10 MEQ tablet Commonly known as:  K-DUR Take 10 mEq by mouth 2 (two) times daily.   predniSONE 10 MG tablet Commonly known as:  DELTASONE Take 3 tablets (30 mg total) by mouth 2 (two) times daily with a meal. What changed:    how much to take  when to take this   XARELTO 20 MG Tabs tablet Generic drug:  rivaroxaban Take 1 tablet by mouth daily.          Total Time in preparing paper work, data evaluation and todays exam - 82 minutes  Dustin Flock M.D on 01/01/2018 at 2:58 [place]  [phone]

## 2018-01-01 NOTE — Care Management Note (Signed)
Case Management Note  Patient Details  Name: Edgar [name] MRN: [id number] Date of Birth: Oct 08, 1990  Subjective/Objective:   Patient to be discharged per MD order. Orders in place for home health services. Patient currently active with [facility] and prefers to continue services. Notified [place] from [facility] that patient will be discharging with resumption of care orders. Family to provide transport.                    Action/Plan:   Expected Discharge Date:  01/01/18               Expected Discharge Plan:  [place]  In-House Referral:     Discharge planning Services  CM Consult  Post Acute Care Choice:  Home Health, Resumption of Svcs/PTA Provider Choice offered to:  Patient  DME Arranged:    DME Agency:     HH Arranged:  RN, PT, OT, Social Work [employer] Agency:  [facility]  Status of Service:  Completed, signed off  If discussed at [employer], dates discussed:    Additional Comments:  Kathyrn Drown Jonda Alanis, RN 01/01/2018, 1:09 PM

## 2018-01-01 NOTE — Progress Notes (Signed)
Pt being discharged home, discharge instructions and prescriptions reviewed with pt, states understanding, no complaints noted at discharge

## 2018-01-03 ENCOUNTER — Encounter: Payer: Self-pay | Admitting: Urgent Care

## 2018-01-03 ENCOUNTER — Telehealth: Payer: Self-pay | Admitting: *Deleted

## 2018-01-03 ENCOUNTER — Inpatient Hospital Stay: Payer: BLUE CROSS/BLUE SHIELD

## 2018-01-03 ENCOUNTER — Inpatient Hospital Stay (HOSPITAL_BASED_OUTPATIENT_CLINIC_OR_DEPARTMENT_OTHER): Payer: BLUE CROSS/BLUE SHIELD | Admitting: Urgent Care

## 2018-01-03 VITALS — BP 138/99 | HR 128 | Temp 97.7°F | Resp 18 | Ht 75.0 in | Wt 255.9 lb

## 2018-01-03 DIAGNOSIS — D6481 Anemia due to antineoplastic chemotherapy: Secondary | ICD-10-CM | POA: Diagnosis not present

## 2018-01-03 DIAGNOSIS — L88 Pyoderma gangrenosum: Secondary | ICD-10-CM | POA: Diagnosis not present

## 2018-01-03 DIAGNOSIS — Z86718 Personal history of other venous thrombosis and embolism: Secondary | ICD-10-CM

## 2018-01-03 DIAGNOSIS — L97929 Non-pressure chronic ulcer of unspecified part of left lower leg with unspecified severity: Secondary | ICD-10-CM | POA: Diagnosis not present

## 2018-01-03 DIAGNOSIS — R1032 Left lower quadrant pain: Secondary | ICD-10-CM | POA: Diagnosis not present

## 2018-01-03 DIAGNOSIS — D61810 Antineoplastic chemotherapy induced pancytopenia: Secondary | ICD-10-CM | POA: Diagnosis not present

## 2018-01-03 DIAGNOSIS — E876 Hypokalemia: Secondary | ICD-10-CM

## 2018-01-03 DIAGNOSIS — K219 Gastro-esophageal reflux disease without esophagitis: Secondary | ICD-10-CM | POA: Diagnosis not present

## 2018-01-03 DIAGNOSIS — Z801 Family history of malignant neoplasm of trachea, bronchus and lung: Secondary | ICD-10-CM | POA: Diagnosis not present

## 2018-01-03 DIAGNOSIS — F17210 Nicotine dependence, cigarettes, uncomplicated: Secondary | ICD-10-CM | POA: Diagnosis not present

## 2018-01-03 DIAGNOSIS — R52 Pain, unspecified: Secondary | ICD-10-CM

## 2018-01-03 DIAGNOSIS — Z716 Tobacco abuse counseling: Secondary | ICD-10-CM | POA: Diagnosis not present

## 2018-01-03 DIAGNOSIS — T451X5A Adverse effect of antineoplastic and immunosuppressive drugs, initial encounter: Secondary | ICD-10-CM

## 2018-01-03 DIAGNOSIS — C6292 Malignant neoplasm of left testis, unspecified whether descended or undescended: Secondary | ICD-10-CM | POA: Diagnosis not present

## 2018-01-03 DIAGNOSIS — Z7952 Long term (current) use of systemic steroids: Secondary | ICD-10-CM | POA: Diagnosis not present

## 2018-01-03 DIAGNOSIS — Z7901 Long term (current) use of anticoagulants: Secondary | ICD-10-CM | POA: Diagnosis not present

## 2018-01-03 DIAGNOSIS — D6959 Other secondary thrombocytopenia: Secondary | ICD-10-CM | POA: Diagnosis not present

## 2018-01-03 DIAGNOSIS — G629 Polyneuropathy, unspecified: Secondary | ICD-10-CM | POA: Diagnosis not present

## 2018-01-03 DIAGNOSIS — R112 Nausea with vomiting, unspecified: Secondary | ICD-10-CM

## 2018-01-03 DIAGNOSIS — C6290 Malignant neoplasm of unspecified testis, unspecified whether descended or undescended: Secondary | ICD-10-CM | POA: Diagnosis not present

## 2018-01-03 DIAGNOSIS — J45909 Unspecified asthma, uncomplicated: Secondary | ICD-10-CM

## 2018-01-03 DIAGNOSIS — D701 Agranulocytosis secondary to cancer chemotherapy: Secondary | ICD-10-CM | POA: Diagnosis not present

## 2018-01-03 DIAGNOSIS — G620 Drug-induced polyneuropathy: Secondary | ICD-10-CM

## 2018-01-03 DIAGNOSIS — Z800 Family history of malignant neoplasm of digestive organs: Secondary | ICD-10-CM

## 2018-01-03 DIAGNOSIS — Z8709 Personal history of other diseases of the respiratory system: Secondary | ICD-10-CM | POA: Diagnosis not present

## 2018-01-03 DIAGNOSIS — C6212 Malignant neoplasm of descended left testis: Secondary | ICD-10-CM | POA: Diagnosis not present

## 2018-01-03 DIAGNOSIS — D649 Anemia, unspecified: Secondary | ICD-10-CM | POA: Diagnosis not present

## 2018-01-03 DIAGNOSIS — Z803 Family history of malignant neoplasm of breast: Secondary | ICD-10-CM

## 2018-01-03 DIAGNOSIS — L982 Febrile neutrophilic dermatosis [Sweet]: Secondary | ICD-10-CM

## 2018-01-03 DIAGNOSIS — D709 Neutropenia, unspecified: Secondary | ICD-10-CM | POA: Diagnosis not present

## 2018-01-03 DIAGNOSIS — Z79899 Other long term (current) drug therapy: Secondary | ICD-10-CM | POA: Diagnosis not present

## 2018-01-03 DIAGNOSIS — R609 Edema, unspecified: Secondary | ICD-10-CM

## 2018-01-03 DIAGNOSIS — Z808 Family history of malignant neoplasm of other organs or systems: Secondary | ICD-10-CM | POA: Diagnosis not present

## 2018-01-03 DIAGNOSIS — G8929 Other chronic pain: Secondary | ICD-10-CM | POA: Diagnosis not present

## 2018-01-03 DIAGNOSIS — Z95828 Presence of other vascular implants and grafts: Secondary | ICD-10-CM | POA: Diagnosis not present

## 2018-01-03 DIAGNOSIS — Z7689 Persons encountering health services in other specified circumstances: Secondary | ICD-10-CM | POA: Diagnosis not present

## 2018-01-03 DIAGNOSIS — C6210 Malignant neoplasm of unspecified descended testis: Secondary | ICD-10-CM

## 2018-01-03 DIAGNOSIS — Z9079 Acquired absence of other genital organ(s): Secondary | ICD-10-CM | POA: Diagnosis not present

## 2018-01-03 DIAGNOSIS — R600 Localized edema: Secondary | ICD-10-CM

## 2018-01-03 DIAGNOSIS — Z8619 Personal history of other infectious and parasitic diseases: Secondary | ICD-10-CM | POA: Diagnosis not present

## 2018-01-03 DIAGNOSIS — I824Y2 Acute embolism and thrombosis of unspecified deep veins of left proximal lower extremity: Secondary | ICD-10-CM

## 2018-01-03 DIAGNOSIS — Z818 Family history of other mental and behavioral disorders: Secondary | ICD-10-CM | POA: Diagnosis not present

## 2018-01-03 DIAGNOSIS — R5382 Chronic fatigue, unspecified: Secondary | ICD-10-CM | POA: Diagnosis not present

## 2018-01-03 DIAGNOSIS — R5383 Other fatigue: Secondary | ICD-10-CM | POA: Diagnosis not present

## 2018-01-03 DIAGNOSIS — Z823 Family history of stroke: Secondary | ICD-10-CM | POA: Diagnosis not present

## 2018-01-03 DIAGNOSIS — R000 Tachycardia, unspecified: Secondary | ICD-10-CM | POA: Diagnosis not present

## 2018-01-03 DIAGNOSIS — Z833 Family history of diabetes mellitus: Secondary | ICD-10-CM | POA: Diagnosis not present

## 2018-01-03 DIAGNOSIS — D696 Thrombocytopenia, unspecified: Secondary | ICD-10-CM | POA: Diagnosis not present

## 2018-01-03 LAB — COMPREHENSIVE METABOLIC PANEL
ALT: 29 U/L (ref 0–44)
AST: 20 U/L (ref 15–41)
Albumin: 4.5 g/dL (ref 3.5–5.0)
Alkaline Phosphatase: 39 U/L (ref 38–126)
Anion gap: 7 (ref 5–15)
BUN: 23 mg/dL — ABNORMAL HIGH (ref 6–20)
CO2: 23 mmol/L (ref 22–32)
Calcium: 9.9 mg/dL (ref 8.9–10.3)
Chloride: 103 mmol/L (ref 98–111)
Creatinine, Ser: 1.15 mg/dL (ref 0.61–1.24)
GFR calc Af Amer: >60 mL/min (ref >60)
GFR calc non Af Amer: >60 mL/min (ref >60)
Glucose, Bld: 116 mg/dL — ABNORMAL HIGH (ref 70–99)
Potassium: 4.1 mmol/L (ref 3.5–5.1)
Sodium: 133 mmol/L — ABNORMAL LOW (ref 135–145)
Total Bilirubin: 1 mg/dL (ref 0.3–1.2)
Total Protein: 7.2 g/dL (ref 6.5–8.1)

## 2018-01-03 LAB — CBC WITH DIFFERENTIAL/PLATELET
Abs Immature Granulocytes: 0.02 10*3/uL (ref 0.00–0.07)
Basophils Absolute: 0 10*3/uL (ref 0.0–0.1)
Basophils Relative: 1 %
Eosinophils Absolute: 0 10*3/uL (ref 0.0–0.5)
Eosinophils Relative: 2 %
HCT: 31.9 % — ABNORMAL LOW (ref 39.0–52.0)
Hemoglobin: 10.5 g/dL — ABNORMAL LOW (ref 13.0–17.0)
Immature Granulocytes: 1 %
Lymphocytes Relative: 9 %
Lymphs Abs: 0.2 10*3/uL — ABNORMAL LOW (ref 0.7–4.0)
MCH: 32 pg (ref 26.0–34.0)
MCHC: 32.9 g/dL (ref 30.0–36.0)
MCV: 97.3 fL (ref 80.0–100.0)
Monocytes Absolute: 0 10*3/uL — ABNORMAL LOW (ref 0.1–1.0)
Monocytes Relative: 1 %
Neutro Abs: 1.7 10*3/uL (ref 1.7–7.7)
Neutrophils Relative %: 86 %
Platelets: 134 10*3/uL — ABNORMAL LOW (ref 150–400)
RBC: 3.28 MIL/uL — ABNORMAL LOW (ref 4.22–5.81)
RDW: 15.4 % (ref 11.5–15.5)
WBC: 2 10*3/uL — ABNORMAL LOW (ref 4.0–10.5)
nRBC: 0 % (ref 0.0–0.2)

## 2018-01-03 LAB — MAGNESIUM: Magnesium: 2 mg/dL (ref 1.7–2.4)

## 2018-01-03 MED ORDER — FILGRASTIM-SNDZ 480 MCG/0.8ML IJ SOSY
480.0000 ug | PREFILLED_SYRINGE | Freq: Once | INTRAMUSCULAR | Status: AC
  Administered 2018-01-03: 480 ug via INTRAMUSCULAR
  Filled 2018-01-03: qty 0.8

## 2018-01-03 NOTE — Telephone Encounter (Signed)
-----   Message from Karen Kitchens, NP sent at 12/31/2017  4:15 PM EDT ----- We need to confirm appt with Edmonia Lynch, MD for his upcoming appt. He is inpatient right now.   Plans are for him to be seen next week, but he has no idea when.   BG

## 2018-01-03 NOTE — Progress Notes (Signed)
[facility] day:  01/03/2018  Chief Complaint: Edgar [name] is a 27 y.o. male with recurrent testicular cancer who is seen for assessment on day 8 of cycle #4 TIP chemotherapy.  HPI: The patient was last seen in the medical oncology clinic on 12/27/2017.  At that time, patient was doing well overall. He noted improved energy. Pain in LEFT calf and RIGHT foot persisted. He complained of minor neuropathy in the tips of his fingers. He remained on chronic steroid therapy (prednisone 20 mg/day).  WBC 11,300 (ANC 10,000). Calcium 10.5 mg/dL. Exam revealed an improved calf wound (neutrophilic dermatosis).   Patient was admitted to oncology unit at [facility] from 12/27/2017 - 01/01/2018.  Notes from hospital course reviewed.  Patient uneventfully received cycle #4 TIP chemotherapy.  Labs on the day of discharge revealed a WBC of 5100 ([place] 4800).  Hemoglobin 11.0, hematocrit 34.5, MCV 99.1, and platelets 191,000.  BUN 22 and creatinine 0.72.  LFTs are normal.  Magnesium normal at 2.2 mg/dL.  Phosphorus normal at 4.4 mg/dL.  UA was negative for blood.  Patient discharged home to follow-up with medical oncology as an outpatient.  Patient contacted the clinic on 01/01/2018 to advise that he needed a refill on his clonazepam 0.5 mg BID PRN.  Patient previously had discussed with his attending oncologist Mike Gip, MD) in the inpatient setting, she was amenable to refilling the medication.  Medication refill was not sent in. Patient was advised that refill for clonazepam 0.5 mg #30 tablets would be sent to his pharmacy. Prescription was e-scribed. In review of his discharge orders and prescriptions, it was appreciated that patient was also given a paper prescription for oxycodone and clonazepam by discharging hospitalist at the time of his discharge on Saturday.  In the interim, patient has been doing well overall.  He notes that he "feels like he always does after chemo".   Patient is fatigued.  He denies nausea and vomiting, however has been having more soft stools since discharge.  Patient denies fevers and sweats. Tender to the dorsal aspect of his RIGHT foot has resolved. Patient has persistent swelling in his BILATERAL lower extremities.   He continues scheduled wound care twice a day to LEFT lower extremity (calf) wound.  Patient continues on chronic steroid therapy (prednisone 20 mg/day) as recommended by dermatology.  Patient is scheduled to see Dr. 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   Patient advises that he maintains an labile appetite. He is eating less citing that things have "that after chemo metallic taste". Weight today is 255 lb 14.4 oz (116.1 kg), which compared to his last visit to the clinic, represents a 4 pound increase.  Patient complains of pain 3/10 in the clinic today.  Past Medical History:  Diagnosis Date  . Asthma    AS A CHILD-NO INHALERS  . Cancer ([place])    testicular cancer 11/2016 per pt   . DVT (deep venous thrombosis) ([place])   . GERD (gastroesophageal reflux disease)    TUMS PRN    Past Surgical History:  Procedure Laterality Date  . ANKLE FRACTURE SURGERY Right 2004  . FINE NEEDLE ASPIRATION BIOPSY N/A 11/09/2017   Procedure: FINE NEEDLE ASPIRATION BIOPSY;  Surgeon: [name], MD;  Location: [place] [facility];  Service: Cardiovascular;  Laterality: N/A;  . IVC FILTER INSERTION    . ORCHIECTOMY Left 11/26/2015   Procedure:  ORCHIECTOMY;  Surgeon: [name], MD;  Location: [facility] OR[facility];  Service: Urology;  Laterality: Left;  radical/Inguinal approach  . PERIPHERAL VASCULAR CATHETERIZATION N/A 12/16/2015   Procedure: [name] Cath Insertion;  Surgeon: [name], MD;  Location: [place] [facility];  Service: Cardiovascular;  Laterality: N/A;  . PERIPHERAL VASCULAR THROMBECTOMY Left 10/13/2017   Procedure: PERIPHERAL VASCULAR  THROMBECTOMY;  Surgeon: [name], MD;  Location: [place] [facility];  Service: Cardiovascular;  Laterality: Left;  . WISDOM TOOTH EXTRACTION  2017    Family History  Problem Relation Age of Onset  . Skin cancer Mother   . Breast cancer Maternal Grandmother   . Colon cancer Maternal Grandfather   . Diabetes Maternal Grandfather   . Emphysema Father   . Mental illness Sister        anxiety  . Stroke Paternal Grandmother   . Prostate cancer Neg Hx   . Kidney cancer Neg Hx   . Bladder Cancer Neg Hx     Social History:  reports that he has been smoking cigarettes. He has a 4.00 pack-year smoking history. He has quit using smokeless tobacco.  His smokeless tobacco use included snuff. He reports that he has current or past drug history. Drug: Marijuana. He reports that he does not drink alcohol.  He smokes 1/2 pack a day.  He smokes marijuana.  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  He has a 14 year-old-daughter named [name]. The patient's mother's cell phone number is [phone].  Another contact number is [phone].  The patient lives in [place]. He is accompanied by his parents today.   Allergies: No Known Allergies  Current Medications: Current Outpatient Medications  Medication Sig Dispense Refill  . gabapentin (NEURONTIN) 300 MG capsule Take 300 mg by mouth at bedtime.    . magnesium oxide (MAG-OX) 400 MG tablet Take 400 mg by mouth daily.    Marland Kitchen omeprazole (PRILOSEC) 20 MG capsule Take 1 capsule (20 mg total) by mouth daily. 30 capsule 1  . ondansetron (ZOFRAN) 4 MG tablet Take 4 mg by mouth every 8 (eight) hours as needed for nausea or vomiting.    . ondansetron (ZOFRAN) 8 MG tablet Take 8 mg by mouth every 8 (eight) hours as needed for nausea or vomiting.    . Oxycodone HCl 10 MG TABS Take 1 tablet (10 mg total) by mouth every 8 (eight) hours as needed. 30 tablet 0  . potassium chloride (K-DUR) 10 MEQ tablet Take 10 mEq by mouth 2 (two) times  daily.    . predniSONE (DELTASONE) 10 MG tablet Take 3 tablets (30 mg total) by mouth 2 (two) times daily with a meal. (Patient taking differently: Take 20 mg by mouth daily. ) 50 tablet 0  . XARELTO 20 MG TABS tablet Take 1 tablet by mouth daily.    . clobetasol ointment (TEMOVATE) 0.05 % Apply topically 1-2 times a day to the area on your left calf with bandage changes    . clonazePAM (KLONOPIN) 0.5 MG disintegrating tablet Take 1 tablet (0.5 mg total) by mouth 2 (two) times daily as needed (anxiety). (Patient not taking: Reported on 01/03/2018) 30 tablet 0   No current facility-administered medications for this visit.    Facility-Administered Medications Ordered in Other Visits  Medication Dose Route Frequency Provider Last Rate Last Dose  . heparin lock flush 100 unit/mL  500 Units Intravenous Once [name], MD      .  heparin lock flush 100 unit/mL  500 Units Intravenous Once Corcoran, [name], MD      . sodium chloride flush (NS) 0.9 % injection 10 mL  10 mL Intravenous PRN [name], MD   10 mL at 10/21/17 1008    Review of Systems  Constitutional: Positive for malaise/fatigue. Negative for diaphoresis, fever and weight loss (up 4 pounds).       "I feel like I always do after chemo".   HENT: Negative.        Dysgeusia  Eyes: Negative.   Respiratory: Negative for cough, hemoptysis, sputum production and shortness of breath.   Cardiovascular: Negative for chest pain, palpitations, orthopnea, leg swelling and PND.       TACHYcardia  Gastrointestinal: Negative for abdominal pain, blood in stool, constipation, diarrhea, melena, nausea and vomiting.       Loose stools  Genitourinary: Negative for dysuria, frequency, hematuria and urgency.  Musculoskeletal: Negative for back pain, falls, joint pain and myalgias.  Skin: Negative for itching and rash.       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   Neurological: Positive for sensory change (tips of finders;  stable). Negative for dizziness, tremors, weakness and headaches.  Endo/Heme/Allergies: Does not bruise/bleed easily.  Psychiatric/Behavioral: Negative for depression, memory loss and suicidal ideas. The patient is nervous/anxious and has insomnia (steroid induced).   All other systems reviewed and are negative.  Performance status (ECOG): 1 - 2  Vital Signs BP (!) 138/99 (BP Location: Left Arm, Patient Position: Sitting)   Pulse (!) 128   Temp 97.7 F (36.5 C) (Tympanic)   Resp 18   Ht 6' 3"  (1.905 m)   Wt 255 lb 14.4 oz (116.1 kg)   SpO2 99%   BMI 31.99 kg/m   Physical Exam  Constitutional: He is oriented to person, place, and time and well-developed, well-nourished, and in no distress.  HENT:  Head: Normocephalic and atraumatic. Hair is abnormal (chemotherapy induced alopecia).  Mouth/Throat: Oropharynx is clear and moist and mucous membranes are normal. Abnormal dentition (poor).  Eyes: Pupils are equal, round, and reactive to light. EOM are normal. No scleral icterus.  Brown eyes  Neck: Normal range of motion. Neck supple. No tracheal deviation present. No thyromegaly present.  Cardiovascular: Regular rhythm, normal heart sounds and intact distal pulses. Tachycardia present. Exam reveals no gallop and no friction rub.  No murmur heard. Pulmonary/Chest: Effort normal and breath sounds normal. No respiratory distress. He has no wheezes. He has no rales.  Abdominal: Soft. Bowel sounds are normal. He exhibits no distension. There is no tenderness.  Musculoskeletal: Normal range of motion. He exhibits edema (BLE). He exhibits no tenderness.  Lymphadenopathy:    He has no cervical adenopathy.    He has no axillary adenopathy.       Right: No inguinal and no supraclavicular adenopathy present.       Left: No inguinal and no supraclavicular adenopathy present.  Neurological: He is alert and oriented to person, place, and time.  Skin: Skin is warm and dry. No rash noted. No  erythema.     Psychiatric: Mood, affect and judgment normal.  Nursing note and vitals reviewed.   Medical photography: Lesion to posterior aspect of LEFT lower extremity                 12/27/2017                     12/20/2017  12/17/2017                    Imaging studies: 11/28/2015:  Chest, abdomen, and pelvic CT revealed no mediastinal adenopathy or suspicious pulmonary nodules.   There were 2 prominent left periaortic retroperitoneal lymph nodes (1.7 cm and 0.9 cm) concerning for testicular nodal metastasis.  There were no abnormal lymph nodes above the renal veins.  There were postsurgical change in the left hemiscrotum and left inguinal canal.  There was a 3.3 cm soft tissue abnormality anterior to the left iliopsoas muscle which could represent a metastatic lymph node (N2 lesion).  12/17/2015:  Head MRI revealed no evidence of metastatic disease.   03/30/2016:  Chest, abdomen, and pelvic CT revealed interval resolution of left abdominal/pelvic lymphadenopathy.  There was no residual metastatic disease. 09/30/2016:  Chest, abdomen, and pelvic CT revealed no evidence of recurrent or metastatic disease.  09/10/2017:  Bilateral lower extremity duplex revealed evidence of obstruction in the left CFV, SFJ, and proximal femoral vein.  09/10/2017:  CT pelvic venogram revealed multiple retroperitoneal masses including large mass within the left iliacus concerning for metastatic disease in the setting of prior testicular cancer.  Mass in the left iliacus caused significant narrowing of the proximal left external iliac vein. There was filling defect within the distal left external iliac vein extending into the common femoral and proximal superficial femoral veins consistent with thrombus  09/11/2017:  Chest, abdomen, and pelvic CT revealed enlarged and morphologically abnormal 4.3 x 4.9 cm left pelvic sidewall and retroperitoneal lymph nodes (measuring up to 2.9 cm) concerning  for metastatic disease given history of testicular cancer. Biopsy was suggested for confirmation. Prominent left inguinal lymph nodes could be metastatic or reactive in nature.  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  There was anasarca/swelling of the left lower extremity.  There was no evidence of metastatic disease in the chest. 09/13/2017:  Head MRI revealed no evidence of metastatic disease. 11/17/2017:  Right foot MRI revealed artifact in the plantar soft tissues over the first metatarsal head likely representing metallic foreign bodies as seen on previous radiographs.  There was an area of increased T2 signal intensity in the plantar aspect of the first metatarsal head with focal enhancement may indicate an area of focal osteomyelitis.  There was no soft tissue abscess. 12/07/2017:  Abdomen and pelvic CTrevealed no new or progressive metastatic disease.There was no acute vascular abnormality. IVC filter and left iliac venous stent graft werein place. There was persistent postsurgical fat stranding throughout the leftinguinal region without fluid collection 12/08/2017:  Bilateral lower extremity duplex revealed new nonocclusive thrombus in the left common femoralveinalong the anterior wall measuring 3-5 mm in thickness. This wasnot flow limiting but is new since the prior study on 11/05/2017 and consistent with acute/subacute thrombus (old per Dr. Delana Meyer). There was no evidence of right lower extremity deep vein thrombosis   Admissions: He was admitted to [facility] from 10/08/2017 - 10/16/2017 with left lower extremity cellulitis.  He was treated with Cefepime and vancomycin.  Imaging revealed significant improvement in adenopathy with decreased pelvic sidewall disease and decreased bulk at aortic bifurcation.  He was felt to have venous congestion syndrome with skin lesions due to congestion.   He was admitted to [facility]  from 10/21/2017 - 10/26/2017 for inpatient chemotherapy (cycle #2 TIP).    He was admitted to [facility] from 11/05/2017 - 11/09/2017 for cellulitis.  He was developing similar  lower extremity skin lesions following his first cycle of chemotherapy.  Skin biopsy revealed diffuse neutrophilic dermatitis. Cultures were negative.  Xarelto was held secondary to thrombocytopenia (nadir platelet count 39,000).  He was treated with heparin.  Lower extremity duplex revealed no clot.  He was treated with Cefepime and vancomycin.  He was discharged on Augmentin.  He received oral acyclovir for herpetic labialis.  He was admitted to [facility] from 11/22/2017 - 11/27/2017 for inpatient chemotherapy (cycle #3 TIP).    He was admitted to [facility] from 12/07/2017 - 12/11/2017 with fever and lower extremity edema.  He developed recurrent leg lesions. 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 He was diagnosed with Sweet's syndrome (felt due to [facility]) and began Solumedrol 80 mg IV q day on 12/09/2017.  He fevers quickly resolved.  He received 1 unit of PRBCs.  Platelets became > 50,000 on 12/11/2017 and Xarelto was restarted.  He was admitted to [facility] from 12/27/2017 - 01/01/2018 for inpatient chemotherapy (cycle #4 TIP).   Appointment on 01/03/2018  Component Date Value Ref Range Status  . WBC 01/03/2018 2.0* 4.0 - 10.5 K/uL Final  . RBC 01/03/2018 3.28* 4.22 - 5.81 MIL/uL Final  . Hemoglobin 01/03/2018 10.5* 13.0 - 17.0 g/dL Final  . HCT 01/03/2018 31.9* 39.0 - 52.0 % Final  . MCV 01/03/2018 97.3  80.0 - 100.0 fL Final  . MCH 01/03/2018 32.0  26.0 - 34.0 pg Final  . MCHC 01/03/2018 32.9  30.0 - 36.0 g/dL Final  . RDW 01/03/2018 15.4  11.5 - 15.5 % Final  . Platelets 01/03/2018 134* 150 - 400 K/uL Final  . nRBC 01/03/2018 0.0  0.0 - 0.2 % Final  . Neutrophils Relative % 01/03/2018 86  % Final  . Neutro Abs 01/03/2018 1.7  1.7 - 7.7 K/uL Final  . Lymphocytes Relative 01/03/2018 9  % Final  . Lymphs Abs  01/03/2018 0.2* 0.7 - 4.0 K/uL Final  . Monocytes Relative 01/03/2018 1  % Final  . Monocytes Absolute 01/03/2018 0.0* 0.1 - 1.0 K/uL Final  . Eosinophils Relative 01/03/2018 2  % Final  . Eosinophils Absolute 01/03/2018 0.0  0.0 - 0.5 K/uL Final  . Basophils Relative 01/03/2018 1  % Final  . Basophils Absolute 01/03/2018 0.0  0.0 - 0.1 K/uL Final  . Smear Review 01/03/2018 SMEAR SCANNED DIFF CONFIRMED BY MANUAL   Final  . Immature Granulocytes 01/03/2018 1  % Final  . Abs Immature Granulocytes 01/03/2018 0.02  0.00 - 0.07 K/uL Final   Performed at [facility], [address], [place], [place] [address]  . Sodium 01/03/2018 133* 135 - 145 mmol/L Final  . Potassium 01/03/2018 4.1  3.5 - 5.1 mmol/L Final  . Chloride 01/03/2018 103  98 - 111 mmol/L Final  . CO2 01/03/2018 23  22 - 32 mmol/L Final  . Glucose, Bld 01/03/2018 116* 70 - 99 mg/dL Final  . BUN 01/03/2018 23* 6 - 20 mg/dL Final  . Creatinine, Ser 01/03/2018 1.15  0.61 - 1.24 mg/dL Final  . Calcium 01/03/2018 9.9  8.9 - 10.3 mg/dL Final  . Total Protein 01/03/2018 7.2  6.5 - 8.1 g/dL Final  . Albumin 01/03/2018 4.5  3.5 - 5.0 g/dL Final  . AST 01/03/2018 20  15 - 41 U/L Final  . ALT 01/03/2018 29  0 - 44 U/L Final  . Alkaline Phosphatase 01/03/2018 39  38 - 126 U/L Final  . Total Bilirubin 01/03/2018 1.0  0.3 - 1.2 mg/dL Final  .  GFR calc non Af Amer 01/03/2018 >60  >60 mL/min Final  . GFR calc Af Amer 01/03/2018 >60  >60 mL/min Final   Comment: (NOTE) The eGFR has been calculated using the CKD EPI equation. This calculation has not been validated in all clinical situations. eGFR's persistently <60 mL/min signify possible Chronic Kidney Disease.   Georgiann Hahn gap 01/03/2018 7  5 - 15 Final   Performed at [facility], [address] [place], [place] [address]  . Magnesium 01/03/2018 2.0  1.7 - 2.4 mg/dL Final   Performed at [facility], [address] [place], [place] [address]  No results displayed  because visit has over 200 results.      Assessment:  Edgar [name] is a 27 y.o. male with recurrent testicular cancer.  He presented with a left lower extremity DVT on 09/10/2017.  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  Clinical/pathologic stage is T1 N1-2 S0 M0.  He received 3 cycles of  BEP (12/30/2015 - 02/24/2016).  He received OnPro Neulasta with cycle #2 and #3.  He declined evaluation for retroperitoneal lymph node dissection (RPLND).  He declined sperm banking.  Pre surgery labs on 11/20/2015 revealed an AFP of 411.9, beta-HCG 2,669, and LDH 522 (121-224).    Tumor markers have been followed:   AFP was 411.9 (0-8.3) on 11/20/2015, 11.2 on 12/19/2015, 6.3 on 12/23/2015, 1.3 on 01/27/2016, 1.8 on 02/17/2016, 1.9 on 03/30/2016, 1.4 on 05/27/2016, 1.0 on 07/29/2016, 1.4 on 10/14/2016, 3.3 on 12/24/2016, 1 on 09/10/2017, 1 on 09/20/2017,  2.6 on 10/21/2017, 2.1 on 11/16/2017, and 2.1 on 12/20/2017.    Beta-HCG was 2,669 (0-3) on 11/20/2015, 2 on 12/19/2015, 1 on 12/23/2015, < 1 on 01/27/2016, < 1 on 02/17/2016, < 1 on 03/30/2016, < 1 on 05/27/2016,  < 1 on 07/29/2016, < 1 on 10/14/2016, and 3 on 12/24/2016, < 5 on 09/11/2017,  < 5 on 09/20/2017, < 1 on 10/21/2017, < 1 on 11/16/2017, and < 1 on 12/20/2017.  LDH was 522 (98-192) on 11/20/2015, 179 on 12/19/2015, 160 on 12/23/2015, 202 on 01/27/2016, 156 on 02/10/2016, 161 on 02/17/2016, 172 on 03/30/2016, 178 on 05/27/2016, 150 on 07/29/2016, 152 on 10/14/2016, 145 on 12/24/2016, 1557 on 09/11/2017, 916 on 09/20/2017, 144 on 10/21/2017, 158 on 11/16/2017, 203 on 12/20/2017, and 151 on 12/27/2017.  Chest, abdomen, and pelvic CT on 09/11/2017 revealed enlarged and morphologically abnormal 4.3 x 4.9 cm left pelvic sidewall  and retroperitoneal lymph nodes (measuring up to 2.9 cm).  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  There was anasarca/swelling of the left lower extremity.  There was no evidence of metastatic disease in the chest.  Head MRI on 09/13/2017 revealed no evidence of metastatic disease.   He has a left lower extremity DVT.  Bilateral lower extremity duplex on 09/10/2017 revealed evidence of obstruction in the left CFV, SFJ, and proximal femoral vein.  He is on Xarelto.  He is s/p cycle #3 TIP chemotherapy (09/21/2017 - 11/27/2017) with Margarette Canada support. Cycle #1 was administered at [facility]. Cycles #2 and 3 were administered at [facility].  Both cycles 1 and 2 were complicated by lower extremity skin lesions on recovery of counts.  Audiogram on 10/27/2017 revealed hearing within normal limits.  He underwent IVC filterplacement, percutaneous angioplastyand stent placementof the left common iliac vein and left external iliac vein on 10/13/2017.  He was discharged on Flagyl, doxycycline, and Levaquin.  Hypercoagulable work-up on 10/12/2017 negative for the following: Factor V Leiden, prothrombin gene mutation, lupus anticoagulant, anticardiolipin antibodies, beta-2 glycoprotein, protein C activity/antigen, protein S activity/antigen. ATIII was 54% (75-120) on heparin.  He has a history of right lower molar abscess s/p extraction on 11/19/2017.  He was treated with amoxicillin.  He stepped on a nail with his right foot.  He was treated with antibiotics for a puncture wound.  He has been followed by podiatry.  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  He is on prednisone 20 mg daily.  Symptomatically, patient is fatigued.  He denies any B symptoms.  No nausea or vomiting.  Patient notes some loose stools.  Pain is well controlled with currently prescribed medications.  Appetite decreased due to dysgeusia (metallic taste). LEFT  lower extremity wound continues to improve with twice daily wound care and chronic steroids (prednisone 20 mg daily).  Patient scheduled follow-up with dermatology at [facility] on 01/05/2018.  WBC 2000 ([place] 1700).  Hemoglobin 10.5, hematocrit 31.9, and platelets 134,000.   Plan: 1. Labs today: CBC with diff, BMP, Mg 2. Recurrent testicular cancer  Doing well overall following cycle #4 TIP chemotherapy.   Discuss blood counts.  Unable to receive LA-GCSF due to Sweet's syndrome.  Review plans for daily Zarxio x 14 days.  Discuss use of loratadine to prevent growth factor induced bone pain.   Review plans for post-treatment imaging to assess effectiveness of chemotherapy.   Discuss possible RPLD based on residual disease noted on imaging.  3. Sweet's syndrome  LEFT lower extremity wound continues to improved with BID wound care.   Continues on chronic steroid therapy (prednisone 20 mg/day).  Scheduled to follow-up with Dr. Tally Joe on 01/05/2018. 4. LEFT lower extremity DVT  Continues on chronic anticoagulation (rivaroxaban) as previously prescribed.   Discussed plans to follow CBC as an outpatient.  We rivaroxaban previously had to be held due to thrombocytopenia. 5. RIGHT foot tenderness (no puncture)  Dorsal aspect of the right foot nontender.  There are no overt signs of infection.  Patient freely able to move his toes.  Continue routine monitoring due to previous concern for osteomyelitis.  Continue scheduled follow-ups with podiatry Cleda Mccreedy, MD). 6. Electrolyte wasting  Potassium 4.1 and magnesium 2.0.  Continue magnesium and potassium supplementation as previously prescribed.  Continue routine lab monitoring. 7. Chemotherapy-induced neuropathy  Stable neuropathy in his fingertips only.   He notes that his neuropathy does not impose any functional limitations.  Continue routine monitoring for symptom progression. 8. Pain and symptom management Patient has  antiemetics and pain medications at home to use on a PRN basis. Patient  advising that the  prescribed interventions are adequate at this point. Continue all medications as previously prescribed.  Followed up on clonazepam refills, as [place] indicated that on the oxycodone prescription from recent admission had been filled. Patient states, "oh the pharmacy told me that I can't get the klonopin until 01/10/2018".  9. RTC daily ([place]) for Zarxio injections x 13 days (weekends included).  10. RTC on 01/07/2018 for labs (CBC with differential) and Zarxio injection. 11. RTC on 01/12/2018 for MD assessment, labs (CBC with differential, CMP, Mg), and Zarxio injection.   Honor Loh, NP  01/03/2018, 11:37 AM

## 2018-01-04 ENCOUNTER — Inpatient Hospital Stay: Payer: BLUE CROSS/BLUE SHIELD

## 2018-01-04 DIAGNOSIS — R1032 Left lower quadrant pain: Secondary | ICD-10-CM | POA: Diagnosis not present

## 2018-01-04 DIAGNOSIS — R112 Nausea with vomiting, unspecified: Secondary | ICD-10-CM | POA: Diagnosis not present

## 2018-01-04 DIAGNOSIS — K219 Gastro-esophageal reflux disease without esophagitis: Secondary | ICD-10-CM | POA: Diagnosis not present

## 2018-01-04 DIAGNOSIS — Z7952 Long term (current) use of systemic steroids: Secondary | ICD-10-CM | POA: Diagnosis not present

## 2018-01-04 DIAGNOSIS — C6212 Malignant neoplasm of descended left testis: Secondary | ICD-10-CM | POA: Diagnosis not present

## 2018-01-04 DIAGNOSIS — L982 Febrile neutrophilic dermatosis [Sweet]: Secondary | ICD-10-CM | POA: Diagnosis not present

## 2018-01-04 DIAGNOSIS — G620 Drug-induced polyneuropathy: Secondary | ICD-10-CM | POA: Diagnosis not present

## 2018-01-04 DIAGNOSIS — F17210 Nicotine dependence, cigarettes, uncomplicated: Secondary | ICD-10-CM | POA: Diagnosis not present

## 2018-01-04 DIAGNOSIS — Z7689 Persons encountering health services in other specified circumstances: Secondary | ICD-10-CM | POA: Diagnosis not present

## 2018-01-04 DIAGNOSIS — R609 Edema, unspecified: Secondary | ICD-10-CM | POA: Diagnosis not present

## 2018-01-04 DIAGNOSIS — J45909 Unspecified asthma, uncomplicated: Secondary | ICD-10-CM | POA: Diagnosis not present

## 2018-01-04 DIAGNOSIS — Z7901 Long term (current) use of anticoagulants: Secondary | ICD-10-CM | POA: Diagnosis not present

## 2018-01-04 DIAGNOSIS — D61810 Antineoplastic chemotherapy induced pancytopenia: Secondary | ICD-10-CM | POA: Diagnosis not present

## 2018-01-04 DIAGNOSIS — T451X5A Adverse effect of antineoplastic and immunosuppressive drugs, initial encounter: Secondary | ICD-10-CM | POA: Diagnosis not present

## 2018-01-04 DIAGNOSIS — Z86718 Personal history of other venous thrombosis and embolism: Secondary | ICD-10-CM | POA: Diagnosis not present

## 2018-01-04 DIAGNOSIS — Z79899 Other long term (current) drug therapy: Secondary | ICD-10-CM | POA: Diagnosis not present

## 2018-01-04 MED ORDER — FILGRASTIM-SNDZ 480 MCG/0.8ML IJ SOSY
480.0000 ug | PREFILLED_SYRINGE | Freq: Once | INTRAMUSCULAR | Status: AC
  Administered 2018-01-04: 480 ug via INTRAMUSCULAR
  Filled 2018-01-04 (×2): qty 480

## 2018-01-04 NOTE — Patient Instructions (Signed)
Filgrastim, G-CSF injection  What is this medicine?  FILGRASTIM, G-CSF (fil GRA stim) is a granulocyte colony-stimulating factor that stimulates the growth of neutrophils, a type of white blood cell (WBC) important in the body's fight against infection. It is used to reduce the incidence of fever and infection in patients with certain types of cancer who are receiving chemotherapy that affects the bone marrow, to stimulate blood cell production for removal of WBCs from the body prior to a bone marrow transplantation, to reduce the incidence of fever and infection in patients who have severe chronic neutropenia, and to improve survival outcomes following high-dose radiation exposure that is toxic to the bone marrow.  This medicine may be used for other purposes; ask your health care provider or pharmacist if you have questions.  COMMON BRAND NAME(S): Neupogen, Zarxio  What should I tell my health care provider before I take this medicine?  They need to know if you have any of these conditions:  -kidney disease  -latex allergy  -ongoing radiation therapy  -sickle cell disease  -an unusual or allergic reaction to filgrastim, pegfilgrastim, other medicines, foods, dyes, or preservatives  -pregnant or trying to get pregnant  -breast-feeding  How should I use this medicine?  This medicine is for injection under the skin or infusion into a vein. As an infusion into a vein, it is usually given by a health care professional in a hospital or clinic setting. If you get this medicine at home, you will be taught how to prepare and give this medicine. Refer to the Instructions for Use that come with your medication packaging. Use exactly as directed. Take your medicine at regular intervals. Do not take your medicine more often than directed.  It is important that you put your used needles and syringes in a special sharps container. Do not put them in a trash can. If you do not have a sharps container, call your pharmacist or  healthcare provider to get one.  Talk to your pediatrician regarding the use of this medicine in children. While this drug may be prescribed for children as young as 7 months for selected conditions, precautions do apply.  Overdosage: If you think you have taken too much of this medicine contact a poison control center or emergency room at once.  NOTE: This medicine is only for you. Do not share this medicine with others.  What if I miss a dose?  It is important not to miss your dose. Call your doctor or health care professional if you miss a dose.  What may interact with this medicine?  This medicine may interact with the following medications:  -medicines that may cause a release of neutrophils, such as lithium  This list may not describe all possible interactions. Give your health care provider a list of all the medicines, herbs, non-prescription drugs, or dietary supplements you use. Also tell them if you smoke, drink alcohol, or use illegal drugs. Some items may interact with your medicine.  What should I watch for while using this medicine?  You may need blood work done while you are taking this medicine.  What side effects may I notice from receiving this medicine?  Side effects that you should report to your doctor or health care professional as soon as possible:  -allergic reactions like skin rash, itching or hives, swelling of the face, lips, or tongue  -dizziness or feeling faint  -fever  -pain, redness, or irritation at site where injected  -pinpoint red spots   on the skin  -shortness of breath or breathing problems  -signs and symptoms of kidney injury like trouble passing urine, change in the amount of urine, or red or dark-brown urine  -stomach or side pain, or pain at the shoulder  -swelling  -tiredness  -  unusual bleeding or bruising  Side effects that usually do not require medical attention (report to your doctor or health care professional if they continue or are bothersome):  -bone  pain  -headache  -muscle pain  This list may not describe all possible side effects. Call your doctor for medical advice about side effects. You may report side effects to FDA at 1-800-FDA-1088.  Where should I keep my medicine?  Keep out of the reach of children.  Store in a refrigerator between 2 and 8 degrees C (36 and 46 degrees F). Do not freeze. Keep in carton to protect from light. Throw away this medicine if vials or syringes are left out of the refrigerator for more than 24 hours. Throw away any unused medicine after the expiration date.  NOTE: This sheet is a summary. It may not cover all possible information. If you have questions about this medicine, talk to your doctor, pharmacist, or health care provider.  © 2018 Elsevier/Gold Standard (2014-09-19 10:57:13)

## 2018-01-05 ENCOUNTER — Encounter: Payer: Self-pay | Admitting: Urgent Care

## 2018-01-05 ENCOUNTER — Inpatient Hospital Stay: Payer: BLUE CROSS/BLUE SHIELD

## 2018-01-05 DIAGNOSIS — L982 Febrile neutrophilic dermatosis [Sweet]: Secondary | ICD-10-CM | POA: Diagnosis not present

## 2018-01-05 DIAGNOSIS — R1032 Left lower quadrant pain: Secondary | ICD-10-CM | POA: Diagnosis not present

## 2018-01-05 DIAGNOSIS — C6212 Malignant neoplasm of descended left testis: Secondary | ICD-10-CM | POA: Diagnosis not present

## 2018-01-05 DIAGNOSIS — Z7901 Long term (current) use of anticoagulants: Secondary | ICD-10-CM | POA: Diagnosis not present

## 2018-01-05 DIAGNOSIS — Z7689 Persons encountering health services in other specified circumstances: Secondary | ICD-10-CM | POA: Diagnosis not present

## 2018-01-05 DIAGNOSIS — Z79899 Other long term (current) drug therapy: Secondary | ICD-10-CM | POA: Diagnosis not present

## 2018-01-05 DIAGNOSIS — R609 Edema, unspecified: Secondary | ICD-10-CM | POA: Diagnosis not present

## 2018-01-05 DIAGNOSIS — D61810 Antineoplastic chemotherapy induced pancytopenia: Secondary | ICD-10-CM | POA: Diagnosis not present

## 2018-01-05 DIAGNOSIS — Z7952 Long term (current) use of systemic steroids: Secondary | ICD-10-CM | POA: Diagnosis not present

## 2018-01-05 DIAGNOSIS — R112 Nausea with vomiting, unspecified: Secondary | ICD-10-CM | POA: Diagnosis not present

## 2018-01-05 DIAGNOSIS — F17210 Nicotine dependence, cigarettes, uncomplicated: Secondary | ICD-10-CM | POA: Diagnosis not present

## 2018-01-05 DIAGNOSIS — T451X5A Adverse effect of antineoplastic and immunosuppressive drugs, initial encounter: Secondary | ICD-10-CM | POA: Diagnosis not present

## 2018-01-05 DIAGNOSIS — Z86718 Personal history of other venous thrombosis and embolism: Secondary | ICD-10-CM | POA: Diagnosis not present

## 2018-01-05 DIAGNOSIS — J45909 Unspecified asthma, uncomplicated: Secondary | ICD-10-CM | POA: Diagnosis not present

## 2018-01-05 DIAGNOSIS — G620 Drug-induced polyneuropathy: Secondary | ICD-10-CM | POA: Diagnosis not present

## 2018-01-05 DIAGNOSIS — K219 Gastro-esophageal reflux disease without esophagitis: Secondary | ICD-10-CM | POA: Diagnosis not present

## 2018-01-05 DIAGNOSIS — L88 Pyoderma gangrenosum: Secondary | ICD-10-CM | POA: Diagnosis not present

## 2018-01-05 MED ORDER — FILGRASTIM-SNDZ 480 MCG/0.8ML IJ SOSY
480.0000 ug | PREFILLED_SYRINGE | Freq: Once | INTRAMUSCULAR | Status: AC
  Administered 2018-01-05: 480 ug via SUBCUTANEOUS
  Filled 2018-01-05: qty 0.8

## 2018-01-06 ENCOUNTER — Inpatient Hospital Stay: Payer: BLUE CROSS/BLUE SHIELD

## 2018-01-06 ENCOUNTER — Other Ambulatory Visit: Payer: Self-pay | Admitting: *Deleted

## 2018-01-06 DIAGNOSIS — Z79899 Other long term (current) drug therapy: Secondary | ICD-10-CM | POA: Diagnosis not present

## 2018-01-06 DIAGNOSIS — Z7689 Persons encountering health services in other specified circumstances: Secondary | ICD-10-CM | POA: Diagnosis not present

## 2018-01-06 DIAGNOSIS — F17210 Nicotine dependence, cigarettes, uncomplicated: Secondary | ICD-10-CM | POA: Diagnosis not present

## 2018-01-06 DIAGNOSIS — C6212 Malignant neoplasm of descended left testis: Secondary | ICD-10-CM | POA: Diagnosis not present

## 2018-01-06 DIAGNOSIS — D61810 Antineoplastic chemotherapy induced pancytopenia: Secondary | ICD-10-CM | POA: Diagnosis not present

## 2018-01-06 DIAGNOSIS — Z86718 Personal history of other venous thrombosis and embolism: Secondary | ICD-10-CM | POA: Diagnosis not present

## 2018-01-06 DIAGNOSIS — G620 Drug-induced polyneuropathy: Secondary | ICD-10-CM | POA: Diagnosis not present

## 2018-01-06 DIAGNOSIS — K219 Gastro-esophageal reflux disease without esophagitis: Secondary | ICD-10-CM | POA: Diagnosis not present

## 2018-01-06 DIAGNOSIS — Z7901 Long term (current) use of anticoagulants: Secondary | ICD-10-CM | POA: Diagnosis not present

## 2018-01-06 DIAGNOSIS — T451X5A Adverse effect of antineoplastic and immunosuppressive drugs, initial encounter: Secondary | ICD-10-CM | POA: Diagnosis not present

## 2018-01-06 DIAGNOSIS — R112 Nausea with vomiting, unspecified: Secondary | ICD-10-CM | POA: Diagnosis not present

## 2018-01-06 DIAGNOSIS — R1032 Left lower quadrant pain: Secondary | ICD-10-CM | POA: Diagnosis not present

## 2018-01-06 DIAGNOSIS — Z7952 Long term (current) use of systemic steroids: Secondary | ICD-10-CM | POA: Diagnosis not present

## 2018-01-06 DIAGNOSIS — J45909 Unspecified asthma, uncomplicated: Secondary | ICD-10-CM | POA: Diagnosis not present

## 2018-01-06 DIAGNOSIS — R609 Edema, unspecified: Secondary | ICD-10-CM | POA: Diagnosis not present

## 2018-01-06 DIAGNOSIS — L982 Febrile neutrophilic dermatosis [Sweet]: Secondary | ICD-10-CM | POA: Diagnosis not present

## 2018-01-06 MED ORDER — FILGRASTIM-SNDZ 480 MCG/0.8ML IJ SOSY
480.0000 ug | PREFILLED_SYRINGE | Freq: Once | INTRAMUSCULAR | Status: AC
  Administered 2018-01-06: 480 ug via SUBCUTANEOUS
  Filled 2018-01-06: qty 0.8

## 2018-01-07 ENCOUNTER — Inpatient Hospital Stay: Payer: BLUE CROSS/BLUE SHIELD

## 2018-01-07 ENCOUNTER — Telehealth: Payer: Self-pay | Admitting: *Deleted

## 2018-01-07 DIAGNOSIS — R609 Edema, unspecified: Secondary | ICD-10-CM | POA: Diagnosis not present

## 2018-01-07 DIAGNOSIS — F17210 Nicotine dependence, cigarettes, uncomplicated: Secondary | ICD-10-CM | POA: Diagnosis not present

## 2018-01-07 DIAGNOSIS — L982 Febrile neutrophilic dermatosis [Sweet]: Secondary | ICD-10-CM | POA: Diagnosis not present

## 2018-01-07 DIAGNOSIS — K219 Gastro-esophageal reflux disease without esophagitis: Secondary | ICD-10-CM | POA: Diagnosis not present

## 2018-01-07 DIAGNOSIS — Z7901 Long term (current) use of anticoagulants: Secondary | ICD-10-CM | POA: Diagnosis not present

## 2018-01-07 DIAGNOSIS — R1032 Left lower quadrant pain: Secondary | ICD-10-CM | POA: Diagnosis not present

## 2018-01-07 DIAGNOSIS — Z7689 Persons encountering health services in other specified circumstances: Secondary | ICD-10-CM | POA: Diagnosis not present

## 2018-01-07 DIAGNOSIS — Z7952 Long term (current) use of systemic steroids: Secondary | ICD-10-CM | POA: Diagnosis not present

## 2018-01-07 DIAGNOSIS — Z86718 Personal history of other venous thrombosis and embolism: Secondary | ICD-10-CM | POA: Diagnosis not present

## 2018-01-07 DIAGNOSIS — T451X5A Adverse effect of antineoplastic and immunosuppressive drugs, initial encounter: Secondary | ICD-10-CM | POA: Diagnosis not present

## 2018-01-07 DIAGNOSIS — C6212 Malignant neoplasm of descended left testis: Secondary | ICD-10-CM

## 2018-01-07 DIAGNOSIS — G620 Drug-induced polyneuropathy: Secondary | ICD-10-CM | POA: Diagnosis not present

## 2018-01-07 DIAGNOSIS — J45909 Unspecified asthma, uncomplicated: Secondary | ICD-10-CM | POA: Diagnosis not present

## 2018-01-07 DIAGNOSIS — Z79899 Other long term (current) drug therapy: Secondary | ICD-10-CM | POA: Diagnosis not present

## 2018-01-07 DIAGNOSIS — D61810 Antineoplastic chemotherapy induced pancytopenia: Secondary | ICD-10-CM | POA: Diagnosis not present

## 2018-01-07 DIAGNOSIS — R112 Nausea with vomiting, unspecified: Secondary | ICD-10-CM | POA: Diagnosis not present

## 2018-01-07 LAB — CBC WITH DIFFERENTIAL/PLATELET
Abs Immature Granulocytes: 0 10*3/uL (ref 0.00–0.07)
BASOS ABS: 0 10*3/uL (ref 0.0–0.1)
BASOS PCT: 0 %
EOS ABS: 0 10*3/uL (ref 0.0–0.5)
Eosinophils Relative: 0 %
HEMATOCRIT: 29.9 % — AB (ref 39.0–52.0)
Hemoglobin: 9.8 g/dL — ABNORMAL LOW (ref 13.0–17.0)
IMMATURE GRANULOCYTES: 0 %
LYMPHS ABS: 0.4 10*3/uL — AB (ref 0.7–4.0)
Lymphocytes Relative: 88 %
MCH: 32 pg (ref 26.0–34.0)
MCHC: 32.8 g/dL (ref 30.0–36.0)
MCV: 97.7 fL (ref 80.0–100.0)
Monocytes Absolute: 0 10*3/uL — ABNORMAL LOW (ref 0.1–1.0)
Monocytes Relative: 7 %
NRBC: 0 % (ref 0.0–0.2)
Neutro Abs: 0 10*3/uL — ABNORMAL LOW (ref 1.7–7.7)
Neutrophils Relative %: 5 %
PLATELETS: 29 10*3/uL — AB (ref 150–400)
RBC: 3.06 MIL/uL — AB (ref 4.22–5.81)
RDW: 14.8 % (ref 11.5–15.5)
WBC: 0.4 10*3/uL — AB (ref 4.0–10.5)

## 2018-01-07 LAB — COMPREHENSIVE METABOLIC PANEL
ALK PHOS: 41 U/L (ref 38–126)
ALT: 18 U/L (ref 0–44)
ANION GAP: 9 (ref 5–15)
AST: 13 U/L — ABNORMAL LOW (ref 15–41)
Albumin: 4.1 g/dL (ref 3.5–5.0)
BUN: 15 mg/dL (ref 6–20)
CALCIUM: 9.1 mg/dL (ref 8.9–10.3)
CO2: 23 mmol/L (ref 22–32)
Chloride: 106 mmol/L (ref 98–111)
Creatinine, Ser: 0.81 mg/dL (ref 0.61–1.24)
Glucose, Bld: 103 mg/dL — ABNORMAL HIGH (ref 70–99)
Potassium: 3.9 mmol/L (ref 3.5–5.1)
SODIUM: 138 mmol/L (ref 135–145)
TOTAL PROTEIN: 6.7 g/dL (ref 6.5–8.1)
Total Bilirubin: 0.4 mg/dL (ref 0.3–1.2)

## 2018-01-07 LAB — MAGNESIUM: Magnesium: 1.7 mg/dL (ref 1.7–2.4)

## 2018-01-07 MED ORDER — HEPARIN SOD (PORK) LOCK FLUSH 100 UNIT/ML IV SOLN
500.0000 [IU] | Freq: Once | INTRAVENOUS | Status: AC | PRN
  Administered 2018-01-07: 500 [IU]

## 2018-01-07 MED ORDER — SODIUM CHLORIDE 0.9% FLUSH
10.0000 mL | Freq: Once | INTRAVENOUS | Status: AC | PRN
  Administered 2018-01-07: 10 mL
  Filled 2018-01-07: qty 10

## 2018-01-07 MED ORDER — FILGRASTIM-SNDZ 480 MCG/0.8ML IJ SOSY
480.0000 ug | PREFILLED_SYRINGE | Freq: Once | INTRAMUSCULAR | Status: AC
  Administered 2018-01-07: 480 ug via SUBCUTANEOUS
  Filled 2018-01-07: qty 0.8

## 2018-01-07 NOTE — Progress Notes (Signed)
NP notified of preliminary SBC 0.4, platelets 31.  NP to call patient to get him to hold blood thinner.

## 2018-01-07 NOTE — Telephone Encounter (Addendum)
Called patient and LVM that patient should hold his Xarelto starting today until further notice.

## 2018-01-08 ENCOUNTER — Encounter: Payer: Self-pay | Admitting: Urgent Care

## 2018-01-08 ENCOUNTER — Ambulatory Visit: Payer: BLUE CROSS/BLUE SHIELD

## 2018-01-08 DIAGNOSIS — R112 Nausea with vomiting, unspecified: Secondary | ICD-10-CM | POA: Diagnosis not present

## 2018-01-08 DIAGNOSIS — F17210 Nicotine dependence, cigarettes, uncomplicated: Secondary | ICD-10-CM | POA: Diagnosis not present

## 2018-01-08 DIAGNOSIS — K219 Gastro-esophageal reflux disease without esophagitis: Secondary | ICD-10-CM | POA: Diagnosis not present

## 2018-01-08 DIAGNOSIS — T451X5A Adverse effect of antineoplastic and immunosuppressive drugs, initial encounter: Secondary | ICD-10-CM | POA: Diagnosis not present

## 2018-01-08 DIAGNOSIS — Z7901 Long term (current) use of anticoagulants: Secondary | ICD-10-CM | POA: Diagnosis not present

## 2018-01-08 DIAGNOSIS — G620 Drug-induced polyneuropathy: Secondary | ICD-10-CM | POA: Diagnosis not present

## 2018-01-08 DIAGNOSIS — Z79899 Other long term (current) drug therapy: Secondary | ICD-10-CM | POA: Diagnosis not present

## 2018-01-08 DIAGNOSIS — R609 Edema, unspecified: Secondary | ICD-10-CM | POA: Diagnosis not present

## 2018-01-08 DIAGNOSIS — R1032 Left lower quadrant pain: Secondary | ICD-10-CM | POA: Diagnosis not present

## 2018-01-08 DIAGNOSIS — L982 Febrile neutrophilic dermatosis [Sweet]: Secondary | ICD-10-CM | POA: Diagnosis not present

## 2018-01-08 DIAGNOSIS — Z7952 Long term (current) use of systemic steroids: Secondary | ICD-10-CM | POA: Diagnosis not present

## 2018-01-08 DIAGNOSIS — D61810 Antineoplastic chemotherapy induced pancytopenia: Secondary | ICD-10-CM | POA: Diagnosis not present

## 2018-01-08 DIAGNOSIS — Z7689 Persons encountering health services in other specified circumstances: Secondary | ICD-10-CM | POA: Diagnosis not present

## 2018-01-08 DIAGNOSIS — J45909 Unspecified asthma, uncomplicated: Secondary | ICD-10-CM | POA: Diagnosis not present

## 2018-01-08 DIAGNOSIS — C6212 Malignant neoplasm of descended left testis: Secondary | ICD-10-CM | POA: Diagnosis not present

## 2018-01-08 DIAGNOSIS — Z86718 Personal history of other venous thrombosis and embolism: Secondary | ICD-10-CM | POA: Diagnosis not present

## 2018-01-08 MED ORDER — FILGRASTIM-SNDZ 480 MCG/0.8ML IJ SOSY
480.0000 ug | PREFILLED_SYRINGE | Freq: Once | INTRAMUSCULAR | Status: DC
  Administered 2018-01-08: 480 ug via INTRAMUSCULAR

## 2018-01-08 MED ORDER — LORATADINE 10 MG PO TABS: 10.0000 mg | ORAL_TABLET | Freq: Every day | ORAL | 1 refills | Status: DC

## 2018-01-08 MED ORDER — OXYCODONE HCL 10 MG PO TABS: 10.0000 mg | ORAL_TABLET | Freq: Three times a day (TID) | ORAL | 0 refills | Status: DC | PRN

## 2018-01-08 NOTE — Progress Notes (Signed)
Narcotic refill request: 01/08/18   [place] CSRS database reviewed prior to consideration of refills:  NARX scores --    Narcotic: 541  Sedative: 442  30 day average MME/day: 43.50  30 day average LME/day: 1.93  ORS score (range 0-999): 550  Providers outside of this practice prescribing narcotics/sedatives in the last 90 days: YES    REQUESTING REFILL EARLY  Last oxycodone refill was on 01/01/2018 for #30 (10 day supply).  Given a current oncology diagnosis (recurrent testicular cancer), this patient has the potential to experience significant cancer related pain. 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 He is receiving daily Zarxio injections, which has caused increased bone pain. Benefits versus risks associated with continued therapy considered. Will continue pain management with opioids as previously prescribed. He has required extra dosing (authorized) in order to control his pain, hence the reason that he is at risk of exhausting his supply early. He has fears of being in pain, while trying to maintain normalcy in his life by participating in pleasure activities and the care of his daughter.  Medication safety precautions reviewed, as patient is on concurrent therapy using a BZO (clonazepam).  Patient verbalized understanding that medications should not be sold or shared, taken with alcohol, or used while driving. Patient educated that medications should not be bitten, chewed, or crushed. He has been made aware of the side effects of using these medications. Patient understands that these medications can cause CNS depression, increase his risk of falls, and even lead to overdose that may result in death, if used outside of the parameters that he and I discussed. He has been asked to stagger opioid and BZO to prevent oversedation.   With all of this in mind, he accepts the risks and responsibilities associated with therapy  and elects to continue to use the prescribed interventions.   Refill prescription sent in for: 1. Oxycodone 10 mg q8h PRN (Disp #15)  Honor Loh, MSN, APRN, FNP-C, CEN Oncology/Hematology Nurse Practitioner  [facility] 01/08/18 4:31 PM

## 2018-01-09 ENCOUNTER — Ambulatory Visit: Payer: BLUE CROSS/BLUE SHIELD

## 2018-01-09 ENCOUNTER — Encounter: Payer: Self-pay | Admitting: Urgent Care

## 2018-01-09 ENCOUNTER — Telehealth: Payer: Self-pay | Admitting: Urgent Care

## 2018-01-09 DIAGNOSIS — Z86718 Personal history of other venous thrombosis and embolism: Secondary | ICD-10-CM | POA: Diagnosis not present

## 2018-01-09 DIAGNOSIS — Z7952 Long term (current) use of systemic steroids: Secondary | ICD-10-CM | POA: Diagnosis not present

## 2018-01-09 DIAGNOSIS — T451X5A Adverse effect of antineoplastic and immunosuppressive drugs, initial encounter: Secondary | ICD-10-CM | POA: Diagnosis not present

## 2018-01-09 DIAGNOSIS — L982 Febrile neutrophilic dermatosis [Sweet]: Secondary | ICD-10-CM | POA: Diagnosis not present

## 2018-01-09 DIAGNOSIS — Z79899 Other long term (current) drug therapy: Secondary | ICD-10-CM | POA: Diagnosis not present

## 2018-01-09 DIAGNOSIS — G620 Drug-induced polyneuropathy: Secondary | ICD-10-CM | POA: Diagnosis not present

## 2018-01-09 DIAGNOSIS — D61810 Antineoplastic chemotherapy induced pancytopenia: Secondary | ICD-10-CM | POA: Diagnosis not present

## 2018-01-09 DIAGNOSIS — R1032 Left lower quadrant pain: Secondary | ICD-10-CM | POA: Diagnosis not present

## 2018-01-09 DIAGNOSIS — C6212 Malignant neoplasm of descended left testis: Secondary | ICD-10-CM | POA: Diagnosis not present

## 2018-01-09 DIAGNOSIS — Z7901 Long term (current) use of anticoagulants: Secondary | ICD-10-CM | POA: Diagnosis not present

## 2018-01-09 DIAGNOSIS — F17210 Nicotine dependence, cigarettes, uncomplicated: Secondary | ICD-10-CM | POA: Diagnosis not present

## 2018-01-09 DIAGNOSIS — K219 Gastro-esophageal reflux disease without esophagitis: Secondary | ICD-10-CM | POA: Diagnosis not present

## 2018-01-09 DIAGNOSIS — R609 Edema, unspecified: Secondary | ICD-10-CM | POA: Diagnosis not present

## 2018-01-09 DIAGNOSIS — R112 Nausea with vomiting, unspecified: Secondary | ICD-10-CM | POA: Diagnosis not present

## 2018-01-09 DIAGNOSIS — J45909 Unspecified asthma, uncomplicated: Secondary | ICD-10-CM | POA: Diagnosis not present

## 2018-01-09 DIAGNOSIS — Z7689 Persons encountering health services in other specified circumstances: Secondary | ICD-10-CM | POA: Diagnosis not present

## 2018-01-09 MED ORDER — OXYCODONE HCL 10 MG PO TABS: 10.0000 mg | ORAL_TABLET | Freq: Three times a day (TID) | ORAL | 0 refills | Status: DC | PRN

## 2018-01-09 MED ORDER — FILGRASTIM-SNDZ 480 MCG/0.8ML IJ SOSY
480.0000 ug | PREFILLED_SYRINGE | Freq: Once | INTRAMUSCULAR | Status: DC
  Administered 2018-01-09: 480 ug via INTRAMUSCULAR

## 2018-01-09 NOTE — Telephone Encounter (Signed)
Re: Medication (oxicodone) refill  Received MyChart message from patient to advise that his refill for his pain medication was not available at his pharmacy. Rx was sent for oxycodone 10 mg q8h PRN (Disp #15) on 01/08/2018.  Call placed to [facility] in [place], [place] to confirm details of patient's message. Spoke with pharmacy staff and was advised that intermittent issues with their e-scribe system on 01/08/2018 likely prevented the Rx from being received. They checked the system and advised that there was NO ACTIVE oxycodone Rx available for this patient.   Rx resent as follows:    Followed up with pharmacy to ensure receipt, which was confirmed. I then spoke with the patient to make him aware that his Rx should be available shortly.  Honor Loh, MSN, APRN, FNP-C, CEN Oncology/Hematology Nurse Practitioner  [facility] 01/09/18, 11:51 AM

## 2018-01-09 NOTE — Progress Notes (Signed)
I reviewed the lab results from Friday with the patient yesterday, 01/08/18, and made sure that he knew not to take his blood thinner.  He did, in fact, take the blood thinner on the 26th but he verbalized that he would not take any more.

## 2018-01-10 ENCOUNTER — Other Ambulatory Visit: Payer: Self-pay | Admitting: Urgent Care

## 2018-01-10 ENCOUNTER — Inpatient Hospital Stay: Payer: BLUE CROSS/BLUE SHIELD

## 2018-01-10 DIAGNOSIS — D6481 Anemia due to antineoplastic chemotherapy: Secondary | ICD-10-CM

## 2018-01-10 DIAGNOSIS — Z95828 Presence of other vascular implants and grafts: Secondary | ICD-10-CM | POA: Diagnosis not present

## 2018-01-10 DIAGNOSIS — E876 Hypokalemia: Secondary | ICD-10-CM | POA: Diagnosis not present

## 2018-01-10 DIAGNOSIS — Z86718 Personal history of other venous thrombosis and embolism: Secondary | ICD-10-CM | POA: Diagnosis not present

## 2018-01-10 DIAGNOSIS — F17210 Nicotine dependence, cigarettes, uncomplicated: Secondary | ICD-10-CM | POA: Diagnosis not present

## 2018-01-10 DIAGNOSIS — D6959 Other secondary thrombocytopenia: Secondary | ICD-10-CM | POA: Diagnosis not present

## 2018-01-10 DIAGNOSIS — R000 Tachycardia, unspecified: Secondary | ICD-10-CM | POA: Diagnosis not present

## 2018-01-10 DIAGNOSIS — G8929 Other chronic pain: Secondary | ICD-10-CM | POA: Diagnosis not present

## 2018-01-10 DIAGNOSIS — T451X5A Adverse effect of antineoplastic and immunosuppressive drugs, initial encounter: Secondary | ICD-10-CM | POA: Diagnosis not present

## 2018-01-10 DIAGNOSIS — G629 Polyneuropathy, unspecified: Secondary | ICD-10-CM | POA: Diagnosis not present

## 2018-01-10 DIAGNOSIS — R5382 Chronic fatigue, unspecified: Secondary | ICD-10-CM | POA: Diagnosis not present

## 2018-01-10 DIAGNOSIS — D701 Agranulocytosis secondary to cancer chemotherapy: Secondary | ICD-10-CM | POA: Diagnosis not present

## 2018-01-10 DIAGNOSIS — K219 Gastro-esophageal reflux disease without esophagitis: Secondary | ICD-10-CM | POA: Diagnosis not present

## 2018-01-10 DIAGNOSIS — D696 Thrombocytopenia, unspecified: Secondary | ICD-10-CM | POA: Diagnosis not present

## 2018-01-10 DIAGNOSIS — L982 Febrile neutrophilic dermatosis [Sweet]: Secondary | ICD-10-CM | POA: Diagnosis not present

## 2018-01-10 DIAGNOSIS — C6292 Malignant neoplasm of left testis, unspecified whether descended or undescended: Secondary | ICD-10-CM | POA: Diagnosis not present

## 2018-01-10 DIAGNOSIS — C6212 Malignant neoplasm of descended left testis: Secondary | ICD-10-CM

## 2018-01-10 DIAGNOSIS — Z9079 Acquired absence of other genital organ(s): Secondary | ICD-10-CM | POA: Diagnosis not present

## 2018-01-10 MED ORDER — FILGRASTIM-SNDZ 480 MCG/0.8ML IJ SOSY: 480.0000 ug | PREFILLED_SYRINGE | Freq: Once | INTRAMUSCULAR | Status: AC

## 2018-01-10 MED ORDER — FILGRASTIM-SNDZ 480 MCG/0.8ML IJ SOSY
480.0000 ug | PREFILLED_SYRINGE | Freq: Once | INTRAMUSCULAR | Status: AC
  Administered 2018-01-10: 480 ug via SUBCUTANEOUS
  Filled 2018-01-10: qty 0.8

## 2018-01-10 NOTE — Addendum Note (Signed)
Addended by: Charlyn Minerva on: 01/10/2018 08:05 AM   Modules accepted: Orders

## 2018-01-10 NOTE — Addendum Note (Signed)
Addended by: Charlyn Minerva on: 01/10/2018 08:06 AM   Modules accepted: Orders

## 2018-01-11 ENCOUNTER — Other Ambulatory Visit: Payer: Self-pay

## 2018-01-11 ENCOUNTER — Inpatient Hospital Stay: Payer: BLUE CROSS/BLUE SHIELD

## 2018-01-11 ENCOUNTER — Encounter: Payer: Self-pay | Admitting: Urgent Care

## 2018-01-11 ENCOUNTER — Other Ambulatory Visit: Payer: Self-pay | Admitting: *Deleted

## 2018-01-11 ENCOUNTER — Other Ambulatory Visit: Payer: Self-pay | Admitting: Hematology and Oncology

## 2018-01-11 ENCOUNTER — Other Ambulatory Visit: Payer: Self-pay | Admitting: Urgent Care

## 2018-01-11 ENCOUNTER — Telehealth: Payer: Self-pay | Admitting: Oncology

## 2018-01-11 ENCOUNTER — Inpatient Hospital Stay
Admission: EM | Admit: 2018-01-11 | Discharge: 2018-01-13 | DRG: 813 | Disposition: A | Discharge status: Home / Self Care | Payer: BLUE CROSS/BLUE SHIELD | Attending: Internal Medicine | Admitting: Internal Medicine

## 2018-01-11 ENCOUNTER — Encounter: Payer: Self-pay | Admitting: Emergency Medicine

## 2018-01-11 VITALS — BP 123/88 | HR 101 | Temp 98.8°F | Resp 18

## 2018-01-11 DIAGNOSIS — Z79899 Other long term (current) drug therapy: Secondary | ICD-10-CM | POA: Diagnosis not present

## 2018-01-11 DIAGNOSIS — C6212 Malignant neoplasm of descended left testis: Secondary | ICD-10-CM

## 2018-01-11 DIAGNOSIS — Z9079 Acquired absence of other genital organ(s): Secondary | ICD-10-CM | POA: Diagnosis not present

## 2018-01-11 DIAGNOSIS — G8929 Other chronic pain: Secondary | ICD-10-CM | POA: Diagnosis present

## 2018-01-11 DIAGNOSIS — Z8547 Personal history of malignant neoplasm of testis: Secondary | ICD-10-CM

## 2018-01-11 DIAGNOSIS — G629 Polyneuropathy, unspecified: Secondary | ICD-10-CM | POA: Diagnosis present

## 2018-01-11 DIAGNOSIS — D696 Thrombocytopenia, unspecified: Secondary | ICD-10-CM

## 2018-01-11 DIAGNOSIS — F17210 Nicotine dependence, cigarettes, uncomplicated: Secondary | ICD-10-CM | POA: Diagnosis present

## 2018-01-11 DIAGNOSIS — T451X5A Adverse effect of antineoplastic and immunosuppressive drugs, initial encounter: Secondary | ICD-10-CM | POA: Diagnosis present

## 2018-01-11 DIAGNOSIS — E876 Hypokalemia: Secondary | ICD-10-CM | POA: Diagnosis present

## 2018-01-11 DIAGNOSIS — K219 Gastro-esophageal reflux disease without esophagitis: Secondary | ICD-10-CM | POA: Diagnosis present

## 2018-01-11 DIAGNOSIS — D6959 Other secondary thrombocytopenia: Principal | ICD-10-CM | POA: Diagnosis present

## 2018-01-11 DIAGNOSIS — R000 Tachycardia, unspecified: Secondary | ICD-10-CM | POA: Diagnosis not present

## 2018-01-11 DIAGNOSIS — Z8619 Personal history of other infectious and parasitic diseases: Secondary | ICD-10-CM | POA: Diagnosis not present

## 2018-01-11 DIAGNOSIS — L97929 Non-pressure chronic ulcer of unspecified part of left lower leg with unspecified severity: Secondary | ICD-10-CM | POA: Diagnosis not present

## 2018-01-11 DIAGNOSIS — L982 Febrile neutrophilic dermatosis [Sweet]: Secondary | ICD-10-CM | POA: Diagnosis present

## 2018-01-11 DIAGNOSIS — Z803 Family history of malignant neoplasm of breast: Secondary | ICD-10-CM

## 2018-01-11 DIAGNOSIS — Z7952 Long term (current) use of systemic steroids: Secondary | ICD-10-CM

## 2018-01-11 DIAGNOSIS — Z7901 Long term (current) use of anticoagulants: Secondary | ICD-10-CM

## 2018-01-11 DIAGNOSIS — Z818 Family history of other mental and behavioral disorders: Secondary | ICD-10-CM

## 2018-01-11 DIAGNOSIS — D6481 Anemia due to antineoplastic chemotherapy: Secondary | ICD-10-CM

## 2018-01-11 DIAGNOSIS — D649 Anemia, unspecified: Secondary | ICD-10-CM | POA: Diagnosis not present

## 2018-01-11 DIAGNOSIS — Z800 Family history of malignant neoplasm of digestive organs: Secondary | ICD-10-CM

## 2018-01-11 DIAGNOSIS — R5382 Chronic fatigue, unspecified: Secondary | ICD-10-CM | POA: Diagnosis present

## 2018-01-11 DIAGNOSIS — Z86718 Personal history of other venous thrombosis and embolism: Secondary | ICD-10-CM | POA: Diagnosis not present

## 2018-01-11 DIAGNOSIS — Z8709 Personal history of other diseases of the respiratory system: Secondary | ICD-10-CM | POA: Diagnosis not present

## 2018-01-11 DIAGNOSIS — Z95828 Presence of other vascular implants and grafts: Secondary | ICD-10-CM

## 2018-01-11 DIAGNOSIS — D709 Neutropenia, unspecified: Secondary | ICD-10-CM | POA: Diagnosis not present

## 2018-01-11 DIAGNOSIS — Z823 Family history of stroke: Secondary | ICD-10-CM

## 2018-01-11 DIAGNOSIS — D701 Agranulocytosis secondary to cancer chemotherapy: Secondary | ICD-10-CM | POA: Diagnosis present

## 2018-01-11 DIAGNOSIS — R5383 Other fatigue: Secondary | ICD-10-CM | POA: Diagnosis not present

## 2018-01-11 DIAGNOSIS — Z833 Family history of diabetes mellitus: Secondary | ICD-10-CM | POA: Diagnosis not present

## 2018-01-11 DIAGNOSIS — C6292 Malignant neoplasm of left testis, unspecified whether descended or undescended: Secondary | ICD-10-CM | POA: Diagnosis present

## 2018-01-11 DIAGNOSIS — Z825 Family history of asthma and other chronic lower respiratory diseases: Secondary | ICD-10-CM

## 2018-01-11 DIAGNOSIS — C6290 Malignant neoplasm of unspecified testis, unspecified whether descended or undescended: Secondary | ICD-10-CM | POA: Diagnosis not present

## 2018-01-11 DIAGNOSIS — Z716 Tobacco abuse counseling: Secondary | ICD-10-CM | POA: Diagnosis not present

## 2018-01-11 DIAGNOSIS — Z808 Family history of malignant neoplasm of other organs or systems: Secondary | ICD-10-CM

## 2018-01-11 LAB — BASIC METABOLIC PANEL
Anion gap: 6 (ref 5–15)
BUN: 21 mg/dL — AB (ref 6–20)
CALCIUM: 8.7 mg/dL — AB (ref 8.9–10.3)
CO2: 28 mmol/L (ref 22–32)
CREATININE: 0.76 mg/dL (ref 0.61–1.24)
Chloride: 104 mmol/L (ref 98–111)
Glucose, Bld: 108 mg/dL — ABNORMAL HIGH (ref 70–99)
Potassium: 3.5 mmol/L (ref 3.5–5.1)
SODIUM: 138 mmol/L (ref 135–145)

## 2018-01-11 LAB — CBC WITH DIFFERENTIAL/PLATELET
ABS IMMATURE GRANULOCYTES: 0.06 10*3/uL (ref 0.00–0.07)
Basophils Absolute: 0 10*3/uL (ref 0.0–0.1)
Basophils Relative: 1 %
Eosinophils Absolute: 0 10*3/uL (ref 0.0–0.5)
Eosinophils Relative: 0 %
HEMATOCRIT: 25 % — AB (ref 39.0–52.0)
Hemoglobin: 8.2 g/dL — ABNORMAL LOW (ref 13.0–17.0)
IMMATURE GRANULOCYTES: 2 %
LYMPHS ABS: 0.8 10*3/uL (ref 0.7–4.0)
Lymphocytes Relative: 26 %
MCH: 32 pg (ref 26.0–34.0)
MCHC: 32.8 g/dL (ref 30.0–36.0)
MCV: 97.7 fL (ref 80.0–100.0)
MONOS PCT: 11 %
Monocytes Absolute: 0.3 10*3/uL (ref 0.1–1.0)
NEUTROS ABS: 1.9 10*3/uL (ref 1.7–7.7)
NEUTROS PCT: 60 %
Platelets: 6 10*3/uL — CL (ref 150–400)
RBC: 2.56 MIL/uL — ABNORMAL LOW (ref 4.22–5.81)
RDW: 14.6 % (ref 11.5–15.5)
WBC: 3.1 10*3/uL — ABNORMAL LOW (ref 4.0–10.5)
nRBC: 0.7 % — ABNORMAL HIGH (ref 0.0–0.2)

## 2018-01-11 LAB — TECHNOLOGIST SMEAR REVIEW

## 2018-01-11 MED ORDER — PANTOPRAZOLE SODIUM 40 MG PO TBEC
40.0000 mg | DELAYED_RELEASE_TABLET | Freq: Every day | ORAL | Status: DC
  Administered 2018-01-12 – 2018-01-13 (×2): 40 mg via ORAL
  Filled 2018-01-11 (×2): qty 1

## 2018-01-11 MED ORDER — ACETAMINOPHEN 325 MG PO TABS: 650.0000 mg | ORAL_TABLET | Freq: Four times a day (QID) | ORAL | Status: DC | PRN

## 2018-01-11 MED ORDER — HEPARIN SOD (PORK) LOCK FLUSH 100 UNIT/ML IV SOLN
500.0000 [IU] | Freq: Once | INTRAVENOUS | Status: AC
  Administered 2018-01-11: 500 [IU] via INTRAVENOUS

## 2018-01-11 MED ORDER — SODIUM CHLORIDE 0.9 % IV SOLN: 10.0000 mL/h | Freq: Once | INTRAVENOUS | Status: DC

## 2018-01-11 MED ORDER — OXYCODONE HCL 5 MG PO TABS
10.0000 mg | ORAL_TABLET | Freq: Three times a day (TID) | ORAL | Status: DC | PRN
  Administered 2018-01-11 – 2018-01-13 (×5): 10 mg via ORAL
  Filled 2018-01-11 (×4): qty 2

## 2018-01-11 MED ORDER — FILGRASTIM-SNDZ 480 MCG/0.8ML IJ SOSY
480.0000 ug | PREFILLED_SYRINGE | Freq: Once | INTRAMUSCULAR | Status: AC
  Administered 2018-01-11: 480 ug via SUBCUTANEOUS
  Filled 2018-01-11: qty 0.8

## 2018-01-11 MED ORDER — LORATADINE 10 MG PO TABS
10.0000 mg | ORAL_TABLET | Freq: Every day | ORAL | Status: DC
  Administered 2018-01-12 – 2018-01-13 (×2): 10 mg via ORAL
  Filled 2018-01-11 (×2): qty 1

## 2018-01-11 MED ORDER — POLYETHYLENE GLYCOL 3350 17 G PO PACK: 17.0000 g | PACK | Freq: Every day | ORAL | Status: DC | PRN

## 2018-01-11 MED ORDER — CLONAZEPAM 0.5 MG PO TABS
0.5000 mg | ORAL_TABLET | Freq: Two times a day (BID) | ORAL | Status: DC | PRN
  Administered 2018-01-11 – 2018-01-13 (×4): 0.5 mg via ORAL
  Filled 2018-01-11 (×3): qty 1

## 2018-01-11 MED ORDER — PREDNISONE 20 MG PO TABS
20.0000 mg | ORAL_TABLET | Freq: Every day | ORAL | Status: DC
  Administered 2018-01-13: 10:00:00 20 mg via ORAL
  Filled 2018-01-11 (×2): qty 1

## 2018-01-11 MED ORDER — SODIUM CHLORIDE 0.9% FLUSH
10.0000 mL | INTRAVENOUS | Status: DC | PRN
  Administered 2018-01-11: 10 mL via INTRAVENOUS
  Filled 2018-01-11: qty 10

## 2018-01-11 MED ORDER — MAGNESIUM OXIDE 400 (241.3 MG) MG PO TABS
400.0000 mg | ORAL_TABLET | Freq: Every day | ORAL | Status: DC
  Administered 2018-01-12: 400 mg via ORAL
  Filled 2018-01-11 (×2): qty 1

## 2018-01-11 MED ORDER — NICOTINE 21 MG/24HR TD PT24
21.0000 mg | MEDICATED_PATCH | Freq: Every day | TRANSDERMAL | Status: DC
  Filled 2018-01-11 (×2): qty 1

## 2018-01-11 MED ORDER — GABAPENTIN 600 MG PO TABS
ORAL_TABLET | ORAL | Status: AC
  Administered 2018-01-11: 20:00:00
  Filled 2018-01-11: qty 1

## 2018-01-11 MED ORDER — GABAPENTIN 300 MG PO CAPS
300.0000 mg | ORAL_CAPSULE | Freq: Every day | ORAL | Status: DC
  Administered 2018-01-11 – 2018-01-12 (×2): 300 mg via ORAL
  Filled 2018-01-11: qty 1

## 2018-01-11 MED ORDER — OXYCODONE HCL 5 MG PO TABS
ORAL_TABLET | ORAL | Status: AC
  Administered 2018-01-12: 10 mg via ORAL
  Filled 2018-01-11: qty 2

## 2018-01-11 MED ORDER — POTASSIUM CHLORIDE CRYS ER 10 MEQ PO TBCR
10.0000 meq | EXTENDED_RELEASE_TABLET | Freq: Two times a day (BID) | ORAL | Status: DC
  Administered 2018-01-12 – 2018-01-13 (×3): 10 meq via ORAL
  Filled 2018-01-11 (×3): qty 1

## 2018-01-11 MED ORDER — KETOROLAC TROMETHAMINE 30 MG/ML IJ SOLN
30.0000 mg | Freq: Four times a day (QID) | INTRAMUSCULAR | Status: DC | PRN
  Administered 2018-01-11 – 2018-01-12 (×2): 30 mg via INTRAVENOUS
  Filled 2018-01-11: qty 1

## 2018-01-11 MED ORDER — ONDANSETRON HCL 4 MG PO TABS: 4.0000 mg | ORAL_TABLET | Freq: Three times a day (TID) | ORAL | Status: DC | PRN

## 2018-01-11 MED ORDER — ONDANSETRON HCL 4 MG PO TABS: 8.0000 mg | ORAL_TABLET | Freq: Three times a day (TID) | ORAL | Status: DC | PRN

## 2018-01-11 MED ORDER — KETOROLAC TROMETHAMINE 30 MG/ML IJ SOLN
INTRAMUSCULAR | Status: AC
  Administered 2018-01-12: 05:00:00 30 mg via INTRAVENOUS
  Filled 2018-01-11: qty 1

## 2018-01-11 MED ORDER — ACETAMINOPHEN 650 MG RE SUPP: 650.0000 mg | Freq: Four times a day (QID) | RECTAL | Status: DC | PRN

## 2018-01-11 MED ORDER — CLONAZEPAM 0.5 MG PO TABS
ORAL_TABLET | ORAL | Status: AC
  Administered 2018-01-11: 0.5 mg via ORAL
  Filled 2018-01-11: qty 1

## 2018-01-11 NOTE — Progress Notes (Signed)
NP notified of today's labwork. Patient was called to tell him that his platelet level is 6.  He said that he was on his way to [place] and would not be able to come in for platelets today.  Patient advised that he needed to be extra careful ambulating and to definitely [place].  Patient was also instructed to answer his phone when he received another call from the [place] for a time for transfusion and patient verbalized understanding.

## 2018-01-11 NOTE — ED Notes (Signed)
Patient is resting comfortably. 

## 2018-01-11 NOTE — ED Provider Notes (Addendum)
[facility] Emergency Department Provider Note   ____________________________________________   I have reviewed the triage vital signs and the nursing notes.   HISTORY  Chief Complaint Abnormal Lab   History limited by: Not Limited   HPI Edgar [name] is a 27 y.o. male who presents to the emergency department today because of concerns for low platelets found on blood work performed earlier today through oncology clinic.  Patient has a history of testicular cancer is is taking chemotherapy.  He states that his blood levels do typically decrease after chemotherapy.  He states he does have some discomfort in his arms and legs which happens when his blood levels get low.  The patient states that he has not noticed any black or tarry stool.  Is not noticed any bleeding.  He has not noticed any red or pink-tinged urine.    Per medical record review patient has a history of testicular cancer.  Past Medical History:  Diagnosis Date  . Asthma    AS A CHILD-NO INHALERS  . Cancer ([place])    testicular cancer 11/2016 per pt   . DVT (deep venous thrombosis) ([place])   . GERD (gastroesophageal reflux disease)    TUMS PRN    Patient Active Problem List   Diagnosis Date Noted  . Sweet's syndrome   . Fever 12/08/2017  . Herpes labialis 11/26/2017  . Hypokalemia 11/21/2017  . Puncture wound of right foot with foreign body 11/21/2017  . Neutrophilic dermatosis 99/37/1696  . Dental abscess 11/18/2017  . Leg ulcer, left ([place]) 11/12/2017  . History of DVT (deep vein thrombosis)   . Thrombocytopenia ([place]) 11/06/2017  . Lower extremity edema 11/06/2017  . Antineoplastic chemotherapy induced anemia   . Chronic anticoagulation   . Hypomagnesemia 11/05/2017  . Sepsis ([place]) 11/05/2017  . Pain   . History of testicular cancer   . DVT (deep venous thrombosis) ([place])   . Acute deep vein thrombosis (DVT) of proximal vein of left lower extremity ([place]) 10/09/2017  . Diarrhea  10/09/2017  . Goals of care, counseling/discussion 10/09/2017  . Anemia   . Cellulitis 10/08/2017  . Current moderate episode of major depressive disorder without prior episode ([place]) 11/20/2016  . Encounter for antineoplastic chemotherapy 01/19/2016  . Testicular cancer ([place]) 11/26/2015    Past Surgical History:  Procedure Laterality Date  . ANKLE FRACTURE SURGERY Right 2004  . FINE NEEDLE ASPIRATION BIOPSY N/A 11/09/2017   Procedure: FINE NEEDLE ASPIRATION BIOPSY;  Surgeon: [name], MD;  Location: [facility];  Service: Cardiovascular;  Laterality: N/A;  . IVC FILTER INSERTION    . ORCHIECTOMY Left 11/26/2015   Procedure: ORCHIECTOMY;  Surgeon: [name], MD;  Location: [facility] OR[facility];  Service: Urology;  Laterality: Left;  radical/Inguinal approach  . PERIPHERAL VASCULAR CATHETERIZATION N/A 12/16/2015   Procedure: [name] Cath Insertion;  Surgeon: [name], MD;  Location: [facility];  Service: Cardiovascular;  Laterality: N/A;  . PERIPHERAL VASCULAR THROMBECTOMY Left 10/13/2017   Procedure: PERIPHERAL VASCULAR THROMBECTOMY;  Surgeon: [name], MD;  Location: [facility];  Service: Cardiovascular;  Laterality: Left;  . WISDOM TOOTH EXTRACTION  2017    Prior to Admission medications   Medication Sig Start Date End Date Taking? Authorizing Provider  clobetasol ointment (TEMOVATE) 7.89 % Apply 1 application topically 2 (two) times daily as needed (bandage changes).  12/22/17  Yes [provider]  clonazePAM (KLONOPIN) 0.5 MG disintegrating tablet Take 1 tablet (0.5 mg  total) by mouth 2 (two) times daily as needed (anxiety). 01/01/18  Yes Dustin Flock, MD  gabapentin (NEURONTIN) 300 MG capsule Take 300 mg by mouth at bedtime.   Yes [provider]  loratadine (CLARITIN) 10 MG tablet Take 1 tablet (10 mg total) by mouth daily. 01/08/18  Yes Karen Kitchens, NP  magnesium oxide (MAG-OX) 400 MG tablet Take 400 mg by mouth daily.    Yes [provider]  omeprazole (PRILOSEC) 20 MG capsule Take 1 capsule (20 mg total) by mouth daily. 12/14/17  Yes Karen Kitchens, NP  Oxycodone HCl 10 MG TABS Take 1 tablet (10 mg total) by mouth every 8 (eight) hours as needed. 01/09/18  Yes Karen Kitchens, NP  potassium chloride (K-DUR) 10 MEQ tablet Take 10 mEq by mouth 2 (two) times daily.   Yes [provider]  predniSONE (DELTASONE) 10 MG tablet Take 10 mg by mouth See admin instructions. Take 1 tablet (10MG ) by mouth daily for 7 days then take 1 tablet (10MG ) by mouth every other day for 7 days 01/05/18 01/19/18 Yes [provider]  predniSONE (DELTASONE) 10 MG tablet Take 3 tablets (30 mg total) by mouth 2 (two) times daily with a meal. Patient not taking: Reported on 01/11/2018 12/14/17   [name], MD  XARELTO 20 MG TABS tablet Take 1 tablet by mouth daily. 09/26/17   [provider]    Allergies Patient has no known allergies.  Family History  Problem Relation Age of Onset  . Skin cancer Mother   . Breast cancer Maternal Grandmother   . Colon cancer Maternal Grandfather   . Diabetes Maternal Grandfather   . Emphysema Father   . Mental illness Sister        anxiety  . Stroke Paternal Grandmother   . Prostate cancer Neg Hx   . Kidney cancer Neg Hx   . Bladder Cancer Neg Hx     Social History Social History   Tobacco Use  . Smoking status: Current Every Day Smoker    Packs/day: 0.50    Years: 8.00    Pack years: 4.00    Types: Cigarettes  . Smokeless tobacco: Former Systems developer    Types: Snuff  Substance Use Topics  . Alcohol use: No  . Drug use: Not Currently    Types: Marijuana    Review of Systems Constitutional: No fever/chills Eyes: No visual changes. ENT: No sore throat. Cardiovascular: Denies chest pain. Respiratory: Denies shortness of breath. Gastrointestinal: No abdominal pain.  No nausea, no vomiting.  No diarrhea.   Genitourinary: Negative for  dysuria. Musculoskeletal: Positive for extremity discomfort.  Skin: Positive for rash to lower extremities Neurological: Negative for headaches, focal weakness or numbness.  ____________________________________________   PHYSICAL EXAM:  VITAL SIGNS: ED Triage Vitals  Enc Vitals Group     BP 01/11/18 1845 124/81     Pulse Rate 01/11/18 1845 91     Resp 01/11/18 1845 18     Temp 01/11/18 1845 98.6 F (37 C)     Temp Source 01/11/18 1845 Oral     SpO2 01/11/18 1845 100 %     Weight 01/11/18 1846 255 lb 11.7 oz (116 kg)     Height 01/11/18 1846 6\' 3"  (1.905 m)     Head Circumference --      Peak Flow --      Pain Score 01/11/18 1846 8   Constitutional: Alert and oriented.  Eyes: Conjunctivae are normal.  ENT      Head: Normocephalic and atraumatic.      Nose: No congestion/rhinnorhea.      Mouth/Throat: Mucous membranes are moist.      Neck: No stridor. Hematological/Lymphatic/Immunilogical: No cervical lymphadenopathy. Cardiovascular: Normal rate, regular rhythm.  No murmurs, rubs, or gallops. Respiratory: Normal respiratory effort without tachypnea nor retractions. Breath sounds are clear and equal bilaterally. No wheezes/rales/rhonchi. Gastrointestinal: Soft and non tender. No rebound. No guarding.  Genitourinary: Deferred Musculoskeletal: Normal range of motion in all extremities. No lower extremity edema. Neurologic:  Normal speech and language. No gross focal neurologic deficits are appreciated.  Skin:  Skin is warm, dry and intact. No rash noted. Psychiatric: Mood and affect are normal. Speech and behavior are normal. Patient exhibits appropriate insight and judgment.  ____________________________________________    LABS (pertinent positives/negatives)  CBC wbc 3.1, hgb 8.2, plt 6 on bloodwork performed earlier today BMP na 138, k 3.5, glu 108, cr 0.76   ____________________________________________   EKG  I, Nance Pear, attending physician, personally  viewed and interpreted this EKG  EKG Time: 1850 Rate: 114 Rhythm: sinus tachycardia Axis: normal Intervals: qtc 468 QRS: narrow ST changes: no st elevation Impression: abnormal ekg   ____________________________________________    RADIOLOGY  None  ____________________________________________   PROCEDURES  Procedures  CRITICAL CARE Performed by: Nance Pear   Total critical care time: 20 minutes  Critical care time was exclusive of separately billable procedures and treating other patients.  Critical care was necessary to treat or prevent imminent or life-threatening deterioration.  Critical care was time spent personally by me on the following activities: development of treatment plan with patient and/or surrogate as well as nursing, discussions with consultants, evaluation of patient's response to treatment, examination of patient, obtaining history from patient or surrogate, ordering and performing treatments and interventions, ordering and review of laboratory studies, ordering and review of radiographic studies, pulse oximetry and re-evaluation of patient's condition.  ____________________________________________   INITIAL IMPRESSION / ASSESSMENT AND PLAN / ED COURSE  Pertinent labs & imaging results that were available during my care of the patient were reviewed by me and considered in my medical decision making (see chart for details).   Patient presented to the emergency department today because of concerns for low platelet count after chemotherapy.  Patient without any active bleeding.  Platelets have been ordered however patient's unit will not be ready tonight. Discussed with Dr. Tasia Catchings who stated as long as no active bleeding that it would be okay to wait for the platelet.    ____________________________________________   FINAL CLINICAL IMPRESSION(S) / ED DIAGNOSES  Final diagnoses:  Thrombocytopenia ([place])  Anemia, unspecified type     Note: This  dictation was prepared with Dragon dictation. Any transcriptional errors that result from this process are unintentional     Nance Pear, MD 01/11/18 2015    Nance Pear, MD 01/26/18 [id number]

## 2018-01-11 NOTE — Patient Instructions (Signed)
Filgrastim, G-CSF injection  What is this medicine?  FILGRASTIM, G-CSF (fil GRA stim) is a granulocyte colony-stimulating factor that stimulates the growth of neutrophils, a type of white blood cell (WBC) important in the body's fight against infection. It is used to reduce the incidence of fever and infection in patients with certain types of cancer who are receiving chemotherapy that affects the bone marrow, to stimulate blood cell production for removal of WBCs from the body prior to a bone marrow transplantation, to reduce the incidence of fever and infection in patients who have severe chronic neutropenia, and to improve survival outcomes following high-dose radiation exposure that is toxic to the bone marrow.  This medicine may be used for other purposes; ask your health care provider or pharmacist if you have questions.  COMMON BRAND NAME(S): Neupogen, Zarxio  What should I tell my health care provider before I take this medicine?  They need to know if you have any of these conditions:  -kidney disease  -latex allergy  -ongoing radiation therapy  -sickle cell disease  -an unusual or allergic reaction to filgrastim, pegfilgrastim, other medicines, foods, dyes, or preservatives  -pregnant or trying to get pregnant  -breast-feeding  How should I use this medicine?  This medicine is for injection under the skin or infusion into a vein. As an infusion into a vein, it is usually given by a health care professional in a hospital or clinic setting. If you get this medicine at home, you will be taught how to prepare and give this medicine. Refer to the Instructions for Use that come with your medication packaging. Use exactly as directed. Take your medicine at regular intervals. Do not take your medicine more often than directed.  It is important that you put your used needles and syringes in a special sharps container. Do not put them in a trash can. If you do not have a sharps container, call your pharmacist or  healthcare provider to get one.  Talk to your pediatrician regarding the use of this medicine in children. While this drug may be prescribed for children as young as 7 months for selected conditions, precautions do apply.  Overdosage: If you think you have taken too much of this medicine contact a poison control center or emergency room at once.  NOTE: This medicine is only for you. Do not share this medicine with others.  What if I miss a dose?  It is important not to miss your dose. Call your doctor or health care professional if you miss a dose.  What may interact with this medicine?  This medicine may interact with the following medications:  -medicines that may cause a release of neutrophils, such as lithium  This list may not describe all possible interactions. Give your health care provider a list of all the medicines, herbs, non-prescription drugs, or dietary supplements you use. Also tell them if you smoke, drink alcohol, or use illegal drugs. Some items may interact with your medicine.  What should I watch for while using this medicine?  You may need blood work done while you are taking this medicine.  What side effects may I notice from receiving this medicine?  Side effects that you should report to your doctor or health care professional as soon as possible:  -allergic reactions like skin rash, itching or hives, swelling of the face, lips, or tongue  -dizziness or feeling faint  -fever  -pain, redness, or irritation at site where injected  -pinpoint red spots   on the skin  -shortness of breath or breathing problems  -signs and symptoms of kidney injury like trouble passing urine, change in the amount of urine, or red or dark-brown urine  -stomach or side pain, or pain at the shoulder  -swelling  -tiredness  -  unusual bleeding or bruising  Side effects that usually do not require medical attention (report to your doctor or health care professional if they continue or are bothersome):  -bone  pain  -headache  -muscle pain  This list may not describe all possible side effects. Call your doctor for medical advice about side effects. You may report side effects to FDA at 1-800-FDA-1088.  Where should I keep my medicine?  Keep out of the reach of children.  Store in a refrigerator between 2 and 8 degrees C (36 and 46 degrees F). Do not freeze. Keep in carton to protect from light. Throw away this medicine if vials or syringes are left out of the refrigerator for more than 24 hours. Throw away any unused medicine after the expiration date.  NOTE: This sheet is a summary. It may not cover all possible information. If you have questions about this medicine, talk to your doctor, pharmacist, or health care provider.  © 2018 Elsevier/Gold Standard (2014-09-19 10:57:13)

## 2018-01-11 NOTE — Telephone Encounter (Signed)
Called by ER physician that patient is admitted. Thrombocytopenia 6,000. No signs of acute bleeding. History of chemotherapy induced thrombocytopenia, recently got chemotherapy for testicular cancer on 01/03/2018.   Typed and crossed and awaiting for platelet transfusion. Per ER physician, platelet is not available due to difficult cross and asks if ok from Hem/Onc aspect to transfuse tomorrow since patient is not bleeding. Advise ER to contact blood bank supervisor for cross matched platelet emergently as patient has high risk of spontaneous bleeding. If he bleeds and if no crossmatched platelet is available, recommend transfuse pooled platelet. Irradiated blood products as he is immunocompromised from chemotherapy.

## 2018-01-11 NOTE — ED Notes (Signed)
Patient has port and requesting port to be accessed to obtain labs

## 2018-01-11 NOTE — Telephone Encounter (Signed)
  I thought he was taking supplemental potassium to maintain his current potassium level.  M

## 2018-01-11 NOTE — Telephone Encounter (Signed)
...     Ref Range & Units 4d ago 8d ago 10d ago  Potassium 3.5 - 5.1 mmol/L 3.9  4.1  4.5

## 2018-01-11 NOTE — H&P (Signed)
[place] [facility] [place] NAME: Edgar [name]    MR#:  [id number]  DATE OF BIRTH:  08-29-1990  DATE OF ADMISSION:  01/11/2018  PRIMARY CARE PHYSICIAN: [name], MD   REQUESTING/REFERRING PHYSICIAN: dr Archie Balboa  CHIEF COMPLAINT:   Low platelets HISTORY OF PRESENT ILLNESS:  Edgar [name]  is a 27 y.o. male with a known history of testicular cancer, DVT on anticoagulation who presents to the emergency room due to abnormal lab.  Patient had lab work done this morning at his oncologist office and he was asked to come to the ER due to platelet count of 6 and hemoglobin 80.8 0.2.  Patient needs specialized platelet count in the ED physician did speak with oncologist who said that since the patient is not actively bleeding we can wait for the platelets to come which should arrive tomorrow.  He denies chest pain or shortness of breath  PAST MEDICAL HISTORY:   Past Medical History:  Diagnosis Date  . Asthma    AS A CHILD-NO INHALERS  . Cancer ([place])    testicular cancer 11/2016 per pt   . DVT (deep venous thrombosis) ([place])   . GERD (gastroesophageal reflux disease)    TUMS PRN    PAST SURGICAL HISTORY:   Past Surgical History:  Procedure Laterality Date  . ANKLE FRACTURE SURGERY Right 2004  . FINE NEEDLE ASPIRATION BIOPSY N/A 11/09/2017   Procedure: FINE NEEDLE ASPIRATION BIOPSY;  Surgeon: [name], MD;  Location: [facility];  Service: Cardiovascular;  Laterality: N/A;  . IVC FILTER INSERTION    . ORCHIECTOMY Left 11/26/2015   Procedure: ORCHIECTOMY;  Surgeon: [name], MD;  Location: [facility];  Service: Urology;  Laterality: Left;  radical/Inguinal approach  . PERIPHERAL VASCULAR CATHETERIZATION N/A 12/16/2015   Procedure: [name] Cath Insertion;  Surgeon: [name], MD;  Location: [facility];  Service: Cardiovascular;  Laterality: N/A;  . PERIPHERAL VASCULAR THROMBECTOMY Left 10/13/2017   Procedure:  PERIPHERAL VASCULAR THROMBECTOMY;  Surgeon: [name], MD;  Location: [facility];  Service: Cardiovascular;  Laterality: Left;  . WISDOM TOOTH EXTRACTION  2017    SOCIAL HISTORY:   Social History   Tobacco Use  . Smoking status: Current Every Day Smoker    Packs/day: 0.50    Years: 8.00    Pack years: 4.00    Types: Cigarettes  . Smokeless tobacco: Former Systems developer    Types: Snuff  Substance Use Topics  . Alcohol use: No    FAMILY HISTORY:   Family History  Problem Relation Age of Onset  . Skin cancer Mother   . Breast cancer Maternal Grandmother   . Colon cancer Maternal Grandfather   . Diabetes Maternal Grandfather   . Emphysema Father   . Mental illness Sister        anxiety  . Stroke Paternal Grandmother   . Prostate cancer Neg Hx   . Kidney cancer Neg Hx   . Bladder Cancer Neg Hx     DRUG ALLERGIES:  No Known Allergies  REVIEW OF SYSTEMS:   Review of Systems  Constitutional: Positive for malaise/fatigue. Negative for chills and fever.  HENT: Negative.  Negative for ear discharge, ear pain, hearing loss, nosebleeds and sore throat.   Eyes: Negative.  Negative for blurred vision and pain.  Respiratory: Negative.  Negative for cough, hemoptysis, shortness of breath and wheezing.   Cardiovascular: Negative.  Negative for chest pain,  palpitations and leg swelling.  Gastrointestinal: Negative.  Negative for abdominal pain, blood in stool, diarrhea, nausea and vomiting.  Genitourinary: Negative.  Negative for dysuria.  Musculoskeletal: Negative.  Negative for back pain.  Skin: Negative.   Neurological: Negative for dizziness, tremors, speech change, focal weakness, seizures and headaches.  Endo/Heme/Allergies: Bruises/bleeds easily.  Psychiatric/Behavioral: Negative.  Negative for depression, hallucinations and suicidal ideas.    MEDICATIONS AT HOME:   Prior to Admission medications   Medication Sig Start Date End Date Taking? Authorizing  Provider  clobetasol ointment (TEMOVATE) 0.05 % Apply topically 1-2 times a day to the area on your left calf with bandage changes 12/22/17   [provider]  clonazePAM (KLONOPIN) 0.5 MG disintegrating tablet Take 1 tablet (0.5 mg total) by mouth 2 (two) times daily as needed (anxiety). Patient not taking: Reported on 01/03/2018 01/01/18   Dustin Flock, MD  gabapentin (NEURONTIN) 300 MG capsule Take 300 mg by mouth at bedtime.    [provider]  loratadine (CLARITIN) 10 MG tablet Take 1 tablet (10 mg total) by mouth daily. 01/08/18   Karen Kitchens, NP  magnesium oxide (MAG-OX) 400 MG tablet Take 400 mg by mouth daily.    [provider]  omeprazole (PRILOSEC) 20 MG capsule Take 1 capsule (20 mg total) by mouth daily. 12/14/17   Karen Kitchens, NP  ondansetron (ZOFRAN) 4 MG tablet Take 4 mg by mouth every 8 (eight) hours as needed for nausea or vomiting.    [provider]  ondansetron (ZOFRAN) 8 MG tablet Take 8 mg by mouth every 8 (eight) hours as needed for nausea or vomiting.    [provider]  Oxycodone HCl 10 MG TABS Take 1 tablet (10 mg total) by mouth every 8 (eight) hours as needed. 01/09/18   Karen Kitchens, NP  potassium chloride (K-DUR) 10 MEQ tablet Take 10 mEq by mouth 2 (two) times daily.    [provider]  predniSONE (DELTASONE) 10 MG tablet Take 3 tablets (30 mg total) by mouth 2 (two) times daily with a meal. Patient taking differently: Take 20 mg by mouth daily.  12/14/17   Corcoran, Drue Second, MD  XARELTO 20 MG TABS tablet Take 1 tablet by mouth daily. 09/26/17   [provider]      VITAL SIGNS:  Blood pressure 124/81, pulse 91, temperature 98.6 F (37 C), temperature source Oral, resp. rate 18, height 6\' 3"  (1.905 m), weight 116 kg, SpO2 100 %.  PHYSICAL EXAMINATION:   Physical Exam  Constitutional: He is oriented to person, place, and time. No distress.  HENT:  Head: Normocephalic.  Eyes: No scleral  icterus.  Neck: Normal range of motion. Neck supple. No JVD present. No tracheal deviation present.  Cardiovascular: Normal rate, regular rhythm and normal heart sounds. Exam reveals no gallop and no friction rub.  No murmur heard. Pulmonary/Chest: Effort normal and breath sounds normal. No respiratory distress. He has no wheezes. He has no rales. He exhibits no tenderness.  Abdominal: Soft. Bowel sounds are normal. He exhibits no distension and no mass. There is no tenderness. There is no rebound and no guarding.  Musculoskeletal: Normal range of motion. He exhibits no edema.  Neurological: He is alert and oriented to person, place, and time.  Skin: Skin is warm. No rash noted. No erythema.  Psychiatric: Judgment normal.      LABORATORY PANEL:   CBC Recent Labs  Lab 01/11/18 0908  WBC 3.1*  HGB 8.2*  HCT 25.0*  PLT 6*   ------------------------------------------------------------------------------------------------------------------  Chemistries  Recent Labs  Lab 01/07/18 0940 01/11/18 1925  NA 138 138  K 3.9 3.5  CL 106 104  CO2 23 28  GLUCOSE 103* 108*  BUN 15 21*  CREATININE 0.81 0.76  CALCIUM 9.1 8.7*  MG 1.7  --   AST 13*  --   ALT 18  --   ALKPHOS 41  --   BILITOT 0.4  --    ------------------------------------------------------------------------------------------------------------------  Cardiac Enzymes No results for input(s): TROPONINI in the last 168 hours. ------------------------------------------------------------------------------------------------------------------  RADIOLOGY:  No results found.  EKG:  Sinus tachycardia without ST elevation or depression  IMPRESSION AND PLAN:   27 year old male with testicular cancer being followed by oncologist receiving chemotherapy with last chemotherapy about 2 weeks ago who presents due to abnormal lab.  1.  Thrombocytopenia: This may be due to recent chemotherapy Patient will have platelet  transfusion in a.m. as per oncology  2.  Acute on  chronic anemia due to chemotherapy: Management as per oncology Oncology consultation placed in case discussed by ED physician to the oncologist on call.  3. Tobacco dependence: Patient is encouraged to quit smoking. Counseling was provided for 4 minutes.  4.  Testicular cancer with recent chemotherapy: Management as per oncology   All the records are reviewed and case discussed with ED provider. Management plans discussed with the patient and he is in agreement  CODE STATUS: full  TOTAL TIME TAKING CARE OF THIS PATIENT: 46 minutes.    Aadya Kindler M.D on 01/11/2018 at 7:51 PM  Between 7am to 6pm - Pager - [phone]  After 6pm go to [email or web address] - password [email or web address] [facility]  Office  [phone]  CC: Primary care physician; [name], MD

## 2018-01-11 NOTE — ED Triage Notes (Signed)
Patient had blood work done at oncologist this morning and was called and told to come in for admission due to platelet count of 6 and hemoglobin of 8.2. Patient reports worsening back for several days. Denies SOB. States he feels like his heart is racing.

## 2018-01-11 NOTE — ED Notes (Signed)
Report given to Andrea RN

## 2018-01-12 ENCOUNTER — Inpatient Hospital Stay: Payer: BLUE CROSS/BLUE SHIELD

## 2018-01-12 ENCOUNTER — Ambulatory Visit: Payer: BLUE CROSS/BLUE SHIELD

## 2018-01-12 ENCOUNTER — Inpatient Hospital Stay: Payer: BLUE CROSS/BLUE SHIELD | Admitting: Hematology and Oncology

## 2018-01-12 DIAGNOSIS — T451X5A Adverse effect of antineoplastic and immunosuppressive drugs, initial encounter: Secondary | ICD-10-CM

## 2018-01-12 DIAGNOSIS — D709 Neutropenia, unspecified: Secondary | ICD-10-CM

## 2018-01-12 DIAGNOSIS — F17210 Nicotine dependence, cigarettes, uncomplicated: Secondary | ICD-10-CM

## 2018-01-12 DIAGNOSIS — G629 Polyneuropathy, unspecified: Secondary | ICD-10-CM

## 2018-01-12 DIAGNOSIS — Z803 Family history of malignant neoplasm of breast: Secondary | ICD-10-CM

## 2018-01-12 DIAGNOSIS — R5383 Other fatigue: Secondary | ICD-10-CM

## 2018-01-12 DIAGNOSIS — Z800 Family history of malignant neoplasm of digestive organs: Secondary | ICD-10-CM

## 2018-01-12 DIAGNOSIS — Z7901 Long term (current) use of anticoagulants: Secondary | ICD-10-CM

## 2018-01-12 DIAGNOSIS — D6481 Anemia due to antineoplastic chemotherapy: Secondary | ICD-10-CM

## 2018-01-12 DIAGNOSIS — L982 Febrile neutrophilic dermatosis [Sweet]: Secondary | ICD-10-CM

## 2018-01-12 DIAGNOSIS — Z79899 Other long term (current) drug therapy: Secondary | ICD-10-CM

## 2018-01-12 DIAGNOSIS — Z808 Family history of malignant neoplasm of other organs or systems: Secondary | ICD-10-CM

## 2018-01-12 DIAGNOSIS — L97929 Non-pressure chronic ulcer of unspecified part of left lower leg with unspecified severity: Secondary | ICD-10-CM

## 2018-01-12 DIAGNOSIS — D696 Thrombocytopenia, unspecified: Secondary | ICD-10-CM

## 2018-01-12 DIAGNOSIS — Z86718 Personal history of other venous thrombosis and embolism: Secondary | ICD-10-CM

## 2018-01-12 DIAGNOSIS — C6212 Malignant neoplasm of descended left testis: Secondary | ICD-10-CM

## 2018-01-12 LAB — RETIC PANEL
Immature Retic Fract: 7.9 % (ref 2.3–15.9)
RBC.: 2.31 MIL/uL — AB (ref 4.22–5.81)
RETIC COUNT ABSOLUTE: 6.7 10*3/uL — AB (ref 19.0–186.0)
Retic Ct Pct: 0.3 % — ABNORMAL LOW (ref 0.4–3.1)
Reticulocyte Hemoglobin: 35.4 pg (ref >27.9)

## 2018-01-12 LAB — CBC
HCT: 22.9 % — ABNORMAL LOW (ref 39.0–52.0)
HEMOGLOBIN: 7.3 g/dL — AB (ref 13.0–17.0)
MCH: 31.6 pg (ref 26.0–34.0)
MCHC: 31.9 g/dL (ref 30.0–36.0)
MCV: 99.1 fL (ref 80.0–100.0)
Platelets: 7 10*3/uL — CL (ref 150–400)
RBC: 2.31 MIL/uL — AB (ref 4.22–5.81)
RDW: 14.6 % (ref 11.5–15.5)
WBC: 3.5 10*3/uL — ABNORMAL LOW (ref 4.0–10.5)
nRBC: 0.6 % — ABNORMAL HIGH (ref 0.0–0.2)

## 2018-01-12 LAB — PLATELET COUNT: Platelets: 25 10*3/uL — CL (ref 150–400)

## 2018-01-12 LAB — PREPARE RBC (CROSSMATCH)

## 2018-01-12 LAB — IMMATURE PLATELET FRACTION: Immature Platelet Fraction: 3.5 % (ref 1.2–8.6)

## 2018-01-12 LAB — HEMOGLOBIN: Hemoglobin: 8.5 g/dL — ABNORMAL LOW (ref 13.0–17.0)

## 2018-01-12 LAB — PATHOLOGIST SMEAR REVIEW

## 2018-01-12 MED ORDER — FILGRASTIM-SNDZ 480 MCG/0.8ML IJ SOSY
480.0000 ug | PREFILLED_SYRINGE | Freq: Once | INTRAMUSCULAR | Status: AC
  Administered 2018-01-12: 17:00:00 480 ug via SUBCUTANEOUS
  Filled 2018-01-12: qty 0.8

## 2018-01-12 MED ORDER — SODIUM CHLORIDE 0.9% IV SOLUTION
Freq: Once | INTRAVENOUS | Status: AC
  Administered 2018-01-12: 15:00:00 via INTRAVENOUS

## 2018-01-12 MED ORDER — SODIUM CHLORIDE 0.9% FLUSH: 3.0000 mL | INTRAVENOUS | Status: DC | PRN

## 2018-01-12 MED ORDER — FILGRASTIM-SNDZ 480 MCG/0.8ML IJ SOSY
480.0000 ug | PREFILLED_SYRINGE | Freq: Once | INTRAMUSCULAR | Status: DC
  Filled 2018-01-12: qty 0.8

## 2018-01-12 MED ORDER — SODIUM CHLORIDE 0.9% FLUSH: 10.0000 mL | INTRAVENOUS | Status: DC | PRN

## 2018-01-12 MED ORDER — PREDNISONE 20 MG PO TABS: 20.0000 mg | ORAL_TABLET | Freq: Every day | ORAL | 0 refills | Status: DC

## 2018-01-12 MED ORDER — HYDROMORPHONE HCL 1 MG/ML IJ SOLN
1.0000 mg | Freq: Once | INTRAMUSCULAR | Status: AC
  Administered 2018-01-12: 1 mg via INTRAVENOUS
  Filled 2018-01-12: qty 1

## 2018-01-12 MED ORDER — MORPHINE SULFATE (PF) 2 MG/ML IV SOLN
2.0000 mg | INTRAVENOUS | Status: DC | PRN
  Administered 2018-01-12 – 2018-01-13 (×2): 2 mg via INTRAVENOUS
  Filled 2018-01-12 (×2): qty 1

## 2018-01-12 MED ORDER — SODIUM CHLORIDE 0.9% IV SOLUTION: 250.0000 mL | Freq: Once | INTRAVENOUS | Status: DC

## 2018-01-12 MED FILL — Oxycodone HCl Tab 5 MG: ORAL | Qty: 2 | Status: AC

## 2018-01-12 NOTE — Consult Note (Signed)
Hematology/Oncology Consult note [facility] [phone] [phone]  Patient Care Team: [name], MD as PCP - General (Hematology and Oncology)   Name of the patient: Edgar [name]  [id number]  09-Jun-1990   Date of visit: 01/12/18 REASON FOR COSULTATION:  Low platelet count.  History of presenting illness-  27 y.o. male with PMH listed at below who presents to ER due to concern of low platelet counts found on blood work. Pertinent oncology history reviewed.  Patient has a history of recurrent testicular cancer currently on chemotherapy with TIP regimen.  Detailed previous oncology history please refer to outpatient oncology notes by Dr. Mike Gip. Marland Kitchen He was recently admitted to [facility] from 12/27/2017 to 01/01/2018 for inpatient chemotherapy, cycle 4 TIP Also has a history of left lower extremity DVT, on chronic anticoagulation with Xarelto which has been held outpatient due to thrombocytopenia.  He also has an IVC filter placement, percutaneous angioplasty and a stent placement of the left common iliac vein and left external iliac vein. 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  Patient has been on short acting G-CSF with Zarxio daily.  For sweet syndrome, he has been taking prednisone 20 mg daily.     01/07/2018, he had a CBC done which showed neutropenia with ANC of 0, hemoglobin 9.8, platelet count 29,000. 01/11/2018, platelet counts was 6000 and patient was advised to go to emergency room. .ANC has improved to 1.9 Per Dr. Mike Gip, supposed to get blood work checked 2 days ago and patient left cancer center without having blood checked.  Was called back on 01/11/2018 to have blood checked and he did not wait for lab results.  Cancer center office called patient and advised patient to come for platelet transfusion.  Patient was on the way to a water park in [place] and reluctant to come.  Eventually patient came back  and was advised to go to emergency room for transfusion. ER physician Dr. Archie Balboa called me as crossmatch platelet is not available last night.  He is not having any active bleeding, ER wonders if it is okay to transfuse today when crossmatch platelet is available.  I advised Dr. Archie Balboa to contact blood bank supervisor for issuing crossmatched plate emergently.  If he bleeds, recommend transfusing pooled platelets  Overnight no acute events.  Dr. Mike Gip is at [facility] site today and asked me to cover her to see this patient. Patient reports doing well.  He has had platelet transfusion this morning, finished around 8:30. Posttransfusion platelet counts were not done. Patient reports fatigue which is chronic especially after each chemotherapy regimen.  Otherwise denies any active bleeding, severe headache Chronic pain is well controlled with current pain regimen.  He tells me that his lower extremity ulcers are "healing".  Chronic neuropathy of his fingertips, not affecting function.  Review of Systems  Constitutional: Positive for malaise/fatigue. Negative for chills and fever.  HENT: Negative for nosebleeds and sore throat.   Eyes: Negative for double vision, photophobia and redness.  Respiratory: Negative for cough, shortness of breath and wheezing.   Cardiovascular: Negative for chest pain, palpitations and orthopnea.  Gastrointestinal: Negative for abdominal pain, blood in stool, nausea and vomiting.  Genitourinary: Negative for dysuria.  Musculoskeletal: Negative for back pain, myalgias and neck pain.       Chronic lower extremity ulcers  Skin: Negative for itching and rash.  Neurological: Positive for tingling. Negative for dizziness.  Endo/Heme/Allergies: Negative for environmental allergies. Does not bruise/bleed  easily.  Psychiatric/Behavioral: Negative for depression.    No Known Allergies  Patient Active Problem List   Diagnosis Date Noted  . Sweet's syndrome   . Fever  12/08/2017  . Herpes labialis 11/26/2017  . Hypokalemia 11/21/2017  . Puncture wound of right foot with foreign body 11/21/2017  . Neutrophilic dermatosis 93/79/0240  . Dental abscess 11/18/2017  . Leg ulcer, left ([place]) 11/12/2017  . History of DVT (deep vein thrombosis)   . Thrombocytopenia ([place]) 11/06/2017  . Lower extremity edema 11/06/2017  . Antineoplastic chemotherapy induced anemia   . Chronic anticoagulation   . Hypomagnesemia 11/05/2017  . Sepsis ([place]) 11/05/2017  . Pain   . History of testicular cancer   . DVT (deep venous thrombosis) ([place])   . Acute deep vein thrombosis (DVT) of proximal vein of left lower extremity ([place]) 10/09/2017  . Diarrhea 10/09/2017  . Goals of care, counseling/discussion 10/09/2017  . Anemia   . Cellulitis 10/08/2017  . Current moderate episode of major depressive disorder without prior episode ([place]) 11/20/2016  . Encounter for antineoplastic chemotherapy 01/19/2016  . Testicular cancer ([place]) 11/26/2015     Past Medical History:  Diagnosis Date  . Asthma    AS A CHILD-NO INHALERS  . Cancer ([place])    testicular cancer 11/2016 per pt   . DVT (deep venous thrombosis) ([place])   . GERD (gastroesophageal reflux disease)    TUMS PRN     Past Surgical History:  Procedure Laterality Date  . ANKLE FRACTURE SURGERY Right 2004  . FINE NEEDLE ASPIRATION BIOPSY N/A 11/09/2017   Procedure: FINE NEEDLE ASPIRATION BIOPSY;  Surgeon: [name], MD;  Location: [facility];  Service: Cardiovascular;  Laterality: N/A;  . IVC FILTER INSERTION    . ORCHIECTOMY Left 11/26/2015   Procedure: ORCHIECTOMY;  Surgeon: [name], MD;  Location: [facility];  Service: Urology;  Laterality: Left;  radical/Inguinal approach  . PERIPHERAL VASCULAR CATHETERIZATION N/A 12/16/2015   Procedure: [name] Cath Insertion;  Surgeon: [name], MD;  Location: [facility];  Service: Cardiovascular;  Laterality: N/A;  . PERIPHERAL VASCULAR THROMBECTOMY Left  10/13/2017   Procedure: PERIPHERAL VASCULAR THROMBECTOMY;  Surgeon: [name], MD;  Location: [facility];  Service: Cardiovascular;  Laterality: Left;  . WISDOM TOOTH EXTRACTION  2017    Social History   Socioeconomic History  . Marital status: Single    Spouse name: Not on file  . Number of children: Not on file  . Years of education: Not on file  . Highest education level: Not on file  Occupational History  . Not on file  Social Needs  . Financial resource strain: Patient refused  . Food insecurity:    Worry: Patient refused    Inability: Patient refused  . Transportation needs:    Medical: Patient refused    Non-medical: Patient refused  Tobacco Use  . Smoking status: Current Every Day Smoker    Packs/day: 0.50    Years: 8.00    Pack years: 4.00    Types: Cigarettes  . Smokeless tobacco: Former Systems developer    Types: Snuff  Substance and Sexual Activity  . Alcohol use: No  . Drug use: Not Currently    Types: Marijuana  . Sexual activity: Yes  Lifestyle  . Physical activity:    Days per week: Patient refused    Minutes per session: Patient refused  . Stress: To some extent  Relationships  . Social connections:  Talks on phone: Twice a week    Gets together: Once a week    Attends religious service: More than 4 times per year    Active member of club or organization: Yes    Attends meetings of clubs or organizations: Not on file    Relationship status: Never married  . Intimate partner violence:    Fear of current or ex partner: Patient refused    Emotionally abused: Patient refused    Physically abused: Patient refused    Forced sexual activity: Patient refused  Other Topics Concern  . Not on file  Social History Narrative  . Not on file     Family History  Problem Relation Age of Onset  . Skin cancer Mother   . Breast cancer Maternal Grandmother   . Colon cancer Maternal Grandfather   . Diabetes Maternal Grandfather   . Emphysema Father    . Mental illness Sister        anxiety  . Stroke Paternal Grandmother   . Prostate cancer Neg Hx   . Kidney cancer Neg Hx   . Bladder Cancer Neg Hx      Current Facility-Administered Medications:  .  0.9 %  sodium chloride infusion (Manually program via Guardrails IV Fluids), 250 mL, Intravenous, Once, Honor Loh E, NP .  0.9 %  sodium chloride infusion, 10 mL/hr, Intravenous, Once, Nance Pear, MD .  acetaminophen (TYLENOL) tablet 650 mg, 650 mg, Oral, Q6H PRN **OR** acetaminophen (TYLENOL) suppository 650 mg, 650 mg, Rectal, Q6H PRN, Mody, Sital, MD .  clonazePAM (KLONOPIN) tablet 0.5 mg, 0.5 mg, Oral, BID PRN, Bettey Costa, MD, 0.5 mg at 01/12/18 0903 .  gabapentin (NEURONTIN) capsule 300 mg, 300 mg, Oral, QHS, Mody, Sital, MD, 300 mg at 01/11/18 2005 .  loratadine (CLARITIN) tablet 10 mg, 10 mg, Oral, Daily, Mody, Sital, MD, 10 mg at 01/12/18 0857 .  magnesium oxide (MAG-OX) tablet 400 mg, 400 mg, Oral, Daily, Mody, Sital, MD, 400 mg at 01/12/18 0903 .  nicotine (NICODERM CQ - dosed in mg/24 hours) patch 21 mg, 21 mg, Transdermal, Daily, Mody, Sital, MD .  ondansetron (ZOFRAN) tablet 4 mg, 4 mg, Oral, Q8H PRN, Mody, Sital, MD .  ondansetron (ZOFRAN) tablet 8 mg, 8 mg, Oral, Q8H PRN, Mody, Sital, MD .  oxyCODONE (Oxy IR/ROXICODONE) immediate release tablet 10 mg, 10 mg, Oral, Q8H PRN, Benjie Karvonen, Sital, MD, 10 mg at 01/12/18 0650 .  pantoprazole (PROTONIX) EC tablet 40 mg, 40 mg, Oral, Daily, Mody, Sital, MD, 40 mg at 01/12/18 0855 .  polyethylene glycol (MIRALAX / GLYCOLAX) packet 17 g, 17 g, Oral, Daily PRN, Mody, Sital, MD .  potassium chloride (K-DUR,KLOR-CON) CR tablet 10 mEq, 10 mEq, Oral, BID, Mody, Sital, MD, 10 mEq at 01/12/18 0856 .  predniSONE (DELTASONE) tablet 20 mg, 20 mg, Oral, Daily, Bettey Costa, MD, Stopped at 01/12/18 0856 .  sodium chloride flush (NS) 0.9 % injection 10 mL, 10 mL, Intracatheter, PRN, Honor Loh E, NP .  sodium chloride flush (NS) 0.9 % injection 3  mL, 3 mL, Intracatheter, PRN, Karen Kitchens, NP  Facility-Administered Medications Ordered in Other Encounters:  .  filgrastim-sndz (ZARXIO) injection 480 mcg, 480 mcg, Subcutaneous, Once, Corcoran, Drue Second, MD .  [facility]) injection 480 mcg, 480 mcg, Subcutaneous, Once, [name], MD   Physical exam:  Vitals:   01/12/18 0639 01/12/18 0641 01/12/18 0657 01/12/18 0800  BP: 130/88  (!) 140/100 127/80  Pulse: 69  72 66  Resp: 18  20 18   Temp: 98.1 F (36.7 C)  97.8 F (36.6 C) 97.7 F (36.5 C)  TempSrc:  Oral Oral Oral  SpO2: 95%  98% 100%  Weight:      Height:       Physical Exam  GENERAL: No distress, well nourished.  SKIN:  No rashes or significant lesions  HEAD: Chemo induced alopecia EYES: Conjunctiva are pink, non icteric ENT: Poor dentition LYMPH: No palpable cervical and axillary lymphadenopathy  LUNGS: Clear to auscultation, no crackles or wheezes HEART: Regular rate & rhythm, no murmurs, no gallops, S1 normal and S2 normal  ABDOMEN: Abdomen soft, non-tender, normal bowel sounds, I did not appreciate any  masses or organomegaly  MUSCULOSKELETAL: No CVA tenderness and no tenderness on percussion of the back or rib cage.  EXTREMITIES: Bilateral lower extremity edema.,  He has healing lesions from previous drug-induced neutrophilic dermatosis, sweet syndrome. Lesion to posterior aspect of left lower extremity, wrapped. NEURO: Alert & oriented, no focal motor/sensory deficits.     CMP Latest Ref Rng & Units 01/11/2018  Glucose 70 - 99 mg/dL 108(H)  BUN 6 - 20 mg/dL 21(H)  Creatinine 0.61 - 1.24 mg/dL 0.76  Sodium 135 - 145 mmol/L 138  Potassium 3.5 - 5.1 mmol/L 3.5  Chloride 98 - 111 mmol/L 104  CO2 22 - 32 mmol/L 28  Calcium 8.9 - 10.3 mg/dL 8.7(L)  Total Protein 6.5 - 8.1 g/dL -  Total Bilirubin 0.3 - 1.2 mg/dL -  Alkaline Phos 38 - 126 U/L -  AST 15 - 41 U/L -  ALT 0 - 44 U/L -   CBC Latest Ref Rng & Units 01/12/2018  WBC 4.0 -  10.5 K/uL -  Hemoglobin 13.0 - 17.0 g/dL -  Hematocrit 39.0 - 52.0 % -  Platelets 150 - 400 K/uL 25(LL)   Assessment and plan- Patient is a 27 y.o. male with recurrent testicular cancer currently on chemotherapy with TIP regimen. Last chemotherapy on 01/01/2018, currently admitted due to thrombocytopenia.  #Thrombocytopenia, likely due to recent chemotherapy.  He has a history of chemotherapy-induced thrombocytopenia in the past. Status post crossmatched platelet transfusion this morning.  Post transfusion platelet count was not obtained.  I also ordered immature platelet fraction I discussed with patient's nurse and she is going to draw the lab. Labs reviewed.  Post transfusion platelet count improved to 25,000, appropriate response after platelet transfusion. I discontinued his PRN Toradol order for now.  #Chemo-induced neutropenia, patient has been on Zarxio daily outpatient..ANC improved to 1.9. #Chronic anticoagulation on Xarelto for DVT, currently on hold due to thrombocytopenia. # Symptomatic anemia, chemotherapy induced. Hgb dropped from 8.2 to 7.3 Retic count consistent with bone marrow suppression. Discussed with Dr. Brett Albino, recommend transfuse one unit of irradiated PRBC for symptomatic anemia.  Dr. Mike Gip to follow patient tomorrow. Plan discussed.    Thank you for allowing me to participate in the care of this patient.  Total face to face encounter time for this patient visit was 20min. >50% of the time was  spent in counseling and coordination of care.   Earlie Server, MD, PhD Hematology Oncology [facility] at [facility] Pager- [phone] 01/12/2018

## 2018-01-12 NOTE — ED Notes (Signed)
Pt resting on stretcher with eyes closed and even respirations. No distress noted. Lights off to enhance rest. Provided for safety and comfort. Will continue to assess.

## 2018-01-12 NOTE — Plan of Care (Signed)

## 2018-01-12 NOTE — Progress Notes (Deleted)
[facility] day:  01/12/2018  Chief Complaint: Edgar [name] is a 27 y.o. male with recurrent testicular cancer who is seen for assessment on day 17 of cycle #4 TIP chemotherapy.  HPI: The patient was last seen in the medical oncology clinic on 01/03/2018.  At that time, he was day 8 s/p cycle #4 TIP chemotherapy. Symptomatically, he was fatigued.  He denied any B symptoms.  No nausea or vomiting.  Patient notes some loose stools.  Pain was well controlled.  Appetite was decreased due to dysgeusia (metallic taste). LEFT lower extremity wound continues to improve with twice daily wound care and chronic steroids (prednisone 20 mg daily).  WBC 2000 ([place] 1700).  Hemoglobin 10.5, hematocrit 31.9, and platelets 134,000.  He began [employer].   Patient scheduled follow-up with Dr. Marolyn Hammock, dermatologist at [facility], on 01/05/2018. He was to decrease his prednisone to 10 mg a day x 7 days then 10 mg QOD x 7 days.  He was to continue clobetasol 0.05% ointment to the ulcer until resolved.  He has a follow-up on 01/19/2018.  He has had follow-up counts: 01/07/2018:  Hematocrit 29.9, hemoglobin 9.8, platelets 29,000, WBC 400 with an ANC of 0. 01/11/2018:  Hematocrit 25.0, hemoglobin 8.2, platelets 6,000, WBC 3100 with an ANC of 1900.  He was instructed to stop his Xarelto on 01/07/2018.  He left clinic yesterday and declined a platelet transfusion secondary to personal plans.  He returned to the [facility] ER last night.    Past Medical History:  Diagnosis Date  . Asthma    AS A CHILD-NO INHALERS  . Cancer ([place])    testicular cancer 11/2016 per pt   . DVT (deep venous thrombosis) ([place])   . GERD (gastroesophageal reflux disease)    TUMS PRN    Past Surgical History:  Procedure Laterality Date  . ANKLE FRACTURE SURGERY Right 2004  . FINE NEEDLE ASPIRATION BIOPSY N/A 11/09/2017   Procedure: FINE NEEDLE ASPIRATION BIOPSY;  Surgeon: [name], MD;  Location:  [facility];  Service: Cardiovascular;  Laterality: N/A;  . IVC FILTER INSERTION    . ORCHIECTOMY Left 11/26/2015   Procedure: ORCHIECTOMY;  Surgeon: [name], MD;  Location: [facility] ORS;  Service: Urology;  Laterality: Left;  radical/Inguinal approach  . PERIPHERAL VASCULAR CATHETERIZATION N/A 12/16/2015   Procedure: [name] Cath Insertion;  Surgeon: [name], MD;  Location: [facility];  Service: Cardiovascular;  Laterality: N/A;  . PERIPHERAL VASCULAR THROMBECTOMY Left 10/13/2017   Procedure: PERIPHERAL VASCULAR THROMBECTOMY;  Surgeon: [name], MD;  Location: [facility];  Service: Cardiovascular;  Laterality: Left;  . WISDOM TOOTH EXTRACTION  2017    Family History  Problem Relation Age of Onset  . Skin cancer Mother   . Breast cancer Maternal Grandmother   . Colon cancer Maternal Grandfather   . Diabetes Maternal Grandfather   . Emphysema Father   . Mental illness Sister        anxiety  . Stroke Paternal Grandmother   . Prostate cancer Neg Hx   . Kidney cancer Neg Hx   . Bladder Cancer Neg Hx     Social History:  reports that he has been smoking cigarettes. He has a 4.00 pack-year smoking history. He has quit using smokeless tobacco.  His smokeless tobacco use included snuff. He reports that he has current or past drug history. Drug: Marijuana. He reports that he does not  drink alcohol.  He smokes 1/2 pack a day.  He smokes marijuana.  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  He has a 79 year-old-daughter named [name]. The patient's mother's cell phone number is [phone].  Another contact number is [phone].  The patient lives in [place]. He is accompanied by his parents today.   Allergies: No Known Allergies  Current Medications: No current facility-administered medications for this visit.    No current outpatient medications on file.   Facility-Administered Medications Ordered in Other Visits  Medication  Dose Route Frequency Provider Last Rate Last Dose  . 0.9 %  sodium chloride infusion  10 mL/hr Intravenous Once Nance Pear, MD      . acetaminophen (TYLENOL) tablet 650 mg  650 mg Oral Q6H PRN Bettey Costa, MD       Or  . acetaminophen (TYLENOL) suppository 650 mg  650 mg Rectal Q6H PRN Bettey Costa, MD      . clonazePAM (KLONOPIN) tablet 0.5 mg  0.5 mg Oral BID PRN Bettey Costa, MD   0.5 mg at 01/11/18 2113  . filgrastim-sndz (ZARXIO) injection 480 mcg  480 mcg Subcutaneous Once [name], MD      . Karle Barr [facility]) injection 480 mcg  480 mcg Subcutaneous Once Nolon Stalls C, MD      . gabapentin (NEURONTIN) capsule 300 mg  300 mg Oral QHS Bettey Costa, MD   300 mg at 01/11/18 2005  . ketorolac (TORADOL) 30 MG/ML injection 30 mg  30 mg Intravenous Q6H PRN Bettey Costa, MD   30 mg at 01/11/18 2113  . loratadine (CLARITIN) tablet 10 mg  10 mg Oral Daily Mody, Sital, MD      . magnesium oxide (MAG-OX) tablet 400 mg  400 mg Oral Daily Mody, Sital, MD      . nicotine (NICODERM CQ - dosed in mg/24 hours) patch 21 mg  21 mg Transdermal Daily Mody, Sital, MD      . ondansetron (ZOFRAN) tablet 4 mg  4 mg Oral Q8H PRN Mody, Sital, MD      . ondansetron (ZOFRAN) tablet 8 mg  8 mg Oral Q8H PRN Mody, Sital, MD      . oxyCODONE (Oxy IR/ROXICODONE) immediate release tablet 10 mg  10 mg Oral Q8H PRN Bettey Costa, MD   10 mg at 01/11/18 2006  . pantoprazole (PROTONIX) EC tablet 40 mg  40 mg Oral Daily Mody, Sital, MD      . polyethylene glycol (MIRALAX / GLYCOLAX) packet 17 g  17 g Oral Daily PRN Mody, Sital, MD      . potassium chloride (K-DUR,KLOR-CON) CR tablet 10 mEq  10 mEq Oral BID Mody, Sital, MD      . predniSONE (DELTASONE) tablet 20 mg  20 mg Oral Daily Bettey Costa, MD        Review of Systems  Constitutional: Positive for malaise/fatigue. Negative for diaphoresis, fever and weight loss (up 4 pounds).       "I feel like I always do after chemo".   HENT: Negative.         Dysgeusia  Eyes: Negative.   Respiratory: Negative for cough, hemoptysis, sputum production and shortness of breath.   Cardiovascular: Negative for chest pain, palpitations, orthopnea, leg swelling and PND.       TACHYcardia  Gastrointestinal: Negative for abdominal pain, blood in stool, constipation, diarrhea, melena, nausea and vomiting.       Loose stools  Genitourinary:  Negative for dysuria, frequency, hematuria and urgency.  Musculoskeletal: Negative for back pain, falls, joint pain and myalgias.  Skin: Negative for itching and rash.       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   Neurological: Positive for sensory change (tips of finders; stable). Negative for dizziness, tremors, weakness and headaches.  Endo/Heme/Allergies: Does not bruise/bleed easily.  Psychiatric/Behavioral: Negative for depression, memory loss and suicidal ideas. The patient is nervous/anxious and has insomnia (steroid induced).   All other systems reviewed and are negative.  Performance status (ECOG): 1 - 2  Vital Signs There were no vitals taken for this visit.  Physical Exam  Constitutional: He is oriented to person, place, and time and well-developed, well-nourished, and in no distress.  HENT:  Head: Normocephalic and atraumatic. Hair is abnormal (chemotherapy induced alopecia).  Mouth/Throat: Oropharynx is clear and moist and mucous membranes are normal. Abnormal dentition (poor).  Eyes: Pupils are equal, round, and reactive to light. EOM are normal. No scleral icterus.  Brown eyes  Neck: Normal range of motion. Neck supple. No tracheal deviation present. No thyromegaly present.  Cardiovascular: Regular rhythm, normal heart sounds and intact distal pulses. Tachycardia present. Exam reveals no gallop and no friction rub.  No murmur heard. Pulmonary/Chest: Effort normal and breath sounds normal. No respiratory distress. He has no wheezes. He has no rales.  Abdominal: Soft. Bowel sounds are normal.  He exhibits no distension. There is no tenderness.  Musculoskeletal: Normal range of motion. He exhibits edema (BLE). He exhibits no tenderness.  Lymphadenopathy:    He has no cervical adenopathy.    He has no axillary adenopathy.       Right: No inguinal and no supraclavicular adenopathy present.       Left: No inguinal and no supraclavicular adenopathy present.  Neurological: He is alert and oriented to person, place, and time.  Skin: Skin is warm and dry. No rash noted. No erythema.     Psychiatric: Mood, affect and judgment normal.  Nursing note and vitals reviewed.   Medical photography: Lesion to posterior aspect of LEFT lower extremity                 12/27/2017                     12/20/2017                    12/17/2017                    Imaging studies: 11/28/2015:  Chest, abdomen, and pelvic CT revealed no mediastinal adenopathy or suspicious pulmonary nodules.   There were 2 prominent left periaortic retroperitoneal lymph nodes (1.7 cm and 0.9 cm) concerning for testicular nodal metastasis.  There were no abnormal lymph nodes above the renal veins.  There were postsurgical change in the left hemiscrotum and left inguinal canal.  There was a 3.3 cm soft tissue abnormality anterior to the left iliopsoas muscle which could represent a metastatic lymph node (N2 lesion).  12/17/2015:  Head MRI revealed no evidence of metastatic disease.   03/30/2016:  Chest, abdomen, and pelvic CT revealed interval resolution of left abdominal/pelvic lymphadenopathy.  There was no residual metastatic disease. 09/30/2016:  Chest, abdomen, and pelvic CT revealed no evidence of recurrent or metastatic disease.  09/10/2017:  Bilateral lower extremity duplex revealed evidence of obstruction in the left CFV, SFJ, and proximal femoral vein.  09/10/2017:  CT pelvic venogram revealed multiple  retroperitoneal masses including large mass within the left iliacus concerning for metastatic disease in the setting  of prior testicular cancer.  Mass in the left iliacus caused significant narrowing of the proximal left external iliac vein. There was filling defect within the distal left external iliac vein extending into the common femoral and proximal superficial femoral veins consistent with thrombus  09/11/2017:  Chest, abdomen, and pelvic CT revealed enlarged and morphologically abnormal 4.3 x 4.9 cm left pelvic sidewall and retroperitoneal lymph nodes (measuring up to 2.9 cm) concerning for metastatic disease given history of testicular cancer. Biopsy was suggested for confirmation. Prominent left inguinal lymph nodes could be metastatic or reactive in nature.  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  There was anasarca/swelling of the left lower extremity.  There was no evidence of metastatic disease in the chest. 09/13/2017:  Head MRI revealed no evidence of metastatic disease. 11/17/2017:  Right foot MRI revealed artifact in the plantar soft tissues over the first metatarsal head likely representing metallic foreign bodies as seen on previous radiographs.  There was an area of increased T2 signal intensity in the plantar aspect of the first metatarsal head with focal enhancement may indicate an area of focal osteomyelitis.  There was no soft tissue abscess. 12/07/2017:  Abdomen and pelvic CTrevealed no new or progressive metastatic disease.There was no acute vascular abnormality. IVC filter and left iliac venous stent graft werein place. There was persistent postsurgical fat stranding throughout the leftinguinal region without fluid collection 12/08/2017:  Bilateral lower extremity duplex revealed new nonocclusive thrombus in the left common femoralveinalong the anterior wall measuring 3-5 mm in thickness. This wasnot flow limiting but is new since the prior study on 11/05/2017 and consistent with acute/subacute thrombus (old per Dr.  Delana Meyer). There was no evidence of right lower extremity deep vein thrombosis   Admissions: He was admitted to [facility] from 10/08/2017 - 10/16/2017 with left lower extremity cellulitis.  He was treated with Cefepime and vancomycin.  Imaging revealed significant improvement in adenopathy with decreased pelvic sidewall disease and decreased bulk at aortic bifurcation.  He was felt to have venous congestion syndrome with skin lesions due to congestion.   He was admitted to [facility] from 10/21/2017 - 10/26/2017 for inpatient chemotherapy (cycle #2 TIP).    He was admitted to [facility] from 11/05/2017 - 11/09/2017 for cellulitis.  He was developing similar lower extremity skin lesions following his first cycle of chemotherapy.  Skin biopsy revealed diffuse neutrophilic dermatitis. Cultures were negative.  Xarelto was held secondary to thrombocytopenia (nadir platelet count 39,000).  He was treated with heparin.  Lower extremity duplex revealed no clot.  He was treated with Cefepime and vancomycin.  He was discharged on Augmentin.  He received oral acyclovir for herpetic labialis.  He was admitted to [facility] from 11/22/2017 - 11/27/2017 for inpatient chemotherapy (cycle #3 TIP).    He was admitted to [facility] from 12/07/2017 - 12/11/2017 with fever and lower extremity edema.  He developed recurrent leg lesions. 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  He fevers quickly resolved.  He received 1 unit of PRBCs.  Platelets became > 50,000 on 12/11/2017 and Xarelto was restarted.  He was admitted to [facility] from 12/27/2017 - 01/01/2018 for inpatient chemotherapy (cycle #4 TIP).   Admission on 01/11/2018  Component Date Value Ref Range  Status  . Sodium 01/11/2018 138  135 - 145 mmol/L Final  . Potassium 01/11/2018 3.5  3.5 - 5.1 mmol/L Final  . Chloride 01/11/2018 104  98 - 111 mmol/L Final  .  CO2 01/11/2018 28  22 - 32 mmol/L Final  . Glucose, Bld 01/11/2018 108* 70 - 99 mg/dL Final  . BUN 01/11/2018 21* 6 - 20 mg/dL Final  . Creatinine, Ser 01/11/2018 0.76  0.61 - 1.24 mg/dL Final  . Calcium 01/11/2018 8.7* 8.9 - 10.3 mg/dL Final  . GFR calc non Af Amer 01/11/2018 >60  >60 mL/min Final  . GFR calc Af Amer 01/11/2018 >60  >60 mL/min Final   Comment: (NOTE) The eGFR has been calculated using the CKD EPI equation. This calculation has not been validated in all clinical situations. eGFR's persistently <60 mL/min signify possible Chronic Kidney Disease.   Georgiann Hahn gap 01/11/2018 6  5 - 15 Final   Performed at [facility], [address] [place], [place] [address]  . ABO/RH(D) 01/11/2018 A POS   Final  . Antibody Screen 01/11/2018 NEG   Final  . Sample Expiration 01/11/2018    Final                   Value:01/14/2018 Performed at [facility], [address] [place], [place] [address]   Infusion on 01/11/2018  Component Date Value Ref Range Status  . WBC 01/11/2018 3.1* 4.0 - 10.5 K/uL Final  . RBC 01/11/2018 2.56* 4.22 - 5.81 MIL/uL Final  . Hemoglobin 01/11/2018 8.2* 13.0 - 17.0 g/dL Final  . HCT 01/11/2018 25.0* 39.0 - 52.0 % Final  . MCV 01/11/2018 97.7  80.0 - 100.0 fL Final  . MCH 01/11/2018 32.0  26.0 - 34.0 pg Final  . MCHC 01/11/2018 32.8  30.0 - 36.0 g/dL Final  . RDW 01/11/2018 14.6  11.5 - 15.5 % Final  . Platelets 01/11/2018 6* 150 - 400 K/uL Final   Comment: Immature Platelet Fraction may be clinically indicated, consider ordering this additional test [id number] THIS CRITICAL RESULT HAS VERIFIED AND BEEN CALLED TO BROOK THOMPSON BY CATHI POLLOCK ON 10 29 2019 AT 0942, AND HAS BEEN READ BACK.  THIS CRITICAL RESULT HAS VERIFIED AND BEEN CALLED TO BROOK THOMPSON BY CATHI Gordon ON 10 29 2019 AT 0954, AND HAS BEEN READ BACK.    Marland Kitchen nRBC 01/11/2018 0.7* 0.0 - 0.2 % Final  . Neutrophils Relative % 01/11/2018 60  % Final  . Neutro Abs  01/11/2018 1.9  1.7 - 7.7 K/uL Final  . Lymphocytes Relative 01/11/2018 26  % Final  . Lymphs Abs 01/11/2018 0.8  0.7 - 4.0 K/uL Final  . Monocytes Relative 01/11/2018 11  % Final  . Monocytes Absolute 01/11/2018 0.3  0.1 - 1.0 K/uL Final  . Eosinophils Relative 01/11/2018 0  % Final  . Eosinophils Absolute 01/11/2018 0.0  0.0 - 0.5 K/uL Final  . Basophils Relative 01/11/2018 1  % Final  . Basophils Absolute 01/11/2018 0.0  0.0 - 0.1 K/uL Final  . Immature Granulocytes 01/11/2018 2  % Final  . Abs Immature Granulocytes 01/11/2018 0.06  0.00 - 0.07 K/uL Final   Performed at [facility], [address] [place], [place] [address]  . Tech Review 01/11/2018 TOXIC GRANULATION WITH RARE DOHLE BODIES AND RARE VACULATED NEUTROPHILS   Final   Comment: PLATELETS APPEAR DECREASED RARE NRBC SEEN Performed at [facility], [address], [place], [place] [address]  Assessment:  Edgar [name] is a 27 y.o. male with recurrent testicular cancer.  He presented with a left lower extremity DVT on 09/10/2017.  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  Clinical/pathologic stage is T1 N1-2 S0 M0.  He received 3 cycles of  BEP (12/30/2015 - 02/24/2016).  He received OnPro Neulasta with cycle #2 and #3.  He declined evaluation for retroperitoneal lymph node dissection (RPLND).  He declined sperm banking.  Pre surgery labs on 11/20/2015 revealed an AFP of 411.9, beta-HCG 2,669, and LDH 522 (121-224).    Tumor markers have been followed:   AFP was 411.9 (0-8.3) on 11/20/2015, 11.2 on 12/19/2015, 6.3 on 12/23/2015, 1.3 on 01/27/2016, 1.8 on 02/17/2016, 1.9 on 03/30/2016, 1.4 on 05/27/2016, 1.0 on 07/29/2016, 1.4 on 10/14/2016, 3.3 on 12/24/2016, 1 on 09/10/2017,  1 on 09/20/2017,  2.6 on 10/21/2017, 2.1 on 11/16/2017, and 2.1 on 12/20/2017.    Beta-HCG was 2,669 (0-3) on 11/20/2015, 2 on 12/19/2015, 1 on 12/23/2015, < 1 on 01/27/2016, < 1 on 02/17/2016, < 1 on 03/30/2016, < 1 on 05/27/2016,  < 1 on 07/29/2016, < 1 on 10/14/2016, and 3 on 12/24/2016, < 5 on 09/11/2017,  < 5 on 09/20/2017, < 1 on 10/21/2017, < 1 on 11/16/2017, and < 1 on 12/20/2017.  LDH was 522 (98-192) on 11/20/2015, 179 on 12/19/2015, 160 on 12/23/2015, 202 on 01/27/2016, 156 on 02/10/2016, 161 on 02/17/2016, 172 on 03/30/2016, 178 on 05/27/2016, 150 on 07/29/2016, 152 on 10/14/2016, 145 on 12/24/2016, 1557 on 09/11/2017, 916 on 09/20/2017, 144 on 10/21/2017, 158 on 11/16/2017, 203 on 12/20/2017, and 151 on 12/27/2017.  Chest, abdomen, and pelvic CT on 09/11/2017 revealed enlarged and morphologically abnormal 4.3 x 4.9 cm left pelvic sidewall and retroperitoneal lymph nodes (measuring up to 2.9 cm).  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  There was anasarca/swelling of the left lower extremity.  There was no evidence of metastatic disease in the chest.  Head MRI on 09/13/2017 revealed no evidence of metastatic disease.   He has a left lower extremity DVT.  Bilateral lower extremity duplex on 09/10/2017 revealed evidence of obstruction in the left CFV, SFJ, and proximal femoral vein.  He is on Xarelto.  He received 4 cycles of TIP chemotherapy (09/21/2017 - 12/27/2017) with Margarette Canada support. Cycle #1 was administered at [facility]. Cycles #2 and 3 were administered at [facility].  Both cycles 1 and 2 were complicated by lower extremity skin lesions on recovery of counts.  Audiogram on 10/27/2017 revealed hearing within normal limits.  He underwent IVC filterplacement, percutaneous angioplastyand stent placementof the left common iliac vein and left external iliac vein on 10/13/2017.  He was discharged on Flagyl, doxycycline, and  Levaquin.  Hypercoagulable work-up on 10/12/2017 negative for the following: Factor V Leiden, prothrombin gene mutation, lupus anticoagulant, anticardiolipin antibodies, beta-2 glycoprotein, protein C activity/antigen, protein S activity/antigen. ATIII was 54% (75-120) on heparin.  He has a history of right lower molar abscess s/p extraction on 11/19/2017.  He was treated with amoxicillin.  He stepped on a nail with his right foot.  He was treated with antibiotics for a puncture wound.  He has been followed by podiatry.  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  He is on  prednisone 20 mg daily.  Symptomatically,  patient is fatigued.  He denies any B symptoms.  No nausea or vomiting.  Patient notes some loose stools.  Pain is well controlled with currently prescribed medications.  Appetite decreased due to dysgeusia (metallic taste). LEFT lower extremity wound continues to improve with twice daily wound care and chronic steroids (prednisone 20 mg daily).  Patient scheduled follow-up with dermatology at [facility] on 01/05/2018.  WBC 2000 ([place] 1700).  Hemoglobin 10.5, hematocrit 31.9, and platelets 134,000.  Plan: 1. Labs today:  CBC with diff, CMP, Mg.   2. Recurrent testicular cancer  Doing well overall following cycle #4 TIP chemotherapy.   Discuss blood counts.  Unable to receive LA-GCSF due to Sweet's syndrome.  Review plans for daily Zarxio x 14 days.  Discuss use of loratadine to prevent growth factor induced bone pain.   Review plans for post-treatment imaging to assess effectiveness of chemotherapy.   Discuss possible RPLD based on residual disease noted on imaging.  3. Sweet's syndrome  LEFT lower extremity wound continues to improved with BID wound care.   Continues on chronic steroid therapy (prednisone 20 mg/day).  Scheduled to follow-up with Dr. Tally Joe on 01/05/2018. 4. LEFT lower extremity DVT  Continues on chronic anticoagulation (rivaroxaban) as  previously prescribed.   Discussed plans to follow CBC as an outpatient.  We rivaroxaban previously had to be held due to thrombocytopenia. 5. RIGHT foot tenderness (no puncture)  Dorsal aspect of the right foot nontender.  There are no overt signs of infection.  Patient freely able to move his toes.  Continue routine monitoring due to previous concern for osteomyelitis.  Continue scheduled follow-ups with podiatry Cleda Mccreedy, MD). 6. Electrolyte wasting  Potassium 4.1 and magnesium 2.0.  Continue magnesium and potassium supplementation as previously prescribed.  Continue routine lab monitoring. 7. Chemotherapy-induced neuropathy  Stable neuropathy in his fingertips only.   He notes that his neuropathy does not impose any functional limitations.  Continue routine monitoring for symptom progression. 8. Pain and symptom management Patient has antiemetics and pain medications at home to use on a PRN basis. Patient  advising that the  prescribed interventions are adequate at this point. Continue all medications as previously prescribed.  Followed up on clonazepam refills, as  [place] indicated that on the oxycodone prescription from recent admission had been filled. Patient states, "oh the pharmacy told me that I can't get the klonopin until 01/10/2018".  9. RTC daily ([place]) for Zarxio injections x 13 days (weekends included).  10. RTC on 01/07/2018 for labs (CBC with differential) and Zarxio injection. 11. RTC on 01/12/2018 for MD assessment, labs (CBC with differential, CMP, Mg), and Zarxio injection.   [name], MD  01/12/2018, 4:29 AM

## 2018-01-12 NOTE — ED Notes (Signed)
Pt resting quietly on stretcher with lights off to enhance rest. No distress noted at this time. Eyes closed and even respirations.

## 2018-01-12 NOTE — Progress Notes (Signed)
Pt complains of pain 7/10 and states that Oxy IR is not working.  Spoke with Dr Melrose Nakayama and MS ordered and given. Dorna Bloom RN

## 2018-01-12 NOTE — Progress Notes (Addendum)
[place] [facility] [place] NAME: Edgar [name]    MR#:  [id number]  DATE OF BIRTH:  Oct 17, 1990  SUBJECTIVE:  No concerns this morning.  Denies any bleeding from his gums or into stool.  Endorses chronic fatigue.  Denies any chest pain, palpitations, or lightheadedness.  REVIEW OF SYSTEMS:  Review of Systems  Constitutional: Negative for chills and fever.  HENT: Negative for congestion and sore throat.   Eyes: Negative for blurred vision and double vision.  Respiratory: Negative for cough and shortness of breath.   Cardiovascular: Negative for chest pain, palpitations and leg swelling.  Gastrointestinal: Negative for abdominal pain, blood in stool, melena, nausea and vomiting.  Genitourinary: Negative for dysuria and hematuria.  Musculoskeletal: Negative for back pain and neck pain.  Neurological: Negative for dizziness and headaches.  Psychiatric/Behavioral: Negative for depression. The patient is not nervous/anxious.    DRUG ALLERGIES:  No Known Allergies VITALS:  Blood pressure 122/78, pulse 81, temperature (!) 97.4 F (36.3 C), temperature source Oral, resp. rate 20, height 6\' 3"  (1.905 m), weight 124.7 kg, SpO2 97 %. PHYSICAL EXAMINATION:  Physical Exam  Constitutional: He is oriented to person, place, and time. No distress.  HEENT: Normocephalic, atraumatic, EOMI, no scleral icterus, moist mucous membranes. +conjunctival pallor Neck: Normal range of motion. Neck supple. No JVD present. No tracheal deviation present.  Cardiovascular: RRR, no murmurs, rubs, gallops Pulmonary/Chest: CTAB, no wheezing, normal work of breathing.  Abdominal: Soft. Bowel sounds are normal. He exhibits no distension and no mass. There is no tenderness. There is no rebound and no guarding.  Musculoskeletal: Normal range of motion. He exhibits no edema.  Neurological: He is alert and oriented to person, place, and time.  Skin: Skin is warm. No rash noted. No  erythema.  Psychiatric: Judgment normal. Normal affect. LABORATORY PANEL:  Male CBC Recent Labs  Lab 01/12/18 0444 01/12/18 1209  WBC 3.5*  --   HGB 7.3*  --   HCT 22.9*  --   PLT 7* 25*   ------------------------------------------------------------------------------------------------------------------ Chemistries  Recent Labs  Lab 01/07/18 0940 01/11/18 1925  NA 138 138  K 3.9 3.5  CL 106 104  CO2 23 28  GLUCOSE 103* 108*  BUN 15 21*  CREATININE 0.81 0.76  CALCIUM 9.1 8.7*  MG 1.7  --   AST 13*  --   ALT 18  --   ALKPHOS 41  --   BILITOT 0.4  --    RADIOLOGY:  No results found. ASSESSMENT AND PLAN:   1.  Thrombocytopenia: Likely due to recent chemotherapy.  Platelets improved to 25 s/p platelet transfusion. -Recheck CBC in the morning -Oncology folowing  2.  Acute on chronic anemia due to chemotherapy: Hgb dropped from 8.2 to 7.3. -Will give 1 unit pRBCs per oncology recommendation -Check retic panel -Recheck CBC in the morning  3. Tobacco dependence: Patient is encouraged to quit smoking. Counseling was provided this admission.  4.  Testicular cancer with recent chemotherapy: Management as per oncology  All the records are reviewed and case discussed with Care Management/Social Worker. Management plans discussed with the patient, family and they are in agreement.  CODE STATUS: Full Code  TOTAL TIME TAKING CARE OF THIS PATIENT: 33 minutes.   More than 50% of the time was spent in counseling/coordination of care: YES  POSSIBLE D/C tomorrow, DEPENDING ON CLINICAL CONDITION.   Berna Spare Marlena Barbato M.D on 01/12/2018 at 2:15 PM  Between 7am to 6pm -  Pager - [phone]  After 6pm go to [email or web address] - Proofreader  [facility]  Office  [phone]  CC: Primary care physician; [name], MD  Note: This dictation was prepared with Dragon dictation along with smaller phrase technology. Any transcriptional errors  that result from this process are unintentional.

## 2018-01-13 ENCOUNTER — Inpatient Hospital Stay: Payer: BLUE CROSS/BLUE SHIELD

## 2018-01-13 LAB — CBC
HCT: 25.7 % — ABNORMAL LOW (ref 39.0–52.0)
HEMOGLOBIN: 8.8 g/dL — AB (ref 13.0–17.0)
MCH: 32 pg (ref 26.0–34.0)
MCHC: 34.2 g/dL (ref 30.0–36.0)
MCV: 93.5 fL (ref 80.0–100.0)
PLATELETS: 24 10*3/uL — AB (ref 150–400)
RBC: 2.75 MIL/uL — AB (ref 4.22–5.81)
RDW: 15.7 % — ABNORMAL HIGH (ref 11.5–15.5)
WBC: 7.3 10*3/uL (ref 4.0–10.5)
nRBC: 0.5 % — ABNORMAL HIGH (ref 0.0–0.2)

## 2018-01-13 LAB — TYPE AND SCREEN
ABO/RH(D): A POS
ANTIBODY SCREEN: NEGATIVE
Unit division: 0

## 2018-01-13 LAB — PREPARE PLATELET PHERESIS: Unit division: 0

## 2018-01-13 LAB — BPAM RBC
Blood Product Expiration Date: 201911052359
ISSUE DATE / TIME: 201910301531
UNIT TYPE AND RH: 6200

## 2018-01-13 LAB — BPAM PLATELET PHERESIS
Blood Product Expiration Date: 201911012359
ISSUE DATE / TIME: 201910300616
Unit Type and Rh: 6200

## 2018-01-13 MED ORDER — MORPHINE SULFATE (PF) 2 MG/ML IV SOLN: 2.0000 mg | INTRAVENOUS | Status: DC | PRN

## 2018-01-13 MED ORDER — OXYCODONE HCL 10 MG PO TABS: 10.0000 mg | ORAL_TABLET | Freq: Three times a day (TID) | ORAL | 0 refills | Status: DC | PRN

## 2018-01-13 MED ORDER — HYDROMORPHONE HCL 1 MG/ML IJ SOLN
1.0000 mg | INTRAMUSCULAR | Status: DC | PRN
  Administered 2018-01-13 (×2): 1 mg via INTRAVENOUS
  Filled 2018-01-13 (×2): qty 1

## 2018-01-13 MED ORDER — HEPARIN SOD (PORK) LOCK FLUSH 100 UNIT/ML IV SOLN
500.0000 [IU] | Freq: Once | INTRAVENOUS | Status: AC
  Administered 2018-01-13: 10:00:00 500 [IU] via INTRAVENOUS
  Filled 2018-01-13: qty 5

## 2018-01-13 NOTE — Discharge Instructions (Signed)
It was so nice to meet you during this hospitalization!  We gave you platelet and blood transfusions while you were here. Your platelets and hemoglobin have not dropped again this morning.  Please make sure you follow-up with Dr. Mike Gip to recheck your labs.  -Dr. Brett Albino

## 2018-01-13 NOTE — Discharge Summary (Signed)
[place] [facility] [place] NAME: Edgar [name]    MR#:  [id number]  DATE OF BIRTH:  09-20-1990  DATE OF ADMISSION:  01/11/2018   ADMITTING PHYSICIAN: Bettey Costa, MD  DATE OF DISCHARGE: 01/13/2018 11:15 AM  PRIMARY CARE PHYSICIAN: [name], MD   ADMISSION DIAGNOSIS:  Thrombocytopenia ([facility]) [D69.6] Anemia, unspecified type [D64.9] DISCHARGE DIAGNOSIS:  Active Problems:   Thrombocytopenia ([place])  SECONDARY DIAGNOSIS:   Past Medical History:  Diagnosis Date  . Asthma    AS A CHILD-NO INHALERS  . Cancer ([place])    testicular cancer 11/2016 per pt   . DVT (deep venous thrombosis) ([facility])   . GERD (gastroesophageal reflux disease)    TUMS PRN   HOSPITAL COURSE:   Edgar [name] is a 27 year old male with a past medical history of testicular cancer currently undergoing chemotherapy who presented to the ED from the oncology office due to severe thrombocytopenia.  Initial platelet count was 6.  He underwent platelet transfusion with improvement of his platelets to 25.  Hemoglobin dropped from 8.2 to 7.3 and he was given 1 unit pRBCs.  His anemia and thrombocytopenia were felt to be secondary to his chemotherapy.  He did not have any active bleeding.  His platelets and hemoglobin were stable on the day of discharge.  He was discharged home with close oncology follow-up.  DISCHARGE CONDITIONS:  Testicular cancer Asthma History DVT GERD CONSULTS OBTAINED:  Treatment Team:  Earlie Server, MD DRUG ALLERGIES:  No Known Allergies DISCHARGE MEDICATIONS:   Allergies as of 01/13/2018   No Known Allergies     Medication List    STOP taking these medications   XARELTO 20 MG Tabs tablet Generic drug:  rivaroxaban     TAKE these medications   acyclovir 400 MG tablet Commonly known as:  ZOVIRAX Take 400 mg by mouth 2 (two) times daily.   clobetasol ointment 0.05 % Commonly known as:  TEMOVATE Apply 1 application topically 2 (two) times daily  as needed (bandage changes).   clonazePAM 0.5 MG disintegrating tablet Commonly known as:  KLONOPIN Take 1 tablet (0.5 mg total) by mouth 2 (two) times daily as needed (anxiety).   gabapentin 300 MG capsule Commonly known as:  NEURONTIN Take 300 mg by mouth at bedtime.   loratadine 10 MG tablet Commonly known as:  CLARITIN Take 1 tablet (10 mg total) by mouth daily.   magnesium oxide 400 MG tablet Commonly known as:  MAG-OX Take 400 mg by mouth daily.   omeprazole 20 MG capsule Commonly known as:  PRILOSEC Take 1 capsule (20 mg total) by mouth daily.   Oxycodone HCl 10 MG Tabs Take 1 tablet (10 mg total) by mouth every 8 (eight) hours as needed.   potassium chloride 10 MEQ tablet Commonly known as:  K-DUR Take 10 mEq by mouth 2 (two) times daily.   predniSONE 20 MG tablet Commonly known as:  DELTASONE Take 1 tablet (20 mg total) by mouth daily. What changed:    medication strength  how much to take  when to take this  Another medication with the same name was removed. Continue taking this medication, and follow the directions you see here.        DISCHARGE INSTRUCTIONS:  1.  Follow-up with oncology tomorrow 2.  Holding Xarelto due to severe thrombocytopenia 3.  Recheck labs as an outpatient DIET:  Regular diet DISCHARGE CONDITION:  Stable ACTIVITY:  Activity as tolerated OXYGEN:  Home  Oxygen: No.  Oxygen Delivery: room air DISCHARGE LOCATION:  home   If you experience worsening of your admission symptoms, develop shortness of breath, life threatening emergency, suicidal or homicidal thoughts you must seek medical attention immediately by calling 911 or calling your MD immediately  if symptoms less severe.  You Must read complete instructions/literature along with all the possible adverse reactions/side effects for all the Medicines you take and that have been prescribed to you. Take any new Medicines after you have completely understood and accpet all  the possible adverse reactions/side effects.   Please note  You were cared for by a hospitalist during your hospital stay. If you have any questions about your discharge medications or the care you received while you were in the hospital after you are discharged, you can call the unit and asked to speak with the hospitalist on call if the hospitalist that took care of you is not available. Once you are discharged, your primary care physician will handle any further medical issues. Please note that NO REFILLS for any discharge medications will be authorized once you are discharged, as it is imperative that you return to your primary care physician (or establish a relationship with a primary care physician if you do not have one) for your aftercare needs so that they can reassess your need for medications and monitor your lab values.    On the day of Discharge:  VITAL SIGNS:  Blood pressure 138/86, pulse 84, temperature 98.6 F (37 C), temperature source Oral, resp. rate 16, height 6\' 3"  (1.905 m), weight 124.7 kg, SpO2 93 %. PHYSICAL EXAMINATION:  GENERAL:  27 y.o.-year-old patient lying in the bed with no acute distress.  EYES: Pupils equal, round, reactive to light and accommodation. No scleral icterus. Extraocular muscles intact.  HEENT: Head atraumatic, normocephalic. Oropharynx and nasopharynx clear.  NECK:  Supple, no jugular venous distention. No thyroid enlargement, no tenderness.  LUNGS: Normal breath sounds bilaterally, no wheezing, rales,rhonchi or crepitation. No use of accessory muscles of respiration.  CARDIOVASCULAR: S1, S2 normal. No murmurs, rubs, or gallops.  ABDOMEN: Soft, non-tender, non-distended. Bowel sounds present. No organomegaly or mass.  EXTREMITIES: No pedal edema, cyanosis, or clubbing.  NEUROLOGIC: Cranial nerves II through XII are intact. Muscle strength 5/5 in all extremities. Sensation intact. Gait not checked.  PSYCHIATRIC: The patient is alert and oriented x  3.  SKIN: No obvious rash, lesion, or ulcer.  DATA REVIEW:   CBC Recent Labs  Lab 01/13/18 0554  WBC 7.3  HGB 8.8*  HCT 25.7*  PLT 24*    Chemistries  Recent Labs  Lab 01/07/18 0940 01/11/18 1925  NA 138 138  K 3.9 3.5  CL 106 104  CO2 23 28  GLUCOSE 103* 108*  BUN 15 21*  CREATININE 0.81 0.76  CALCIUM 9.1 8.7*  MG 1.7  --   AST 13*  --   ALT 18  --   ALKPHOS 41  --   BILITOT 0.4  --      Microbiology Results  Results for orders placed or performed during the hospital encounter of 12/07/17  Urine Culture     Status: None   Collection Time: 12/07/17  3:00 AM  Result Value Ref Range Status   Specimen Description   Final    URINE, CLEAN CATCH Performed at [facility], [address] [place], [place] [address]    Special Requests   Final    Normal Performed at [facility], [place]  [address] [place], [place] [address]    Culture   Final    NO GROWTH Performed at [facility], [place] [address], [place], [place] [address]    Report Status 12/09/2017 FINAL  Final  CULTURE, BLOOD (ROUTINE X 2) w Reflex to ID Panel     Status: None   Collection Time: 12/07/17  5:41 PM  Result Value Ref Range Status   Specimen Description BLOOD RAC  Final   Special Requests   Final    BOTTLES DRAWN AEROBIC AND ANAEROBIC Blood Culture adequate volume   Culture   Final    NO GROWTH 5 DAYS Performed at [facility], [address] [place], [place] [address]    Report Status 12/12/2017 FINAL  Final  CULTURE, BLOOD (ROUTINE X 2) w Reflex to ID Panel     Status: None   Collection Time: 12/07/17  7:28 PM  Result Value Ref Range Status   Specimen Description BLOOD RIGHT ARM  Final   Special Requests   Final    BOTTLES DRAWN AEROBIC AND ANAEROBIC Blood Culture results may not be optimal due to an excessive volume of blood received in culture bottles   Culture   Final    NO GROWTH 5 DAYS Performed at [facility], [address]  [place], [place] [address]    Report Status 12/12/2017 FINAL  Final  Aerobic Culture (superficial specimen)     Status: None   Collection Time: 12/08/17 10:25 AM  Result Value Ref Range Status   Specimen Description   Final    EAR RIGHT Performed at [facility], [address], [place], [place] [address]    Special Requests   Final    Immunocompromised Performed at [facility], [address] [place], [place] [address]    Gram Stain   Final    NO WBC SEEN NO ORGANISMS SEEN Performed at [facility], [place] [address], [place], [place] [address]    Culture FEW STAPHYLOCOCCUS EPIDERMIDIS  Final   Report Status 12/12/2017 FINAL  Final   Organism ID, Bacteria STAPHYLOCOCCUS EPIDERMIDIS  Final      Susceptibility   Staphylococcus epidermidis - MIC*    CIPROFLOXACIN >=8 RESISTANT Resistant     ERYTHROMYCIN <=0.25 SENSITIVE Sensitive     GENTAMICIN <=0.5 SENSITIVE Sensitive     OXACILLIN >=4 RESISTANT Resistant     TETRACYCLINE >=16 RESISTANT Resistant     VANCOMYCIN 2 SENSITIVE Sensitive     TRIMETH/SULFA <=10 SENSITIVE Sensitive     CLINDAMYCIN >=8 RESISTANT Resistant     RIFAMPIN <=0.5 SENSITIVE Sensitive     Inducible Clindamycin NEGATIVE Sensitive     * FEW STAPHYLOCOCCUS EPIDERMIDIS  CULTURE, BLOOD (ROUTINE X 2) w Reflex to ID Panel     Status: None   Collection Time: 12/09/17  9:06 AM  Result Value Ref Range Status   Specimen Description BLOOD RIGHT HAND  Final   Special Requests   Final    BOTTLES DRAWN AEROBIC AND ANAEROBIC Blood Culture results may not be optimal due to an excessive volume of blood received in culture bottles   Culture   Final    NO GROWTH 5 DAYS Performed at [facility], [address] [place], [place] [address]    Report Status 12/14/2017 FINAL  Final  CULTURE, BLOOD (ROUTINE X 2) w Reflex to ID Panel     Status: None   Collection Time: 12/09/17  9:16 AM  Result Value Ref Range Status   Specimen Description BLOOD  LEFT AC  Final   Special Requests   Final    BOTTLES DRAWN AEROBIC AND ANAEROBIC Blood Culture adequate volume   Culture   Final    NO GROWTH 5 DAYS Performed at [facility], [address] [place], [place] [address]    Report Status 12/14/2017 FINAL  Final  Aerobic Culture (superficial specimen)     Status: None   Collection Time: 12/10/17  1:35 PM  Result Value Ref Range Status   Specimen Description   Final    WOUND Performed at [facility], [address] [place], [place] [address]    Special Requests   Final    Immunocompromised Performed at [facility], [address] [place], [place] [address]    Gram Stain NO WBC SEEN NO ORGANISMS SEEN   Final   Culture   Final    NO GROWTH 3 DAYS Performed at [facility], [place] [address], [place], [place] [address]    Report Status 12/13/2017 FINAL  Final    RADIOLOGY:  No results found.   Management plans discussed with the patient, family and they are in agreement.  CODE STATUS: Full Code   TOTAL TIME TAKING CARE OF THIS PATIENT: 35 minutes.    Berna Spare Jabarie Pop M.D on 01/13/2018 at 1:10 PM  Between 7am to 6pm - Pager - [phone]  After 6pm go to [email or web address] - Proofreader  [facility]  Office  [phone]  CC: Primary care physician; [name], MD   Note: This dictation was prepared with Dragon dictation along with smaller phrase technology. Any transcriptional errors that result from this process are unintentional.

## 2018-01-14 ENCOUNTER — Inpatient Hospital Stay (HOSPITAL_BASED_OUTPATIENT_CLINIC_OR_DEPARTMENT_OTHER): Payer: BLUE CROSS/BLUE SHIELD | Admitting: Hematology and Oncology

## 2018-01-14 ENCOUNTER — Inpatient Hospital Stay: Payer: BLUE CROSS/BLUE SHIELD

## 2018-01-14 ENCOUNTER — Inpatient Hospital Stay: Payer: BLUE CROSS/BLUE SHIELD | Attending: Hematology and Oncology

## 2018-01-14 VITALS — BP 137/80 | HR 61 | Temp 97.8°F | Resp 18 | Wt 263.0 lb

## 2018-01-14 DIAGNOSIS — F1290 Cannabis use, unspecified, uncomplicated: Secondary | ICD-10-CM

## 2018-01-14 DIAGNOSIS — D6481 Anemia due to antineoplastic chemotherapy: Secondary | ICD-10-CM

## 2018-01-14 DIAGNOSIS — F064 Anxiety disorder due to known physiological condition: Secondary | ICD-10-CM | POA: Insufficient documentation

## 2018-01-14 DIAGNOSIS — Z86718 Personal history of other venous thrombosis and embolism: Secondary | ICD-10-CM | POA: Diagnosis not present

## 2018-01-14 DIAGNOSIS — Z7901 Long term (current) use of anticoagulants: Secondary | ICD-10-CM

## 2018-01-14 DIAGNOSIS — F17210 Nicotine dependence, cigarettes, uncomplicated: Secondary | ICD-10-CM | POA: Insufficient documentation

## 2018-01-14 DIAGNOSIS — C6292 Malignant neoplasm of left testis, unspecified whether descended or undescended: Secondary | ICD-10-CM | POA: Insufficient documentation

## 2018-01-14 DIAGNOSIS — T451X5A Adverse effect of antineoplastic and immunosuppressive drugs, initial encounter: Secondary | ICD-10-CM | POA: Insufficient documentation

## 2018-01-14 DIAGNOSIS — Z8547 Personal history of malignant neoplasm of testis: Secondary | ICD-10-CM

## 2018-01-14 DIAGNOSIS — L982 Febrile neutrophilic dermatosis [Sweet]: Secondary | ICD-10-CM | POA: Insufficient documentation

## 2018-01-14 DIAGNOSIS — Z79899 Other long term (current) drug therapy: Secondary | ICD-10-CM | POA: Diagnosis not present

## 2018-01-14 DIAGNOSIS — I82412 Acute embolism and thrombosis of left femoral vein: Secondary | ICD-10-CM | POA: Insufficient documentation

## 2018-01-14 DIAGNOSIS — D696 Thrombocytopenia, unspecified: Secondary | ICD-10-CM

## 2018-01-14 DIAGNOSIS — Z9221 Personal history of antineoplastic chemotherapy: Secondary | ICD-10-CM | POA: Diagnosis not present

## 2018-01-14 DIAGNOSIS — Z7189 Other specified counseling: Secondary | ICD-10-CM

## 2018-01-14 DIAGNOSIS — I824Y2 Acute embolism and thrombosis of unspecified deep veins of left proximal lower extremity: Secondary | ICD-10-CM

## 2018-01-14 DIAGNOSIS — Z9079 Acquired absence of other genital organ(s): Secondary | ICD-10-CM | POA: Diagnosis not present

## 2018-01-14 DIAGNOSIS — G620 Drug-induced polyneuropathy: Secondary | ICD-10-CM | POA: Diagnosis not present

## 2018-01-14 DIAGNOSIS — Z7952 Long term (current) use of systemic steroids: Secondary | ICD-10-CM | POA: Diagnosis not present

## 2018-01-14 DIAGNOSIS — C6212 Malignant neoplasm of descended left testis: Secondary | ICD-10-CM

## 2018-01-14 DIAGNOSIS — E876 Hypokalemia: Secondary | ICD-10-CM

## 2018-01-14 LAB — COMPREHENSIVE METABOLIC PANEL
ALT: 14 U/L (ref 0–44)
AST: 13 U/L — ABNORMAL LOW (ref 15–41)
Albumin: 3.8 g/dL (ref 3.5–5.0)
Alkaline Phosphatase: 54 U/L (ref 38–126)
Anion gap: 7 (ref 5–15)
BUN: 21 mg/dL — ABNORMAL HIGH (ref 6–20)
CO2: 34 mmol/L — ABNORMAL HIGH (ref 22–32)
Calcium: 9 mg/dL (ref 8.9–10.3)
Chloride: 99 mmol/L (ref 98–111)
Creatinine, Ser: 0.99 mg/dL (ref 0.61–1.24)
GFR calc Af Amer: >60 mL/min (ref >60)
GFR calc non Af Amer: >60 mL/min (ref >60)
Glucose, Bld: 75 mg/dL (ref 70–99)
Potassium: 3.9 mmol/L (ref 3.5–5.1)
Sodium: 140 mmol/L (ref 135–145)
Total Bilirubin: 0.2 mg/dL — ABNORMAL LOW (ref 0.3–1.2)
Total Protein: 6.5 g/dL (ref 6.5–8.1)

## 2018-01-14 LAB — CBC WITH DIFFERENTIAL/PLATELET
Band Neutrophils: 5 %
Basophils Absolute: 0 10*3/uL (ref 0.0–0.1)
Basophils Relative: 0 %
Blasts: 0 %
Eosinophils Absolute: 0 10*3/uL (ref 0.0–0.5)
Eosinophils Relative: 0 %
HCT: 27.7 % — ABNORMAL LOW (ref 39.0–52.0)
Hemoglobin: 9.1 g/dL — ABNORMAL LOW (ref 13.0–17.0)
Lymphocytes Relative: 12 %
Lymphs Abs: 1.1 10*3/uL (ref 0.7–4.0)
MCH: 31.4 pg (ref 26.0–34.0)
MCHC: 32.9 g/dL (ref 30.0–36.0)
MCV: 95.5 fL (ref 80.0–100.0)
Metamyelocytes Relative: 5 %
Monocytes Absolute: 0.8 10*3/uL (ref 0.1–1.0)
Monocytes Relative: 9 %
Myelocytes: 11 %
Neutro Abs: 6.9 10*3/uL (ref 1.7–7.7)
Neutrophils Relative %: 53 %
Other: 3 %
Platelets: 38 10*3/uL — ABNORMAL LOW (ref 150–400)
Promyelocytes Relative: 2 %
RBC: 2.9 MIL/uL — ABNORMAL LOW (ref 4.22–5.81)
RDW: 15.7 % — ABNORMAL HIGH (ref 11.5–15.5)
WBC: 9.1 10*3/uL (ref 4.0–10.5)
nRBC: 0.4 % — ABNORMAL HIGH (ref 0.0–0.2)
nRBC: 2 /100 WBC — ABNORMAL HIGH

## 2018-01-14 LAB — PATHOLOGIST SMEAR REVIEW

## 2018-01-14 LAB — MAGNESIUM: Magnesium: 1.9 mg/dL (ref 1.7–2.4)

## 2018-01-14 NOTE — Progress Notes (Signed)
Edgar [name] day:  01/14/2018  Chief Complaint: Edgar [name] is a 27 y.o. male with recurrent testicular cancer who is seen for assessment on day 19 of cycle #4 TIP chemotherapy.  HPI: The patient was last seen in the medical oncology [name] on 01/03/2018.  At that time, he was day 8 s/p cycle #4 TIP chemotherapy. Symptomatically, he was fatigued.  He denied any B symptoms.  No nausea or vomiting.  Patient notes some loose stools.  Pain was well controlled.  Appetite was decreased due to dysgeusia (metallic taste). LEFT lower extremity wound continues to improve with twice daily wound care and chronic steroids (prednisone 20 mg daily).  WBC 2000 ([place] 1700).  Hemoglobin 10.5, hematocrit 31.9, and platelets 134,000.  He began daily Zarxio.   Patient scheduled follow-up with Edgar [name], dermatologist at [facility], on 01/05/2018. He was to decrease his prednisone to 10 mg a day x 7 days then 10 mg QOD (on 11/06) x 7 days.  He was to continue clobetasol 0.05% ointment to the ulcer until resolved.  He has a follow-up on 01/19/2018.  He has had follow-up counts: 01/07/2018:  Hematocrit 29.9, hemoglobin 9.8, platelets 29,000, WBC 400   with an ANC of 0. 01/11/2018:  Hematocrit 25.0, hemoglobin 8.2, platelets   6,000, WBC 3100 with an ANC of 1900. 01/12/2018:  Hematocrit 22.9, hemoglobin 7.3, platelets   7,000, WBC 3500.  He was instructed to stop his Xarelto on 01/07/2018.  He left [name] on 01/11/2018 and declined a platelet transfusion secondary to personal plans.  He returned to the [facility] ER in the evening on 01/11/2018 and was admitted for transfusion.  He received 1 unit of platelets and 1 unit of PRBCs.  Post platelet count was 25,000.  He was discharged on 01/13/2018 with a hematocrit of 25.7. Hemoglobin 8.8, platelets 24,000, and WBC 7300.  He last received Zarxio on 01/12/2018.  During the interim, patient is doing well. Energy is slowly returning. He  attributes his delayed recovery to the daily Zarxio injections. Patient complains of significant pain in his back and legs. He notes that his feet "burn" if he walks on anything other than carpet. No nausea, vomiting, or changes to his bowel habits. Patient denies that he has experienced any B symptoms. He denies any interval infections.  Wound to left lower extremity stable, with no increased pain or drainage noted.  Patient continues daily wound care.  Patient advises that he maintains an adequate appetite. He is eating better.  Dysgeusia has improved. Weight today is 263 lb (119.3 kg), which compared to his last visit to the [name], represents an 8 pound increase.  Patient complains of pain rated 5/10 in the [name] today.   Past Medical History:  Diagnosis Date  . Asthma    AS A CHILD-NO INHALERS  . Cancer ([place])    testicular cancer 11/2016 per pt   . DVT (deep venous thrombosis) ([place])   . GERD (gastroesophageal reflux disease)    TUMS PRN    Past Surgical History:  Procedure Laterality Date  . ANKLE FRACTURE SURGERY Right 2004  . FINE NEEDLE ASPIRATION BIOPSY N/A 11/09/2017   Procedure: FINE NEEDLE ASPIRATION BIOPSY;  Surgeon: [name], MD;  Location: [place] [facility];  Service: Cardiovascular;  Laterality: N/A;  . IVC FILTER INSERTION    . ORCHIECTOMY Left 11/26/2015   Procedure: ORCHIECTOMY;  Surgeon: [name], MD;  Location: [facility] ORS;  Service: Urology;  Laterality: Left;  radical/Inguinal approach  . PERIPHERAL VASCULAR CATHETERIZATION N/A 12/16/2015   Procedure: [name] Cath Insertion;  Surgeon: [name], MD;  Location: [place] [facility];  Service: Cardiovascular;  Laterality: N/A;  . PERIPHERAL VASCULAR THROMBECTOMY Left 10/13/2017   Procedure: PERIPHERAL VASCULAR THROMBECTOMY;  Surgeon: [name], MD;  Location: [place] [facility];  Service: Cardiovascular;  Laterality: Left;  . WISDOM TOOTH EXTRACTION  2017    Family History  Problem  Relation Age of Onset  . Skin cancer Mother   . Breast cancer Maternal Grandmother   . Colon cancer Maternal Grandfather   . Diabetes Maternal Grandfather   . Emphysema Father   . Mental illness Sister        anxiety  . Stroke Paternal Grandmother   . Prostate cancer Neg Hx   . Kidney cancer Neg Hx   . Bladder Cancer Neg Hx     Social History:  reports that he has been smoking cigarettes. He has a 4.00 pack-year smoking history. He has quit using smokeless tobacco.  His smokeless tobacco use included snuff. He reports that he has current or past drug history. Drug: Marijuana. He reports that he does not drink alcohol.  He smokes 1/2 pack a day.  He smokes marijuana.  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  He has a 14 year-old-daughter named Edgar [name]. The patient's mother's cell phone number is [phone].  Another contact number is [phone].  The patient lives in [place]. He is alone today.   Allergies: No Known Allergies  Current Medications: Current Outpatient Medications  Medication Sig Dispense Refill  . acyclovir (ZOVIRAX) 400 MG tablet Take 400 mg by mouth 2 (two) times daily.    . clobetasol ointment (TEMOVATE) 3.42 % Apply 1 application topically 2 (two) times daily as needed (bandage changes).     . clonazePAM (KLONOPIN) 0.5 MG disintegrating tablet Take 1 tablet (0.5 mg total) by mouth 2 (two) times daily as needed (anxiety). 30 tablet 0  . gabapentin (NEURONTIN) 300 MG capsule Take 300 mg by mouth at bedtime.    Marland Kitchen loratadine (CLARITIN) 10 MG tablet Take 1 tablet (10 mg total) by mouth daily. 30 tablet 1  . magnesium oxide (MAG-OX) 400 MG tablet Take 400 mg by mouth daily.    Marland Kitchen omeprazole (PRILOSEC) 20 MG capsule Take 1 capsule (20 mg total) by mouth daily. 30 capsule 1  . Oxycodone HCl 10 MG TABS Take 1 tablet (10 mg total) by mouth every 8 (eight) hours as needed. 20 tablet 0  . potassium chloride (K-DUR) 10 MEQ tablet Take 10 mEq by mouth 2 (two)  times daily.    . predniSONE (DELTASONE) 20 MG tablet Take 1 tablet (20 mg total) by mouth daily. 30 tablet 0  . XARELTO 20 MG TABS tablet Take 20 mg by mouth daily.     No current facility-administered medications for this visit.    Facility-Administered Medications Ordered in Other Visits  Medication Dose Route Frequency Provider Last Rate Last Dose  . filgrastim-sndz (ZARXIO) injection 480 mcg  480 mcg Subcutaneous Once [name], MD      . Karle Barr [facility]) injection 480 mcg  480 mcg Subcutaneous Once [name], MD        Review of Systems  Constitutional: Positive for malaise/fatigue (Improving). Negative for diaphoresis, fever and weight loss (up 8 pounds).  HENT: Negative.        Dysgeusia improving  Eyes: Negative.   Respiratory: Negative for cough, hemoptysis, sputum production and shortness of breath.   Cardiovascular: Negative for chest pain, palpitations, orthopnea, leg swelling and PND.  Gastrointestinal: Negative for abdominal pain, blood in stool, constipation, diarrhea, melena, nausea and vomiting.  Genitourinary: Negative for dysuria, frequency, hematuria and urgency.  Musculoskeletal: Positive for back pain and myalgias. Negative for falls and joint pain.  Skin: Negative for itching and rash.       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 Negative for dizziness, tremors, weakness and headaches.  Endo/Heme/Allergies: Does not bruise/bleed easily.  Psychiatric/Behavioral: Negative for depression, memory loss and suicidal ideas. The patient is nervous/anxious and has insomnia (improving).   All other systems reviewed and are negative.  Performance status (ECOG): 1 - 2  Vital Signs BP 137/80 (BP Location: Left Arm, Patient Position: Sitting)   Pulse 61   Temp 97.8 F (36.6 C) (Tympanic)   Resp 18   Wt 263 lb (119.3 kg)   BMI 32.87 kg/m    Physical Exam  Constitutional: He is oriented to person, place, and time and well-developed, well-nourished, and in no distress.  HENT:  Head: Normocephalic and atraumatic. Hair is abnormal (chemotherapy induced alopecia).  Mouth/Throat: Oropharynx is clear and moist and mucous membranes are normal. Abnormal dentition (poor).  Eyes: Pupils are equal, round, and reactive to light. EOM are normal. No scleral icterus.  Neck: Normal range of motion. Neck supple. No tracheal deviation present. No thyromegaly present.  Cardiovascular: Normal rate, regular rhythm, normal heart sounds and intact distal pulses. Exam reveals no gallop and no friction rub.  No murmur heard. Pulmonary/Chest: Effort normal and breath sounds normal. No respiratory distress. He has no wheezes. He has no rales.  Abdominal: Soft. Bowel sounds are normal. He exhibits no distension. There is no tenderness.  Musculoskeletal: Normal range of motion. He exhibits edema (BILATERAL lower extremities). He exhibits no tenderness.  Lymphadenopathy:    He has no cervical adenopathy.    He has no axillary adenopathy.       Right: No supraclavicular adenopathy present.       Left: No supraclavicular adenopathy present.  Neurological: He is alert and oriented to person, place, and time.  Skin: Skin is warm and dry. No rash noted. No erythema.     Psychiatric: Mood, affect and judgment normal.  Nursing note and vitals reviewed.   Medical photography: Lesion to posterior aspect of LEFT lower extremity                  01/14/2018                    12/27/2017                    12/20/2017                     12/17/2017                    Imaging studies: 11/28/2015:  Chest, abdomen, and pelvic CT revealed no mediastinal adenopathy or suspicious pulmonary nodules.   There were 2 prominent left periaortic retroperitoneal lymph nodes (1.7 cm and 0.9 cm) concerning for testicular nodal metastasis.  There were no abnormal lymph nodes  above the renal veins.  There were postsurgical change in the left hemiscrotum and left inguinal canal.  There was a 3.3 cm soft tissue  abnormality anterior to the left iliopsoas muscle which could represent a metastatic lymph node (N2 lesion).  12/17/2015:  Head MRI revealed no evidence of metastatic disease.   03/30/2016:  Chest, abdomen, and pelvic CT revealed interval resolution of left abdominal/pelvic lymphadenopathy.  There was no residual metastatic disease. 09/30/2016:  Chest, abdomen, and pelvic CT revealed no evidence of recurrent or metastatic disease.  09/10/2017:  Bilateral lower extremity duplex revealed evidence of obstruction in the left CFV, SFJ, and proximal femoral vein.  09/10/2017:  CT pelvic venogram revealed multiple retroperitoneal masses including large mass within the left iliacus concerning for metastatic disease in the setting of prior testicular cancer.  Mass in the left iliacus caused significant narrowing of the proximal left external iliac vein. There was filling defect within the distal left external iliac vein extending into the common femoral and proximal superficial femoral veins consistent with thrombus  09/11/2017:  Chest, abdomen, and pelvic CT revealed enlarged and morphologically abnormal 4.3 x 4.9 cm left pelvic sidewall and retroperitoneal lymph nodes (measuring up to 2.9 cm) concerning for metastatic disease given history of testicular cancer. Biopsy was suggested for confirmation. Prominent left inguinal lymph nodes could be metastatic or reactive in nature.  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  There was anasarca/swelling of the left lower extremity.  There was no evidence of metastatic disease in the chest. 09/13/2017:  Head MRI revealed no evidence of metastatic disease. 11/17/2017:  Right foot MRI revealed artifact in the plantar soft tissues over the first metatarsal head likely  representing metallic foreign bodies as seen on previous radiographs.  There was an area of increased T2 signal intensity in the plantar aspect of the first metatarsal head with focal enhancement may indicate an area of focal osteomyelitis.  There was no soft tissue abscess. 12/07/2017:  Abdomen and pelvic CTrevealed no new or progressive metastatic disease.There was no acute vascular abnormality. IVC filter and left iliac venous stent graft werein place. There was persistent postsurgical fat stranding throughout the leftinguinal region without fluid collection 12/08/2017:  Bilateral lower extremity duplex revealed new nonocclusive thrombus in the left common femoralveinalong the anterior wall measuring 3-5 mm in thickness. This wasnot flow limiting but is new since the prior study on 11/05/2017 and consistent with acute/subacute thrombus (old per Dr. Delana Meyer). There was no evidence of right lower extremity deep vein thrombosis   Admissions: He was admitted to [facility] from 10/08/2017 - 10/16/2017 with left lower extremity cellulitis.  He was treated with Cefepime and vancomycin.  Imaging revealed significant improvement in adenopathy with decreased pelvic sidewall disease and decreased bulk at aortic bifurcation.  He was felt to have venous congestion syndrome with skin lesions due to congestion.   He was admitted to [facility] from 10/21/2017 - 10/26/2017 for inpatient chemotherapy (cycle #2 TIP).    He was admitted to [facility] from 11/05/2017 - 11/09/2017 for cellulitis.  He was developing similar lower extremity skin lesions following his first cycle of chemotherapy.  Skin biopsy revealed diffuse neutrophilic dermatitis. Cultures were negative.  Xarelto was held secondary to thrombocytopenia (nadir platelet count 39,000).  He was treated with heparin.  Lower extremity duplex revealed no clot.  He was treated with Cefepime and vancomycin.  He was discharged on Augmentin.  He received oral acyclovir for  herpetic labialis.  He was admitted to [facility] from 11/22/2017 - 11/27/2017 for inpatient chemotherapy (cycle #3 TIP).    He was admitted to [facility]  from 12/07/2017 - 12/11/2017 with fever and lower extremity edema.  He developed recurrent leg lesions. 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  He fevers quickly resolved.  He received 1 unit of PRBCs.  Platelets became > 50,000 on 12/11/2017 and Xarelto was restarted.  He was admitted to [facility] from 12/27/2017 - 01/01/2018 for inpatient chemotherapy (cycle #4 TIP).  He was admitted to [facility] from 01/11/2018 - 01/13/2018 for pancytopenia. Platelet count was 6,000. He received 1 unit of platelets. Hemoglobin dropped to 7.3 on 01/12/2018. He received 1 units of PRBCs. He was discharged on 01/13/2018 with a hematocrit of 25.7. Hemoglobin 8.8, platelets 24,000, and WBC 7300.     Appointment on 01/14/2018  Component Date Value Ref Range Status  . Magnesium 01/14/2018 1.9  1.7 - 2.4 mg/dL Final   Performed at [facility], [address] [place], [place] [address]  . Sodium 01/14/2018 140  135 - 145 mmol/L Final  . Potassium 01/14/2018 3.9  3.5 - 5.1 mmol/L Final  . Chloride 01/14/2018 99  98 - 111 mmol/L Final  . CO2 01/14/2018 34* 22 - 32 mmol/L Final  . Glucose, Bld 01/14/2018 75  70 - 99 mg/dL Final  . BUN 01/14/2018 21* 6 - 20 mg/dL Final  . Creatinine, Ser 01/14/2018 0.99  0.61 - 1.24 mg/dL Final  . Calcium 01/14/2018 9.0  8.9 - 10.3 mg/dL Final  . Total Protein 01/14/2018 6.5  6.5 - 8.1 g/dL Final  . Albumin 01/14/2018 3.8  3.5 - 5.0 g/dL Final  . AST 01/14/2018 13* 15 - 41 U/L Final  . ALT 01/14/2018 14  0 - 44 U/L Final  . Alkaline Phosphatase 01/14/2018 54  38 - 126 U/L Final  . Total Bilirubin 01/14/2018 0.2* 0.3 - 1.2 mg/dL Final  . GFR calc non Af Amer 01/14/2018 >60  >60 mL/min Final  . GFR calc Af Amer  01/14/2018 >60  >60 mL/min Final   Comment: (NOTE) The eGFR has been calculated using the CKD EPI equation. This calculation has not been validated in all clinical situations. eGFR's persistently <60 mL/min signify possible Chronic Kidney Disease.   Georgiann Hahn gap 01/14/2018 7  5 - 15 Final   Performed at [facility], [address] [place], [place] [address]  . WBC 01/14/2018 9.1  4.0 - 10.5 K/uL Final  . RBC 01/14/2018 2.90* 4.22 - 5.81 MIL/uL Final  . Hemoglobin 01/14/2018 9.1* 13.0 - 17.0 g/dL Final  . HCT 01/14/2018 27.7* 39.0 - 52.0 % Final  . MCV 01/14/2018 95.5  80.0 - 100.0 fL Final  . MCH 01/14/2018 31.4  26.0 - 34.0 pg Final  . MCHC 01/14/2018 32.9  30.0 - 36.0 g/dL Final  . RDW 01/14/2018 15.7* 11.5 - 15.5 % Final  . Platelets 01/14/2018 38* 150 - 400 K/uL Final   Comment: REPEATED TO VERIFY SPECIMEN CHECKED FOR CLOTS Immature Platelet Fraction may be clinically indicated, consider ordering this additional test [id number]   . nRBC 01/14/2018 0.4* 0.0 - 0.2 % Final   Performed at [facility], [address] [place], [place] [address]  . Neutrophils Relative % 01/14/2018 PENDING  % Incomplete  . Neutro Abs 01/14/2018 PENDING  1.7 - 7.7 K/uL Incomplete  . Band Neutrophils 01/14/2018 PENDING  % Incomplete  . Lymphocytes Relative 01/14/2018 PENDING  % Incomplete  . Lymphs Abs 01/14/2018 PENDING  0.7 - 4.0 K/uL Incomplete  . Monocytes Relative 01/14/2018 PENDING  % Incomplete  . Monocytes Absolute 01/14/2018 PENDING  0.1 - 1.0 K/uL Incomplete  . Eosinophils Relative 01/14/2018 PENDING  % Incomplete  . Eosinophils Absolute 01/14/2018 PENDING  0.0 - 0.5 K/uL Incomplete  . Basophils Relative 01/14/2018 PENDING  % Incomplete  . Basophils Absolute 01/14/2018 PENDING  0.0 - 0.1 K/uL Incomplete  . WBC Morphology 01/14/2018 PENDING   Incomplete  . RBC Morphology 01/14/2018 PENDING   Incomplete  . Smear Review 01/14/2018 PENDING   Incomplete  . Other 01/14/2018  PENDING  % Incomplete  . nRBC 01/14/2018 PENDING  0 /100 WBC Incomplete  . Metamyelocytes Relative 01/14/2018 PENDING  % Incomplete  . Myelocytes 01/14/2018 PENDING  % Incomplete  . Promyelocytes Relative 01/14/2018 PENDING  % Incomplete  . Blasts 01/14/2018 PENDING  % Incomplete  Admission on 01/11/2018, Discharged on 01/13/2018  Component Date Value Ref Range Status  . Sodium 01/11/2018 138  135 - 145 mmol/L Final  . Potassium 01/11/2018 3.5  3.5 - 5.1 mmol/L Final  . Chloride 01/11/2018 104  98 - 111 mmol/L Final  . CO2 01/11/2018 28  22 - 32 mmol/L Final  . Glucose, Bld 01/11/2018 108* 70 - 99 mg/dL Final  . BUN 01/11/2018 21* 6 - 20 mg/dL Final  . Creatinine, Ser 01/11/2018 0.76  0.61 - 1.24 mg/dL Final  . Calcium 01/11/2018 8.7* 8.9 - 10.3 mg/dL Final  . GFR calc non Af Amer 01/11/2018 >60  >60 mL/min Final  . GFR calc Af Amer 01/11/2018 >60  >60 mL/min Final   Comment: (NOTE) The eGFR has been calculated using the CKD EPI equation. This calculation has not been validated in all clinical situations. eGFR's persistently <60 mL/min signify possible Chronic Kidney Disease.   Georgiann Hahn gap 01/11/2018 6  5 - 15 Final   Performed at [facility], [address] [place], [place] [address]  . ABO/RH(D) 01/11/2018 A POS   Final  . Antibody Screen 01/11/2018 NEG   Final  . Sample Expiration 01/11/2018 01/14/2018   Final  . Unit Number 01/11/2018 [id number]   Final  . Blood Component Type 01/11/2018 RBC, LR IRR   Final  . Unit division 01/11/2018 00   Final  . Status of Unit 01/11/2018 ISSUED,FINAL   Final  . Transfusion Status 01/11/2018 OK TO TRANSFUSE   Final  . Crossmatch Result 01/11/2018    Final                   Value:Compatible Performed at [facility], [address] [place], [place] [address]   . Unit Number 01/11/2018 [id number]   Final  . Blood Component Type 01/11/2018 PLTPH LI3 PAS   Final  . Unit division 01/11/2018 00   Final  . Status  of Unit 01/11/2018 ISSUED,FINAL   Final  . Transfusion Status 01/11/2018    Final                   Value:OK TO TRANSFUSE Performed at [facility], [address], [place], [place] [address]   . WBC 01/12/2018 3.5* 4.0 - 10.5 K/uL Final  . RBC 01/12/2018 2.31* 4.22 - 5.81 MIL/uL Final  . Hemoglobin 01/12/2018 7.3* 13.0 - 17.0 g/dL Final  . HCT 01/12/2018 22.9* 39.0 - 52.0 % Final  . MCV 01/12/2018 99.1  80.0 - 100.0 fL Final  . MCH 01/12/2018 31.6  26.0 - 34.0 pg Final  .  MCHC 01/12/2018 31.9  30.0 - 36.0 g/dL Final  . RDW 01/12/2018 14.6  11.5 - 15.5 % Final  . Platelets 01/12/2018 7* 150 - 400 K/uL Final   Comment: Immature Platelet Fraction may be clinically indicated, consider ordering this additional test [id number] THIS CRITICAL RESULT HAS VERIFIED AND BEEN CALLED TO APRIL DEANGELO BY QUANISHA DYSON ON 10 30 2019 AT 0523, AND HAS BEEN READ BACK.    Marland Kitchen nRBC 01/12/2018 0.6* 0.0 - 0.2 % Final   Performed at [facility], [address] [place], [place] [address]  . ISSUE DATE / TIME 01/11/2018 [id number]   Final  . Blood Product Unit Number 01/11/2018 [id number]   Final  . PRODUCT CODE 01/11/2018 [id number]   Final  . Unit Type and Rh 01/11/2018 6200   Final  . Blood Product Expiration Date 01/11/2018 [id number]   Final  . Platelets 01/12/2018 25* 150 - 400 K/uL Final   Comment: Immature Platelet Fraction may be clinically indicated, consider ordering this additional test [id number] CRITICAL VALUE NOTED.  VALUE IS CONSISTENT WITH PREVIOUSLY REPORTED AND CALLED VALUE. Performed at [facility], [address], [place], [place] [address]   . Immature Platelet Fraction 01/12/2018 3.5  1.2 - 8.6 % Final   Performed at [facility], [address] [place], [place] [address]  . Retic Ct Pct 01/12/2018 0.3* 0.4 - 3.1 % Final  . RBC. 01/12/2018 2.31* 4.22 - 5.81 MIL/uL Final  . Retic Count, Absolute 01/12/2018 6.7* 19.0 - 186.0 K/uL Final    . Immature Retic Fract 01/12/2018 7.9  2.3 - 15.9 % Final  . Reticulocyte Hemoglobin 01/12/2018 35.4  >27.9 pg Final   Comment:        Given the high negative predictive value of a RET-He result > 32 pg iron deficiency is essentially excluded. If this patient is anemic other etiologies should be considered. Performed at [facility], [address], [place], [place] [address]   . Order Confirmation 01/12/2018    Final                   Value:ORDER PROCESSED BY BLOOD BANK Performed at [facility], [address] [place], [place] [address]   . ISSUE DATE / TIME 01/11/2018 [id number]   Final  . Blood Product Unit Number 01/11/2018 [id number]   Final  . PRODUCT CODE 01/11/2018 [id number]   Final  . Unit Type and Rh 01/11/2018 6200   Final  . Blood Product Expiration Date 01/11/2018 [id number]   Final  . WBC 01/13/2018 7.3  4.0 - 10.5 K/uL Final  . RBC 01/13/2018 2.75* 4.22 - 5.81 MIL/uL Final  . Hemoglobin 01/13/2018 8.8* 13.0 - 17.0 g/dL Final  . HCT 01/13/2018 25.7* 39.0 - 52.0 % Final  . MCV 01/13/2018 93.5  80.0 - 100.0 fL Final  . MCH 01/13/2018 32.0  26.0 - 34.0 pg Final  . MCHC 01/13/2018 34.2  30.0 - 36.0 g/dL Final  . RDW 01/13/2018 15.7* 11.5 - 15.5 % Final  . Platelets 01/13/2018 24* 150 - 400 K/uL Final   Comment: Immature Platelet Fraction may be clinically indicated, consider ordering this additional test [id number] CRITICAL VALUE NOTED.  VALUE IS CONSISTENT WITH PREVIOUSLY REPORTED AND CALLED VALUE.   Marland Kitchen nRBC 01/13/2018 0.5* 0.0 - 0.2 % Final   Performed at [facility], [place] [place],  [address]  . Hemoglobin 01/12/2018 8.5* 13.0 - 17.0 g/dL Final   Performed at [facility], [address] [place], [place] [address]    Assessment:  Edgar [name] is a 27 y.o. male with recurrent testicular cancer.  He presented with a left lower extremity DVT on 09/10/2017.  He initially presented with stage IIB  left testicular cancer s/p left radical orchiectomy on 11/26/2015.  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  Clinical/pathologic stage is T1 N1-2 S0 M0.  He received 3 cycles of  BEP (12/30/2015 - 02/24/2016).  He received OnPro Neulasta with cycle #2 and #3.  He declined evaluation for retroperitoneal lymph node dissection (RPLND).  He declined sperm banking.  Pre surgery labs on 11/20/2015 revealed an AFP of 411.9, beta-HCG 2,669, and LDH 522 (121-224).    Tumor markers have been followed:   AFP was 411.9 (0-8.3) on 11/20/2015, 11.2 on 12/19/2015, 6.3 on 12/23/2015, 1.3 on 01/27/2016, 1.8 on 02/17/2016, 1.9 on 03/30/2016, 1.4 on 05/27/2016, 1.0 on 07/29/2016, 1.4 on 10/14/2016, 3.3 on 12/24/2016, 1 on 09/10/2017, 1 on 09/20/2017,  2.6 on 10/21/2017, 2.1 on 11/16/2017, and 2.1 on 12/20/2017.    Beta-HCG was 2,669 (0-3) on 11/20/2015, 2 on 12/19/2015, 1 on 12/23/2015, < 1 on 01/27/2016, < 1 on 02/17/2016, < 1 on 03/30/2016, < 1 on 05/27/2016,  < 1 on 07/29/2016, < 1 on 10/14/2016, and 3 on 12/24/2016, < 5 on 09/11/2017,  < 5 on 09/20/2017, < 1 on 10/21/2017, < 1 on 11/16/2017, and < 1 on 12/20/2017.  LDH was 522 (98-192) on 11/20/2015, 179 on 12/19/2015, 160 on 12/23/2015, 202 on 01/27/2016, 156 on 02/10/2016, 161 on 02/17/2016, 172 on 03/30/2016, 178 on 05/27/2016, 150 on 07/29/2016, 152 on 10/14/2016, 145 on 12/24/2016, 1557 on 09/11/2017, 916 on 09/20/2017, 144 on 10/21/2017, 158 on 11/16/2017, 203 on 12/20/2017, and 151 on 12/27/2017.  Chest, abdomen, and pelvic CT on 09/11/2017 revealed enlarged and morphologically abnormal 4.3 x 4.9 cm left pelvic sidewall and retroperitoneal lymph nodes (measuring up to 2.9 cm).  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  There was anasarca/swelling of the left lower extremity.   There was no evidence of metastatic disease in the chest.  Head MRI on 09/13/2017 revealed no evidence of metastatic disease.   He has a left lower extremity DVT.  Bilateral lower extremity duplex on 09/10/2017 revealed evidence of obstruction in the left CFV, SFJ, and proximal femoral vein.  He is on Xarelto.  He received 4 cycles of TIP chemotherapy (09/21/2017 - 12/27/2017). Cycle #1 was administered at [facility]. Cycles #2 and 3 were administered at [facility].  Both cycles 1 and 2 were complicated by lower extremity skin lesions on recovery of counts. 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 He is on prednisone 20 mg daily. Patient received daily Zarxio injections from 01/03/2018 - 01/12/2018.  Audiogram on 10/27/2017 revealed hearing within normal limits.  He underwent IVC filterplacement, percutaneous angioplastyand stent placementof the left common iliac vein and left external iliac vein on 10/13/2017.  He was discharged on Flagyl, doxycycline, and Levaquin.  Hypercoagulable work-up on 10/12/2017 negative for the following: Factor V Leiden, prothrombin gene mutation, lupus anticoagulant, anticardiolipin antibodies, beta-2 glycoprotein, protein C activity/antigen, protein S activity/antigen. ATIII was 54% (75-120) on heparin.  He has a history of right lower molar abscess s/p extraction on 11/19/2017.  He was treated with amoxicillin.  He stepped on  a nail with his right foot.  He was treated with antibiotics for a puncture wound.  He has been followed by podiatry.  Symptomatically, patient is recovering slowly from his last cycle of chemotherapy. He notes that it is taking longer this time, which he attributes to the daily Zarxio injections. No nausea, vomiting, or diarrhea. No B symptoms. Eating well; weight up 8 pounds. Pain well controlled on currently prescribed regimen. LEFT lower extremity wound continues to improve with twice daily wound care and  chronic steroids.  WBC 9100 ([place] 6900).  Hemoglobin 9.1, hematocrit 27.7, and platelets 38,000.   Plan: 1. Labs today:  CBC with diff, CMP, Mg. 2. Discuss interval hospitalization for chemotherapy induced pancytopenia.  3. Recurrent testicular cancer  Doing well following cycle #4 TIP chemotherapy.  Discuss post treatment imaging to assess chemotherapy response.  PET scan ordered.  Reviewed plans for possible RPLD based on residual disease noted on imaging. 4. Chemotherapy-induced pancytopenia  Labs reviewed.  Hemoglobin 9.1, hematocrit 27.7, and platelets 38,000.  Received 1 unit of platelets for a platelet count of 6000 and 1 unit of PRBCs for a hemoglobin of 7.3 g/dL during recent admission.  WBC 9100 ([place] 6900).  Received daily Zarxio injections from 01/03/2018-01/12/2018. 5. Sweet's syndrome  LEFT lower extremity wound continues to improve with twice daily wound care.    Continues on chronic steroid therapy (prednisone 20 mg day).  Jointly managed by [facility] dermatology Marolyn Hammock, MD). 6. LEFT lower extremity DVT  Anticoagulation (rivaroxaban) on hold due to platelet count < 50,000.  Denies increased pain or swelling in his lower extremities.  Anticipate reinitiation of rivaroxaban therapy once platelet count returns to normal. 7. Electrolyte wasting  Labs reviewed.  All electrolytes and renal function normal.  Continue magnesium and potassium supplementation daily as previously prescribed.  Continue routine lab monitoring. 8. Chemotherapy-induced neuropathy  Patient has stable neuropathy in his hands and feet.   He notes that his neuropathy does not impose any functional limitations, nor does it impede his ability to ambulate safely.  Continue routine monitoring for symptom progression 9. Pain and symptom management Patient has antiemetics and pain medications at home to use on a PRN basis. Patient  advising that the  prescribed interventions are adequate at this  point.  Patient continues to have significant anxiety following his treatment.  He is using clonazepam 0.5 mg twice daily PRN, which is noted be effective. Medication safety factors reviewed with patient due to concurrent BZO and opioid therapy Patient educated that medications should not be bitten, chewed, or crushed.  Patient verbalized understanding that medications should not be sold or shared, taken with alcohol, or used while driving. He has been made aware of the side effects of using this medication. Patient understands that this medication can cause CNS depression, increase his risk of falls, and even lead to overdose that may result in death, if used outside of the discussed parameters  With all of this in mind, he accepts the risks and responsibilities associated with therapy and elects to continue to use the prescribed interventions.  Continue all medications as previously prescribed.  10. RTC on 01/17/2018 for labs (CBC with diff, BMP)  11. RTC after PET scan for MD assessment and labs (CBC with diff, BMP)   Honor Loh, NP  01/14/2018, 11:10 AM   I saw and evaluated the patient, participating in the key portions of the service and reviewing pertinent diagnostic studies and records.  I reviewed the nurse practitioner's note and  agree with the findings and the plan.  The assessment and plan were discussed with the patient.  Multiple questions were asked by the patient and answered.   Nolon Stalls, MD 01/14/2018,11:10 AM

## 2018-01-14 NOTE — Progress Notes (Signed)
Patient c/o right sided back pain.  Patient states his legs feel swollen but they are not as bad as before.  He says they hurt a lot at night.  Feet sting when he walks on anything other than carpet.

## 2018-01-15 ENCOUNTER — Ambulatory Visit: Payer: BLUE CROSS/BLUE SHIELD

## 2018-01-16 ENCOUNTER — Ambulatory Visit: Payer: BLUE CROSS/BLUE SHIELD

## 2018-01-17 ENCOUNTER — Telehealth: Payer: Self-pay | Admitting: *Deleted

## 2018-01-17 ENCOUNTER — Ambulatory Visit: Payer: BLUE CROSS/BLUE SHIELD

## 2018-01-17 ENCOUNTER — Inpatient Hospital Stay: Payer: BLUE CROSS/BLUE SHIELD

## 2018-01-17 DIAGNOSIS — Z9221 Personal history of antineoplastic chemotherapy: Secondary | ICD-10-CM | POA: Diagnosis not present

## 2018-01-17 DIAGNOSIS — Z79899 Other long term (current) drug therapy: Secondary | ICD-10-CM | POA: Diagnosis not present

## 2018-01-17 DIAGNOSIS — T451X5A Adverse effect of antineoplastic and immunosuppressive drugs, initial encounter: Secondary | ICD-10-CM | POA: Diagnosis not present

## 2018-01-17 DIAGNOSIS — Z9079 Acquired absence of other genital organ(s): Secondary | ICD-10-CM | POA: Diagnosis not present

## 2018-01-17 DIAGNOSIS — C6212 Malignant neoplasm of descended left testis: Secondary | ICD-10-CM

## 2018-01-17 DIAGNOSIS — L982 Febrile neutrophilic dermatosis [Sweet]: Secondary | ICD-10-CM | POA: Diagnosis not present

## 2018-01-17 DIAGNOSIS — Z7901 Long term (current) use of anticoagulants: Secondary | ICD-10-CM | POA: Diagnosis not present

## 2018-01-17 DIAGNOSIS — I82412 Acute embolism and thrombosis of left femoral vein: Secondary | ICD-10-CM | POA: Diagnosis not present

## 2018-01-17 DIAGNOSIS — C6292 Malignant neoplasm of left testis, unspecified whether descended or undescended: Secondary | ICD-10-CM | POA: Diagnosis not present

## 2018-01-17 DIAGNOSIS — F064 Anxiety disorder due to known physiological condition: Secondary | ICD-10-CM | POA: Diagnosis not present

## 2018-01-17 DIAGNOSIS — Z7952 Long term (current) use of systemic steroids: Secondary | ICD-10-CM | POA: Diagnosis not present

## 2018-01-17 DIAGNOSIS — G620 Drug-induced polyneuropathy: Secondary | ICD-10-CM | POA: Diagnosis not present

## 2018-01-17 DIAGNOSIS — F1290 Cannabis use, unspecified, uncomplicated: Secondary | ICD-10-CM | POA: Diagnosis not present

## 2018-01-17 DIAGNOSIS — Z86718 Personal history of other venous thrombosis and embolism: Secondary | ICD-10-CM | POA: Diagnosis not present

## 2018-01-17 DIAGNOSIS — F17210 Nicotine dependence, cigarettes, uncomplicated: Secondary | ICD-10-CM | POA: Diagnosis not present

## 2018-01-17 LAB — CBC WITH DIFFERENTIAL/PLATELET
Band Neutrophils: 9 %
Basophils Absolute: 0 10*3/uL (ref 0.0–0.1)
Basophils Relative: 0 %
Eosinophils Absolute: 0 10*3/uL (ref 0.0–0.5)
Eosinophils Relative: 0 %
HCT: 28.4 % — ABNORMAL LOW (ref 39.0–52.0)
Hemoglobin: 9.2 g/dL — ABNORMAL LOW (ref 13.0–17.0)
Lymphocytes Relative: 17 %
Lymphs Abs: 1.2 10*3/uL (ref 0.7–4.0)
MCH: 31.7 pg (ref 26.0–34.0)
MCHC: 32.4 g/dL (ref 30.0–36.0)
MCV: 97.9 fL (ref 80.0–100.0)
Metamyelocytes Relative: 5 %
Monocytes Absolute: 0.6 10*3/uL (ref 0.1–1.0)
Monocytes Relative: 8 %
Myelocytes: 6 %
Neutro Abs: 5.5 10*3/uL (ref 1.7–7.7)
Neutrophils Relative %: 55 %
Platelets: 85 10*3/uL — ABNORMAL LOW (ref 150–400)
RBC: 2.9 MIL/uL — ABNORMAL LOW (ref 4.22–5.81)
RDW: 15.1 % (ref 11.5–15.5)
Smear Review: DECREASED
WBC: 7.3 10*3/uL (ref 4.0–10.5)
nRBC: 1.1 % — ABNORMAL HIGH (ref 0.0–0.2)

## 2018-01-17 LAB — BASIC METABOLIC PANEL
Anion gap: 8 (ref 5–15)
BUN: 11 mg/dL (ref 6–20)
CO2: 30 mmol/L (ref 22–32)
Calcium: 9.1 mg/dL (ref 8.9–10.3)
Chloride: 100 mmol/L (ref 98–111)
Creatinine, Ser: 0.76 mg/dL (ref 0.61–1.24)
GFR calc Af Amer: >60 mL/min (ref >60)
GFR calc non Af Amer: >60 mL/min (ref >60)
Glucose, Bld: 95 mg/dL (ref 70–99)
Potassium: 3.8 mmol/L (ref 3.5–5.1)
Sodium: 138 mmol/L (ref 135–145)

## 2018-01-17 MED ORDER — HEPARIN SOD (PORK) LOCK FLUSH 100 UNIT/ML IV SOLN
500.0000 [IU] | Freq: Once | INTRAVENOUS | Status: AC
  Administered 2018-01-17: 500 [IU] via INTRAVENOUS

## 2018-01-17 MED ORDER — SODIUM CHLORIDE 0.9% FLUSH
10.0000 mL | INTRAVENOUS | Status: DC | PRN
  Administered 2018-01-17: 10 mL via INTRAVENOUS
  Filled 2018-01-17: qty 10

## 2018-01-17 NOTE — Telephone Encounter (Signed)
Called patient and LVM to inform him that MD wants him to go back on Xarelto this evening.

## 2018-01-18 ENCOUNTER — Telehealth: Payer: Self-pay | Admitting: *Deleted

## 2018-01-18 NOTE — Telephone Encounter (Signed)
Per Velna Hatchet, to cancel PET scheduled for 01/20/18   PET Scan has been cancelled as requested.  I tried to call patient to make him aware.... However, I was unable to reach him. A detailed message was left on patient's vmail to make him aware.

## 2018-01-19 DIAGNOSIS — L88 Pyoderma gangrenosum: Secondary | ICD-10-CM | POA: Diagnosis not present

## 2018-01-20 ENCOUNTER — Encounter: Payer: Self-pay | Admitting: Family Medicine

## 2018-01-20 ENCOUNTER — Ambulatory Visit (INDEPENDENT_AMBULATORY_CARE_PROVIDER_SITE_OTHER): Payer: BLUE CROSS/BLUE SHIELD | Admitting: Family Medicine

## 2018-01-20 ENCOUNTER — Ambulatory Visit: Payer: BLUE CROSS/BLUE SHIELD

## 2018-01-20 VITALS — BP 120/83 | HR 123 | Temp 98.5°F | Ht 74.5 in | Wt 260.8 lb

## 2018-01-20 DIAGNOSIS — Z13220 Encounter for screening for lipoid disorders: Secondary | ICD-10-CM

## 2018-01-20 DIAGNOSIS — F321 Major depressive disorder, single episode, moderate: Secondary | ICD-10-CM | POA: Diagnosis not present

## 2018-01-20 DIAGNOSIS — C6212 Malignant neoplasm of descended left testis: Secondary | ICD-10-CM

## 2018-01-20 NOTE — Progress Notes (Signed)
BP 120/83 (BP Location: Left Arm, Patient Position: Sitting, Cuff Size: Normal)   Pulse (!) 123   Temp 98.5 F (36.9 C) (Oral)   Ht 6' 2.5" (1.892 m)   Wt 260 lb 12.8 oz (118.3 kg)   SpO2 95%   BMI 33.04 kg/m    Subjective:    Patient ID: Edgar [name], male    DOB: October 02, 1990, 27 y.o.   MRN: [id number]  HPI: [name] is a 27 y.o. male who presents today after being lost to follow up for 14 months  Chief Complaint  Patient presents with  . testicular cancer   Since his last visit, he was diagnosed with recurrent testicular cancer. He just finished his chemotherapy.  DEPRESSION- states that he has not been taking his medicine for his depression, states that he feels mainly anxious. Was started on clonazepam due to his anxiety and thinks that it has been helping. Didn't like the way the preventative medicines made him feel.  Mood status: uncontrolled Satisfied with current treatment?: stable Symptom severity: moderate  Duration of current treatment : not on anything Side effects: no Medication compliance: poor compliance Psychotherapy/counseling: no  Previous psychiatric medications: cymbalta, sertraline, buspar, ativan, clonazepam Depressed mood: yes Anxious mood: yes Anhedonia: no Significant weight loss or gain: no Insomnia: yes hard to fall asleep Fatigue: yes Feelings of worthlessness or guilt: yes Impaired concentration/indecisiveness: yes Suicidal ideations: no Hopelessness: no Crying spells: yes Depression screen [facility] 2/9 01/20/2018 11/20/2016  Decreased Interest 1 0  Down, Depressed, Hopeless 1 2  PHQ - 2 Score 2 2  Altered sleeping 2 3  Tired, decreased energy 2 3  Change in appetite 1 2  Feeling bad or failure about yourself  1 1  Trouble concentrating 0 2  Moving slowly or fidgety/restless 1 3  Suicidal thoughts 0 0  PHQ-9 Score 9 16  Difficult doing work/chores Very difficult -   HOSPITAL FOLLOW UP Time since discharge: [facility]    Hospital/facility: 7 days Diagnosis: Anemia/Thrombocytopenia Procedures/tests: Transfusion Consultants: Oncology New medications: None Discharge instructions: Follow up with oncology  Status: stable   Active Ambulatory Problems    Diagnosis Date Noted  . Testicular cancer ([place]) 11/26/2015  . Encounter for antineoplastic chemotherapy 01/19/2016  . Current moderate episode of major depressive disorder without prior episode ([place]) 11/20/2016  . Cellulitis 10/08/2017  . Acute deep vein thrombosis (DVT) of proximal vein of left lower extremity ([place]) 10/09/2017  . Diarrhea 10/09/2017  . Goals of care, counseling/discussion 10/09/2017  . Anemia   . DVT (deep venous thrombosis) ([place])   . History of testicular cancer   . Hypomagnesemia 11/05/2017  . Thrombocytopenia ([place]) 11/06/2017  . Lower extremity edema 11/06/2017  . Antineoplastic chemotherapy induced anemia   . Chronic anticoagulation   . History of DVT (deep vein thrombosis)   . Leg ulcer, left ([place]) 11/12/2017  . Neutrophilic dermatosis 24/26/8341  . Hypokalemia 11/21/2017  . Herpes labialis 11/26/2017  . Fever 12/08/2017  . Sweet's syndrome    Resolved Ambulatory Problems    Diagnosis Date Noted  . Pain   . Sepsis ([place]) 11/05/2017  . Dental abscess 11/18/2017  . Puncture wound of right foot with foreign body 11/21/2017   Past Medical History:  Diagnosis Date  . Asthma   . Cancer ([place])   . GERD (gastroesophageal reflux disease)    Past Surgical History:  Procedure Laterality Date  . ANKLE FRACTURE SURGERY Right 2004  . FINE NEEDLE ASPIRATION BIOPSY N/A 11/09/2017  Procedure: FINE NEEDLE ASPIRATION BIOPSY;  Surgeon: [name], MD;  Location: [place] [facility];  Service: Cardiovascular;  Laterality: N/A;  . IVC FILTER INSERTION    . ORCHIECTOMY Left 11/26/2015   Procedure: ORCHIECTOMY;  Surgeon: [name], MD;  Location: [facility] ORS;  Service: Urology;  Laterality: Left;  radical/Inguinal approach  .  PERIPHERAL VASCULAR CATHETERIZATION N/A 12/16/2015   Procedure: [name] Cath Insertion;  Surgeon: [name], MD;  Location: [place] [facility];  Service: Cardiovascular;  Laterality: N/A;  . PERIPHERAL VASCULAR THROMBECTOMY Left 10/13/2017   Procedure: PERIPHERAL VASCULAR THROMBECTOMY;  Surgeon: [name], MD;  Location: [place] [facility];  Service: Cardiovascular;  Laterality: Left;  . WISDOM TOOTH EXTRACTION  2017   Outpatient Encounter Medications as of 01/20/2018  Medication Sig  . acyclovir (ZOVIRAX) 400 MG tablet Take 400 mg by mouth 2 (two) times daily.  . clobetasol ointment (TEMOVATE) 9.98 % Apply 1 application topically 2 (two) times daily as needed (bandage changes).   . clonazePAM (KLONOPIN) 0.5 MG disintegrating tablet Take 1 tablet (0.5 mg total) by mouth 2 (two) times daily as needed (anxiety).  . gabapentin (NEURONTIN) 300 MG capsule Take 300 mg by mouth at bedtime.  Marland Kitchen loratadine (CLARITIN) 10 MG tablet Take 1 tablet (10 mg total) by mouth daily.  . magnesium oxide (MAG-OX) 400 MG tablet Take 400 mg by mouth daily.  Marland Kitchen omeprazole (PRILOSEC) 20 MG capsule Take 1 capsule (20 mg total) by mouth daily.  . Oxycodone HCl 10 MG TABS Take 1 tablet (10 mg total) by mouth every 8 (eight) hours as needed.  . potassium chloride (K-DUR) 10 MEQ tablet Take 10 mEq by mouth 2 (two) times daily.  . predniSONE (DELTASONE) 20 MG tablet Take 1 tablet (20 mg total) by mouth daily.  Alveda Reasons 20 MG TABS tablet Take 20 mg by mouth daily.   Facility-Administered Encounter Medications as of 01/20/2018  Medication  . filgrastim-sndz (ZARXIO) injection 480 mcg  . filgrastim-sndz (ZARXIO) injection 480 mcg   No Known Allergies  Social History   Socioeconomic History  . Marital status: Single    Spouse name: Not on file  . Number of children: Not on file  . Years of education: Not on file  . Highest education level: Not on file  Occupational History  . Not on file  Social Needs  .  Financial resource strain: Patient refused  . Food insecurity:    Worry: Patient refused    Inability: Patient refused  . Transportation needs:    Medical: Patient refused    Non-medical: Patient refused  Tobacco Use  . Smoking status: Current Every Day Smoker    Packs/day: 0.50    Years: 8.00    Pack years: 4.00    Types: Cigarettes  . Smokeless tobacco: Former Systems developer    Types: Snuff  Substance and Sexual Activity  . Alcohol use: No  . Drug use: Not Currently    Types: Marijuana  . Sexual activity: Yes  Lifestyle  . Physical activity:    Days per week: Patient refused    Minutes per session: Patient refused  . Stress: To some extent  Relationships  . Social connections:    Talks on phone: Twice a week    Gets together: Once a week    Attends religious service: More than 4 times per year    Active member of club or organization: Yes    Attends meetings of clubs or organizations: Not  on file    Relationship status: Never married  Other Topics Concern  . Not on file  Social History Narrative  . Not on file   Family History  Problem Relation Age of Onset  . Skin cancer Mother   . Breast cancer Maternal Grandmother   . Colon cancer Maternal Grandfather   . Diabetes Maternal Grandfather   . Emphysema Father   . Mental illness Sister        anxiety  . Stroke Paternal Grandmother   . Prostate cancer Neg Hx   . Kidney cancer Neg Hx   . Bladder Cancer Neg Hx    Review of Systems  Constitutional: Negative.   Respiratory: Negative.   Cardiovascular: Negative.   Skin: Negative.   Psychiatric/Behavioral: Positive for decreased concentration, dysphoric mood and sleep disturbance. Negative for agitation, behavioral problems, confusion, hallucinations, self-injury and suicidal ideas. The patient is nervous/anxious. The patient is not hyperactive.     Per HPI unless specifically indicated above     Objective:    BP 120/83 (BP Location: Left Arm, Patient Position:  Sitting, Cuff Size: Normal)   Pulse (!) 123   Temp 98.5 F (36.9 C) (Oral)   Ht 6' 2.5" (1.892 m)   Wt 260 lb 12.8 oz (118.3 kg)   SpO2 95%   BMI 33.04 kg/m   Wt Readings from Last 3 Encounters:  01/20/18 260 lb 12.8 oz (118.3 kg)  01/14/18 263 lb (119.3 kg)  01/12/18 275 lb (124.7 kg)    Physical Exam  Constitutional: He is oriented to person, place, and time. He appears well-developed and well-nourished. No distress.  HENT:  Head: Normocephalic and atraumatic.  Right Ear: Hearing normal.  Left Ear: Hearing normal.  Nose: Nose normal.  Eyes: Conjunctivae and lids are normal. Right eye exhibits no discharge. Left eye exhibits no discharge. No scleral icterus.  Cardiovascular: Normal rate, regular rhythm, normal heart sounds and intact distal pulses. Exam reveals no gallop and no friction rub.  No murmur heard. Pulmonary/Chest: Effort normal and breath sounds normal. No stridor. No respiratory distress. He has no wheezes. He has no rales. He exhibits no tenderness.  Musculoskeletal: Normal range of motion.  Neurological: He is alert and oriented to person, place, and time.  Skin: Skin is warm, dry and intact. Capillary refill takes less than 2 seconds. No rash noted. He is not diaphoretic. No erythema. No pallor.  Psychiatric: He has a normal mood and affect. His speech is normal and behavior is normal. Judgment and thought content normal. Cognition and memory are normal.  Nursing note and vitals reviewed.   Results for orders placed or performed in visit on 29/52/84  Basic metabolic panel  Result Value Ref Range   Sodium 138 135 - 145 mmol/L   Potassium 3.8 3.5 - 5.1 mmol/L   Chloride 100 98 - 111 mmol/L   CO2 30 22 - 32 mmol/L   Glucose, Bld 95 70 - 99 mg/dL   BUN 11 6 - 20 mg/dL   Creatinine, Ser 0.76 0.61 - 1.24 mg/dL   Calcium 9.1 8.9 - 10.3 mg/dL   GFR calc non Af Amer >60 >60 mL/min   GFR calc Af Amer >60 >60 mL/min   Anion gap 8 5 - 15  CBC with Differential    Result Value Ref Range   WBC 7.3 4.0 - 10.5 K/uL   RBC 2.90 (L) 4.22 - 5.81 MIL/uL   Hemoglobin 9.2 (L) 13.0 - 17.0 g/dL   HCT  28.4 (L) 39.0 - 52.0 %   MCV 97.9 80.0 - 100.0 fL   MCH 31.7 26.0 - 34.0 pg   MCHC 32.4 30.0 - 36.0 g/dL   RDW 15.1 11.5 - 15.5 %   Platelets 85 (L) 150 - 400 K/uL   nRBC 1.1 (H) 0.0 - 0.2 %   Neutrophils Relative % 55 %   Neutro Abs 5.5 1.7 - 7.7 K/uL   Band Neutrophils 9 %   Lymphocytes Relative 17 %   Lymphs Abs 1.2 0.7 - 4.0 K/uL   Monocytes Relative 8 %   Monocytes Absolute 0.6 0.1 - 1.0 K/uL   Eosinophils Relative 0 %   Eosinophils Absolute 0.0 0.0 - 0.5 K/uL   Basophils Relative 0 %   Basophils Absolute 0.0 0.0 - 0.1 K/uL   Smear Review PLATELETS APPEAR DECREASED    Metamyelocytes Relative 5 %   Myelocytes 6 %   Ovalocytes PRESENT       Assessment & Plan:   Problem List Items Addressed This Visit      Genitourinary   Testicular cancer ([place])    Continue to follow with oncology. Call with any concerns.         Other   Current moderate episode of major depressive disorder without prior episode ([place]) - Primary    Not under good control. Not interested in preventative medicine right now. Discussed that I cannot prescribe benzodiazepines since he is on TID oxycodone. Offered wellbutrin, buspar and hydroxyzine. He was not interested in them. Referral to psychiatry offered. Call with any concerns.       Relevant Orders   TSH    Other Visit Diagnoses    Screening for cholesterol level       Labs drawn today. Await results.    Relevant Orders   Lipid Panel w/o Chol/HDL Ratio       Follow up plan: Return As able, for Physical.

## 2018-01-20 NOTE — Patient Instructions (Signed)
Bupropion tablets (Depression/Mood Disorders) What is this medicine? BUPROPION (byoo PROE pee on) is used to treat depression. This medicine may be used for other purposes; ask your health care provider or pharmacist if you have questions. COMMON BRAND NAME(S): Wellbutrin What should I tell my health care provider before I take this medicine? They need to know if you have any of these conditions: -an eating disorder, such as anorexia or bulimia -bipolar disorder or psychosis -diabetes or high blood sugar, treated with medication -glaucoma -heart disease, previous heart attack, or irregular heart beat -head injury or brain tumor -high blood pressure -kidney or liver disease -seizures -suicidal thoughts or a previous suicide attempt -Tourette's syndrome -weight loss -an unusual or allergic reaction to bupropion, other medicines, foods, dyes, or preservatives -breast-feeding -pregnant or trying to become pregnant How should I use this medicine? Take this medicine by mouth with a glass of water. Follow the directions on the prescription label. You can take it with or without food. If it upsets your stomach, take it with food. Take your medicine at regular intervals. Do not take your medicine more often than directed. Do not stop taking this medicine suddenly except upon the advice of your doctor. Stopping this medicine too quickly may cause serious side effects or your condition may worsen. A special MedGuide will be given to you by the pharmacist with each prescription and refill. Be sure to read this information carefully each time. Talk to your pediatrician regarding the use of this medicine in children. Special care may be needed. Overdosage: If you think you have taken too much of this medicine contact a poison control center or emergency room at once. NOTE: This medicine is only for you. Do not share this medicine with others. What if I miss a dose? If you miss a dose, take it as soon  as you can. If it is less than four hours to your next dose, take only that dose and skip the missed dose. Do not take double or extra doses. What may interact with this medicine? Do not take this medicine with any of the following medications: -linezolid -MAOIs like Azilect, Carbex, Eldepryl, Marplan, Nardil, and Parnate -methylene blue (injected into a vein) -other medicines that contain bupropion like Zyban This medicine may also interact with the following medications: -alcohol -certain medicines for anxiety or sleep -certain medicines for blood pressure like metoprolol, propranolol -certain medicines for depression or psychotic disturbances -certain medicines for HIV or AIDS like efavirenz, lopinavir, nelfinavir, ritonavir -certain medicines for irregular heart beat like propafenone, flecainide -certain medicines for Parkinson's disease like amantadine, levodopa -certain medicines for seizures like carbamazepine, phenytoin, phenobarbital -cimetidine -clopidogrel -cyclophosphamide -digoxin -furazolidone -isoniazid -nicotine -orphenadrine -procarbazine -steroid medicines like prednisone or cortisone -stimulant medicines for attention disorders, weight loss, or to stay awake -tamoxifen -theophylline -thiotepa -ticlopidine -tramadol -warfarin This list may not describe all possible interactions. Give your health care provider a list of all the medicines, herbs, non-prescription drugs, or dietary supplements you use. Also tell them if you smoke, drink alcohol, or use illegal drugs. Some items may interact with your medicine. What should I watch for while using this medicine? Tell your doctor if your symptoms do not get better or if they get worse. Visit your doctor or health care professional for regular checks on your progress. Because it may take several weeks to see the full effects of this medicine, it is important to continue your treatment as prescribed by your  doctor. Patients and their families  should watch out for new or worsening thoughts of suicide or depression. Also watch out for sudden changes in feelings such as feeling anxious, agitated, panicky, irritable, hostile, aggressive, impulsive, severely restless, overly excited and hyperactive, or not being able to sleep. If this happens, especially at the beginning of treatment or after a change in dose, call your health care professional. Avoid alcoholic drinks while taking this medicine. Drinking excessive alcoholic beverages, using sleeping or anxiety medicines, or quickly stopping the use of these agents while taking this medicine may increase your risk for a seizure. Do not drive or use heavy machinery until you know how this medicine affects you. This medicine can impair your ability to perform these tasks. Do not take this medicine close to bedtime. It may prevent you from sleeping. Your mouth may get dry. Chewing sugarless gum or sucking hard candy, and drinking plenty of water may help. Contact your doctor if the problem does not go away or is severe. What side effects may I notice from receiving this medicine? Side effects that you should report to your doctor or health care professional as soon as possible: -allergic reactions like skin rash, itching or hives, swelling of the face, lips, or tongue -breathing problems -changes in vision -confusion -elevated mood, decreased need for sleep, racing thoughts, impulsive behavior -fast or irregular heartbeat -hallucinations, loss of contact with reality -increased blood pressure -redness, blistering, peeling or loosening of the skin, including inside the mouth -seizures -suicidal thoughts or other mood changes -unusually weak or tired -vomiting Side effects that usually do not require medical attention (report to your doctor or health care professional if they continue or are bothersome): -constipation -headache -loss of  appetite -nausea -tremors -weight loss This list may not describe all possible side effects. Call your doctor for medical advice about side effects. You may report side effects to FDA at 1-800-FDA-1088. Where should I keep my medicine? Keep out of the reach of children. Store at room temperature between 20 and 25 degrees C (68 and 77 degrees F), away from direct sunlight and moisture. Keep tightly closed. Throw away any unused medicine after the expiration date. NOTE: This sheet is a summary. It may not cover all possible information. If you have questions about this medicine, talk to your doctor, pharmacist, or health care provider.  2018 Elsevier/Gold Standard (2015-08-23 13:44:21)  Living With Anxiety After being diagnosed with an anxiety disorder, you may be relieved to know why you have felt or behaved a certain way. It is natural to also feel overwhelmed about the treatment ahead and what it will mean for your life. With care and support, you can manage this condition and recover from it. How to cope with anxiety Dealing with stress Stress is your body's reaction to life changes and events, both good and bad. Stress can last just a few hours or it can be ongoing. Stress can play a major role in anxiety, so it is important to learn both how to cope with stress and how to think about it differently. Talk with your health care provider or a counselor to learn more about stress reduction. He or she may suggest some stress reduction techniques, such as:  Music therapy. This can include creating or listening to music that you enjoy and that inspires you.  Mindfulness-based meditation. This involves being aware of your normal breaths, rather than trying to control your breathing. It can be done while sitting or walking.  Centering prayer. This is a kind  of meditation that involves focusing on a word, phrase, or sacred image that is meaningful to you and that brings you peace.  Deep breathing.  To do this, expand your stomach and inhale slowly through your nose. Hold your breath for 3-5 seconds. Then exhale slowly, allowing your stomach muscles to relax.  Self-talk. This is a skill where you identify thought patterns that lead to anxiety reactions and correct those thoughts.  Muscle relaxation. This involves tensing muscles then relaxing them.  Choose a stress reduction technique that fits your lifestyle and personality. Stress reduction techniques take time and practice. Set aside 5-15 minutes a day to do them. Therapists can offer training in these techniques. The training may be covered by some insurance plans. Other things you can do to manage stress include:  Keeping a stress diary. This can help you learn what triggers your stress and ways to control your response.  Thinking about how you respond to certain situations. You may not be able to control everything, but you can control your reaction.  Making time for activities that help you relax, and not feeling guilty about spending your time in this way.  Therapy combined with coping and stress-reduction skills provides the best chance for successful treatment. Medicines Medicines can help ease symptoms. Medicines for anxiety include:  Anti-anxiety drugs.  Antidepressants.  Beta-blockers.  Medicines may be used as the main treatment for anxiety disorder, along with therapy, or if other treatments are not working. Medicines should be prescribed by a health care provider. Relationships Relationships can play a big part in helping you recover. Try to spend more time connecting with trusted friends and family members. Consider going to couples counseling, taking family education classes, or going to family therapy. Therapy can help you and others better understand the condition. How to recognize changes in your condition Everyone has a different response to treatment for anxiety. Recovery from anxiety happens when symptoms  decrease and stop interfering with your daily activities at home or work. This may mean that you will start to:  Have better concentration and focus.  Sleep better.  Be less irritable.  Have more energy.  Have improved memory.  It is important to recognize when your condition is getting worse. Contact your health care provider if your symptoms interfere with home or work and you do not feel like your condition is improving. Where to find help and support: You can get help and support from these sources:  Self-help groups.  Online and [employer].  A trusted spiritual leader.  Couples counseling.  Family education classes.  Family therapy.  Follow these instructions at home:  Eat a healthy diet that includes plenty of vegetables, fruits, whole grains, low-fat dairy products, and lean protein. Do not eat a lot of foods that are high in solid fats, added sugars, or salt.  Exercise. Most adults should do the following: ? Exercise for at least 150 minutes each week. The exercise should increase your heart rate and make you sweat (moderate-intensity exercise). ? Strengthening exercises at least twice a week.  Cut down on caffeine, tobacco, alcohol, and other potentially harmful substances.  Get the right amount and quality of sleep. Most adults need 7-9 hours of sleep each night.  Make choices that simplify your life.  Take over-the-counter and prescription medicines only as told by your health care provider.  Avoid caffeine, alcohol, and certain over-the-counter cold medicines. These may make you feel worse. Ask your pharmacist which medicines to avoid.  Keep all follow-up visits as told by your health care provider. This is important. Questions to ask your health care provider  Would I benefit from therapy?  How often should I follow up with a health care provider?  How long do I need to take medicine?  Are there any long-term side effects of my  medicine?  Are there any alternatives to taking medicine? Contact a health care provider if:  You have a hard time staying focused or finishing daily tasks.  You spend many hours a day feeling worried about everyday life.  You become exhausted by worry.  You start to have headaches, feel tense, or have nausea.  You urinate more than normal.  You have diarrhea. Get help right away if:  You have a racing heart and shortness of breath.  You have thoughts of hurting yourself or others. If you ever feel like you may hurt yourself or others, or have thoughts about taking your own life, get help right away. You can go to your nearest emergency department or call:  Your local emergency services (911 in the [place]  A suicide crisis helpline, such as the [facility] at 973 535 4885. This is open 24-hours a day.  Summary  Taking steps to deal with stress can help calm you.  Medicines cannot cure anxiety disorders, but they can help ease symptoms.  Family, friends, and partners can play a big part in helping you recover from an anxiety disorder. This information is not intended to replace advice given to you by your health care provider. Make sure you discuss any questions you have with your health care provider. Document Released: 02/25/2016 Document Revised: 02/25/2016 Document Reviewed: 02/25/2016 Elsevier Interactive Patient Education  2018 [place].  Generalized Anxiety Disorder, Adult Generalized anxiety disorder (GAD) is a mental health disorder. People with this condition constantly worry about everyday events. Unlike normal anxiety, worry related to GAD is not triggered by a specific event. These worries also do not fade or get better with time. GAD interferes with life functions, including relationships, work, and school. GAD can vary from mild to severe. People with severe GAD can have intense waves of anxiety with physical symptoms (panic  attacks). What are the causes? The exact cause of GAD is not known. What increases the risk? This condition is more likely to develop in:  Women.  People who have a family history of anxiety disorders.  People who are very shy.  People who experience very stressful life events, such as the death of a loved one.  People who have a very stressful family environment.  What are the signs or symptoms? People with GAD often worry excessively about many things in their lives, such as their health and family. They may also be overly concerned about:  Doing well at work.  Being on time.  Natural disasters.  Friendships.  Physical symptoms of GAD include:  Fatigue.  Muscle tension or having muscle twitches.  Trembling or feeling shaky.  Being easily startled.  Feeling like your heart is pounding or racing.  Feeling out of breath or like you cannot take a deep breath.  Having trouble falling asleep or staying asleep.  Sweating.  Nausea, diarrhea, or irritable bowel syndrome (IBS).  Headaches.  Trouble concentrating or remembering facts.  Restlessness.  Irritability.  How is this diagnosed? Your health care provider can diagnose GAD based on your symptoms and medical history. You will also have a physical exam. The health care provider will  ask specific questions about your symptoms, including how severe they are, when they started, and if they come and go. Your health care provider may ask you about your use of alcohol or drugs, including prescription medicines. Your health care provider may refer you to a mental health specialist for further evaluation. Your health care provider will do a thorough examination and may perform additional tests to rule out other possible causes of your symptoms. To be diagnosed with GAD, a person must have anxiety that:  Is out of his or her control.  Affects several different aspects of his or her life, such as work and  relationships.  Causes distress that makes him or her unable to take part in normal activities.  Includes at least three physical symptoms of GAD, such as restlessness, fatigue, trouble concentrating, irritability, muscle tension, or sleep problems.  Before your health care provider can confirm a diagnosis of GAD, these symptoms must be present more days than they are not, and they must last for six months or longer. How is this treated? The following therapies are usually used to treat GAD:  Medicine. Antidepressant medicine is usually prescribed for long-term daily control. Antianxiety medicines may be added in severe cases, especially when panic attacks occur.  Talk therapy (psychotherapy). Certain types of talk therapy can be helpful in treating GAD by providing support, education, and guidance. Options include: ? Cognitive behavioral therapy (CBT). People learn coping skills and techniques to ease their anxiety. They learn to identify unrealistic or negative thoughts and behaviors and to replace them with positive ones. ? Acceptance and commitment therapy (ACT). This treatment teaches people how to be mindful as a way to cope with unwanted thoughts and feelings. ? Biofeedback. This process trains you to manage your body's response (physiological response) through breathing techniques and relaxation methods. You will work with a therapist while machines are used to monitor your physical symptoms.  Stress management techniques. These include yoga, meditation, and exercise.  A mental health specialist can help determine which treatment is best for you. Some people see improvement with one type of therapy. However, other people require a combination of therapies. Follow these instructions at home:  Take over-the-counter and prescription medicines only as told by your health care provider.  Try to maintain a normal routine.  Try to anticipate stressful situations and allow extra time to  manage them.  Practice any stress management or self-calming techniques as taught by your health care provider.  Do not punish yourself for setbacks or for not making progress.  Try to recognize your accomplishments, even if they are small.  Keep all follow-up visits as told by your health care provider. This is important. Contact a health care provider if:  Your symptoms do not get better.  Your symptoms get worse.  You have signs of depression, such as: ? A persistently sad, cranky, or irritable mood. ? Loss of enjoyment in activities that used to bring you joy. ? Change in weight or eating. ? Changes in sleeping habits. ? Avoiding friends or family members. ? Loss of energy for normal tasks. ? Feelings of guilt or worthlessness. Get help right away if:  You have serious thoughts about hurting yourself or others. If you ever feel like you may hurt yourself or others, or have thoughts about taking your own life, get help right away. You can go to your nearest emergency department or call:  Your local emergency services (911 in the [place]  A suicide crisis helpline,  such as the [facility] at 504-722-7606. This is open 24 hours a day.  Summary  Generalized anxiety disorder (GAD) is a mental health disorder that involves worry that is not triggered by a specific event.  People with GAD often worry excessively about many things in their lives, such as their health and family.  GAD may cause physical symptoms such as restlessness, trouble concentrating, sleep problems, frequent sweating, nausea, diarrhea, headaches, and trembling or muscle twitching.  A mental health specialist can help determine which treatment is best for you. Some people see improvement with one type of therapy. However, other people require a combination of therapies. This information is not intended to replace advice given to you by your health care provider. Make sure you discuss  any questions you have with your health care provider. Document Released: 06/27/2012 Document Revised: 01/21/2016 Document Reviewed: 01/21/2016 Elsevier Interactive Patient Education  [employer].

## 2018-01-20 NOTE — Assessment & Plan Note (Signed)
Not under good control. Not interested in preventative medicine right now. Discussed that I cannot prescribe benzodiazepines since he is on TID oxycodone. Offered wellbutrin, buspar and hydroxyzine. He was not interested in them. Referral to psychiatry offered. Call with any concerns.

## 2018-01-20 NOTE — Assessment & Plan Note (Signed)
Continue to follow with oncology. Call with any concerns.  

## 2018-01-21 ENCOUNTER — Inpatient Hospital Stay: Payer: BLUE CROSS/BLUE SHIELD

## 2018-01-21 ENCOUNTER — Encounter: Payer: Self-pay | Admitting: Family Medicine

## 2018-01-21 ENCOUNTER — Encounter: Payer: Self-pay | Admitting: Urgent Care

## 2018-01-21 ENCOUNTER — Other Ambulatory Visit: Payer: Medicaid Other

## 2018-01-21 ENCOUNTER — Ambulatory Visit: Payer: Medicaid Other | Admitting: Hematology and Oncology

## 2018-01-21 DIAGNOSIS — Z86718 Personal history of other venous thrombosis and embolism: Secondary | ICD-10-CM | POA: Diagnosis not present

## 2018-01-21 DIAGNOSIS — Z9079 Acquired absence of other genital organ(s): Secondary | ICD-10-CM | POA: Diagnosis not present

## 2018-01-21 DIAGNOSIS — G620 Drug-induced polyneuropathy: Secondary | ICD-10-CM | POA: Diagnosis not present

## 2018-01-21 DIAGNOSIS — Z7901 Long term (current) use of anticoagulants: Secondary | ICD-10-CM | POA: Diagnosis not present

## 2018-01-21 DIAGNOSIS — Z9221 Personal history of antineoplastic chemotherapy: Secondary | ICD-10-CM | POA: Diagnosis not present

## 2018-01-21 DIAGNOSIS — E785 Hyperlipidemia, unspecified: Secondary | ICD-10-CM | POA: Insufficient documentation

## 2018-01-21 DIAGNOSIS — T451X5A Adverse effect of antineoplastic and immunosuppressive drugs, initial encounter: Secondary | ICD-10-CM | POA: Diagnosis not present

## 2018-01-21 DIAGNOSIS — Z79899 Other long term (current) drug therapy: Secondary | ICD-10-CM | POA: Diagnosis not present

## 2018-01-21 DIAGNOSIS — C6292 Malignant neoplasm of left testis, unspecified whether descended or undescended: Secondary | ICD-10-CM | POA: Diagnosis not present

## 2018-01-21 DIAGNOSIS — F1290 Cannabis use, unspecified, uncomplicated: Secondary | ICD-10-CM | POA: Diagnosis not present

## 2018-01-21 DIAGNOSIS — L982 Febrile neutrophilic dermatosis [Sweet]: Secondary | ICD-10-CM | POA: Diagnosis not present

## 2018-01-21 DIAGNOSIS — Z7952 Long term (current) use of systemic steroids: Secondary | ICD-10-CM | POA: Diagnosis not present

## 2018-01-21 DIAGNOSIS — C6212 Malignant neoplasm of descended left testis: Secondary | ICD-10-CM

## 2018-01-21 DIAGNOSIS — F064 Anxiety disorder due to known physiological condition: Secondary | ICD-10-CM | POA: Diagnosis not present

## 2018-01-21 DIAGNOSIS — I82412 Acute embolism and thrombosis of left femoral vein: Secondary | ICD-10-CM | POA: Diagnosis not present

## 2018-01-21 DIAGNOSIS — F17210 Nicotine dependence, cigarettes, uncomplicated: Secondary | ICD-10-CM | POA: Diagnosis not present

## 2018-01-21 LAB — CBC WITH DIFFERENTIAL/PLATELET
Abs Immature Granulocytes: 0.31 10*3/uL — ABNORMAL HIGH (ref 0.00–0.07)
Basophils Absolute: 0 10*3/uL (ref 0.0–0.1)
Basophils Relative: 0 %
Eosinophils Absolute: 0 10*3/uL (ref 0.0–0.5)
Eosinophils Relative: 0 %
HCT: 29.9 % — ABNORMAL LOW (ref 39.0–52.0)
Hemoglobin: 9.6 g/dL — ABNORMAL LOW (ref 13.0–17.0)
Immature Granulocytes: 6 %
Lymphocytes Relative: 18 %
Lymphs Abs: 0.9 10*3/uL (ref 0.7–4.0)
MCH: 31.6 pg (ref 26.0–34.0)
MCHC: 32.1 g/dL (ref 30.0–36.0)
MCV: 98.4 fL (ref 80.0–100.0)
Monocytes Absolute: 0.9 10*3/uL (ref 0.1–1.0)
Monocytes Relative: 16 %
Neutro Abs: 3.2 10*3/uL (ref 1.7–7.7)
Neutrophils Relative %: 60 %
Platelets: 218 10*3/uL (ref 150–400)
RBC: 3.04 MIL/uL — ABNORMAL LOW (ref 4.22–5.81)
RDW: 16.2 % — ABNORMAL HIGH (ref 11.5–15.5)
WBC: 5.3 10*3/uL (ref 4.0–10.5)
nRBC: 0.7 % — ABNORMAL HIGH (ref 0.0–0.2)

## 2018-01-21 LAB — BASIC METABOLIC PANEL
Anion gap: 10 (ref 5–15)
BUN: 18 mg/dL (ref 6–20)
CO2: 27 mmol/L (ref 22–32)
Calcium: 9.6 mg/dL (ref 8.9–10.3)
Chloride: 102 mmol/L (ref 98–111)
Creatinine, Ser: 0.88 mg/dL (ref 0.61–1.24)
GFR calc Af Amer: >60 mL/min (ref >60)
GFR calc non Af Amer: >60 mL/min (ref >60)
Glucose, Bld: 95 mg/dL (ref 70–99)
Potassium: 4.2 mmol/L (ref 3.5–5.1)
Sodium: 139 mmol/L (ref 135–145)

## 2018-01-21 LAB — LIPID PANEL W/O CHOL/HDL RATIO
CHOLESTEROL TOTAL: 240 mg/dL — AB (ref 100–199)
HDL: 51 mg/dL (ref >39)
LDL Calculated: 161 mg/dL — ABNORMAL HIGH (ref 0–99)
Triglycerides: 142 mg/dL (ref 0–149)
VLDL Cholesterol Cal: 28 mg/dL (ref 5–40)

## 2018-01-21 LAB — TSH: TSH: 1.17 u[IU]/mL (ref 0.450–4.500)

## 2018-01-21 MED ORDER — CLONAZEPAM 0.5 MG PO TBDP: 0.5000 mg | ORAL_TABLET | Freq: Two times a day (BID) | ORAL | 0 refills | Status: DC | PRN

## 2018-01-21 MED ORDER — OXYCODONE HCL 10 MG PO TABS: 10.0000 mg | ORAL_TABLET | Freq: Three times a day (TID) | ORAL | 0 refills | Status: DC | PRN

## 2018-01-21 NOTE — Telephone Encounter (Signed)
Narcotic refill request: 01/21/18  [place] CSRS database reviewed prior to consideration of refills:  NARX scores --    Narcotic: 540  Sedative: 441  30 day average MME/day: 49.00  30 day average LME/day: 2.20  ORS score (range 0-999): 570   Providers outside of this practice prescribing narcotics/sedatives in the last 90 days: YES  Given a current oncology diagnosis (recurrent testicular cancer), this patient has the potential to experience significant cancer related pain. 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 Benefits versus risks associated with continued therapy considered. Will continue pain management with opioids as previously prescribed.   Medication safety precautions reviewed, as patient is on concurrent therapy using a BZO (clonazepam).  Patient verbalized understanding that medications should not be sold or shared, taken with alcohol, or used while driving. Patient educated that medications should not be bitten, chewed, or crushed. He has been made aware of the side effects of using these medications. Patient understands that these medications can cause CNS depression, increase his risk of falls, and even lead to overdose that may result in death, if used outside of the parameters that he and I discussed. He has been asked to stagger opioid and BZO to prevent oversedation.   With all of this in mind, he accepts the risks and responsibilities associated with therapy and elects to continue to use the prescribed interventions.      Refill prescription sent in for: 1. Roxicodone 10 mg TID PRN (Disp #30) 2. Clonazepam 0.5 mg BID PRN (Disp #30)  Honor Loh, MSN, APRN, FNP-C, CEN Oncology/Hematology Nurse Practitioner  [facility] 01/21/18, 1:26 PM

## 2018-01-25 ENCOUNTER — Encounter: Payer: Self-pay | Admitting: *Deleted

## 2018-01-25 ENCOUNTER — Encounter: Payer: Self-pay | Admitting: Urgent Care

## 2018-01-25 ENCOUNTER — Encounter: Payer: Self-pay | Admitting: Hematology and Oncology

## 2018-01-27 ENCOUNTER — Other Ambulatory Visit: Payer: Self-pay | Admitting: *Deleted

## 2018-01-27 ENCOUNTER — Encounter: Payer: Self-pay | Admitting: Urgent Care

## 2018-01-27 DIAGNOSIS — C6212 Malignant neoplasm of descended left testis: Secondary | ICD-10-CM

## 2018-01-28 ENCOUNTER — Other Ambulatory Visit: Payer: Self-pay | Admitting: Urgent Care

## 2018-01-28 ENCOUNTER — Telehealth: Payer: Self-pay | Admitting: *Deleted

## 2018-02-01 ENCOUNTER — Ambulatory Visit: Payer: BLUE CROSS/BLUE SHIELD

## 2018-02-01 ENCOUNTER — Encounter: Payer: Self-pay | Admitting: Urgent Care

## 2018-02-01 DIAGNOSIS — F419 Anxiety disorder, unspecified: Secondary | ICD-10-CM | POA: Insufficient documentation

## 2018-02-03 ENCOUNTER — Ambulatory Visit
Admission: RE | Admit: 2018-02-03 | Discharge: 2018-02-03 | Disposition: A | Discharge status: Home / Self Care | Payer: BLUE CROSS/BLUE SHIELD | Source: Ambulatory Visit | Attending: Urgent Care | Admitting: Urgent Care

## 2018-02-03 DIAGNOSIS — C6212 Malignant neoplasm of descended left testis: Secondary | ICD-10-CM | POA: Insufficient documentation

## 2018-02-03 DIAGNOSIS — C6292 Malignant neoplasm of left testis, unspecified whether descended or undescended: Secondary | ICD-10-CM | POA: Diagnosis not present

## 2018-02-03 DIAGNOSIS — R599 Enlarged lymph nodes, unspecified: Secondary | ICD-10-CM | POA: Diagnosis not present

## 2018-02-03 LAB — GLUCOSE, CAPILLARY: Glucose-Capillary: 80 mg/dL (ref 70–99)

## 2018-02-03 MED ORDER — FLUDEOXYGLUCOSE F - 18 (FDG) INJECTION
13.5000 | Freq: Once | INTRAVENOUS | Status: AC | PRN
  Administered 2018-02-03: 13.53 via INTRAVENOUS

## 2018-02-04 ENCOUNTER — Other Ambulatory Visit: Payer: Self-pay | Admitting: Urgent Care

## 2018-02-04 ENCOUNTER — Inpatient Hospital Stay: Payer: BLUE CROSS/BLUE SHIELD

## 2018-02-04 ENCOUNTER — Inpatient Hospital Stay (HOSPITAL_BASED_OUTPATIENT_CLINIC_OR_DEPARTMENT_OTHER): Payer: BLUE CROSS/BLUE SHIELD | Admitting: Hematology and Oncology

## 2018-02-04 ENCOUNTER — Encounter: Payer: Self-pay | Admitting: Urgent Care

## 2018-02-04 ENCOUNTER — Other Ambulatory Visit: Payer: Self-pay

## 2018-02-04 ENCOUNTER — Other Ambulatory Visit: Payer: Self-pay | Admitting: Hematology and Oncology

## 2018-02-04 VITALS — BP 129/92 | HR 90 | Resp 18 | Wt 257.2 lb

## 2018-02-04 DIAGNOSIS — Z86718 Personal history of other venous thrombosis and embolism: Secondary | ICD-10-CM

## 2018-02-04 DIAGNOSIS — Z9221 Personal history of antineoplastic chemotherapy: Secondary | ICD-10-CM | POA: Diagnosis not present

## 2018-02-04 DIAGNOSIS — F17210 Nicotine dependence, cigarettes, uncomplicated: Secondary | ICD-10-CM

## 2018-02-04 DIAGNOSIS — G620 Drug-induced polyneuropathy: Secondary | ICD-10-CM | POA: Diagnosis not present

## 2018-02-04 DIAGNOSIS — I82412 Acute embolism and thrombosis of left femoral vein: Secondary | ICD-10-CM | POA: Diagnosis not present

## 2018-02-04 DIAGNOSIS — Z7952 Long term (current) use of systemic steroids: Secondary | ICD-10-CM | POA: Diagnosis not present

## 2018-02-04 DIAGNOSIS — C6212 Malignant neoplasm of descended left testis: Secondary | ICD-10-CM | POA: Diagnosis not present

## 2018-02-04 DIAGNOSIS — F064 Anxiety disorder due to known physiological condition: Secondary | ICD-10-CM

## 2018-02-04 DIAGNOSIS — C6292 Malignant neoplasm of left testis, unspecified whether descended or undescended: Secondary | ICD-10-CM | POA: Diagnosis not present

## 2018-02-04 DIAGNOSIS — T451X5A Adverse effect of antineoplastic and immunosuppressive drugs, initial encounter: Secondary | ICD-10-CM | POA: Diagnosis not present

## 2018-02-04 DIAGNOSIS — L982 Febrile neutrophilic dermatosis [Sweet]: Secondary | ICD-10-CM

## 2018-02-04 DIAGNOSIS — Z7189 Other specified counseling: Secondary | ICD-10-CM

## 2018-02-04 DIAGNOSIS — Z7901 Long term (current) use of anticoagulants: Secondary | ICD-10-CM | POA: Diagnosis not present

## 2018-02-04 DIAGNOSIS — Z79899 Other long term (current) drug therapy: Secondary | ICD-10-CM

## 2018-02-04 DIAGNOSIS — F1290 Cannabis use, unspecified, uncomplicated: Secondary | ICD-10-CM | POA: Diagnosis not present

## 2018-02-04 DIAGNOSIS — Z9079 Acquired absence of other genital organ(s): Secondary | ICD-10-CM | POA: Diagnosis not present

## 2018-02-04 LAB — CBC WITH DIFFERENTIAL/PLATELET
Abs Immature Granulocytes: 0.02 10*3/uL (ref 0.00–0.07)
Basophils Absolute: 0.1 10*3/uL (ref 0.0–0.1)
Basophils Relative: 1 %
Eosinophils Absolute: 0.2 10*3/uL (ref 0.0–0.5)
Eosinophils Relative: 2 %
HCT: 35.7 % — ABNORMAL LOW (ref 39.0–52.0)
Hemoglobin: 11.2 g/dL — ABNORMAL LOW (ref 13.0–17.0)
Immature Granulocytes: 0 %
Lymphocytes Relative: 15 %
Lymphs Abs: 1.1 10*3/uL (ref 0.7–4.0)
MCH: 30.4 pg (ref 26.0–34.0)
MCHC: 31.4 g/dL (ref 30.0–36.0)
MCV: 97 fL (ref 80.0–100.0)
Monocytes Absolute: 0.7 10*3/uL (ref 0.1–1.0)
Monocytes Relative: 9 %
Neutro Abs: 5.2 10*3/uL (ref 1.7–7.7)
Neutrophils Relative %: 73 %
Platelets: 366 10*3/uL (ref 150–400)
RBC: 3.68 MIL/uL — ABNORMAL LOW (ref 4.22–5.81)
RDW: 14.6 % (ref 11.5–15.5)
WBC: 7.3 10*3/uL (ref 4.0–10.5)
nRBC: 0 % (ref 0.0–0.2)

## 2018-02-04 LAB — COMPREHENSIVE METABOLIC PANEL
ALT: 15 U/L (ref 0–44)
AST: 16 U/L (ref 15–41)
Albumin: 4.7 g/dL (ref 3.5–5.0)
Alkaline Phosphatase: 48 U/L (ref 38–126)
Anion gap: 6 (ref 5–15)
BUN: 14 mg/dL (ref 6–20)
CO2: 26 mmol/L (ref 22–32)
Calcium: 10.1 mg/dL (ref 8.9–10.3)
Chloride: 105 mmol/L (ref 98–111)
Creatinine, Ser: 0.76 mg/dL (ref 0.61–1.24)
GFR calc Af Amer: >60 mL/min (ref >60)
GFR calc non Af Amer: >60 mL/min (ref >60)
Glucose, Bld: 103 mg/dL — ABNORMAL HIGH (ref 70–99)
Potassium: 3.9 mmol/L (ref 3.5–5.1)
Sodium: 137 mmol/L (ref 135–145)
Total Bilirubin: 0.5 mg/dL (ref 0.3–1.2)
Total Protein: 7.7 g/dL (ref 6.5–8.1)

## 2018-02-04 LAB — MAGNESIUM: Magnesium: 2 mg/dL (ref 1.7–2.4)

## 2018-02-04 LAB — LACTATE DEHYDROGENASE: LDH: 133 U/L (ref 98–192)

## 2018-02-04 MED ORDER — OXYCODONE HCL 10 MG PO TABS: 10.0000 mg | ORAL_TABLET | Freq: Three times a day (TID) | ORAL | 0 refills | Status: DC | PRN

## 2018-02-04 MED ORDER — CLONAZEPAM 0.5 MG PO TBDP: 0.5000 mg | ORAL_TABLET | Freq: Two times a day (BID) | ORAL | 0 refills | Status: DC | PRN

## 2018-02-04 NOTE — Progress Notes (Signed)
[facility] day:  02/04/2018  Chief Complaint: Edgar [name] is a 27 y.o. male with recurrent testicular cancer who is seen for review of end of therapy PET scan, assessment on day 40 s/p cycle #4 TIP chemotherapy, and discussion regarding direction of therapy.  HPI: The patient was last seen in the medical oncology clinic on 01/14/2018.  At that time, he was day 19 s/p cycle #4 TIP chemotherapy.  Symptomatically, he was recovering slowly from his last cycle of chemotherapy.  He denied any nausea, vomiting, or diarrhea.  He denied any B symptoms.  He was eating well; weight up 8 pounds.  Pain was well controlled. LEFT lower extremity wound continues to improve with twice daily wound care and chronic steroids.  WBC was 9100 ([place] 6900).  Hemoglobin was 9.1, hematocrit 27.7, and platelets 38,000.   He was on a steroid taper by Dr. Marolyn Hammock.  CBC on 01/17/2018 revealed a hematocrit of 28.4, hemoglobin 9.2, MCV 97.9, platelets 85,000, and WBC 7300.  He restarted Xarelto.  He saw Dr. 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  He noted that the left calf progressed to pyoderma gangrenosum, but was healing well, albeit slowly.  There was no evidence of infection.  He was to continue clobetasol ointment under occlusion daily.  He was to continue prednisone 10 mg every other day x 4 days then stop. He declined intralesional triamcinolone.  He has a follow-up on 02/18/2018.  PET scan was delayed secondary to initial insurance denial.  PET scan on 02/03/2018 revealed left common and external iliac adenopathy with only low-grade activity, less than that of the blood pool. The dominant lymph node is an external iliac node measuring 1.8 x 2.4 cm (previously 2.3 x 3.2 cm). There was no new or enlarging adenopathy.  There was low-grade activity along the left inguinal ring region near the orchectomy site, thought to be postoperative and not due to local  malignancy.  During the interim, patient is doing well. He denies any acute concerns. He feels generally well today. He is accompanied by his fiance today. She describes patient as being "more motivated". Patient notes that his energy level is "getting better". He is at about "75% of where I was". Neuropathy in his hands is "ok", however it remains significant in his feet. RIGHT foot issues have resolved s/p previous plantar puncture wound.   Patient denies that he has experienced any B symptoms. He denies any interval infections. He denies any nausea, vomiting, or changes to his bowel habits. Wound to LEFT calf continues to improve with daily wound care.   Patient advises that he maintains an adequate appetite. He is eating well. Weight today is 257 lb 3 oz (116.7 kg), which compared to his last visit to the clinic, represents a 6 pound decrease.    Patient complains of pain rated 3/10 in the clinic today.   Past Medical History:  Diagnosis Date  . Asthma    AS A CHILD-NO INHALERS  . Cancer ([place])    testicular cancer 11/2016 per pt   . DVT (deep venous thrombosis) ([place])   . GERD (gastroesophageal reflux disease)    TUMS PRN    Past Surgical History:  Procedure Laterality Date  . ANKLE FRACTURE SURGERY Right 2004  . FINE NEEDLE ASPIRATION BIOPSY N/A 11/09/2017   Procedure: FINE NEEDLE ASPIRATION BIOPSY;  Surgeon: [name], MD;  Location: [place] [facility];  Service: Cardiovascular;  Laterality: N/A;  . IVC FILTER INSERTION    . ORCHIECTOMY Left 11/26/2015   Procedure: ORCHIECTOMY;  Surgeon: [name], MD;  Location: [facility] OR[facility];  Service: Urology;  Laterality: Left;  radical/Inguinal approach  . PERIPHERAL VASCULAR CATHETERIZATION N/A 12/16/2015   Procedure: [name] Cath Insertion;  Surgeon: [name], MD;  Location: [facility];  Service: Cardiovascular;  Laterality: N/A;  . PERIPHERAL VASCULAR THROMBECTOMY Left 10/13/2017   Procedure: PERIPHERAL VASCULAR  THROMBECTOMY;  Surgeon: [name], MD;  Location: [place] [facility];  Service: Cardiovascular;  Laterality: Left;  . WISDOM TOOTH EXTRACTION  2017    Family History  Problem Relation Age of Onset  . Skin cancer Mother   . Breast cancer Maternal Grandmother   . Colon cancer Maternal Grandfather   . Diabetes Maternal Grandfather   . Emphysema Father   . Mental illness Sister        anxiety  . Stroke Paternal Grandmother   . Prostate cancer Neg Hx   . Kidney cancer Neg Hx   . Bladder Cancer Neg Hx     Social History:  reports that he has been smoking cigarettes. He has a 4.00 pack-year smoking history. He has quit using smokeless tobacco.  His smokeless tobacco use included snuff. He reports that he has current or past drug history. Drug: Marijuana. He reports that he does not drink alcohol.  He smokes 1/2 pack a day.  He smokes marijuana.  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  He has a 7 year-old-daughter named [name]. The patient's mother's cell phone number is [phone].  Another contact number is [phone].  The patient lives in [place]. He is accompanied by his girlfriend today.   Allergies: No Known Allergies  Current Medications: Current Outpatient Medications  Medication Sig Dispense Refill  . acyclovir (ZOVIRAX) 400 MG tablet Take 400 mg by mouth 2 (two) times daily.    . clobetasol ointment (TEMOVATE) 6.60 % Apply 1 application topically 2 (two) times daily as needed (bandage changes).     . clonazePAM (KLONOPIN) 0.5 MG disintegrating tablet Take 1 tablet (0.5 mg total) by mouth 2 (two) times daily as needed (anxiety). 30 tablet 0  . gabapentin (NEURONTIN) 300 MG capsule Take 300 mg by mouth at bedtime.    Marland Kitchen loratadine (CLARITIN) 10 MG tablet Take 1 tablet (10 mg total) by mouth daily. 30 tablet 1  . magnesium oxide (MAG-OX) 400 MG tablet Take 400 mg by mouth daily.    Marland Kitchen omeprazole (PRILOSEC) 20 MG capsule Take 1 capsule (20 mg total)  by mouth daily. 30 capsule 1  . Oxycodone HCl 10 MG TABS Take 1 tablet (10 mg total) by mouth every 8 (eight) hours as needed. 30 tablet 0  . potassium chloride (K-DUR) 10 MEQ tablet Take 10 mEq by mouth 2 (two) times daily.    Alveda Reasons 20 MG TABS tablet Take 20 mg by mouth daily.     No current facility-administered medications for this visit.    Facility-Administered Medications Ordered in Other Visits  Medication Dose Route Frequency Provider Last Rate Last Dose  . filgrastim-sndz (ZARXIO) injection 480 mcg  480 mcg Subcutaneous Once [name], MD      . Karle Barr [facility]) injection 480 mcg  480 mcg Subcutaneous Once [name], MD        Review of Systems  Constitutional: Positive for malaise/fatigue (Improving). Negative for chills, diaphoresis, fever and weight loss (  6 pounds).       Feels well.  75% of baseline.  HENT: Negative.  Negative for congestion, nosebleeds, sinus pain and sore throat.   Eyes: Negative.  Negative for double vision, pain and discharge.  Respiratory: Negative.  Negative for cough, hemoptysis, sputum production and shortness of breath.   Cardiovascular: Negative.  Negative for chest pain, palpitations, orthopnea, leg swelling and PND.  Gastrointestinal: Negative.  Negative for abdominal pain, blood in stool, constipation, diarrhea, melena and nausea.  Genitourinary: Negative.  Negative for dysuria, frequency, hematuria and urgency.  Musculoskeletal: Positive for back pain. Negative for falls, joint pain, myalgias and neck pain.  Skin: Negative.  Negative for itching and rash.       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  Neurological: Positive for sensory change (neuropathy in fingertips and feet). Negative for dizziness, tremors, focal weakness and weakness.  Endo/Heme/Allergies: Negative.  Does not bruise/bleed easily.  Psychiatric/Behavioral: Negative for depression, memory loss and suicidal ideas. The  patient is nervous/anxious. The patient does not have insomnia.   All other systems reviewed and are negative.  Performance status (ECOG): 1  Vital Signs BP (!) 129/92 (BP Location: Left Arm, Patient Position: Sitting)   Pulse 90   Resp 18   Wt 257 lb 3 oz (116.7 kg)   BMI 32.58 kg/m   Physical Exam  Constitutional: He is oriented to person, place, and time and well-developed, well-nourished, and in no distress. No distress.  HENT:  Head: Normocephalic and atraumatic. Hair is abnormal (chemotherapy induced alopecia).  Mouth/Throat: Oropharynx is clear and moist and mucous membranes are normal. Abnormal dentition (poor). No oropharyngeal exudate.  Alopecia.  Eyes: Pupils are equal, round, and reactive to light. Conjunctivae and EOM are normal. No scleral icterus.  Neck: Normal range of motion. Neck supple. No JVD present.  Cardiovascular: Normal rate, regular rhythm, normal heart sounds and intact distal pulses. Exam reveals no gallop and no friction rub.  No murmur heard. Pulmonary/Chest: Effort normal and breath sounds normal. No respiratory distress. He has no wheezes. He has no rales.  Abdominal: Soft. Bowel sounds are normal. He exhibits no distension and no mass. There is no abdominal tenderness. There is no rebound and no guarding.  Musculoskeletal: Normal range of motion.        General: No tenderness or edema.  Lymphadenopathy:    He has no cervical adenopathy.    He has no axillary adenopathy.       Right: No supraclavicular adenopathy present.       Left: No supraclavicular adenopathy present.  Neurological: He is alert and oriented to person, place, and time.  Skin: Skin is warm and dry. He is not diaphoretic.     Left calf lesion 4.5 x 3.5 cm, healing.  Psychiatric: Mood, affect and judgment normal.  Nursing note and vitals reviewed.   Medical photography: Lesion to posterior aspect of LEFT lower extremity    12/17/2017 - Initial                    02/04/2018                     01/14/2018                    12/27/2017                    12/20/2017  Imaging studies: 11/28/2015:  Chest, abdomen, and pelvic CT revealed no mediastinal adenopathy or suspicious pulmonary nodules.   There were 2 prominent left periaortic retroperitoneal lymph nodes (1.7 cm and 0.9 cm) concerning for testicular nodal metastasis.  There were no abnormal lymph nodes above the renal veins.  There were postsurgical change in the left hemiscrotum and left inguinal canal.  There was a 3.3 cm soft tissue abnormality anterior to the left iliopsoas muscle which could represent a metastatic lymph node (N2 lesion).  12/17/2015:  Head MRI revealed no evidence of metastatic disease.   03/30/2016:  Chest, abdomen, and pelvic CT revealed interval resolution of left abdominal/pelvic lymphadenopathy.  There was no residual metastatic disease. 09/30/2016:  Chest, abdomen, and pelvic CT revealed no evidence of recurrent or metastatic disease.  09/10/2017:  Bilateral lower extremity duplex revealed evidence of obstruction in the left CFV, SFJ, and proximal femoral vein.  09/10/2017:  CT pelvic venogram revealed multiple retroperitoneal masses including large mass within the left iliacus concerning for metastatic disease in the setting of prior testicular cancer.  Mass in the left iliacus caused significant narrowing of the proximal left external iliac vein. There was filling defect within the distal left external iliac vein extending into the common femoral and proximal superficial femoral veins consistent with thrombus  09/11/2017:  Chest, abdomen, and pelvic CT revealed enlarged and morphologically abnormal 4.3 x 4.9 cm left pelvic sidewall and retroperitoneal lymph nodes (measuring up to 2.9 cm) concerning for metastatic disease given history of testicular cancer. Biopsy was suggested for confirmation. Prominent left inguinal lymph nodes could be metastatic or  reactive in nature.  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  There was anasarca/swelling of the left lower extremity.  There was no evidence of metastatic disease in the chest. 09/13/2017:  Head MRI revealed no evidence of metastatic disease. 11/17/2017:  Right foot MRI revealed artifact in the plantar soft tissues over the first metatarsal head likely representing metallic foreign bodies as seen on previous radiographs.  There was an area of increased T2 signal intensity in the plantar aspect of the first metatarsal head with focal enhancement may indicate an area of focal osteomyelitis.  There was no soft tissue abscess. 12/07/2017:  Abdomen and pelvic CTrevealed no new or progressive metastatic disease.There was no acute vascular abnormality. IVC filter and left iliac venous stent graft werein place. There was persistent postsurgical fat stranding throughout the leftinguinal region without fluid collection 12/08/2017:  Bilateral lower extremity duplex revealed new nonocclusive thrombus in the left common femoralveinalong the anterior wall measuring 3-5 mm in thickness. This wasnot flow limiting but is new since the prior study on 11/05/2017 and consistent with acute/subacute thrombus (old per Dr. Delana Meyer). There was no evidence of right lower extremity deep vein thrombosis 02/03/2018:  PET scan revealed left common and external iliac adenopathy with only low-grade activity, less than that of the blood pool. The dominant lymph node is an external iliac node measuring 1.8 x 2.4 cm (previously 2.3 x 3.2 cm). There was no new or enlarging adenopathy.  There was low-grade activity along the left inguinal ring region near the orchectomy site, thought to be postoperative and not due to local malignancy.   Admissions: He was admitted to [facility] from 10/08/2017 - 10/16/2017 with left lower extremity cellulitis.  He was treated with  Cefepime and vancomycin.  Imaging revealed significant improvement in adenopathy with decreased pelvic sidewall disease and decreased bulk at  aortic bifurcation.  He was felt to have venous congestion syndrome with skin lesions due to congestion.   He was admitted to [facility] from 10/21/2017 - 10/26/2017 for inpatient chemotherapy (cycle #2 TIP).    He was admitted to [facility] from 11/05/2017 - 11/09/2017 for cellulitis.  He was developing similar lower extremity skin lesions following his first cycle of chemotherapy.  Skin biopsy revealed diffuse neutrophilic dermatitis. Cultures were negative.  Xarelto was held secondary to thrombocytopenia (nadir platelet count 39,000).  He was treated with heparin.  Lower extremity duplex revealed no clot.  He was treated with Cefepime and vancomycin.  He was discharged on Augmentin.  He received oral acyclovir for herpetic labialis.  He was admitted to [facility] from 11/22/2017 - 11/27/2017 for inpatient chemotherapy (cycle #3 TIP).    He was admitted to [facility] from 12/07/2017 - 12/11/2017 with fever and lower extremity edema.  He developed recurrent leg lesions. 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 He was diagnosed with Sweet's syndrome (felt due to [facility]) and began Solumedrol 80 mg IV q day on 12/09/2017.  He fevers quickly resolved.  He received 1 unit of PRBCs.  Platelets became > 50,000 on 12/11/2017 and Xarelto was restarted.  He was admitted to [facility] from 12/27/2017 - 01/01/2018 for inpatient chemotherapy (cycle #4 TIP).  He was admitted to [facility] from 01/11/2018 - 01/13/2018 for pancytopenia. Platelet count was 6,000. He received 1 unit of platelets. Hemoglobin dropped to 7.3 on 01/12/2018. He received 1 units of PRBCs. He was discharged on 01/13/2018 with a hematocrit of 25.7. Hemoglobin 8.8, platelets 24,000, and WBC 7300.     Orders Only on 02/04/2018  Component Date Value Ref Range Status  . Magnesium 02/04/2018 2.0  1.7 - 2.4 mg/dL Final     Performed at [facility], [address] [place], [place] [address]  . LDH 02/04/2018 133  98 - 192 U/L Final   Performed at [facility], [address] [place], [place] [address]  . Sodium 02/04/2018 137  135 - 145 mmol/L Final  . Potassium 02/04/2018 3.9  3.5 - 5.1 mmol/L Final  . Chloride 02/04/2018 105  98 - 111 mmol/L Final  . CO2 02/04/2018 26  22 - 32 mmol/L Final  . Glucose, Bld 02/04/2018 103* 70 - 99 mg/dL Final  . BUN 02/04/2018 14  6 - 20 mg/dL Final  . Creatinine, Ser 02/04/2018 0.76  0.61 - 1.24 mg/dL Final  . Calcium 02/04/2018 10.1  8.9 - 10.3 mg/dL Final  . Total Protein 02/04/2018 7.7  6.5 - 8.1 g/dL Final  . Albumin 02/04/2018 4.7  3.5 - 5.0 g/dL Final  . AST 02/04/2018 16  15 - 41 U/L Final  . ALT 02/04/2018 15  0 - 44 U/L Final  . Alkaline Phosphatase 02/04/2018 48  38 - 126 U/L Final  . Total Bilirubin 02/04/2018 0.5  0.3 - 1.2 mg/dL Final  . GFR calc non Af Amer 02/04/2018 >60  >60 mL/min Final  . GFR calc Af Amer 02/04/2018 >60  >60 mL/min Final   Comment: (NOTE) The eGFR has been calculated using the CKD EPI equation. This calculation has not been validated in all clinical situations. eGFR's persistently <60 mL/min signify possible Chronic Kidney Disease.   Georgiann Hahn gap 02/04/2018 6  5 - 15 Final   Performed at [facility], [address] [place], [place] [address]  . WBC 02/04/2018 7.3  4.0 - 10.5 K/uL Final  . RBC 02/04/2018  3.68* 4.22 - 5.81 MIL/uL Final  . Hemoglobin 02/04/2018 11.2* 13.0 - 17.0 g/dL Final  . HCT 02/04/2018 35.7* 39.0 - 52.0 % Final  . MCV 02/04/2018 97.0  80.0 - 100.0 fL Final  . MCH 02/04/2018 30.4  26.0 - 34.0 pg Final  . MCHC 02/04/2018 31.4  30.0 - 36.0 g/dL Final  . RDW 02/04/2018 14.6  11.5 - 15.5 % Final  . Platelets 02/04/2018 366  150 - 400 K/uL Final  . nRBC 02/04/2018 0.0  0.0 - 0.2 % Final  . Neutrophils Relative % 02/04/2018 73  % Final  . Neutro Abs 02/04/2018 5.2  1.7 - 7.7 K/uL Final  .  Lymphocytes Relative 02/04/2018 15  % Final  . Lymphs Abs 02/04/2018 1.1  0.7 - 4.0 K/uL Final  . Monocytes Relative 02/04/2018 9  % Final  . Monocytes Absolute 02/04/2018 0.7  0.1 - 1.0 K/uL Final  . Eosinophils Relative 02/04/2018 2  % Final  . Eosinophils Absolute 02/04/2018 0.2  0.0 - 0.5 K/uL Final  . Basophils Relative 02/04/2018 1  % Final  . Basophils Absolute 02/04/2018 0.1  0.0 - 0.1 K/uL Final  . Immature Granulocytes 02/04/2018 0  % Final  . Abs Immature Granulocytes 02/04/2018 0.02  0.00 - 0.07 K/uL Final   Performed at [facility], [address] [place], [place] [address]  Hospital Outpatient Visit on 02/03/2018  Component Date Value Ref Range Status  . Glucose-Capillary 02/03/2018 80  70 - 99 mg/dL Final    Assessment:  Edgar [name] is a 27 y.o. male with recurrent testicular cancer.  He presented with a left lower extremity DVT on 09/10/2017.  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  Clinical/pathologic stage is T1 N1-2 S0 M0.  He received 3 cycles of  BEP (12/30/2015 - 02/24/2016).  He received OnPro Neulasta with cycle #2 and #3.  He declined evaluation for retroperitoneal lymph node dissection (RPLND).  He declined sperm banking.  Pre surgery labs on 11/20/2015 revealed an AFP of 411.9, beta-HCG 2,669, and LDH 522 (121-224).    Tumor markers have been followed:   AFP was 411.9 (0-8.3) on 11/20/2015, 11.2 on 12/19/2015, 6.3 on 12/23/2015, 1.3 on 01/27/2016, 1.8 on 02/17/2016, 1.9 on 03/30/2016, 1.4 on 05/27/2016, 1.0 on 07/29/2016, 1.4 on 10/14/2016, 3.3 on 12/24/2016, 1 on 09/10/2017, 1 on 09/20/2017,  2.6 on 10/21/2017, 2.1 on 11/16/2017, 2.1 on 12/20/2017, and 2.1 on 02/04/2018.    Beta-HCG was 2,669 (0-3) on 11/20/2015, 2 on  12/19/2015, 1 on 12/23/2015, < 1 on 01/27/2016, < 1 on 02/17/2016, < 1 on 03/30/2016, < 1 on 05/27/2016,  < 1 on 07/29/2016, < 1 on 10/14/2016, and 3 on 12/24/2016, < 5 on 09/11/2017,  < 5 on 09/20/2017, < 1 on 10/21/2017, < 1 on 11/16/2017, < 1 on 12/20/2017, and < 1 on 02/04/2018.  LDH was 522 (98-192) on 11/20/2015, 179 on 12/19/2015, 160 on 12/23/2015, 202 on 01/27/2016, 156 on 02/10/2016, 161 on 02/17/2016, 172 on 03/30/2016, 178 on 05/27/2016, 150 on 07/29/2016, 152 on 10/14/2016, 145 on 12/24/2016, 1557 on 09/11/2017, 916 on 09/20/2017, 144 on 10/21/2017, 158 on 11/16/2017, 203 on 12/20/2017, 151 on 12/27/2017, and 133 on 02/04/2018.  Chest, abdomen, and pelvic CT on 09/11/2017 revealed enlarged and morphologically abnormal 4.3 x 4.9 cm  left pelvic sidewall and retroperitoneal lymph nodes (measuring up to 2.9 cm).  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  There was anasarca/swelling of the left lower extremity.  There was no evidence of metastatic disease in the chest.  Head MRI on 09/13/2017 revealed no evidence of metastatic disease.   He has a left lower extremity DVT.  Bilateral lower extremity duplex on 09/10/2017 revealed evidence of obstruction in the left CFV, SFJ, and proximal femoral vein.  He is on Xarelto.  He received 4 cycles of TIP chemotherapy (09/21/2017 - 12/27/2017). Cycle #1 was administered at [facility]. Cycles #2 and 3 were administered at [facility].  Both cycles 1 and 2 were complicated by lower extremity skin lesions on recovery of counts. 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  Left calf lesion progressed to pyoderma gangrenosum.  He completed a prednisone taper on 11/x/2019. Patient received daily Zarxio injections from 01/03/2018 - 01/12/2018.  PET scan on 02/03/2018 revealed left common and external iliac adenopathy with only low-grade activity, less than that of the  blood pool. The dominant lymph node is an external iliac node measuring 1.8 x 2.4 cm (previously 2.3 x 3.2 cm). There was no new or enlarging adenopathy.  There was low-grade activity along the left inguinal ring region near the orchectomy site, thought to be postoperative and not due to local malignancy.  Audiogram on 10/27/2017 revealed hearing within normal limits.  He underwent IVC filterplacement, percutaneous angioplastyand stent placementof the left common iliac vein and left external iliac vein on 10/13/2017.  He was discharged on Flagyl, doxycycline, and Levaquin.  Hypercoagulable work-up on 10/12/2017 negative for the following: Factor V Leiden, prothrombin gene mutation, lupus anticoagulant, anticardiolipin antibodies, beta-2 glycoprotein, protein C activity/antigen, protein S activity/antigen. ATIII was 54% (75-120) on heparin.  He has a history of right lower molar abscess s/p extraction on 11/19/2017.  He was treated with amoxicillin.  He stepped on a nail with his right foot.  He was treated with antibiotics for a puncture wound.  He has been followed by podiatry.  Symptomatically,  he is feeling good.  Exam reveals a healing left calf lesion.   Plan: 1. Labs today:  CBC with diff, CMP, Mg, AFP, beta-HCG, LDH. 2. Recurrent testicular cancer Chemotherapy complete- 4 cycles of TIP. PET scan personally reviewed.  Concern for residual disease in lymph nodes. Review plan for possible RPLD based on residual disease noted on imaging. Refer to [facility] for surgical opinion.  Contact Dr. Kalman Shan to coordinate. 3. Chemotherapy-induced pancytopenia  Hemoglobin 11.2, hematocrit 35.7, and platelets 366,000.  Anticipate ongoing improvement following completion of chemotherapy. 4. Sweet's syndrome LEFT lower extremity wound continues to improve with twice daily wound care.   Patient sees [facility] dermatology Marolyn Hammock, MD). 5. LEFT lower extremity DVT Continue anticoagulation  (rivaroxaban). 6. Electrolyte wasting Labs reviewed.  All electrolytes and renal function normal. Continue magnesium and potassium supplementation daily as previously prescribed. Continue routine lab monitoring. 7. Chemotherapy-induced neuropathy Patient has stable neuropathy in his hands and feet. Neuropathy does not impose any functional limitations, nor does it impede his ability to ambulate safely. Patient on gabapentin 300 mg at night. Continue routine monitoring for symptom progression 8. Pain and symptom management Patient has antiemetics and pain medications at home to use on a PRN basis. Patient  advising that the  prescribed interventions are adequate at this point.   Anticipate weaing pain medications to off. Patient  continues to have significant anxiety following his treatment.    Continue clonazepam 0.5 mg twice daily PRN. 9. RTC based on intervention at [facility] for long term follow-up.   Honor Loh, NP  02/04/2018, 10:20 AM   I saw and evaluated the patient, participating in the key portions of the service and reviewing pertinent diagnostic studies and records.  I reviewed the nurse practitioner's note and agree with the findings and the plan.  The assessment and plan were discussed with the patient.  Multiple questions were asked by the patient and answered.   Nolon Stalls, MD 02/04/2018,10:20 AM

## 2018-02-04 NOTE — Progress Notes (Signed)
Patient states left leg is healing slowly. Still having pain 4/10.

## 2018-02-05 LAB — AFP TUMOR MARKER: AFP, Serum, Tumor Marker: 2.1 ng/mL (ref 0.0–8.3)

## 2018-02-05 LAB — BETA HCG QUANT (REF LAB): hCG Quant: <1 m[IU]/mL (ref 0–3)

## 2018-02-08 ENCOUNTER — Telehealth: Payer: Self-pay | Admitting: Hematology and Oncology

## 2018-02-08 NOTE — Telephone Encounter (Signed)
Re:  Multi-disciplinary clinic  After multiple (4) calls to the [facility], I was able to speak to Dr. Tessie Eke about the completion of his chemotherapy and need for assessment in the multi-disciplinary clinic at [facility].  She will be arranging his appointment.  I spoke to [place], his girlfriend.  [facility] should be calling with an appointment time.  If they have not received a call by the Monday after Thanksgiving, 02/14/2018, they should call me.  He should be seen quickly.  Images have been requested to be sent to [facility].   [name], MD

## 2018-02-18 ENCOUNTER — Other Ambulatory Visit: Payer: Self-pay | Admitting: Urgent Care

## 2018-02-18 ENCOUNTER — Encounter: Payer: Self-pay | Admitting: Urgent Care

## 2018-02-18 MED ORDER — XARELTO 20 MG PO TABS: 20.0000 mg | ORAL_TABLET | Freq: Every day | ORAL | 1 refills | Status: DC

## 2018-02-18 MED ORDER — CLONAZEPAM 0.5 MG PO TBDP: 0.5000 mg | ORAL_TABLET | Freq: Two times a day (BID) | ORAL | 0 refills | Status: DC | PRN

## 2018-02-18 MED ORDER — OXYCODONE HCL 10 MG PO TABS: 10.0000 mg | ORAL_TABLET | Freq: Three times a day (TID) | ORAL | 0 refills | Status: DC | PRN

## 2018-02-18 NOTE — Progress Notes (Signed)
Narcotic refill request: 02/18/18  [facility] [place] reviewed prior to consideration of refills:  NARX scores --    Narcotic: 540   Sedative: 461  30 day average MME/day: 30.00  30 day average LME/day: 2.13  ORS score (range 0-999): 590  Providers outside of this practice prescribing narcotics/sedatives in the last 30 days: No  Given a current oncology diagnosis, this patient has the potential to experience significant cancer related pain. Pain well controlled on current regimen. Discuss need to start considering GDR, however patient is potentially facing surgery. Will continue to evaluate need for COT. Benefits versus risks associated with continued therapy considered. Discussed with supervising physician Mike Gip, MD) per collaborative agreement standards. Will continue pain management with opioids as previously prescribed.  Patient educated that medications should not be bitten, chewed, or crushed. Additionally, safety precautions reviewed, as patient is receiving concurrent BZO and opioid therapy He has tolerated this in the past, and denies excessive somnolence. Patient takes medications at different times.   Patient verbalized understanding that medications should not be sold or shared, taken with alcohol, or used while driving. He has been made aware of the side effects of using this medication. Patient understands that this medication can cause CNS depression, increase his risk of falls, and even lead to overdose that may result in death, if used outside of the parameters that he and I discussed.   With all of this in mind, he accepts the risks and responsibilities associated with therapy and elects to continue to use the prescribed interventions.   Refill prescriptions sent in for: 1. Roxicodone 10 mg TID PRN (Disp #30) 2. Clonazepam 0.5 mg ODT BID PRN (Disp #30).   Honor Loh, MSN, APRN, FNP-C, CEN Oncology/Hematology Nurse Practitioner  [facility] 02/18/18 2:22 PM

## 2018-02-21 ENCOUNTER — Other Ambulatory Visit: Payer: Self-pay | Admitting: Urgent Care

## 2018-02-21 DIAGNOSIS — C61 Malignant neoplasm of prostate: Secondary | ICD-10-CM | POA: Diagnosis not present

## 2018-02-21 DIAGNOSIS — Z6835 Body mass index (BMI) 35.0-35.9, adult: Secondary | ICD-10-CM | POA: Diagnosis not present

## 2018-02-21 DIAGNOSIS — C6212 Malignant neoplasm of descended left testis: Secondary | ICD-10-CM | POA: Diagnosis not present

## 2018-02-21 DIAGNOSIS — C6290 Malignant neoplasm of unspecified testis, unspecified whether descended or undescended: Secondary | ICD-10-CM | POA: Diagnosis not present

## 2018-02-24 DIAGNOSIS — L88 Pyoderma gangrenosum: Secondary | ICD-10-CM | POA: Diagnosis not present

## 2018-03-04 ENCOUNTER — Encounter: Payer: Self-pay | Admitting: Urgent Care

## 2018-03-04 ENCOUNTER — Other Ambulatory Visit: Payer: Self-pay | Admitting: Urgent Care

## 2018-03-04 DIAGNOSIS — Z7689 Persons encountering health services in other specified circumstances: Secondary | ICD-10-CM

## 2018-03-04 DIAGNOSIS — F418 Other specified anxiety disorders: Secondary | ICD-10-CM

## 2018-03-04 DIAGNOSIS — C6212 Malignant neoplasm of descended left testis: Secondary | ICD-10-CM

## 2018-03-14 ENCOUNTER — Encounter: Payer: Self-pay | Admitting: Hematology and Oncology

## 2018-03-20 ENCOUNTER — Other Ambulatory Visit: Payer: Self-pay | Admitting: Hematology and Oncology

## 2018-03-20 ENCOUNTER — Encounter: Payer: Self-pay | Admitting: Hematology and Oncology

## 2018-03-21 ENCOUNTER — Telehealth (INDEPENDENT_AMBULATORY_CARE_PROVIDER_SITE_OTHER): Payer: Self-pay

## 2018-03-21 NOTE — Telephone Encounter (Signed)
Spoke with the patient and gave him his pre-procedure instructions as well as his arrival time and day of 03/23/2018 arrive at 9:00 am. Per Edgar [name] the patient is to stop his Xarelto the day before and day of and resume his Xarelto after his procedure.

## 2018-03-22 ENCOUNTER — Other Ambulatory Visit (INDEPENDENT_AMBULATORY_CARE_PROVIDER_SITE_OTHER): Payer: Self-pay | Admitting: Nurse Practitioner

## 2018-03-22 ENCOUNTER — Encounter: Payer: Self-pay | Admitting: Family Medicine

## 2018-03-22 MED ORDER — DEXTROSE 5 % IV SOLN
2.0000 g | Freq: Once | INTRAVENOUS | Status: DC
  Filled 2018-03-22: qty 20

## 2018-03-23 ENCOUNTER — Encounter: Payer: Self-pay | Admitting: Hematology and Oncology

## 2018-03-23 ENCOUNTER — Encounter: Payer: Self-pay | Admitting: Urgent Care

## 2018-03-23 ENCOUNTER — Other Ambulatory Visit: Payer: Self-pay

## 2018-03-23 ENCOUNTER — Ambulatory Visit
Admission: RE | Admit: 2018-03-23 | Discharge: 2018-03-23 | Disposition: A | Discharge status: Home / Self Care | Payer: BLUE CROSS/BLUE SHIELD | Attending: Vascular Surgery | Admitting: Vascular Surgery

## 2018-03-23 ENCOUNTER — Encounter
Admission: RE | Disposition: A | Discharge status: Home / Self Care | Payer: Self-pay | Source: Home / Self Care | Attending: Vascular Surgery

## 2018-03-23 DIAGNOSIS — I82402 Acute embolism and thrombosis of unspecified deep veins of left lower extremity: Secondary | ICD-10-CM | POA: Diagnosis not present

## 2018-03-23 DIAGNOSIS — Z800 Family history of malignant neoplasm of digestive organs: Secondary | ICD-10-CM | POA: Diagnosis not present

## 2018-03-23 DIAGNOSIS — Z95828 Presence of other vascular implants and grafts: Secondary | ICD-10-CM | POA: Diagnosis not present

## 2018-03-23 DIAGNOSIS — Z823 Family history of stroke: Secondary | ICD-10-CM | POA: Insufficient documentation

## 2018-03-23 DIAGNOSIS — C6290 Malignant neoplasm of unspecified testis, unspecified whether descended or undescended: Secondary | ICD-10-CM | POA: Diagnosis not present

## 2018-03-23 DIAGNOSIS — I890 Lymphedema, not elsewhere classified: Secondary | ICD-10-CM | POA: Insufficient documentation

## 2018-03-23 DIAGNOSIS — Z808 Family history of malignant neoplasm of other organs or systems: Secondary | ICD-10-CM | POA: Insufficient documentation

## 2018-03-23 DIAGNOSIS — K219 Gastro-esophageal reflux disease without esophagitis: Secondary | ICD-10-CM | POA: Diagnosis not present

## 2018-03-23 DIAGNOSIS — Z452 Encounter for adjustment and management of vascular access device: Secondary | ICD-10-CM | POA: Diagnosis not present

## 2018-03-23 DIAGNOSIS — I82409 Acute embolism and thrombosis of unspecified deep veins of unspecified lower extremity: Secondary | ICD-10-CM

## 2018-03-23 DIAGNOSIS — Z7901 Long term (current) use of anticoagulants: Secondary | ICD-10-CM | POA: Diagnosis not present

## 2018-03-23 DIAGNOSIS — Z803 Family history of malignant neoplasm of breast: Secondary | ICD-10-CM | POA: Diagnosis not present

## 2018-03-23 DIAGNOSIS — C799 Secondary malignant neoplasm of unspecified site: Secondary | ICD-10-CM | POA: Diagnosis not present

## 2018-03-23 DIAGNOSIS — F17210 Nicotine dependence, cigarettes, uncomplicated: Secondary | ICD-10-CM | POA: Insufficient documentation

## 2018-03-23 HISTORY — PX: IVC FILTER REMOVAL: CATH118246

## 2018-03-23 SURGERY — IVC FILTER REMOVAL
Anesthesia: Moderate Sedation

## 2018-03-23 MED ORDER — FENTANYL CITRATE (PF) 100 MCG/2ML IJ SOLN
INTRAMUSCULAR | Status: AC
  Filled 2018-03-23: qty 2

## 2018-03-23 MED ORDER — CEFAZOLIN SODIUM-DEXTROSE 2-4 GM/100ML-% IV SOLN
INTRAVENOUS | Status: AC
  Filled 2018-03-23: qty 100

## 2018-03-23 MED ORDER — FENTANYL CITRATE (PF) 100 MCG/2ML IJ SOLN
INTRAMUSCULAR | Status: DC | PRN
  Administered 2018-03-23 (×4): 50 ug via INTRAVENOUS

## 2018-03-23 MED ORDER — HEPARIN (PORCINE) IN NACL 1000-0.9 UT/500ML-% IV SOLN
INTRAVENOUS | Status: AC
  Filled 2018-03-23: qty 500

## 2018-03-23 MED ORDER — MIDAZOLAM HCL 5 MG/5ML IJ SOLN
INTRAMUSCULAR | Status: AC
  Filled 2018-03-23: qty 5

## 2018-03-23 MED ORDER — IOPAMIDOL (ISOVUE-300) INJECTION 61%
INTRAVENOUS | Status: DC | PRN
  Administered 2018-03-23: 15 mL via INTRAVENOUS

## 2018-03-23 MED ORDER — LIDOCAINE-EPINEPHRINE (PF) 1 %-1:200000 IJ SOLN
INTRAMUSCULAR | Status: AC
  Filled 2018-03-23: qty 10

## 2018-03-23 MED ORDER — SODIUM CHLORIDE 0.9 % IV SOLN: INTRAVENOUS | Status: DC

## 2018-03-23 MED ORDER — HEPARIN SOD (PORK) LOCK FLUSH 100 UNIT/ML IV SOLN
INTRAVENOUS | Status: AC
  Filled 2018-03-23: qty 5

## 2018-03-23 MED ORDER — MIDAZOLAM HCL 2 MG/2ML IJ SOLN
INTRAMUSCULAR | Status: DC | PRN
  Administered 2018-03-23: 1 mg via INTRAVENOUS
  Administered 2018-03-23: 2 mg via INTRAVENOUS
  Administered 2018-03-23 (×2): 1 mg via INTRAVENOUS

## 2018-03-23 SURGICAL SUPPLY — 9 items
CATH BEACON 5 .035 40 KMP TP (CATHETERS) IMPLANT
CATH BEACON 5 .038 40 KMP TP (CATHETERS) ×1
GLIDEWIRE ADV .035X180CM (WIRE) ×1 IMPLANT
NDL ENTRY 21GA 7CM ECHOTIP (NEEDLE) IMPLANT
NEEDLE ENTRY 21GA 7CM ECHOTIP (NEEDLE) ×2 IMPLANT
PACK ANGIOGRAPHY (CUSTOM PROCEDURE TRAY) ×1 IMPLANT
SET INTRO CAPELLA COAXIAL (SET/KITS/TRAYS/PACK) ×1 IMPLANT
SET VENACAVA FILTER RETRIEVAL (MISCELLANEOUS) ×1 IMPLANT
WIRE J 3MM .035X145CM (WIRE) ×1 IMPLANT

## 2018-03-23 NOTE — H&P (Signed)
[facility] Admission History & Physical  MRN : [id number]  Edgar [name] is a 28 y.o. (May 08, 1990) male who presents with chief complaint of need to have my filter out.  History of Present Illness:   Patient is sent back by [facility] for filter removal prior to surgery.  The patient is being evaluated at [facility] and there is consideration for a debulking surgery.  The surgeon has requested that his filter be removed.  Filter was placed October 13, 2017.  At that time an iliac stent was also placed.  The stent cannot be removed but the [facility] surgeons do not feel that this would be prohibitive for his debulking surgery.  The patient has continued his treatment for metastatic testicular cancer.  At this time the patient denies fever chills.  His pain is at its baseline.  His swelling and lymphedema is also at its baseline.  Current Facility-Administered Medications  Medication Dose Route Frequency Provider Last Rate Last Dose  . 0.9 %  sodium chloride infusion   Intravenous Continuous Kris Hartmann, NP       Facility-Administered Medications Ordered in Other Encounters  Medication Dose Route Frequency Provider Last Rate Last Dose  . filgrastim-sndz (ZARXIO) injection 480 mcg  480 mcg Subcutaneous Once [name], MD      . Karle Barr [facility]) injection 480 mcg  480 mcg Subcutaneous Once [name], MD        Past Medical History:  Diagnosis Date  . Asthma    AS A CHILD-NO INHALERS  . Cancer ([place])    testicular cancer 11/2016 per pt   . DVT (deep venous thrombosis) ([place])   . GERD (gastroesophageal reflux disease)    TUMS PRN    Past Surgical History:  Procedure Laterality Date  . ANKLE FRACTURE SURGERY Right 2004  . FINE NEEDLE ASPIRATION BIOPSY N/A 11/09/2017   Procedure: FINE NEEDLE ASPIRATION BIOPSY;  Surgeon: [name], MD;  Location: [place] [facility];  Service: Cardiovascular;  Laterality: N/A;  . IVC FILTER INSERTION    .  ORCHIECTOMY Left 11/26/2015   Procedure: ORCHIECTOMY;  Surgeon: [name], MD;  Location: [facility] ORS;  Service: Urology;  Laterality: Left;  radical/Inguinal approach  . PERIPHERAL VASCULAR CATHETERIZATION N/A 12/16/2015   Procedure: [name] Cath Insertion;  Surgeon: [name], MD;  Location: [place] [facility];  Service: Cardiovascular;  Laterality: N/A;  . PERIPHERAL VASCULAR THROMBECTOMY Left 10/13/2017   Procedure: PERIPHERAL VASCULAR THROMBECTOMY;  Surgeon: [name], MD;  Location: [place] [facility];  Service: Cardiovascular;  Laterality: Left;  . WISDOM TOOTH EXTRACTION  2017    Social History Social History   Tobacco Use  . Smoking status: Current Every Day Smoker    Packs/day: 0.50    Years: 8.00    Pack years: 4.00    Types: Cigarettes  . Smokeless tobacco: Former Systems developer    Types: Snuff  Substance Use Topics  . Alcohol use: No  . Drug use: Not Currently    Types: Marijuana    Family History Family History  Problem Relation Age of Onset  . Skin cancer Mother   . Breast cancer Maternal Grandmother   . Colon cancer Maternal Grandfather   . Diabetes Maternal Grandfather   . Emphysema Father   . Mental illness Sister        anxiety  . Stroke Paternal Grandmother   . Prostate cancer Neg Hx   . Kidney cancer Neg Hx   .  Bladder Cancer Neg Hx   No family history of bleeding/clotting disorders, porphyria or autoimmune disease   No Known Allergies   REVIEW OF SYSTEMS (Negative unless checked)  Constitutional: [] Weight loss  [] Fever  [] Chills Cardiac: [] Chest pain   [] Chest pressure   [] Palpitations   [] Shortness of breath when laying flat   [] Shortness of breath at rest   [] Shortness of breath with exertion. Vascular:  [] Pain in legs with walking   [] Pain in legs at rest   [] Pain in legs when laying flat   [] Claudication   [] Pain in feet when walking  [] Pain in feet at rest  [] Pain in feet when laying flat   [] History of DVT   [] Phlebitis   [x] Swelling in  legs   [] Varicose veins   [] Non-healing ulcers Pulmonary:   [] Uses home oxygen   [] Productive cough   [] Hemoptysis   [] Wheeze  [] COPD   [] Asthma Neurologic:  [] Dizziness  [] Blackouts   [] Seizures   [] History of stroke   [] History of TIA  [] Aphasia   [] Temporary blindness   [] Dysphagia   [] Weakness or numbness in arms   [] Weakness or numbness in legs Musculoskeletal:  [] Arthritis   [] Joint swelling   [] Joint pain   [x] Low back pain Hematologic:  [x] Easy bruising  [] Easy bleeding   [] Hypercoagulable state   [] Anemic  [] Hepatitis Gastrointestinal:  [] Blood in stool   [] Vomiting blood  [] Gastroesophageal reflux/heartburn   [] Difficulty swallowing. Genitourinary:  [] Chronic kidney disease   [] Difficult urination  [] Frequent urination  [] Burning with urination   [] Blood in urine Skin:  [] Rashes   [] Ulcers   [] Wounds Psychological:  [] History of anxiety   []  History of major depression.  Physical Examination  Vitals:   03/23/18 0928  BP: (!) 145/101  Pulse: (!) 112  Resp: 17  Temp: 98.1 F (36.7 C)  TempSrc: Oral  SpO2: 99%  Weight: 115.7 kg  Height: 6\' 3"  (1.905 m)   Body mass index is 31.87 kg/m. Gen: WD/WN, NAD Head: [place]/AT, No temporalis wasting.  Ear/Nose/Throat: Hearing grossly intact, nares w/o erythema or drainage, oropharynx w/o Erythema/Exudate, Eyes: Sclera non-icteric, conjunctiva clear Neck: Supple, no nuchal rigidity.  No JVD.  Pulmonary:  Good air movement, no increased work of respiration or use of accessory muscles  Cardiac: RRR, normal S1, S2, no Murmurs, rubs or gallops. Vascular: scattered varicosities present bilaterally.  Mild venous stasis changes to the legs bilaterally.  3-4+ soft pitting edema Vessel Right Left  Radial Palpable Palpable  PT Palpable Palpable  DP Palpable Palpable  Gastrointestinal: soft, non-tender/non-distended. No guarding/reflex. No masses, surgical incisions, or scars. Musculoskeletal: M/S 5/5 throughout.  No deformity or atrophy.  3-4+  edema Neurologic: Sensation grossly intact in extremities.  Symmetrical.  Speech is fluent. Motor exam as listed above. Psychiatric: Judgment intact, Mood & affect appropriate for pt's clinical situation. Dermatologic: No rashes or ulcers noted.  No cellulitis or open wounds. Lymph : No Cervical, Axillary, or Inguinal lymphadenopathy.     CBC Lab Results  Component Value Date   WBC 7.3 02/04/2018   HGB 11.2 (L) 02/04/2018   HCT 35.7 (L) 02/04/2018   MCV 97.0 02/04/2018   PLT 366 02/04/2018    BMET    Component Value Date/Time   NA 137 02/04/2018 0910   K 3.9 02/04/2018 0910   CL 105 02/04/2018 0910   CO2 26 02/04/2018 0910   GLUCOSE 103 (H) 02/04/2018 0910   BUN 14 02/04/2018 0910   CREATININE 0.76 02/04/2018 0910   CALCIUM  10.1 02/04/2018 0910   GFRNONAA >60 02/04/2018 0910   GFRAA >60 02/04/2018 0910   CrCl cannot be calculated (Patient's most recent lab result is older than the maximum 21 days allowed.).  COAG Lab Results  Component Value Date   INR 1.11 12/08/2017   INR 1.13 11/07/2017   INR 1.65 11/06/2017    Radiology No results found.    Assessment/Plan 1. Left lower extremity DVT status post intervention associated with metastatic testicular cancer:  The patient will continue anticoagulation for now as there have not been any problems or complications at this point.  He has been on anticoagulation for many months now and therefore his IVC filter can be safely removed in preparation for his surgery.    IVC filter will be removed. Risk and benefits were reviewed the patient.  Indications for the procedure were reviewed.  All questions were answered, the patient agrees to proceed.   I have had a long discussion with the patient regarding DVT and post phlebitic changes such as swelling and why it  causes symptoms such as pain.  The patient will wear graduated compression stockings class 1 (20-30 mmHg) on a daily basis a prescription was given. The patient  will  beginning wearing the stockings first thing in the morning and removing them in the evening. The patient is instructed specifically not to sleep in the stockings.  In addition, behavioral modification including elevation during the day and avoidance of prolonged dependency will be initiated.    The patient will follow-up with me in in 2 weeks  2.  Metastatic testicular cancer:  Plan per the cancer center and [facility].  Filter will be removed in preparation for surgery.    Hortencia Pilar, MD  03/23/2018 9:57 AM

## 2018-03-23 NOTE — Op Note (Signed)
  OPERATIVE NOTE   PRE-OPERATIVE DIAGNOSIS: DVT   POST-OPERATIVE DIAGNOSIS: Same  PROCEDURE: 1. Retrieval of IVC Filter 2. Inferior Vena Cavagram  SURGEON: [name], M.D.  ANESTHESIA:  Conscious sedation was administered under my direct supervision by the interventional radiology RN. IV Versed plus fentanyl were utilized. Continuous ECG, pulse oximetry and blood pressure was monitored throughout the entire procedure. Conscious sedation was for a total of 40 minutes.  ESTIMATED BLOOD LOSS: Minimal cc  FINDING(S):inferior vena cava is widely patent filter is in place in good position. Filter is removed without incident  SPECIMEN(S):  IVC filter intact  INDICATIONS:   Edgar [name] is a 28 y.o. male who presents with DVT and PE. The patient has now tolerated anticoagulation for several months. Therefore, the IVC filter is recommended to be removed. The risks and benefits were reviewed with the patient all questions were answered and they agreed to proceed with IVC filter retrieval. Oral anticoagulation will be continued.  DESCRIPTION: After obtaining full informed written consent, the patient was brought back to the Special Procedure Suite and placed in the supine position.  The patient received IV antibiotics prior to induction.  After obtaining adequate sedation, the patient was prepped and draped in the standard fashion and appropriate time out is called.     Ultrasound was placed in a sterile sleeve.The left neck was then imaged with ultrasound.  The left jugular vein was identified it is echolucent and homogeneous indicating patency. 1% lidocaine is then infiltrated under ultrasound visualization and subsequently a Seldinger needle is inserted under real-time ultrasound guidance.  J-wire is then advanced into the inferior vena cava under fluoroscopic guidance. With the tip of the sheath at the confluence of the iliac veins inferior vena caval imaging is performed.  After  review of the image the sheath is repositioned to above the filter and the snares introduced. Snares opened and the hook is secured without difficulty. The filter is then collapsed within the sheath and removed without difficulty.  Sheath is removed by pressures held the patient tolerated the procedure well and there were no immediate complications.  Interpretation: inferior vena cava is widely patent filter is in place in good position. Filter is removed without incident.     COMPLICATIONS: None  CONDITION: [name], M.D. [facility] Office: [phone]   03/23/2018, 12:30 PM

## 2018-03-24 ENCOUNTER — Ambulatory Visit (INDEPENDENT_AMBULATORY_CARE_PROVIDER_SITE_OTHER): Payer: Medicaid Other | Admitting: Vascular Surgery

## 2018-03-29 ENCOUNTER — Telehealth (INDEPENDENT_AMBULATORY_CARE_PROVIDER_SITE_OTHER): Payer: Self-pay

## 2018-03-29 NOTE — Telephone Encounter (Signed)
[name] from [facility] called and left a message on the triage line and stated that the had some questions about the patients iliac stent and lower extremity clot if still present. She stated she seen where he had the filter removed with this office last week and they had him appointment in [facility] to see their vascular doctors. They would like someone to call them to answer the two questions above and they have more they would like to add.   Call back [phone]

## 2018-03-29 NOTE — Telephone Encounter (Signed)
I called but there was no answer.  Left a message for her to call us back at her convenience.

## 2018-03-30 NOTE — Telephone Encounter (Signed)
[name] returned you call and stated to call her back at you convenience [phone]

## 2018-03-31 NOTE — Telephone Encounter (Signed)
I attempted to call her back again.  If she calls again, can you get all of the questions that she has so that I can perhaps send a message that answers them.  Thanks!

## 2018-03-31 NOTE — Telephone Encounter (Signed)
I called to try and speak with her and get her questions and no answer

## 2018-04-03 ENCOUNTER — Other Ambulatory Visit: Payer: Self-pay | Admitting: Urgent Care

## 2018-04-05 ENCOUNTER — Encounter (INDEPENDENT_AMBULATORY_CARE_PROVIDER_SITE_OTHER): Payer: Self-pay

## 2018-04-06 ENCOUNTER — Telehealth (INDEPENDENT_AMBULATORY_CARE_PROVIDER_SITE_OTHER): Payer: Self-pay

## 2018-04-06 NOTE — Telephone Encounter (Signed)
Attempted to contact the patient to reschedule [name] but had to leave a message for a return call.   Spoke with the patients mother earlier in the week to schedule [name]. Patient was scheduled for 04/12/2018 and information was mailed out to the patient. This all came about due to [name] from [facility] calling stating the patient needed to have an IVC filter placed before his surgery in February and that I should contact the patient's mother which I did.The patient's mother is authorized to receive information regarding the patient and to speak with. I received a message from [name] [facility] ) stating the patient wanted to reschedule but did not want me to speak with his mother but to call him.

## 2018-04-07 ENCOUNTER — Telehealth (INDEPENDENT_AMBULATORY_CARE_PROVIDER_SITE_OTHER): Payer: Self-pay

## 2018-04-07 NOTE — Telephone Encounter (Signed)
Patient returned my call and wants to keep the appt on 04/12/2018.

## 2018-04-07 NOTE — Telephone Encounter (Signed)
I attempted to contact the patient to reschedule his procedure that is scheduled for 04/12/2018, a message was left for a return call.

## 2018-04-11 ENCOUNTER — Encounter: Payer: BLUE CROSS/BLUE SHIELD | Admitting: Family Medicine

## 2018-04-11 ENCOUNTER — Other Ambulatory Visit (INDEPENDENT_AMBULATORY_CARE_PROVIDER_SITE_OTHER): Payer: Self-pay | Admitting: Nurse Practitioner

## 2018-04-11 MED ORDER — DEXTROSE 5 % IV SOLN
2.0000 g | Freq: Once | INTRAVENOUS | Status: DC
  Filled 2018-04-11: qty 20

## 2018-04-12 ENCOUNTER — Encounter
Admission: RE | Disposition: A | Discharge status: Home / Self Care | Payer: Self-pay | Source: Home / Self Care | Attending: Vascular Surgery

## 2018-04-12 ENCOUNTER — Ambulatory Visit
Admission: RE | Admit: 2018-04-12 | Discharge: 2018-04-12 | Disposition: A | Discharge status: Home / Self Care | Payer: BLUE CROSS/BLUE SHIELD | Attending: Vascular Surgery | Admitting: Vascular Surgery

## 2018-04-12 ENCOUNTER — Other Ambulatory Visit: Payer: Self-pay

## 2018-04-12 DIAGNOSIS — I2699 Other pulmonary embolism without acute cor pulmonale: Secondary | ICD-10-CM | POA: Diagnosis not present

## 2018-04-12 DIAGNOSIS — Z7901 Long term (current) use of anticoagulants: Secondary | ICD-10-CM | POA: Diagnosis not present

## 2018-04-12 DIAGNOSIS — F17210 Nicotine dependence, cigarettes, uncomplicated: Secondary | ICD-10-CM | POA: Diagnosis not present

## 2018-04-12 DIAGNOSIS — I82402 Acute embolism and thrombosis of unspecified deep veins of left lower extremity: Secondary | ICD-10-CM | POA: Insufficient documentation

## 2018-04-12 DIAGNOSIS — C6290 Malignant neoplasm of unspecified testis, unspecified whether descended or undescended: Secondary | ICD-10-CM | POA: Insufficient documentation

## 2018-04-12 DIAGNOSIS — I82409 Acute embolism and thrombosis of unspecified deep veins of unspecified lower extremity: Secondary | ICD-10-CM | POA: Diagnosis not present

## 2018-04-12 HISTORY — PX: IVC FILTER INSERTION: CATH118245

## 2018-04-12 SURGERY — IVC FILTER INSERTION
Anesthesia: Moderate Sedation

## 2018-04-12 MED ORDER — FENTANYL CITRATE (PF) 100 MCG/2ML IJ SOLN
INTRAMUSCULAR | Status: AC
  Filled 2018-04-12: qty 2

## 2018-04-12 MED ORDER — LIDOCAINE HCL (PF) 1 % IJ SOLN
INTRAMUSCULAR | Status: AC
  Filled 2018-04-12: qty 30

## 2018-04-12 MED ORDER — FENTANYL CITRATE (PF) 100 MCG/2ML IJ SOLN
INTRAMUSCULAR | Status: DC | PRN
  Administered 2018-04-12 (×3): 50 ug via INTRAVENOUS

## 2018-04-12 MED ORDER — HYDROMORPHONE HCL 1 MG/ML IJ SOLN: 1.0000 mg | Freq: Once | INTRAMUSCULAR | Status: DC | PRN

## 2018-04-12 MED ORDER — MIDAZOLAM HCL 2 MG/ML PO SYRP: 8.0000 mg | ORAL_SOLUTION | Freq: Once | ORAL | Status: DC | PRN

## 2018-04-12 MED ORDER — IOPAMIDOL (ISOVUE-300) INJECTION 61%
INTRAVENOUS | Status: DC | PRN
  Administered 2018-04-12: 15 mL via INTRAVENOUS

## 2018-04-12 MED ORDER — MIDAZOLAM HCL 2 MG/2ML IJ SOLN
INTRAMUSCULAR | Status: DC | PRN
  Administered 2018-04-12 (×2): 1 mg via INTRAVENOUS
  Administered 2018-04-12: 2 mg via INTRAVENOUS

## 2018-04-12 MED ORDER — DIPHENHYDRAMINE HCL 50 MG/ML IJ SOLN: 50.0000 mg | Freq: Once | INTRAMUSCULAR | Status: DC | PRN

## 2018-04-12 MED ORDER — METHYLPREDNISOLONE SODIUM SUCC 125 MG IJ SOLR: 125.0000 mg | Freq: Once | INTRAMUSCULAR | Status: DC | PRN

## 2018-04-12 MED ORDER — MIDAZOLAM HCL 5 MG/5ML IJ SOLN
INTRAMUSCULAR | Status: AC
  Filled 2018-04-12: qty 5

## 2018-04-12 MED ORDER — HEPARIN (PORCINE) IN NACL 1000-0.9 UT/500ML-% IV SOLN
INTRAVENOUS | Status: AC
  Filled 2018-04-12: qty 500

## 2018-04-12 MED ORDER — FAMOTIDINE 20 MG PO TABS: 40.0000 mg | ORAL_TABLET | Freq: Once | ORAL | Status: DC | PRN

## 2018-04-12 MED ORDER — ONDANSETRON HCL 4 MG/2ML IJ SOLN: 4.0000 mg | Freq: Four times a day (QID) | INTRAMUSCULAR | Status: DC | PRN

## 2018-04-12 MED ORDER — SODIUM CHLORIDE 0.9 % IV SOLN
INTRAVENOUS | Status: DC
  Administered 2018-04-12: 12:00:00 via INTRAVENOUS

## 2018-04-12 SURGICAL SUPPLY — 5 items
KIT FEMORAL DEL DENALI (Miscellaneous) ×2 IMPLANT
NEEDLE ENTRY 21GA 7CM ECHOTIP (NEEDLE) ×2 IMPLANT
PACK ANGIOGRAPHY (CUSTOM PROCEDURE TRAY) ×2 IMPLANT
SET INTRO CAPELLA COAXIAL (SET/KITS/TRAYS/PACK) ×2 IMPLANT
WIRE J 3MM .035X145CM (WIRE) ×2 IMPLANT

## 2018-04-12 NOTE — Op Note (Signed)
[facility]   OPERATIVE NOTE    PRE-OPERATIVE DIAGNOSIS: DVT with PE  POST-OPERATIVE DIAGNOSIS: Same  PROCEDURE: 1.   Ultrasound guidance for vascular access to the right common femoral vein 2.   Catheter placement into the inferior vena cava 3.   Inferior venacavogram 4.   Placement of a [place] IVC filter  SURGEON: Hortencia Pilar  ASSISTANT(S): None  ANESTHESIA: Conscious sedation was administered by the interventional radiology RN under my direct supervision. IV Versed plus fentanyl were utilized. Continuous ECG, pulse oximetry and blood pressure was monitored throughout the entire procedure. Conscious sedation was for a total of 20 minutes.  ESTIMATED BLOOD LOSS: minimal  FINDING(S): 1.  Patent IVC  SPECIMEN(S):  none  INDICATIONS:   Edgar [name] is a 28 y.o. y.o. male who presents with history of extensive DVT soon to require surgery.  Inferior vena cava filter is indicated for this reason.  Risks and benefits including filter thrombosis, migration, fracture, bleeding, and infection were all discussed.  We discussed that all IVC filters that we place can be removed if desired from the patient once the need for the filter has passed.    DESCRIPTION: After obtaining full informed written consent, the patient was brought back to the vascular suite. The skin was sterilely prepped and draped in a sterile surgical field was created. Ultrasound was placed in a sterile sleeve. The right common femoral was echolucent and compressible indicating patency. Image was recorded for the permanent record. The puncture was made under continuous real-time ultrasound guidance.  The right common femoral was accessed under direct ultrasound guidance without difficulty with a micropuncture needle. Microwire was then advanced under fluoroscopic guidance without difficulty. Micro-sheath was then inserted and a J-wire was then placed. The dilator is passed over the wire and  the delivery sheath was placed into the inferior vena cava.  Inferior venacavogram was performed. This demonstrated a patent IVC with the level of the renal veins at L2.  The filter was then deployed into the inferior vena cava at the level of L3 just below the renal veins. The delivery sheath was then removed. Pressure was held. Sterile dressings were placed. The patient tolerated the procedure well and was taken to the recovery room in stable condition.  Interpretation: IVC is patent and measures 22 mm in diameter.  Filter at L3 in upright position  COMPLICATIONS: None  CONDITION: Stable  Hortencia Pilar  04/12/2018, 3:32 PM

## 2018-04-13 ENCOUNTER — Encounter: Payer: Self-pay | Admitting: Vascular Surgery

## 2018-04-29 DIAGNOSIS — Z01818 Encounter for other preprocedural examination: Secondary | ICD-10-CM | POA: Diagnosis not present

## 2018-04-29 DIAGNOSIS — Z79899 Other long term (current) drug therapy: Secondary | ICD-10-CM | POA: Diagnosis not present

## 2018-04-29 DIAGNOSIS — C6212 Malignant neoplasm of descended left testis: Secondary | ICD-10-CM | POA: Diagnosis not present

## 2018-04-29 DIAGNOSIS — N390 Urinary tract infection, site not specified: Secondary | ICD-10-CM | POA: Diagnosis not present

## 2018-04-29 DIAGNOSIS — Z7901 Long term (current) use of anticoagulants: Secondary | ICD-10-CM | POA: Diagnosis not present

## 2018-04-29 DIAGNOSIS — Z86718 Personal history of other venous thrombosis and embolism: Secondary | ICD-10-CM | POA: Diagnosis not present

## 2018-04-29 DIAGNOSIS — Z9221 Personal history of antineoplastic chemotherapy: Secondary | ICD-10-CM | POA: Diagnosis not present

## 2018-05-10 ENCOUNTER — Telehealth (INDEPENDENT_AMBULATORY_CARE_PROVIDER_SITE_OTHER): Payer: Self-pay | Admitting: Vascular Surgery

## 2018-05-10 NOTE — Telephone Encounter (Signed)
Mickey Farber Navigator from [facility] calling to discuss patient.  I attempted to return her call and left message with my name and number so she could return mine. AS,CMA

## 2018-05-11 ENCOUNTER — Telehealth (INDEPENDENT_AMBULATORY_CARE_PROVIDER_SITE_OTHER): Payer: Self-pay | Admitting: Vascular Surgery

## 2018-05-11 NOTE — Telephone Encounter (Signed)
[name] Nurse Navigator stating that [employer] suggest patient D/C Xarelto 7 days before procedure and starting with a low molecular weight heparin or Lovenox. Asking if [name] with write RX for patient. Also asking if so do we teach patient how to use Lovenox.   Spoke with [name], she suggested that I speak to [employer] when he returns to the office on 05/11/2018. [name] is aware that I will speak with him and return her call tomorrow.   Patient scheduled for surgery at [facility] on 05/19/18. AS, CMA

## 2018-05-12 ENCOUNTER — Other Ambulatory Visit (INDEPENDENT_AMBULATORY_CARE_PROVIDER_SITE_OTHER): Payer: Self-pay | Admitting: Vascular Surgery

## 2018-05-12 MED ORDER — ENOXAPARIN SODIUM 30 MG/0.3ML ~~LOC~~ SOLN: 100.0000 mg | Freq: Two times a day (BID) | SUBCUTANEOUS | 0 refills | Status: DC

## 2018-05-12 NOTE — Telephone Encounter (Signed)
Spoke with Patient, he is aware to D/C Xarelto and start Lovenox today. Verbalized understanding and said he was going to go get RX. He has not taken his Xarelto today. AS, CMA

## 2018-05-12 NOTE — Telephone Encounter (Signed)
Spoke with Dr. Delana Meyer. He said he would prescribe the Lovenox for patient. Need to find out if the pharmacy will teach patient how to use this medication...if not, try the cancer center at [facility] since pt is already established there.

## 2018-05-12 NOTE — Telephone Encounter (Signed)
I spoke with Dr. Lucky Cowboy this morning, he said he would write script for Lovenox 100mg  per syringe inject into skin BID.  Found pharmacy that will fill script and teach patient how to use medication [facility]) Medication called in over the phone.  I have called [name] the Nurse Navigator at [facility] and she is aware of this.   I have called [facility] several times today (atleast 6) with no answers. I leave a voicemail each time requesting the patient to return my call.  He needed to DC Xarelto today and start Lovenox.

## 2018-05-13 ENCOUNTER — Ambulatory Visit (INDEPENDENT_AMBULATORY_CARE_PROVIDER_SITE_OTHER): Payer: Medicaid Other | Admitting: Nurse Practitioner

## 2018-05-19 DIAGNOSIS — Z9221 Personal history of antineoplastic chemotherapy: Secondary | ICD-10-CM | POA: Diagnosis not present

## 2018-05-19 DIAGNOSIS — C6290 Malignant neoplasm of unspecified testis, unspecified whether descended or undescended: Secondary | ICD-10-CM | POA: Diagnosis not present

## 2018-05-19 DIAGNOSIS — I82509 Chronic embolism and thrombosis of unspecified deep veins of unspecified lower extremity: Secondary | ICD-10-CM | POA: Diagnosis not present

## 2018-05-19 DIAGNOSIS — C6212 Malignant neoplasm of descended left testis: Secondary | ICD-10-CM | POA: Diagnosis not present

## 2018-05-21 DIAGNOSIS — C6290 Malignant neoplasm of unspecified testis, unspecified whether descended or undescended: Secondary | ICD-10-CM | POA: Diagnosis not present

## 2018-05-21 MED ORDER — INFLUENZA VAC SPLIT QUAD 0.5 ML IM SUSY
.50 | PREFILLED_SYRINGE | INTRAMUSCULAR | Status: DC
Start: ? — End: 2018-05-21

## 2018-05-21 MED ORDER — MORPHINE SULFATE 2 MG/ML IJ SOLN
2.00 | INTRAMUSCULAR | Status: DC
Start: ? — End: 2018-05-21

## 2018-05-21 MED ORDER — DOCUSATE SODIUM 100 MG PO CAPS
200.00 | ORAL_CAPSULE | ORAL | Status: DC
Start: 2018-05-21 — End: 2018-05-21

## 2018-05-21 MED ORDER — MENTHOL 9.1 MG MT LOZG
1.00 | LOZENGE | OROMUCOSAL | Status: DC
Start: ? — End: 2018-05-21

## 2018-05-21 MED ORDER — ACETAMINOPHEN 500 MG PO TABS
1000.00 | ORAL_TABLET | ORAL | Status: DC
Start: 2018-05-21 — End: 2018-05-21

## 2018-05-21 MED ORDER — GENERIC EXTERNAL MEDICATION
5.00 | Status: DC
Start: ? — End: 2018-05-21

## 2018-05-21 MED ORDER — ONDANSETRON 4 MG PO TBDP
4.00 | ORAL_TABLET | ORAL | Status: DC
Start: ? — End: 2018-05-21

## 2018-05-21 MED ORDER — GENERIC EXTERNAL MEDICATION
2.00 | Status: DC
Start: 2018-05-21 — End: 2018-05-21

## 2018-07-14 ENCOUNTER — Inpatient Hospital Stay: Payer: Medicaid Other | Attending: Hematology and Oncology

## 2018-08-10 ENCOUNTER — Telehealth: Payer: Self-pay | Admitting: *Deleted

## 2018-08-10 NOTE — Telephone Encounter (Signed)
Request received from [facility] in [place] for refill of Gabapentin 300 mg every hs. This is not on his medicine list. Please advise

## 2018-08-10 NOTE — Telephone Encounter (Signed)
I called [facility] and informed them it is no longer on hismed list and that we have not seen him in 6 months and to please contact Dr Wynetta Emery for this request

## 2018-08-10 NOTE — Telephone Encounter (Signed)
  We have not seen him in 6 months.  I feel that his gabapentin should be filled by whoever is following him now.  M

## 2018-09-04 ENCOUNTER — Other Ambulatory Visit: Payer: Self-pay | Admitting: Family Medicine

## 2018-09-05 ENCOUNTER — Telehealth (INDEPENDENT_AMBULATORY_CARE_PROVIDER_SITE_OTHER): Payer: Self-pay

## 2018-09-05 ENCOUNTER — Other Ambulatory Visit: Payer: Self-pay

## 2018-09-05 ENCOUNTER — Encounter: Payer: Self-pay | Admitting: Hematology and Oncology

## 2018-09-05 ENCOUNTER — Telehealth: Payer: Self-pay | Admitting: Oncology

## 2018-09-05 ENCOUNTER — Telehealth: Payer: Self-pay

## 2018-09-05 DIAGNOSIS — Z86718 Personal history of other venous thrombosis and embolism: Secondary | ICD-10-CM

## 2018-09-05 DIAGNOSIS — I82412 Acute embolism and thrombosis of left femoral vein: Secondary | ICD-10-CM

## 2018-09-05 NOTE — Telephone Encounter (Signed)
Unfortunately, because this was originally prescribed by his cancer doctor, it needs to continue to be prescribed by their office as they are the office that is managing his anticoagulation.  I spoke with Dr. Delana Meyer and he agreed with the plan of care.

## 2018-09-05 NOTE — Telephone Encounter (Signed)
Please advise.. last filled by Dr. Mike Gip

## 2018-09-05 NOTE — Telephone Encounter (Signed)
Patient notified to contact Dr.Concoran

## 2018-09-05 NOTE — Telephone Encounter (Signed)
Pt calling to check on this.  States that his cancer doctor was filling this.  But he now has Dr. Wynetta Emery as his PCP and would like her to fill from here. Pt is also wondering if he can get Gabapentin filled.  He was on 20mg  he thinks and he was taking it once daily.  Pt also states he is waiting on a call back so that he can get an appointment scheduled..  Pt can be reached at [phone].

## 2018-09-05 NOTE — Telephone Encounter (Signed)
Left message on machine for pt to return call to the office.  

## 2018-09-05 NOTE — Telephone Encounter (Signed)
It also looks like he hasn't gotten a refill on his xarelto since February- I would advise him to contact Dr. Mike Gip for refills on his xarelto until he's seen.

## 2018-09-05 NOTE — Telephone Encounter (Signed)
Received message from answering service that patient called and reports out of Rx.  Multiple attempts to call this patient and he did not answer the phone.

## 2018-09-05 NOTE — Telephone Encounter (Signed)
Patient has been informed with medical recommendations and the patient ask do he need to schedule appointment to have port removal and per Arna Medici NP the patient doctor should send referral to have port removal schedule

## 2018-09-05 NOTE — Telephone Encounter (Signed)
I have not seen him since November. Needs follow up to discuss me writing his medications for him

## 2018-09-05 NOTE — Telephone Encounter (Signed)
Spoke with the patient to inform him that per Dr Mike Gip she would like for him to come in for labs this week, The patient has agreed to come in tomorrow for labs, Dr Mike Gip will refill 1 month of his Xarelto at this time. The patient was informed.

## 2018-09-06 ENCOUNTER — Telehealth: Payer: Self-pay

## 2018-09-06 ENCOUNTER — Other Ambulatory Visit: Payer: Self-pay

## 2018-09-06 ENCOUNTER — Inpatient Hospital Stay: Payer: Medicaid Other | Attending: Hematology and Oncology

## 2018-09-06 DIAGNOSIS — C6212 Malignant neoplasm of descended left testis: Secondary | ICD-10-CM | POA: Insufficient documentation

## 2018-09-06 DIAGNOSIS — Z86718 Personal history of other venous thrombosis and embolism: Secondary | ICD-10-CM

## 2018-09-06 LAB — CBC WITH DIFFERENTIAL/PLATELET
Abs Immature Granulocytes: 0.01 10*3/uL (ref 0.00–0.07)
Basophils Absolute: 0 10*3/uL (ref 0.0–0.1)
Basophils Relative: 1 %
Eosinophils Absolute: 0.2 10*3/uL (ref 0.0–0.5)
Eosinophils Relative: 4 %
HCT: 39.3 % (ref 39.0–52.0)
Hemoglobin: 12.9 g/dL — ABNORMAL LOW (ref 13.0–17.0)
Immature Granulocytes: 0 %
Lymphocytes Relative: 15 %
Lymphs Abs: 0.8 10*3/uL (ref 0.7–4.0)
MCH: 29.9 pg (ref 26.0–34.0)
MCHC: 32.8 g/dL (ref 30.0–36.0)
MCV: 91.2 fL (ref 80.0–100.0)
Monocytes Absolute: 0.4 10*3/uL (ref 0.1–1.0)
Monocytes Relative: 8 %
Neutro Abs: 3.9 10*3/uL (ref 1.7–7.7)
Neutrophils Relative %: 72 %
Platelets: 323 10*3/uL (ref 150–400)
RBC: 4.31 MIL/uL (ref 4.22–5.81)
RDW: 13.4 % (ref 11.5–15.5)
WBC: 5.3 10*3/uL (ref 4.0–10.5)
nRBC: 0 % (ref 0.0–0.2)

## 2018-09-06 LAB — COMPREHENSIVE METABOLIC PANEL
ALT: 22 U/L (ref 0–44)
AST: 22 U/L (ref 15–41)
Albumin: 4.8 g/dL (ref 3.5–5.0)
Alkaline Phosphatase: 68 U/L (ref 38–126)
Anion gap: 12 (ref 5–15)
BUN: 19 mg/dL (ref 6–20)
CO2: 24 mmol/L (ref 22–32)
Calcium: 9.4 mg/dL (ref 8.9–10.3)
Chloride: 105 mmol/L (ref 98–111)
Creatinine, Ser: 1.07 mg/dL (ref 0.61–1.24)
GFR calc Af Amer: >60 mL/min (ref >60)
GFR calc non Af Amer: >60 mL/min (ref >60)
Glucose, Bld: 135 mg/dL — ABNORMAL HIGH (ref 70–99)
Potassium: 3.4 mmol/L — ABNORMAL LOW (ref 3.5–5.1)
Sodium: 141 mmol/L (ref 135–145)
Total Bilirubin: 0.8 mg/dL (ref 0.3–1.2)
Total Protein: 7.6 g/dL (ref 6.5–8.1)

## 2018-09-06 MED ORDER — RIVAROXABAN 20 MG PO TABS: 20.0000 mg | ORAL_TABLET | Freq: Every day | ORAL | 1 refills | Status: DC

## 2018-09-06 MED ORDER — SODIUM CHLORIDE 0.9% FLUSH
10.0000 mL | INTRAVENOUS | Status: DC | PRN
  Administered 2018-09-06: 10 mL via INTRAVENOUS
  Filled 2018-09-06: qty 10

## 2018-09-06 MED ORDER — HEPARIN SOD (PORK) LOCK FLUSH 100 UNIT/ML IV SOLN
500.0000 [IU] | Freq: Once | INTRAVENOUS | Status: AC
  Administered 2018-09-06: 500 [IU] via INTRAVENOUS

## 2018-09-06 NOTE — Telephone Encounter (Signed)
Spoke with the patient to inform him that Xarelto 20 mg 1 tab po daily # 30  with 1 refill has been refill today and the patient has been made aware.

## 2018-09-06 NOTE — Telephone Encounter (Signed)
Attempted to contact patient regarding patient wanting Port removed. Left VM requesting callback.

## 2018-09-28 ENCOUNTER — Other Ambulatory Visit: Payer: Self-pay | Admitting: Hematology and Oncology

## 2018-09-28 DIAGNOSIS — I82412 Acute embolism and thrombosis of left femoral vein: Secondary | ICD-10-CM

## 2018-09-29 ENCOUNTER — Other Ambulatory Visit: Payer: Self-pay

## 2018-09-29 DIAGNOSIS — C6212 Malignant neoplasm of descended left testis: Secondary | ICD-10-CM

## 2018-10-04 NOTE — Progress Notes (Signed)
[facility]  [address] [place], [place] [address] Phone: [phone]  Fax: [phone]   Clinic Day:  10/05/2018  Referring physician: Valerie Roys, DO  Chief Complaint: Edgar [name] is a 28 y.o. male with recurrent testicular cancer who is seen for 8 month assessment after interval retroperitoneal lymph node dissection.   HPI: The patient was last seen in the medical oncology clinic on 02/04/2018. At that time, he was feeling good.  Exam revealed a healing left calf lesion.   He was seen at the [facility] by Dr. Tessie Eke on 02/21/2018. His case was to be reviewed at tumor board to determine if he would benefit from RPLND.  AFP was 1, beta HCG was < 5, and LDH 453 on 02/21/2018.  He underwent robotic assisted laparoscopic retroperitoneal lymph node dissection and robotic inguinofemoral lymph node dissection on 05/19/2018 by Dr. Raymond Gurney at [facility].  Pathology revealed a left common iliac node with a large necrotic tumor deposit consistent with treatment effect (no residual viable tumor).  There were 3 interaortocaval lymph nodes, 9 paracaval lymph nodes, and 3 periaortic lymph nodes negative for malignancy.  PET scan at [facility] on 04/29/2018 revealed no evidence of metabolically active disease.  He underwent an IVC filter removal (placed for history of LLE DVT) by Dr. Delana Meyer on 03/23/2018. He underwent an IVC filter placement on 04/12/2018 with Dr. Delana Meyer. He is currently on Xarelto.   Labs on 09/06/2018: hematocrit 39.3, hemoglobin 12.9, platelets 323,000, WBC 5,300. Potassium 3.4.   During the interim, he is doing okay. He reports lack of feeling in his feet and occasional pain and swelling in his left leg. He reports his anxiety has been increased recently. Skin lesions on his left leg have healed, but he occasionally has difficulty lifting his left leg.   He reports he has unintentionally lost 18 lbs since his surgery in January. He was  not able to eat sweets or fatty food following the surgery and they now cause stomach pain when he eats them. His diet is "lighter" and he reports everything tastes "blah."  He continues on Xarleto and denies any bruising but notes bleeding more with cuts and scrapes. He denies any lumps or bumps.   He reports he cannot do much work outside anymore and has an interview with a [employer] today. 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   He does not currently have a PCP.    Past Medical History:  Diagnosis Date   Asthma    AS A CHILD-NO INHALERS   Cancer ([place])    testicular cancer 11/2016 per pt    DVT (deep venous thrombosis) (HCC)    GERD (gastroesophageal reflux disease)    TUMS PRN    Past Surgical History:  Procedure Laterality Date   ANKLE FRACTURE SURGERY Right 2004   FINE NEEDLE ASPIRATION BIOPSY N/A 11/09/2017   Procedure: FINE NEEDLE ASPIRATION BIOPSY;  Surgeon: [name], MD;  Location: [facility];  Service: Cardiovascular;  Laterality: N/A;   IVC FILTER INSERTION     IVC FILTER INSERTION N/A 04/12/2018   Procedure: IVC FILTER INSERTION;  Surgeon: [name], MD;  Location: [place];  Service: Cardiovascular;  Laterality: N/A;   IVC FILTER REMOVAL N/A 03/23/2018   Procedure: IVC FILTER REMOVAL;  Surgeon: [name], MD;  Location: [facility] [facility];  Service: Cardiovascular;  Laterality: N/A;   ORCHIECTOMY Left 11/26/2015  Procedure: ORCHIECTOMY;  Surgeon: [name], MD;  Location: [facility];  Service: Urology;  Laterality: Left;  radical/Inguinal approach   PERIPHERAL VASCULAR CATHETERIZATION N/A 12/16/2015   Procedure: [name] Cath Insertion;  Surgeon: [name], MD;  Location: [place];  Service: Cardiovascular;  Laterality: N/A;   PERIPHERAL VASCULAR THROMBECTOMY Left 10/13/2017   Procedure: PERIPHERAL VASCULAR THROMBECTOMY;  Surgeon: [name], MD;  Location: [place] [facility];  Service: Cardiovascular;  Laterality: Left;   WISDOM TOOTH EXTRACTION  2017    Family History  Problem Relation Age of Onset   Skin cancer Mother    Breast cancer Maternal Grandmother    Colon cancer Maternal Grandfather    Diabetes Maternal Grandfather    Emphysema Father    Mental illness Sister        anxiety   Stroke Paternal Grandmother    Prostate cancer Neg Hx    Kidney cancer Neg Hx    Bladder Cancer Neg Hx     Social History:  reports that he has been smoking cigarettes. He has a 4.00 pack-year smoking history. He has quit using smokeless tobacco.  His smokeless tobacco use included snuff. He reports previous drug use. Drug: Marijuana. He reports that he does not drink alcohol. He smokes 1/2 pack a day.  He smokes marijuana.  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 He has a 21 year-old-daughter named [name]. The patient's mother's cell phone number is [phone].  Another contact number is [phone].  The patient lives in [place]. He is alone today.   Allergies: No Known Allergies  Current Medications: Current Outpatient Medications  Medication Sig Dispense Refill   gabapentin (NEURONTIN) 300 MG capsule TAKE 1 CAPSULE BY MOUTH EVERYDAY AT BEDTIME     XARELTO 20 MG TABS tablet TAKE 1 TABLET BY MOUTH EVERY DAY 30 tablet 1   No current facility-administered medications for this visit.    Facility-Administered Medications Ordered in Other Visits  Medication Dose Route Frequency Provider Last Rate Last Dose   filgrastim-sndz (ZARXIO) injection 480 mcg  480 mcg Subcutaneous Once Jeannia Tatro, Drue Second, MD       filgrastim-sndz [facility]) injection 480 mcg  480 mcg Subcutaneous Once [name], MD        Review of Systems  Constitutional: Positive for malaise/fatigue (Improving) and weight loss (18lbs since 03/2018). Negative for chills, diaphoresis and fever.       Feels well.   HENT: Negative.  Negative for congestion,  hearing loss, nosebleeds, sinus pain and sore throat.   Eyes: Negative.  Negative for double vision, pain and discharge.  Respiratory: Negative.  Negative for cough, hemoptysis, sputum production and shortness of breath.   Cardiovascular: Positive for leg swelling (intermittent, left leg). Negative for chest pain, palpitations, orthopnea and PND.  Gastrointestinal: Negative.  Negative for abdominal pain, blood in stool, constipation, diarrhea, melena and nausea.       Loss of taste  Genitourinary: Negative.  Negative for dysuria, frequency, hematuria and urgency.  Musculoskeletal: Positive for back pain. Negative for falls, joint pain, myalgias and neck pain.  Skin: Negative.  Negative for itching and rash.  Neurological: Positive for sensory change (neuropathy in fingertips and feet). Negative for dizziness, tremors, focal weakness and weakness.  Endo/Heme/Allergies: Bruises/bleeds easily (on Xarelto).  Psychiatric/Behavioral: Negative for depression, memory loss and suicidal ideas. The patient is nervous/anxious. The patient does not have insomnia.   All other systems reviewed and are negative.  Performance  status (ECOG): 1  Vitals Blood pressure (!) 139/92, pulse (!) 110, temperature 97.7 F (36.5 C), temperature source Oral, resp. rate 16, weight 242 lb 4.6 oz (109.9 kg), SpO2 100 %.   Physical Exam  Constitutional: He is oriented to person, place, and time. He appears well-developed and well-nourished. No distress.  HENT:  Head: Normocephalic and atraumatic.  Mouth/Throat: Oropharynx is clear and moist. No oropharyngeal exudate.  Brown hair and beard. Bandana mask.  Eyes: Pupils are equal, round, and reactive to light. Conjunctivae and EOM are normal. No scleral icterus.  Neck: Normal range of motion. Neck supple.  Cardiovascular: Normal rate, regular rhythm and normal heart sounds.  No murmur heard. Pulmonary/Chest: Effort normal and breath sounds normal. No respiratory distress.  He has no wheezes.  Abdominal: Soft. Bowel sounds are normal. He exhibits no distension. There is no abdominal tenderness.  Genitourinary:    Prostate and penis normal.  Right testis shows no mass, no swelling and no tenderness. No penile tenderness.    Genitourinary Comments: s/p left orchiectomy; no masses.   Musculoskeletal: Normal range of motion.        General: No edema.  Lymphadenopathy:    He has no cervical adenopathy.    He has no axillary adenopathy.       Right: No supraclavicular adenopathy present.       Left: No supraclavicular adenopathy present.  Neurological: He is alert and oriented to person, place, and time.  Skin: Skin is warm and dry. He is not diaphoretic. No erythema.  Psychiatric: His behavior is normal. Judgment and thought content normal. His mood appears anxious.  Nursing note and vitals reviewed.   Imaging studies: 11/28/2015:  Chest, abdomen, and pelvic CT revealed no mediastinal adenopathy or suspicious pulmonary nodules.   There were 2 prominent left periaortic retroperitoneal lymph nodes (1.7 cm and 0.9 cm) concerning for testicular nodal metastasis.  There were no abnormal lymph nodes above the renal veins.  There were postsurgical change in the left hemiscrotum and left inguinal canal.  There was a 3.3 cm soft tissue abnormality anterior to the left iliopsoas muscle which could represent a metastatic lymph node (N2 lesion).  12/17/2015:  Head MRI revealed no evidence of metastatic disease.   03/30/2016:  Chest, abdomen, and pelvic CT revealed interval resolution of left abdominal/pelvic lymphadenopathy.  There was no residual metastatic disease. 09/30/2016:  Chest, abdomen, and pelvic CT revealed no evidence of recurrent or metastatic disease.  09/10/2017:  Bilateral lower extremity duplex revealed evidence of obstruction in the left CFV, SFJ, and proximal femoral vein.  09/10/2017:  CT pelvic venogram revealed multiple retroperitoneal masses including  large mass within the left iliacus concerning for metastatic disease in the setting of prior testicular cancer.  Mass in the left iliacus caused significant narrowing of the proximal left external iliac vein. There was filling defect within the distal left external iliac vein extending into the common femoral and proximal superficial femoral veins consistent with thrombus  09/11/2017:  Chest, abdomen, and pelvic CT revealed enlarged and morphologically abnormal 4.3 x 4.9 cm left pelvic sidewall and retroperitoneal lymph nodes (measuring up to 2.9 cm) concerning for metastatic disease given history of testicular cancer. Biopsy was suggested for confirmation. Prominent left inguinal lymph nodes could be metastatic or reactive in nature.  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  There was anasarca/swelling of the left lower extremity.  There was no evidence of metastatic disease  in the chest. 09/13/2017:  Head MRI revealed no evidence of metastatic disease. 11/17/2017:  Right foot MRI revealed artifact in the plantar soft tissues over the first metatarsal head likely representing metallic foreign bodies as seen on previous radiographs.  There was an area of increased T2 signal intensity in the plantar aspect of the first metatarsal head with focal enhancement may indicate an area of focal osteomyelitis.  There was no soft tissue abscess. 12/07/2017:  Abdomen and pelvic CTrevealed no new or progressive metastatic disease.There was no acute vascular abnormality. IVC filter and left iliac venous stent graft werein place. There was persistent postsurgical fat stranding throughout the leftinguinal region without fluid collection 12/08/2017:  Bilateral lower extremity duplex revealed new nonocclusive thrombus in the left common femoralveinalong the anterior wall measuring 3-5 mm in thickness. This wasnot flow limiting but is new since the  prior study on 11/05/2017 and consistent with acute/subacute thrombus(old per Dr. Delana Meyer). There was no evidence of right lower extremity deep vein thrombosis 02/03/2018:  PET scan revealed left common and external iliac adenopathy with only low-grade activity, less than that of the blood pool. The dominant lymph node is an external iliac node measuring 1.8 x 2.4 cm (previously 2.3 x 3.2 cm). There was no new or enlarging adenopathy.  There was low-grade activity along the left inguinal ring region near the orchectomy site, thought to be postoperative and not due to local malignancy.   Admissions: He was admitted to [facility] from 10/08/2017 - 10/16/2017 with left lower extremity cellulitis. He was treated with Cefepime and vancomycin. Imaging revealedsignificant improvement in adenopathy with decreased pelvic sidewall disease and decreased bulk at aortic bifurcation. He was felt tohave venous congestion syndrome with skin lesions due to congestion.  He was admitted to [facility] from 10/21/2017 - 10/26/2017 for inpatient chemotherapy (cycle #2 TIP).    He was admitted to [facility] from 11/05/2017 - 11/09/2017 for cellulitis.  He was developing similar lower extremity skin lesions following his first cycle of chemotherapy.  Skin biopsy revealed diffuse neutrophilic dermatitis. Cultures were negative.  Xarelto was held secondary to thrombocytopenia (nadir platelet count 39,000).  He was treated with heparin.  Lower extremity duplex revealed no clot.  He was treated with Cefepime and vancomycin.  He was discharged on Augmentin.  He received oral acyclovir for herpetic labialis.  He was admitted to [facility] from 11/22/2017 - 11/27/2017 for inpatient chemotherapy (cycle #3 TIP).    He was admitted to [facility] 12/07/2017 - 12/11/2017 with fever and lower extremity edema. He developed recurrent leg lesions. 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 He was diagnosed with Sweet's syndrome(felt  due to [facility]) and began Solumedrol80 mg IV q day on 12/09/2017. He fevers quickly resolved. He received1unit of PRBCs. Platelets became >50,000 on 12/11/2017 and Xarelto was restarted.  He was admitted to [facility] from 12/27/2017 - 01/01/2018 for inpatient chemotherapy (cycle #4 TIP).  He was admitted to [facility] from 01/11/2018 - 01/13/2018 for pancytopenia. Platelet count was 6,000. He received 1 unit of platelets. Hemoglobin dropped to 7.3 on 01/12/2018. He received 1 units of PRBCs. He was discharged on 01/13/2018 with a hematocrit of 25.7. Hemoglobin 8.8, platelets 24,000, and WBC 7300.     Appointment on 10/05/2018  Component Date Value Ref Range Status   Magnesium 10/05/2018 2.3  1.7 - 2.4 mg/dL Final   Performed at [facility], [address], [place], [place] [address]   LDH 10/05/2018 130  98 - 192 U/L Final  Performed at [facility], [address], [place], [place] [address]   Sodium 10/05/2018 140  135 - 145 mmol/L Final   Potassium 10/05/2018 3.4* 3.5 - 5.1 mmol/L Final   Chloride 10/05/2018 105  98 - 111 mmol/L Final   CO2 10/05/2018 25  22 - 32 mmol/L Final   Glucose, Bld 10/05/2018 111* 70 - 99 mg/dL Final   BUN 10/05/2018 8  6 - 20 mg/dL Final   Creatinine, Ser 10/05/2018 1.01  0.61 - 1.24 mg/dL Final   Calcium 10/05/2018 9.6  8.9 - 10.3 mg/dL Final   Total Protein 10/05/2018 8.1  6.5 - 8.1 g/dL Final   Albumin 10/05/2018 4.8  3.5 - 5.0 g/dL Final   AST 10/05/2018 18  15 - 41 U/L Final   ALT 10/05/2018 18  0 - 44 U/L Final   Alkaline Phosphatase 10/05/2018 73  38 - 126 U/L Final   Total Bilirubin 10/05/2018 0.9  0.3 - 1.2 mg/dL Final   GFR calc non Af Amer 10/05/2018 >60  >60 mL/min Final   GFR calc Af Amer 10/05/2018 >60  >60 mL/min Final   Anion gap 10/05/2018 10  5 - 15 Final   Performed at [facility], [address] [place], [place] [address]   WBC 10/05/2018 5.8  4.0 - 10.5 K/uL Final   RBC  10/05/2018 4.49  4.22 - 5.81 MIL/uL Final   Hemoglobin 10/05/2018 13.7  13.0 - 17.0 g/dL Final   HCT 10/05/2018 41.5  39.0 - 52.0 % Final   MCV 10/05/2018 92.4  80.0 - 100.0 fL Final   MCH 10/05/2018 30.5  26.0 - 34.0 pg Final   MCHC 10/05/2018 33.0  30.0 - 36.0 g/dL Final   RDW 10/05/2018 13.5  11.5 - 15.5 % Final   Platelets 10/05/2018 340  150 - 400 K/uL Final   nRBC 10/05/2018 0.0  0.0 - 0.2 % Final   Neutrophils Relative % 10/05/2018 73  % Final   Neutro Abs 10/05/2018 4.2  1.7 - 7.7 K/uL Final   Lymphocytes Relative 10/05/2018 14  % Final   Lymphs Abs 10/05/2018 0.8  0.7 - 4.0 K/uL Final   Monocytes Relative 10/05/2018 10  % Final   Monocytes Absolute 10/05/2018 0.6  0.1 - 1.0 K/uL Final   Eosinophils Relative 10/05/2018 3  % Final   Eosinophils Absolute 10/05/2018 0.2  0.0 - 0.5 K/uL Final   Basophils Relative 10/05/2018 0  % Final   Basophils Absolute 10/05/2018 0.0  0.0 - 0.1 K/uL Final   Immature Granulocytes 10/05/2018 0  % Final   Abs Immature Granulocytes 10/05/2018 0.01  0.00 - 0.07 K/uL Final   Performed at [facility], [address], [place], [place] [address]    Assessment:  Edgar [name] is a 28 y.o. male with recurrent testicular cancer.  He presented with a left lower extremity DVT on 09/10/2017.  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  Clinical/pathologic stage is T1 N1-2 S0 M0.  He received 3 cycles of  BEP (12/30/2015 - 02/24/2016).  He received OnPro Neulasta with cycle #2 and #3.  He declined evaluation for retroperitoneal lymph node dissection (RPLND).  He declined sperm banking.  Pre surgery labs on 11/20/2015 revealed an AFP of 411.9,  beta-HCG 2,669, and LDH 522 (121-224).    Tumor markers have  been followed:   AFP was 411.9 (0-8.3) on 11/20/2015, 11.2 on 12/19/2015, 6.3 on 12/23/2015, 1.3 on 01/27/2016, 1.8 on 02/17/2016, 1.9 on 03/30/2016, 1.4 on 05/27/2016, 1.0 on 07/29/2016, 1.4 on 10/14/2016, 3.3 on 12/24/2016, 1 on 09/10/2017, 1 on 09/20/2017,  2.6 on 10/21/2017, 2.1 on 11/16/2017, 2.1 on 12/20/2017, 2.1 on 02/04/2018, and 1.3 on 10/05/2018.    Beta-HCG was 2,669 (0-3) on 11/20/2015, 2 on 12/19/2015, 1 on 12/23/2015, < 1 on 01/27/2016, < 1 on 02/17/2016, < 1 on 03/30/2016, < 1 on 05/27/2016,  < 1 on 07/29/2016, < 1 on 10/14/2016, and 3 on 12/24/2016, < 5 on 09/11/2017,  < 5 on 09/20/2017, < 1 on 10/21/2017, < 1 on 11/16/2017, < 1 on 12/20/2017, < 1 on 02/04/2018, and < 1 on 10/05/2018.  LDH was 522 (98-192) on 11/20/2015, 179 on 12/19/2015, 160 on 12/23/2015, 202 on 01/27/2016, 156 on 02/10/2016, 161 on 02/17/2016, 172 on 03/30/2016, 178 on 05/27/2016, 150 on 07/29/2016, 152 on 10/14/2016, 145 on 12/24/2016, 1557 on 09/11/2017, 916 on 09/20/2017, 144 on 10/21/2017, 158 on 11/16/2017, 203 on 12/20/2017, 151 on 12/27/2017, 133 on 02/04/2018, and 130 on 10/05/2018.  Chest, abdomen, and pelvic CT on 09/11/2017 revealed enlarged and morphologically abnormal 4.3 x 4.9 cm left pelvic sidewall and retroperitoneal lymph nodes (measuring up to 2.9 cm).  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  There was anasarca/swelling of the left lower extremity.  There was no evidence of metastatic disease in the chest.  Head MRI on 09/13/2017 revealed no evidence of metastatic disease.   He has a left lower extremity DVT.  Bilateral lower extremity duplex on 09/10/2017 revealed evidence of obstruction in the left CFV, SFJ, and proximal femoral vein.  He is on Xarelto.  He received 4 cycles of TIP chemotherapy (09/21/2017 - 12/27/2017). Cycle #1 was administered at [facility]. Cycles #2 and 3 were administered at [facility].  Both cycles 1 and 2  were complicated by lower extremity skin lesions on recovery of counts. 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  Left calf lesion progressed to pyoderma gangrenosum.  He completed a prednisonetaper on 11/x/2019. Patient received daily Zarxio injections from 01/03/2018 - 01/12/2018.  PET scan on 02/03/2018 revealed left common and external iliac adenopathy with only low-grade activity, less than that of the blood pool. The dominant lymph node is an external iliac node measuring 1.8 x 2.4 cm (previously 2.3 x 3.2 cm). There was no new or enlarging adenopathy.  There was low-grade activity along the left inguinal ring region near the orchectomy site, thought to be postoperative and not due to local malignancy.  He underwent robotic assisted laparoscopic retroperitoneal lymph node dissection and robotic inguinofemoral lymph node dissection on 05/19/2018 at [facility].  Pathology revealed a left common iliac node with a large necrotic tumor deposit consistent with treatment effect (no residual viable tumor).  There were 3 interaortocaval lymph nodes, 9 paracaval lymph nodes, and 3 periaortic lymph nodes negative for malignancy.  Audiogram on 10/27/2017 revealed hearing within normal limits.  He underwent IVC filterplacement, percutaneous angioplastyand stent placementof the left common iliac vein and left external iliac vein on 10/13/2017.He underwent an IVC filter removal on 03/23/2018 and replacement on 04/12/2018. He is currently on Xarelto.   Hypercoagulable work-up on 10/12/2017 negative for the following:Factor V Leiden, prothrombin gene mutation,lupus anticoagulant, anticardiolipin antibodies, beta-2 glycoprotein, protein C  activity/antigen, protein S activity/antigen. ATIIIwas54% (75-120) on heparin.  Symptomatically, he is doing well.  He has lost weight with a change in his diet.  Exam reveals no evidence of recurrent disease.  Plan: 1.   Labs  today:  CBC with diff, CMP, Mg, AFP, beta-HCG, LDH. 2.   Recurrent testicular cancer Patient completed 4 cycles of TIP chemotherapy.   He underwent retroperitoneal lymph node dissection on 05/19/2018.  Pathology reviewed.  He had 1 necrotic lymph node with no viable tumor. Discuss plan for ongoing surveillance.   Chest, abdomen, pelvic CT scan between 8/14 - 11/19/2018. 3.   LEFT lower extremity DVT Patient is a s/p replacement of the IVC filter. He is felt at risk for re-thrombosis. Continue Xarelto. 4.   Chemotherapy-induced neuropathy Patient has a stable neuropathy in his feet. He is on gabapentin at night. 5.   Sweet's syndrome Evidence of active disease. Patient has scarring on his lower extremities from prior disease. Patient sees Dr Marolyn Hammock at [facility] dermatology. 6.   Health maintenance  Discuss obtaining primary care physician. 7.   RTC in 2 months for MD assessment, labs (CBC with diff, CMP, LDH, AFP, beta-HCG).  I discussed the assessment and treatment plan with the patient.  The patient was provided an opportunity to ask questions and all were answered.  The patient agreed with the plan and demonstrated an understanding of the instructions.  The patient was advised to call back if the symptoms worsen or if the condition fails to improve as anticipated.   [name], MD, PhD    10/05/2018, 1:36 PM  I, Molly Dorshimer, am acting as Education administrator for [employer]. Mike Gip, MD, PhD.  I, Ahlivia Salahuddin C. Mike Gip, MD, have reviewed the above documentation for accuracy and completeness, and I agree with the above.

## 2018-10-05 ENCOUNTER — Other Ambulatory Visit: Payer: Self-pay | Admitting: Hematology and Oncology

## 2018-10-05 ENCOUNTER — Other Ambulatory Visit: Payer: Self-pay

## 2018-10-05 ENCOUNTER — Inpatient Hospital Stay: Payer: Medicaid Other | Attending: Hematology and Oncology | Admitting: Hematology and Oncology

## 2018-10-05 ENCOUNTER — Encounter: Payer: Self-pay | Admitting: Hematology and Oncology

## 2018-10-05 ENCOUNTER — Inpatient Hospital Stay: Payer: Medicaid Other

## 2018-10-05 VITALS — BP 139/92 | HR 110 | Temp 97.7°F | Resp 16 | Wt 242.3 lb

## 2018-10-05 DIAGNOSIS — Z9221 Personal history of antineoplastic chemotherapy: Secondary | ICD-10-CM

## 2018-10-05 DIAGNOSIS — L982 Febrile neutrophilic dermatosis [Sweet]: Secondary | ICD-10-CM | POA: Insufficient documentation

## 2018-10-05 DIAGNOSIS — R634 Abnormal weight loss: Secondary | ICD-10-CM | POA: Diagnosis not present

## 2018-10-05 DIAGNOSIS — C6212 Malignant neoplasm of descended left testis: Secondary | ICD-10-CM

## 2018-10-05 DIAGNOSIS — F419 Anxiety disorder, unspecified: Secondary | ICD-10-CM

## 2018-10-05 DIAGNOSIS — F17210 Nicotine dependence, cigarettes, uncomplicated: Secondary | ICD-10-CM

## 2018-10-05 DIAGNOSIS — Z800 Family history of malignant neoplasm of digestive organs: Secondary | ICD-10-CM | POA: Diagnosis not present

## 2018-10-05 DIAGNOSIS — Z803 Family history of malignant neoplasm of breast: Secondary | ICD-10-CM

## 2018-10-05 DIAGNOSIS — K219 Gastro-esophageal reflux disease without esophagitis: Secondary | ICD-10-CM | POA: Diagnosis not present

## 2018-10-05 DIAGNOSIS — Z7901 Long term (current) use of anticoagulants: Secondary | ICD-10-CM | POA: Diagnosis not present

## 2018-10-05 DIAGNOSIS — G620 Drug-induced polyneuropathy: Secondary | ICD-10-CM | POA: Diagnosis not present

## 2018-10-05 DIAGNOSIS — Z808 Family history of malignant neoplasm of other organs or systems: Secondary | ICD-10-CM

## 2018-10-05 DIAGNOSIS — Z7189 Other specified counseling: Secondary | ICD-10-CM

## 2018-10-05 DIAGNOSIS — I82512 Chronic embolism and thrombosis of left femoral vein: Secondary | ICD-10-CM

## 2018-10-05 DIAGNOSIS — T451X5A Adverse effect of antineoplastic and immunosuppressive drugs, initial encounter: Secondary | ICD-10-CM | POA: Diagnosis not present

## 2018-10-05 DIAGNOSIS — Z86718 Personal history of other venous thrombosis and embolism: Secondary | ICD-10-CM

## 2018-10-05 LAB — CBC WITH DIFFERENTIAL/PLATELET
Abs Immature Granulocytes: 0.01 10*3/uL (ref 0.00–0.07)
Basophils Absolute: 0 10*3/uL (ref 0.0–0.1)
Basophils Relative: 0 %
Eosinophils Absolute: 0.2 10*3/uL (ref 0.0–0.5)
Eosinophils Relative: 3 %
HCT: 41.5 % (ref 39.0–52.0)
Hemoglobin: 13.7 g/dL (ref 13.0–17.0)
Immature Granulocytes: 0 %
Lymphocytes Relative: 14 %
Lymphs Abs: 0.8 10*3/uL (ref 0.7–4.0)
MCH: 30.5 pg (ref 26.0–34.0)
MCHC: 33 g/dL (ref 30.0–36.0)
MCV: 92.4 fL (ref 80.0–100.0)
Monocytes Absolute: 0.6 10*3/uL (ref 0.1–1.0)
Monocytes Relative: 10 %
Neutro Abs: 4.2 10*3/uL (ref 1.7–7.7)
Neutrophils Relative %: 73 %
Platelets: 340 10*3/uL (ref 150–400)
RBC: 4.49 MIL/uL (ref 4.22–5.81)
RDW: 13.5 % (ref 11.5–15.5)
WBC: 5.8 10*3/uL (ref 4.0–10.5)
nRBC: 0 % (ref 0.0–0.2)

## 2018-10-05 LAB — COMPREHENSIVE METABOLIC PANEL
ALT: 18 U/L (ref 0–44)
AST: 18 U/L (ref 15–41)
Albumin: 4.8 g/dL (ref 3.5–5.0)
Alkaline Phosphatase: 73 U/L (ref 38–126)
Anion gap: 10 (ref 5–15)
BUN: 8 mg/dL (ref 6–20)
CO2: 25 mmol/L (ref 22–32)
Calcium: 9.6 mg/dL (ref 8.9–10.3)
Chloride: 105 mmol/L (ref 98–111)
Creatinine, Ser: 1.01 mg/dL (ref 0.61–1.24)
GFR calc Af Amer: >60 mL/min (ref >60)
GFR calc non Af Amer: >60 mL/min (ref >60)
Glucose, Bld: 111 mg/dL — ABNORMAL HIGH (ref 70–99)
Potassium: 3.4 mmol/L — ABNORMAL LOW (ref 3.5–5.1)
Sodium: 140 mmol/L (ref 135–145)
Total Bilirubin: 0.9 mg/dL (ref 0.3–1.2)
Total Protein: 8.1 g/dL (ref 6.5–8.1)

## 2018-10-05 LAB — LACTATE DEHYDROGENASE: LDH: 130 U/L (ref 98–192)

## 2018-10-05 LAB — MAGNESIUM: Magnesium: 2.3 mg/dL (ref 1.7–2.4)

## 2018-10-05 NOTE — Progress Notes (Signed)
Pt here for follow up. Reports he continues to have neuropathy in bilateral feet with pain shooting up left leg.

## 2018-10-06 LAB — AFP TUMOR MARKER: AFP, Serum, Tumor Marker: 1.3 ng/mL (ref 0.0–8.3)

## 2018-10-07 LAB — BETA HCG QUANT (REF LAB): hCG Quant: <1 m[IU]/mL (ref 0–3)

## 2018-10-29 ENCOUNTER — Other Ambulatory Visit: Payer: Self-pay | Admitting: Hematology and Oncology

## 2018-10-29 DIAGNOSIS — I82412 Acute embolism and thrombosis of left femoral vein: Secondary | ICD-10-CM

## 2018-11-08 ENCOUNTER — Inpatient Hospital Stay: Payer: Medicaid Other | Attending: Hematology and Oncology

## 2018-11-08 ENCOUNTER — Ambulatory Visit: Admission: RE | Admit: 2018-11-08 | Payer: Medicaid Other | Source: Ambulatory Visit

## 2018-11-21 IMAGING — US US EXTREM LOW VENOUS BILAT
2 series · 12 of 24 positions shown · non-contrast
Comparison: 11/05/2017

ADDENDUM:
There is an error in the report: Nonocclusive thrombus is in the
left common femoral vein.
CLINICAL DATA: Bilateral lower extremity edema.



[Series 1: us extrem low venous bilat · 4 of 38 slices shown (1 of 2)]
[im 5/38]
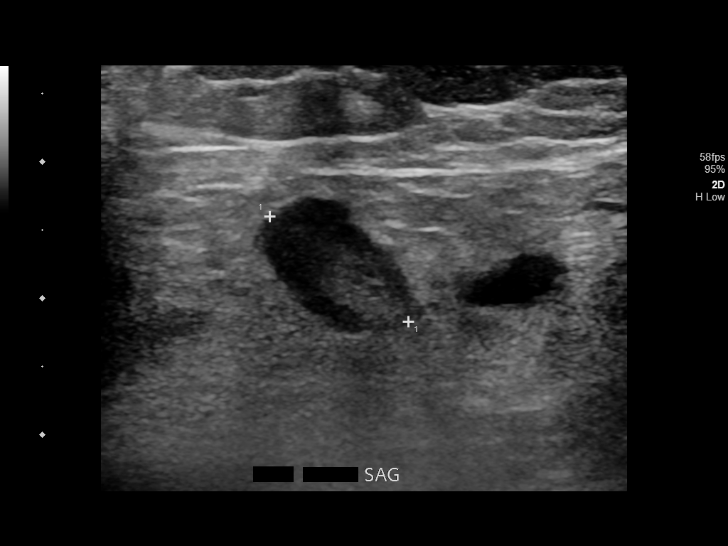
[im 14/38]
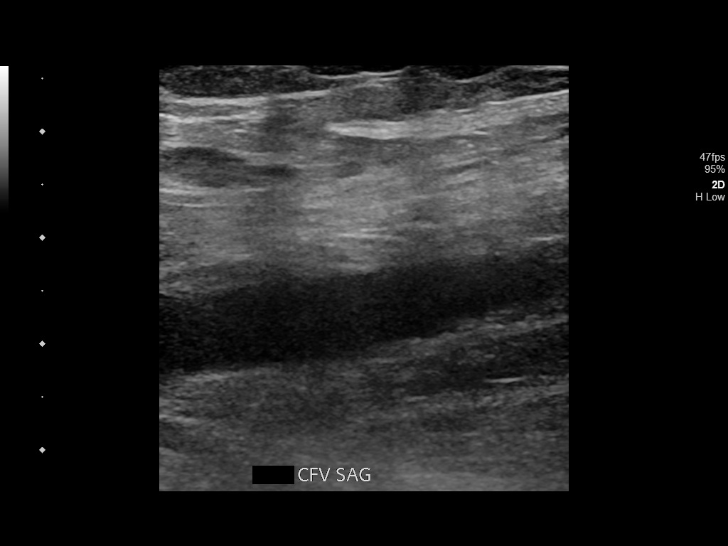
[im 24/38]
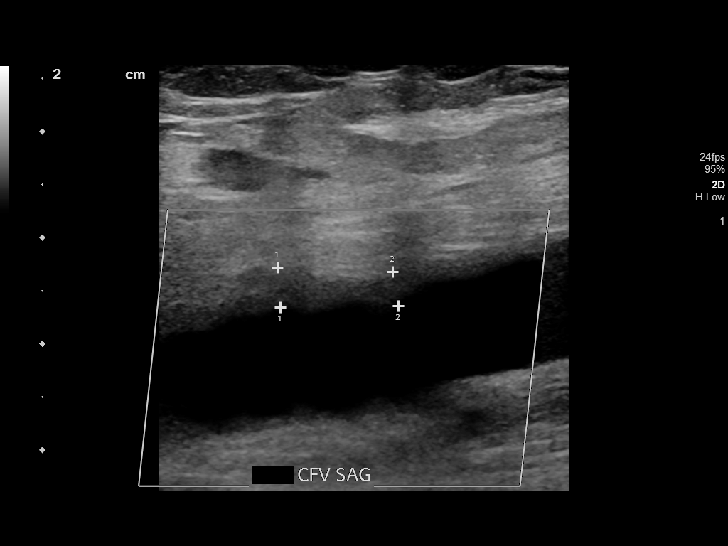
[im 33/38]
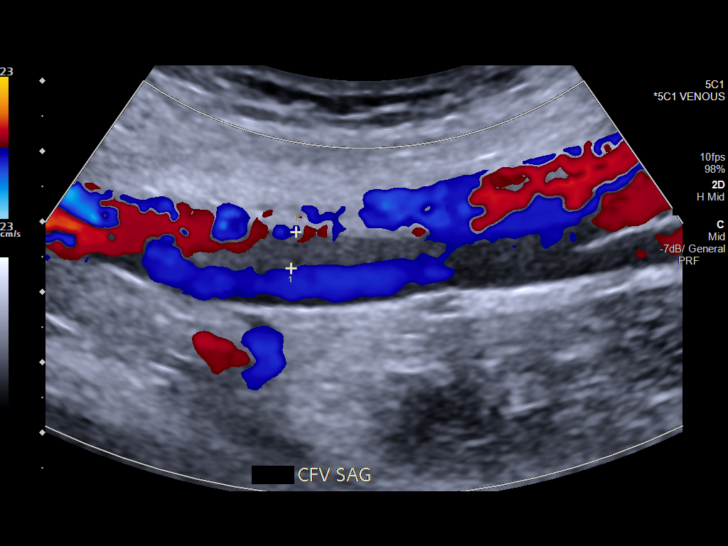

[Series 1: us extrem low venous bilat · 8 of 64 slices shown (2 of 2)]
[im 1/64]
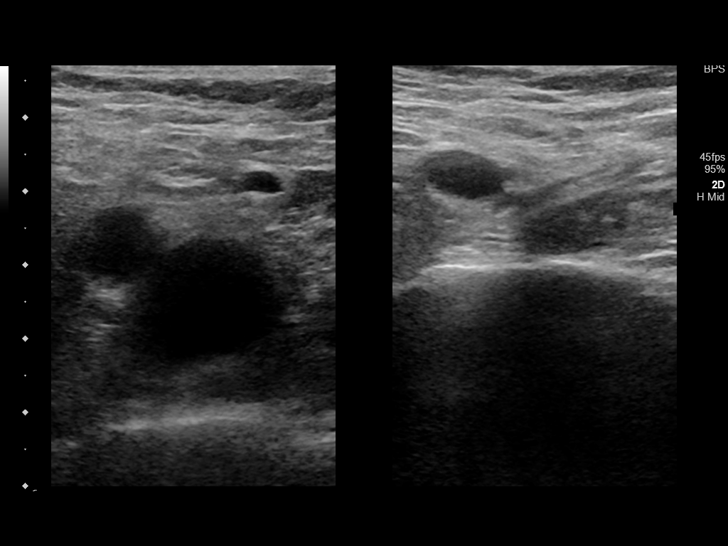
[im 10/64]
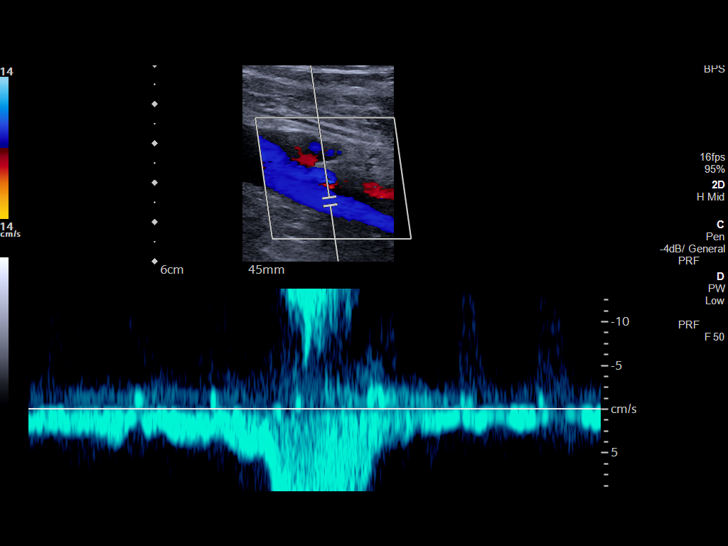
[im 19/64]
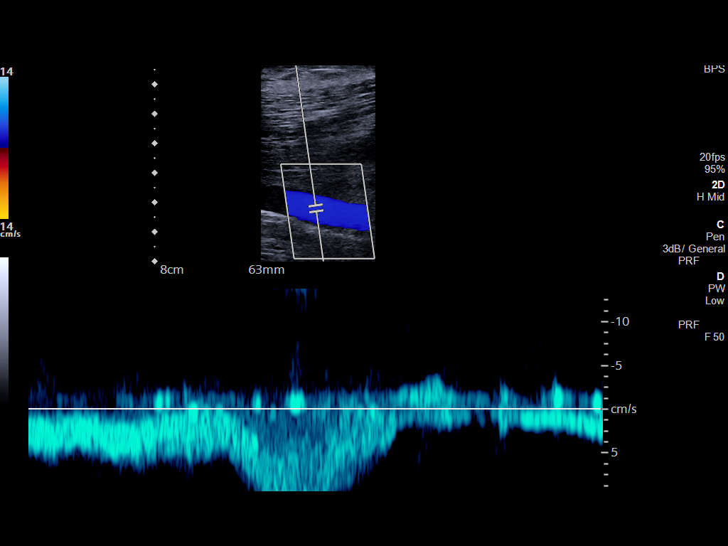
[im 28/64]
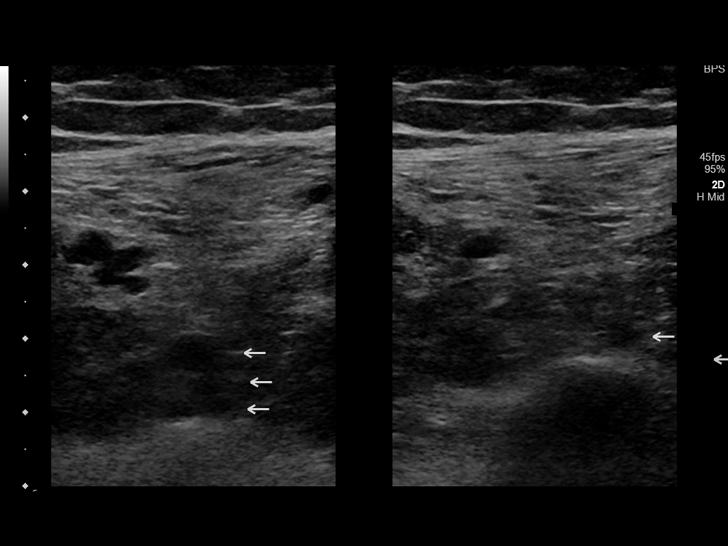
[im 37/64]
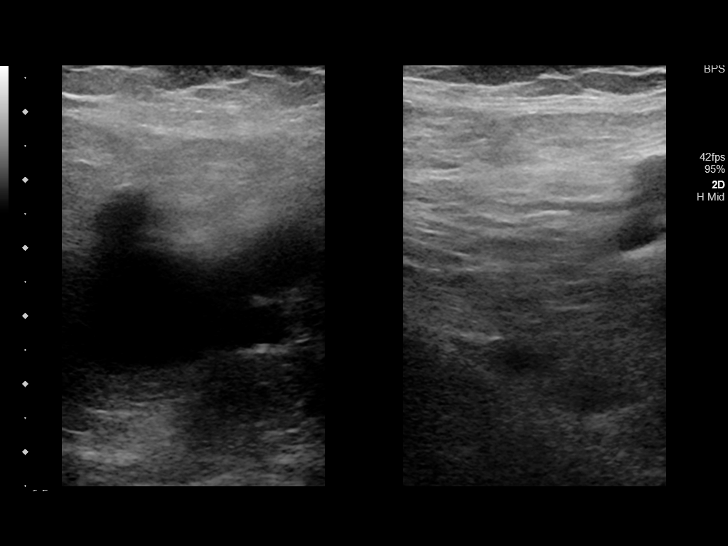
[im 46/64]
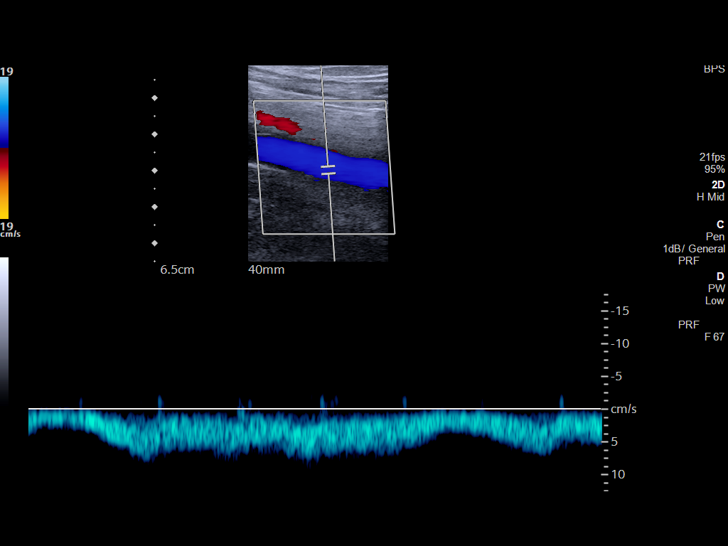
[im 55/64]
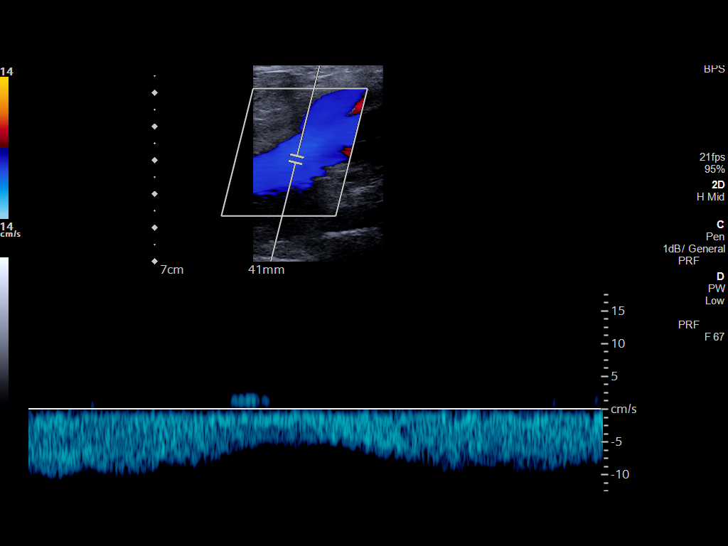
[im 64/64]
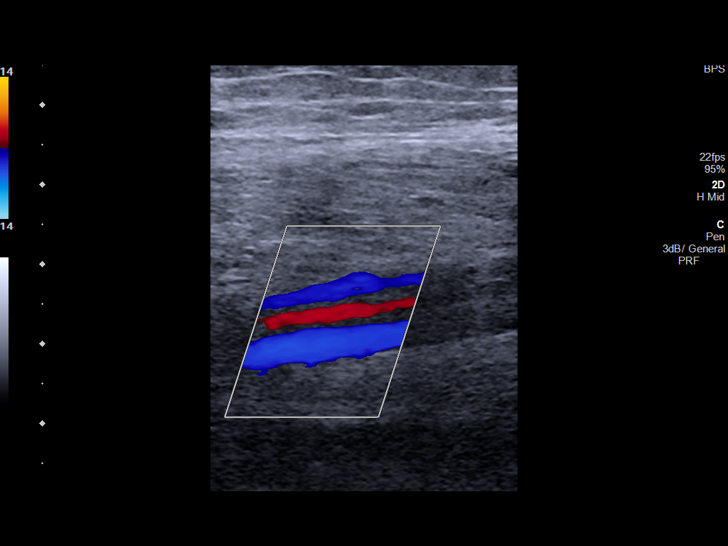

[12 of 24 positions shown; findings below may reference images not displayed]

FINDINGS: RIGHT LOWER EXTREMITY

Common Femoral Vein: No evidence of thrombus. Normal
compressibility, respiratory phasicity and response to augmentation.

Saphenofemoral Junction: No evidence of thrombus. Normal
compressibility and flow on color Doppler imaging.

Profunda Femoral Vein: No evidence of thrombus. Normal
compressibility and flow on color Doppler imaging.

Femoral Vein: No evidence of thrombus. Normal compressibility,
respiratory phasicity and response to augmentation.

Popliteal Vein: No evidence of thrombus. Normal compressibility,
respiratory phasicity and response to augmentation.

Calf Veins: No evidence of thrombus. Normal compressibility and flow
on color Doppler imaging.

Superficial Great Saphenous Vein: No evidence of thrombus. Normal
compressibility.

Venous Reflux:  None.

Other Findings: No evidence of superficial thrombophlebitis or
abnormal fluid collection.

LEFT LOWER EXTREMITY

Common Femoral Vein: There is evidence of new nonocclusive thrombus
in the left common femoral artery along the anterior wall measuring
approximately 3-5 mm in thickness. This does not create flow
limitation with good flow seen in the lumen below the level of
thrombus. However, compared to the prior study just 1 month ago,
this finding is new and consistent with acute/subacute thrombus.

Saphenofemoral Junction: No evidence of thrombus. Normal
compressibility and flow on color Doppler imaging.

Profunda Femoral Vein: No evidence of thrombus. Normal
compressibility and flow on color Doppler imaging.

Femoral Vein: No evidence of thrombus. Normal compressibility,
respiratory phasicity and response to augmentation.

Popliteal Vein: No evidence of thrombus. Normal compressibility,
respiratory phasicity and response to augmentation.

Calf Veins: No evidence of thrombus. Normal compressibility and flow
on color Doppler imaging.

Superficial Great Saphenous Vein: No evidence of thrombus. Normal
compressibility.

Venous Reflux:  None.

Other Findings: No evidence of superficial thrombophlebitis or
abnormal fluid collection.
IMPRESSION: 1. New nonocclusive thrombus in the left common femoral artery along
the anterior wall measuring 3-5 mm in thickness. This is not flow
limiting but is new since the prior study on 11/05/2017 and
consistent with acute/subacute thrombus.
2. No evidence of right lower extremity deep vein thrombosis.

## 2018-11-24 ENCOUNTER — Other Ambulatory Visit: Payer: Self-pay | Admitting: Hematology and Oncology

## 2018-11-24 DIAGNOSIS — I82412 Acute embolism and thrombosis of left femoral vein: Secondary | ICD-10-CM

## 2018-11-28 ENCOUNTER — Telehealth: Payer: Self-pay

## 2018-11-28 NOTE — Telephone Encounter (Signed)
Refill Xarelto 20 mg 1 tab po daily # 30 with 1 refill, Patient was made aware.

## 2018-11-30 ENCOUNTER — Encounter: Payer: Self-pay | Admitting: Hematology and Oncology

## 2018-11-30 ENCOUNTER — Inpatient Hospital Stay: Payer: Medicaid Other | Attending: Hematology and Oncology | Admitting: Hematology and Oncology

## 2018-12-29 ENCOUNTER — Other Ambulatory Visit: Payer: Self-pay | Admitting: Hematology and Oncology

## 2018-12-29 DIAGNOSIS — I82412 Acute embolism and thrombosis of left femoral vein: Secondary | ICD-10-CM

## 2019-01-30 ENCOUNTER — Other Ambulatory Visit: Payer: Self-pay

## 2019-01-30 ENCOUNTER — Inpatient Hospital Stay: Payer: Medicaid Other | Attending: Hematology and Oncology

## 2019-01-30 DIAGNOSIS — I82512 Chronic embolism and thrombosis of left femoral vein: Secondary | ICD-10-CM

## 2019-01-30 DIAGNOSIS — C6212 Malignant neoplasm of descended left testis: Secondary | ICD-10-CM | POA: Diagnosis present

## 2019-01-30 DIAGNOSIS — Z7901 Long term (current) use of anticoagulants: Secondary | ICD-10-CM

## 2019-01-30 LAB — CBC WITH DIFFERENTIAL/PLATELET
Abs Immature Granulocytes: 0.02 10*3/uL (ref 0.00–0.07)
Basophils Absolute: 0 10*3/uL (ref 0.0–0.1)
Basophils Relative: 1 %
Eosinophils Absolute: 0.2 10*3/uL (ref 0.0–0.5)
Eosinophils Relative: 3 %
HCT: 41.6 % (ref 39.0–52.0)
Hemoglobin: 13.6 g/dL (ref 13.0–17.0)
Immature Granulocytes: 0 %
Lymphocytes Relative: 18 %
Lymphs Abs: 1.2 10*3/uL (ref 0.7–4.0)
MCH: 30.2 pg (ref 26.0–34.0)
MCHC: 32.7 g/dL (ref 30.0–36.0)
MCV: 92.2 fL (ref 80.0–100.0)
Monocytes Absolute: 0.6 10*3/uL (ref 0.1–1.0)
Monocytes Relative: 8 %
Neutro Abs: 4.7 10*3/uL (ref 1.7–7.7)
Neutrophils Relative %: 70 %
Platelets: 288 10*3/uL (ref 150–400)
RBC: 4.51 MIL/uL (ref 4.22–5.81)
RDW: 12.9 % (ref 11.5–15.5)
WBC: 6.8 10*3/uL (ref 4.0–10.5)
nRBC: 0 % (ref 0.0–0.2)

## 2019-01-30 LAB — COMPREHENSIVE METABOLIC PANEL
ALT: 17 U/L (ref 0–44)
AST: 18 U/L (ref 15–41)
Albumin: 4.7 g/dL (ref 3.5–5.0)
Alkaline Phosphatase: 64 U/L (ref 38–126)
Anion gap: 11 (ref 5–15)
BUN: 14 mg/dL (ref 6–20)
CO2: 22 mmol/L (ref 22–32)
Calcium: 9.2 mg/dL (ref 8.9–10.3)
Chloride: 105 mmol/L (ref 98–111)
Creatinine, Ser: 1.02 mg/dL (ref 0.61–1.24)
GFR calc Af Amer: >60 mL/min (ref >60)
GFR calc non Af Amer: >60 mL/min (ref >60)
Glucose, Bld: 129 mg/dL — ABNORMAL HIGH (ref 70–99)
Potassium: 3.5 mmol/L (ref 3.5–5.1)
Sodium: 138 mmol/L (ref 135–145)
Total Bilirubin: 0.3 mg/dL (ref 0.3–1.2)
Total Protein: 7.5 g/dL (ref 6.5–8.1)

## 2019-01-30 LAB — LACTATE DEHYDROGENASE: LDH: 129 U/L (ref 98–192)

## 2019-01-30 MED ORDER — SODIUM CHLORIDE 0.9% FLUSH
10.0000 mL | INTRAVENOUS | Status: DC | PRN
  Administered 2019-01-30: 10 mL via INTRAVENOUS
  Filled 2019-01-30: qty 10

## 2019-01-30 MED ORDER — HEPARIN SOD (PORK) LOCK FLUSH 100 UNIT/ML IV SOLN
500.0000 [IU] | Freq: Once | INTRAVENOUS | Status: AC
  Administered 2019-01-30: 14:00:00 500 [IU] via INTRAVENOUS

## 2019-01-31 LAB — AFP TUMOR MARKER: AFP, Serum, Tumor Marker: 1.2 ng/mL (ref 0.0–8.3)

## 2019-01-31 LAB — BETA HCG QUANT (REF LAB): hCG Quant: <1 m[IU]/mL (ref 0–3)

## 2019-02-07 ENCOUNTER — Ambulatory Visit
Admission: RE | Admit: 2019-02-07 | Discharge: 2019-02-07 | Disposition: A | Discharge status: Home / Self Care | Payer: Medicaid Other | Source: Ambulatory Visit | Attending: Hematology and Oncology | Admitting: Hematology and Oncology

## 2019-02-07 ENCOUNTER — Other Ambulatory Visit: Payer: Self-pay

## 2019-02-07 DIAGNOSIS — C6212 Malignant neoplasm of descended left testis: Secondary | ICD-10-CM | POA: Diagnosis not present

## 2019-02-07 MED ORDER — IOHEXOL 300 MG/ML  SOLN
100.0000 mL | Freq: Once | INTRAMUSCULAR | Status: AC | PRN
  Administered 2019-02-07: 100 mL via INTRAVENOUS

## 2019-02-08 ENCOUNTER — Other Ambulatory Visit: Payer: Self-pay | Admitting: Hematology and Oncology

## 2019-02-08 DIAGNOSIS — C6212 Malignant neoplasm of descended left testis: Secondary | ICD-10-CM

## 2019-02-11 NOTE — Progress Notes (Signed)
[facility]  [address], [address] [place], [place] [address] Phone: [phone]  Fax: [phone]  Office Visit:  02/13/2019  Referring physician: Valerie Roys, DO  Chief Complaint: Edgar [name] is a 28 y.o. male with recurrent testicular cancer who is seen for 5 month assessment and review of interval imaging studies.   HPI: The patient was last seen in the medical oncology clinic on 10/05/2018. At that time, he was doing well.  He had lost weight with a change in his diet.  Exam reveals no evidence of recurrent disease.  AFP, beta-HCG, and LDH were normal.   He was scheduled for a chest, abdomen, pelvic CT between 10/28/2018 - 11/19/2018.  He did not follow-up.  Chest, abdomen, and pelvis CT with contrast on 02/07/2019 revealed no acute intrathoracic, abdominal, or pelvic pathology.  There was no evidence of metastatic disease in the chest.  There was a single enlarged left external iliac chain lymph node similar in  size to the prior CT with no other adenopathy. There was interval resolution of the postoperative changes of the left groin with linear scarring with no fluid collection.  CBC on 01/30/2019 revealed a hematocrit 41.6, hemoglobin 13.6, MCV 92.2, platelets 288,000, WBC 6800, and ANC 4700. Beta hCG was <1.  AFP was 1.2. LDH was 129.  Patient was scheduled for telemedicine visit today and instead came into the office for in person visit.   During the interim, he's been "good". His health has been good. He keeps his feet wrapped to keep the swelling down.   Symptomatically, he feels nervous coming in today awaiting results from scans. He is taking his Xarelto and he needs a refill. He would like to get his port taken.  He doesn't have PCP   Past Medical History:  Diagnosis Date   Asthma    AS A CHILD-NO INHALERS   Cancer ([place])    testicular cancer 11/2016 per pt    DVT (deep venous thrombosis) (HCC)    GERD (gastroesophageal  reflux disease)    TUMS PRN    Past Surgical History:  Procedure Laterality Date   ANKLE FRACTURE SURGERY Right 2004   FINE NEEDLE ASPIRATION BIOPSY N/A 11/09/2017   Procedure: FINE NEEDLE ASPIRATION BIOPSY;  Surgeon: [name], MD;  Location: [facility];  Service: Cardiovascular;  Laterality: N/A;   IVC FILTER INSERTION     IVC FILTER INSERTION N/A 04/12/2018   Procedure: IVC FILTER INSERTION;  Surgeon: [name], MD;  Location: [facility];  Service: Cardiovascular;  Laterality: N/A;   IVC FILTER REMOVAL N/A 03/23/2018   Procedure: IVC FILTER REMOVAL;  Surgeon: [name], MD;  Location: [facility];  Service: Cardiovascular;  Laterality: N/A;   ORCHIECTOMY Left 11/26/2015   Procedure: ORCHIECTOMY;  Surgeon: [name], MD;  Location: [facility];  Service: Urology;  Laterality: Left;  radical/Inguinal approach   PERIPHERAL VASCULAR CATHETERIZATION N/A 12/16/2015   Procedure: [name] Cath Insertion;  Surgeon: [name], MD;  Location: [facility];  Service: Cardiovascular;  Laterality: N/A;   PERIPHERAL VASCULAR THROMBECTOMY Left 10/13/2017   Procedure: PERIPHERAL VASCULAR THROMBECTOMY;  Surgeon: [name], MD;  Location: [facility];  Service: Cardiovascular;  Laterality: Left;   WISDOM TOOTH EXTRACTION  2017    Family History  Problem Relation Age of Onset   Skin cancer Mother    Breast cancer Maternal Grandmother    Colon cancer  Maternal Grandfather    Diabetes Maternal Grandfather    Emphysema Father    Mental illness Sister        anxiety   Stroke Paternal Grandmother    Prostate cancer Neg Hx    Kidney cancer Neg Hx    Bladder Cancer Neg Hx     Social History:  reports that he has been smoking cigarettes. He has a 4.00 pack-year smoking history. He has quit using smokeless tobacco.  His smokeless tobacco use included snuff. He reports previous drug use. Drug: Marijuana. He  reports that he does not drink alcohol. He smokes 1/2 pack a day. He smokes marijuana. 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 The patient's mother's cell phone number is [phone]. Another contact number is [phone]. The patient lives in [place]. He started working at [employer].  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  The patient is alone today.  Allergies: No Known Allergies  Current Medications: Current Outpatient Medications  Medication Sig Dispense Refill   acetaminophen (TYLENOL) 325 MG tablet Take 650 mg by mouth every 6 (six) hours as needed.     rivaroxaban (XARELTO) 20 MG TABS tablet Take 1 tablet (20 mg total) by mouth daily. 30 tablet 2   gabapentin (NEURONTIN) 300 MG capsule TAKE 1 CAPSULE BY MOUTH EVERYDAY AT BEDTIME     No current facility-administered medications for this visit.    Facility-Administered Medications Ordered in Other Visits  Medication Dose Route Frequency Provider Last Rate Last Dose   filgrastim-sndz (ZARXIO) injection 480 mcg  480 mcg Subcutaneous Once Ambur Province, Drue Second, MD       filgrastim-sndz [facility]) injection 480 mcg  480 mcg Subcutaneous Once [name], MD        Review of Systems  Constitutional: Negative.  Negative for chills, diaphoresis, fever, malaise/fatigue and weight loss (15 lbs since last visit).       Feels "good".   HENT: Negative.  Negative for congestion, ear pain, hearing loss, nosebleeds, sinus pain and sore throat.   Eyes: Negative.  Negative for blurred vision, double vision, pain and discharge.  Respiratory: Negative.  Negative for cough, hemoptysis, sputum production and shortness of breath.   Cardiovascular: Positive for leg swelling (intermittent, left leg, feet and ankles). Negative for chest pain, palpitations, orthopnea and PND.  Gastrointestinal: Negative.  Negative for abdominal pain, blood in stool, constipation, diarrhea, heartburn, melena,  nausea and vomiting.  Genitourinary: Negative.  Negative for dysuria, frequency, hematuria and urgency.  Musculoskeletal: Negative.  Negative for back pain, falls, joint pain, myalgias and neck pain.  Skin: Negative.  Negative for itching and rash.  Neurological: Positive for sensory change (neuropathy in fingertips (sometimes) and feet (chronic)). Negative for dizziness, tremors, speech change, focal weakness, weakness and headaches.  Endo/Heme/Allergies: Does not bruise/bleed easily (on Xarelto).  Psychiatric/Behavioral: Negative for depression, memory loss and suicidal ideas. The patient is nervous/anxious. The patient does not have insomnia.   All other systems reviewed and are negative.  Performance status (ECOG):  1  Vitals Blood pressure (!) 130/93, pulse (!) 102, temperature (!) 97.3 F (36.3 C), temperature source Tympanic, resp. rate 16, weight 257 lb 11.5 oz (116.9 kg), SpO2 100 %.   Physical Exam  Constitutional: He is oriented to person, place, and time. He appears well-developed and well-nourished. No distress.  HENT:  Head: Normocephalic and atraumatic.  Mouth/Throat: Oropharynx is clear and moist. No oropharyngeal exudate.  Black cap.  Mask.  Brown hair and beard.  Eyes: Pupils are equal, round, and reactive to light. Conjunctivae and EOM are normal. No scleral icterus.  Neck: Normal range of motion. Neck supple. No JVD present.  Cardiovascular: Normal rate, regular rhythm and normal heart sounds.  No murmur heard. Pulmonary/Chest: Effort normal and breath sounds normal. No respiratory distress. He has no wheezes. He has no rales.  Abdominal: Soft. Bowel sounds are normal. He exhibits no distension and no mass. There is no abdominal tenderness. There is no rebound and no guarding.  Genitourinary:    Testes and penis normal.  Right testis shows no mass, no swelling and no tenderness.    Genitourinary Comments: s/p left orchiectomy; no masses.   Musculoskeletal: Normal  range of motion.        General: No tenderness or edema.  Lymphadenopathy:       Head (right side): No preauricular, no posterior auricular and no occipital adenopathy present.       Head (left side): No preauricular, no posterior auricular and no occipital adenopathy present.    He has no cervical adenopathy.    He has no axillary adenopathy.       Right: No inguinal and no supraclavicular adenopathy present.       Left: No inguinal and no supraclavicular adenopathy present.  Neurological: He is alert and oriented to person, place, and time.  Skin: Skin is warm and dry. No rash noted. He is not diaphoretic. No erythema. No pallor.  Psychiatric: He has a normal mood and affect. His behavior is normal. Judgment and thought content normal.  Nursing note and vitals reviewed.   No visits with results within 3 Day(s) from this visit.  Latest known visit with results is:  Infusion on 01/30/2019  Component Date Value Ref Range Status   hCG Quant 01/30/2019 <1  0 - 3 mIU/mL Final   Comment: (NOTE) Roche ECLIA methodology Performed At: [facility] [place], [place] [id number] Rush Farmer MD [id number]    AFP, Serum, Tumor Marker 01/30/2019 1.2  0.0 - 8.3 ng/mL Final   Comment: (NOTE) Roche Diagnostics Electrochemiluminescence Immunoassay (ECLIA) Values obtained with different assay methods or kits cannot be used interchangeably.  Results cannot be interpreted as absolute evidence of the presence or absence of malignant disease. This test is not interpretable in pregnant females. Performed At: [facility] [address] [place], [place] [id number] Rush Farmer MD [id number]    LDH 01/30/2019 129  98 - 192 U/L Final   Performed at [facility], [address] [place], [place] [address]   Sodium 01/30/2019 138  135 - 145 mmol/L Final   Potassium 01/30/2019 3.5  3.5 - 5.1 mmol/L Final   Chloride 01/30/2019 105  98 - 111 mmol/L  Final   CO2 01/30/2019 22  22 - 32 mmol/L Final   Glucose, Bld 01/30/2019 129* 70 - 99 mg/dL Final   BUN 01/30/2019 14  6 - 20 mg/dL Final   Creatinine, Ser 01/30/2019 1.02  0.61 - 1.24 mg/dL Final   Calcium 01/30/2019 9.2  8.9 - 10.3 mg/dL Final   Total Protein 01/30/2019 7.5  6.5 - 8.1 g/dL Final   Albumin 01/30/2019 4.7  3.5 - 5.0 g/dL Final   AST 01/30/2019 18  15 - 41 U/L Final   ALT 01/30/2019 17  0 - 44 U/L Final   Alkaline Phosphatase 01/30/2019 64  38 - 126 U/L Final   Total Bilirubin 01/30/2019 0.3  0.3 - 1.2 mg/dL Final   GFR calc non Af Amer 01/30/2019 >60  >60 mL/min Final   GFR calc Af Amer 01/30/2019 >60  >60 mL/min Final   Anion gap 01/30/2019 11  5 - 15 Final   Performed at [facility], [address] [place], [place] [address]   WBC 01/30/2019 6.8  4.0 - 10.5 K/uL Final   RBC 01/30/2019 4.51  4.22 - 5.81 MIL/uL Final   Hemoglobin 01/30/2019 13.6  13.0 - 17.0 g/dL Final   HCT 01/30/2019 41.6  39.0 - 52.0 % Final   MCV 01/30/2019 92.2  80.0 - 100.0 fL Final   MCH 01/30/2019 30.2  26.0 - 34.0 pg Final   MCHC 01/30/2019 32.7  30.0 - 36.0 g/dL Final   RDW 01/30/2019 12.9  11.5 - 15.5 % Final   Platelets 01/30/2019 288  150 - 400 K/uL Final   nRBC 01/30/2019 0.0  0.0 - 0.2 % Final   Neutrophils Relative % 01/30/2019 70  % Final   Neutro Abs 01/30/2019 4.7  1.7 - 7.7 K/uL Final   Lymphocytes Relative 01/30/2019 18  % Final   Lymphs Abs 01/30/2019 1.2  0.7 - 4.0 K/uL Final   Monocytes Relative 01/30/2019 8  % Final   Monocytes Absolute 01/30/2019 0.6  0.1 - 1.0 K/uL Final   Eosinophils Relative 01/30/2019 3  % Final   Eosinophils Absolute 01/30/2019 0.2  0.0 - 0.5 K/uL Final   Basophils Relative 01/30/2019 1  % Final   Basophils Absolute 01/30/2019 0.0  0.0 - 0.1 K/uL Final   Immature Granulocytes 01/30/2019 0  % Final   Abs Immature Granulocytes 01/30/2019 0.02  0.00 - 0.07 K/uL Final   Performed at [facility], [address] [place], [place] [address]    Assessment:  LI ALOI is a 28 y.o. male with recurrent testicular cancer. He presented with a left lower extremity DVT on 09/10/2017.  He initially presented withstage IIB left testicular cancers/p left radical orchiectomy on 11/26/2015. 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 Clinical/pathologic stage is T1 N1-2 S0 M0.  He received 3 cycles of BEP(12/30/2015 - 02/24/2016). He received OnPro Neulasta with cycle #2 and #3. He declined evaluation for retroperitoneal lymph node dissection (RPLND). He declined sperm banking.  Pre surgery labson 11/20/2015 revealed an AFP of 411.9, beta-HCG 2,669, and LDH 522 (121-224).   Tumor markers have been followed: AFP was 411.9 (0-8.3) on 11/20/2015, 11.2 on 12/19/2015, 6.3 on 12/23/2015, 1.3 on 01/27/2016, 1.8 on 02/17/2016, 1.9 on 03/30/2016, 1.4 on 05/27/2016, 1.0 on 07/29/2016, 1.4 on 10/14/2016, 3.3 on 12/24/2016, 1 on 09/10/2017, 1 on 09/20/2017, 2.6 on 10/21/2017, 2.1 on 11/16/2017, 2.1 on 12/20/2017, 2.1 on 02/04/2018, 1.3 on 10/05/2018, and 1.2 on 01/30/2019.  Beta-HCGwas 2,669 (0-3) on 11/20/2015, 2 on 12/19/2015, 1 on 12/23/2015, <1 on 01/27/2016, <1 on 02/17/2016, <1 on 03/30/2016, <1 on 05/27/2016, <1 on 07/29/2016, <1 on 10/14/2016, and 3 on 12/24/2016, <5 on 09/11/2017, <5 on 09/20/2017, <1 on 10/21/2017, <1 on 11/16/2017, <1 on 12/20/2017, < 1 on 02/04/2018, < 1 on 10/05/2018, and < 1 on 01/30/2019.  LDHwas 522 (98-192) on 11/20/2015, 179 on 12/19/2015, 160 on 12/23/2015, 202 on 01/27/2016, 156 on 02/10/2016, 161 on 02/17/2016, 172 on 03/30/2016, 178 on 05/27/2016, 150 on 07/29/2016, 152 on 10/14/2016, 145 on 12/24/2016, 1557 on 09/11/2017, 916 on 09/20/2017, 144 on  10/21/2017, 158 on 11/16/2017, 203 on 12/20/2017, 151 on 12/27/2017,  133 on 02/04/2018, 130 on 10/05/2018, and 129 on 01/30/2019.  Chest, abdomen, and pelvic CTon 09/11/2017 revealed enlarged and morphologically abnormal 4.3 x 4.9 cm left pelvic sidewall and retroperitoneal lymph nodes (measuring up to 2.9 cm). 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 There was anasarca/swelling of the left lower extremity. There was no evidence of metastatic disease in the chest.  Head MRIon 09/13/2017 revealed no evidence of metastatic disease.  He has a left lower extremity DVT. Bilateral lower extremity duplexon 09/10/2017 revealed evidence of obstruction in the left CFV, SFJ, and proximal femoral vein. He is on Xarelto.  He received 4 cycles of TIPchemotherapy(09/21/2017 - 12/27/2017). Cycle #1 was administered at [facility]. Cycles #2 and 3 were administered at [facility]. Both cycles 1 and 2 were complicated by lower extremity skin lesions on recovery of counts. 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 Patient received daily Zarxioinjections from 01/03/2018 - 01/12/2018.  PET scanon 02/03/2018 revealed left common and external iliac adenopathywithonly low-grade activity, less than that of the blood pool. The dominant lymph node is an external iliac node measuring 1.8 x 2.4cm(previously 2.3x 3.2cm).There was no new or enlarging adenopathy.There was low-grade activity along the left inguinal ring region near the orchectomy site, thought to be postoperative and not due to local malignancy.  He underwent robotic assisted laparoscopic retroperitoneal lymph node dissection and robotic inguinofemoral lymph node dissection on 05/19/2018 at [facility]  Pathology revealed a left common iliac node with a large necrotic tumor deposit consistent with treatment effect (no  residual viable tumor).  There were 3 interaortocaval lymph nodes, 9 paracaval lymph nodes, and 3 periaortic lymph nodes negative for malignancy.  Chest, abdomen, and pelvis CT with contrast on 02/07/2019 revealed no acute intrathoracic, abdominal, or pelvic pathology.  There was no evidence of metastatic disease in the chest.  There was a single enlarged left external iliac chain lymph node similar in  size to the prior CT with no other adenopathy. There was interval resolution of the postoperative changes of the left groin with linear scarring with no fluid collection.  Audiogramon 10/27/2017 revealed hearing within normal limits.  He underwent IVC filterplacement, percutaneous angioplastyand stentplacementof the left common iliac vein and left external iliac vein on 10/13/2017.He underwent an IVC filter removal on 03/23/2018 and replacement on 04/12/2018. He remains on Xarelto.   Hypercoagulable work-upon 10/12/2017 negative for the following:Factor V Leiden, prothrombin gene mutation,lupus anticoagulant, anticardiolipin antibodies, beta-2 glycoprotein, protein C activity/antigen, protein S activity/antigen. ATIIIwas54% (75-120) on heparin.  Symptomatically, he is doing well.  Exam is unremarkable.  Plan: 1.   Review labs from 01/30/2019. 2.   Recurrent testicular cancer Patient completed 4 cycles of TIP chemotherapy.   He underwent retroperitoneal lymph node dissection on 05/19/2018.             Pathology reviewed.  He had 1 necrotic lymph node with no viable tumor.             Chest, abdomen, pelvic CT scan on 02/07/2019 was personally reviewed.   No evidence of recurrent disease.   Significance of isolated left inguinal node unclear.    Review prior lymph node dissection with left common iliac node with necrotic tumor.    Discuss with radiology who compared prior PET scan and current films.     Node was  not hypermetabolic and is cystic.    Follow-up imaging in 6 months  per radiology.  Present at tumor board on 02/16/2019. 3.   LEFT lower extremity DVT Patient is a s/p replacement of the IVC filter. He is felt at risk for re-thrombosis. Continue Xarelto long term. Refill Xarelto until next appointment (dis: #30 with 2 refills).  4.   Chemotherapy-induced neuropathy Patient has a stable mild neuropathy in his feet. Continue to monitor. 5.   Health maintenance             Readdress obtaining primary care physician.   Assist with first visit at [facility] primary care.  Blood pressure elevated- recheck. 6.   Port flush every 6 weeks. 7.   RTC in 3 months for MD assessment and labs (CBC with diff, CMP, LDH, beta-HCG, AFP).  Addendum:  Patient presented at tumor board on 02/16/2019.  Continue surveillance.  Next CT scan in 6 months.  I discussed the assessment and treatment plan with the patient.  The patient was provided an opportunity to ask questions and all were answered.  The patient agreed with the plan and demonstrated an understanding of the instructions.  The patient was advised to call back if the symptoms worsen or if the condition fails to improve as anticipated.   [name], MD, PhD    02/13/2019, 2:48 PM  I, Samul Dada, am acting as a scribe for [name], MD.  I, Milton Mike Gip, MD, have reviewed the above documentation for accuracy and completeness, and I agree with the above.

## 2019-02-12 ENCOUNTER — Encounter: Payer: Self-pay | Admitting: Hematology and Oncology

## 2019-02-13 ENCOUNTER — Telehealth: Payer: Self-pay

## 2019-02-13 ENCOUNTER — Inpatient Hospital Stay (HOSPITAL_BASED_OUTPATIENT_CLINIC_OR_DEPARTMENT_OTHER): Payer: Medicaid Other | Admitting: Hematology and Oncology

## 2019-02-13 ENCOUNTER — Other Ambulatory Visit: Payer: Self-pay

## 2019-02-13 VITALS — BP 125/98 | HR 81 | Temp 97.3°F | Resp 16 | Wt 257.7 lb

## 2019-02-13 DIAGNOSIS — C6212 Malignant neoplasm of descended left testis: Secondary | ICD-10-CM | POA: Diagnosis not present

## 2019-02-13 DIAGNOSIS — I82412 Acute embolism and thrombosis of left femoral vein: Secondary | ICD-10-CM

## 2019-02-13 DIAGNOSIS — Z7901 Long term (current) use of anticoagulants: Secondary | ICD-10-CM

## 2019-02-13 DIAGNOSIS — Z7189 Other specified counseling: Secondary | ICD-10-CM

## 2019-02-13 DIAGNOSIS — Z86718 Personal history of other venous thrombosis and embolism: Secondary | ICD-10-CM

## 2019-02-13 DIAGNOSIS — R590 Localized enlarged lymph nodes: Secondary | ICD-10-CM

## 2019-02-13 MED ORDER — RIVAROXABAN 20 MG PO TABS: 20.0000 mg | ORAL_TABLET | Freq: Every day | ORAL | 2 refills | Status: DC

## 2019-02-13 NOTE — Telephone Encounter (Signed)
I reached out to all the numbers we have on file for this patient and no answer. Left a message To inform the patient that he have a doximity visit and we need to doe his assesment. No answer. Spoke with the girl friend early but she states he want be there until 12:45. I have tried to reach out to her number still unsuccessful by reaching the patient.

## 2019-02-13 NOTE — Progress Notes (Signed)
Patient here for follow up. Denies any concerns.  

## 2019-02-16 ENCOUNTER — Other Ambulatory Visit: Payer: Medicaid Other

## 2019-02-16 NOTE — Progress Notes (Signed)
Tumor Board Documentation  Edgar [name] was presented by Dr Mike Gip at our Tumor Board on 02/16/2019, which included representatives from medical oncology, radiation oncology, navigation, pathology, radiology, surgical, internal medicine, genetics, pulmonology, palliative care, research.  Edgar [name] currently presents as a current patient, for discussion with history of the following treatments: active survellience, adjuvant chemotherapy, surgical intervention(s).  Additionally, we reviewed previous medical and familial history, history of present illness, and recent lab results along with all available histopathologic and imaging studies. The tumor board considered available treatment options and made the following recommendations: Active surveillance    The following procedures/referrals were also placed: No orders of the defined types were placed in this encounter.   Clinical Trial Status: not discussed   Staging used:    National site-specific guidelines   were discussed with respect to the case.  Tumor board is a meeting of clinicians from various specialty areas who evaluate and discuss patients for whom a multidisciplinary approach is being considered. Final determinations in the plan of care are those of the provider(s). The responsibility for follow up of recommendations given during tumor board is that of the provider.   Today's extended care, comprehensive team conference, Edgar [name] was not present for the discussion and was not examined.   Multidisciplinary Tumor Board is a multidisciplinary case peer review process.  Decisions discussed in the Multidisciplinary Tumor Board reflect the opinions of the specialists present at the conference without having examined the patient.  Ultimately, treatment and diagnostic decisions rest with the primary provider(s) and the patient.

## 2019-02-19 DIAGNOSIS — R590 Localized enlarged lymph nodes: Secondary | ICD-10-CM | POA: Insufficient documentation

## 2019-03-24 ENCOUNTER — Other Ambulatory Visit: Payer: Self-pay

## 2019-03-27 ENCOUNTER — Inpatient Hospital Stay: Payer: Medicaid Other | Attending: Hematology and Oncology

## 2019-03-27 ENCOUNTER — Telehealth: Payer: Self-pay | Admitting: Hematology and Oncology

## 2019-03-27 ENCOUNTER — Other Ambulatory Visit: Payer: Self-pay

## 2019-03-27 DIAGNOSIS — I82412 Acute embolism and thrombosis of left femoral vein: Secondary | ICD-10-CM

## 2019-03-27 MED ORDER — RIVAROXABAN 20 MG PO TABS: 20.0000 mg | ORAL_TABLET | Freq: Every day | ORAL | 2 refills | Status: DC

## 2019-03-27 NOTE — Telephone Encounter (Signed)
Edgar [name] left a message that he is out of Xarelto. Please refill.

## 2019-05-04 NOTE — Progress Notes (Signed)
[facility]  [address] [place], [place] [address] Phone: [phone]  Fax: [phone]   Clinic Day:  05/08/2019  Referring physician: No ref. provider found  Chief Complaint: Edgar [name] is a 29 y.o. male with recurrent testicular cancer who is seen for a 3 month assessment.  HPI: The patient was last seen in the medical oncology clinic on 02/13/2019. At that time, he was doing well. Exam was unremarkable. CBC and CMP was normal.  LDH was 129. AFP was 1.2. Beta-HCG was <1.   Patient continued on Xarelto.   Tumor board recommended active surveillance on 02/16/2019.   During the interim, he has felt pretty good. He denies any bruising or bleeding on Xarelto.  He has occasional swelling in his ankles and feet. Neuropathy in his feet are the same. The neuropathy in his fingers are better.  He is wears steel toed shoes. The neuropathy is no better without his work shoes.    Past Medical History:  Diagnosis Date  . Asthma    AS A CHILD-NO INHALERS  . Cancer ([place])    testicular cancer 11/2016 per pt   . DVT (deep venous thrombosis) ([place])   . GERD (gastroesophageal reflux disease)    TUMS PRN    Past Surgical History:  Procedure Laterality Date  . ANKLE FRACTURE SURGERY Right 2004  . FINE NEEDLE ASPIRATION BIOPSY N/A 11/09/2017   Procedure: FINE NEEDLE ASPIRATION BIOPSY;  Surgeon: [name], MD;  Location: [facility];  Service: Cardiovascular;  Laterality: N/A;  . IVC FILTER INSERTION    . IVC FILTER INSERTION N/A 04/12/2018   Procedure: IVC FILTER INSERTION;  Surgeon: [name], MD;  Location: [facility];  Service: Cardiovascular;  Laterality: N/A;  . IVC FILTER REMOVAL N/A 03/23/2018   Procedure: IVC FILTER REMOVAL;  Surgeon: [name], MD;  Location: [facility];  Service: Cardiovascular;  Laterality: N/A;  . ORCHIECTOMY Left 11/26/2015   Procedure: ORCHIECTOMY;  Surgeon: [name], MD;  Location: [facility];  Service: Urology;  Laterality: Left;  radical/Inguinal approach  . PERIPHERAL VASCULAR CATHETERIZATION N/A 12/16/2015   Procedure: [name] Cath Insertion;  Surgeon: [name], MD;  Location: [facility];  Service: Cardiovascular;  Laterality: N/A;  . PERIPHERAL VASCULAR THROMBECTOMY Left 10/13/2017   Procedure: PERIPHERAL VASCULAR THROMBECTOMY;  Surgeon: [name], MD;  Location: [facility] [facility];  Service: Cardiovascular;  Laterality: Left;  . WISDOM TOOTH EXTRACTION  2017    Family History  Problem Relation Age of Onset  . Skin cancer Mother   . Breast cancer Maternal Grandmother   . Colon cancer Maternal Grandfather   . Diabetes Maternal Grandfather   . Emphysema Father   . Mental illness Sister        anxiety  . Stroke Paternal Grandmother   . Prostate cancer Neg Hx   . Kidney cancer Neg Hx   . Bladder Cancer Neg Hx     Social History:  reports that he has been smoking cigarettes. He has a 4.00 pack-year smoking history. He has quit using smokeless tobacco.  His smokeless tobacco use included snuff. He reports previous drug use. Drug: Marijuana. He reports that he does not drink alcohol. He smokes 1/2 pack a day. He smokes marijuana. 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 His daughter is home schooled. The patient's mother's cell phone number is [phone]. Another contact number is [phone]. The patient lives in [place]. He started working at [employer] 40 hours a week.  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 The patient is alone today.  Allergies: No Known Allergies  Current Medications: Current Outpatient Medications  Medication Sig Dispense Refill  . gabapentin (NEURONTIN) 300 MG capsule TAKE 1 CAPSULE BY MOUTH EVERYDAY AT BEDTIME    . rivaroxaban (XARELTO) 20 MG TABS tablet Take 1 tablet (20 mg total) by mouth daily. 30 tablet 2  . acetaminophen (TYLENOL) 325 MG  tablet Take 650 mg by mouth every 6 (six) hours as needed.     No current facility-administered medications for this visit.   Facility-Administered Medications Ordered in Other Visits  Medication Dose Route Frequency Provider Last Rate Last Admin  . filgrastim-sndz (ZARXIO) injection 480 mcg  480 mcg Subcutaneous Once [name], MD      . Karle Barr [facility]) injection 480 mcg  480 mcg Subcutaneous Once [name], MD        Review of Systems  Constitutional: Negative.  Negative for chills, diaphoresis, fever, malaise/fatigue and weight loss (up 2 lbs).       Feels "pretty good".  HENT: Negative.  Negative for congestion, ear pain, hearing loss, nosebleeds, sinus pain and sore throat.   Eyes: Negative.  Negative for blurred vision, double vision, pain and discharge.  Respiratory: Negative.  Negative for cough, hemoptysis, sputum production and shortness of breath.   Cardiovascular: Positive for leg swelling (intermittent left leg, feet and ankles). Negative for chest pain, palpitations, orthopnea and PND.  Gastrointestinal: Negative.  Negative for abdominal pain, blood in stool, constipation, diarrhea, heartburn, melena, nausea and vomiting.  Genitourinary: Negative.  Negative for dysuria, frequency, hematuria and urgency.  Musculoskeletal: Negative.  Negative for back pain, falls, joint pain, myalgias and neck pain.  Skin: Negative.  Negative for itching and rash.  Neurological: Positive for sensory change (neuropathy in fingertips is better; neuropathy in feet is unchanged). Negative for dizziness, tremors, speech change, focal weakness, weakness and headaches.  Endo/Heme/Allergies: Bruises/bleeds easily (on Xarelto).  Psychiatric/Behavioral: Negative for depression, memory loss and suicidal ideas. The patient is not nervous/anxious and does not have insomnia.   All other systems reviewed and are negative.  Performance status (ECOG): 0  Vitals Blood pressure  138/76, pulse 100, temperature 97.8 F (36.6 C), temperature source Tympanic, resp. rate 18, height 6\' 3"  (1.905 m), weight 259 lb 2.4 oz (117.5 kg), SpO2 100 %.   Physical Exam  Constitutional: He is oriented to person, place, and time. He appears well-developed and well-nourished. No distress.  HENT:  Head: Normocephalic and atraumatic.  Mouth/Throat: Oropharynx is clear and moist. No oropharyngeal exudate.  Black cap.  Mask.  Brown hair and beard.  Eyes: Pupils are equal, round, and reactive to light. Conjunctivae and EOM are normal. No scleral icterus.  Neck: No JVD present.  Cardiovascular: Normal rate, regular rhythm and normal heart sounds.  No murmur heard. Pulmonary/Chest: Effort normal and breath sounds normal. No respiratory distress. He has no wheezes. He has no rales.  Abdominal: Soft. Bowel sounds are normal. He exhibits no distension and no mass. There is no abdominal tenderness. There is no rebound and no guarding.  Genitourinary:    Penis normal.     Genitourinary Comments: No testicular masses.   Musculoskeletal:        General: No tenderness or edema. Normal range of motion.     Cervical back: Normal range of  motion and neck supple.  Lymphadenopathy:       Head (right side): No preauricular, no posterior auricular and no occipital adenopathy present.       Head (left side): No preauricular, no posterior auricular and no occipital adenopathy present.    He has no cervical adenopathy.    He has no axillary adenopathy.       Right: No inguinal and no supraclavicular adenopathy present.       Left: No inguinal and no supraclavicular adenopathy present.  Neurological: He is alert and oriented to person, place, and time.  Skin: Skin is warm and dry. No rash noted. He is not diaphoretic. No erythema. No pallor.  Psychiatric: He has a normal mood and affect. His behavior is normal. Judgment and thought content normal.  Nursing note and vitals reviewed.   Infusion on  05/08/2019  Component Date Value Ref Range Status  . WBC 05/08/2019 6.6  4.0 - 10.5 K/uL Final  . RBC 05/08/2019 4.73  4.22 - 5.81 MIL/uL Final  . Hemoglobin 05/08/2019 14.2  13.0 - 17.0 g/dL Final  . HCT 05/08/2019 44.3  39.0 - 52.0 % Final  . MCV 05/08/2019 93.7  80.0 - 100.0 fL Final  . MCH 05/08/2019 30.0  26.0 - 34.0 pg Final  . MCHC 05/08/2019 32.1  30.0 - 36.0 g/dL Final  . RDW 05/08/2019 12.6  11.5 - 15.5 % Final  . Platelets 05/08/2019 309  150 - 400 K/uL Final  . nRBC 05/08/2019 0.0  0.0 - 0.2 % Final  . Neutrophils Relative % 05/08/2019 70  % Final  . Neutro Abs 05/08/2019 4.6  1.7 - 7.7 K/uL Final  . Lymphocytes Relative 05/08/2019 17  % Final  . Lymphs Abs 05/08/2019 1.1  0.7 - 4.0 K/uL Final  . Monocytes Relative 05/08/2019 8  % Final  . Monocytes Absolute 05/08/2019 0.5  0.1 - 1.0 K/uL Final  . Eosinophils Relative 05/08/2019 3  % Final  . Eosinophils Absolute 05/08/2019 0.2  0.0 - 0.5 K/uL Final  . Basophils Relative 05/08/2019 1  % Final  . Basophils Absolute 05/08/2019 0.1  0.0 - 0.1 K/uL Final  . Immature Granulocytes 05/08/2019 1  % Final  . Abs Immature Granulocytes 05/08/2019 0.04  0.00 - 0.07 K/uL Final   Performed at [facility], [address] [place], [place] [address]  . LDH 05/08/2019 147  98 - 192 U/L Final   Performed at [facility], [address], [place], [place] [address]  . Sodium 05/08/2019 137  135 - 145 mmol/L Final  . Potassium 05/08/2019 3.9  3.5 - 5.1 mmol/L Final  . Chloride 05/08/2019 104  98 - 111 mmol/L Final  . CO2 05/08/2019 26  22 - 32 mmol/L Final  . Glucose, Bld 05/08/2019 91  70 - 99 mg/dL Final  . BUN 05/08/2019 12  6 - 20 mg/dL Final  . Creatinine, Ser 05/08/2019 0.96  0.61 - 1.24 mg/dL Final  . Calcium 05/08/2019 9.0  8.9 - 10.3 mg/dL Final  . Total Protein 05/08/2019 7.5  6.5 - 8.1 g/dL Final  . Albumin 05/08/2019 4.6  3.5 - 5.0 g/dL Final  . AST 05/08/2019 20  15 - 41 U/L Final  . ALT 05/08/2019  20  0 - 44 U/L Final  . Alkaline Phosphatase 05/08/2019 58  38 - 126 U/L Final  . Total Bilirubin 05/08/2019 0.7  0.3 - 1.2 mg/dL Final  . GFR calc non Af  Amer 05/08/2019 >60  >60 mL/min Final  . GFR calc Af Amer 05/08/2019 >60  >60 mL/min Final  . Anion gap 05/08/2019 7  5 - 15 Final   Performed at [facility], [address], [place], [place] [address]    Assessment:  Edgar [name] is a 29 y.o. male with recurrent testicular cancer. He presented with a left lower extremity DVT on 09/10/2017.  He initially presented withstage IIB left testicular cancers/p left radical orchiectomy on 11/26/2015. 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 Clinical/pathologic stage is T1 N1-2 S0 M0.  He received 3 cycles of BEP(12/30/2015 - 02/24/2016). He received OnPro Neulasta with cycle #2 and #3. He declined evaluation for retroperitoneal lymph node dissection (RPLND). He declined sperm banking.  Pre surgery labson 11/20/2015 revealed an AFP of 411.9, beta-HCG 2,669, and LDH 522 (121-224).   Tumor markers have been followed: AFP was 411.9 (0-8.3) on 11/20/2015, 11.2 on 12/19/2015, 6.3 on 12/23/2015, 1.3 on 01/27/2016, 1.8 on 02/17/2016, 1.9 on 03/30/2016, 1.4 on 05/27/2016, 1.0 on 07/29/2016, 1.4 on 10/14/2016, 3.3 on 12/24/2016, 1 on 09/10/2017, 1 on 09/20/2017, 2.6 on 10/21/2017, 2.1 on 11/16/2017, 2.1 on 12/20/2017, 2.1 on 02/04/2018, 1.3 on 10/05/2018, 1.2 on 01/30/2019, and 1.2 on 05/08/2019.  Beta-HCGwas 2,669 (0-3) on 11/20/2015, 2 on 12/19/2015, 1 on 12/23/2015, <1 on 01/27/2016, <1 on 02/17/2016, <1 on 03/30/2016, <1 on 05/27/2016, <1 on 07/29/2016, <1 on 10/14/2016, and 3 on 12/24/2016, <5 on 09/11/2017, <5 on 09/20/2017, <1 on 10/21/2017, <1 on 11/16/2017, <1 on 12/20/2017, <1 on 02/04/2018, < 1 on 10/05/2018, < 1 on 01/30/2019, and  < 1 on 05/08/2019.  LDHwas 522 (98-192) on 11/20/2015, 179 on 12/19/2015, 160 on 12/23/2015, 202 on 01/27/2016, 156 on 02/10/2016, 161 on 02/17/2016, 172 on 03/30/2016, 178 on 05/27/2016, 150 on 07/29/2016, 152 on 10/14/2016, 145 on 12/24/2016, 1557 on 09/11/2017, 916 on 09/20/2017, 144 on 10/21/2017, 158 on 11/16/2017, 203 on 12/20/2017, 151 on 12/27/2017, 133 on 02/04/2018,130 on 10/05/2018, 129 on 01/30/2019, 147 on 05/08/2019.  Chest, abdomen, and pelvic CTon 09/11/2017 revealed enlarged and morphologically abnormal 4.3 x 4.9 cm left pelvic sidewall and retroperitoneal lymph nodes (measuring up to 2.9 cm). 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 There was anasarca/swelling of the left lower extremity. There was no evidence of metastatic disease in the chest.  Head MRIon 09/13/2017 revealed no evidence of metastatic disease.  He has a left lower extremity DVT. Bilateral lower extremity duplexon 09/10/2017 revealed evidence of obstruction in the left CFV, SFJ, and proximal femoral vein. He is on Xarelto.  He received 4 cycles of TIPchemotherapy(09/21/2017 - 12/27/2017). Cycle #1 was administered at [facility]. Cycles #2 and 3 were administered at [facility]. Both cycles 1 and 2 were complicated by lower extremity skin lesions on recovery of counts. 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 Patient received daily Zarxioinjections from 01/03/2018 - 01/12/2018.  PET scanon 02/03/2018 revealed left common and external iliac adenopathywithonly low-grade activity, less than that of the blood pool. The dominant lymph node is an external iliac node measuring 1.8 x 2.4cm(previously 2.3x 3.2cm).There was no new or enlarging adenopathy.There was low-grade activity along the left inguinal  ring region near the orchectomy site, thought to be postoperative and not due to local malignancy.  He underwentrobotic assisted  laparoscopic retroperitoneal lymph node dissectionand robotic inguinofemoral lymph node dissection on 03/05/2020at [facility]. Pathologyrevealed a left common iliac node with a large necrotic tumor deposit consistent with treatment effect (no residual viable tumor). There were 3 interaortocaval lymph nodes,9 paracaval lymph nodes, and3 periaortic lymph nodes negative for malignancy.  Chest, abdomen, and pelvis CT with contrast on 02/07/2019 revealed no acute intrathoracic, abdominal, or pelvic pathology.  There was no evidence of metastatic disease in the chest.  There was a single enlarged left external iliac chain lymph node similar in  size to the prior CT with no other adenopathy. There was interval resolution of the postoperative changes of the left groin with linear scarring with no fluid collection.  Audiogramon 10/27/2017 revealed hearing within normal limits.  He underwent IVC filterplacement, percutaneous angioplastyand stentplacementof the left common iliac vein and left external iliac vein on 10/13/2017.He underwent an IVC filter removalon 01/08/2020and replacementon 04/12/2018. He remains on Xarelto.  Hypercoagulable work-upon 10/12/2017 negative for the following:Factor V Leiden, prothrombin gene mutation,lupus anticoagulant, anticardiolipin antibodies, beta-2 glycoprotein, protein C activity/antigen, protein S activity/antigen. ATIIIwas54% (75-120) on heparin.  Symptomatically, he is doing well.  Exam reveals no testicular masses or adenopathy.  Plan: 1.   Labs today: CBC with diff, CMP, LDH, beta-HCG, AFP. 2.Recurrent testicular cancer Patient completed 4 cycles of TIP chemotherapy.  He underwent retroperitoneal lymph node dissection on03/07/2018. He had 1 necrotic lymph node with no viable  tumor. Chest, abdomen, pelvic CT scan on 02/07/2019 revealed no evidence of recurrent disease.                         Significance of isolated left inguinal node unclear.                                     Review prior lymph node dissection with left common iliac node with necrotic tumor.                                     Discuss with radiology who compared prior PET scan and current films.                                                 Node was not hypermetabolic and is cystic.                                     Follow-up imaging in 6 months per radiology.             Review tumor board discussions from 02/16/2019.  Discuss plans for imaging in 3 months. 3. LEFT lower extremity DVT Patient is as/preplacement of the IVC filter. He isfelt atrisk for re-thrombosis. Continue Xarelto long-term. 4.Chemotherapy-induced neuropathy Patient has a stable mild neuropathy in his feet. Continue to monitor. 5.   Chest, abdomen, and pelvis CT on 08/07/2019. 6.   RTC in 3 months for MD assessment, labs (CBC with diff, CMP, LDH, beta-HCG, AFP- can be done before CT), and review of CT scans.  I discussed the assessment and treatment plan with the patient.  The patient was provided an opportunity  to ask questions and all were answered.  The patient agreed with the plan and demonstrated an understanding of the instructions.  The patient was advised to call back if the symptoms worsen or if the condition fails to improve as anticipated.   [name], MD, PhD    05/08/2019, 2:40 PM  I, Selena Batten, am acting as scribe for [employer]. Mike Gip, MD, PhD.  I, Leighton Luster C. Mike Gip, MD, have reviewed the above documentation for accuracy and completeness, and I agree with the above.

## 2019-05-07 ENCOUNTER — Other Ambulatory Visit: Payer: Self-pay | Admitting: Hematology and Oncology

## 2019-05-07 DIAGNOSIS — T451X5A Adverse effect of antineoplastic and immunosuppressive drugs, initial encounter: Secondary | ICD-10-CM | POA: Insufficient documentation

## 2019-05-07 DIAGNOSIS — G620 Drug-induced polyneuropathy: Secondary | ICD-10-CM | POA: Insufficient documentation

## 2019-05-07 DIAGNOSIS — C6212 Malignant neoplasm of descended left testis: Secondary | ICD-10-CM

## 2019-05-08 ENCOUNTER — Inpatient Hospital Stay: Payer: Medicaid Other

## 2019-05-08 ENCOUNTER — Inpatient Hospital Stay: Payer: Medicaid Other | Attending: Hematology and Oncology | Admitting: Hematology and Oncology

## 2019-05-08 ENCOUNTER — Encounter: Payer: Self-pay | Admitting: Hematology and Oncology

## 2019-05-08 ENCOUNTER — Other Ambulatory Visit: Payer: Self-pay

## 2019-05-08 VITALS — BP 138/76 | HR 100 | Temp 97.8°F | Resp 18 | Ht 75.0 in | Wt 259.2 lb

## 2019-05-08 DIAGNOSIS — Z7901 Long term (current) use of anticoagulants: Secondary | ICD-10-CM | POA: Insufficient documentation

## 2019-05-08 DIAGNOSIS — J45909 Unspecified asthma, uncomplicated: Secondary | ICD-10-CM | POA: Diagnosis not present

## 2019-05-08 DIAGNOSIS — F17210 Nicotine dependence, cigarettes, uncomplicated: Secondary | ICD-10-CM | POA: Diagnosis not present

## 2019-05-08 DIAGNOSIS — Z86718 Personal history of other venous thrombosis and embolism: Secondary | ICD-10-CM | POA: Insufficient documentation

## 2019-05-08 DIAGNOSIS — C6212 Malignant neoplasm of descended left testis: Secondary | ICD-10-CM

## 2019-05-08 DIAGNOSIS — C6292 Malignant neoplasm of left testis, unspecified whether descended or undescended: Secondary | ICD-10-CM | POA: Diagnosis not present

## 2019-05-08 DIAGNOSIS — T451X5A Adverse effect of antineoplastic and immunosuppressive drugs, initial encounter: Secondary | ICD-10-CM | POA: Insufficient documentation

## 2019-05-08 DIAGNOSIS — G620 Drug-induced polyneuropathy: Secondary | ICD-10-CM | POA: Insufficient documentation

## 2019-05-08 DIAGNOSIS — K219 Gastro-esophageal reflux disease without esophagitis: Secondary | ICD-10-CM | POA: Insufficient documentation

## 2019-05-08 DIAGNOSIS — Z79899 Other long term (current) drug therapy: Secondary | ICD-10-CM | POA: Insufficient documentation

## 2019-05-08 DIAGNOSIS — I82512 Chronic embolism and thrombosis of left femoral vein: Secondary | ICD-10-CM | POA: Diagnosis not present

## 2019-05-08 DIAGNOSIS — Z7189 Other specified counseling: Secondary | ICD-10-CM

## 2019-05-08 LAB — CBC WITH DIFFERENTIAL/PLATELET
Abs Immature Granulocytes: 0.04 10*3/uL (ref 0.00–0.07)
Basophils Absolute: 0.1 10*3/uL (ref 0.0–0.1)
Basophils Relative: 1 %
Eosinophils Absolute: 0.2 10*3/uL (ref 0.0–0.5)
Eosinophils Relative: 3 %
HCT: 44.3 % (ref 39.0–52.0)
Hemoglobin: 14.2 g/dL (ref 13.0–17.0)
Immature Granulocytes: 1 %
Lymphocytes Relative: 17 %
Lymphs Abs: 1.1 10*3/uL (ref 0.7–4.0)
MCH: 30 pg (ref 26.0–34.0)
MCHC: 32.1 g/dL (ref 30.0–36.0)
MCV: 93.7 fL (ref 80.0–100.0)
Monocytes Absolute: 0.5 10*3/uL (ref 0.1–1.0)
Monocytes Relative: 8 %
Neutro Abs: 4.6 10*3/uL (ref 1.7–7.7)
Neutrophils Relative %: 70 %
Platelets: 309 10*3/uL (ref 150–400)
RBC: 4.73 MIL/uL (ref 4.22–5.81)
RDW: 12.6 % (ref 11.5–15.5)
WBC: 6.6 10*3/uL (ref 4.0–10.5)
nRBC: 0 % (ref 0.0–0.2)

## 2019-05-08 LAB — COMPREHENSIVE METABOLIC PANEL WITH GFR
ALT: 20 U/L (ref 0–44)
AST: 20 U/L (ref 15–41)
Albumin: 4.6 g/dL (ref 3.5–5.0)
Alkaline Phosphatase: 58 U/L (ref 38–126)
Anion gap: 7 (ref 5–15)
BUN: 12 mg/dL (ref 6–20)
CO2: 26 mmol/L (ref 22–32)
Calcium: 9 mg/dL (ref 8.9–10.3)
Chloride: 104 mmol/L (ref 98–111)
Creatinine, Ser: 0.96 mg/dL (ref 0.61–1.24)
GFR calc Af Amer: >60 mL/min
GFR calc non Af Amer: >60 mL/min
Glucose, Bld: 91 mg/dL (ref 70–99)
Potassium: 3.9 mmol/L (ref 3.5–5.1)
Sodium: 137 mmol/L (ref 135–145)
Total Bilirubin: 0.7 mg/dL (ref 0.3–1.2)
Total Protein: 7.5 g/dL (ref 6.5–8.1)

## 2019-05-08 LAB — LACTATE DEHYDROGENASE: LDH: 147 U/L (ref 98–192)

## 2019-05-08 MED ORDER — HEPARIN SOD (PORK) LOCK FLUSH 100 UNIT/ML IV SOLN
500.0000 [IU] | Freq: Once | INTRAVENOUS | Status: AC
  Administered 2019-05-08: 14:00:00 500 [IU] via INTRAVENOUS
  Filled 2019-05-08: qty 5

## 2019-05-08 MED ORDER — GABAPENTIN 300 MG PO CAPS: ORAL_CAPSULE | ORAL | 2 refills | Status: DC

## 2019-05-08 MED ORDER — SODIUM CHLORIDE 0.9% FLUSH
10.0000 mL | INTRAVENOUS | Status: DC | PRN
  Administered 2019-05-08: 14:00:00 10 mL via INTRAVENOUS
  Filled 2019-05-08: qty 10

## 2019-05-08 NOTE — Progress Notes (Signed)
No new changes noted today 

## 2019-05-09 LAB — BETA HCG QUANT (REF LAB): hCG Quant: <1 m[IU]/mL (ref 0–3)

## 2019-05-09 LAB — AFP TUMOR MARKER: AFP, Serum, Tumor Marker: 1.2 ng/mL (ref 0.0–8.3)

## 2019-08-07 ENCOUNTER — Ambulatory Visit
Admission: RE | Admit: 2019-08-07 | Discharge: 2019-08-07 | Disposition: A | Discharge status: Home / Self Care | Payer: Medicaid Other | Source: Ambulatory Visit | Attending: Hematology and Oncology | Admitting: Hematology and Oncology

## 2019-08-07 ENCOUNTER — Other Ambulatory Visit: Payer: Self-pay

## 2019-08-07 ENCOUNTER — Inpatient Hospital Stay: Payer: Medicaid Other | Attending: Hematology and Oncology

## 2019-08-07 DIAGNOSIS — C6292 Malignant neoplasm of left testis, unspecified whether descended or undescended: Secondary | ICD-10-CM | POA: Insufficient documentation

## 2019-08-07 DIAGNOSIS — C6212 Malignant neoplasm of descended left testis: Secondary | ICD-10-CM | POA: Diagnosis present

## 2019-08-07 LAB — COMPREHENSIVE METABOLIC PANEL
ALT: 27 U/L (ref 0–44)
AST: 23 U/L (ref 15–41)
Albumin: 4.2 g/dL (ref 3.5–5.0)
Alkaline Phosphatase: 57 U/L (ref 38–126)
Anion gap: 8 (ref 5–15)
BUN: 10 mg/dL (ref 6–20)
CO2: 25 mmol/L (ref 22–32)
Calcium: 8.8 mg/dL — ABNORMAL LOW (ref 8.9–10.3)
Chloride: 105 mmol/L (ref 98–111)
Creatinine, Ser: 1.08 mg/dL (ref 0.61–1.24)
GFR calc Af Amer: >60 mL/min (ref >60)
GFR calc non Af Amer: >60 mL/min (ref >60)
Glucose, Bld: 138 mg/dL — ABNORMAL HIGH (ref 70–99)
Potassium: 3.9 mmol/L (ref 3.5–5.1)
Sodium: 138 mmol/L (ref 135–145)
Total Bilirubin: 0.5 mg/dL (ref 0.3–1.2)
Total Protein: 7.3 g/dL (ref 6.5–8.1)

## 2019-08-07 LAB — CBC WITH DIFFERENTIAL/PLATELET
Abs Immature Granulocytes: 0.04 10*3/uL (ref 0.00–0.07)
Basophils Absolute: 0.1 10*3/uL (ref 0.0–0.1)
Basophils Relative: 1 %
Eosinophils Absolute: 0.3 10*3/uL (ref 0.0–0.5)
Eosinophils Relative: 6 %
HCT: 43.9 % (ref 39.0–52.0)
Hemoglobin: 14.1 g/dL (ref 13.0–17.0)
Immature Granulocytes: 1 %
Lymphocytes Relative: 21 %
Lymphs Abs: 1.1 10*3/uL (ref 0.7–4.0)
MCH: 29.7 pg (ref 26.0–34.0)
MCHC: 32.1 g/dL (ref 30.0–36.0)
MCV: 92.4 fL (ref 80.0–100.0)
Monocytes Absolute: 0.3 10*3/uL (ref 0.1–1.0)
Monocytes Relative: 6 %
Neutro Abs: 3.5 10*3/uL (ref 1.7–7.7)
Neutrophils Relative %: 65 %
Platelets: 360 10*3/uL (ref 150–400)
RBC: 4.75 MIL/uL (ref 4.22–5.81)
RDW: 13 % (ref 11.5–15.5)
WBC: 5.4 10*3/uL (ref 4.0–10.5)
nRBC: 0 % (ref 0.0–0.2)

## 2019-08-07 LAB — LACTATE DEHYDROGENASE: LDH: 142 U/L (ref 98–192)

## 2019-08-07 MED ORDER — IOHEXOL 300 MG/ML  SOLN
100.0000 mL | Freq: Once | INTRAMUSCULAR | Status: AC | PRN
  Administered 2019-08-07: 100 mL via INTRAVENOUS

## 2019-08-07 MED ORDER — HEPARIN SOD (PORK) LOCK FLUSH 100 UNIT/ML IV SOLN
500.0000 [IU] | Freq: Once | INTRAVENOUS | Status: AC
  Administered 2019-08-07: 500 [IU] via INTRAVENOUS
  Filled 2019-08-07: qty 5

## 2019-08-08 ENCOUNTER — Encounter: Payer: Self-pay | Admitting: Hematology and Oncology

## 2019-08-08 LAB — AFP TUMOR MARKER: AFP, Serum, Tumor Marker: 1.4 ng/mL (ref 0.0–8.3)

## 2019-08-08 NOTE — Progress Notes (Unsigned)
Patient was called for pre assessment. He denied any new concerns or pain. He states he is currently needed a refill of gabapentin and xarelto.

## 2019-08-08 NOTE — Progress Notes (Incomplete)
[facility]  [address] [place], [place] [address] Phone: [phone]  Fax: [phone]   Clinic Day:  08/08/2019  Referring physician: No ref. provider found  Chief Complaint: Edgar [name] is a 29 y.o. male with recurrent testicular cancer who is seen for a 3 month assessment.  HPI: The patient was last seen in the medical oncology clinic on 05/08/2019. At that time,  he was doing well.  Exam revealed no testicular masses or adenopathy. Hematocrit was 44.3, hemoglobin 14.2, MCV 93.7, platelets 309,000, WBC 6600, ANC 4600.  AFP was 1.2. Beta-HCG was <1. LDH was 147.   Labs on 08/07/2019 revealed a hematocrit of 43.9, hemoglobin 14.1, MCV 92.4, platelets 360,000, WBC 5400, ANC 3500.  AFP was 1.4.  LDH was 142.  Beta HCG is pending.  Chest, abdomen, and pelvis CT on 08/07/2019 revealed no evidence of metastatic disease in the chest, abdomen or pelvis.  The previously described left pelvic sidewall fluid density focus had resolved.   Last weight on 05/08/2019 was 259 lb.   During the interim, ***   Past Medical History:  Diagnosis Date  . Asthma    AS A CHILD-NO INHALERS  . Cancer ([place])    testicular cancer 11/2016 per pt   . DVT (deep venous thrombosis) ([place])   . GERD (gastroesophageal reflux disease)    TUMS PRN    Past Surgical History:  Procedure Laterality Date  . ANKLE FRACTURE SURGERY Right 2004  . FINE NEEDLE ASPIRATION BIOPSY N/A 11/09/2017   Procedure: FINE NEEDLE ASPIRATION BIOPSY;  Surgeon: [name], MD;  Location: [facility];  Service: Cardiovascular;  Laterality: N/A;  . IVC FILTER INSERTION    . IVC FILTER INSERTION N/A 04/12/2018   Procedure: IVC FILTER INSERTION;  Surgeon: [name], MD;  Location: [facility];  Service: Cardiovascular;  Laterality: N/A;  . IVC FILTER REMOVAL N/A 03/23/2018   Procedure: IVC FILTER REMOVAL;  Surgeon: [name], MD;  Location: [facility];  Service: Cardiovascular;  Laterality: N/A;  . ORCHIECTOMY Left 11/26/2015   Procedure: ORCHIECTOMY;  Surgeon: [name], MD;  Location: [facility];  Service: Urology;  Laterality: Left;  radical/Inguinal approach  . PERIPHERAL VASCULAR CATHETERIZATION N/A 12/16/2015   Procedure: [name] Cath Insertion;  Surgeon: [name], MD;  Location: [facility];  Service: Cardiovascular;  Laterality: N/A;  . PERIPHERAL VASCULAR THROMBECTOMY Left 10/13/2017   Procedure: PERIPHERAL VASCULAR THROMBECTOMY;  Surgeon: [name], MD;  Location: [facility];  Service: Cardiovascular;  Laterality: Left;  . WISDOM TOOTH EXTRACTION  2017    Family History  Problem Relation Age of Onset  . Skin cancer Mother   . Breast cancer Maternal Grandmother   . Colon cancer Maternal Grandfather   . Diabetes Maternal Grandfather   . Emphysema Father   . Mental illness Sister        anxiety  . Stroke Paternal Grandmother   . Prostate cancer Neg Hx   . Kidney cancer Neg Hx   . Bladder Cancer Neg Hx     Social History:  reports that he has been smoking cigarettes. He has a 4.00 pack-year smoking history. He has quit using smokeless tobacco.  His smokeless tobacco use included snuff. He reports previous drug use. Drug: Marijuana. He reports that he does not drink alcohol. He smokes 1/2 pack a day. He smokes marijuana. 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 His daughter is home schooled. The patient's mother's cell phone number is [phone]. Another contact number is [phone]. The patient lives in [place]. He started working at [employer] 40 hours a week.  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 The patient is {Blank single:19197::"alone","accompanied by"} *** today.   Allergies: No Known Allergies  Current Medications: Current Outpatient Medications  Medication Sig Dispense Refill  . acetaminophen (TYLENOL) 325 MG tablet Take 650  mg by mouth every 6 (six) hours as needed.    . gabapentin (NEURONTIN) 300 MG capsule TAKE 1 CAPSULE BY MOUTH EVERYDAY AT BEDTIME 30 capsule 2  . rivaroxaban (XARELTO) 20 MG TABS tablet Take 1 tablet (20 mg total) by mouth daily. 30 tablet 2   No current facility-administered medications for this visit.   Facility-Administered Medications Ordered in Other Visits  Medication Dose Route Frequency Provider Last Rate Last Admin  . filgrastim-sndz (ZARXIO) injection 480 mcg  480 mcg Subcutaneous Once [name], MD      . [facility]) injection 480 mcg  480 mcg Subcutaneous Once [name], MD        Review of Systems  Constitutional: Negative.  Negative for chills, diaphoresis, fever, malaise/fatigue and weight loss (up 2 lbs).       Feels "pretty good".  HENT: Negative.  Negative for congestion, ear pain, hearing loss, nosebleeds, sinus pain and sore throat.   Eyes: Negative.  Negative for blurred vision, double vision, pain and discharge.  Respiratory: Negative.  Negative for cough, hemoptysis, sputum production and shortness of breath.   Cardiovascular: Positive for leg swelling (intermittent left leg, feet and ankles). Negative for chest pain, palpitations, orthopnea and PND.  Gastrointestinal: Negative.  Negative for abdominal pain, blood in stool, constipation, diarrhea, heartburn, melena, nausea and vomiting.  Genitourinary: Negative.  Negative for dysuria, frequency, hematuria and urgency.  Musculoskeletal: Negative.  Negative for back pain, falls, joint pain, myalgias and neck pain.  Skin: Negative.  Negative for itching and rash.  Neurological: Positive for sensory change (neuropathy in fingertips is better; neuropathy in feet is unchanged). Negative for dizziness, tremors, speech change, focal weakness, weakness and headaches.  Endo/Heme/Allergies: Bruises/bleeds easily (on Xarelto).  Psychiatric/Behavioral: Negative for depression, memory loss and  suicidal ideas. The patient is not nervous/anxious and does not have insomnia.   All other systems reviewed and are negative.  Performance status (ECOG): 0 - Asymptomatic  Vitals There were no vitals taken for this visit.   Physical Exam  Constitutional: He is oriented to person, place, and time. He appears well-developed and well-nourished. No distress.  HENT:  Head: Normocephalic and atraumatic.  Mouth/Throat: Oropharynx is clear and moist. No oropharyngeal exudate.  Black cap.  Mask.  Brown hair and beard.  Eyes: Pupils are equal, round, and reactive to light. Conjunctivae and EOM are normal. No scleral icterus.  Neck: No JVD present.  Cardiovascular: Normal rate, regular rhythm and normal heart sounds.  No murmur heard. Pulmonary/Chest: Effort normal and breath sounds normal. No respiratory distress. He has no wheezes. He has no rales.  Abdominal: Soft. Bowel sounds are normal. He exhibits no distension and no mass. There is no abdominal tenderness. There is no rebound and no guarding.  Genitourinary:    Penis normal.     Genitourinary Comments: No testicular masses.   Musculoskeletal:        General: No tenderness or edema. Normal range of motion.     Cervical  back: Normal range of motion and neck supple.  Lymphadenopathy:       Head (right side): No preauricular, no posterior auricular and no occipital adenopathy present.       Head (left side): No preauricular, no posterior auricular and no occipital adenopathy present.    He has no cervical adenopathy.    He has no axillary adenopathy.       Right: No supraclavicular adenopathy present.       Left: No supraclavicular adenopathy present.  Neurological: He is alert and oriented to person, place, and time.  Skin: Skin is warm and dry. No rash noted. He is not diaphoretic. No erythema. No pallor.  Psychiatric: He has a normal mood and affect. His behavior is normal. Judgment and thought content normal.  Nursing note and vitals  reviewed.   Infusion on 08/07/2019  Component Date Value Ref Range Status  . WBC 08/07/2019 5.4  4.0 - 10.5 K/uL Final  . RBC 08/07/2019 4.75  4.22 - 5.81 MIL/uL Final  . Hemoglobin 08/07/2019 14.1  13.0 - 17.0 g/dL Final  . HCT 08/07/2019 43.9  39.0 - 52.0 % Final  . MCV 08/07/2019 92.4  80.0 - 100.0 fL Final  . MCH 08/07/2019 29.7  26.0 - 34.0 pg Final  . MCHC 08/07/2019 32.1  30.0 - 36.0 g/dL Final  . RDW 08/07/2019 13.0  11.5 - 15.5 % Final  . Platelets 08/07/2019 360  150 - 400 K/uL Final  . nRBC 08/07/2019 0.0  0.0 - 0.2 % Final  . Neutrophils Relative % 08/07/2019 65  % Final  . Neutro Abs 08/07/2019 3.5  1.7 - 7.7 K/uL Final  . Lymphocytes Relative 08/07/2019 21  % Final  . Lymphs Abs 08/07/2019 1.1  0.7 - 4.0 K/uL Final  . Monocytes Relative 08/07/2019 6  % Final  . Monocytes Absolute 08/07/2019 0.3  0.1 - 1.0 K/uL Final  . Eosinophils Relative 08/07/2019 6  % Final  . Eosinophils Absolute 08/07/2019 0.3  0.0 - 0.5 K/uL Final  . Basophils Relative 08/07/2019 1  % Final  . Basophils Absolute 08/07/2019 0.1  0.0 - 0.1 K/uL Final  . Immature Granulocytes 08/07/2019 1  % Final  . Abs Immature Granulocytes 08/07/2019 0.04  0.00 - 0.07 K/uL Final   Performed at [facility], [address], [place], [place] [address]  . Sodium 08/07/2019 138  135 - 145 mmol/L Final  . Potassium 08/07/2019 3.9  3.5 - 5.1 mmol/L Final  . Chloride 08/07/2019 105  98 - 111 mmol/L Final  . CO2 08/07/2019 25  22 - 32 mmol/L Final  . Glucose, Bld 08/07/2019 138* 70 - 99 mg/dL Final   Glucose reference range applies only to samples taken after fasting for at least 8 hours.  . BUN 08/07/2019 10  6 - 20 mg/dL Final  . Creatinine, Ser 08/07/2019 1.08  0.61 - 1.24 mg/dL Final  . Calcium 08/07/2019 8.8* 8.9 - 10.3 mg/dL Final  . Total Protein 08/07/2019 7.3  6.5 - 8.1 g/dL Final  . Albumin 08/07/2019 4.2  3.5 - 5.0 g/dL Final  . AST 08/07/2019 23  15 - 41 U/L Final  . ALT 08/07/2019 27  0 -  44 U/L Final  . Alkaline Phosphatase 08/07/2019 57  38 - 126 U/L Final  . Total Bilirubin 08/07/2019 0.5  0.3 - 1.2 mg/dL Final  . GFR calc non Af Amer 08/07/2019 >60  >60 mL/min Final  . GFR calc Af Wyvonnia Lora  08/07/2019 >60  >60 mL/min Final  . Anion gap 08/07/2019 8  5 - 15 Final   Performed at [facility], [address] [place], [place] [address]  . LDH 08/07/2019 142  98 - 192 U/L Final   Performed at [facility], [address] [place], [place] [address]  . AFP, Serum, Tumor Marker 08/07/2019 1.4  0.0 - 8.3 ng/mL Final   Comment: (NOTE) Roche [employer]ostics Electrochemiluminescence Immunoassay (ECLIA) Values obtained with different assay methods or kits cannot be used interchangeably.  Results cannot be interpreted as absolute evidence of the presence or absence of malignant disease. This test is not interpretable in pregnant females. Performed At: [facility] [place], [place] [id number] Rush Farmer MD [id number]     Assessment:  IZEK TALSMA is a 29 y.o. male with recurrent testicular cancer. He presented with a left lower extremity DVT on 09/10/2017.  He initially presented withstage IIB left testicular cancers/p left radical orchiectomy on 11/26/2015. 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 Clinical/pathologic stage is T1 N1-2 S0 M0.  He received 3 cycles of BEP(12/30/2015 - 02/24/2016). He received OnPro Neulasta with cycle #2 and #3. He declined evaluation for retroperitoneal lymph node dissection (RPLND). He declined sperm banking.  Pre surgery labson 11/20/2015 revealed an AFP of 411.9, beta-HCG 2,669, and LDH 522 (121-224).   Tumor markers have been followed: AFP was 411.9 (0-8.3) on 11/20/2015, 11.2 on 12/19/2015, 6.3 on 12/23/2015, 1.3 on 01/27/2016, 1.8 on 02/17/2016, 1.9 on  03/30/2016, 1.4 on 05/27/2016, 1.0 on 07/29/2016, 1.4 on 10/14/2016, 3.3 on 12/24/2016, 1 on 09/10/2017, 1 on 09/20/2017, 2.6 on 10/21/2017, 2.1 on 11/16/2017, 2.1 on 12/20/2017, 2.1 on 02/04/2018, 1.3 on 10/05/2018, 1.2 on 01/30/2019, 1.2 on 05/08/2019, and 1.4 on 08/07/2019.  Beta-HCGwas 2,669 (0-3) on 11/20/2015, 2 on 12/19/2015, 1 on 12/23/2015, <1 on 01/27/2016, <1 on 02/17/2016, <1 on 03/30/2016, <1 on 05/27/2016, <1 on 07/29/2016, <1 on 10/14/2016, and 3 on 12/24/2016, <5 on 09/11/2017, <5 on 09/20/2017, <1 on 10/21/2017, <1 on 11/16/2017, <1 on 12/20/2017, <1 on 02/04/2018, < 1 on 10/05/2018, < 1 on 01/30/2019, < 1 on 05/08/2019, and xx on 08/07/2019.  LDHwas 522 (98-192) on 11/20/2015, 179 on 12/19/2015, 160 on 12/23/2015, 202 on 01/27/2016, 156 on 02/10/2016, 161 on 02/17/2016, 172 on 03/30/2016, 178 on 05/27/2016, 150 on 07/29/2016, 152 on 10/14/2016, 145 on 12/24/2016, 1557 on 09/11/2017, 916 on 09/20/2017, 144 on 10/21/2017, 158 on 11/16/2017, 203 on 12/20/2017, 151 on 12/27/2017, 133 on 02/04/2018,130 on 10/05/2018, 129 on 01/30/2019, 147 on 05/08/2019, and 142 on 08/07/2019.  Chest, abdomen, and pelvic CTon 09/11/2017 revealed enlarged and morphologically abnormal 4.3 x 4.9 cm left pelvic sidewall and retroperitoneal lymph nodes (measuring up to 2.9 cm). 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 There was anasarca/swelling of the left lower extremity. There was no evidence of metastatic disease in the chest.  Head MRIon 09/13/2017 revealed no evidence of metastatic disease.  He has a left lower extremity DVT. Bilateral lower extremity duplexon 09/10/2017 revealed evidence of obstruction in the left CFV, SFJ, and proximal femoral vein. He is on Xarelto.  He received 4 cycles of TIPchemotherapy(09/21/2017 - 12/27/2017). Cycle [facility]1 was [facility] at [facility]. Cycles #2 and 3 were administered at  [facility]. Both cycles 1 and 2 were complicated by lower extremity skin lesions on  recovery of counts. 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 Patient received daily Zarxioinjections from 01/03/2018 - 01/12/2018.  PET scanon 02/03/2018 revealed left common and external iliac adenopathywithonly low-grade activity, less than that of the blood pool. The dominant lymph node is an external iliac node measuring 1.8 x 2.4cm(previously 2.3x 3.2cm).There was no new or enlarging adenopathy.There was low-grade activity along the left inguinal ring region near the orchectomy site, thought to be postoperative and not due to local malignancy.  He underwentrobotic assisted laparoscopic retroperitoneal lymph node dissectionand robotic inguinofemoral lymph node dissection on 03/05/2020at [facility]. Pathologyrevealed a left common iliac node with a large necrotic tumor deposit consistent with treatment effect (no residual viable tumor). There were 3 interaortocaval lymph nodes,9 paracaval lymph nodes, and3 periaortic lymph nodes negative for malignancy.  Chest, abdomen, and pelvis CT with contrast on 02/07/2019 revealed no acute intrathoracic, abdominal, or pelvic pathology.  There was no evidence of metastatic disease in the chest.  There was a single enlarged left external iliac chain lymph node similar in  size to the prior CT with no other adenopathy. There was interval resolution of the postoperative changes of the left groin with linear scarring with no fluid collection.  Chest, abdomen, and pelvis CT on 08/07/2019 revealed no evidence of metastatic disease in the chest, abdomen or pelvis.  The previously described left pelvic sidewall fluid density focus had resolved.  Audiogramon 10/27/2017 revealed hearing within normal limits.  He underwent IVC  filterplacement, percutaneous angioplastyand stentplacementof the left common iliac vein and left external iliac vein on 10/13/2017.He underwent an IVC filter removalon 01/08/2020and replacementon 04/12/2018. He remains on Xarelto.  Hypercoagulable work-upon 10/12/2017 negative for the following:Factor V Leiden, prothrombin gene mutation,lupus anticoagulant, anticardiolipin antibodies, beta-2 glycoprotein, protein C activity/antigen, protein S activity/antigen. ATIIIwas54% (75-120) on heparin.  Symptomatically, ***  Plan: 1.   Review labs from 08/07/2019.  2.Recurrent testicular cancer Patient completed 4 cycles of TIP chemotherapy.  He underwent retroperitoneal lymph node dissection on03/07/2018. He had 1 necrotic lymph node with no viable tumor. Chest, abdomen, pelvic CT scan on 02/07/2019 revealed no evidence of recurrent disease.                         Significance of isolated left inguinal node unclear.  Chest, abdomen, and pelvis CT on 08/07/2019 was personally reviewed.   There is no evidence of recurrent disease.    The left pelvic sidewall fluid density has resolved. 3. LEFT lower extremity DVT Patient is as/preplacement of the IVC filter. He isfelt atrisk for re-thrombosis. Continue Xarelto long-term. 4.Chemotherapy-induced neuropathy Patient has a stable mild neuropathy in his feet. Continue to monitor.  5.   Chest, abdomen, and pelvis CT on 08/07/2019. 6.   RTC in 3 months for MD assessment, labs (CBC with diff, CMP, LDH, beta-HCG, AFP- can be done before CT), and review of CT scans. 7.   ***  I discussed the assessment and treatment plan with the patient.  The patient was provided an opportunity to ask questions and all were answered.  The patient agreed with the plan and demonstrated an understanding of the instructions.  The patient was advised to call back if the symptoms worsen or if the condition fails to improve  as anticipated.  I provided *** minutes (4:17 PM - 4:17 PM) of face-to-face time during this this encounter and > 50% was spent counseling as documented under  my assessment and plan.   [name], MD, PhD    08/08/2019, 1:50 PM  I, Heywood Footman, am acting as a Education administrator for [name], MD.  {Add Scribe Attestation Statement}

## 2019-08-09 ENCOUNTER — Inpatient Hospital Stay: Payer: Medicaid Other | Admitting: Hematology and Oncology

## 2019-08-10 LAB — BETA HCG QUANT (REF LAB): hCG Quant: <1 m[IU]/mL (ref 0–3)

## 2019-08-18 ENCOUNTER — Other Ambulatory Visit: Payer: Self-pay | Admitting: Hematology and Oncology

## 2019-08-18 DIAGNOSIS — C6212 Malignant neoplasm of descended left testis: Secondary | ICD-10-CM

## 2019-08-18 DIAGNOSIS — I82412 Acute embolism and thrombosis of left femoral vein: Secondary | ICD-10-CM

## 2019-08-18 MED ORDER — GABAPENTIN 300 MG PO CAPS: ORAL_CAPSULE | ORAL | 0 refills | Status: DC

## 2019-08-18 MED ORDER — RIVAROXABAN 20 MG PO TABS: 20.0000 mg | ORAL_TABLET | Freq: Every day | ORAL | 0 refills | Status: DC

## 2019-08-30 ENCOUNTER — Other Ambulatory Visit: Payer: Self-pay | Admitting: Hematology and Oncology

## 2019-08-30 DIAGNOSIS — I82412 Acute embolism and thrombosis of left femoral vein: Secondary | ICD-10-CM

## 2019-08-30 NOTE — Progress Notes (Signed)
This encounter was created in error - please disregard.

## 2019-09-01 ENCOUNTER — Inpatient Hospital Stay: Payer: Medicaid Other | Attending: Hematology and Oncology | Admitting: Hematology and Oncology

## 2019-09-18 NOTE — Progress Notes (Signed)
This encounter was created in error - please disregard.

## 2019-09-22 ENCOUNTER — Other Ambulatory Visit: Payer: Self-pay | Admitting: Hematology and Oncology

## 2019-09-22 DIAGNOSIS — I82412 Acute embolism and thrombosis of left femoral vein: Secondary | ICD-10-CM

## 2019-10-19 ENCOUNTER — Other Ambulatory Visit: Payer: Self-pay

## 2019-10-19 ENCOUNTER — Encounter: Payer: Self-pay | Admitting: Hematology and Oncology

## 2019-10-19 DIAGNOSIS — C6212 Malignant neoplasm of descended left testis: Secondary | ICD-10-CM

## 2019-10-19 NOTE — Progress Notes (Signed)
Patient called/ pre- screened for virtual appoinment tomorrow with oncologist. No concerns or complaints at this time.

## 2019-10-19 NOTE — Progress Notes (Signed)
[facility]  [address] [place], [place] [address] Phone: [phone]  Fax: [phone]   Telemedicine Office Visit:  10/20/2019  Referring physician: No ref. provider found  I connected with Edgar [name] on 10/20/2019 at 11:41 AM by videoconferencing and verified that I was speaking with the correct person using 2 identifiers.  The patient was at work in [employer].  I discussed the limitations, risk, security and privacy concerns of performing an evaluation and management service by videoconferencing and the availability of in person appointments.  I also discussed with the patient that there may be a patient responsible charge related to this service.  The patient expressed understanding and agreed to proceed.   Chief Complaint: Edgar [name] is a 29 y.o. male with recurrent testicular cancer who is seen for a 6 month assessment.  HPI: The patient was last seen in the medical oncology clinic on 05/08/2019. At that time, he was doing well.  Exam revealed no testicular masses or adenopathy. Hematocrit was 44.3, hemoglobin 14.2, platelets 309,000 WBC 6,600. Normal CMP. Beta HCG quant was <1. AFP was 1.2. LDH was 147.  Active surveillance continued. He remained on Xarelto.  Chest, abdomen, and pelvis CT with contrast on 08/07/2019 revealed no evidence of metastatic disease in the chest, abdomen or pelvis. The previously described left pelvic sidewall fluid density focus had resolved. There was emphysema.  Labs on 08/07/2019 revealed a hematocrit of 43.9, hemoglobin 14.1, platelets 360,000, WBC 5,400.  Beta HCG was <1. AFP was 1.4.  LDH was 142.  During the interim, he has done well.  He denies any chest pain, shortness of breath, cough, abdominal symptoms or pain.  He states that his strength is "finally back".  Weight is stable at approximately 250 pounds.  He notes some swelling in his left leg if he stands for a long time.  Neuropathy in his fingertips  has resolved.  His toes still bother him.  He is taking gabapentin 300 mg p.o. nightly.  He remains on Xarelto.  He denies any excess bruising or bleeding.   Past Medical History:  Diagnosis Date  . Asthma    AS A CHILD-NO INHALERS  . Cancer ([place])    testicular cancer 11/2016 per pt   . DVT (deep venous thrombosis) ([place])   . GERD (gastroesophageal reflux disease)    TUMS PRN    Past Surgical History:  Procedure Laterality Date  . ANKLE FRACTURE SURGERY Right 2004  . FINE NEEDLE ASPIRATION BIOPSY N/A 11/09/2017   Procedure: FINE NEEDLE ASPIRATION BIOPSY;  Surgeon: [name], MD;  Location: [place] [facility];  Service: Cardiovascular;  Laterality: N/A;  . IVC FILTER INSERTION    . IVC FILTER INSERTION N/A 04/12/2018   Procedure: IVC FILTER INSERTION;  Surgeon: [name], MD;  Location: [place] [facility];  Service: Cardiovascular;  Laterality: N/A;  . IVC FILTER REMOVAL N/A 03/23/2018   Procedure: IVC FILTER REMOVAL;  Surgeon: [name], MD;  Location: [place] [facility];  Service: Cardiovascular;  Laterality: N/A;  . ORCHIECTOMY Left 11/26/2015   Procedure: ORCHIECTOMY;  Surgeon: [name], MD;  Location: [facility];  Service: Urology;  Laterality: Left;  radical/Inguinal approach  . PERIPHERAL VASCULAR CATHETERIZATION N/A 12/16/2015   Procedure: [name] Cath Insertion;  Surgeon: [name], MD;  Location: [place] [facility];  Service: Cardiovascular;  Laterality: N/A;  . PERIPHERAL VASCULAR THROMBECTOMY Left 10/13/2017   Procedure: PERIPHERAL VASCULAR THROMBECTOMY;  Surgeon: [name], MD;  Location: [place] [facility];  Service: Cardiovascular;  Laterality: Left;  . WISDOM TOOTH EXTRACTION  2017    Family History  Problem Relation Age of Onset  . Skin cancer Mother   . Breast cancer Maternal Grandmother   . Colon cancer Maternal Grandfather   . Diabetes Maternal Grandfather   . Emphysema Father   . Mental illness Sister        anxiety   . Stroke Paternal Grandmother   . Prostate cancer Neg Hx   . Kidney cancer Neg Hx   . Bladder Cancer Neg Hx     Social History:  reports that he has been smoking cigarettes. He has a 4.00 pack-year smoking history. He has quit using smokeless tobacco.  His smokeless tobacco use included snuff. He reports previous drug use. Drug: Marijuana. He reports that he does not drink alcohol. He smokes 1/2 pack a day. He smokes marijuana. 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 His daughter is home schooled. The patient's mother's cell phone number is [phone]. Another contact number is [phone]. The patient lives in [place]. He started working at [employer] 40 hours a week.  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  The patient is alone today.  Participants in the patient's visit and their role in the encounter included the patient and Tawni Carnes, RN today.  The intake visit was provided by Tawni Carnes, RN.   Allergies: No Known Allergies  Current Medications: Current Outpatient Medications  Medication Sig Dispense Refill  . acetaminophen (TYLENOL) 325 MG tablet Take 650 mg by mouth every 6 (six) hours as needed.    . gabapentin (NEURONTIN) 300 MG capsule TAKE 1 CAPSULE BY MOUTH EVERYDAY AT BEDTIME 30 capsule 0  . XARELTO 20 MG TABS tablet TAKE 1 TABLET BY MOUTH EVERY DAY 30 tablet 0   No current facility-administered medications for this visit.   Facility-Administered Medications Ordered in Other Visits  Medication Dose Route Frequency Provider Last Rate Last Admin  . filgrastim-sndz (ZARXIO) injection 480 mcg  480 mcg Subcutaneous Once [name], MD      . Karle Barr [facility]) injection 480 mcg  480 mcg Subcutaneous Once [name], MD        Review of Systems  Constitutional: Negative.  Negative for chills,  diaphoresis, fever, malaise/fatigue and weight loss (stable around 250 pounds).       He feels "pretty good".  He notes that his strength is finally back.  HENT: Negative.  Negative for congestion, ear pain, hearing loss, nosebleeds, sinus pain and sore throat.   Eyes: Negative.  Negative for blurred vision, double vision, pain, discharge and redness.  Respiratory: Negative.  Negative for cough, hemoptysis, sputum production and shortness of breath.   Cardiovascular: Positive for leg swelling (left leg if standing for long periods). Negative for chest pain, palpitations, orthopnea and PND.  Gastrointestinal: Negative.  Negative for abdominal pain, blood in stool, constipation, diarrhea, heartburn, melena, nausea and vomiting.  Genitourinary: Negative.  Negative for dysuria, frequency, hematuria and urgency.  Musculoskeletal: Negative.  Negative for back pain, falls, joint pain, myalgias and neck pain.  Skin: Negative.  Negative for itching and rash.  Neurological: Positive for sensory change (neuropathy in feet- stable). Negative for dizziness, tingling, speech change, focal weakness, weakness and headaches.  Endo/Heme/Allergies: Does not bruise/bleed easily (on Xarelto).  Psychiatric/Behavioral: Negative.  Negative for depression, memory loss and suicidal ideas. The patient is not nervous/anxious and does not have insomnia.   All other systems reviewed and are negative.  Performance status (ECOG):  0  Vitals There were no vitals taken for this visit.   Physical Exam Nursing note reviewed.  Constitutional:      General: He is not in acute distress.    Appearance: Normal appearance. He is well-developed. He is not diaphoretic.  HENT:     Head: Normocephalic and atraumatic.     Comments: Cap.  Dark hair and beard. Eyes:     Comments: Wearing sunglasses.  Skin:    Coloration: Skin is not pale.  Neurological:     Mental Status: He is alert and oriented to person, place, and time. Mental  status is at baseline.  Psychiatric:        Behavior: Behavior normal.        Thought Content: Thought content normal.        Judgment: Judgment normal.    No visits with results within 3 Day(s) from this visit.  Latest known visit with results is:  Infusion on 08/07/2019  Component Date Value Ref Range Status  . WBC 08/07/2019 5.4  4.0 - 10.5 K/uL Final  . RBC 08/07/2019 4.75  4.22 - 5.81 MIL/uL Final  . Hemoglobin 08/07/2019 14.1  13.0 - 17.0 g/dL Final  . HCT 08/07/2019 43.9  39 - 52 % Final  . MCV 08/07/2019 92.4  80.0 - 100.0 fL Final  . MCH 08/07/2019 29.7  26.0 - 34.0 pg Final  . MCHC 08/07/2019 32.1  30.0 - 36.0 g/dL Final  . RDW 08/07/2019 13.0  11.5 - 15.5 % Final  . Platelets 08/07/2019 360  150 - 400 K/uL Final  . nRBC 08/07/2019 0.0  0.0 - 0.2 % Final  . Neutrophils Relative % 08/07/2019 65  % Final  . Neutro Abs 08/07/2019 3.5  1.7 - 7.7 K/uL Final  . Lymphocytes Relative 08/07/2019 21  % Final  . Lymphs Abs 08/07/2019 1.1  0.7 - 4.0 K/uL Final  . Monocytes Relative 08/07/2019 6  % Final  . Monocytes Absolute 08/07/2019 0.3  0 - 1 K/uL Final  . Eosinophils Relative 08/07/2019 6  % Final  . Eosinophils Absolute 08/07/2019 0.3  0 - 0 K/uL Final  . Basophils Relative 08/07/2019 1  % Final  . Basophils Absolute 08/07/2019 0.1  0 - 0 K/uL Final  . Immature Granulocytes 08/07/2019 1  % Final  . Abs Immature Granulocytes 08/07/2019 0.04  0.00 - 0.07 K/uL Final   Performed at [facility], [address] [place], [place] [address]  . Sodium 08/07/2019 138  135 - 145 mmol/L Final  . Potassium 08/07/2019 3.9  3.5 - 5.1 mmol/L Final  . Chloride 08/07/2019 105  98 - 111 mmol/L Final  . CO2 08/07/2019 25  22 - 32 mmol/L Final  . Glucose, Bld 08/07/2019 138* 70 - 99 mg/dL Final   Glucose reference range applies only to samples taken after fasting for at least 8 hours.  . BUN 08/07/2019 10  6 - 20 mg/dL Final  . Creatinine, Ser 08/07/2019 1.08  0.61 - 1.24 mg/dL  Final  . Calcium 08/07/2019 8.8* 8.9 - 10.3 mg/dL Final  . Total Protein 08/07/2019 7.3  6.5 - 8.1 g/dL Final  . Albumin 08/07/2019 4.2  3.5 - 5.0 g/dL Final  .  AST 08/07/2019 23  15 - 41 U/L Final  . ALT 08/07/2019 27  0 - 44 U/L Final  . Alkaline Phosphatase 08/07/2019 57  38 - 126 U/L Final  . Total Bilirubin 08/07/2019 0.5  0.3 - 1.2 mg/dL Final  . GFR calc non Af Amer 08/07/2019 >60  >60 mL/min Final  . GFR calc Af Amer 08/07/2019 >60  >60 mL/min Final  . Anion gap 08/07/2019 8  5 - 15 Final   Performed at [facility], [address], [place], [place] [address]  . LDH 08/07/2019 142  98 - 192 U/L Final   Performed at [facility], [address] [place], [place] [address]  . AFP, Serum, Tumor Marker 08/07/2019 1.4  0.0 - 8.3 ng/mL Final   Comment: (NOTE) Roche [employer]ostics Electrochemiluminescence Immunoassay (ECLIA) Values obtained with different assay methods or kits cannot be used interchangeably.  Results cannot be interpreted as absolute evidence of the presence or absence of malignant disease. This test is not interpretable in pregnant females. Performed At: [facility] [place], [place] [id number] Rush Farmer MD WG:[id number]   . hCG Quant 08/07/2019 <1  0 - 3 mIU/mL Final   Comment: (NOTE) Roche ECLIA methodology Performed At: [facility] [place], [place] [id number] Rush Farmer MD MW:[id number]     Assessment:  Edgar [name] is a 29 y.o. male with recurrent testicular cancer. He presented with a left lower extremity DVT on 09/10/2017.  He initially presented withstage IIB left testicular cancers/p left radical orchiectomy on 11/26/2015. 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 Clinical/pathologic stage is T1 N1-2 S0 M0.  He received 3 cycles of BEP(12/30/2015  - 02/24/2016). He received OnPro Neulasta with cycle #2 and #3. He declined evaluation for retroperitoneal lymph node dissection (RPLND). He declined sperm banking.  Pre surgery labson 11/20/2015 revealed an AFP of 411.9, beta-HCG 2,669, and LDH 522 (121-224).   Tumor markers have been followed: AFP was 411.9 (0-8.3) on 11/20/2015, 11.2 on 12/19/2015, 6.3 on 12/23/2015, 1.3 on 01/27/2016, 1.8 on 02/17/2016, 1.9 on 03/30/2016, 1.4 on 05/27/2016, 1.0 on 07/29/2016, 1.4 on 10/14/2016, 3.3 on 12/24/2016, 1 on 09/10/2017, 1 on 09/20/2017, 2.6 on 10/21/2017, 2.1 on 11/16/2017, 2.1 on 12/20/2017, 2.1 on 02/04/2018, 1.3 on 10/05/2018, 1.2 on 01/30/2019, 1.2 on 05/08/2019, and 1.4 on 08/07/2019.  Beta-HCGwas 2,669 (0-3) on 11/20/2015, 2 on 12/19/2015, 1 on 12/23/2015, <1 on 01/27/2016, <1 on 02/17/2016, <1 on 03/30/2016, <1 on 05/27/2016, <1 on 07/29/2016, <1 on 10/14/2016, and 3 on 12/24/2016, <5 on 09/11/2017, <5 on 09/20/2017, <1 on 10/21/2017, <1 on 11/16/2017, <1 on 12/20/2017, <1 on 02/04/2018, < 1 on 10/05/2018, < 1 on 01/30/2019, < 1 on 05/08/2019, and < 1 on 08/07/2019.  LDHwas 522 (98-192) on 11/20/2015, 179 on 12/19/2015, 160 on 12/23/2015, 202 on 01/27/2016, 156 on 02/10/2016, 161 on 02/17/2016, 172 on 03/30/2016, 178 on 05/27/2016, 150 on 07/29/2016, 152 on 10/14/2016, 145 on 12/24/2016, 1557 on 09/11/2017, 916 on 09/20/2017, 144 on 10/21/2017, 158 on 11/16/2017, 203 on 12/20/2017, 151 on 12/27/2017, 133 on 02/04/2018,130 on 10/05/2018, 129 on 01/30/2019, 147 on 05/08/2019, and 142 on 08/07/2019.  Chest, abdomen, and pelvic CTon 09/11/2017 revealed enlarged and morphologically abnormal 4.3 x 4.9 cm left pelvic sidewall and retroperitoneal lymph nodes (measuring up to 2.9 cm). There was persistent left external iliac vein thrombus, better evaluated on prior  CT venogram dated 09/10/2017. There was anasarca/swelling of the left lower extremity. There was no evidence of  metastatic disease in the chest.  Head MRIon 09/13/2017 revealed no evidence of metastatic disease.  He has a left lower extremity DVT. Bilateral lower extremity duplexon 09/10/2017 revealed evidence of obstruction in the left CFV, SFJ, and proximal femoral vein. He is on Xarelto.  He received 4 cycles of TIPchemotherapy(09/21/2017 - 12/27/2017). Cycle #1 was administered at [facility]. Cycles #2 and 3 were administered at [facility]. Both cycles 1 and 2 were complicated by lower extremity skin lesions on recovery of counts. 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 Patient received daily Zarxioinjections from 01/03/2018 - 01/12/2018.  PET scanon 02/03/2018 revealed left common and external iliac adenopathywithonly low-grade activity, less than that of the blood pool. The dominant lymph node is an external iliac node measuring 1.8 x 2.4cm(previously 2.3x 3.2cm).There was no new or enlarging adenopathy.There was low-grade activity along the left inguinal ring region near the orchectomy site, thought to be postoperative and not due to local malignancy.  He underwentrobotic assisted laparoscopic retroperitoneal lymph node dissectionand robotic inguinofemoral lymph node dissection on 03/05/2020at [facility]. Pathologyrevealed a left common iliac node with a large necrotic tumor deposit consistent with treatment effect (no residual viable tumor). There were 3 interaortocaval lymph nodes,9 paracaval lymph nodes, and3 periaortic lymph nodes negative for malignancy.  Chest, abdomen, and pelvis CT with contrast on 02/07/2019 revealed no acute intrathoracic, abdominal, or pelvic pathology.  There was no evidence of metastatic disease in the chest.  There was a single enlarged left external iliac chain lymph node similar in  size to the prior CT with no other  adenopathy. There was interval resolution of the postoperative changes of the left groin with linear scarring with no fluid collection.  Chest, abdomen, and pelvis CT with on 08/07/2019 revealed no evidence of metastatic disease in the chest, abdomen or pelvis. The previously described left pelvic sidewall fluid density focus had resolved.  Audiogramon 10/27/2017 revealed hearing within normal limits.  He underwent IVC filterplacement, percutaneous angioplastyand stentplacementof the left common iliac vein and left external iliac vein on 10/13/2017.He underwent an IVC filter removalon 01/08/2020and replacementon 04/12/2018. He remains on Xarelto.  Hypercoagulable work-upon 10/12/2017 negative for the following:Factor V Leiden, prothrombin gene mutation,lupus anticoagulant, anticardiolipin antibodies, beta-2 glycoprotein, protein C activity/antigen, protein S activity/antigen. ATIIIwas54% (75-120) on heparin.  Symptomatically, he is doing well.  He has a residual neuropathy in his toes.  Plan: 1.   Review labs from 08/07/2019. 2.Recurrent testicular cancer Patient completed 4 cycles of TIP chemotherapy.  He underwent retroperitoneal lymph node dissection on03/07/2018. He had 1 necrotic lymph node with no viable tumor. Chest, abdomen, pelvic CT scan on 08/07/2019 was personally reviewed.  Agree with radiology interpretation.    No evidence of recurrent disease.             AFP, beta-HCG, and LDH were normal.  Discuss ongoing follow-up:   Year 2: H&P and markers every 3 months and scans every 6 months.  Discuss need for physical exam (patient notes difficulty with new job and work schedule).  Review plan for next CT in 01/2020. 3. LEFT lower extremity DVT Patient is as/preplacement of the IVC filter. He is at risk for thrombosis. Continue Xarelto (refilled). 4.Chemotherapy-induced neuropathy Patient has a stable neuropathy in his  feet. Continue gabapentin 300 mg p.o. nightly (refilled). 5.   Chest, abdomen and pelvis  CT scan on 02/07/2020. 6.   RTC on 11/07/2019 for MD assessment, labs (CBC with diff, CMP, LDH, beta hCG, AFP). 7.   RTC after CT scans for MD assessment, labs (CBC with diff, CMP, LDH, beta hCG, AFP) and review of imaging.  I discussed the assessment and treatment plan with the patient.  The patient was provided an opportunity to ask questions and all were answered.  The patient agreed with the plan and demonstrated an understanding of the instructions.  The patient was advised to call back if the symptoms worsen or if the condition fails to improve as anticipated.   [name], MD, PhD    10/20/2019, 12:14 PM

## 2019-10-20 ENCOUNTER — Inpatient Hospital Stay: Payer: Medicaid Other | Attending: Hematology and Oncology | Admitting: Hematology and Oncology

## 2019-10-20 DIAGNOSIS — I82412 Acute embolism and thrombosis of left femoral vein: Secondary | ICD-10-CM

## 2019-10-20 DIAGNOSIS — F17210 Nicotine dependence, cigarettes, uncomplicated: Secondary | ICD-10-CM | POA: Insufficient documentation

## 2019-10-20 DIAGNOSIS — Z7901 Long term (current) use of anticoagulants: Secondary | ICD-10-CM | POA: Insufficient documentation

## 2019-10-20 DIAGNOSIS — Z808 Family history of malignant neoplasm of other organs or systems: Secondary | ICD-10-CM | POA: Insufficient documentation

## 2019-10-20 DIAGNOSIS — K219 Gastro-esophageal reflux disease without esophagitis: Secondary | ICD-10-CM | POA: Insufficient documentation

## 2019-10-20 DIAGNOSIS — M7989 Other specified soft tissue disorders: Secondary | ICD-10-CM | POA: Insufficient documentation

## 2019-10-20 DIAGNOSIS — Z86718 Personal history of other venous thrombosis and embolism: Secondary | ICD-10-CM | POA: Diagnosis not present

## 2019-10-20 DIAGNOSIS — G620 Drug-induced polyneuropathy: Secondary | ICD-10-CM | POA: Diagnosis not present

## 2019-10-20 DIAGNOSIS — Z800 Family history of malignant neoplasm of digestive organs: Secondary | ICD-10-CM | POA: Insufficient documentation

## 2019-10-20 DIAGNOSIS — C6212 Malignant neoplasm of descended left testis: Secondary | ICD-10-CM | POA: Insufficient documentation

## 2019-10-20 DIAGNOSIS — T451X5A Adverse effect of antineoplastic and immunosuppressive drugs, initial encounter: Secondary | ICD-10-CM

## 2019-10-20 DIAGNOSIS — Z79899 Other long term (current) drug therapy: Secondary | ICD-10-CM | POA: Insufficient documentation

## 2019-10-20 DIAGNOSIS — Z803 Family history of malignant neoplasm of breast: Secondary | ICD-10-CM | POA: Insufficient documentation

## 2019-10-20 MED ORDER — RIVAROXABAN 20 MG PO TABS
20.0000 mg | ORAL_TABLET | Freq: Every day | ORAL | 2 refills | Status: DC
Start: 2019-10-20 — End: 2020-06-13

## 2019-10-20 MED ORDER — GABAPENTIN 300 MG PO CAPS: ORAL_CAPSULE | ORAL | 2 refills | Status: DC

## 2019-11-02 ENCOUNTER — Other Ambulatory Visit: Payer: Self-pay

## 2019-11-02 DIAGNOSIS — C6212 Malignant neoplasm of descended left testis: Secondary | ICD-10-CM

## 2019-11-06 ENCOUNTER — Other Ambulatory Visit: Payer: Self-pay

## 2019-11-06 DIAGNOSIS — C6212 Malignant neoplasm of descended left testis: Secondary | ICD-10-CM

## 2019-11-06 NOTE — Progress Notes (Signed)
[facility]  [address] [place], [place] [address] Phone: [phone]  Fax: [phone]   Clinic Day:  11/07/2019  Referring physician: No ref. provider found  Chief Complaint: Edgar [name] is a 29 y.o. male with recurrent testicular cancer who is seen for assessment.  HPI: The patient was last seen in the medical oncology clinic on 10/20/2019 via telemedicine. At that time, he was doing well.  He had a residual neuropathy in his toes. He continued Xarelto and Gabapentin.  He had missed several appointments.  We discussed the importance of ongoing surveillance with physical exams (last 05/08/2019), imaging, and tumor markers.  Chest, abdomen and pelvis CT with contrast is scheduled for 02/06/2020.  During the interim, he has been fine. He denies any new symptoms or complaints at this time. Denies bruising and bleeding on Xarelto. His neuropathy and leg swelling are stable. He states that his strength is back to normal. He did not get a lot of sleep last night because his daughter has swimmer's ear.   Past Medical History:  Diagnosis Date  . Asthma    AS A CHILD-NO INHALERS  . Cancer ([place])    testicular cancer 11/2016 per pt   . DVT (deep venous thrombosis) ([place])   . GERD (gastroesophageal reflux disease)    TUMS PRN    Past Surgical History:  Procedure Laterality Date  . ANKLE FRACTURE SURGERY Right 2004  . FINE NEEDLE ASPIRATION BIOPSY N/A 11/09/2017   Procedure: FINE NEEDLE ASPIRATION BIOPSY;  Surgeon: [name], MD;  Location: [place] [facility];  Service: Cardiovascular;  Laterality: N/A;  . IVC FILTER INSERTION    . IVC FILTER INSERTION N/A 04/12/2018   Procedure: IVC FILTER INSERTION;  Surgeon: [name], MD;  Location: [place] [facility];  Service: Cardiovascular;  Laterality: N/A;  . IVC FILTER REMOVAL N/A 03/23/2018   Procedure: IVC FILTER REMOVAL;  Surgeon: [name], MD;  Location: [place] [facility];  Service: Cardiovascular;  Laterality: N/A;  . ORCHIECTOMY Left 11/26/2015   Procedure: ORCHIECTOMY;  Surgeon: [name], MD;  Location: [facility];  Service: Urology;  Laterality: Left;  radical/Inguinal approach  . PERIPHERAL VASCULAR CATHETERIZATION N/A 12/16/2015   Procedure: [name] Cath Insertion;  Surgeon: [name], MD;  Location: [place] [facility];  Service: Cardiovascular;  Laterality: N/A;  . PERIPHERAL VASCULAR THROMBECTOMY Left 10/13/2017   Procedure: PERIPHERAL VASCULAR THROMBECTOMY;  Surgeon: [name], MD;  Location: [place] [facility];  Service: Cardiovascular;  Laterality: Left;  . WISDOM TOOTH EXTRACTION  2017    Family History  Problem Relation Age of Onset  . Skin cancer Mother   . Breast cancer Maternal Grandmother   . Colon cancer Maternal Grandfather   . Diabetes Maternal Grandfather   . Emphysema Father   . Mental illness Sister        anxiety  . Stroke Paternal Grandmother   . Prostate cancer Neg Hx   . Kidney cancer Neg Hx   . Bladder Cancer Neg Hx     Social History:  reports that he has been smoking cigarettes. He has a 4.00 pack-year smoking history. He has quit using smokeless tobacco.  His smokeless tobacco use included snuff. He reports previous drug use. Drug: Marijuana. He reports that he does not drink alcohol. He smokes 1/2 pack a day. He smokes marijuana. 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 His  daughter is home schooled. The patient's mother's cell phone number is [phone]. Another contact number is [phone]. The patient lives in [place]. He started working at [employer] 40 hours a week.  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  The patient is alone today.  Allergies: No Known Allergies  Current Medications: Current Outpatient Medications  Medication Sig Dispense  Refill  . acetaminophen (TYLENOL) 325 MG tablet Take 650 mg by mouth every 6 (six) hours as needed.    . gabapentin (NEURONTIN) 300 MG capsule TAKE 1 CAPSULE BY MOUTH EVERYDAY AT BEDTIME 30 capsule 2  . rivaroxaban (XARELTO) 20 MG TABS tablet Take 1 tablet (20 mg total) by mouth daily with supper. 30 tablet 2  . XARELTO 20 MG TABS tablet TAKE 1 TABLET BY MOUTH EVERY DAY (Patient not taking: Reported on 11/07/2019) 30 tablet 0   No current facility-administered medications for this visit.   Facility-Administered Medications Ordered in Other Visits  Medication Dose Route Frequency Provider Last Rate Last Admin  . filgrastim-sndz (ZARXIO) injection 480 mcg  480 mcg Subcutaneous Once [name], MD      . Karle Barr [facility]) injection 480 mcg  480 mcg Subcutaneous Once Khayden Herzberg C, MD      . sodium chloride flush (NS) 0.9 % injection 10 mL  10 mL Intravenous PRN [name], MD   10 mL at 11/07/19 1255   Review of Systems  Constitutional: Negative.  Negative for chills, diaphoresis, fever, malaise/fatigue and weight loss (up 22 lbs in past 6 months).  HENT: Negative.  Negative for congestion, ear discharge, ear pain, hearing loss, nosebleeds, sinus pain, sore throat and tinnitus.   Eyes: Negative.  Negative for blurred vision, double vision, pain, discharge and redness.  Respiratory: Negative.  Negative for cough, hemoptysis, sputum production and shortness of breath.   Cardiovascular: Positive for leg swelling (stable). Negative for chest pain, palpitations, orthopnea and PND.  Gastrointestinal: Negative.  Negative for abdominal pain, blood in stool, constipation, diarrhea, heartburn, melena, nausea and vomiting.       Eating well.  Genitourinary: Negative.  Negative for dysuria, frequency, hematuria and urgency.  Musculoskeletal: Negative.  Negative for back pain, falls, joint pain, myalgias and neck pain.  Skin: Negative.  Negative for itching and rash.   Neurological: Positive for sensory change (neuropathy in feet, stable). Negative for dizziness, tingling, speech change, focal weakness, weakness and headaches.  Endo/Heme/Allergies: Does not bruise/bleed easily.  Psychiatric/Behavioral: Negative.  Negative for depression, memory loss and suicidal ideas. The patient is not nervous/anxious and does not have insomnia.   All other systems reviewed and are negative.  Performance status (ECOG):  0  Vitals Blood pressure 126/79, pulse 99, temperature (!) 97.3 F (36.3 C), temperature source Tympanic, resp. rate 18, height 6\' 3"  (1.905 m), weight 281 lb 13.7 oz (127.9 kg), SpO2 100 %.   Physical Exam Vitals and nursing note reviewed.  Constitutional:      General: He is not in acute distress.    Appearance: Normal appearance. He is well-developed. He is not diaphoretic.  HENT:     Head: Normocephalic and atraumatic.     Comments: Brown hair.    Mouth/Throat:     Mouth: Mucous membranes are moist.     Pharynx: Oropharynx is clear.  Eyes:     General: No scleral icterus.    Extraocular Movements: Extraocular  movements intact.     Conjunctiva/sclera: Conjunctivae normal.     Pupils: Pupils are equal, round, and reactive to light.  Cardiovascular:     Rate and Rhythm: Normal rate and regular rhythm.     Heart sounds: Normal heart sounds. No murmur heard.   Pulmonary:     Effort: Pulmonary effort is normal. No respiratory distress.     Breath sounds: Normal breath sounds. No stridor. No wheezing or rales.  Chest:     Chest wall: No tenderness.  Abdominal:     General: Bowel sounds are normal. There is no distension.     Palpations: Abdomen is soft. There is no mass.     Tenderness: There is no abdominal tenderness. There is no guarding or rebound.  Genitourinary:    Testes:        Right: Mass or swelling not present.     Comments: Left testes absent. Musculoskeletal:        General: No swelling or tenderness. Normal range of  motion.     Cervical back: Normal range of motion and neck supple.  Lymphadenopathy:     Head:     Right side of head: No preauricular, posterior auricular or occipital adenopathy.     Left side of head: No preauricular, posterior auricular or occipital adenopathy.     Cervical: No cervical adenopathy.     Upper Body:     Right upper body: No supraclavicular or axillary adenopathy.     Left upper body: No supraclavicular or axillary adenopathy.     Lower Body: No right inguinal adenopathy. No left inguinal adenopathy.  Skin:    General: Skin is warm and dry.     Coloration: Skin is not pale.  Neurological:     Mental Status: He is alert and oriented to person, place, and time. Mental status is at baseline.  Psychiatric:        Behavior: Behavior normal.        Thought Content: Thought content normal.        Judgment: Judgment normal.    Infusion on 11/07/2019  Component Date Value Ref Range Status  . WBC 11/07/2019 6.1  4.0 - 10.5 K/uL Final  . RBC 11/07/2019 4.42  4.22 - 5.81 MIL/uL Final  . Hemoglobin 11/07/2019 13.4  13.0 - 17.0 g/dL Final  . HCT 11/07/2019 41.5  39 - 52 % Final  . MCV 11/07/2019 93.9  80.0 - 100.0 fL Final  . MCH 11/07/2019 30.3  26.0 - 34.0 pg Final  . MCHC 11/07/2019 32.3  30.0 - 36.0 g/dL Final  . RDW 11/07/2019 13.1  11.5 - 15.5 % Final  . Platelets 11/07/2019 301  150 - 400 K/uL Final  . nRBC 11/07/2019 0.0  0.0 - 0.2 % Final  . Neutrophils Relative % 11/07/2019 54  % Final  . Neutro Abs 11/07/2019 3.3  1.7 - 7.7 K/uL Final  . Lymphocytes Relative 11/07/2019 27  % Final  . Lymphs Abs 11/07/2019 1.6  0.7 - 4.0 K/uL Final  . Monocytes Relative 11/07/2019 11  % Final  . Monocytes Absolute 11/07/2019 0.7  0 - 1 K/uL Final  . Eosinophils Relative 11/07/2019 7  % Final  . Eosinophils Absolute 11/07/2019 0.4  0 - 0 K/uL Final  . Basophils Relative 11/07/2019 1  % Final  . Basophils Absolute 11/07/2019 0.1  0 - 0 K/uL Final  . Immature Granulocytes  11/07/2019 0  % Final  . Abs Immature Granulocytes 11/07/2019  0.02  0.00 - 0.07 K/uL Final   Performed at [facility], [address], [place], [place] [address]    Assessment:  Edgar [name] is a 29 y.o. male with recurrent testicular cancer. He presented with a left lower extremity DVT on 09/10/2017.  He initially presented withstage IIB left testicular cancers/p left radical orchiectomy on 11/26/2015. 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 Clinical/pathologic stage is T1 N1-2 S0 M0.  He received 3 cycles of BEP(12/30/2015 - 02/24/2016). He received OnPro Neulasta with cycle #2 and #3. He declined evaluation for retroperitoneal lymph node dissection (RPLND). He declined sperm banking.  Pre surgery labson 11/20/2015 revealed an AFP of 411.9, beta-HCG 2,669, and LDH 522 (121-224).   Tumor markers have been followed: AFP was 411.9 (0-8.3) on 11/20/2015, 11.2 on 12/19/2015, 6.3 on 12/23/2015, 1.3 on 01/27/2016, 1.8 on 02/17/2016, 1.9 on 03/30/2016, 1.4 on 05/27/2016, 1.0 on 07/29/2016, 1.4 on 10/14/2016, 3.3 on 12/24/2016, 1 on 09/10/2017, 1 on 09/20/2017, 2.6 on 10/21/2017, 2.1 on 11/16/2017, 2.1 on 12/20/2017, 2.1 on 02/04/2018, 1.3 on 10/05/2018, 1.2 on 01/30/2019, 1.2 on 05/08/2019, 1.4 on 08/07/2019, and 1.1 on 11/07/2019.  Beta-HCGwas 2,669 (0-3) on 11/20/2015, 2 on 12/19/2015, 1 on 12/23/2015, <1 on 01/27/2016, <1 on 02/17/2016, <1 on 03/30/2016, <1 on 05/27/2016, <1 on 07/29/2016, <1 on 10/14/2016, and 3 on 12/24/2016, <5 on 09/11/2017, <5 on 09/20/2017, <1 on 10/21/2017, <1 on 11/16/2017, <1 on 12/20/2017, <1 on 02/04/2018, < 1 on 10/05/2018, < 1 on 01/30/2019, < 1 on 05/08/2019, < 1 on 08/07/2019, and 3 on 11/07/2019.  LDHwas 522 (98-192) on 11/20/2015, 179 on 12/19/2015, 160 on 12/23/2015, 202 on 01/27/2016, 156 on  02/10/2016, 161 on 02/17/2016, 172 on 03/30/2016, 178 on 05/27/2016, 150 on 07/29/2016, 152 on 10/14/2016, 145 on 12/24/2016, 1557 on 09/11/2017, 916 on 09/20/2017, 144 on 10/21/2017, 158 on 11/16/2017, 203 on 12/20/2017, 151 on 12/27/2017, 133 on 02/04/2018,130 on 10/05/2018, 129 on 01/30/2019, 147 on 05/08/2019, 142 on 08/07/2019, and 189 on 11/07/2019.  Chest, abdomen, and pelvic CTon 09/11/2017 revealed enlarged and morphologically abnormal 4.3 x 4.9 cm left pelvic sidewall and retroperitoneal lymph nodes (measuring up to 2.9 cm). 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 There was anasarca/swelling of the left lower extremity. There was no evidence of metastatic disease in the chest.  Head MRIon 09/13/2017 revealed no evidence of metastatic disease.  He has a left lower extremity DVT. Bilateral lower extremity duplexon 09/10/2017 revealed evidence of obstruction in the left CFV, SFJ, and proximal femoral vein. He is on Xarelto.  He received 4 cycles of TIPchemotherapy(09/21/2017 - 12/27/2017). Cycle #1 was [facility] at [facility]. Cycles #2 and 3 were administered at [facility]. Both cycles 1 and 2 were complicated by lower extremity skin lesions on recovery of counts. 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 Patient received daily Zarxioinjections from 01/03/2018 - 01/12/2018.  PET scanon 02/03/2018 revealed left common and external iliac adenopathywithonly low-grade activity, less than that of the blood pool. The dominant lymph node is an external iliac node measuring 1.8 x 2.4cm(previously 2.3x 3.2cm).There was no new or enlarging adenopathy.There was low-grade activity along the left inguinal ring region near the orchectomy site, thought to be postoperative and not due to  local malignancy.  He underwentrobotic assisted laparoscopic retroperitoneal lymph  node dissectionand robotic inguinofemoral lymph node dissection on 03/05/2020at [facility]. Pathologyrevealed a left common iliac node with a large necrotic tumor deposit consistent with treatment effect (no residual viable tumor). There were 3 interaortocaval lymph nodes,9 paracaval lymph nodes, and3 periaortic lymph nodes negative for malignancy.  Chest, abdomen, and pelvis CT with contrast on 02/07/2019 revealed no acute intrathoracic, abdominal, or pelvic pathology.  There was no evidence of metastatic disease in the chest.  There was a single enlarged left external iliac chain lymph node similar in  size to the prior CT with no other adenopathy. There was interval resolution of the postoperative changes of the left groin with linear scarring with no fluid collection.  Chest, abdomen, and pelvis CT with on 08/07/2019 revealed no evidence of metastatic disease in the chest, abdomen or pelvis. The previously described left pelvic sidewall fluid density focus had resolved.  Audiogramon 10/27/2017 revealed hearing within normal limits.  He underwent IVC filterplacement, percutaneous angioplastyand stentplacementof the left common iliac vein and left external iliac vein on 10/13/2017.He underwent an IVC filter removalon 01/08/2020and replacementon 04/12/2018. He remains on Xarelto.  Hypercoagulable work-upon 10/12/2017 negative for the following:Factor V Leiden, prothrombin gene mutation,lupus anticoagulant, anticardiolipin antibodies, beta-2 glycoprotein, protein C activity/antigen, protein S activity/antigen. ATIIIwas54% (75-120) on heparin.  Symptomatically, he denies any complaint.  Exam is unremarkable.  Creatinine 1.27.  Plan: 1.   Labs today: CBC with diff, CMP, LDH, beta hCG, AFP. 2.Recurrent testicular cancer Patient completed 4 cycles of TIP chemotherapy.  He underwent  retroperitoneal lymph node dissection on03/07/2018. He had 1 necrotic lymph node with no viable tumor. Chest, abdomen, pelvic CT scan on 08/07/2019 was personally reviewed.  Agree with radiology interpretation.    No evidence of recurrent disease.             AFP, beta-HCG, and LDH are normal.   HCG has increased from < 1 to 3 (ULN).  Discuss ongoing follow-up:   Year 2: H&P and markers every 3 months and scans every 6 months.  Schedule follow-up CT scans in 01/2020. 3. LEFT lower extremity DVT Patient is as/preplacement of the IVC filter. He is at risk for thrombosis. Continue Xarelto. 4.Chemotherapy-induced neuropathy Patient has a stable neuropathy in his feet. Continue gabapentin. 5.   Chest, abdomen and pelvic CT scans on 02/06/2020. 6.   RTC on 11/23 as scheduled for labs. 7.   RTC on 02/07/2020 for MD assessment as scheduled.  Addendum: Patient will have his creatinine and tumor markers rechecked in 2 weeks (11/22/2019).  I discussed the assessment and treatment plan with the patient.  The patient was provided an opportunity to ask questions and all were answered.  The patient agreed with the plan and demonstrated an understanding of the instructions.  The patient was advised to call back if the symptoms worsen or if the condition fails to improve as anticipated.   [name], MD, PhD 11/07/2019, 5:00PM  I, De Burrs, am acting as Education administrator for [employer]. Mike Gip, MD, PhD.  I, Janalee Grobe C. Mike Gip, MD, have reviewed the above documentation for accuracy and completeness, and I agree with the above.

## 2019-11-07 ENCOUNTER — Inpatient Hospital Stay (HOSPITAL_BASED_OUTPATIENT_CLINIC_OR_DEPARTMENT_OTHER): Payer: Medicaid Other | Admitting: Hematology and Oncology

## 2019-11-07 ENCOUNTER — Other Ambulatory Visit: Payer: Medicaid Other

## 2019-11-07 ENCOUNTER — Other Ambulatory Visit: Payer: Self-pay

## 2019-11-07 ENCOUNTER — Encounter: Payer: Self-pay | Admitting: Hematology and Oncology

## 2019-11-07 ENCOUNTER — Inpatient Hospital Stay: Payer: Medicaid Other

## 2019-11-07 ENCOUNTER — Ambulatory Visit: Payer: Medicaid Other | Admitting: Hematology and Oncology

## 2019-11-07 VITALS — BP 126/79 | HR 99 | Temp 97.3°F | Resp 18 | Ht 75.0 in | Wt 281.9 lb

## 2019-11-07 DIAGNOSIS — Z7901 Long term (current) use of anticoagulants: Secondary | ICD-10-CM | POA: Diagnosis not present

## 2019-11-07 DIAGNOSIS — Z86718 Personal history of other venous thrombosis and embolism: Secondary | ICD-10-CM | POA: Diagnosis not present

## 2019-11-07 DIAGNOSIS — K219 Gastro-esophageal reflux disease without esophagitis: Secondary | ICD-10-CM | POA: Diagnosis not present

## 2019-11-07 DIAGNOSIS — F17210 Nicotine dependence, cigarettes, uncomplicated: Secondary | ICD-10-CM | POA: Diagnosis not present

## 2019-11-07 DIAGNOSIS — Z808 Family history of malignant neoplasm of other organs or systems: Secondary | ICD-10-CM | POA: Diagnosis not present

## 2019-11-07 DIAGNOSIS — T451X5A Adverse effect of antineoplastic and immunosuppressive drugs, initial encounter: Secondary | ICD-10-CM | POA: Diagnosis not present

## 2019-11-07 DIAGNOSIS — Z800 Family history of malignant neoplasm of digestive organs: Secondary | ICD-10-CM | POA: Diagnosis not present

## 2019-11-07 DIAGNOSIS — C6212 Malignant neoplasm of descended left testis: Secondary | ICD-10-CM

## 2019-11-07 DIAGNOSIS — Z95828 Presence of other vascular implants and grafts: Secondary | ICD-10-CM

## 2019-11-07 DIAGNOSIS — Z79899 Other long term (current) drug therapy: Secondary | ICD-10-CM | POA: Diagnosis not present

## 2019-11-07 DIAGNOSIS — M7989 Other specified soft tissue disorders: Secondary | ICD-10-CM | POA: Diagnosis not present

## 2019-11-07 DIAGNOSIS — Z803 Family history of malignant neoplasm of breast: Secondary | ICD-10-CM | POA: Diagnosis not present

## 2019-11-07 DIAGNOSIS — I82512 Chronic embolism and thrombosis of left femoral vein: Secondary | ICD-10-CM | POA: Diagnosis not present

## 2019-11-07 DIAGNOSIS — G620 Drug-induced polyneuropathy: Secondary | ICD-10-CM

## 2019-11-07 LAB — CBC WITH DIFFERENTIAL/PLATELET
Abs Immature Granulocytes: 0.02 10*3/uL (ref 0.00–0.07)
Basophils Absolute: 0.1 10*3/uL (ref 0.0–0.1)
Basophils Relative: 1 %
Eosinophils Absolute: 0.4 10*3/uL (ref 0.0–0.5)
Eosinophils Relative: 7 %
HCT: 41.5 % (ref 39.0–52.0)
Hemoglobin: 13.4 g/dL (ref 13.0–17.0)
Immature Granulocytes: 0 %
Lymphocytes Relative: 27 %
Lymphs Abs: 1.6 10*3/uL (ref 0.7–4.0)
MCH: 30.3 pg (ref 26.0–34.0)
MCHC: 32.3 g/dL (ref 30.0–36.0)
MCV: 93.9 fL (ref 80.0–100.0)
Monocytes Absolute: 0.7 10*3/uL (ref 0.1–1.0)
Monocytes Relative: 11 %
Neutro Abs: 3.3 10*3/uL (ref 1.7–7.7)
Neutrophils Relative %: 54 %
Platelets: 301 10*3/uL (ref 150–400)
RBC: 4.42 MIL/uL (ref 4.22–5.81)
RDW: 13.1 % (ref 11.5–15.5)
WBC: 6.1 10*3/uL (ref 4.0–10.5)
nRBC: 0 % (ref 0.0–0.2)

## 2019-11-07 LAB — COMPREHENSIVE METABOLIC PANEL
ALT: 25 U/L (ref 0–44)
AST: 20 U/L (ref 15–41)
Albumin: 4.6 g/dL (ref 3.5–5.0)
Alkaline Phosphatase: 54 U/L (ref 38–126)
Anion gap: 10 (ref 5–15)
BUN: 16 mg/dL (ref 6–20)
CO2: 26 mmol/L (ref 22–32)
Calcium: 8.9 mg/dL (ref 8.9–10.3)
Chloride: 104 mmol/L (ref 98–111)
Creatinine, Ser: 1.27 mg/dL — ABNORMAL HIGH (ref 0.61–1.24)
GFR calc Af Amer: >60 mL/min (ref >60)
GFR calc non Af Amer: >60 mL/min (ref >60)
Glucose, Bld: 118 mg/dL — ABNORMAL HIGH (ref 70–99)
Potassium: 3.7 mmol/L (ref 3.5–5.1)
Sodium: 140 mmol/L (ref 135–145)
Total Bilirubin: 0.8 mg/dL (ref 0.3–1.2)
Total Protein: 7.5 g/dL (ref 6.5–8.1)

## 2019-11-07 LAB — LACTATE DEHYDROGENASE: LDH: 189 U/L (ref 98–192)

## 2019-11-07 MED ORDER — HEPARIN SOD (PORK) LOCK FLUSH 100 UNIT/ML IV SOLN
500.0000 [IU] | Freq: Once | INTRAVENOUS | Status: AC
  Administered 2019-11-07: 500 [IU] via INTRAVENOUS
  Filled 2019-11-07: qty 5

## 2019-11-07 MED ORDER — SODIUM CHLORIDE 0.9% FLUSH
10.0000 mL | INTRAVENOUS | Status: DC | PRN
  Administered 2019-11-07: 10 mL via INTRAVENOUS
  Filled 2019-11-07: qty 10

## 2019-11-07 NOTE — Progress Notes (Signed)
No new changes noted today 

## 2019-11-08 ENCOUNTER — Telehealth: Payer: Self-pay | Admitting: Hematology and Oncology

## 2019-11-08 LAB — BETA HCG QUANT (REF LAB): hCG Quant: 3 m[IU]/mL (ref 0–3)

## 2019-11-08 LAB — AFP TUMOR MARKER: AFP, Serum, Tumor Marker: 1.1 ng/mL (ref 0.0–8.3)

## 2019-11-08 NOTE — Telephone Encounter (Signed)
I left Edgar [name] a message about his appt. In 2 weeks for a lab only. I asked that he call me back to confirm.

## 2019-11-21 ENCOUNTER — Other Ambulatory Visit: Payer: Self-pay

## 2019-11-21 DIAGNOSIS — C6212 Malignant neoplasm of descended left testis: Secondary | ICD-10-CM

## 2019-11-21 DIAGNOSIS — I82512 Chronic embolism and thrombosis of left femoral vein: Secondary | ICD-10-CM

## 2019-11-22 ENCOUNTER — Inpatient Hospital Stay: Payer: Medicaid Other | Attending: Hematology and Oncology

## 2020-02-01 ENCOUNTER — Other Ambulatory Visit: Payer: Self-pay

## 2020-02-02 ENCOUNTER — Other Ambulatory Visit: Payer: Self-pay

## 2020-02-02 DIAGNOSIS — C6212 Malignant neoplasm of descended left testis: Secondary | ICD-10-CM

## 2020-02-06 ENCOUNTER — Other Ambulatory Visit: Payer: Self-pay

## 2020-02-06 ENCOUNTER — Ambulatory Visit
Admission: RE | Admit: 2020-02-06 | Discharge: 2020-02-06 | Disposition: A | Discharge status: Home / Self Care | Payer: Medicaid Other | Source: Ambulatory Visit | Attending: Hematology and Oncology | Admitting: Hematology and Oncology

## 2020-02-06 ENCOUNTER — Inpatient Hospital Stay: Payer: Medicaid Other | Attending: Hematology and Oncology

## 2020-02-06 DIAGNOSIS — C6212 Malignant neoplasm of descended left testis: Secondary | ICD-10-CM

## 2020-02-06 DIAGNOSIS — Z95828 Presence of other vascular implants and grafts: Secondary | ICD-10-CM

## 2020-02-06 LAB — COMPREHENSIVE METABOLIC PANEL
ALT: 26 U/L (ref 0–44)
AST: 19 U/L (ref 15–41)
Albumin: 4.7 g/dL (ref 3.5–5.0)
Alkaline Phosphatase: 54 U/L (ref 38–126)
Anion gap: 11 (ref 5–15)
BUN: 20 mg/dL (ref 6–20)
CO2: 23 mmol/L (ref 22–32)
Calcium: 9.2 mg/dL (ref 8.9–10.3)
Chloride: 104 mmol/L (ref 98–111)
Creatinine, Ser: 1.09 mg/dL (ref 0.61–1.24)
GFR, Estimated: >60 mL/min (ref >60)
Glucose, Bld: 97 mg/dL (ref 70–99)
Potassium: 3.7 mmol/L (ref 3.5–5.1)
Sodium: 138 mmol/L (ref 135–145)
Total Bilirubin: 0.7 mg/dL (ref 0.3–1.2)
Total Protein: 7.6 g/dL (ref 6.5–8.1)

## 2020-02-06 LAB — CBC WITH DIFFERENTIAL/PLATELET
Abs Immature Granulocytes: 0.02 10*3/uL (ref 0.00–0.07)
Basophils Absolute: 0.1 10*3/uL (ref 0.0–0.1)
Basophils Relative: 1 %
Eosinophils Absolute: 0.2 10*3/uL (ref 0.0–0.5)
Eosinophils Relative: 2 %
HCT: 44 % (ref 39.0–52.0)
Hemoglobin: 14.5 g/dL (ref 13.0–17.0)
Immature Granulocytes: 0 %
Lymphocytes Relative: 16 %
Lymphs Abs: 1.5 10*3/uL (ref 0.7–4.0)
MCH: 30.5 pg (ref 26.0–34.0)
MCHC: 33 g/dL (ref 30.0–36.0)
MCV: 92.4 fL (ref 80.0–100.0)
Monocytes Absolute: 0.7 10*3/uL (ref 0.1–1.0)
Monocytes Relative: 7 %
Neutro Abs: 7 10*3/uL (ref 1.7–7.7)
Neutrophils Relative %: 74 %
Platelets: 339 10*3/uL (ref 150–400)
RBC: 4.76 MIL/uL (ref 4.22–5.81)
RDW: 13.1 % (ref 11.5–15.5)
WBC: 9.5 10*3/uL (ref 4.0–10.5)
nRBC: 0 % (ref 0.0–0.2)

## 2020-02-06 LAB — LACTATE DEHYDROGENASE: LDH: 144 U/L (ref 98–192)

## 2020-02-06 MED ORDER — IOHEXOL 300 MG/ML  SOLN
150.0000 mL | Freq: Once | INTRAMUSCULAR | Status: AC | PRN
  Administered 2020-02-06: 125 mL via INTRAVENOUS

## 2020-02-06 MED ORDER — HEPARIN SOD (PORK) LOCK FLUSH 100 UNIT/ML IV SOLN
500.0000 [IU] | Freq: Once | INTRAVENOUS | Status: AC
  Administered 2020-02-06: 500 [IU] via INTRAVENOUS
  Filled 2020-02-06: qty 5

## 2020-02-06 MED ORDER — SODIUM CHLORIDE 0.9% FLUSH
10.0000 mL | INTRAVENOUS | Status: DC | PRN
  Administered 2020-02-06: 10 mL via INTRAVENOUS
  Filled 2020-02-06: qty 10

## 2020-02-07 ENCOUNTER — Inpatient Hospital Stay: Payer: Medicaid Other | Admitting: Hematology and Oncology

## 2020-02-07 ENCOUNTER — Ambulatory Visit: Payer: Medicaid Other | Admitting: Hematology and Oncology

## 2020-02-07 LAB — AFP TUMOR MARKER: AFP, Serum, Tumor Marker: 1.6 ng/mL (ref 0.0–8.3)

## 2020-02-08 LAB — BETA HCG QUANT (REF LAB): hCG Quant: <1 m[IU]/mL (ref 0–3)

## 2020-03-11 ENCOUNTER — Other Ambulatory Visit: Payer: Self-pay | Admitting: Hematology and Oncology

## 2020-03-11 DIAGNOSIS — Z86718 Personal history of other venous thrombosis and embolism: Secondary | ICD-10-CM

## 2020-03-11 DIAGNOSIS — Z7901 Long term (current) use of anticoagulants: Secondary | ICD-10-CM

## 2020-03-13 NOTE — Progress Notes (Signed)
[facility]  [address], [address] [place], [place] [address] Phone: [phone]  Fax: [phone]   Clinic Day:  03/14/2020  Referring physician: No ref. provider found  Chief Complaint: Edgar [name] is a 29 y.o. male with recurrent testicular cancer who is seen for 4 month assessment.  HPI: The patient was last seen in the medical oncology clinic on 11/07/2019. At that time, he denied any complaint.  Exam was unremarkable. Hematocrit was 41.5, hemoglobin 13.4, platelets 301,000, WBC 6,100. Creatinine was 1.27. AFP was 1.1. Beta-HCG quant was 3. LDH was 189. He continued Xarelto.   Chest, abdomen, and pelvis CT with contrast on 02/06/2020 revealed no evidence of recurrent or metastatic disease. Labs on 02/06/2020 showed a hematocrit of 44.0, hemoglobin 14.5, platelets 339,000, WBC 9,500. CMP was normal. AFP was 1.6. Beta-HCG quant was <1. LDH was 144.  He missed his appointment on 02/07/2020.  During the interim, he has been "fine." He denies pain, lumps and bumps, or any other problems. He ran out of Xarelto 2 days ago.  The patient's leg swelling has resolved. His neuropathy is worse some days than others but he notes that he has been out of gabapentin for a while. He has not noticed a big difference since he stopped taking gabapentin.   Past Medical History:  Diagnosis Date  . Asthma    AS A CHILD-NO INHALERS  . Cancer (HCC)    testicular cancer 11/2016 per pt   . DVT (deep venous thrombosis) (HCC)   . GERD (gastroesophageal reflux disease)    TUMS PRN    Past Surgical History:  Procedure Laterality Date  . ANKLE FRACTURE SURGERY Right 2004  . FINE NEEDLE ASPIRATION BIOPSY N/A 11/09/2017   Procedure: FINE NEEDLE ASPIRATION BIOPSY;  Surgeon: Renford Dills, MD;  Location: [facility]IVE [facility];  Service: Cardiovascular;  Laterality: N/A;  . IVC FILTER INSERTION    . IVC FILTER INSERTION N/A 04/12/2018   Procedure: IVC FILTER INSERTION;   Surgeon: Renford Dills, MD;  Location: [facility];  Service: Cardiovascular;  Laterality: N/A;  . IVC FILTER REMOVAL N/A 03/23/2018   Procedure: IVC FILTER REMOVAL;  Surgeon: Renford Dills, MD;  Location: [facility];  Service: Cardiovascular;  Laterality: N/A;  . ORCHIECTOMY Left 11/26/2015   Procedure: ORCHIECTOMY;  Surgeon: Vanna Scotland, MD;  Location: [facility] OR[facility];  Service: Urology;  Laterality: Left;  radical/Inguinal approach  . PERIPHERAL VASCULAR CATHETERIZATION N/A 12/16/2015   Procedure: Shelda Pal Cath Insertion;  Surgeon: Annice Needy, MD;  Location: [facility];  Service: Cardiovascular;  Laterality: N/A;  . PERIPHERAL VASCULAR THROMBECTOMY Left 10/13/2017   Procedure: PERIPHERAL VASCULAR THROMBECTOMY;  Surgeon: Renford Dills, MD;  Location: [facility];  Service: Cardiovascular;  Laterality: Left;  . WISDOM TOOTH EXTRACTION  2017    Family History  Problem Relation Age of Onset  . Skin cancer Mother   . Breast cancer Maternal Grandmother   . Colon cancer Maternal Grandfather   . Diabetes Maternal Grandfather   . Emphysema Father   . Mental illness Sister        anxiety  . Stroke Paternal Grandmother   . Prostate cancer Neg Hx   . Kidney cancer Neg Hx   . Bladder Cancer Neg Hx     Social History:  reports that he has been smoking cigarettes. He has a 4.00 pack-year smoking history. He has quit using smokeless tobacco.  His smokeless  tobacco use included snuff. He reports previous drug use. Drug: Marijuana. He reports that he does not drink alcohol. He smokes 1/2 pack a day. He smokes marijuana.He has a 10-year-old daughter named [name]. His daughter is home schooled. The patient's mother's cell phone number is [phone]. Another contact number is [phone].The patient lives in [place] but is now working in [place]. He started working at [employer] 40 hours a week. 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 The  patient is alone today.  Allergies: No Known Allergies  Current Medications: Current Outpatient Medications  Medication Sig Dispense Refill  . acetaminophen (TYLENOL) 325 MG tablet Take 650 mg by mouth every 6 (six) hours as needed.    . gabapentin (NEURONTIN) 300 MG capsule TAKE 1 CAPSULE BY MOUTH EVERYDAY AT BEDTIME 30 capsule 2  . rivaroxaban (XARELTO) 20 MG TABS tablet Take 1 tablet (20 mg total) by mouth daily with supper. 30 tablet 2  . XARELTO 20 MG TABS tablet TAKE 1 TABLET BY MOUTH EVERY DAY (Patient not taking: No sig reported) 30 tablet 0   No current facility-administered medications for this visit.   Facility-Administered Medications Ordered in Other Visits  Medication Dose Route Frequency Provider Last Rate Last Admin  . filgrastim-sndz (ZARXIO) injection 480 mcg  480 mcg Subcutaneous Once [name], MD      . Karle Barr [facility]) injection 480 mcg  480 mcg Subcutaneous Once [name], MD        Review of Systems  Constitutional: Negative.  Negative for chills, diaphoresis, fever, malaise/fatigue and weight loss (up 11 lbs).       Feels "fine".  HENT: Negative.  Negative for congestion, ear discharge, ear pain, hearing loss, nosebleeds, sinus pain, sore throat and tinnitus.   Eyes: Negative.  Negative for blurred vision and double vision.  Respiratory: Negative.  Negative for cough, hemoptysis, sputum production and shortness of breath.   Cardiovascular: Negative for chest pain, palpitations, orthopnea, leg swelling and PND.  Gastrointestinal: Negative.  Negative for abdominal pain, blood in stool, constipation, diarrhea, heartburn, melena, nausea and vomiting.       Eating well.  Genitourinary: Negative.  Negative for dysuria, frequency, hematuria and urgency.  Musculoskeletal: Negative.  Negative for back pain, falls, joint pain, myalgias and neck pain.  Skin: Negative.  Negative for itching and rash.  Neurological: Positive for sensory change  (neuropathy in feet, stable). Negative for dizziness, tingling, speech change, focal weakness, weakness and headaches.  Endo/Heme/Allergies: Does not bruise/bleed easily.  Psychiatric/Behavioral: Negative.  Negative for depression, memory loss and suicidal ideas. The patient is not nervous/anxious and does not have insomnia.   All other systems reviewed and are negative.  Performance status (ECOG):  0  Vitals Blood pressure (!) 135/96, pulse 85, temperature (!) 96.7 F (35.9 C), temperature source Tympanic, resp. rate 16, weight 292 lb 8.8 oz (132.7 kg), SpO2 100 %.   Physical Exam Vitals and nursing note reviewed.  Constitutional:      General: He is not in acute distress.    Appearance: Normal appearance. He is well-developed. He is not diaphoretic.  HENT:     Head: Normocephalic and atraumatic.     Comments: Brown hair and beard.    Mouth/Throat:     Mouth: Mucous membranes are moist.     Pharynx: Oropharynx is clear.  Eyes:     General: No scleral icterus.    Extraocular Movements: Extraocular movements intact.     Conjunctiva/sclera: Conjunctivae normal.  Pupils: Pupils are equal, round, and reactive to light.  Cardiovascular:     Rate and Rhythm: Normal rate and regular rhythm.     Heart sounds: Normal heart sounds. No murmur heard.   Pulmonary:     Effort: Pulmonary effort is normal. No respiratory distress.     Breath sounds: Normal breath sounds. No stridor. No wheezing or rales.  Chest:     Chest wall: No tenderness.  Breasts:     Right: No axillary adenopathy or supraclavicular adenopathy.     Left: No axillary adenopathy or supraclavicular adenopathy.    Abdominal:     General: Bowel sounds are normal. There is no distension.     Palpations: Abdomen is soft. There is no mass.     Tenderness: There is no abdominal tenderness. There is no guarding or rebound.  Genitourinary:    Testes:        Right: Mass or swelling not present.     Comments: Left testes  absent. Musculoskeletal:        General: No swelling or tenderness. Normal range of motion.     Cervical back: Normal range of motion and neck supple.  Lymphadenopathy:     Head:     Right side of head: No preauricular, posterior auricular or occipital adenopathy.     Left side of head: No preauricular, posterior auricular or occipital adenopathy.     Cervical: No cervical adenopathy.     Upper Body:     Right upper body: No supraclavicular or axillary adenopathy.     Left upper body: No supraclavicular or axillary adenopathy.     Lower Body: No right inguinal adenopathy. No left inguinal adenopathy.  Skin:    General: Skin is warm and dry.     Coloration: Skin is not pale.  Neurological:     Mental Status: He is alert and oriented to person, place, and time. Mental status is at baseline.  Psychiatric:        Behavior: Behavior normal.        Thought Content: Thought content normal.        Judgment: Judgment normal.    No visits with results within 3 Day(s) from this visit.  Latest known visit with results is:  Infusion on 02/06/2020  Component Date Value Ref Range Status  . AFP, Serum, Tumor Marker 02/06/2020 1.6  0.0 - 8.3 ng/mL Final   Comment: (NOTE) Roche [employer] Electrochemiluminescence Immunoassay (ECLIA) Values obtained with different assay methods or kits cannot be used interchangeably.  Results cannot be interpreted as absolute evidence of the presence or absence of malignant disease. This test is not interpretable in pregnant females. Performed At: [facility] [place], [place] [id number] Rush Farmer MD [id number]   . hCG Quant 02/06/2020 <1  0 - 3 mIU/mL Final   Comment: (NOTE) Roche ECLIA methodology Performed At: [facility] [place], [place] [id number] Rush Farmer MD [id number]   . LDH 02/06/2020 144  98 - 192 U/L Final   Performed at [facility], [address], [place],  [place] [address]  . Sodium 02/06/2020 138  135 - 145 mmol/L Final  . Potassium 02/06/2020 3.7  3.5 - 5.1 mmol/L Final  . Chloride 02/06/2020 104  98 - 111 mmol/L Final  . CO2 02/06/2020 23  22 - 32 mmol/L Final  . Glucose, Bld 02/06/2020 97  70 - 99 mg/dL Final   Glucose reference range applies only to  samples taken after fasting for at least 8 hours.  . BUN 02/06/2020 20  6 - 20 mg/dL Final  . Creatinine, Ser 02/06/2020 1.09  0.61 - 1.24 mg/dL Final  . Calcium 02/06/2020 9.2  8.9 - 10.3 mg/dL Final  . Total Protein 02/06/2020 7.6  6.5 - 8.1 g/dL Final  . Albumin 02/06/2020 4.7  3.5 - 5.0 g/dL Final  . AST 02/06/2020 19  15 - 41 U/L Final  . ALT 02/06/2020 26  0 - 44 U/L Final  . Alkaline Phosphatase 02/06/2020 54  38 - 126 U/L Final  . Total Bilirubin 02/06/2020 0.7  0.3 - 1.2 mg/dL Final  . GFR, Estimated 02/06/2020 >60  >60 mL/min Final   Comment: (NOTE) Calculated using the CKD-EPI Creatinine Equation (2021)   . Anion gap 02/06/2020 11  5 - 15 Final   Performed at [facility], [address], [place], [place] [address]  . WBC 02/06/2020 9.5  4.0 - 10.5 K/uL Final  . RBC 02/06/2020 4.76  4.22 - 5.81 MIL/uL Final  . Hemoglobin 02/06/2020 14.5  13.0 - 17.0 g/dL Final  . HCT 02/06/2020 44.0  39.0 - 52.0 % Final  . MCV 02/06/2020 92.4  80.0 - 100.0 fL Final  . MCH 02/06/2020 30.5  26.0 - 34.0 pg Final  . MCHC 02/06/2020 33.0  30.0 - 36.0 g/dL Final  . RDW 02/06/2020 13.1  11.5 - 15.5 % Final  . Platelets 02/06/2020 339  150 - 400 K/uL Final  . nRBC 02/06/2020 0.0  0.0 - 0.2 % Final  . Neutrophils Relative % 02/06/2020 74  % Final  . Neutro Abs 02/06/2020 7.0  1.7 - 7.7 K/uL Final  . Lymphocytes Relative 02/06/2020 16  % Final  . Lymphs Abs 02/06/2020 1.5  0.7 - 4.0 K/uL Final  . Monocytes Relative 02/06/2020 7  % Final  . Monocytes Absolute 02/06/2020 0.7  0.1 - 1.0 K/uL Final  . Eosinophils Relative 02/06/2020 2  % Final  . Eosinophils Absolute 02/06/2020 0.2  0.0 - 0.5  K/uL Final  . Basophils Relative 02/06/2020 1  % Final  . Basophils Absolute 02/06/2020 0.1  0.0 - 0.1 K/uL Final  . Immature Granulocytes 02/06/2020 0  % Final  . Abs Immature Granulocytes 02/06/2020 0.02  0.00 - 0.07 K/uL Final   Performed at [facility], [address], [place], [place] [address]    Assessment:  YOVAN MINNICK is a 29 y.o. male with recurrent testicular cancer. He presented with a left lower extremity DVT on 09/10/2017.  He initially presented withstage IIB left testicular cancers/p left radical orchiectomy on 11/26/2015. 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 Clinical/pathologic stage is T1 N1-2 S0 M0.  He received 3 cycles of BEP(12/30/2015 - 02/24/2016). He received OnPro Neulasta with cycle #2 and #3. He declined evaluation for retroperitoneal lymph node dissection (RPLND). He declined sperm banking.  Pre surgery labson 11/20/2015 revealed an AFP of 411.9, beta-HCG 2,669, and LDH 522 (121-224).   Tumor markers have been followed: AFP was 411.9 (0-8.3) on 11/20/2015, 11.2 on 12/19/2015, 6.3 on 12/23/2015, 1.3 on 01/27/2016, 1.8 on 02/17/2016, 1.9 on 03/30/2016, 1.4 on 05/27/2016, 1.0 on 07/29/2016, 1.4 on 10/14/2016, 3.3 on 12/24/2016, 1 on 09/10/2017, 1 on 09/20/2017, 2.6 on 10/21/2017, 2.1 on 11/16/2017, 2.1 on 12/20/2017, 2.1 on 02/04/2018, 1.3 on 10/05/2018, 1.2 on 01/30/2019, 1.2 on 05/08/2019,  1.4 on 08/07/2019, 1.1 on 11/07/2019, and 1.6 on 02/06/2020.  Beta-HCGwas 2,669 (0-3) on 11/20/2015, 2 on 12/19/2015, 1 on 12/23/2015, <1 on 01/27/2016, <1 on 02/17/2016, <1 on 03/30/2016, <1 on 05/27/2016, <1 on 07/29/2016, <1 on 10/14/2016, and 3 on 12/24/2016, <5 on 09/11/2017, <5 on 09/20/2017, <1 on 10/21/2017, <1 on 11/16/2017, <1 on 12/20/2017, <1 on 02/04/2018, < 1 on 10/05/2018, < 1 on 01/30/2019, < 1  on 05/08/2019, < 1 on 08/07/2019, 3 on 11/07/2019, and < 1 on 02/06/2020.  LDHwas 522 (98-192) on 11/20/2015, 179 on 12/19/2015, 160 on 12/23/2015, 202 on 01/27/2016, 156 on 02/10/2016, 161 on 02/17/2016, 172 on 03/30/2016, 178 on 05/27/2016, 150 on 07/29/2016, 152 on 10/14/2016, 145 on 12/24/2016, 1557 on 09/11/2017, 916 on 09/20/2017, 144 on 10/21/2017, 158 on 11/16/2017, 203 on 12/20/2017, 151 on 12/27/2017, 133 on 02/04/2018,130 on 10/05/2018, 129 on 01/30/2019, 147 on 05/08/2019, 142 on 08/07/2019, 189 on 11/07/2019, and 144 on 02/06/2020.  Chest, abdomen, and pelvic CTon 09/11/2017 revealed enlarged and morphologically abnormal 4.3 x 4.9 cm left pelvic sidewall and retroperitoneal lymph nodes (measuring up to 2.9 cm). 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 There was anasarca/swelling of the left lower extremity. There was no evidence of metastatic disease in the chest.  Head MRIon 09/13/2017 revealed no evidence of metastatic disease.  He has a left lower extremity DVT. Bilateral lower extremity duplexon 09/10/2017 revealed evidence of obstruction in the left CFV, SFJ, and proximal femoral vein. He is on Xarelto.  He received 4 cycles of TIPchemotherapy(09/21/2017 - 12/27/2017). Cycle #1 was administered at [facility]. Cycles #2 and 3 were administered at [facility]. Both cycles 1 and 2 were complicated by lower extremity skin lesions on recovery of counts. 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 Patient received daily Zarxioinjections from 01/03/2018 - 01/12/2018.  PET scanon 02/03/2018 revealed left common and external iliac adenopathywithonly low-grade activity, less than that of the blood pool. The dominant lymph node is an external iliac node measuring 1.8 x  2.4cm(previously 2.3x 3.2cm).There was no new or enlarging adenopathy.There was low-grade activity along the left inguinal ring region near the orchectomy site, thought to be postoperative and not due to local malignancy.  He underwentrobotic assisted laparoscopic retroperitoneal lymph node dissectionand robotic inguinofemoral lymph node dissection on 03/05/2020at [facility]. Pathologyrevealed a left common iliac node with a large necrotic tumor deposit consistent with treatment effect (no residual viable tumor). There were 3 interaortocaval lymph nodes,9 paracaval lymph nodes, and3 periaortic lymph nodes negative for malignancy.  Chest, abdomen, and pelvis CT with contrast on 02/07/2019 revealed no acute intrathoracic, abdominal, or pelvic pathology.  There was no evidence of metastatic disease in the chest.  There was a single enlarged left external iliac chain lymph node similar in  size to the prior CT with no other adenopathy. There was interval resolution of the postoperative changes of the left groin with linear scarring with no fluid collection.  Chest, abdomen, and pelvis CT on 08/07/2019 revealed no evidence of metastatic disease in the chest, abdomen or pelvis. The previously described left pelvic sidewall fluid density focus had resolved.  Chest, abdomen, and pelvis CT on 02/06/2020 revealed no evidence of recurrent or metastatic disease.   Audiogramon 10/27/2017 revealed hearing within normal limits.  He underwent IVC filterplacement, percutaneous angioplastyand stentplacementof the left common iliac vein and left external iliac vein on 10/13/2017.He underwent an IVC filter  removalon 01/08/2020and replacementon 04/12/2018. He remains on Xarelto.  Hypercoagulable work-upon 10/12/2017 negative for the following:Factor V Leiden, prothrombin gene mutation,lupus anticoagulant, anticardiolipin antibodies, beta-2 glycoprotein, protein C activity/antigen, protein S  activity/antigen. ATIIIwas54% (75-120) on heparin.  Symptomatically, he has been "fine." He denies any concerns.  His neuropathy is worse some days than others; he has not noticed a big difference since he stopped taking gabapentin.  Exam is unremarkable.  Plan: 1.   Review labs from 02/06/2020. 2.Recurrent testicular cancer Patient completed 4 cycles of TIP chemotherapy.  He underwent retroperitoneal lymph node dissection on03/07/2018. He had 1 necrotic lymph node with no viable tumor. Chest, abdomen, pelvic CT scan on 08/07/2019 was personally reviewed.  Agree with radiology interpretation.    No evidence of recurrent disease.             AFP, beta-hCG and LDH are normal  Continue surveillance:   Year 2: H&P and markers every 3 months and scans every 6 months.  Schedule chest, abdomen and pelvis CT on 08/05/2020. 3. LEFT lower extremity DVT Patient is as/preplacement of the IVC filter. He is at risk for thrombosis. He ran out of his Xarelto 2 days ago. Discuss importance of continued anticoagulation. Rx:  Xarelto 20 mg p.o. daily (dispense #30 with 2 refills). 4.Chemotherapy-induced neuropathy  He has a waxing and waning neuropathy in his feet.  He notes no difference whether he is on gabapentin or not.  Continue to monitor off gabapentin. 5.   Hypertension  RN:  Recheck BP. 6.   Port flush every 12 weeks (due with next lab draw). 7.   Chest, abdomen, pelvis CT on 08/05/2020. 8.   RTC in 2 months for labs (CBC with diff, CMP, LDH, AFP, beta-HCG). 9.   RTC in 3 months for MD assessment, review of labs, and refill of Xarelto.  I discussed the assessment and treatment plan with the patient.  The patient was provided an opportunity to ask questions and all were answered.  The patient agreed with the plan and demonstrated an understanding of the instructions.  The patient was advised to call back if the symptoms worsen or if the condition fails to  improve as anticipated.   [name], MD, PhD 03/14/2020, 5:03 PM   I, De Burrs, am acting as scribe for [employer]. Mike Gip, MD, PhD.  I, Sanjuanita Condrey C. Mike Gip, MD, have reviewed the above documentation for accuracy and completeness, and I agree with the above.

## 2020-03-14 ENCOUNTER — Inpatient Hospital Stay: Payer: Medicaid Other | Attending: Hematology and Oncology | Admitting: Hematology and Oncology

## 2020-03-14 ENCOUNTER — Other Ambulatory Visit: Payer: Self-pay

## 2020-03-14 ENCOUNTER — Encounter: Payer: Self-pay | Admitting: Hematology and Oncology

## 2020-03-14 VITALS — BP 135/96 | HR 85 | Temp 96.7°F | Resp 16 | Wt 292.6 lb

## 2020-03-14 DIAGNOSIS — I10 Essential (primary) hypertension: Secondary | ICD-10-CM | POA: Diagnosis not present

## 2020-03-14 DIAGNOSIS — C6212 Malignant neoplasm of descended left testis: Secondary | ICD-10-CM | POA: Diagnosis not present

## 2020-03-14 DIAGNOSIS — K219 Gastro-esophageal reflux disease without esophagitis: Secondary | ICD-10-CM | POA: Diagnosis not present

## 2020-03-14 DIAGNOSIS — T451X5A Adverse effect of antineoplastic and immunosuppressive drugs, initial encounter: Secondary | ICD-10-CM | POA: Insufficient documentation

## 2020-03-14 DIAGNOSIS — Z808 Family history of malignant neoplasm of other organs or systems: Secondary | ICD-10-CM | POA: Insufficient documentation

## 2020-03-14 DIAGNOSIS — Z7901 Long term (current) use of anticoagulants: Secondary | ICD-10-CM | POA: Diagnosis not present

## 2020-03-14 DIAGNOSIS — Z86718 Personal history of other venous thrombosis and embolism: Secondary | ICD-10-CM | POA: Insufficient documentation

## 2020-03-14 DIAGNOSIS — Z800 Family history of malignant neoplasm of digestive organs: Secondary | ICD-10-CM | POA: Diagnosis not present

## 2020-03-14 DIAGNOSIS — G620 Drug-induced polyneuropathy: Secondary | ICD-10-CM | POA: Diagnosis not present

## 2020-03-14 DIAGNOSIS — Z803 Family history of malignant neoplasm of breast: Secondary | ICD-10-CM | POA: Diagnosis not present

## 2020-03-14 DIAGNOSIS — I82412 Acute embolism and thrombosis of left femoral vein: Secondary | ICD-10-CM

## 2020-03-14 DIAGNOSIS — F17210 Nicotine dependence, cigarettes, uncomplicated: Secondary | ICD-10-CM | POA: Insufficient documentation

## 2020-03-14 MED ORDER — RIVAROXABAN 20 MG PO TABS: 20.0000 mg | ORAL_TABLET | Freq: Every day | ORAL | 2 refills | Status: DC

## 2020-05-09 ENCOUNTER — Inpatient Hospital Stay: Payer: Medicaid Other | Attending: Hematology and Oncology

## 2020-05-09 ENCOUNTER — Telehealth: Payer: Self-pay | Admitting: Hematology and Oncology

## 2020-05-09 NOTE — Telephone Encounter (Signed)
05/09/2020 Attempted to contact pt about missed appt for port flush. Pt's phone was answered but nobody responded when I stated the purpose of the call. It was then promptly hung up. I called back later and could still not reach pt and was unable to leave a VM  SRW

## 2020-06-12 NOTE — Progress Notes (Signed)
[facility]  [address] [place],  [address] Phone: [phone]  Fax: [phone]   Clinic Day:  06/13/2020  Referring physician: No ref. provider found  Chief Complaint: Edgar [name] is a 30 y.o. male with recurrent testicular cancer who is seen for 3 month assessment.  HPI: The patient was last seen in the medical oncology clinic on 03/14/2020. At that time, he had been "fine." He denied any concerns.  Neuropathy fluctuated; he had not noticed a big difference since he stopped taking gabapentin.  Exam was unremarkable. Labs on 02/06/2020 revealed a hematocrit of 44.0, hemoglobin 14.5, platelets 339,000, WBC 9,500. CMP was normal. hCG quant was <1. AFP was 1.6. LDH was 144. He was to return to clinic for labs in 2 months.  Chest, abdomen, and pelvis CT with contrast is scheduled for 08/06/2020.  During the interim, he has been "good." His neuropathy is stable. He is eating well. He denies chest pain, shortness of breath, urinary symptoms, abdominal symptoms, bruising, and bleeding of any kind.  He has about a week of Xarelto left.  He does not have a PCP. He states his blood pressure is always high when he first comes into the doctor's office.    Past Medical History:  Diagnosis Date  . Asthma    AS A CHILD-NO INHALERS  . Cancer ([place])    testicular cancer 11/2016 per pt   . DVT (deep venous thrombosis) ([place])   . GERD (gastroesophageal reflux disease)    TUMS PRN    Past Surgical History:  Procedure Laterality Date  . ANKLE FRACTURE SURGERY Right 2004  . FINE NEEDLE ASPIRATION BIOPSY N/A 11/09/2017   Procedure: FINE NEEDLE ASPIRATION BIOPSY;  Surgeon: [name], MD;  Location: [facility];  Service: Cardiovascular;  Laterality: N/A;  . IVC FILTER INSERTION    . IVC FILTER INSERTION N/A 04/12/2018   Procedure: IVC FILTER INSERTION;  Surgeon: [name], MD;  Location: [facility];  Service:  Cardiovascular;  Laterality: N/A;  . IVC FILTER REMOVAL N/A 03/23/2018   Procedure: IVC FILTER REMOVAL;  Surgeon: [name], MD;  Location: [facility];  Service: Cardiovascular;  Laterality: N/A;  . ORCHIECTOMY Left 11/26/2015   Procedure: ORCHIECTOMY;  Surgeon: [name], MD;  Location: [facility];  Service: Urology;  Laterality: Left;  radical/Inguinal approach  . PERIPHERAL VASCULAR CATHETERIZATION N/A 12/16/2015   Procedure: [name] Cath Insertion;  Surgeon: [name], MD;  Location: [facility];  Service: Cardiovascular;  Laterality: N/A;  . PERIPHERAL VASCULAR THROMBECTOMY Left 10/13/2017   Procedure: PERIPHERAL VASCULAR THROMBECTOMY;  Surgeon: [name], MD;  Location: [facility];  Service: Cardiovascular;  Laterality: Left;  . WISDOM TOOTH EXTRACTION  2017    Family History  Problem Relation Age of Onset  . Skin cancer Mother   . Breast cancer Maternal Grandmother   . Colon cancer Maternal Grandfather   . Diabetes Maternal Grandfather   . Emphysema Father   . Mental illness Sister        anxiety  . Stroke Paternal Grandmother   . Prostate cancer Neg Hx   . Kidney cancer Neg Hx   . Bladder Cancer Neg Hx     Social History:  reports that he has been smoking cigarettes. He has a 4.00 pack-year smoking history. He has quit using smokeless tobacco.  His smokeless tobacco use included snuff. He reports previous drug use.  Drug: Marijuana. He reports that he does not drink alcohol. He smokes 1/2 pack a day. He smokes marijuana.He has a 33-year-old daughter named [name]. His daughter is home schooled. The patient's mother's cell phone number is [phone]. Another contact number is [phone].The patient lives in [place] but is now working in [place]. He started working at [employer] 40 hours a week. 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 The patient is alone today.  Allergies: No Known Allergies  Current  Medications: Current Outpatient Medications  Medication Sig Dispense Refill  . acetaminophen (TYLENOL) 325 MG tablet Take 650 mg by mouth every 6 (six) hours as needed.    . gabapentin (NEURONTIN) 300 MG capsule TAKE 1 CAPSULE BY MOUTH EVERYDAY AT BEDTIME (Patient not taking: Reported on 06/13/2020) 30 capsule 2  . rivaroxaban (XARELTO) 20 MG TABS tablet Take 1 tablet (20 mg total) by mouth daily. 30 tablet 1   No current facility-administered medications for this visit.   Facility-Administered Medications Ordered in Other Visits  Medication Dose Route Frequency Provider Last Rate Last Admin  . filgrastim-sndz (ZARXIO) injection 480 mcg  480 mcg Subcutaneous Once [name], MD      . [facility]) injection 480 mcg  480 mcg Subcutaneous Once [name], MD        Review of Systems  Constitutional: Negative.  Negative for chills, diaphoresis, fever, malaise/fatigue and weight loss (up 22 lbs).       Feels "good."  HENT: Negative.  Negative for congestion, ear discharge, ear pain, hearing loss, nosebleeds, sinus pain, sore throat and tinnitus.   Eyes: Negative.  Negative for blurred vision and double vision.  Respiratory: Negative.  Negative for cough, hemoptysis, sputum production and shortness of breath.   Cardiovascular: Negative for chest pain, palpitations, orthopnea, leg swelling and PND.  Gastrointestinal: Negative.  Negative for abdominal pain, blood in stool, constipation, diarrhea, heartburn, melena, nausea and vomiting.       Eating well.  Genitourinary: Negative.  Negative for dysuria, frequency, hematuria and urgency.  Musculoskeletal: Negative.  Negative for back pain, falls, joint pain, myalgias and neck pain.  Skin: Negative.  Negative for itching and rash.  Neurological: Positive for sensory change (neuropathy in feet, stable). Negative for dizziness, tingling, speech change, focal weakness, weakness and headaches.  Endo/Heme/Allergies: Does not  bruise/bleed easily.  Psychiatric/Behavioral: Negative.  Negative for depression, memory loss and suicidal ideas. The patient is not nervous/anxious and does not have insomnia.   All other systems reviewed and are negative.  Performance status (ECOG):  0  Vitals Blood pressure (!) 145/99, pulse (!) 103, temperature 97.9 F (36.6 C), temperature source Tympanic, resp. rate 18, weight (!) 314 lb 11.3 oz (142.8 kg), SpO2 99 %.   Physical Exam Vitals and nursing note reviewed.  Constitutional:      General: He is not in acute distress.    Appearance: Normal appearance. He is well-developed. He is not diaphoretic.  HENT:     Head: Normocephalic and atraumatic.     Comments: Brown hair and beard.    Mouth/Throat:     Mouth: Mucous membranes are moist.     Pharynx: Oropharynx is clear.  Eyes:     General: No scleral icterus.    Extraocular Movements: Extraocular movements intact.     Conjunctiva/sclera: Conjunctivae normal.     Pupils: Pupils are equal, round, and reactive to light.  Cardiovascular:     Rate and Rhythm: Normal rate and regular rhythm.  Heart sounds: Normal heart sounds. No murmur heard.   Pulmonary:     Effort: Pulmonary effort is normal. No respiratory distress.     Breath sounds: Normal breath sounds. No stridor. No wheezing or rales.  Chest:     Chest wall: No tenderness.  Breasts:     Right: No axillary adenopathy or supraclavicular adenopathy.     Left: No axillary adenopathy or supraclavicular adenopathy.    Abdominal:     General: Bowel sounds are normal. There is no distension.     Palpations: Abdomen is soft. There is no mass.     Tenderness: There is no abdominal tenderness. There is no guarding or rebound.  Genitourinary:    Testes:        Right: Mass or swelling not present.     Comments: Left testes absent. Musculoskeletal:        General: No swelling or tenderness. Normal range of motion.     Cervical back: Normal range of motion and neck  supple.  Lymphadenopathy:     Head:     Right side of head: No preauricular, posterior auricular or occipital adenopathy.     Left side of head: No preauricular, posterior auricular or occipital adenopathy.     Cervical: No cervical adenopathy.     Upper Body:     Right upper body: No supraclavicular or axillary adenopathy.     Left upper body: No supraclavicular or axillary adenopathy.     Lower Body: No right inguinal adenopathy. No left inguinal adenopathy.  Skin:    General: Skin is warm and dry.     Coloration: Skin is not pale.  Neurological:     Mental Status: He is alert and oriented to person, place, and time. Mental status is at baseline.  Psychiatric:        Behavior: Behavior normal.        Thought Content: Thought content normal.        Judgment: Judgment normal.    Infusion on 06/13/2020  Component Date Value Ref Range Status  . LDH 06/13/2020 174  98 - 192 U/L Final   Performed at [facility], [address] [place], [place] [address]  . Sodium 06/13/2020 138  135 - 145 mmol/L Final  . Potassium 06/13/2020 3.9  3.5 - 5.1 mmol/L Final  . Chloride 06/13/2020 105  98 - 111 mmol/L Final  . CO2 06/13/2020 27  22 - 32 mmol/L Final  . Glucose, Bld 06/13/2020 131* 70 - 99 mg/dL Final   Glucose reference range applies only to samples taken after fasting for at least 8 hours.  . BUN 06/13/2020 18  6 - 20 mg/dL Final  . Creatinine, Ser 06/13/2020 1.22  0.61 - 1.24 mg/dL Final  . Calcium 06/13/2020 9.0  8.9 - 10.3 mg/dL Final  . Total Protein 06/13/2020 7.0  6.5 - 8.1 g/dL Final  . Albumin 06/13/2020 4.2  3.5 - 5.0 g/dL Final  . AST 06/13/2020 26  15 - 41 U/L Final  . ALT 06/13/2020 34  0 - 44 U/L Final  . Alkaline Phosphatase 06/13/2020 48  38 - 126 U/L Final  . Total Bilirubin 06/13/2020 0.6  0.3 - 1.2 mg/dL Final  . GFR, Estimated 06/13/2020 >60  >60 mL/min Final   Comment: (NOTE) Calculated using the CKD-EPI Creatinine Equation (2021)   . Anion gap  06/13/2020 6  5 - 15 Final   Performed at [facility], [place]  [address], [place] [address]  . WBC 06/13/2020 6.6  4.0 - 10.5 K/uL Final  . RBC 06/13/2020 4.69  4.22 - 5.81 MIL/uL Final  . Hemoglobin 06/13/2020 14.3  13.0 - 17.0 g/dL Final  . HCT 06/13/2020 43.2  39.0 - 52.0 % Final  . MCV 06/13/2020 92.1  80.0 - 100.0 fL Final  . MCH 06/13/2020 30.5  26.0 - 34.0 pg Final  . MCHC 06/13/2020 33.1  30.0 - 36.0 g/dL Final  . RDW 06/13/2020 13.4  11.5 - 15.5 % Final  . Platelets 06/13/2020 324  150 - 400 K/uL Final  . nRBC 06/13/2020 0.0  0.0 - 0.2 % Final  . Neutrophils Relative % 06/13/2020 57  % Final  . Neutro Abs 06/13/2020 3.8  1.7 - 7.7 K/uL Final  . Lymphocytes Relative 06/13/2020 22  % Final  . Lymphs Abs 06/13/2020 1.4  0.7 - 4.0 K/uL Final  . Monocytes Relative 06/13/2020 10  % Final  . Monocytes Absolute 06/13/2020 0.7  0.1 - 1.0 K/uL Final  . Eosinophils Relative 06/13/2020 10  % Final  . Eosinophils Absolute 06/13/2020 0.6* 0.0 - 0.5 K/uL Final  . Basophils Relative 06/13/2020 1  % Final  . Basophils Absolute 06/13/2020 0.1  0.0 - 0.1 K/uL Final  . Immature Granulocytes 06/13/2020 0  % Final  . Abs Immature Granulocytes 06/13/2020 0.01  0.00 - 0.07 K/uL Final   Performed at [facility] [address], [place], [place] [address]    Assessment:  Edgar [name] is a 30 y.o. male with recurrent testicular cancer. He presented with a left lower extremity DVT on 09/10/2017.  He initially presented withstage IIB left testicular cancers/p left radical orchiectomy on 11/26/2015. 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 Clinical/pathologic stage is T1 N1-2 S0 M0.  He received 3 cycles of BEP(12/30/2015 - 02/24/2016). He received OnPro Neulasta with cycle #2 and #3. He declined evaluation for retroperitoneal lymph node  dissection (RPLND). He declined sperm banking.  Pre surgery labson 11/20/2015 revealed an AFP of 411.9, beta-HCG 2,669, and LDH 522 (121-224).   Tumor markers have been followed: AFP was 411.9 (0-8.3) on 11/20/2015, 11.2 on 12/19/2015, 6.3 on 12/23/2015, 1.3 on 01/27/2016, 1.8 on 02/17/2016, 1.9 on 03/30/2016, 1.4 on 05/27/2016, 1.0 on 07/29/2016, 1.4 on 10/14/2016, 3.3 on 12/24/2016, 1 on 09/10/2017, 1 on 09/20/2017, 2.6 on 10/21/2017, 2.1 on 11/16/2017, 2.1 on 12/20/2017, 2.1 on 02/04/2018, 1.3 on 10/05/2018, 1.2 on 01/30/2019, 1.2 on 05/08/2019, 1.4 on 08/07/2019, 1.1 on 11/07/2019, 1.6 on 02/06/2020, and 1.6 on 06/13/2020.  Beta-HCGwas 2,669 (0-3) on 11/20/2015, 2 on 12/19/2015, 1 on 12/23/2015, <1 on 01/27/2016, <1 on 02/17/2016, <1 on 03/30/2016, <1 on 05/27/2016, <1 on 07/29/2016, <1 on 10/14/2016, and 3 on 12/24/2016, <5 on 09/11/2017, <5 on 09/20/2017, <1 on 10/21/2017, <1 on 11/16/2017, <1 on 12/20/2017, <1 on 02/04/2018, < 1 on 10/05/2018, < 1 on 01/30/2019, < 1 on 05/08/2019, < 1 on 08/07/2019, 3 on 11/07/2019, < 1 on 02/06/2020, and < 1 on 06/13/2020.  LDHwas 522 (98-192) on 11/20/2015, 179 on 12/19/2015, 160 on 12/23/2015, 202 on 01/27/2016, 156 on 02/10/2016, 161 on 02/17/2016, 172 on 03/30/2016, 178 on 05/27/2016, 150 on 07/29/2016, 152 on 10/14/2016, 145 on 12/24/2016, 1557 on 09/11/2017, 916 on 09/20/2017, 144 on 10/21/2017, 158 on 11/16/2017, 203 on 12/20/2017, 151 on 12/27/2017, 133 on 02/04/2018,130 on 10/05/2018, 129 on 01/30/2019, 147 on  05/08/2019, 142 on 08/07/2019, 189 on 11/07/2019, 144 on 02/06/2020, and 174 on 06/13/2020.  Chest, abdomen, and pelvic CTon 09/11/2017 revealed enlarged and morphologically abnormal 4.3 x 4.9 cm left pelvic sidewall and retroperitoneal lymph nodes (measuring up to 2.9 cm). 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 There was anasarca/swelling of the  left lower extremity. There was no evidence of metastatic disease in the chest.  Head MRIon 09/13/2017 revealed no evidence of metastatic disease.  He has a left lower extremity DVT. Bilateral lower extremity duplexon 09/10/2017 revealed evidence of obstruction in the left CFV, SFJ, and proximal femoral vein. He is on Xarelto.  He received 4 cycles of TIPchemotherapy(09/21/2017 - 12/27/2017). Cycle #1 was administered at [facility]. Cycles #2 and 3 were administered at [facility]. Both cycles 1 and 2 were complicated by lower extremity skin lesions on recovery of counts. 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 Patient received daily Zarxioinjections from 01/03/2018 - 01/12/2018.  PET scanon 02/03/2018 revealed left common and external iliac adenopathywithonly low-grade activity, less than that of the blood pool. The dominant lymph node is an external iliac node measuring 1.8 x 2.4cm(previously 2.3x 3.2cm).There was no new or enlarging adenopathy.There was low-grade activity along the left inguinal ring region near the orchectomy site, thought to be postoperative and not due to local malignancy.  He underwentrobotic assisted laparoscopic retroperitoneal lymph node dissectionand robotic inguinofemoral lymph node dissection on 03/05/2020at [facility]. Pathologyrevealed a left common iliac node with a large necrotic tumor deposit consistent with treatment effect (no residual viable tumor). There were 3 interaortocaval lymph nodes,9 paracaval lymph nodes, and3 periaortic lymph nodes negative for malignancy.  Chest, abdomen, and pelvis CT with contrast on 02/07/2019 revealed no acute intrathoracic, abdominal, or pelvic pathology.  There was no evidence of metastatic disease in the chest.  There was a single enlarged left external iliac chain lymph node  similar in  size to the prior CT with no other adenopathy. There was interval resolution of the postoperative changes of the left groin with linear scarring with no fluid collection.  Chest, abdomen, and pelvis CT on 08/07/2019 revealed no evidence of metastatic disease in the chest, abdomen or pelvis. The previously described left pelvic sidewall fluid density focus had resolved.  Chest, abdomen, and pelvis CT on 02/06/2020 revealed no evidence of recurrent or metastatic disease.   Audiogramon 10/27/2017 revealed hearing within normal limits.  He underwent IVC filterplacement, percutaneous angioplastyand stentplacementof the left common iliac vein and left external iliac vein on 10/13/2017.He underwent an IVC filter removalon 01/08/2020and replacementon 04/12/2018. He remains on Xarelto.  Hypercoagulable work-upon 10/12/2017 negative for the following:Factor V Leiden, prothrombin gene mutation,lupus anticoagulant, anticardiolipin antibodies, beta-2 glycoprotein, protein C activity/antigen, protein S activity/antigen. ATIIIwas54% (75-120) on heparin.  Symptomatically, he feels "good." His neuropathy is stable. He is eating well.  Exam is stable.  Plan: 1.   Labs today: CBC with diff, CMP, LDH, AFP, beta-hCG. 2.Recurrent testicular cancer Patient completed 4 cycles of TIP chemotherapy.  He underwent retroperitoneal lymph node dissection on03/07/2018. He had 1 necrotic lymph node with no viable tumor. Chest, abdomen, pelvic CT scan on 08/07/2019 was personally reviewed.  Agree with radiology interpretation.    No evidence of recurrent disease.             AFP, beta-hCG and LDH are normal today  Continue surveillance:   Year 2: H&P and markers  every 3 months and scans every 6 months.  Scheduled for chest, abdomen pelvis CT on 08/05/2020. 3. LEFT lower extremity DVT Patient iss/preplacement of the IVC filter. He is at risk for  thrombosis. Continue Xarelto. Rx:  Xarelto 20 mg p.o. daily (dispense #30 with 1 refill). 4.Chemotherapy-induced neuropathy  Neuropathy is stable.  Continue to monitor off gabapentin. 5.   Hypertension  RN:  Recheck BP. 6.   Patient has CT scans scheduled for 08/05/2020. 7.   RTC on 08/07/2020 for MD assessment, labs (CBC with diff, CMP, AFP, LDH, beta-HCG- before scan), and review of CT scans.  I discussed the assessment and treatment plan with the patient.  The patient was provided an opportunity to ask questions and all were answered.  The patient agreed with the plan and demonstrated an understanding of the instructions.  The patient was advised to call back if the symptoms worsen or if the condition fails to improve as anticipated.   [name], MD, PhD 06/13/2020, 3:34 PM   I, De Burrs, am acting as scribe for [employer]. Mike Gip, MD, PhD.  I, Gladies Sofranko C. Mike Gip, MD, have reviewed the above documentation for accuracy and completeness, and I agree with the above.

## 2020-06-13 ENCOUNTER — Inpatient Hospital Stay: Payer: Medicaid Other | Attending: Hematology and Oncology | Admitting: Hematology and Oncology

## 2020-06-13 ENCOUNTER — Inpatient Hospital Stay: Payer: Medicaid Other

## 2020-06-13 ENCOUNTER — Other Ambulatory Visit: Payer: Self-pay | Admitting: Hematology and Oncology

## 2020-06-13 ENCOUNTER — Other Ambulatory Visit: Payer: Self-pay

## 2020-06-13 VITALS — BP 142/104 | HR 103 | Temp 97.9°F | Resp 18 | Wt 314.7 lb

## 2020-06-13 DIAGNOSIS — Z803 Family history of malignant neoplasm of breast: Secondary | ICD-10-CM | POA: Insufficient documentation

## 2020-06-13 DIAGNOSIS — C6212 Malignant neoplasm of descended left testis: Secondary | ICD-10-CM

## 2020-06-13 DIAGNOSIS — Z800 Family history of malignant neoplasm of digestive organs: Secondary | ICD-10-CM | POA: Insufficient documentation

## 2020-06-13 DIAGNOSIS — Z79899 Other long term (current) drug therapy: Secondary | ICD-10-CM | POA: Insufficient documentation

## 2020-06-13 DIAGNOSIS — I82412 Acute embolism and thrombosis of left femoral vein: Secondary | ICD-10-CM | POA: Diagnosis not present

## 2020-06-13 DIAGNOSIS — Z7901 Long term (current) use of anticoagulants: Secondary | ICD-10-CM | POA: Diagnosis not present

## 2020-06-13 DIAGNOSIS — G629 Polyneuropathy, unspecified: Secondary | ICD-10-CM | POA: Insufficient documentation

## 2020-06-13 DIAGNOSIS — F17210 Nicotine dependence, cigarettes, uncomplicated: Secondary | ICD-10-CM | POA: Insufficient documentation

## 2020-06-13 DIAGNOSIS — T451X5A Adverse effect of antineoplastic and immunosuppressive drugs, initial encounter: Secondary | ICD-10-CM

## 2020-06-13 DIAGNOSIS — G620 Drug-induced polyneuropathy: Secondary | ICD-10-CM

## 2020-06-13 DIAGNOSIS — I10 Essential (primary) hypertension: Secondary | ICD-10-CM | POA: Diagnosis not present

## 2020-06-13 DIAGNOSIS — Z86718 Personal history of other venous thrombosis and embolism: Secondary | ICD-10-CM | POA: Insufficient documentation

## 2020-06-13 DIAGNOSIS — I824Y2 Acute embolism and thrombosis of unspecified deep veins of left proximal lower extremity: Secondary | ICD-10-CM

## 2020-06-13 DIAGNOSIS — K219 Gastro-esophageal reflux disease without esophagitis: Secondary | ICD-10-CM | POA: Diagnosis not present

## 2020-06-13 LAB — CBC WITH DIFFERENTIAL/PLATELET
Abs Immature Granulocytes: 0.01 10*3/uL (ref 0.00–0.07)
Basophils Absolute: 0.1 10*3/uL (ref 0.0–0.1)
Basophils Relative: 1 %
Eosinophils Absolute: 0.6 10*3/uL — ABNORMAL HIGH (ref 0.0–0.5)
Eosinophils Relative: 10 %
HCT: 43.2 % (ref 39.0–52.0)
Hemoglobin: 14.3 g/dL (ref 13.0–17.0)
Immature Granulocytes: 0 %
Lymphocytes Relative: 22 %
Lymphs Abs: 1.4 10*3/uL (ref 0.7–4.0)
MCH: 30.5 pg (ref 26.0–34.0)
MCHC: 33.1 g/dL (ref 30.0–36.0)
MCV: 92.1 fL (ref 80.0–100.0)
Monocytes Absolute: 0.7 10*3/uL (ref 0.1–1.0)
Monocytes Relative: 10 %
Neutro Abs: 3.8 10*3/uL (ref 1.7–7.7)
Neutrophils Relative %: 57 %
Platelets: 324 10*3/uL (ref 150–400)
RBC: 4.69 MIL/uL (ref 4.22–5.81)
RDW: 13.4 % (ref 11.5–15.5)
WBC: 6.6 10*3/uL (ref 4.0–10.5)
nRBC: 0 % (ref 0.0–0.2)

## 2020-06-13 LAB — COMPREHENSIVE METABOLIC PANEL
ALT: 34 U/L (ref 0–44)
AST: 26 U/L (ref 15–41)
Albumin: 4.2 g/dL (ref 3.5–5.0)
Alkaline Phosphatase: 48 U/L (ref 38–126)
Anion gap: 6 (ref 5–15)
BUN: 18 mg/dL (ref 6–20)
CO2: 27 mmol/L (ref 22–32)
Calcium: 9 mg/dL (ref 8.9–10.3)
Chloride: 105 mmol/L (ref 98–111)
Creatinine, Ser: 1.22 mg/dL (ref 0.61–1.24)
GFR, Estimated: >60 mL/min (ref >60)
Glucose, Bld: 131 mg/dL — ABNORMAL HIGH (ref 70–99)
Potassium: 3.9 mmol/L (ref 3.5–5.1)
Sodium: 138 mmol/L (ref 135–145)
Total Bilirubin: 0.6 mg/dL (ref 0.3–1.2)
Total Protein: 7 g/dL (ref 6.5–8.1)

## 2020-06-13 LAB — LACTATE DEHYDROGENASE: LDH: 174 U/L (ref 98–192)

## 2020-06-13 MED ORDER — RIVAROXABAN 20 MG PO TABS: 20.0000 mg | ORAL_TABLET | Freq: Every day | ORAL | 1 refills | Status: DC

## 2020-06-13 MED ORDER — SODIUM CHLORIDE 0.9% FLUSH
10.0000 mL | Freq: Once | INTRAVENOUS | Status: AC
  Administered 2020-06-13: 10 mL via INTRAVENOUS
  Filled 2020-06-13: qty 10

## 2020-06-13 MED ORDER — HEPARIN SOD (PORK) LOCK FLUSH 100 UNIT/ML IV SOLN
500.0000 [IU] | Freq: Once | INTRAVENOUS | Status: AC
  Administered 2020-06-13: 500 [IU] via INTRAVENOUS
  Filled 2020-06-13: qty 5

## 2020-06-13 NOTE — Progress Notes (Signed)
BP elevated during visit. Per MD pt needs to monitor BP at home, and if consistently elevated, then he needs to go to urgent care. Patient was instructed to monitor BP at home and informed of how and where to obtain BP monitor. Informed that if diastolic remains elevated above 100, then he needs to go to urgent care, as he does not have a pcp at this time. Educated on potential problems of persistent elevated BP such as risk for stroke, cardiac compliations, etc. Pt verbalized understanding.

## 2020-06-14 LAB — AFP TUMOR MARKER: AFP, Serum, Tumor Marker: 1.6 ng/mL (ref 0.0–6.9)

## 2020-06-14 LAB — BETA HCG QUANT (REF LAB): hCG Quant: <1 m[IU]/mL (ref 0–3)

## 2020-08-05 ENCOUNTER — Ambulatory Visit: Payer: Medicaid Other

## 2020-08-06 ENCOUNTER — Ambulatory Visit: Payer: Medicaid Other

## 2020-08-06 ENCOUNTER — Other Ambulatory Visit: Payer: Self-pay

## 2020-08-06 ENCOUNTER — Other Ambulatory Visit: Payer: Self-pay | Admitting: *Deleted

## 2020-08-06 ENCOUNTER — Ambulatory Visit
Admission: RE | Admit: 2020-08-06 | Discharge: 2020-08-06 | Disposition: A | Discharge status: Home / Self Care | Payer: Medicaid Other | Source: Ambulatory Visit | Attending: Hematology and Oncology | Admitting: Hematology and Oncology

## 2020-08-06 DIAGNOSIS — C6212 Malignant neoplasm of descended left testis: Secondary | ICD-10-CM | POA: Insufficient documentation

## 2020-08-07 ENCOUNTER — Other Ambulatory Visit: Payer: Medicaid Other

## 2020-08-07 ENCOUNTER — Ambulatory Visit: Payer: Medicaid Other | Admitting: Oncology

## 2020-10-02 ENCOUNTER — Other Ambulatory Visit: Payer: Self-pay | Admitting: Oncology

## 2020-10-02 ENCOUNTER — Telehealth: Payer: Self-pay | Admitting: Oncology

## 2020-10-02 ENCOUNTER — Telehealth: Payer: Self-pay | Admitting: *Deleted

## 2020-10-02 ENCOUNTER — Other Ambulatory Visit: Payer: Self-pay | Admitting: *Deleted

## 2020-10-02 DIAGNOSIS — I82412 Acute embolism and thrombosis of left femoral vein: Secondary | ICD-10-CM

## 2020-10-02 DIAGNOSIS — C6212 Malignant neoplasm of descended left testis: Secondary | ICD-10-CM

## 2020-10-02 MED ORDER — GABAPENTIN 300 MG PO CAPS: ORAL_CAPSULE | ORAL | 0 refills | Status: AC

## 2020-10-02 MED ORDER — RIVAROXABAN 20 MG PO TABS: 20.0000 mg | ORAL_TABLET | Freq: Every day | ORAL | 1 refills | Status: DC

## 2020-10-02 NOTE — Telephone Encounter (Signed)
Pt called and requested a refill on xarelto.

## 2020-10-02 NOTE — Telephone Encounter (Signed)
Called the pt. Got his voicemail and left message that dr Janese Banks will refill 1 month supply xarelto and then appt made for 7/25 and I sent the rx to [facility]. Please call me if you have any questions. And left my number

## 2020-10-02 NOTE — Telephone Encounter (Signed)
I will refill prescription for 1 month. I see him next week

## 2020-10-02 NOTE — Telephone Encounter (Signed)
Called pt and got voicemail and left message on hs cell phone to let him know that he had 5/25 appt and did not come to the appt. , not sure if he knew about it. He did have his scan. I also wanted to see if he needs refill of xarelto. I gave him my direct phone number to call me back

## 2020-10-04 ENCOUNTER — Other Ambulatory Visit: Payer: Self-pay | Admitting: *Deleted

## 2020-10-04 DIAGNOSIS — C6212 Malignant neoplasm of descended left testis: Secondary | ICD-10-CM

## 2020-10-07 ENCOUNTER — Inpatient Hospital Stay: Payer: Medicaid Other | Attending: Oncology

## 2020-10-07 ENCOUNTER — Inpatient Hospital Stay: Payer: Medicaid Other | Admitting: Oncology

## 2021-01-07 ENCOUNTER — Telehealth: Payer: Self-pay | Admitting: Oncology

## 2021-01-07 NOTE — Telephone Encounter (Signed)
Attempt made to reach patient to reschedule his missed appointments. VM not set up.

## 2021-01-13 ENCOUNTER — Other Ambulatory Visit: Payer: Self-pay | Admitting: Oncology

## 2021-01-13 DIAGNOSIS — I82412 Acute embolism and thrombosis of left femoral vein: Secondary | ICD-10-CM

## 2021-04-15 ENCOUNTER — Other Ambulatory Visit: Payer: Self-pay | Admitting: Oncology

## 2021-04-15 DIAGNOSIS — I82412 Acute embolism and thrombosis of left femoral vein: Secondary | ICD-10-CM

## 2021-04-15 NOTE — Telephone Encounter (Signed)
I havent seen him. He was supposed to see me but looks like never did? No f/u scheduled as well.

## 2021-04-17 ENCOUNTER — Inpatient Hospital Stay (HOSPITAL_BASED_OUTPATIENT_CLINIC_OR_DEPARTMENT_OTHER): Payer: Medicaid Other | Admitting: Nurse Practitioner

## 2021-04-17 ENCOUNTER — Ambulatory Visit
Admission: RE | Admit: 2021-04-17 | Discharge: 2021-04-17 | Disposition: A | Discharge status: Home / Self Care | Payer: Medicaid Other | Source: Ambulatory Visit | Attending: Nurse Practitioner | Admitting: Nurse Practitioner

## 2021-04-17 ENCOUNTER — Encounter: Payer: Self-pay | Admitting: Nurse Practitioner

## 2021-04-17 ENCOUNTER — Inpatient Hospital Stay: Payer: Medicaid Other | Attending: Nurse Practitioner | Admitting: Oncology

## 2021-04-17 ENCOUNTER — Other Ambulatory Visit: Payer: Self-pay

## 2021-04-17 VITALS — BP 159/59 | HR 100 | Temp 96.3°F | Resp 16 | Wt 277.0 lb

## 2021-04-17 DIAGNOSIS — C9212 Chronic myeloid leukemia, BCR/ABL-positive, in relapse: Secondary | ICD-10-CM | POA: Diagnosis present

## 2021-04-17 DIAGNOSIS — C6212 Malignant neoplasm of descended left testis: Secondary | ICD-10-CM

## 2021-04-17 DIAGNOSIS — T451X5A Adverse effect of antineoplastic and immunosuppressive drugs, initial encounter: Secondary | ICD-10-CM | POA: Diagnosis not present

## 2021-04-17 DIAGNOSIS — K219 Gastro-esophageal reflux disease without esophagitis: Secondary | ICD-10-CM | POA: Insufficient documentation

## 2021-04-17 DIAGNOSIS — Z803 Family history of malignant neoplasm of breast: Secondary | ICD-10-CM | POA: Diagnosis not present

## 2021-04-17 DIAGNOSIS — Z8547 Personal history of malignant neoplasm of testis: Secondary | ICD-10-CM

## 2021-04-17 DIAGNOSIS — Z7901 Long term (current) use of anticoagulants: Secondary | ICD-10-CM | POA: Diagnosis not present

## 2021-04-17 DIAGNOSIS — G620 Drug-induced polyneuropathy: Secondary | ICD-10-CM | POA: Insufficient documentation

## 2021-04-17 DIAGNOSIS — Z08 Encounter for follow-up examination after completed treatment for malignant neoplasm: Secondary | ICD-10-CM

## 2021-04-17 DIAGNOSIS — I10 Essential (primary) hypertension: Secondary | ICD-10-CM | POA: Insufficient documentation

## 2021-04-17 DIAGNOSIS — J45909 Unspecified asthma, uncomplicated: Secondary | ICD-10-CM | POA: Insufficient documentation

## 2021-04-17 DIAGNOSIS — Z800 Family history of malignant neoplasm of digestive organs: Secondary | ICD-10-CM | POA: Insufficient documentation

## 2021-04-17 DIAGNOSIS — I82412 Acute embolism and thrombosis of left femoral vein: Secondary | ICD-10-CM | POA: Diagnosis not present

## 2021-04-17 DIAGNOSIS — Z86718 Personal history of other venous thrombosis and embolism: Secondary | ICD-10-CM | POA: Insufficient documentation

## 2021-04-17 DIAGNOSIS — F17210 Nicotine dependence, cigarettes, uncomplicated: Secondary | ICD-10-CM | POA: Insufficient documentation

## 2021-04-17 LAB — CBC WITH DIFFERENTIAL/PLATELET
Abs Immature Granulocytes: 0.02 10*3/uL (ref 0.00–0.07)
Basophils Absolute: 0.1 10*3/uL (ref 0.0–0.1)
Basophils Relative: 1 %
Eosinophils Absolute: 0.3 10*3/uL (ref 0.0–0.5)
Eosinophils Relative: 5 %
HCT: 47.2 % (ref 39.0–52.0)
Hemoglobin: 15.4 g/dL (ref 13.0–17.0)
Immature Granulocytes: 0 %
Lymphocytes Relative: 21 %
Lymphs Abs: 1.3 10*3/uL (ref 0.7–4.0)
MCH: 30.1 pg (ref 26.0–34.0)
MCHC: 32.6 g/dL (ref 30.0–36.0)
MCV: 92.2 fL (ref 80.0–100.0)
Monocytes Absolute: 0.5 10*3/uL (ref 0.1–1.0)
Monocytes Relative: 8 %
Neutro Abs: 4 10*3/uL (ref 1.7–7.7)
Neutrophils Relative %: 65 %
Platelets: 310 10*3/uL (ref 150–400)
RBC: 5.12 MIL/uL (ref 4.22–5.81)
RDW: 12.9 % (ref 11.5–15.5)
WBC: 6.2 10*3/uL (ref 4.0–10.5)
nRBC: 0 % (ref 0.0–0.2)

## 2021-04-17 LAB — COMPREHENSIVE METABOLIC PANEL
ALT: 19 U/L (ref 0–44)
AST: 20 U/L (ref 15–41)
Albumin: 4.7 g/dL (ref 3.5–5.0)
Alkaline Phosphatase: 62 U/L (ref 38–126)
Anion gap: 6 (ref 5–15)
BUN: 16 mg/dL (ref 6–20)
CO2: 26 mmol/L (ref 22–32)
Calcium: 8.9 mg/dL (ref 8.9–10.3)
Chloride: 105 mmol/L (ref 98–111)
Creatinine, Ser: 1.27 mg/dL — ABNORMAL HIGH (ref 0.61–1.24)
GFR, Estimated: >60 mL/min (ref >60)
Glucose, Bld: 119 mg/dL — ABNORMAL HIGH (ref 70–99)
Potassium: 3.6 mmol/L (ref 3.5–5.1)
Sodium: 137 mmol/L (ref 135–145)
Total Bilirubin: 0.3 mg/dL (ref 0.3–1.2)
Total Protein: 7.5 g/dL (ref 6.5–8.1)

## 2021-04-17 LAB — LACTATE DEHYDROGENASE: LDH: 124 U/L (ref 98–192)

## 2021-04-17 MED ORDER — SODIUM CHLORIDE 0.9% FLUSH
10.0000 mL | Freq: Once | INTRAVENOUS | Status: AC
  Filled 2021-04-17: qty 10

## 2021-04-17 MED ORDER — HEPARIN SOD (PORK) LOCK FLUSH 100 UNIT/ML IV SOLN
500.0000 [IU] | Freq: Once | INTRAVENOUS | Status: AC
  Filled 2021-04-17: qty 5

## 2021-04-17 MED ORDER — SODIUM CHLORIDE 0.9% FLUSH
10.0000 mL | Freq: Once | INTRAVENOUS | Status: AC
  Administered 2021-04-17: 10 mL via INTRAVENOUS
  Filled 2021-04-17: qty 10

## 2021-04-17 MED ORDER — HEPARIN SOD (PORK) LOCK FLUSH 100 UNIT/ML IV SOLN
500.0000 [IU] | Freq: Once | INTRAVENOUS | Status: AC
  Administered 2021-04-17: 500 [IU] via INTRAVENOUS
  Filled 2021-04-17: qty 5

## 2021-04-17 MED ORDER — RIVAROXABAN 20 MG PO TABS: 20.0000 mg | ORAL_TABLET | Freq: Every day | ORAL | 1 refills | Status: AC

## 2021-04-17 NOTE — Progress Notes (Signed)
[place] at [facility], [place] Phone: [phone]   Clinic Day:  04/17/2021  Referring physician: No ref. provider found  Chief Complaint: recurrent testicular cancer follow up & DVT   HPI: Edgar [name] is a 31 y.o. male with recurrent testicular cancer.  He presented with a left lower extremity DVT on 09/10/2017.   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  Clinical/pathologic stage is T1 N1-2 S0 M0.   He received 3 cycles of  BEP (12/30/2015 - 02/24/2016).  He received OnPro Neulasta with cycle #2 and #3.  He declined evaluation for retroperitoneal lymph node dissection (RPLND).  He declined sperm banking.   Pre surgery labs on 11/20/2015 revealed an AFP of 411.9, beta-HCG 2,669, and LDH 522 (121-224).     Tumor markers have been followed:   AFP was 411.9 (0-8.3) on 11/20/2015, 11.2 on 12/19/2015, 6.3 on 12/23/2015, 1.3 on 01/27/2016, 1.8 on 02/17/2016, 1.9 on 03/30/2016, 1.4 on 05/27/2016, 1.0 on 07/29/2016, 1.4 on 10/14/2016, 3.3 on 12/24/2016, 1 on 09/10/2017, 1 on 09/20/2017,  2.6 on 10/21/2017, 2.1 on 11/16/2017, 2.1 on 12/20/2017, 2.1 on 02/04/2018, 1.3 on 10/05/2018, 1.2 on 01/30/2019, 1.2 on 05/08/2019, 1.4 on 08/07/2019, 1.1 on 11/07/2019, 1.6 on 02/06/2020, and 1.6 on 06/13/2020.    Beta-HCG was 2,669 (0-3) on 11/20/2015, 2 on 12/19/2015, 1 on 12/23/2015, < 1 on 01/27/2016, < 1 on 02/17/2016, < 1 on 03/30/2016, < 1 on 05/27/2016,  < 1 on 07/29/2016, < 1 on 10/14/2016, and 3 on 12/24/2016, < 5 on 09/11/2017,  < 5 on 09/20/2017, < 1 on 10/21/2017, < 1 on 11/16/2017, < 1 on 12/20/2017, < 1 on 02/04/2018, < 1 on 10/05/2018, < 1 on 01/30/2019, < 1 on 05/08/2019, < 1 on 08/07/2019, 3 on 11/07/2019, < 1 on 02/06/2020, and < 1 on  06/13/2020.   LDH was 522 (98-192) on 11/20/2015, 179 on 12/19/2015, 160 on 12/23/2015, 202 on 01/27/2016, 156 on 02/10/2016, 161 on 02/17/2016, 172 on 03/30/2016, 178 on 05/27/2016, 150 on 07/29/2016, 152 on 10/14/2016, 145 on 12/24/2016, 1557 on 09/11/2017, 916 on 09/20/2017, 144 on 10/21/2017, 158 on 11/16/2017, 203 on 12/20/2017, 151 on 12/27/2017, 133 on 02/04/2018, 130 on 10/05/2018, 129 on 01/30/2019, 147 on 05/08/2019, 142 on 08/07/2019, 189 on 11/07/2019, 144 on 02/06/2020, and 174 on 06/13/2020.   Chest, abdomen, and pelvic CT on 09/11/2017 revealed enlarged and morphologically abnormal 4.3 x 4.9 cm left pelvic sidewall and retroperitoneal lymph nodes (measuring up to 2.9 cm).  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  There was anasarca/swelling of the left lower extremity.  There was no evidence of metastatic disease in the chest.   Head MRI on 09/13/2017 revealed no evidence of metastatic disease.   He has a left lower extremity DVT.  Bilateral lower extremity duplex on 09/10/2017 revealed evidence of obstruction in the left CFV, SFJ, and proximal femoral vein.  He is on Xarelto.   He received 4 cycles of TIP chemotherapy (09/21/2017 - 12/27/2017). Cycle #1 was administered at [facility]. Cycles #2 and 3 were administered at [facility].  Both cycles 1 and 2 were complicated by lower extremity skin lesions on recovery of counts. 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  Left  calf lesion progressed to pyoderma gangrenosum.  He completed a prednisone taper in 01/2018. Patient received daily Zarxio injections from 01/03/2018 - 01/12/2018.   PET scan on 02/03/2018 revealed left common and external iliac adenopathy with only low-grade activity, less than that of the blood pool. The dominant lymph node is an external iliac node measuring 1.8 x 2.4 cm (previously 2.3 x 3.2 cm). There was no new or  enlarging adenopathy.  There was low-grade activity along the left inguinal ring region near the orchectomy site, thought to be postoperative and not due to local malignancy.   He underwent robotic assisted laparoscopic retroperitoneal lymph node dissection and robotic inguinofemoral lymph node dissection on 05/19/2018 at [facility].  Pathology revealed a left common iliac node with a large necrotic tumor deposit consistent with treatment effect (no residual viable tumor).  There were 3 interaortocaval lymph nodes, 9 paracaval lymph nodes, and 3 periaortic lymph nodes negative for malignancy.   Chest, abdomen, and pelvis CT with contrast on 02/07/2019 revealed no acute intrathoracic, abdominal, or pelvic pathology.  There was no evidence of metastatic disease in the chest.  There was a single enlarged left external iliac chain lymph node similar in  size to the prior CT with no other adenopathy. There was interval resolution of the postoperative changes of the left groin with linear scarring with no fluid collection.  Chest, abdomen, and pelvis CT on 08/07/2019 revealed no evidence of metastatic disease in the chest, abdomen or pelvis. The previously described left pelvic sidewall fluid density focus had resolved.  Chest, abdomen, and pelvis CT on 02/06/2020 revealed no evidence of recurrent or metastatic disease.    Audiogram on 10/27/2017 revealed hearing within normal limits.   He underwent IVC filter placement, percutaneous angioplasty and stent placement of the left common iliac vein and left external iliac vein on 10/13/2017.  He underwent an IVC filter removal on 03/23/2018 and replacement on 04/12/2018. He remains on Xarelto.    Hypercoagulable work-up on 10/12/2017 negative for the following: Factor V Leiden, prothrombin gene mutation, lupus anticoagulant, anticardiolipin antibodies, beta-2 glycoprotein, protein C activity/antigen, protein S activity/antigen.  ATIII was 54% (75-120) on  heparin.   Interval History: Patient is 31 year old male. He was last seen in March 2022 for surveillance. He feels well and denies complaints. He has not been seen by urology or at [facility] in interim. He continues xarelto. Performs self exams. No new lumps or bumps. He's working for a [employer] and travels frequently for work. Is engaged.       Past Medical History:  Diagnosis Date   Asthma    AS A CHILD-NO INHALERS   Cancer ([place])    testicular cancer 11/2016 per pt    DVT (deep venous thrombosis) (HCC)    GERD (gastroesophageal reflux disease)    TUMS PRN    Past Surgical History:  Procedure Laterality Date   ANKLE FRACTURE SURGERY Right 2004   FINE NEEDLE ASPIRATION BIOPSY N/A 11/09/2017   Procedure: FINE NEEDLE ASPIRATION BIOPSY;  Surgeon: [name], MD;  Location: [place] [facility];  Service: Cardiovascular;  Laterality: N/A;   IVC FILTER INSERTION     IVC FILTER INSERTION N/A 04/12/2018   Procedure: IVC FILTER INSERTION;  Surgeon: [name], MD;  Location: [place] [facility];  Service: Cardiovascular;  Laterality: N/A;   IVC FILTER REMOVAL N/A 03/23/2018   Procedure: IVC FILTER REMOVAL;  Surgeon: [name], MD;  Location: [place] [facility];  Service: Cardiovascular;  Laterality: N/A;   ORCHIECTOMY Left 11/26/2015   Procedure: ORCHIECTOMY;  Surgeon: [name], MD;  Location: [facility];  Service: Urology;  Laterality: Left;  radical/Inguinal approach   PERIPHERAL VASCULAR CATHETERIZATION N/A 12/16/2015   Procedure: [name] Cath Insertion;  Surgeon: [name], MD;  Location: [place] [facility];  Service: Cardiovascular;  Laterality: N/A;   PERIPHERAL VASCULAR THROMBECTOMY Left 10/13/2017   Procedure: PERIPHERAL VASCULAR THROMBECTOMY;  Surgeon: [name], MD;  Location: [place] [facility];  Service: Cardiovascular;  Laterality: Left;   WISDOM TOOTH EXTRACTION  2017    Family History  Problem Relation Age of Onset   Skin cancer  Mother    Breast cancer Maternal Grandmother    Colon cancer Maternal Grandfather    Diabetes Maternal Grandfather    Emphysema Father    Mental illness Sister        anxiety   Stroke Paternal Grandmother    Prostate cancer Neg Hx    Kidney cancer Neg Hx    Bladder Cancer Neg Hx     Social History:  reports that he has been smoking cigarettes. He has a 4.00 pack-year smoking history. He has quit using smokeless tobacco.  His smokeless tobacco use included snuff. He reports that he does not currently use drugs after having used the following drugs: Marijuana. He reports that he does not drink alcohol. He smokes 1/2 pack a day.  He smokes marijuana. He has a daughter named [name]. His daughter is home schooled. The patient's mother's cell phone number is [phone].  Another contact number is [phone]. The patient lives in [place]. He works for a [employer] and travels frequently for work.    Allergies: No Known Allergies  Current Medications: Current Outpatient Medications  Medication Sig Dispense Refill   acetaminophen (TYLENOL) 325 MG tablet Take 650 mg by mouth every 6 (six) hours as needed.     gabapentin (NEURONTIN) 300 MG capsule TAKE 1 CAPSULE BY MOUTH EVERYDAY AT BEDTIME 30 capsule 0   XARELTO 20 MG TABS tablet TAKE 1 TABLET BY MOUTH EVERY DAY 3 tablet 0   No current facility-administered medications for this visit.   Facility-Administered Medications Ordered in Other Visits  Medication Dose Route Frequency Provider Last Rate Last Admin   filgrastim-sndz (ZARXIO) injection 480 mcg  480 mcg Subcutaneous Once Corcoran, Drue Second, MD       filgrastim-sndz [facility]) injection 480 mcg  480 mcg Subcutaneous Once [name], MD        Review of Systems  Constitutional:  Negative for chills, fever, malaise/fatigue and weight loss.  HENT:  Negative for hearing loss, nosebleeds, sore throat and tinnitus.   Eyes:  Negative for blurred vision and double vision.   Respiratory:  Negative for cough, hemoptysis, shortness of breath and wheezing.   Cardiovascular:  Negative for chest pain, palpitations and leg swelling.  Gastrointestinal:  Negative for abdominal pain, blood in stool, constipation, diarrhea, melena, nausea and vomiting.  Genitourinary:  Negative for dysuria and urgency.  Musculoskeletal:  Negative for back pain, falls, joint pain and myalgias.  Skin:  Negative for itching and rash.  Neurological:  Negative for dizziness, tingling, sensory change, loss of consciousness, weakness and headaches.  Endo/Heme/Allergies:  Negative for environmental allergies. Does not bruise/bleed easily.  Psychiatric/Behavioral:  Negative for depression. The patient is nervous/anxious. The patient does not have insomnia.     Vitals Blood pressure (!) 159/59, pulse 100, temperature (!) 96.3 F (35.7  C), temperature source Tympanic, resp. rate 16, weight 277 lb (125.6 kg), SpO2 100 %.  Performance status (ECOG):  0   Physical Exam Constitutional:      Appearance: He is not ill-appearing.  Cardiovascular:     Rate and Rhythm: Normal rate and regular rhythm.  Pulmonary:     Effort: Pulmonary effort is normal.  Abdominal:     General: There is no distension.     Tenderness: There is no abdominal tenderness.  Genitourinary:    Testes:        Right: Mass, tenderness or swelling not present.     Comments: Left testicle surgically absent Lymphadenopathy:     Lower Body: No right inguinal adenopathy. No left inguinal adenopathy.  Skin:    Findings: No rash.  Neurological:     Mental Status: He is alert and oriented to person, place, and time.  Psychiatric:        Mood and Affect: Mood normal.        Behavior: Behavior normal.   CBC Latest Ref Rng & Units 04/17/2021 06/13/2020 02/06/2020  WBC 4.0 - 10.5 K/uL 6.2 6.6 9.5  Hemoglobin 13.0 - 17.0 g/dL 15.4 14.3 14.5  Hematocrit 39.0 - 52.0 % 47.2 43.2 44.0  Platelets 150 - 400 K/uL 310 324 339   CMP  Latest Ref Rng & Units 04/17/2021 06/13/2020 02/06/2020  Glucose 70 - 99 mg/dL 119(H) 131(H) 97  BUN 6 - 20 mg/dL 16 18 20   Creatinine 0.61 - 1.24 mg/dL 1.27(H) 1.22 1.09  Sodium 135 - 145 mmol/L 137 138 138  Potassium 3.5 - 5.1 mmol/L 3.6 3.9 3.7  Chloride 98 - 111 mmol/L 105 105 104  CO2 22 - 32 mmol/L 26 27 23   Calcium 8.9 - 10.3 mg/dL 8.9 9.0 9.2  Total Protein 6.5 - 8.1 g/dL 7.5 7.0 7.6  Total Bilirubin 0.3 - 1.2 mg/dL 0.3 0.6 0.7  Alkaline Phos 38 - 126 U/L 62 48 54  AST 15 - 41 U/L 20 26 19   ALT 0 - 44 U/L 19 34 26   Component Ref Range & Units 10 mo ago (06/13/20) 1 yr ago (02/06/20) 1 yr ago (11/07/19) 1 yr ago (08/07/19) 1 yr ago (05/08/19) 2 yr ago (01/30/19) 2 yr ago (10/05/18)  hCG Quant 0 - 3 mIU/mL <1  <1 CM  3 CM  <1 CM  <1 CM  <1 CM  <1 CM    Component Ref Range & Units 10 mo ago (06/13/20) 1 yr ago (02/06/20) 1 yr ago (11/07/19) 1 yr ago (08/07/19) 1 yr ago (05/08/19) 2 yr ago (01/30/19) 2 yr ago (10/05/18)  AFP, Serum, Tumor Marker 0.0 - 6.9 ng/mL 1.6  1.6 R, CM  1.1 R, CM  1.4 R, CM  1.2 R, CM  1.2 R, CM  1.3 R, CM    Component Ref Range & Units 10 mo ago (06/13/20) 1 yr ago (02/06/20) 1 yr ago (11/07/19) 1 yr ago (08/07/19) 1 yr ago (05/08/19) 2 yr ago (01/30/19) 2 yr ago (10/05/18)  LDH 98 - 192 U/L 174  144 CM  189 CM  142 CM  147 CM  129 CM  130 CM      Assessment & Plan:  1.   Recurrent testicular cancer- Diagnosed in 11/2015. Pathology consistent with mixed germ cell tumor: seminooma (50%), embryonal carcinoma (25%), yolk sac tumor (25%), no LVSI s/p radical orchiectomy with Dr. Erlene Quan. Received adjuvant chemotherapy. Recurrent disease 10/2017 s/p 4 cycles of TIP  chemotherapy followed by retroperitoneal LN dissection on [facility]/19/[facility] at [facility] with Dr.Bjurlin. 1 necrotic LN with no viable tumor. I independently reviewed imaging from 08/06/20 CT C/A/P WO contrast (d/t contrast shortage) which was negative for recurrent or metastatic disease. AFP, beta-HCG, and LDH were  normal at that time.  Clinically, he is asymptomatic and no evidence of disease on exam. Labs today are pending at time of visit.  We reviewed that NCCN Guidelines recommend history, physical exam, tumor markers, every 3 months for first 2 years after recurrence then every 6 months for years 3-5. CT abdomen pelvis every 6-12 months and chest xray every 6-12 months. He has not followed up at [facility] either since 2020. I recommended that he schedule visit with [facility] for surveillance. He prefers to keep care locally d/t convenience. Will order CT A/P and chest xray today. Plan to see him back in 6 months for labs, see Dr. Janese Banks.   2.   LEFT lower extremity DVT- Patient is s/p replacement of the IVC filter. He is at risk for thrombosis. Continue Xarelto. Refill provided.   3.   Chemotherapy-induced neuropathy- off gabapentin. Does not bother him or impair function.   4.   Hypertension- suspect related to anxiety. Encouraged him to check his BP at home. Reviewed normal ranges. If abnormal, discuss with PCP.   5. Smoking cessation- encouraged smoking & tobacco cessation.   Follow up: Ct abdomen pelvis next week Chest xray today 6 mo- labs (cbc, cmp, afp, ldh, b-hcg), Janese Banks- la  I discussed the assessment and treatment plan with the patient.  The patient was provided an opportunity to ask questions and all were answered.  The patient agreed with the plan and demonstrated an understanding of the instructions.  The patient was advised to call back if the symptoms worsen or if the condition fails to improve as anticipated.  Verlon Au, NP 04/17/21

## 2021-04-17 NOTE — Progress Notes (Signed)
Pt in for follow up, has lost weight since last documented in March 2022. Pt states he was not employed at that time and was not active.  Pt states has a good appetite and has been trying to lose weight.

## 2021-04-18 LAB — BETA HCG QUANT (REF LAB): hCG Quant: <1 m[IU]/mL (ref 0–3)

## 2021-04-18 LAB — AFP TUMOR MARKER: AFP, Serum, Tumor Marker: <1.8 ng/mL (ref 0.0–6.9)

## 2021-04-30 ENCOUNTER — Ambulatory Visit: Payer: Medicaid Other

## 2021-06-20 ENCOUNTER — Encounter: Payer: Self-pay | Admitting: Urgent Care

## 2021-07-18 ENCOUNTER — Inpatient Hospital Stay: Payer: Medicaid Other | Attending: Oncology

## 2021-08-06 ENCOUNTER — Encounter: Payer: Self-pay | Admitting: Urgent Care

## 2021-10-17 ENCOUNTER — Inpatient Hospital Stay: Payer: Medicaid Other | Attending: Oncology

## 2021-10-17 ENCOUNTER — Inpatient Hospital Stay: Payer: Medicaid Other | Admitting: Medical Oncology
